# Patient Record
Sex: Male | Born: 1945
Health system: Southern US, Community
[De-identification: ages and names within clinical notes are randomized; demographics above are authoritative.]

## PROBLEM LIST (undated history)

## (undated) DIAGNOSIS — I4891 Unspecified atrial fibrillation: Secondary | ICD-10-CM

## (undated) DIAGNOSIS — M1990 Unspecified osteoarthritis, unspecified site: Secondary | ICD-10-CM

## (undated) DIAGNOSIS — E785 Hyperlipidemia, unspecified: Secondary | ICD-10-CM

## (undated) DIAGNOSIS — H8109 Meniere's disease, unspecified ear: Secondary | ICD-10-CM

## (undated) DIAGNOSIS — I639 Cerebral infarction, unspecified: Secondary | ICD-10-CM

## (undated) DIAGNOSIS — H269 Unspecified cataract: Secondary | ICD-10-CM

## (undated) DIAGNOSIS — R002 Palpitations: Secondary | ICD-10-CM

## (undated) DIAGNOSIS — E669 Obesity, unspecified: Secondary | ICD-10-CM

## (undated) DIAGNOSIS — I499 Cardiac arrhythmia, unspecified: Secondary | ICD-10-CM

## (undated) DIAGNOSIS — G4730 Sleep apnea, unspecified: Secondary | ICD-10-CM

## (undated) DIAGNOSIS — I10 Essential (primary) hypertension: Secondary | ICD-10-CM

## (undated) DIAGNOSIS — Z9989 Dependence on other enabling machines and devices: Secondary | ICD-10-CM

## (undated) DIAGNOSIS — C4442 Squamous cell carcinoma of skin of scalp and neck: Secondary | ICD-10-CM

## (undated) HISTORY — DX: Palpitations: R00.2

## (undated) HISTORY — PX: KNEE ARTHROSCOPY: SHX127

## (undated) HISTORY — DX: Gilbert syndrome: E80.4

## (undated) HISTORY — DX: Unspecified atrial fibrillation: I48.91

## (undated) HISTORY — DX: Dependence on other enabling machines and devices: Z99.89

## (undated) HISTORY — DX: Unspecified cataract: H26.9

## (undated) HISTORY — DX: Squamous cell carcinoma of skin of scalp and neck: C44.42

## (undated) HISTORY — DX: Hyperlipidemia, unspecified: E78.5

## (undated) HISTORY — DX: Cerebral infarction, unspecified: I63.9

## (undated) HISTORY — DX: Obesity, unspecified: E66.9

---

## 2000-08-17 ENCOUNTER — Ambulatory Visit (HOSPITAL_BASED_OUTPATIENT_CLINIC_OR_DEPARTMENT_OTHER): Admission: RE | Admit: 2000-08-17 | Discharge: 2000-08-17 | Payer: Self-pay | Admitting: Orthopedic Surgery

## 2004-09-14 ENCOUNTER — Encounter: Payer: Self-pay | Admitting: Cardiovascular Disease

## 2004-09-14 DIAGNOSIS — G4730 Sleep apnea, unspecified: Secondary | ICD-10-CM

## 2004-09-14 HISTORY — DX: Sleep apnea, unspecified: G47.30

## 2004-11-12 ENCOUNTER — Encounter: Payer: Self-pay | Admitting: Cardiovascular Disease

## 2005-11-01 ENCOUNTER — Encounter: Admission: RE | Admit: 2005-11-01 | Discharge: 2005-11-01 | Payer: Self-pay | Admitting: Cardiovascular Disease

## 2005-11-03 ENCOUNTER — Ambulatory Visit (HOSPITAL_COMMUNITY): Admission: RE | Admit: 2005-11-03 | Discharge: 2005-11-03 | Payer: Self-pay | Admitting: Cardiovascular Disease

## 2005-11-03 HISTORY — PX: CARDIAC CATHETERIZATION: SHX172

## 2010-05-29 DIAGNOSIS — I10 Essential (primary) hypertension: Secondary | ICD-10-CM

## 2010-05-29 HISTORY — DX: Essential (primary) hypertension: I10

## 2011-05-25 ENCOUNTER — Encounter (HOSPITAL_COMMUNITY): Payer: Self-pay | Admitting: Pharmacy Technician

## 2011-05-29 NOTE — H&P (Signed)
Clayton [name] is an 66 y.o. male.    Chief Complaint: left hip OA and pain   HPI: Pt is a 66 y.o. male complaining of left hip pain for 4+ years. Pain had continually increased since the beginning, especially for the last 1.5 years. X-rays in the clinic show end-stage arthritic changes of the left hip. Pt has tried various conservative treatments which have failed to alleviate their symptoms, including a steroid injection which helps for only a couple of months. Various options are discussed with the patient. Risks, benefits and expectations were discussed with the patient. Patient understand the risks, benefits and expectations and wishes to proceed with surgery.   PCP:  No primary provider on file.  D/C Plans:  Home with HHPT  Post-op Meds:  Rx given for ASA, Robaxin, Iron, Colace and MiraLax  Tranexamic Acid:   To be given  Decadron:   To be given  PMH: HTN Sleep Apnea Hyperlipidemia Impaired hearing Arthritis  PSH: Left knee arthroscopy  Social History: Denies the use of tobacco, Admits to the occasional use of alcohol  Allergies:  No Known Allergies  Medications: Current Outpatient Prescriptions  Medication Sig Dispense Refill  . amLODipine-benazepril (LOTREL) 10-40 MG per capsule Take 1 capsule by mouth daily.      Marland Kitchen aspirin 325 MG tablet Take 325 mg by mouth daily.      . Boswellia-Glucosamine-Vit D (GLUCOSAMINE COMPLEX PO) Take by mouth.      . hydrochlorothiazide (HYDRODIURIL) 25 MG tablet Take 25 mg by mouth daily.      . meloxicam (MOBIC) 15 MG tablet Take 15 mg by mouth daily.      . metoprolol succinate (TOPROL-XL) 100 MG 24 hr tablet Take 100 mg by mouth daily. Take with or immediately following a meal.      . Multiple Vitamin (MULITIVITAMIN WITH MINERALS) TABS Take 1 tablet by mouth daily.      Clayton Ards 3-6-9 Fatty Acids (OMEGA-3-6-9 PO) Take 1 capsule by mouth daily. Omega 3= 800mg ,omega 6=380mg ,omega 9 =300mg       . vitamin C (ASCORBIC ACID) 500 MG tablet  Take 500 mg by mouth daily.      Marland Kitchen zinc gluconate 50 MG tablet Take 50 mg by mouth daily.      Marland Kitchen Zn-Pyg Afri-Nettle-Saw Palmet (SAW PALMETTO COMPLEX PO) Take 1 capsule by mouth daily.        ROS: Review of Systems  Constitutional: Negative.   HENT: Negative.   Eyes: Negative.   Respiratory: Negative.   Cardiovascular: Negative.   Gastrointestinal: Negative.   Genitourinary: Negative.   Musculoskeletal: Positive for joint pain.  Skin: Negative.   Neurological: Negative.   Endo/Heme/Allergies: Negative.   Psychiatric/Behavioral: Negative.      Physical Exam: BP:  155/98 ;  HR:  100  ;    Resp: 20   ; Physical Exam  Constitutional: He is oriented to person, place, and time and well-developed, well-nourished, and in no distress.  HENT:  Head: Normocephalic and atraumatic.  Nose: Nose normal.  Mouth/Throat: Oropharynx is clear and moist.  Eyes: Pupils are equal, round, and reactive to light.  Neck: Neck supple. No JVD present. No tracheal deviation present. No thyromegaly present.  Cardiovascular: Normal rate, regular rhythm, normal heart sounds and intact distal pulses.   Pulmonary/Chest: Effort normal and breath sounds normal.  Abdominal: Soft. There is no tenderness. There is no guarding.  Musculoskeletal:       Left hip: He exhibits decreased range of  motion, decreased strength, tenderness and bony tenderness. He exhibits no swelling, no crepitus and no laceration.  Lymphadenopathy:    He has no cervical adenopathy.  Neurological: He is alert and oriented to person, place, and time.  Skin: Skin is warm and dry.  Psychiatric: Affect normal.     Assessment/Plan Assessment: left hip OA and pain   Plan: Patient will undergo a left total hip arthroplasty, anterior approach on 06/08/2011 per Dr. Charlann Boxer at [facility]. Risks benefits and expectation were discussed with the patient. Patient understand risks, benefits and expectation and wishes to proceed.   Clayton [name]  Clayton [name]   PAC  05/29/2011, 6:53 PM

## 2011-06-01 ENCOUNTER — Ambulatory Visit (HOSPITAL_COMMUNITY)
Admission: RE | Admit: 2011-06-01 | Discharge: 2011-06-01 | Disposition: A | Discharge status: Home / Self Care | Payer: BC Managed Care – PPO | Source: Ambulatory Visit | Attending: Orthopedic Surgery | Admitting: Orthopedic Surgery

## 2011-06-01 ENCOUNTER — Encounter (HOSPITAL_COMMUNITY)
Admission: RE | Admit: 2011-06-01 | Discharge: 2011-06-01 | Disposition: A | Discharge status: Home / Self Care | Payer: BC Managed Care – PPO | Source: Ambulatory Visit | Attending: Orthopedic Surgery | Admitting: Orthopedic Surgery

## 2011-06-01 ENCOUNTER — Encounter (HOSPITAL_COMMUNITY): Payer: Self-pay

## 2011-06-01 DIAGNOSIS — Z01818 Encounter for other preprocedural examination: Secondary | ICD-10-CM | POA: Insufficient documentation

## 2011-06-01 DIAGNOSIS — M1610 Unilateral primary osteoarthritis, unspecified hip: Secondary | ICD-10-CM | POA: Insufficient documentation

## 2011-06-01 DIAGNOSIS — M169 Osteoarthritis of hip, unspecified: Secondary | ICD-10-CM | POA: Insufficient documentation

## 2011-06-01 DIAGNOSIS — Z01812 Encounter for preprocedural laboratory examination: Secondary | ICD-10-CM | POA: Insufficient documentation

## 2011-06-01 HISTORY — DX: Unspecified osteoarthritis, unspecified site: M19.90

## 2011-06-01 HISTORY — DX: Sleep apnea, unspecified: G47.30

## 2011-06-01 HISTORY — DX: Essential (primary) hypertension: I10

## 2011-06-01 HISTORY — DX: Meniere's disease, unspecified ear: H81.09

## 2011-06-01 HISTORY — DX: Cardiac arrhythmia, unspecified: I49.9

## 2011-06-01 LAB — DIFFERENTIAL
Lymphocytes Relative: 21 % (ref 12–46)
Lymphs Abs: 1.2 10*3/uL (ref 0.7–4.0)
Monocytes Relative: 9 % (ref 3–12)
Neutrophils Relative %: 68 % (ref 43–77)

## 2011-06-01 LAB — URINALYSIS, ROUTINE W REFLEX MICROSCOPIC
Leukocytes, UA: NEGATIVE
Nitrite: NEGATIVE
Specific Gravity, Urine: 1.019 (ref 1.005–1.030)
Urobilinogen, UA: 0.2 mg/dL (ref 0.0–1.0)
pH: 7 (ref 5.0–8.0)

## 2011-06-01 LAB — BASIC METABOLIC PANEL
CO2: 27 mEq/L (ref 19–32)
Calcium: 9.5 mg/dL (ref 8.4–10.5)
Glucose, Bld: 117 mg/dL — ABNORMAL HIGH (ref 70–99)
Potassium: 3.4 mEq/L — ABNORMAL LOW (ref 3.5–5.1)
Sodium: 139 mEq/L (ref 135–145)

## 2011-06-01 LAB — CBC
Hemoglobin: 16 g/dL (ref 13.0–17.0)
MCV: 86.6 fL (ref 78.0–100.0)
Platelets: 211 10*3/uL (ref 150–400)
RBC: 5.16 MIL/uL (ref 4.22–5.81)
WBC: 5.6 10*3/uL (ref 4.0–10.5)

## 2011-06-01 LAB — PROTIME-INR
INR: 1.02 (ref 0.00–1.49)
Prothrombin Time: 13.6 seconds (ref 11.6–15.2)

## 2011-06-01 LAB — SURGICAL PCR SCREEN
MRSA, PCR: NEGATIVE
Staphylococcus aureus: NEGATIVE

## 2011-06-01 MED ORDER — CEFAZOLIN SODIUM 1-5 GM-% IV SOLN: 1.0000 g | INTRAVENOUS | Status: DC

## 2011-06-01 NOTE — Pre-Procedure Instructions (Signed)
Last office visit note with Dr Tresa Endo- cardiologist- 6/12 on chart  EKG 07/08/10 on chart

## 2011-06-01 NOTE — Patient Instructions (Signed)
20 Clayton [name]  06/01/2011   Your procedure is scheduled on:  06/08/11 1200noon-13opm  Report to [facility] at 0930 AM.  Call this number if you have problems the morning of surgery: [phone]   Remember:   Do not eat food:After Midnight.  May have clear liquids:until Midnight .   Take these medicines the morning of surgery with A SIP OF WATER:    Do not wear jewelry,   Do not wear lotions, powders, or perfumes.   Do not shave 48 hours prior to surgery. Men may shave face and neck.  Do not bring valuables to the hospital.  Contacts, dentures or bridgework may not be worn into surgery.  Leave suitcase in the car. After surgery it may be brought to your room.  For patients admitted to the hospital, checkout time is 11:00 AM the day of discharge.      Special Instructions: CHG Shower Use Special Wash: 1/2 bottle night before surgery and 1/2 bottle morning of surgery. shower chin to toes with CHG.  Wash face and private parts with regular soap.    Please read over the following fact sheets that you were given: MRSA Information, coughing and deep breathin exercises, leg exercises, Blood Transfusion Fact Sheet, Incentive Spirometry Fact Sheet

## 2011-06-01 NOTE — Pre-Procedure Instructions (Signed)
Sleep Study Report done 11/12/2004 on chart with settings. Placed under Cardiology Tab.  ECHO 05/29/10 on chart

## 2011-06-01 NOTE — Pre-Procedure Instructions (Signed)
06/01/11 Faxed and received confirmation to office for Dr Charlann Boxer to note abnormal BMET, U/A and micro results.

## 2011-06-08 ENCOUNTER — Ambulatory Visit (HOSPITAL_COMMUNITY): Payer: BC Managed Care – PPO

## 2011-06-08 ENCOUNTER — Encounter (HOSPITAL_COMMUNITY): Payer: Self-pay | Admitting: Certified Registered Nurse Anesthetist

## 2011-06-08 ENCOUNTER — Ambulatory Visit (HOSPITAL_COMMUNITY): Payer: BC Managed Care – PPO | Admitting: Certified Registered Nurse Anesthetist

## 2011-06-08 ENCOUNTER — Encounter (HOSPITAL_COMMUNITY): Payer: Self-pay | Admitting: *Deleted

## 2011-06-08 ENCOUNTER — Encounter (HOSPITAL_COMMUNITY)
Admission: RE | Disposition: A | Discharge status: Home Health | Payer: Self-pay | Source: Ambulatory Visit | Attending: Orthopedic Surgery

## 2011-06-08 ENCOUNTER — Inpatient Hospital Stay (HOSPITAL_COMMUNITY)
Admission: RE | Admit: 2011-06-08 | Discharge: 2011-06-10 | DRG: 818 | Disposition: A | Discharge status: Home Health | Payer: BC Managed Care – PPO | Source: Ambulatory Visit | Attending: Orthopedic Surgery | Admitting: Orthopedic Surgery

## 2011-06-08 DIAGNOSIS — E785 Hyperlipidemia, unspecified: Secondary | ICD-10-CM | POA: Diagnosis present

## 2011-06-08 DIAGNOSIS — M169 Osteoarthritis of hip, unspecified: Principal | ICD-10-CM | POA: Diagnosis present

## 2011-06-08 DIAGNOSIS — I10 Essential (primary) hypertension: Secondary | ICD-10-CM | POA: Diagnosis present

## 2011-06-08 DIAGNOSIS — H9190 Unspecified hearing loss, unspecified ear: Secondary | ICD-10-CM | POA: Diagnosis present

## 2011-06-08 DIAGNOSIS — M1610 Unilateral primary osteoarthritis, unspecified hip: Principal | ICD-10-CM | POA: Diagnosis present

## 2011-06-08 DIAGNOSIS — G4730 Sleep apnea, unspecified: Secondary | ICD-10-CM | POA: Diagnosis present

## 2011-06-08 DIAGNOSIS — Z96649 Presence of unspecified artificial hip joint: Secondary | ICD-10-CM

## 2011-06-08 HISTORY — PX: TOTAL HIP ARTHROPLASTY: SHX124

## 2011-06-08 LAB — URINALYSIS, ROUTINE W REFLEX MICROSCOPIC
Glucose, UA: NEGATIVE mg/dL
Hgb urine dipstick: NEGATIVE
Leukocytes, UA: NEGATIVE
Specific Gravity, Urine: 1.031 — ABNORMAL HIGH (ref 1.005–1.030)
pH: 6 (ref 5.0–8.0)

## 2011-06-08 LAB — ABO/RH: ABO/RH(D): O POS

## 2011-06-08 LAB — TYPE AND SCREEN: ABO/RH(D): O POS

## 2011-06-08 SURGERY — ARTHROPLASTY, HIP, TOTAL, ANTERIOR APPROACH
Anesthesia: General | Site: Hip | Laterality: Left | Wound class: Clean

## 2011-06-08 MED ORDER — ESMOLOL HCL 10 MG/ML IV SOLN
INTRAVENOUS | Status: DC | PRN
  Administered 2011-06-08: 10 mg via INTRAVENOUS

## 2011-06-08 MED ORDER — ONDANSETRON HCL 4 MG/2ML IJ SOLN
INTRAMUSCULAR | Status: DC | PRN
  Administered 2011-06-08: 4 mg via INTRAVENOUS

## 2011-06-08 MED ORDER — PROMETHAZINE HCL 25 MG/ML IJ SOLN: 6.2500 mg | INTRAMUSCULAR | Status: DC | PRN

## 2011-06-08 MED ORDER — LIDOCAINE HCL (CARDIAC) 20 MG/ML IV SOLN
INTRAVENOUS | Status: DC | PRN
  Administered 2011-06-08: 100 mg via INTRAVENOUS

## 2011-06-08 MED ORDER — METOCLOPRAMIDE HCL 5 MG/ML IJ SOLN: 5.0000 mg | Freq: Three times a day (TID) | INTRAMUSCULAR | Status: DC | PRN

## 2011-06-08 MED ORDER — ACETAMINOPHEN 10 MG/ML IV SOLN
INTRAVENOUS | Status: AC
  Filled 2011-06-08: qty 100

## 2011-06-08 MED ORDER — FLEET ENEMA 7-19 GM/118ML RE ENEM: 1.0000 | ENEMA | Freq: Once | RECTAL | Status: AC | PRN

## 2011-06-08 MED ORDER — HETASTARCH-ELECTROLYTES 6 % IV SOLN
INTRAVENOUS | Status: DC | PRN
  Administered 2011-06-08: 13:00:00 via INTRAVENOUS

## 2011-06-08 MED ORDER — ONDANSETRON HCL 4 MG PO TABS: 4.0000 mg | ORAL_TABLET | Freq: Four times a day (QID) | ORAL | Status: DC | PRN

## 2011-06-08 MED ORDER — DEXAMETHASONE SODIUM PHOSPHATE 10 MG/ML IJ SOLN
10.0000 mg | Freq: Once | INTRAMUSCULAR | Status: AC
  Administered 2011-06-09: 10 mg via INTRAVENOUS
  Filled 2011-06-08: qty 1

## 2011-06-08 MED ORDER — DIPHENHYDRAMINE HCL 25 MG PO CAPS: 25.0000 mg | ORAL_CAPSULE | Freq: Four times a day (QID) | ORAL | Status: DC | PRN

## 2011-06-08 MED ORDER — DEXAMETHASONE SODIUM PHOSPHATE 4 MG/ML IJ SOLN
INTRAMUSCULAR | Status: DC | PRN
  Administered 2011-06-08: 10 mg via INTRAVENOUS

## 2011-06-08 MED ORDER — POLYETHYLENE GLYCOL 3350 17 G PO PACK
17.0000 g | PACK | Freq: Two times a day (BID) | ORAL | Status: DC
  Administered 2011-06-08 – 2011-06-10 (×4): 17 g via ORAL

## 2011-06-08 MED ORDER — HYDROCODONE-ACETAMINOPHEN 7.5-325 MG PO TABS
1.0000 | ORAL_TABLET | ORAL | Status: DC
  Administered 2011-06-08: 1 via ORAL
  Administered 2011-06-08 – 2011-06-09 (×2): 2 via ORAL
  Filled 2011-06-08 (×2): qty 2
  Filled 2011-06-08: qty 1

## 2011-06-08 MED ORDER — ALUM & MAG HYDROXIDE-SIMETH 200-200-20 MG/5ML PO SUSP: 30.0000 mL | ORAL | Status: DC | PRN

## 2011-06-08 MED ORDER — ACETAMINOPHEN 10 MG/ML IV SOLN
INTRAVENOUS | Status: DC | PRN
  Administered 2011-06-08: 1000 mg via INTRAVENOUS

## 2011-06-08 MED ORDER — MIDAZOLAM HCL 5 MG/5ML IJ SOLN
INTRAMUSCULAR | Status: DC | PRN
  Administered 2011-06-08: 2 mg via INTRAVENOUS

## 2011-06-08 MED ORDER — KETAMINE HCL 10 MG/ML IJ SOLN
INTRAMUSCULAR | Status: DC | PRN
  Administered 2011-06-08: 20 mg via INTRAVENOUS
  Administered 2011-06-08: 30 mg via INTRAVENOUS

## 2011-06-08 MED ORDER — HYDROMORPHONE HCL PF 1 MG/ML IJ SOLN
INTRAMUSCULAR | Status: DC | PRN
  Administered 2011-06-08 (×4): 0.5 mg via INTRAVENOUS

## 2011-06-08 MED ORDER — METOPROLOL SUCCINATE ER 100 MG PO TB24
100.0000 mg | ORAL_TABLET | Freq: Every day | ORAL | Status: DC
  Administered 2011-06-08 – 2011-06-10 (×3): 100 mg via ORAL
  Filled 2011-06-08 (×3): qty 1

## 2011-06-08 MED ORDER — PROPOFOL 10 MG/ML IV EMUL
INTRAVENOUS | Status: DC | PRN
  Administered 2011-06-08: 200 mg via INTRAVENOUS

## 2011-06-08 MED ORDER — DOCUSATE SODIUM 100 MG PO CAPS
100.0000 mg | ORAL_CAPSULE | Freq: Two times a day (BID) | ORAL | Status: DC
  Administered 2011-06-08 – 2011-06-10 (×4): 100 mg via ORAL

## 2011-06-08 MED ORDER — METOCLOPRAMIDE HCL 10 MG PO TABS: 5.0000 mg | ORAL_TABLET | Freq: Three times a day (TID) | ORAL | Status: DC | PRN

## 2011-06-08 MED ORDER — LACTATED RINGERS IV SOLN
INTRAVENOUS | Status: DC | PRN
  Administered 2011-06-08 (×2): via INTRAVENOUS

## 2011-06-08 MED ORDER — NEOSTIGMINE METHYLSULFATE 1 MG/ML IJ SOLN
INTRAMUSCULAR | Status: DC | PRN
  Administered 2011-06-08: 2 mg via INTRAVENOUS
  Administered 2011-06-08: 3 mg via INTRAVENOUS

## 2011-06-08 MED ORDER — RIVAROXABAN 10 MG PO TABS
10.0000 mg | ORAL_TABLET | ORAL | Status: DC
  Administered 2011-06-09 – 2011-06-10 (×2): 10 mg via ORAL
  Filled 2011-06-08 (×3): qty 1

## 2011-06-08 MED ORDER — PHENYLEPHRINE HCL 10 MG/ML IJ SOLN
INTRAMUSCULAR | Status: DC | PRN
  Administered 2011-06-08 (×2): 120 ug via INTRAVENOUS
  Administered 2011-06-08 (×3): 80 ug via INTRAVENOUS
  Administered 2011-06-08 (×4): 40 ug via INTRAVENOUS

## 2011-06-08 MED ORDER — CEFAZOLIN SODIUM-DEXTROSE 2-3 GM-% IV SOLR
2.0000 g | Freq: Four times a day (QID) | INTRAVENOUS | Status: AC
  Administered 2011-06-08 – 2011-06-09 (×3): 2 g via INTRAVENOUS
  Filled 2011-06-08 (×3): qty 50

## 2011-06-08 MED ORDER — ZOLPIDEM TARTRATE 5 MG PO TABS: 5.0000 mg | ORAL_TABLET | Freq: Every evening | ORAL | Status: DC | PRN

## 2011-06-08 MED ORDER — PHENYLEPHRINE HCL 10 MG/ML IJ SOLN
10.0000 mg | INTRAVENOUS | Status: DC | PRN
  Administered 2011-06-08: 50 ug/min via INTRAVENOUS

## 2011-06-08 MED ORDER — PHENOL 1.4 % MT LIQD
1.0000 | OROMUCOSAL | Status: DC | PRN
  Filled 2011-06-08: qty 177

## 2011-06-08 MED ORDER — CEFAZOLIN SODIUM-DEXTROSE 2-3 GM-% IV SOLR
INTRAVENOUS | Status: AC
  Filled 2011-06-08: qty 50

## 2011-06-08 MED ORDER — CHLORHEXIDINE GLUCONATE 4 % EX LIQD
60.0000 mL | Freq: Once | CUTANEOUS | Status: DC
  Filled 2011-06-08: qty 60

## 2011-06-08 MED ORDER — FENTANYL CITRATE 0.05 MG/ML IJ SOLN
INTRAMUSCULAR | Status: DC | PRN
  Administered 2011-06-08: 25 ug via INTRAVENOUS
  Administered 2011-06-08: 50 ug via INTRAVENOUS
  Administered 2011-06-08: 25 ug via INTRAVENOUS
  Administered 2011-06-08 (×2): 50 ug via INTRAVENOUS
  Administered 2011-06-08 (×3): 25 ug via INTRAVENOUS
  Administered 2011-06-08: 50 ug via INTRAVENOUS
  Administered 2011-06-08: 25 ug via INTRAVENOUS

## 2011-06-08 MED ORDER — DEXTROSE 5 % IV SOLN
500.0000 mg | Freq: Four times a day (QID) | INTRAVENOUS | Status: DC | PRN
  Administered 2011-06-08: 500 mg via INTRAVENOUS
  Filled 2011-06-08: qty 5

## 2011-06-08 MED ORDER — SODIUM CHLORIDE 0.9 % IV SOLN
100.0000 mL/h | INTRAVENOUS | Status: AC
  Administered 2011-06-08 – 2011-06-09 (×2): 100 mL/h via INTRAVENOUS
  Filled 2011-06-08 (×9): qty 1000

## 2011-06-08 MED ORDER — TRANEXAMIC ACID 100 MG/ML IV SOLN
15.0000 mg/kg | Freq: Once | INTRAVENOUS | Status: AC
  Administered 2011-06-08: 1788 mg via INTRAVENOUS
  Filled 2011-06-08: qty 17.88

## 2011-06-08 MED ORDER — BISACODYL 5 MG PO TBEC: 5.0000 mg | DELAYED_RELEASE_TABLET | Freq: Every day | ORAL | Status: DC | PRN

## 2011-06-08 MED ORDER — HYDROMORPHONE HCL PF 1 MG/ML IJ SOLN: 0.5000 mg | INTRAMUSCULAR | Status: DC | PRN

## 2011-06-08 MED ORDER — HYDROMORPHONE HCL PF 1 MG/ML IJ SOLN
INTRAMUSCULAR | Status: AC
  Filled 2011-06-08: qty 1

## 2011-06-08 MED ORDER — ROCURONIUM BROMIDE 100 MG/10ML IV SOLN
INTRAVENOUS | Status: DC | PRN
  Administered 2011-06-08: 10 mg via INTRAVENOUS
  Administered 2011-06-08: 50 mg via INTRAVENOUS

## 2011-06-08 MED ORDER — HYDROMORPHONE HCL PF 1 MG/ML IJ SOLN
0.2500 mg | INTRAMUSCULAR | Status: DC | PRN
  Administered 2011-06-08 (×2): 0.5 mg via INTRAVENOUS

## 2011-06-08 MED ORDER — HYDROCHLOROTHIAZIDE 25 MG PO TABS
25.0000 mg | ORAL_TABLET | Freq: Every day | ORAL | Status: DC
  Administered 2011-06-08 – 2011-06-09 (×2): 25 mg via ORAL
  Filled 2011-06-08 (×3): qty 1

## 2011-06-08 MED ORDER — LACTATED RINGERS IV SOLN
INTRAVENOUS | Status: DC
  Administered 2011-06-08: 1000 mL via INTRAVENOUS

## 2011-06-08 MED ORDER — ONDANSETRON HCL 4 MG/2ML IJ SOLN: 4.0000 mg | Freq: Four times a day (QID) | INTRAMUSCULAR | Status: DC | PRN

## 2011-06-08 MED ORDER — CEFAZOLIN SODIUM-DEXTROSE 2-3 GM-% IV SOLR
2.0000 g | Freq: Once | INTRAVENOUS | Status: AC
  Administered 2011-06-08: 2 g via INTRAVENOUS

## 2011-06-08 MED ORDER — EPHEDRINE SULFATE 50 MG/ML IJ SOLN
INTRAMUSCULAR | Status: DC | PRN
  Administered 2011-06-08 (×3): 10 mg via INTRAVENOUS
  Administered 2011-06-08: 5 mg via INTRAVENOUS

## 2011-06-08 MED ORDER — GLYCOPYRROLATE 0.2 MG/ML IJ SOLN
INTRAMUSCULAR | Status: DC | PRN
  Administered 2011-06-08: 0.1 mg via INTRAVENOUS
  Administered 2011-06-08: 0.2 mg via INTRAVENOUS
  Administered 2011-06-08: 0.6 mg via INTRAVENOUS

## 2011-06-08 MED ORDER — FERROUS SULFATE 325 (65 FE) MG PO TABS
325.0000 mg | ORAL_TABLET | Freq: Three times a day (TID) | ORAL | Status: DC
  Administered 2011-06-08 – 2011-06-10 (×5): 325 mg via ORAL
  Filled 2011-06-08 (×8): qty 1

## 2011-06-08 MED ORDER — METHOCARBAMOL 500 MG PO TABS
500.0000 mg | ORAL_TABLET | Freq: Four times a day (QID) | ORAL | Status: DC | PRN
  Administered 2011-06-09 – 2011-06-10 (×2): 500 mg via ORAL
  Filled 2011-06-08 (×2): qty 1

## 2011-06-08 MED ORDER — MENTHOL 3 MG MT LOZG
1.0000 | LOZENGE | OROMUCOSAL | Status: DC | PRN
  Filled 2011-06-08: qty 9

## 2011-06-08 MED ORDER — SUCCINYLCHOLINE CHLORIDE 20 MG/ML IJ SOLN
INTRAMUSCULAR | Status: DC | PRN
  Administered 2011-06-08: 100 mg via INTRAVENOUS

## 2011-06-08 SURGICAL SUPPLY — 39 items
BAG ZIPLOCK 12X15 (MISCELLANEOUS) ×4 IMPLANT
BLADE SAW SGTL 18X1.27X75 (BLADE) ×2 IMPLANT
CELLS DAT CNTRL 66122 CELL SVR (MISCELLANEOUS) IMPLANT
CLOTH BEACON ORANGE TIMEOUT ST (SAFETY) ×2 IMPLANT
DERMABOND ADVANCED (GAUZE/BANDAGES/DRESSINGS) ×1
DERMABOND ADVANCED .7 DNX12 (GAUZE/BANDAGES/DRESSINGS) ×1 IMPLANT
DRAPE C-ARM 42X72 X-RAY (DRAPES) ×2 IMPLANT
DRAPE STERI IOBAN 125X83 (DRAPES) ×2 IMPLANT
DRAPE U-SHAPE 47X51 STRL (DRAPES) ×6 IMPLANT
DRSG AQUACEL AG ADV 3.5X10 (GAUZE/BANDAGES/DRESSINGS) ×2 IMPLANT
DRSG TEGADERM 4X4.75 (GAUZE/BANDAGES/DRESSINGS) ×4 IMPLANT
DURAPREP 26ML APPLICATOR (WOUND CARE) ×2 IMPLANT
ELECT BLADE TIP CTD 4 INCH (ELECTRODE) ×2 IMPLANT
ELECT REM PT RETURN 9FT ADLT (ELECTROSURGICAL) ×2
ELECTRODE REM PT RTRN 9FT ADLT (ELECTROSURGICAL) ×1 IMPLANT
EVACUATOR 1/8 PVC DRAIN (DRAIN) ×2 IMPLANT
FACESHIELD LNG OPTICON STERILE (SAFETY) ×8 IMPLANT
GAUZE SPONGE 2X2 8PLY STRL LF (GAUZE/BANDAGES/DRESSINGS) ×1 IMPLANT
GLOVE BIOGEL PI IND STRL 7.5 (GLOVE) ×1 IMPLANT
GLOVE BIOGEL PI IND STRL 8 (GLOVE) ×1 IMPLANT
GLOVE BIOGEL PI INDICATOR 7.5 (GLOVE) ×1
GLOVE BIOGEL PI INDICATOR 8 (GLOVE) ×1
GLOVE ECLIPSE 8.0 STRL XLNG CF (GLOVE) ×2 IMPLANT
GLOVE ORTHO TXT STRL SZ7.5 (GLOVE) ×4 IMPLANT
GOWN BRE IMP PREV XXLGXLNG (GOWN DISPOSABLE) IMPLANT
GOWN STRL NON-REIN LRG LVL3 (GOWN DISPOSABLE) IMPLANT
KIT BASIN OR (CUSTOM PROCEDURE TRAY) ×2 IMPLANT
PACK TOTAL JOINT (CUSTOM PROCEDURE TRAY) ×2 IMPLANT
PADDING CAST COTTON 6X4 STRL (CAST SUPPLIES) ×2 IMPLANT
RTRCTR WOUND ALEXIS 18CM MED (MISCELLANEOUS)
SPONGE GAUZE 2X2 STER 10/PKG (GAUZE/BANDAGES/DRESSINGS) ×1
SUCTION FRAZIER 12FR DISP (SUCTIONS) IMPLANT
SUT MNCRL AB 4-0 PS2 18 (SUTURE) ×2 IMPLANT
SUT VIC AB 1 CT1 36 (SUTURE) ×6 IMPLANT
SUT VIC AB 2-0 CT1 27 (SUTURE) ×2
SUT VIC AB 2-0 CT1 TAPERPNT 27 (SUTURE) ×2 IMPLANT
SUT VLOC 180 0 24IN GS25 (SUTURE) ×2 IMPLANT
TOWEL OR 17X26 10 PK STRL BLUE (TOWEL DISPOSABLE) ×4 IMPLANT
TRAY FOLEY CATH 14FRSI W/METER (CATHETERS) ×2 IMPLANT

## 2011-06-08 NOTE — Anesthesia Postprocedure Evaluation (Signed)
  Anesthesia Post-op Note  Patient: Clayton [name]  Procedure(s) Performed: Procedure(s) (LRB): TOTAL HIP ARTHROPLASTY ANTERIOR APPROACH (Left)  Patient Location: PACU  Anesthesia Type: General  Level of Consciousness: awake and alert   Airway and Oxygen Therapy: Patient Spontanous Breathing  Post-op Pain: mild  Post-op Assessment: Post-op Vital signs reviewed, Patient's Cardiovascular Status Stable, Respiratory Function Stable, Patent Airway and No signs of Nausea or vomiting  Post-op Vital Signs: stable  Complications: No apparent anesthesia complications

## 2011-06-08 NOTE — Progress Notes (Signed)
Set up patient on CPAP for tonight with patients home mask and tubing. Pt is on 8cmh2o per patient home settings. Tolerating well at this time. RT will monitor.

## 2011-06-08 NOTE — Preoperative (Signed)
Beta Blockers   Reason not to administer Beta Blockers:Not Applicable, took BB last PM 

## 2011-06-08 NOTE — Interval H&P Note (Signed)
History and Physical Interval Note:  06/08/2011 10:30 AM  Clayton [name]  has presented today for surgery, with the diagnosis of osteoarthritis left hip  The various methods of treatment have been discussed with the patient and family. After consideration of risks, benefits and other options for treatment, the patient has consented to  Procedure(s) (LRB):LEFT TOTAL HIP ARTHROPLASTY ANTERIOR APPROACH (Left) as a surgical intervention .  The patients' history has been reviewed, patient examined, no change in status, stable for surgery.  I have reviewed the patients' chart and labs.  Questions were answered to the patient's satisfaction.     Shelda Pal

## 2011-06-08 NOTE — Anesthesia Preprocedure Evaluation (Addendum)
Anesthesia Evaluation  Patient identified by MRN, date of birth, ID band Patient awake    Reviewed: Allergy & Precautions, H&P , NPO status , Patient's Chart, lab work & pertinent test results  Airway Mallampati: II TM Distance: >3 FB Neck ROM: Full    Dental No notable dental hx.    Pulmonary sleep apnea ,  CXR: Hyperaeration and borderline cardiomegaly. breath sounds clear to auscultation  Pulmonary exam normal       Cardiovascular hypertension, Pt. on medications and Pt. on home beta blockers + dysrhythmias Rhythm:Regular Rate:Normal     Neuro/Psych negative neurological ROS  negative psych ROS   GI/Hepatic negative GI ROS, Neg liver ROS,   Endo/Other  negative endocrine ROS  Renal/GU negative Renal ROS  negative genitourinary   Musculoskeletal negative musculoskeletal ROS (+)   Abdominal   Peds negative pediatric ROS (+)  Hematology negative hematology ROS (+)   Anesthesia Other Findings   Reproductive/Obstetrics negative OB ROS                           Anesthesia Physical Anesthesia Plan  ASA: II  Anesthesia Plan: General   Post-op Pain Management:    Induction: Intravenous  Airway Management Planned: Oral ETT  Additional Equipment:   Intra-op Plan:   Post-operative Plan: Extubation in OR  Informed Consent: I have reviewed the patients History and Physical, chart, labs and discussed the procedure including the risks, benefits and alternatives for the proposed anesthesia with the patient or authorized representative who has indicated his/her understanding and acceptance.   Dental advisory given  Plan Discussed with: CRNA  Anesthesia Plan Comments: (Discussed general versus spinal. Prefers general.)       Anesthesia Quick Evaluation

## 2011-06-08 NOTE — Op Note (Signed)
NAME:  Clayton [name]                ACCOUNT NO.: [id number]      MEDICAL RECORD NO.: [id number]      FACILITY:  [facility]      PHYSICIAN:  Durene Romans D  DATE OF BIRTH:  1945/02/10     DATE OF PROCEDURE:  06/08/2011                                 OPERATIVE REPORT         PREOPERATIVE DIAGNOSIS: Left  hip osteoarthritis.      POSTOPERATIVE DIAGNOSIS:  Left hip osteoarthritis.      PROCEDURE:  Left total hip replacement through an anterior approach   utilizing DePuy THR system, component size 54mm pinnacle cup, a size 36+4 neutral   Altrex liner, a size 7Hi Tri Lock stem with a 36+5 delta ceramic   ball.      SURGEON:  Madlyn Frankel. Charlann Boxer, M.D.      ASSISTANT:  Lanney Gins, PA      ANESTHESIA:  General.      SPECIMENS:  None.      COMPLICATIONS:  None.      BLOOD LOSS:  400 cc     DRAINS:  One Hemovac.      INDICATION OF THE PROCEDURE:  TETSUO COPPOLA is a 66 y.o. male who had   presented to office for evaluation of left hip pain.  Radiographs revealed   progressive degenerative changes with bone-on-bone   articulation to the  hip joint.  The patient had painful limited range of   motion significantly affecting their overall quality of life.  The patient was failing to    respond to conservative measures, and at this point was ready   to proceed with more definitive measures.  The patient has noted progressive   degenerative changes in his hip, progressive problems and dysfunction   with regarding the hip prior to surgery.  Consent was obtained for   benefit of pain relief.  Specific risk of infection, DVT, component   failure, dislocation, need for revision surgery, as well discussion of   the anterior versus posterior approach were reviewed.  Consent was   obtained for benefit of anterior pain relief through an anterior   approach.      PROCEDURE IN DETAIL:  The patient was brought to operative theater.   Once adequate anesthesia, preoperative antibiotics, 2gm Ancef  administered.   The patient was positioned supine on the OSI Hanna table.  Once adequate   padding of boney process was carried out, we had predraped out the hip, and  used fluoroscopy to confirm orientation of the pelvis and position.      The left hip was then prepped and draped from proximal iliac crest to   mid thigh with shower curtain technique.      Time-out was performed identifying the patient, planned procedure, and   extremity.     An incision was then made 2 cm distal and lateral to the   anterior superior iliac spine extending over the orientation of the   tensor fascia lata muscle and sharp dissection was carried down to the   fascia of the muscle and protractor placed in the soft tissues.      The fascia was then incised.  The muscle belly was identified and swept   laterally  and retractor placed along the superior neck.  Following   cauterization of the circumflex vessels and removing some pericapsular   fat, a second cobra retractor was placed on the inferior neck.  A third   retractor was placed on the anterior acetabulum after elevating the   anterior rectus.  A L-capsulotomy was along the line of the   superior neck to the trochanteric fossa, then extended proximally and   distally.  Tag sutures were placed and the retractors were then placed   intracapsular.  We then identified the trochanteric fossa and   orientation of my neck cut, confirmed this radiographically   and then made a neck osteotomy with the femur on traction.  The femoral   head was removed without difficulty or complication.  Traction was let   off and retractors were placed posterior and anterior around the   acetabulum.      The labrum and foveal tissue were debrided.  I began reaming with a 49mm   reamer and reamed up to 53mm reamer with good bony bed preparation and a 54   cup was chosen.  The final 54mm Pinnacle cup was then impacted under fluoroscopy  to confirm the depth of penetration and  orientation with respect to   abduction.  A screw was placed followed by the hole eliminator.  The final   36+4 Altrex liner was impacted with good visualized rim fit.  The cup was positioned anatomically within the acetabular portion of the pelvis.      At this point, the femur was rolled at 80 degrees.  Further capsule was   released off the inferior aspect of the femoral neck.  I then   released the superior capsule proximally.  The hook was placed laterally   along the femur and elevated manually and held in position with the bed   hook.  The leg was then extended and adducted with the leg rolled to 100   degrees of external rotation.  Once the proximal femur was fully   exposed, I used a box osteotome to set orientation.  I then began   broaching with the starting chili pepper broach and passed this by hand and then broached up to 7.  With the 7 broach in place I chose a high offset neck and did a trial reduction with a 36+5 to match offset.  The offset was appropriate, leg lengths   appeared to be equal, confirmed radiographically.   Given these findings, I went ahead and dislocated the hip, repositioned all   retractors and positioned the right hip in the extended and abducted position.  The final 7 high offset Tri Lock stem was   chosen and it was impacted down to the level of neck cut.  Based on this   and the trial reduction, a 36+5 delta ceramic ball was chosen and   impacted onto a clean and dry trunnion, and the hip was reduced.  The   hip had been irrigated throughout the case again at this point.  I did   reapproximate the superior capsular leaflet to the anterior leaflet   using #1 Vicryl, placed a medium Hemovac drain deep.  The fascia of the   tensor fascia lata muscle was then reapproximated using #1 Vicryl.  The   remaining wound was closed with 2-0 Vicryl and running 4-0 Monocryl.   The hip was cleaned, dried, and dressed sterilely using Dermabond and   Aquacel dressing.   Drain site  dressed separately.  She was then brought   to recovery room in stable condition tolerating the procedure well.    Lanney Gins, PA-C was present for the entirety of the case involved from   preoperative positioning, perioperative retractor management, general   facilitation of the case, as well as primary wound closure as assistant.            Madlyn Frankel Charlann Boxer, M.D.            MDO/MEDQ  D:  11/10/2010  T:  11/10/2010  Job:  [id number]      Electronically Signed by Durene Romans M.D. on 11/16/2010 09:15:38 AM

## 2011-06-08 NOTE — Transfer of Care (Signed)
Immediate Anesthesia Transfer of Care Note  Patient: Clayton [name]  Procedure(s) Performed: Procedure(s) (LRB): TOTAL HIP ARTHROPLASTY ANTERIOR APPROACH (Left)  Patient Location: PACU  Anesthesia Type: General  Level of Consciousness: awake, patient cooperative, confused and responds to stimulation  Airway & Oxygen Therapy: Patient Spontanous Breathing and Patient connected to face mask oxygen  Post-op Assessment: Report given to PACU RN, Post -op Vital signs reviewed and stable and Patient moving all extremities  Post vital signs: Reviewed and stable  Complications: No apparent anesthesia complications

## 2011-06-09 ENCOUNTER — Encounter (HOSPITAL_COMMUNITY): Payer: Self-pay | Admitting: Orthopedic Surgery

## 2011-06-09 LAB — URINE CULTURE
Colony Count: NO GROWTH
Culture  Setup Time: 201305212351
Culture: NO GROWTH

## 2011-06-09 LAB — BASIC METABOLIC PANEL
BUN: 18 mg/dL (ref 6–23)
Calcium: 8.5 mg/dL (ref 8.4–10.5)
Creatinine, Ser: 1.29 mg/dL (ref 0.50–1.35)
Potassium: 3.7 mEq/L (ref 3.5–5.1)

## 2011-06-09 LAB — CBC
HCT: 35.8 % — ABNORMAL LOW (ref 39.0–52.0)
MCHC: 35.2 g/dL (ref 30.0–36.0)
RDW: 12.5 % (ref 11.5–15.5)

## 2011-06-09 MED ORDER — HYDROCODONE-ACETAMINOPHEN 7.5-325 MG PO TABS
1.0000 | ORAL_TABLET | ORAL | Status: DC
  Administered 2011-06-09: 2 via ORAL
  Administered 2011-06-09 – 2011-06-10 (×7): 1 via ORAL
  Filled 2011-06-09 (×6): qty 1
  Filled 2011-06-09 (×2): qty 2

## 2011-06-09 NOTE — Progress Notes (Signed)
Physical Therapy Treatment Patient Details Name: Clayton [name] MRN: [id number] DOB: Jul 04, 1945 Today's Date: 06/09/2011 Time: 6578-4696 PT Time Calculation (min): 15 min  PT Assessment / Plan / Recommendation Comments on Treatment Session  pt continues to  improve in mobility. Pt plans DC tomorrow.    Follow Up Recommendations  Home health PT    Barriers to Discharge        Equipment Recommendations       Recommendations for Other Services OT consult  Frequency     Plan Discharge plan remains appropriate    Precautions / Restrictions     Pertinent Vitals/Pain No pain    Mobility  Bed Mobility Bed Mobility: Sit to Supine Supine to Sit: 4: Min guard;With rails  Details for Bed Mobility Assistance: vc on how to turn on bed more to facilitate getting to edge Transfers Transfers: Sit to Stand;Stand to Sit Sit to Stand: From bed;4: Min guard Stand to Sit: To chair/3-in-1;5: Supervision Details for Transfer Assistance: VC for safety, pt did not have RW close prior to sitting. Ambulation/Gait Ambulation/Gait Assistance: 4: Min guard Ambulation Distance (Feet): 200 Feet Assistive device: Rolling walker Ambulation/Gait Assistance Details: VC to attempt to ER L leg , L leg tends to be IR Gait Pattern: Step-through pattern    Exercises     PT Diagnosis:    PT Problem List:   PT Treatment Interventions:     PT Goals Acute Rehab PT Goals Pt will go Supine/Side to Sit: with modified independence PT Goal: Supine/Side to Sit - Progress: Progressing toward goal Pt will go Sit to Supine/Side: Independently PT Goal: Sit to Supine/Side - Progress: Progressing toward goal Pt will go Sit to Stand: with modified independence PT Goal: Sit to Stand - Progress: Progressing toward goal Pt will go Stand to Sit: with modified independence PT Goal: Stand to Sit - Progress: Progressing toward goal Pt will Ambulate: >150 feet;with supervision;with rolling walker PT Goal: Ambulate -  Progress: Progressing toward goal Pt will Perform Home Exercise Program: with supervision, verbal cues required/provided PT Goal: Perform Home Exercise Program - Progress: Progressing toward goal  Visit Information  Last PT Received On: 06/09/11 Assistance Needed: +1    Subjective Data  Subjective: it is amazing how my hip does not hurt.   Cognition       Balance     End of Session PT - End of Session Activity Tolerance: Patient tolerated treatment well Patient left: in chair;with call bell/phone within reach    Rada Hay 06/09/2011, 4:57 PM

## 2011-06-09 NOTE — Progress Notes (Signed)
Came to check on pt for use of nighttime CPAP. Pt has already self administered and tolerating well. RT will assist as needed.

## 2011-06-09 NOTE — Progress Notes (Signed)
Utilization review completed.  

## 2011-06-09 NOTE — Evaluation (Signed)
Physical Therapy Evaluation Patient Details Name: Clayton [name] MRN: [id number] DOB: 10-Mar-1945 Today's Date: 06/09/2011 Time: 4098-1191 PT Time Calculation (min): 33 min  PT Assessment / Plan / Recommendation Clinical Impression  pt tolerated very well for first ambulation after LTHA direct anterior. pt will benefit from PT to improve in functional mobility, ROM, strength to DC to home tomorrow.    PT Assessment  Patient needs continued PT services    Follow Up Recommendations  Home health PT;Supervision/Assistance - 24 hour    Barriers to Discharge        lEquipment Recommendations  Rolling walker with 5" wheels;3 in 1 bedside comode    Recommendations for Other Services OT consult   Frequency 7X/week    Precautions / Restrictions Precautions Precautions: None   Pertinent Vitals/Pain Sore l hip      Mobility  Bed Mobility Bed Mobility: Supine to Sit Supine to Sit: 4: Min guard Details for Bed Mobility Assistance: VC on technique to move to R edge, Transfers Transfers: Sit to Stand;Stand to Sit Sit to Stand: 4: Min assist;From elevated surface;From bed;With upper extremity assist Stand to Sit: 4: Min assist;With upper extremity assist;To chair/3-in-1 Details for Transfer Assistance: VC for Lleg placed prior to sitting down. Ambulation/Gait Ambulation/Gait Assistance: 4: Min assist Ambulation Distance (Feet): 150 Feet Assistive device: Rolling walker Ambulation/Gait Assistance Details: pt tolerated very well. no difficulty advancing LLE Gait Pattern: Step-through pattern    Exercises Total Joint Exercises Ankle Circles/Pumps: AROM;Both;20 reps;Supine Quad Sets: AROM;Both;10 reps;Supine Short Arc Quad: AROM;Left;10 reps;Supine Heel Slides: AROM;Left;10 reps;Supine Hip ABduction/ADduction: AROM;Left;10 reps;Supine   PT Diagnosis: Difficulty walking  PT Problem List: Decreased strength;Decreased range of motion;Decreased knowledge of use of DME PT Treatment  Interventions: DME instruction;Gait training;Stair training;Functional mobility training;Therapeutic exercise;Patient/family education   PT Goals Acute Rehab PT Goals PT Goal Formulation: With patient Time For Goal Achievement: 06/11/11 Potential to Achieve Goals: Good Pt will go Supine/Side to Sit: Independently PT Goal: Supine/Side to Sit - Progress: Goal set today Pt will go Sit to Supine/Side: Independently PT Goal: Sit to Supine/Side - Progress: Goal set today Pt will go Sit to Stand: with modified independence PT Goal: Sit to Stand - Progress: Goal set today Pt will go Stand to Sit: with modified independence PT Goal: Stand to Sit - Progress: Goal set today Pt will Ambulate: >150 feet;with supervision;with least restrictive assistive device PT Goal: Ambulate - Progress: Goal set today Pt will Go Up / Down Stairs: 3-5 stairs;with supervision;with rail(s) PT Goal: Up/Down Stairs - Progress: Goal set today Pt will Perform Home Exercise Program: Independently PT Goal: Perform Home Exercise Program - Progress: Goal set today  Visit Information  Last PT Received On: 06/09/11 Assistance Needed: +1    Subjective Data  Subjective: it feels great Patient Stated Goal:  to get up and walk   Prior Functioning  Home Living Lives With: Spouse Available Help at Discharge: Family Type of Home: House Home Access: Stairs to enter Secretary/administrator of Steps: 3 Entrance Stairs-Rails: Right Home Layout: One level Firefighter: Standard Home Adaptive Equipment: None Prior Function Level of Independence: Independent Able to Take Stairs?: Yes Driving: Yes Vocation: Full time employment Communication Communication: No difficulties    Cognition  Overall Cognitive Status: Appears within functional limits for tasks assessed/performed Arousal/Alertness: Awake/alert Orientation Level: Appears intact for tasks assessed Behavior During Session: [facility] for tasks performed      Extremity/Trunk Assessment Right Lower Extremity Assessment RLE ROM/Strength/Tone: Within functional levels Left Lower Extremity  Assessment LLE ROM/Strength/Tone: Deficits LLE ROM/Strength/Tone Deficits: able to flex hip  while suoine w/ no assist., advances LLE during swing.   Balance    End of Session PT - End of Session Activity Tolerance: Patient tolerated treatment well Patient left: in chair;with call bell/phone within reach Nurse Communication: Mobility status   Rada Hay 06/09/2011, 10:35 AM  [phone]

## 2011-06-09 NOTE — Progress Notes (Signed)
Subjective: 1 Day Post-Op Procedure(s) (LRB): TOTAL HIP ARTHROPLASTY ANTERIOR APPROACH (Left)   Patient reports pain as mild, pain well controlled with medications. No events throughout the night.  Objective:   VITALS:   Filed Vitals:   06/09/11 0526  BP: 129/76  Pulse: 72  Temp: 98.8 F (37.1 C)  Resp: 16    Neurovascular intact Dorsiflexion/Plantar flexion intact Incision: dressing C/D/I No cellulitis present Compartment soft  LABS  Basename 06/09/11 0502  HGB 12.6*  HCT 35.8*  WBC 9.4  PLT 179     Basename 06/09/11 0502  NA 134*  K 3.7  BUN 18  CREATININE 1.29  GLUCOSE 131*     Assessment/Plan: 1 Day Post-Op Procedure(s) (LRB): TOTAL HIP ARTHROPLASTY ANTERIOR APPROACH (Left)   HV drain d/c'ed Foley cath d/c'ed Advance diet Up with therapy D/C IV fluids Plan for discharge tomorrow to home if continues to do well   Anastasio Auerbach. Tison Leibold   PAC  06/09/2011, 9:02 AM

## 2011-06-09 NOTE — Progress Notes (Signed)
Physical Therapy Treatment Patient Details Name: Clayton [name] MRN: [id number] DOB: June 26, 1945 Today's Date: 06/09/2011 Time: 7829-5621 PT Time Calculation (min): 14 min  PT Assessment / Plan / Recommendation Comments on Treatment Session  Instructed pt to work on active ER while supine. pt is progressing very well./    Follow Up Recommendations       Barriers to Discharge        Equipment Recommendations       Recommendations for Other Services    Frequency     Plan Discharge plan remains appropriate    Precautions / Restrictions     Pertinent Vitals/Pain Sore hip only    Mobility  Bed Mobility Bed Mobility: Sit to Supine Sit to Supine: 4: Min assist Details for Bed Mobility Assistance: support LLE onto bed. Transfers Transfers: Sit to Stand;Stand to Sit Sit to Stand: From chair/3-in-1;4: Min guard Stand to Sit: To bed;To elevated surface;5: Supervision Ambulation/Gait Ambulation/Gait Assistance: 4: Min guard Ambulation Distance (Feet): 200 Feet Assistive device: Rolling walker Ambulation/Gait Assistance Details: VC to attempt to ER L leg , L leg tends to be IR this is not a precaution, just that L foot is close to r during swing Gait Pattern: Step-through pattern    Exercises  instructed in ER of L hip exercises.   PT Diagnosis:    PT Problem List:   PT Treatment Interventions:     PT Goals Acute Rehab PT Goals Pt will go Sit to Supine/Side: Independently PT Goal: Sit to Supine/Side - Progress: Progressing toward goal Pt will go Sit to Stand: with modified independence PT Goal: Sit to Stand - Progress: Progressing toward goal Pt will go Stand to Sit: with modified independence PT Goal: Stand to Sit - Progress: Progressing toward goal Pt will Ambulate: >150 feet;with supervision;with rolling walker PT Goal: Ambulate - Progress: Progressing toward goal Pt will Perform Home Exercise Program: with supervision, verbal cues required/provided PT Goal: Perform  Home Exercise Program - Progress: Progressing toward goal  Visit Information  Last PT Received On: 06/09/11 Assistance Needed: +1    Subjective Data  Subjective: my hip feels better.   Cognition       Balance     End of Session PT - End of Session Activity Tolerance: Patient tolerated treatment well Patient left: in bed;with call bell/phone within reach    Rada Hay 06/09/2011, 4:50 PM

## 2011-06-10 LAB — BASIC METABOLIC PANEL
BUN: 20 mg/dL (ref 6–23)
CO2: 27 mEq/L (ref 19–32)
Chloride: 99 mEq/L (ref 96–112)
Creatinine, Ser: 1.32 mg/dL (ref 0.50–1.35)
GFR calc Af Amer: 64 mL/min — ABNORMAL LOW (ref >90)
Glucose, Bld: 131 mg/dL — ABNORMAL HIGH (ref 70–99)
Potassium: 3.7 mEq/L (ref 3.5–5.1)

## 2011-06-10 LAB — CBC
HCT: 33.5 % — ABNORMAL LOW (ref 39.0–52.0)
Hemoglobin: 11.6 g/dL — ABNORMAL LOW (ref 13.0–17.0)
MCV: 87.2 fL (ref 78.0–100.0)
RBC: 3.84 MIL/uL — ABNORMAL LOW (ref 4.22–5.81)
RDW: 12.6 % (ref 11.5–15.5)
WBC: 8.1 10*3/uL (ref 4.0–10.5)

## 2011-06-10 MED ORDER — FERROUS SULFATE 325 (65 FE) MG PO TABS: 325.0000 mg | ORAL_TABLET | Freq: Three times a day (TID) | ORAL | Status: DC

## 2011-06-10 MED ORDER — ASPIRIN 325 MG PO TABS: 325.0000 mg | ORAL_TABLET | Freq: Two times a day (BID) | ORAL | Status: DC

## 2011-06-10 MED ORDER — METHOCARBAMOL 500 MG PO TABS: 500.0000 mg | ORAL_TABLET | Freq: Four times a day (QID) | ORAL | Status: AC | PRN

## 2011-06-10 MED ORDER — DIPHENHYDRAMINE HCL 25 MG PO CAPS: 25.0000 mg | ORAL_CAPSULE | Freq: Four times a day (QID) | ORAL | Status: DC | PRN

## 2011-06-10 MED ORDER — DSS 100 MG PO CAPS: 100.0000 mg | ORAL_CAPSULE | Freq: Two times a day (BID) | ORAL | Status: AC

## 2011-06-10 MED ORDER — HYDROCODONE-ACETAMINOPHEN 7.5-325 MG PO TABS: 1.0000 | ORAL_TABLET | ORAL | Status: AC | PRN

## 2011-06-10 MED ORDER — POLYETHYLENE GLYCOL 3350 17 G PO PACK: 17.0000 g | PACK | Freq: Two times a day (BID) | ORAL | Status: AC

## 2011-06-10 NOTE — Progress Notes (Signed)
Occupational Therapy Note Chart reviewed. Spoke with patient and he doesn't feel he needs OT. He has all DME delivered and has help PRN at discharge. Will sign off. Judithann Sauger OTR/L [phone] 06/10/2011

## 2011-06-10 NOTE — Discharge Instructions (Signed)
Total Hip Replacement Care After Please read the instructions outlined below. Refer to these instructions for the next few weeks. These discharge instructions provide you with general information on caring for yourself after surgery. Your caregiver may also give you more detailed instructions. If you have any problems or questions after discharge, please call your caregiver. HOME CARE INSTRUCTIONS   It will be normal to be sore for a couple weeks following surgery. See your caregiver if this seems to be getting worse rather than better.   Only take over-the-counter or prescription medicines for pain, discomfort, or fever as directed by your caregiver.   Use showers for bathing, until seen or as instructed.   Change dressings if necessary or as directed.   You may resume normal diet and activities as directed or allowed.   Avoid lifting until you are instructed otherwise.   Make an appointment to see your caregiver for stitch or staple removal when instructed.   You may use ice on your incision for 15 to 20 minutes 3 to 4 times per day for the first two days.   Use a raised toilet seat and avoid sitting in low chairs as instructed.   Use crutches or a walker as instructed.  SEEK MEDICAL CARE IF:  There is redness, swelling, or increasing pain or tenderness in the wound.   There is pus or unusual drainage coming from the wound.   There is a foul smell coming from the wound or dressing.   The wound breaks open (the edges do not stay together) after sutures or staples have been removed.   You have any other questions or concerns.  SEEK IMMEDIATE MEDICAL CARE IF:   You have a fever.   You develop swelling of your calf or leg or there is shortness of breath, difficulty breathing, or chest pain.   You develop a rash.   You develop any reaction or side effects to medications given.   You develop increasing pain in your hip.  MAKE SURE YOU:  Understand these instructions.    Will watch your condition.   Will get help right away if you are not doing well or get worse.  Document Released: 07/24/2004 Document Revised: 12/24/2010 Document Reviewed: 11/06/2008 [facility] Patient Information 2012 [place], [place].Hip Rehabilitation, Guidelines Following Surgery The results of a hip operation are greatly improved after range of motion and muscle strengthening exercises. Follow all safety measures which are given to protect your hip. If any of these exercises cause increased pain or swelling in your joint, decrease the amount until you are comfortable again. Then slowly increase the exercises. Call your caregiver if you have problems or questions. HOME CARE INSTRUCTIONS  Most of the following instructions are designed to prevent the dislocation of your new hip.  Do not put on socks or shoes without following the instructions of your caregivers.   Sit on high chairs so your hips are not bent more than 90 degrees.   Sit on chairs with arms. Use the chair arms to help push yourself up when arising.   Keep your leg on the side of the operation out in front of you when standing up.   Arrange for the use of a toilet seat elevator so you are not sitting low.   Do not do any exercises or get in any positions that cause your toes to point in (pigeon toed).   Always sleep with a pillow between your legs. Do not lie on your side in sleep  with both knees touching the bed.   You may resume a sexual relationship in one month or when given the OK by your caregiver.   Use crutches or walker as long as suggested by your caregivers.   Begin weight bearing with your caregiver's approval.   Avoid periods of inactivity such as sitting longer than an hour when not asleep. This helps prevent blood clots.   Return to work as instructed by your caregiver.   Do not drive a car for 6 weeks or as instructed.   Do not drive while taking narcotics.   Wear elastic stockings until  instructed not to.   Make sure you keep all of your appointments after your operation with all of your doctors and caregivers.  RANGE OF MOTION AND STRENGTHENING EXERCISES These exercises are designed to help you keep full movement of your hip joint. Follow your caregiver's or physical therapist's instructions. Perform all exercises about fifteen times, three times per day or as directed. Exercise both hips, even if you have had only one joint replacement. These exercises can be done on a training (exercise) mat, on the floor, on a table or on a bed. Use whatever works the best and is most comfortable for you. Use music or television while you are exercising so that the exercises are a pleasant break in your day. This will make your life better with the exercises acting as a break in routine you can look forward to.  Lying on your back, slowly slide your foot toward your buttocks, raising your knee up off the floor. Then slowly slide your foot back down until your leg is straight again.   Lying on your back spread your legs as far apart as you can without causing discomfort.   Lying on your side, raise your upper leg and foot straight up from the floor as far as is comfortable. Slowly lower the leg and repeat.   Lying on your back, tighten up the muscle in the front of your thigh (quadriceps muscles). You can do this by keeping your leg straight and trying to raise your heel off the floor. This helps strengthen the largest muscle supporting your knee.   Lying on your back, tighten up the muscles of your buttocks both with the legs straight and with the knee bent at a comfortable angle while keeping your heel on the floor.   Lying on your stomach, lift your toes off the floor towards your buttocks. Bend your knee as far as is comfortable. Tighten the muscles in the buttocks while doing this.  Document Released: 08/08/2003 Document Revised: 12/24/2010 Document Reviewed: 07/26/2007 [facility] Patient  Information 2012 [place], [place].Venous Thromboembolism, Prevention A venous thromboembolism is a blood clot that forms in a vein. A blood clot in a deep vein is called a deep venous thrombosis (DVT). A blood clot in the lungs is called a pulmonary embolism (PE). Blood clots are dangerous and can cause death. Blood clots can form in the:  Lungs.   Legs.   Arms.  CAUSES Blood clots can form in a vein from not moving (immobility) or poor blood circulation. When blood is not circulated well and blood "pools" in a vein, clotting factors in the blood can form a blood clot. Conditions that may increase your risk of a blood clot are:  Recent orthopedic or general surgery such as hip, knee or belly surgery.   Sit or lie still for over one hour during travel or are confined to a  bed or chair most of the time. (for example, during long distance travel, paralysis, or recovery from an illness or an operation).   Have had a stroke, heart attack, heart failure or are paralyzed.   Have a broken a bone such as a leg, hip or pelvis.   Have had cancer or are being treated for it.   Have a history of blood clots or have a family history of blood clots.   Take hormones, especially for birth control or hormone replacement therapy.   Are overweight (obese).   Are over 52 years of age or are male.   Smoke.   Have an implanted vascular access device.   Have blood circulation problems.  SYMPTOMS Symptoms of blood clot depend on where the clot is located:   Signs of a clot in a leg or arm vein may include:   Swelling of the leg or arm, especially on one side.   Warmth and redness of the leg or arm.   Pain in an arm or leg (worse when standing or walking).   Signs of a clot in the lungs can include:   Difficulty breathing or shortness of breath.   Chest pain. Pain is often worse with deep breaths.   Coughing.   Coughing up blood or blood tinged phlegm.   Rapid heartbeat.   Fainting.  A  blood clot in the lungs (PE) is a medical emergency. Get immediate help if you have problems breathing.  PREVENTION  Exercise regularly. Take a brisk 30 minute walk every day. Staying active and moving around can help prevent blood clots.   Avoid sitting or lying in bed for long periods of time. Change your position often, especially during a long trip.   Women, especially those over the age of 72, should consider the risks and benefits of taking estrogen medications. This includes birth control pills and hormone replacement therapy.   Do not smoke, especially if you take estrogen medications. If you smoke, talk to your caregiver on how to quit.   Eat plenty of fruits and vegetables. Ask your caregiver or dietician if there are foods you should avoid.   Maintain a weight as suggested by your caregiver.   Wear loose-fitting clothing. Avoid constrictive or tight clothing around your legs or waist.   Try not to bump or injure your legs. Avoid crossing your legs when you are sitting.   Do not use pillows under your knees unless told by your caregiver.   Take all medicines that your caregiver prescribes you.   Wear special stockings (compression stockings or TED hose) if your caregiver prescribes them.   Wearing compression stockings (support hose) can make the leg veins more narrow. This increases blood flow in the legs and can help prevent blood clots.   It is important to wear compression stockings correctly. Do not let them bunch up when you are wearing them.  TRAVEL Long distance travel can increase the risk of a blood clot. To prevent a blood clot when traveling:  You should exercise your legs by walking or by pumping your muscles every hour. To help prevent poor circulation on long trips, stand, stretch, and walk up and down the aisle of your airplane, train or bus as often as possible to get the blood moving.   Do squats if you are able. If you are unable to do squats, raise your  foot on the balls of your feet and tighten your lower leg muscles (particularly the calve  muscles) while seated. Pointing (flexing and extending) your toes while tightening your calves while seated are also good exercises to do every hour during long trips. They help increase blood flow and reduce risk of DVT.   Stay well hydrated. Drink water regularly when traveling, especially when you are sitting or immobile for long periods of time.   Use of drugs to prevent DVT during routine travel is not generally recommended. Before taking any drugs to reduce risk of DVT, consult your caregiver.  SURGERY AND HOSPITALIZATION  People who are at high risk for a blood clot may be given a blood thinning medication when they are hospitalized even if they are not going to have surgery.   A long trip prior to surgery can increase the risk of a clot for patients undergoing hip and knee replacements. Talk to your caregiver about travel plans before your surgery.   After hip or knee surgery, your caregiver may give you blood thinners to help prevent blood clots.   Blood thinning medications (anticoagulant's) may be given to people at high risk of developing thromboembolism, before, during or sometimes after surgery, including people with clotting disorders or with a history of past thromboembolism.  TRAVEL AFTER SURGERY  In orthopedic surgery, the cutting of bones prompts the body to increase clotting factors in the blood. Due to the size of the bones involved in hip and knee replacements, there is a higher risk of blood clotting than other orthopedic surgeries.   There is a risk of clotting for up to 4-6 weeks after surgery. Flying or traveling long distances can increase your risk of a clot. As a result, those who travel long distances may need additional preventive measures after their procedure.   Drink only non-alcoholic beverages during your flight, train or car travel. Alcohol can dehydrate you and increase  your risk of getting blood clots.  SEEK IMMEDIATE MEDICAL CARE IF:   You develop chest pain.   You develop severe shortness of breath.   You have breathing problems after traveling.   You develop swelling or pain in the leg.   You begin to cough up bloody mucus or phlegm (sputum).   You feel dizzy or faint.  Document Released: 12/23/2008 Document Revised: 12/24/2010 Document Reviewed: 12/23/2008 [facility] Patient Information 2012 [place], [place].

## 2011-06-10 NOTE — Progress Notes (Signed)
Physical Therapy Treatment Patient Details Name: Clayton [name] MRN: [id number] DOB: 09/03/1945 Today's Date: 06/10/2011 Time: 4098-1191 PT Time Calculation (min): 25 min  PT Assessment / Plan / Recommendation Comments on Treatment Session  pt has progressed to level to DC to home.    Follow Up Recommendations  Home health PT;Supervision/Assistance - 24 hour    Barriers to Discharge        Equipment Recommendations  Rolling walker with 5" wheels    Recommendations for Other Services    Frequency     Plan      Precautions / Restrictions     Pertinent Vitals/Pain No pain    Mobility  Bed Mobility Supine to Sit: 7: Independent Sit to Supine: 7: Independent Transfers Transfers: Sit to Stand;Stand to Sit Sit to Stand: From bed;5: Supervision Stand to Sit: To chair/3-in-1;5: Supervision;6: Modified independent (Device/Increase time) Details for Transfer Assistance: Pt is up and down ad lib Ambulation/Gait Ambulation/Gait Assistance: 5: Supervision Ambulation Distance (Feet): 300 Feet Assistive device: Rolling walker Ambulation/Gait Assistance Details: L leg is slightly IR during swing and stance, at times L foot catches floor. discussed w/ pt to be cautious of this when on unlevels and carpet. Gait Pattern: Step-through pattern;Decreased hip/knee flexion - left Stairs: Yes Stairs Assistance: 5: Supervision Stairs Assistance Details (indicate cue type and reason): vc  for safety Stair Management Technique: One rail Right;Forwards Number of Stairs: 2     Exercises     PT Diagnosis:    PT Problem List:   PT Treatment Interventions:     PT Goals Acute Rehab PT Goals Pt will go Supine/Side to Sit: with modified independence PT Goal: Supine/Side to Sit - Progress: Met Pt will go Sit to Supine/Side: with modified independence PT Goal: Sit to Supine/Side - Progress: Met Pt will go Sit to Stand: with supervision PT Goal: Sit to Stand - Progress: Progressing toward  goal Pt will go Stand to Sit: with supervision PT Goal: Stand to Sit - Progress: Progressing toward goal Pt will Ambulate: >150 feet;with supervision;with rolling walker PT Goal: Ambulate - Progress: Met Pt will Go Up / Down Stairs: 3-5 stairs (pt has only 2 steps) PT Goal: Up/Down Stairs - Progress: Met Pt will Perform Home Exercise Program: with supervision, verbal cues required/provided PT Goal: Perform Home Exercise Program - Progress: Met  Visit Information  Last PT Received On: 06/10/11 Assistance Needed: +1    Subjective Data  Subjective: i am ready to go home. i have been walking up steps like this for years   Cognition       Balance     End of Session PT - End of Session Activity Tolerance: Patient tolerated treatment well Patient left: in chair;with family/visitor present Nurse Communication:  (pt is ready to DC)    Rada Hay 06/10/2011, 1:23 PM

## 2011-06-10 NOTE — Progress Notes (Signed)
Subjective: 2 Days Post-Op Procedure(s) (LRB): TOTAL HIP ARTHROPLASTY ANTERIOR APPROACH (Left)   Patient reports pain as mild, most of the time there is no pain. Minimal pain is well controlled with pain medications. No events throughout the night. Ready to be discharged home.  Objective:   VITALS:   Filed Vitals:   06/10/11 0615  BP: 152/92  Pulse: 66  Temp: 99.1 F (37.3 C)  Resp: 16    Neurovascular intact Dorsiflexion/Plantar flexion intact Incision: dressing C/D/I No cellulitis present Compartment soft  LABS  Basename 06/10/11 0417 06/09/11 0502  HGB 11.6* 12.6*  HCT 33.5* 35.8*  WBC 8.1 9.4  PLT 160 179     Basename 06/10/11 0417 06/09/11 0502  NA 135 134*  K 3.7 3.7  BUN 20 18  CREATININE 1.32 1.29  GLUCOSE 131* 131*     Assessment/Plan: 2 Days Post-Op Procedure(s) (LRB): TOTAL HIP ARTHROPLASTY ANTERIOR APPROACH (Left)   Up with therapy Discharge home with home health Follow up in 2 weeks at [facility].  Follow-up Information    Follow up with OLIN,Dayn Barich D in 2 weeks.   Contact information:   [facility] [address] [place] [address] [phone]          Anastasio Auerbach. Courtland Reas   PAC  06/10/2011, 7:08 AM

## 2011-06-10 NOTE — Progress Notes (Signed)
  CARE MANAGEMENT NOTE 06/10/2011  Patient:  Clayton [name], Clayton [name]   Account Number:  [id number]  Date Initiated:  06/10/2011  Documentation initiated by:  Colleen Can  Subjective/Objective Assessment:   dx osteoarthritis hip-left; total hip replacemnt-anterior approach     Action/Plan:   home with hh services   Anticipated DC Date:  06/10/2011   Anticipated DC Plan:  HOME W HOME HEALTH SERVICES  In-house referral  NA      DC Planning Services  CM consult      [facility]  HOME [facility]      DME arranged  NA      DME agency  NA     HH arranged  HH-2 PT      HH agency  [facility]   Status of service:  Completed, signed off Medicare Important Message given?  NO (If response is "NO", the following Medicare IM given date fields will be blank)  Discharge Disposition:  HOME W HOME HEALTH SERVICES

## 2011-06-10 NOTE — Progress Notes (Signed)
Discharge summary sent to payer through [facility]  

## 2011-06-10 NOTE — Progress Notes (Signed)
Patient OOB in reclining chair, wife at bedside. Discharge instructions and teaching given with teach back. He is to discharge home with home health, no acute distress noted.

## 2011-06-11 NOTE — Discharge Summary (Signed)
Physician Discharge Summary  Patient ID: Clayton [name] MRN: [id number] DOB/AGE: 09/17/1945 66 y.o.  Admit date: 06/08/2011 Discharge date: 06/10/2011  Procedures:  Procedure(s) (LRB): TOTAL HIP ARTHROPLASTY ANTERIOR APPROACH (Left)  Attending Physician:  Dr. Durene Romans   Admission Diagnoses: Left hip OA and pain   Discharge Diagnoses:  Principal Problem:  *S/P left THA, AA HTN  Sleep Apnea  Hyperlipidemia  Impaired hearing  Arthritis  HPI: Pt is a 66 y.o. male complaining of left hip pain for 4+ years. Pain had continually increased since the beginning, especially for the last 1.5 years. X-rays in the clinic show end-stage arthritic changes of the left hip. Pt has tried various conservative treatments which have failed to alleviate their symptoms, including a steroid injection which helps for only a couple of months. Various options are discussed with the patient. Risks, benefits and expectations were discussed with the patient. Patient understand the risks, benefits and expectations and wishes to proceed with surgery.   PCP: No primary provider on file.   Discharged Condition: good  Hospital Course:  Patient underwent the above stated procedure on 06/08/2011. Patient tolerated the procedure well and brought to the recovery room in good condition and subsequently to the floor.  POD #1 BP: 129/76 ; Pulse: 72 ; Temp: 98.8 F (37.1 C) ; Resp: 16  Pt's foley was removed, as well as the hemovac drain removed. IV was changed to a saline lock. Patient reports pain as mild, pain well controlled with medications. No events throughout the night. Neurovascular intact, dorsiflexion/plantar flexion intact, incision: dressing C/[name]/I, no cellulitis present and compartment soft.   LABS  Basename  06/09/11 0502   HGB  12.6  HCT  35.8   POD #2  BP: 152/92 ; Pulse: 66 ; Temp: 99.1 F (37.3 C) ; Resp: 16  Patient reports pain as mild, most of the time there is no pain. Minimal pain is  well controlled with pain medications. No events throughout the night. Ready to be discharged home. Neurovascular intact, dorsiflexion/plantar flexion intact, incision: dressing C/[name]/I, no cellulitis present and compartment soft.   LABS  Basename  06/10/11 0417   HGB  11.6  HCT  33.5    Discharge Exam: General appearance: alert, cooperative and no distress Extremities: Homans sign is negative, no sign of DVT, no edema, redness or tenderness in the calves or thighs and no ulcers, gangrene or trophic changes  Disposition: Home-Health Care  with follow up in 2 weeks  Follow-up Information    Follow up with OLIN,Clayton [name] in 2 weeks.   Contact information:   [facility] [address] [place] [address] [phone]          Discharge Orders    Future Orders Please Complete By Expires   Diet - low sodium heart healthy      Call MD / Call 911      Comments:   If you experience chest pain or shortness of breath, CALL 911 and be transported to the hospital emergency room.  If you develope a fever above 101 F, pus (white drainage) or increased drainage or redness at the wound, or calf pain, call your surgeon's office.   Discharge instructions      Comments:   Maintain surgical dressing for 8 days, then replace with gauze and tape. Keep the area dry and clean until follow up. Follow up in 2 weeks at [facility]. Call with any questions or concerns.  Constipation Prevention      Comments:   Drink plenty of fluids.  Prune juice may be helpful.  You may use a stool softener, such as Colace (over the counter) 100 mg twice a day.  Use MiraLax (over the counter) for constipation as needed.   Increase activity slowly as tolerated      Driving restrictions      Comments:   No driving for 4 weeks   Change dressing      Comments:   Maintain surgical dressing for 8 days, then replace with 4x4 guaze and tape. Keep the area dry and  clean.   TED hose      Comments:   Use stockings (TED hose) for 2 weeks on both leg(s).  You may remove them at night for sleeping.      Discharge Medication List as of 06/10/2011 10:28 AM    START taking these medications   Details  diphenhydrAMINE (BENADRYL) 25 mg capsule Take 1 capsule (25 mg total) by mouth every 6 (six) hours as needed for itching, allergies or sleep., Starting 06/10/2011, Until Sun 06/20/11, No Print    docusate sodium 100 MG CAPS Take 100 mg by mouth 2 (two) times daily., Starting 06/10/2011, Until Sun 06/20/11, No Print    ferrous sulfate 325 (65 FE) MG tablet Take 1 tablet (325 mg total) by mouth 3 (three) times daily after meals., Starting 06/10/2011, Until Fri 06/09/12, No Print    HYDROcodone-acetaminophen (NORCO) 7.5-325 MG per tablet Take 1-2 tablets by mouth every 4 (four) hours as needed for pain., Starting 06/10/2011, Until Sun 06/20/11, Print    methocarbamol (ROBAXIN) 500 MG tablet Take 1 tablet (500 mg total) by mouth every 6 (six) hours as needed (muscle spasms)., Starting 06/10/2011, Until Sun 06/20/11, No Print    polyethylene glycol (MIRALAX / GLYCOLAX) packet Take 17 g by mouth 2 (two) times daily., Starting 06/10/2011, Until Sun 06/13/11, No Print      CONTINUE these medications which have CHANGED   Details  aspirin 325 MG tablet Take 1 tablet (325 mg total) by mouth 2 (two) times daily., Starting 06/10/2011, Until Discontinued, No Print      CONTINUE these medications which have NOT CHANGED   Details  amLODipine-benazepril (LOTREL) 10-40 MG per capsule Take 1 capsule by mouth daily., Until Discontinued, Historical Med    Boswellia-Glucosamine-Vit [name] (GLUCOSAMINE COMPLEX PO) Take 1,500 tablets by mouth daily. , Until Discontinued, Historical Med    hydrochlorothiazide (HYDRODIURIL) 25 MG tablet Take 25 mg by mouth daily., Until Discontinued, Historical Med    metoprolol succinate (TOPROL-XL) 100 MG 24 hr tablet Take 100 mg by mouth daily. Take with or  immediately following a meal., Until Discontinued, Historical Med    Multiple Vitamin (MULITIVITAMIN WITH MINERALS) TABS Take 1 tablet by mouth daily., Until Discontinued, Historical Med    Omega 3-6-9 Fatty Acids (OMEGA-3-6-9 PO) Take 1 capsule by mouth daily. Omega 3= 800mg ,omega 6=380mg ,omega 9 =300mg , Until Discontinued, Historical Med    vitamin C (ASCORBIC ACID) 500 MG tablet Take 500 mg by mouth daily., Until Discontinued, Historical Med    zinc gluconate 50 MG tablet Take 50 mg by mouth daily., Until Discontinued, Historical Med    Zn-Pyg Afri-Nettle-Saw Palmet (SAW PALMETTO COMPLEX PO) Take 1 capsule by mouth daily., Until Discontinued, Historical Med      STOP taking these medications     meloxicam (MOBIC) 15 MG tablet Comments:  Reason for Stopping:  Signed: Anastasio Auerbach. Alessia Gonsalez   PAC  06/11/2011, 8:34 AM

## 2012-01-19 HISTORY — PX: CATARACT EXTRACTION: SUR2

## 2012-06-29 ENCOUNTER — Telehealth: Payer: Self-pay | Admitting: Cardiovascular Disease

## 2012-06-29 NOTE — Telephone Encounter (Signed)
Been waiting for his medicine since Monday-Need his HCTZ and his Beta Blocker-Would you please call it in today to [facility] [address] in [place]!

## 2012-06-30 ENCOUNTER — Other Ambulatory Visit: Payer: Self-pay | Admitting: *Deleted

## 2012-06-30 MED ORDER — HYDROCHLOROTHIAZIDE 25 MG PO TABS: 25.0000 mg | ORAL_TABLET | Freq: Every day | ORAL | Status: DC

## 2012-06-30 MED ORDER — METOPROLOL SUCCINATE ER 100 MG PO TB24: 100.0000 mg | ORAL_TABLET | Freq: Every day | ORAL | Status: DC

## 2012-07-04 ENCOUNTER — Other Ambulatory Visit: Payer: Self-pay | Admitting: *Deleted

## 2012-08-17 ENCOUNTER — Ambulatory Visit (INDEPENDENT_AMBULATORY_CARE_PROVIDER_SITE_OTHER): Payer: Medicare Other | Admitting: Cardiovascular Disease

## 2012-08-17 VITALS — BP 140/98 | HR 76 | Ht 75.0 in | Wt 252.7 lb

## 2012-08-17 DIAGNOSIS — E785 Hyperlipidemia, unspecified: Secondary | ICD-10-CM

## 2012-08-17 DIAGNOSIS — Z9989 Dependence on other enabling machines and devices: Secondary | ICD-10-CM

## 2012-08-17 DIAGNOSIS — I10 Essential (primary) hypertension: Secondary | ICD-10-CM

## 2012-08-17 DIAGNOSIS — E669 Obesity, unspecified: Secondary | ICD-10-CM

## 2012-08-17 DIAGNOSIS — G4733 Obstructive sleep apnea (adult) (pediatric): Secondary | ICD-10-CM

## 2012-08-17 DIAGNOSIS — E66811 Obesity, class 1: Secondary | ICD-10-CM

## 2012-08-17 MED ORDER — PITAVASTATIN CALCIUM 2 MG PO TABS: 2.0000 mg | ORAL_TABLET | Freq: Every day | ORAL | Status: DC

## 2012-08-17 MED ORDER — PITAVASTATIN CALCIUM 4 MG PO TABS: ORAL_TABLET | ORAL | Status: DC

## 2012-08-17 MED ORDER — AMLODIPINE BESY-BENAZEPRIL HCL 10-40 MG PO CAPS: 1.0000 | ORAL_CAPSULE | Freq: Every day | ORAL | Status: DC

## 2012-08-17 NOTE — Patient Instructions (Signed)
Your physician has recommended you make the following change in your medication:Restart lotrel 10/40 The prescription has already been sent to your pharmacy.  Start samples given for Livalo 4 mg. Take 1/2 tablet daily.  Your physician recommends that you schedule a follow-up appointment in: 3 MONTHS.

## 2012-08-20 ENCOUNTER — Encounter: Payer: Self-pay | Admitting: Cardiovascular Disease

## 2012-08-20 DIAGNOSIS — Z9989 Dependence on other enabling machines and devices: Secondary | ICD-10-CM | POA: Insufficient documentation

## 2012-08-20 DIAGNOSIS — G4733 Obstructive sleep apnea (adult) (pediatric): Secondary | ICD-10-CM | POA: Insufficient documentation

## 2012-08-20 DIAGNOSIS — I10 Essential (primary) hypertension: Secondary | ICD-10-CM | POA: Insufficient documentation

## 2012-08-20 DIAGNOSIS — E785 Hyperlipidemia, unspecified: Secondary | ICD-10-CM | POA: Insufficient documentation

## 2012-08-20 HISTORY — DX: Obstructive sleep apnea (adult) (pediatric): G47.33

## 2012-08-20 NOTE — Progress Notes (Addendum)
Patient ID: Clayton [name], male   DOB: [birth year], 67 y.o.   MRN: [id number]     HPI: Clayton [name], is a 67 y.o. male is 68 presents for cardiology followup evaluation. Clayton [name] has a history of hypertension, obstructive sleep apnea for which he uses CPAP 100% of the time, hypertension, paroxysmal atrial fibrillation, and hyperlipidemia. He has had weight fluctuations over the years. He did undergo left hip replacement surgery by Clayton [name] and now has been able to continue to be active and exercises routinely. Over the past 5 months, he has purposely lost 25 pounds. Remotely, he had been on Lotrel 10/40 recently had been taking amlodipine 5 mg with that with benazapril 40 mg daily. He states recently he was bitten by very aggressive bees and since that time his blood pressure has again become elevated.  He is unaware of any rhythm disturbance; he denies chest pain.  He presents for evaluation.     Past Medical History  Diagnosis Date  . Hypertension   . Sleep apnea     CPAP settings ?   Marland Kitchen Arthritis     hips   . Meniere disease   . Dysrhythmia     PAF- hx of 7 years ago     Past Surgical History  Procedure Laterality Date  . Knee arthroscopy      left knee- 2002   . Total hip arthroplasty  06/08/2011    Procedure: TOTAL HIP ARTHROPLASTY ANTERIOR APPROACH;  Surgeon: Clayton Pal, MD;  Location: [facility];  Service: Orthopedics;  Laterality: Left;    No Known Allergies  Current Outpatient Prescriptions  Medication Sig Dispense Refill  . amLODipine (NORVASC) 5 MG tablet Take 1 tablet by mouth daily.      Marland Kitchen aspirin 325 MG tablet Take 1 tablet (325 mg total) by mouth 2 (two) times daily.      . benazepril (LOTENSIN) 40 MG tablet Take 1 tablet by mouth daily.      . Boswellia-Glucosamine-Vit D (GLUCOSAMINE COMPLEX PO) Take 1,500 tablets by mouth daily.       . hydrochlorothiazide (HYDRODIURIL) 25 MG tablet Take 1 tablet (25 mg total) by mouth daily.  30 tablet  6  . metoprolol  succinate (TOPROL-XL) 100 MG 24 hr tablet Take 1 tablet (100 mg total) by mouth daily. Take with or immediately following a meal.  30 tablet  6  . Multiple Vitamin (MULITIVITAMIN WITH MINERALS) TABS Take 1 tablet by mouth daily.      [name] 3-6-9 Fatty Acids (OMEGA-3-6-9 PO) Take 1 capsule by mouth daily. Omega 3= 800mg ,omega 6=380mg ,omega 9 =300mg       . vitamin C (ASCORBIC ACID) 500 MG tablet Take 500 mg by mouth daily.      Marland Kitchen zinc gluconate 50 MG tablet Take 50 mg by mouth daily.      Marland Kitchen Zn-Pyg Afri-Nettle-Saw Palmet (SAW PALMETTO COMPLEX PO) Take 1 capsule by mouth daily.      Marland Kitchen amLODipine-benazepril (LOTREL) 10-40 MG per capsule Take 1 capsule by mouth daily.  30 capsule  6  . Pitavastatin Calcium (LIVALO) 2 MG TABS Take 1 tablet (2 mg total) by mouth daily.  30 tablet  6  . Pitavastatin Calcium (LIVALO) 4 MG TABS Take 1/2 tablet daily.  28 tablet  0   No current facility-administered medications for this visit.    Socially he is remarried for 7 years per his first wife passed away. 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 there is no tobacco history.  ROS is negative for fevers, chills or night sweats.  He denies recurrent palpitations. He denies chest pain. There is no shortness of breath. He denies residual daytime sleepiness. He uses CPAP 100% of the time. He does take a nap in the afternoon he takes a nap using a CPAP device. He denies restless legs. He denies significant edema. He denies bleeding. Other system review is negative.  PE BP 140/98  Pulse 76  Ht 6\' 3"  (1.905 m)  Wt 252 lb 11.2 oz (114.624 kg)  BMI 31.59 kg/m2  Repeat blood pressure by me was 145/95 General: Alert, oriented, no distress.  Skin: normal turgor, no rashes HEENT: Normocephalic, atraumatic. Pupils round and reactive; sclera anicteric;no lid lag.  Nose without nasal septal hypertrophy Mouth/Parynx benign; Mallinpatti scale 3 Neck: No JVD, no carotid  briuts Lungs: clear to ausculatation and percussion; no wheezing or rales Heart: RRR, s1 s2 normal over 6 systolic murmur Abdomen: Mild central adiposity;soft, nontender; no hepatosplenomehaly, BS+; abdominal aorta nontender and not dilated by palpation. Pulses 2+ Extremities: no clubbing cyanosis or edema, Homan's sign negative  Neurologic: grossly nonfocal  ECG: Normal sinus rhythm at 76 beats per minute. LVH with QRS widening.  LABS:  BMET    Component Value Date/Time   NA 135 06/10/2011 0417   K 3.7 06/10/2011 0417   CL 99 06/10/2011 0417   CO2 27 06/10/2011 0417   GLUCOSE 131* 06/10/2011 0417   BUN 20 06/10/2011 0417   CREATININE 1.32 06/10/2011 0417   CALCIUM 8.6 06/10/2011 0417   GFRNONAA 55* 06/10/2011 0417   GFRAA 64* 06/10/2011 0417     Hepatic Function Panel  No results found for this basename: prot, albumin, ast, alt, alkphos, bilitot, bilidir, ibili     CBC    Component Value Date/Time   WBC 8.1 06/10/2011 0417   RBC 3.84* 06/10/2011 0417   HGB 11.6* 06/10/2011 0417   HCT 33.5* 06/10/2011 0417   PLT 160 06/10/2011 0417   MCV 87.2 06/10/2011 0417   MCH 30.2 06/10/2011 0417   MCHC 34.6 06/10/2011 0417   RDW 12.6 06/10/2011 0417   LYMPHSABS 1.2 06/01/2011 0855   MONOABS 0.5 06/01/2011 0855   EOSABS 0.1 06/01/2011 0855   BASOSABS 0.0 06/01/2011 0855     BNP No results found for this basename: probnp    Lipid Panel  No results found for this basename: chol, trig, hdl, cholhdl, vldl, ldlcalc     RADIOLOGY: No results found.    ASSESSMENT AND PLAN: Mr [name]'s blood pressure today is elevated. He has been on a reduced dose of amlodipine. I am recommending further titration back to 10 mg of amlodipine. He continues to have issues with hyperlipidemia. An  NMR lipoprofile did show significant increased LDL particle #1877 with small LDL particles and 912. Insulin resistance score 47. In the past, he believes several statins may have contributed to his Mnire's  symptoms, he also was intolerant to that he appear giving him samples of little low at 2 mg as a trial to see if he can tolerate this. Laboratory obtained in 3 months. I will see him in followup for further evaluation. We did also discuss continued weight loss and exercise.     Lennette Bihari, MD, [facility]  08/20/2012 8:44 AM   Who is reason

## 2012-09-11 ENCOUNTER — Encounter: Payer: Self-pay | Admitting: *Deleted

## 2012-09-15 ENCOUNTER — Ambulatory Visit: Payer: Medicare Other | Admitting: Cardiovascular Disease

## 2012-12-26 ENCOUNTER — Encounter (HOSPITAL_COMMUNITY): Payer: Self-pay | Admitting: Emergency Medicine

## 2012-12-26 ENCOUNTER — Emergency Department (HOSPITAL_COMMUNITY)
Admission: EM | Admit: 2012-12-26 | Discharge: 2012-12-27 | Disposition: A | Discharge status: Home / Self Care | Payer: Medicare Other | Attending: Emergency Medicine | Admitting: Emergency Medicine

## 2012-12-26 DIAGNOSIS — I499 Cardiac arrhythmia, unspecified: Secondary | ICD-10-CM | POA: Insufficient documentation

## 2012-12-26 DIAGNOSIS — Z79899 Other long term (current) drug therapy: Secondary | ICD-10-CM | POA: Insufficient documentation

## 2012-12-26 DIAGNOSIS — Z95818 Presence of other cardiac implants and grafts: Secondary | ICD-10-CM | POA: Insufficient documentation

## 2012-12-26 DIAGNOSIS — Z7982 Long term (current) use of aspirin: Secondary | ICD-10-CM | POA: Insufficient documentation

## 2012-12-26 DIAGNOSIS — M130 Polyarthritis, unspecified: Secondary | ICD-10-CM | POA: Insufficient documentation

## 2012-12-26 DIAGNOSIS — R350 Frequency of micturition: Secondary | ICD-10-CM | POA: Insufficient documentation

## 2012-12-26 DIAGNOSIS — Z8669 Personal history of other diseases of the nervous system and sense organs: Secondary | ICD-10-CM | POA: Insufficient documentation

## 2012-12-26 DIAGNOSIS — R300 Dysuria: Secondary | ICD-10-CM | POA: Insufficient documentation

## 2012-12-26 DIAGNOSIS — R110 Nausea: Secondary | ICD-10-CM | POA: Insufficient documentation

## 2012-12-26 DIAGNOSIS — M545 Low back pain, unspecified: Secondary | ICD-10-CM | POA: Insufficient documentation

## 2012-12-26 DIAGNOSIS — I10 Essential (primary) hypertension: Secondary | ICD-10-CM | POA: Insufficient documentation

## 2012-12-26 DIAGNOSIS — R109 Unspecified abdominal pain: Secondary | ICD-10-CM | POA: Insufficient documentation

## 2012-12-26 DIAGNOSIS — Z791 Long term (current) use of non-steroidal anti-inflammatories (NSAID): Secondary | ICD-10-CM | POA: Insufficient documentation

## 2012-12-26 DIAGNOSIS — Z96649 Presence of unspecified artificial hip joint: Secondary | ICD-10-CM | POA: Insufficient documentation

## 2012-12-26 LAB — URINALYSIS, ROUTINE W REFLEX MICROSCOPIC
Bilirubin Urine: NEGATIVE
Ketones, ur: 15 mg/dL — AB
Leukocytes, UA: NEGATIVE
Nitrite: NEGATIVE
Specific Gravity, Urine: 1.015 (ref 1.005–1.030)
Urobilinogen, UA: 0.2 mg/dL (ref 0.0–1.0)
pH: 7 (ref 5.0–8.0)

## 2012-12-26 MED ORDER — KETOROLAC TROMETHAMINE 15 MG/ML IJ SOLN
30.0000 mg | Freq: Once | INTRAMUSCULAR | Status: AC
  Administered 2012-12-26: 30 mg via INTRAVENOUS
  Filled 2012-12-26: qty 2

## 2012-12-26 MED ORDER — ONDANSETRON HCL 4 MG/2ML IJ SOLN
4.0000 mg | Freq: Once | INTRAMUSCULAR | Status: AC
  Administered 2012-12-26: 4 mg via INTRAVENOUS
  Filled 2012-12-26: qty 2

## 2012-12-26 MED ORDER — SODIUM CHLORIDE 0.9 % IV BOLUS (SEPSIS)
500.0000 mL | Freq: Once | INTRAVENOUS | Status: AC
  Administered 2012-12-26: 500 mL via INTRAVENOUS

## 2012-12-26 NOTE — ED Provider Notes (Signed)
67-year-old male who presents with new onset left lower back pain which he describes as sharp and stabbing, was acute in onset multiple hours ago today, associated with nausea, occasionally diaphoretic and has noted increased urinary frequency over the last couple of weeks. He denies any other pathologic red flags for back pain, has no abdominal pain and has no difficulty ambulating. Nothing seems to make this better or worse, it is relatively unrelieved throughout the day.  On my exam he has a normal neurologic exam to his lower extremities with strength, sensation and coordination. He has no reproducible tenderness to palpation with rotation, flexion or CVA tenderness. His abdomen is completely benign, soft and nontender with no palpable pulsating masses. A bedside ultrasound reveals no abdominal aortic aneurysm. At this point I would continue to keep kidney stone in the differential diagnosis, it is not appear to be musculoskeletal or neurologic and again he is no pathologic red light. Pursue CT scan noncontrast, urinalysis and renal function. Patient agreeable to plan  EMERGENCY DEPARTMENT ULTRASOUND  Study: Limited Retroperitoneal Ultrasound of the Abdominal Aorta.  INDICATIONS:Back pain  Multiple views of the abdominal aorta were obtained in real-time from the diaphragmatic hiatus to the aortic bifurcation in transverse planes with a multi-frequency probe.  PERFORMED BY: Myself  IMAGES ARCHIVED?: Yes  FINDINGS: Maximum aortic dimensions are 3.0  LIMITATIONS: Body habitus and Bowel gas  INTERPRETATION: No abdominal aortic aneurysm and Abdominal free fluid absent  Medical screening examination/treatment/procedure(s) were conducted as a shared visit with non-physician practitioner(s) and myself. I personally evaluated the patient during the encounter.   Braelen Sproule D Efton Thomley, MD 12/27/12 [id number] 

## 2012-12-26 NOTE — ED Notes (Signed)
Pt c/o of sharp pain that started abruptly in his Left lower back, He states that it moves slightly to the left side (flank) pain, Pt states that nothing helps to relieve the pain

## 2012-12-26 NOTE — ED Provider Notes (Signed)
CSN: [id number]     Arrival date & time 12/26/12  2024 History   First MD Initiated Contact with Patient 12/26/12 2256     Chief Complaint  Patient presents with  . Back Pain   (Consider location/radiation/quality/duration/timing/severity/associated sxs/prior Treatment) Patient is a 67 y.o. male presenting with back pain.  Back Pain Associated symptoms: no abdominal pain, no chest pain, no dysuria, no fever and no headaches    67 yo male with 8 hr hx of sudden onset Left lower back pain.  Patient states pain started around 3 pm today while he was sitting at his desk chair. Pain described as sharp and burning. Denies any exacerbating movements. Pain does not radiate and is rated at 8/10. Patent denies incontinence, weakness, or saddle paresthesia. Admits to family hx of kidney stones. PMH significant for cardiac Cath in 2007, HTN/Hyperlipidemia, and Left Total hip replacement in 2013.  Past Medical History  Diagnosis Date  . Sleep apnea 09/14/04    [facility]- Dr. Welton Flakes; CPAP titration 11/12/04- Dr. Welton Flakes  . Arthritis     hips   . Meniere disease   . Dysrhythmia     PAF- hx of 7 years ago   . Hypertension 05/29/10    ECHO-EF 67% wnl   Past Surgical History  Procedure Laterality Date  . Knee arthroscopy      left knee- 2002   . Total hip arthroplasty  06/08/2011    Procedure: TOTAL HIP ARTHROPLASTY ANTERIOR APPROACH;  Surgeon: Shelda Pal, MD;  Location: [facility];  Service: Orthopedics;  Laterality: Left;  . Cardiac catheterization  11/03/05   Family History  Problem Relation Age of Onset  . Hypertension Mother   . Stroke Mother   . Diabetes Sister   . Stroke Maternal Grandmother   . Heart disease Maternal Grandfather   . Hypertension Maternal Grandfather   . Diabetes Maternal Grandfather   . Heart disease Paternal Grandfather    History  Substance Use Topics  . Smoking status: Never Smoker   . Smokeless tobacco: Never Used  . Alcohol Use: No    Review  of Systems  Constitutional: Negative for fever and chills.  Eyes: Negative for visual disturbance.  Respiratory: Negative for shortness of breath.   Cardiovascular: Negative for chest pain.  Gastrointestinal: Positive for nausea. Negative for vomiting and abdominal pain.  Genitourinary: Positive for frequency and difficulty urinating. Negative for dysuria, hematuria and testicular pain.  Musculoskeletal: Positive for back pain. Negative for neck pain and neck stiffness.  Skin: Negative for rash.  Allergic/Immunologic: Negative for immunocompromised state.  Neurological: Negative for dizziness and headaches.    Allergies  Statins  Home Medications   Current Outpatient Rx  Name  Route  Sig  Dispense  Refill  . amLODipine-benazepril (LOTREL) 10-40 MG per capsule   Oral   Take 1 capsule by mouth every evening.          Marland Kitchen aspirin 325 MG tablet   Oral   Take 1 tablet (325 mg total) by mouth 2 (two) times daily.           Take for 4 weeks   . [place]-Glucosamine-Vit D (GLUCOSAMINE COMPLEX PO)   Oral   Take 1,500 tablets by mouth every evening.          . hydrochlorothiazide (HYDRODIURIL) 25 MG tablet   Oral   Take 25 mg by mouth every evening.         . metoprolol succinate (TOPROL-XL) 100  MG 24 hr tablet   Oral   Take 100 mg by mouth every evening. Take with or immediately following a meal.         . Multiple Vitamin (MULITIVITAMIN WITH MINERALS) TABS   Oral   Take 1 tablet by mouth every evening.          [name] 3-6-9 Fatty Acids (OMEGA-3-6-9 PO)   Oral   Take 1 capsule by mouth every evening. Omega 3= 800mg ,omega 6=380mg ,omega 9 =300mg          . vitamin C (ASCORBIC ACID) 500 MG tablet   Oral   Take 500 mg by mouth every evening.          . zinc gluconate 50 MG tablet   Oral   Take 50 mg by mouth every evening.          Marland Kitchen Zn-Pyg Afri-Nettle-Saw Palmet (SAW PALMETTO COMPLEX PO)   Oral   Take 1 capsule by mouth every evening.          .  naproxen (NAPROSYN) 500 MG tablet   Oral   Take 1 tablet (500 mg total) by mouth 2 (two) times daily with a meal.   30 tablet   0   . ondansetron (ZOFRAN ODT) 4 MG disintegrating tablet   Oral   Take 1 tablet (4 mg total) by mouth every 8 (eight) hours as needed for nausea.   10 tablet   0   . oxyCODONE-acetaminophen (PERCOCET) 5-325 MG per tablet   Oral   Take 1 tablet by mouth every 4 (four) hours as needed.   20 tablet   0   . tamsulosin (FLOMAX) 0.4 MG CAPS capsule   Oral   Take 1 capsule (0.4 mg total) by mouth 2 (two) times daily.   10 capsule   0    BP 154/85  Pulse 95  Temp(Src) 98.2 F (36.8 C) (Oral)  Resp 16  SpO2 93% Physical Exam  Nursing note and vitals reviewed. Constitutional: He is oriented to person, place, and time. He appears well-developed and well-nourished. No distress.  HENT:  Head: Normocephalic and atraumatic.  Eyes: Conjunctivae and EOM are normal.  Neck: Normal range of motion. Neck supple.  Cardiovascular: Normal rate and regular rhythm.  Exam reveals no gallop and no friction rub.   No murmur heard. Pulmonary/Chest: Effort normal and breath sounds normal. No respiratory distress. He has no wheezes. He has no rales.  Abdominal: Soft. Bowel sounds are normal. He exhibits no distension, no abdominal bruit, no pulsatile midline mass and no mass. There is no tenderness. There is no CVA tenderness. No hernia.  Musculoskeletal: Normal range of motion. He exhibits no edema.  Negative SLR bilaterally. No pain with movement.   Neurological: He is alert and oriented to person, place, and time. He has normal strength. No cranial nerve deficit or sensory deficit.  Reflex Scores:      Bicep reflexes are 2+ on the right side and 2+ on the left side.      Patellar reflexes are 2+ on the right side and 2+ on the left side. Skin: Skin is warm and dry. No rash noted. He is not diaphoretic.  Psychiatric: He has a normal mood and affect. His behavior is  normal.    ED Course  Procedures (including critical care time) Labs Review Labs Reviewed  URINALYSIS, ROUTINE W REFLEX MICROSCOPIC - Abnormal; Notable for the following:    Hgb urine dipstick TRACE (*)  Ketones, ur 15 (*)    Protein, ur 30 (*)    All other components within normal limits  POCT I-STAT, CHEM 8 - Abnormal; Notable for the following:    Glucose, Bld 156 (*)    Calcium, Ion 1.11 (*)    All other components within normal limits  URINE MICROSCOPIC-ADD ON   Imaging Review Ct Abdomen Pelvis Wo Contrast  12/27/2012   CLINICAL DATA:  Persistent lower back pain; increased urinary frequency.  EXAM: CT ABDOMEN AND PELVIS WITHOUT CONTRAST  TECHNIQUE: Multidetector CT imaging of the abdomen and pelvis was performed following the standard protocol without intravenous contrast.  COMPARISON:  None.  FINDINGS: The visualized lung bases are clear.  A 1.5 cm hypodensity is noted at the medial right hepatic lobe; this is nonspecific but could reflect a small cyst. The liver is otherwise unremarkable. The spleen is within normal limits. The gallbladder is within normal limits. The pancreas and adrenal glands are unremarkable.  Multiple hypodensities are seen at the left kidney, measuring up to 3.6 cm in size. These are thought most likely to reflect cysts. Lymphoma is considered less likely. An apparent tiny 1 mm calcification is noted at the interpole region of the left kidney. There is partial right renal atrophy and scarring. Nonspecific perinephric stranding is noted bilaterally. No obstructing ureteral stones are seen; there is no evidence of hydronephrosis.  No free fluid is identified. The small bowel is unremarkable in appearance. The stomach is within normal limits. No acute vascular abnormalities are seen. Minimal calcification is noted along the abdominal aorta.  The appendix is normal in caliber, without evidence for appendicitis. The colon is unremarkable in appearance.  The bladder is  mildly distended and grossly unremarkable; a small urachal remnant is incidentally seen. The prostate remains normal in size. No inguinal lymphadenopathy is seen.  No acute osseous abnormalities are identified. The patient's left hip prosthesis is incompletely imaged but appears grossly unremarkable.  IMPRESSION: 1. Multiple hypodensities at the left kidney, measuring up to 3.6 cm in size. These are thought most likely to reflect cysts. Lymphoma is considered much less likely, though there are no prior studies available for comparison, and evaluation is mildly suboptimal without contrast. If the patient's symptoms persist, further evaluation could be considered. 2. Apparent tiny 1 mm calcification at the interpole region of the left kidney. No obstructing ureteral stone seen; no evidence of hydronephrosis. 3. Partial right renal atrophy and scarring. 4. Nonspecific 1.5 cm hypodensity at the medial right hepatic lobe; this could reflect a small cyst, though correlation with LFTs could be considered.   Electronically Signed   By: Roanna Raider M.D.   On: 12/27/2012 01:19    EKG Interpretation   None       MDM   1. LBP (low back pain)    -Pain does not appear muscular in nature. No signs of radiculopathy. The patient has no upper back or neck pain, no fever, no hx of cancer, IVDU, recent spinal procedures, weakness or numbness of the lower extremities and no urinary retention or incontinence.  -Transabdominal ultrasound shows no signs of abdominal aortic aneurysm.  -UA shows trace hgb, ketones, and protein. No signs of UTI. All other labs normal.  -CT shows  Multiple hypodensities at the left kidney, measuring up to 3.6 cm in size. Small 1 mm calcification at the interpole region of left kidney. No obstructing ureteral stone seen; no evidence of hydronephrosis. Partial right renal atrophy and scarring.   Nonspecific  1.5 cm hypodensity at the medial right hepatic lobe.   Patient educated about  findings. Discussed possibility of Shingles due to appropriate age/hx, though no evidence of rash on exam. Discussed followup with PCP within 2 days. Patient given pain. Discussed use of medications. Patient agrees with plan. Pain controlled in ED. Patient discharged in good condition. Recommend return to ED if patient sxs should worsen. Patient discussed with Dr. Eber Hong.    Meds given in ED:  Medications  ondansetron (ZOFRAN) injection 4 mg (4 mg Intravenous Given 12/26/12 2353)  ketorolac (TORADOL) 15 MG/ML injection 30 mg (30 mg Intravenous Given 12/26/12 2353)  sodium chloride 0.9 % bolus 500 mL (0 mLs Intravenous Stopped 12/27/12 0040)  morphine 2 MG/ML injection 2 mg (2 mg Intravenous Given 12/27/12 0130)  oxyCODONE-acetaminophen (PERCOCET/ROXICET) 5-325 MG per tablet 2 tablet (2 tablets Oral Given 12/27/12 0232)    Discharge Medication List as of 12/27/2012  2:03 AM    START taking these medications   Details  naproxen (NAPROSYN) 500 MG tablet Take 1 tablet (500 mg total) by mouth 2 (two) times daily with a meal., Starting 12/27/2012, Until Discontinued, Print    ondansetron (ZOFRAN ODT) 4 MG disintegrating tablet Take 1 tablet (4 mg total) by mouth every 8 (eight) hours as needed for nausea., Starting 12/27/2012, Until Discontinued, Print    oxyCODONE-acetaminophen (PERCOCET) 5-325 MG per tablet Take 1 tablet by mouth every 4 (four) hours as needed., Starting 12/27/2012, Until Discontinued, Print    tamsulosin (FLOMAX) 0.4 MG CAPS capsule Take 1 capsule (0.4 mg total) by mouth 2 (two) times daily., Starting 12/27/2012, Until Discontinued, Print           Allen Norris [place], [place] 12/27/12 [phone]

## 2012-12-26 NOTE — ED Notes (Signed)
Pt reports this afternoon was sitting and turned in chair, and now having L lower back pain; reports it is constant, nothing makes it worse or better; took asa 3 hours ago for pain with no relief; reports over past 4-5 weeks having increased urinary frequency but denies color change or odor, denies radiation to L leg

## 2012-12-27 ENCOUNTER — Emergency Department (HOSPITAL_COMMUNITY): Payer: Medicare Other

## 2012-12-27 ENCOUNTER — Encounter (HOSPITAL_COMMUNITY): Payer: Self-pay | Admitting: Radiology

## 2012-12-27 LAB — POCT I-STAT, CHEM 8
BUN: 22 mg/dL (ref 6–23)
Calcium, Ion: 1.11 mmol/L — ABNORMAL LOW (ref 1.13–1.30)
Chloride: 103 mEq/L (ref 96–112)
Glucose, Bld: 156 mg/dL — ABNORMAL HIGH (ref 70–99)
TCO2: 24 mmol/L (ref 0–100)

## 2012-12-27 MED ORDER — OXYCODONE-ACETAMINOPHEN 5-325 MG PO TABS: 1.0000 | ORAL_TABLET | ORAL | Status: DC | PRN

## 2012-12-27 MED ORDER — OXYCODONE-ACETAMINOPHEN 5-325 MG PO TABS
2.0000 | ORAL_TABLET | Freq: Once | ORAL | Status: AC
  Administered 2012-12-27: 2 via ORAL
  Filled 2012-12-27: qty 2

## 2012-12-27 MED ORDER — TAMSULOSIN HCL 0.4 MG PO CAPS: 0.4000 mg | ORAL_CAPSULE | Freq: Two times a day (BID) | ORAL | Status: DC

## 2012-12-27 MED ORDER — NAPROXEN 500 MG PO TABS: 500.0000 mg | ORAL_TABLET | Freq: Two times a day (BID) | ORAL | Status: DC

## 2012-12-27 MED ORDER — ONDANSETRON 4 MG PO TBDP: 4.0000 mg | ORAL_TABLET | Freq: Three times a day (TID) | ORAL | Status: DC | PRN

## 2012-12-27 MED ORDER — MORPHINE SULFATE 2 MG/ML IJ SOLN
2.0000 mg | Freq: Once | INTRAMUSCULAR | Status: AC
  Administered 2012-12-27: 2 mg via INTRAVENOUS
  Filled 2012-12-27: qty 1

## 2012-12-27 NOTE — ED Provider Notes (Signed)
67 year old male who presents with new onset left lower back pain which he describes as sharp and stabbing, was acute in onset multiple hours ago today, associated with nausea, occasionally diaphoretic and has noted increased urinary frequency over the last couple of weeks. He denies any other pathologic red flags for back pain, has no abdominal pain and has no difficulty ambulating. Nothing seems to make this better or worse, it is relatively unrelieved throughout the day.  On my exam he has a normal neurologic exam to his lower extremities with strength, sensation and coordination. He has no reproducible tenderness to palpation with rotation, flexion or CVA tenderness. His abdomen is completely benign, soft and nontender with no palpable pulsating masses. A bedside ultrasound reveals no abdominal aortic aneurysm. At this point I would continue to keep kidney stone in the differential diagnosis, it is not appear to be musculoskeletal or neurologic and again he is no pathologic red light. Pursue CT scan noncontrast, urinalysis and renal function. Patient agreeable to plan  EMERGENCY DEPARTMENT ULTRASOUND  Study: Limited Retroperitoneal Ultrasound of the Abdominal Aorta.  INDICATIONS:Back pain  Multiple views of the abdominal aorta were obtained in real-time from the diaphragmatic hiatus to the aortic bifurcation in transverse planes with a multi-frequency probe.  PERFORMED BY: Myself  IMAGES ARCHIVED?: Yes  FINDINGS: Maximum aortic dimensions are 3.0  LIMITATIONS: Body habitus and Bowel gas  INTERPRETATION: No abdominal aortic aneurysm and Abdominal free fluid absent  Medical screening examination/treatment/procedure(s) were conducted as a shared visit with non-physician practitioner(s) and myself. I personally evaluated the patient during the encounter.   Vida Roller, MD 12/27/12 [phone]

## 2013-02-05 ENCOUNTER — Other Ambulatory Visit: Payer: Self-pay | Admitting: *Deleted

## 2013-02-05 MED ORDER — METOPROLOL SUCCINATE ER 100 MG PO TB24: 100.0000 mg | ORAL_TABLET | Freq: Every evening | ORAL | Status: DC

## 2013-02-05 MED ORDER — HYDROCHLOROTHIAZIDE 25 MG PO TABS: 25.0000 mg | ORAL_TABLET | Freq: Every evening | ORAL | Status: DC

## 2013-03-19 ENCOUNTER — Other Ambulatory Visit: Payer: Self-pay | Admitting: Cardiovascular Disease

## 2013-03-19 NOTE — Telephone Encounter (Signed)
E sentrx 

## 2013-09-04 ENCOUNTER — Other Ambulatory Visit: Payer: Self-pay

## 2013-09-04 MED ORDER — HYDROCHLOROTHIAZIDE 25 MG PO TABS: 25.0000 mg | ORAL_TABLET | Freq: Every evening | ORAL | Status: DC

## 2013-09-04 MED ORDER — METOPROLOL SUCCINATE ER 100 MG PO TB24
100.0000 mg | ORAL_TABLET | Freq: Every evening | ORAL | Status: DC
Start: 2013-09-04 — End: 2013-12-19

## 2013-09-04 NOTE — Telephone Encounter (Signed)
meds sent for refill

## 2013-10-09 ENCOUNTER — Telehealth: Payer: Self-pay | Admitting: *Deleted

## 2013-10-09 NOTE — Telephone Encounter (Signed)
Faxed CPAP supply order for nasal cannula and tubing to [facility].

## 2013-10-22 ENCOUNTER — Other Ambulatory Visit: Payer: Self-pay | Admitting: *Deleted

## 2013-10-22 MED ORDER — AMLODIPINE BESY-BENAZEPRIL HCL 10-40 MG PO CAPS: ORAL_CAPSULE | ORAL | Status: DC

## 2013-10-22 NOTE — Telephone Encounter (Signed)
Refilled electronically to the pharmacy

## 2013-10-24 ENCOUNTER — Ambulatory Visit: Payer: Medicare Other | Admitting: Cardiovascular Disease

## 2013-11-27 ENCOUNTER — Ambulatory Visit: Payer: Medicare Other | Admitting: Cardiovascular Disease

## 2013-12-17 ENCOUNTER — Other Ambulatory Visit: Payer: Self-pay | Admitting: *Deleted

## 2013-12-17 ENCOUNTER — Telehealth: Payer: Self-pay | Admitting: Cardiovascular Disease

## 2013-12-17 DIAGNOSIS — R5383 Other fatigue: Secondary | ICD-10-CM

## 2013-12-17 DIAGNOSIS — E785 Hyperlipidemia, unspecified: Secondary | ICD-10-CM

## 2013-12-17 DIAGNOSIS — Z79899 Other long term (current) drug therapy: Secondary | ICD-10-CM

## 2013-12-17 NOTE — Telephone Encounter (Signed)
Labs sent

## 2013-12-17 NOTE — Telephone Encounter (Signed)
Pt called in stating that he has an appt with Dr. Claiborne Billings on 12/2 and would like to come and pick up an order for his labs today since he has already fasted this morning. Please call asap  Thanks

## 2013-12-18 LAB — COMPLETE METABOLIC PANEL WITH GFR
ALBUMIN: 4.4 g/dL (ref 3.5–5.2)
ALT: 31 U/L (ref 0–53)
AST: 27 U/L (ref 0–37)
Alkaline Phosphatase: 53 U/L (ref 39–117)
BUN: 22 mg/dL (ref 6–23)
CALCIUM: 9.4 mg/dL (ref 8.4–10.5)
CHLORIDE: 104 meq/L (ref 96–112)
CO2: 27 mEq/L (ref 19–32)
CREATININE: 1.22 mg/dL (ref 0.50–1.35)
GFR, EST AFRICAN AMERICAN: 70 mL/min
GFR, EST NON AFRICAN AMERICAN: 61 mL/min
GLUCOSE: 97 mg/dL (ref 70–99)
POTASSIUM: 4.2 meq/L (ref 3.5–5.3)
Sodium: 140 mEq/L (ref 135–145)
Total Bilirubin: 1.2 mg/dL (ref 0.2–1.2)
Total Protein: 6.9 g/dL (ref 6.0–8.3)

## 2013-12-18 LAB — LIPID PANEL
CHOLESTEROL: 225 mg/dL — AB (ref 0–200)
HDL: 45 mg/dL (ref >39)
LDL Cholesterol: 156 mg/dL — ABNORMAL HIGH (ref 0–99)
TRIGLYCERIDES: 121 mg/dL (ref <150)
Total CHOL/HDL Ratio: 5 Ratio
VLDL: 24 mg/dL (ref 0–40)

## 2013-12-18 LAB — CBC WITH DIFFERENTIAL/PLATELET
Basophils Absolute: 0 10*3/uL (ref 0.0–0.1)
Basophils Relative: 0 % (ref 0–1)
EOS PCT: 2 % (ref 0–5)
Eosinophils Absolute: 0.1 10*3/uL (ref 0.0–0.7)
HCT: 45.3 % (ref 39.0–52.0)
Hemoglobin: 15.7 g/dL (ref 13.0–17.0)
LYMPHS ABS: 1.5 10*3/uL (ref 0.7–4.0)
LYMPHS PCT: 29 % (ref 12–46)
MCH: 30.4 pg (ref 26.0–34.0)
MCHC: 34.7 g/dL (ref 30.0–36.0)
MCV: 87.8 fL (ref 78.0–100.0)
MPV: 9.7 fL (ref 9.4–12.4)
Monocytes Absolute: 0.5 10*3/uL (ref 0.1–1.0)
Monocytes Relative: 9 % (ref 3–12)
Neutro Abs: 3.1 10*3/uL (ref 1.7–7.7)
Neutrophils Relative %: 60 % (ref 43–77)
PLATELETS: 209 10*3/uL (ref 150–400)
RBC: 5.16 MIL/uL (ref 4.22–5.81)
RDW: 13.8 % (ref 11.5–15.5)
WBC: 5.1 10*3/uL (ref 4.0–10.5)

## 2013-12-19 ENCOUNTER — Ambulatory Visit (INDEPENDENT_AMBULATORY_CARE_PROVIDER_SITE_OTHER): Payer: Medicare Other | Admitting: Cardiovascular Disease

## 2013-12-19 ENCOUNTER — Encounter: Payer: Self-pay | Admitting: Cardiovascular Disease

## 2013-12-19 VITALS — BP 150/100 | HR 87 | Ht 75.0 in | Wt 266.0 lb

## 2013-12-19 DIAGNOSIS — G4733 Obstructive sleep apnea (adult) (pediatric): Secondary | ICD-10-CM

## 2013-12-19 DIAGNOSIS — Z9989 Dependence on other enabling machines and devices: Secondary | ICD-10-CM

## 2013-12-19 DIAGNOSIS — I10 Essential (primary) hypertension: Secondary | ICD-10-CM

## 2013-12-19 DIAGNOSIS — E785 Hyperlipidemia, unspecified: Secondary | ICD-10-CM

## 2013-12-19 DIAGNOSIS — E782 Mixed hyperlipidemia: Secondary | ICD-10-CM

## 2013-12-19 DIAGNOSIS — E669 Obesity, unspecified: Secondary | ICD-10-CM | POA: Insufficient documentation

## 2013-12-19 MED ORDER — AMLODIPINE BESY-BENAZEPRIL HCL 10-40 MG PO CAPS: ORAL_CAPSULE | ORAL | Status: DC

## 2013-12-19 MED ORDER — METOPROLOL SUCCINATE ER 100 MG PO TB24: 100.0000 mg | ORAL_TABLET | Freq: Every evening | ORAL | Status: DC

## 2013-12-19 MED ORDER — HYDROCHLOROTHIAZIDE 25 MG PO TABS: 25.0000 mg | ORAL_TABLET | Freq: Every morning | ORAL | Status: DC

## 2013-12-19 MED ORDER — ROSUVASTATIN CALCIUM 10 MG PO TABS: ORAL_TABLET | ORAL | Status: DC

## 2013-12-19 MED ORDER — HYDROCHLOROTHIAZIDE 25 MG PO TABS: 25.0000 mg | ORAL_TABLET | Freq: Every evening | ORAL | Status: DC

## 2013-12-19 NOTE — Patient Instructions (Signed)
Your physician has recommended you make the following change in your medication: restart crestor as instructed on the bottle. A prescription has been sent to your pharmacy. Change the fluid pill to in the morning.   Your physician recommends that you return for lab work in: 3 months.  Your physician recommends that you schedule a follow-up appointment in: 4 months.

## 2013-12-19 NOTE — Progress Notes (Addendum)
Patient ID: Clayton [name], male   DOB: [birth year], 68 y.o.   MRN: [id number]     HPI: Clayton [name] is a 68 y.o. male who presents for an 38 month follow-up cardiology followup evaluation.  Clayton [name] has a history of hypertension, a remote history of PAF, status post cardioversion in 2007, obstructive sleep apnea for which he uses CPAP 100% of the time, hypertension, and hyperlipidemia. He has had weight fluctuations over the years.  In the past, he also had a history of Mnire's disease.  There was some concern by [name] that perhaps this may have been related to statin use, which led to its discontinuance.  He is not had Mnire's disease in some time.  He underwent left hip replacement surgery by Dr. [name] and now has been able to continue to be active and exercises routinely.   Over the past 18 months, he has been on a blood pressure regimen consisting of amlodipine/benazepril 10/40 daily, hydrochlorothiazide has been taking 25 Mill grams in the evening and Toprol-XL 100 mg daily.  I last saw him in July 2014.  We gave him a trial of little low but apparently had developed some diarrhea secondary to this.  Remotely, he also had been onset.  He appeared apparently over the past year.  He is not on any lipid-lowering medication.  He continues to be active.  He walks 3/2 miles daily.  He also is been golfing at least 2 times per week and does do occasional workouts at the [facility].  He has not had much success with weight loss over the past 18 months.  He states his weight has fluctuated.  When I had last seen him, he weighed 252 pounds.  He states his weight had dropped to 45 but then due to significant travel and other reasons.  His weight increased to 78.  He subsequently lost and is now at 266.  He continues to use his CPAP therapy with 100% compliance.  He will not even take a nap without his CPAP therapy and he takes his CPAP unit on alll his travels.   Since initiating CPAP therapy, he  is unaware of any palpitations.  He denies any recurrent atrial fibrillation.  He had laboratory performed 2 days ago.  Glucose was 97.  Renal function and liver function studies were normal.  His lipids were abnormal with a total cholesterol of 225, LDL cholesterol of 156.  HDL was 45 and triglycerides 121.  He presents for evaluation.   Past Medical History  Diagnosis Date  . Sleep apnea 09/14/04    [facility]- Dr. Humphrey Rolls; CPAP titration 11/12/04- Dr. Humphrey Rolls  . Arthritis     hips   . Meniere disease   . Dysrhythmia     PAF- hx of 7 years ago   . Hypertension 05/29/10    ECHO-EF 67% wnl    Past Surgical History  Procedure Laterality Date  . Knee arthroscopy      left knee- 2002   . Total hip arthroplasty  06/08/2011    Procedure: TOTAL HIP ARTHROPLASTY ANTERIOR APPROACH;  Surgeon: [name], MD;  Location: [facility];  Service: Orthopedics;  Laterality: Left;  . Cardiac catheterization  11/03/05    Allergies  Allergen Reactions  . Statins     Current Outpatient Prescriptions  Medication Sig Dispense Refill  . amLODipine-benazepril (LOTREL) 10-40 MG per capsule TAKE 1 CAPSULE BY MOUTH DAILY. Must keep appointment for future refills. [place]  capsule 1  . aspirin 325 MG tablet Take 1 tablet (325 mg total) by mouth 2 (two) times daily.    . Boswellia-Glucosamine-Vit D (GLUCOSAMINE COMPLEX PO) Take 1,500 tablets by mouth every evening.     . hydrochlorothiazide (HYDRODIURIL) 25 MG tablet Take 1 tablet (25 mg total) by mouth every evening. 30 tablet 3  . metoprolol succinate (TOPROL-XL) 100 MG 24 hr tablet Take 1 tablet (100 mg total) by mouth every evening. Take with or immediately following a meal. 30 tablet 3  . Multiple Vitamin (MULITIVITAMIN WITH MINERALS) TABS Take 1 tablet by mouth every evening.     Clayton [name] 3-6-9 Fatty Acids (OMEGA-3-6-9 PO) Take 1 capsule by mouth every evening. Omega 3= 800mg ,omega 6=380mg ,omega 9 =300mg     . vitamin C (ASCORBIC ACID) 500 MG  tablet Take 500 mg by mouth every evening.     . zinc gluconate 50 MG tablet Take 50 mg by mouth every evening.     Marland Kitchen Zn-Pyg Afri-Nettle-Saw Palmet (SAW PALMETTO COMPLEX PO) Take 1 capsule by mouth every evening.      No current facility-administered medications for this visit.    Socially he is remarried for 8 years per his first wife passed away. 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. there is no tobacco history.  He exercises daily and walks 3 miles per day.  ROS General: Negative; No fevers, chills, or night sweats;  HEENT: Negative; No changes in vision or hearing, sinus congestion, difficulty swallowing Pulmonary: Negative; No cough, wheezing, shortness of breath, hemoptysis Cardiovascular: Negative; No chest pain, presyncope, syncope, palpitations GI: Negative; No nausea, vomiting, diarrhea, or abdominal pain GU: Negative; No dysuria, hematuria, or difficulty voiding Musculoskeletal: Negative; no myalgias, joint pain, or weakness Hematologic/Oncology: Negative; no easy bruising, bleeding Endocrine: Negative; no heat/cold intolerance; no diabetes Neuro: No vertigo or imbalance issues Skin: Negative; No rashes or skin lesions Psychiatric: Negative; No behavioral problems, depression Sleep: Positive for obstructive sleep apnea on CPAP therapy with 100% compliance No snoring, daytime sleepiness, hypersomnolence, bruxism, restless legs, hypnogognic hallucinations, no cataplexy Other comprehensive 14 point system review is negative.  PE BP 150/100 mmHg  Pulse 87  Ht 6\' 3"  (1.905 m)  Wt 266 lb (120.657 kg)  BMI 33.25 kg/m2 Since I last saw him in July 2014 there is a 14 pound weight gain Repeat blood pressure by me was 145/95 General: Alert, oriented, no distress.  Skin: normal turgor, no rashes HEENT: Normocephalic, atraumatic. Pupils round and reactive; sclera anicteric;no lid lag.  Nose without nasal septal  hypertrophy Mouth/Parynx benign; Mallinpatti scale 3 Neck: Thick neck; No JVD, no carotid bruits with normal carotid upstroke Lungs: clear to ausculatation and percussion; no wheezing or rales Chest wall: Nontender to palpation  Heart: RRR, s1 s2 normal; 1/95 systolic murmur, no diastolic murmur.  No rubs thrills or heaves.  No S3 or S4 gallop Abdomen: Moderate central adiposity;soft, nontender; no hepatosplenomehaly, BS+; abdominal aorta nontender and not dilated by palpation. Back: No CVA tenderness Pulses 2+ Extremities: no clubbing cyanosis or edema, Homan's sign negative  Neurologic: grossly nonfocal  ECG (independently read by me): Normal sinus rhythm at 87 bpm.  LVH by voltage criteria with T-wave changes in leads 1 and aVL.  July 2014 ECG: Normal sinus rhythm at 76 beats per minute. LVH with QRS widening.  LABS:  BMET    Component Value Date/Time   NA 140 12/17/2013 1033   K 4.2 12/17/2013 1033  CL 104 12/17/2013 1033   CO2 27 12/17/2013 1033   GLUCOSE 97 12/17/2013 1033   BUN 22 12/17/2013 1033   CREATININE 1.22 12/17/2013 1033   CREATININE 1.30 12/27/2012 0002   CALCIUM 9.4 12/17/2013 1033   GFRNONAA 61 12/17/2013 1033   GFRNONAA 55* 06/10/2011 0417   GFRAA 70 12/17/2013 1033   GFRAA 64* 06/10/2011 0417     Hepatic Function Panel     Component Value Date/Time   PROT 6.9 12/17/2013 1033     CBC    Component Value Date/Time   WBC 5.1 12/17/2013 1033   RBC 5.16 12/17/2013 1033   HGB 15.7 12/17/2013 1033   HCT 45.3 12/17/2013 1033   PLT 209 12/17/2013 1033   MCV 87.8 12/17/2013 1033   MCH 30.4 12/17/2013 1033   MCHC 34.7 12/17/2013 1033   RDW 13.8 12/17/2013 1033   LYMPHSABS 1.5 12/17/2013 1033   MONOABS 0.5 12/17/2013 1033   EOSABS 0.1 12/17/2013 1033   BASOSABS 0.0 12/17/2013 1033     BNP No results found for: PROBNP   Lipid Panel     Component Value Date/Time   CHOL 225* 12/17/2013 1039   TRIG 121 12/17/2013 1039   HDL 45 12/17/2013  1039   CHOLHDL 5.0 12/17/2013 1039   VLDL 24 12/17/2013 1039   LDLCALC 156* 12/17/2013 1039     RADIOLOGY: No results found.    ASSESSMENT AND PLAN: Clayton [name] is now 68 years old and has a long-standing history of hypertension.  His blood pressure today was improved at 130/88.  We'll recheck by me on his current regimen consisting of amlodipine/benazepril 10/40, HCTZ 25 mg and Toprol-XL 50 mg.  He apparently has been taking the HCTZ at nighttime and consequently wakes up at least 2 times per night for urination.  I recommended he change this to take this in the morning.  His weight today is increased from 252 pounds at his last office visit.  Remotely, he had gone his weight down to 225 pounds in 2006.  We again discussed the importance of weight loss.  His fasting blood sugar is normal.  I have reviewed his recent laboratory.  He now has significant lipid elevation.  In the past.  He had tolerated Crestor, but there was concerned perhaps that may have been possibly related to his Mnire's but this was uncertain.  He did not tolerate Livalo due to GI issues.  Remotely he had tolerated study.  A.  Presently, I am recommending a rechallenge of low dose statin therapy and will start him on Crestor 10 mg which she will take every third day for the first 1-2 weeks.  He will then increase this to every other day if tolerated.  After approximately 1 month of therapy we will change this to 10 mg daily.  In 3 months a repeat chemistry profile and lipid studies will be obtained..  We discussed weight loss.  He is using his CPAP with 100% compliance and feels that CPAP therapy has been a major benefit to him.  He's not having any further atrial fibrillation or arrhythmia.  I will see him in 4 months for reevaluation and further recommendations will be made at that time.  Time spent: 30 minutes   [name], MD, [facility]  12/19/2013 2:29 PM

## 2014-01-05 ENCOUNTER — Other Ambulatory Visit: Payer: Self-pay | Admitting: Cardiovascular Disease

## 2014-01-07 NOTE — Telephone Encounter (Signed)
Rx was sent to pharmacy electronically. 

## 2014-01-22 ENCOUNTER — Telehealth: Payer: Self-pay | Admitting: *Deleted

## 2014-01-22 NOTE — Telephone Encounter (Signed)
Returned CPAP supply order to [facility]. 

## 2014-08-22 ENCOUNTER — Telehealth: Payer: Self-pay | Admitting: Cardiovascular Disease

## 2014-08-22 NOTE — Telephone Encounter (Signed)
Patient states he is having some issues with palpations.

## 2014-08-22 NOTE — Telephone Encounter (Signed)
Spoke with pt, he reports a history of palpitations usually once a year. For the last week he has had an occ light palpitation, chattering of his teeth and light vertigo. This will occur at least once a day and usually noticed when he is at rest. Denies cp or sob, he is more tired usual. Will discuss with dr Claiborne Billings

## 2014-08-22 NOTE — Telephone Encounter (Signed)
Discussed with dr Claiborne Billings, no change at this time. Follow up scheduled

## 2014-08-27 ENCOUNTER — Telehealth: Payer: Self-pay | Admitting: Cardiovascular Disease

## 2014-08-27 DIAGNOSIS — E785 Hyperlipidemia, unspecified: Secondary | ICD-10-CM

## 2014-08-27 LAB — COMPREHENSIVE METABOLIC PANEL
ALK PHOS: 60 U/L (ref 40–115)
ALT: 22 U/L (ref 9–46)
AST: 23 U/L (ref 10–35)
Albumin: 4.3 g/dL (ref 3.6–5.1)
BUN: 23 mg/dL (ref 7–25)
CO2: 27 mmol/L (ref 20–31)
CREATININE: 1.22 mg/dL (ref 0.70–1.25)
Calcium: 9.1 mg/dL (ref 8.6–10.3)
Chloride: 103 mmol/L (ref 98–110)
Glucose, Bld: 104 mg/dL — ABNORMAL HIGH (ref 65–99)
Potassium: 3.9 mmol/L (ref 3.5–5.3)
Sodium: 142 mmol/L (ref 135–146)
TOTAL PROTEIN: 6.6 g/dL (ref 6.1–8.1)
Total Bilirubin: 0.9 mg/dL (ref 0.2–1.2)

## 2014-08-27 LAB — CBC WITH DIFFERENTIAL/PLATELET
BASOS ABS: 0 10*3/uL (ref 0.0–0.1)
Basophils Relative: 0 % (ref 0–1)
Eosinophils Absolute: 0.1 10*3/uL (ref 0.0–0.7)
Eosinophils Relative: 1 % (ref 0–5)
HEMATOCRIT: 44.1 % (ref 39.0–52.0)
Hemoglobin: 15.3 g/dL (ref 13.0–17.0)
Lymphocytes Relative: 29 % (ref 12–46)
Lymphs Abs: 1.5 10*3/uL (ref 0.7–4.0)
MCH: 30.7 pg (ref 26.0–34.0)
MCHC: 34.7 g/dL (ref 30.0–36.0)
MCV: 88.4 fL (ref 78.0–100.0)
MPV: 10.3 fL (ref 8.6–12.4)
Monocytes Absolute: 0.5 10*3/uL (ref 0.1–1.0)
Monocytes Relative: 9 % (ref 3–12)
Neutro Abs: 3.1 10*3/uL (ref 1.7–7.7)
Neutrophils Relative %: 61 % (ref 43–77)
PLATELETS: 199 10*3/uL (ref 150–400)
RBC: 4.99 MIL/uL (ref 4.22–5.81)
RDW: 13.6 % (ref 11.5–15.5)
WBC: 5 10*3/uL (ref 4.0–10.5)

## 2014-08-27 LAB — LIPID PANEL
Cholesterol: 197 mg/dL (ref 125–200)
HDL: 44 mg/dL (ref >=40)
LDL CALC: 135 mg/dL — AB (ref <130)
Total CHOL/HDL Ratio: 4.5 Ratio (ref <=5.0)
Triglycerides: 89 mg/dL (ref <150)
VLDL: 18 mg/dL (ref <30)

## 2014-08-27 NOTE — Telephone Encounter (Signed)
Patient is to have appointment with Dr. Claiborne Billings on 8/15.  Is able to have lab work drawn today before appointment.  Lipid panel, CBC, C Met ordered.  Patient will be in this morning to lab to complete

## 2014-08-27 NOTE — Telephone Encounter (Signed)
Pt seeing Dr Claiborne Billings on [address] he need lab work before appt? If so,would you please send lab order downstairs today.

## 2014-09-02 ENCOUNTER — Ambulatory Visit (INDEPENDENT_AMBULATORY_CARE_PROVIDER_SITE_OTHER): Payer: Medicare PPO | Admitting: Cardiovascular Disease

## 2014-09-02 VITALS — BP 138/94 | HR 77 | Ht 75.0 in | Wt 255.6 lb

## 2014-09-02 DIAGNOSIS — I10 Essential (primary) hypertension: Secondary | ICD-10-CM

## 2014-09-02 DIAGNOSIS — E785 Hyperlipidemia, unspecified: Secondary | ICD-10-CM

## 2014-09-02 DIAGNOSIS — G4733 Obstructive sleep apnea (adult) (pediatric): Secondary | ICD-10-CM | POA: Diagnosis not present

## 2014-09-02 DIAGNOSIS — Z79899 Other long term (current) drug therapy: Secondary | ICD-10-CM | POA: Diagnosis not present

## 2014-09-02 DIAGNOSIS — E669 Obesity, unspecified: Secondary | ICD-10-CM

## 2014-09-02 DIAGNOSIS — Z9989 Dependence on other enabling machines and devices: Secondary | ICD-10-CM

## 2014-09-02 MED ORDER — HYDROCHLOROTHIAZIDE 25 MG PO TABS: 25.0000 mg | ORAL_TABLET | Freq: Every morning | ORAL | Status: DC

## 2014-09-02 MED ORDER — ROSUVASTATIN CALCIUM 10 MG PO TABS: ORAL_TABLET | ORAL | Status: DC

## 2014-09-02 MED ORDER — AMLODIPINE BESY-BENAZEPRIL HCL 10-40 MG PO CAPS: ORAL_CAPSULE | ORAL | Status: DC

## 2014-09-02 MED ORDER — METOPROLOL SUCCINATE ER 100 MG PO TB24: 100.0000 mg | ORAL_TABLET | Freq: Every morning | ORAL | Status: DC

## 2014-09-02 NOTE — Patient Instructions (Signed)
Your physician recommends that you return for lab work in: 3 MONTHS.  Your physician wants you to follow-up in: 6 MONTHS You will receive a reminder letter in the mail two months in advance. If you don't receive a letter, please call our office to schedule the follow-up appointment.  Your physician has recommended you make the following change in your medication: start new prescriptions for CoQ10 and generic crestor 10 mg as directed.

## 2014-09-04 ENCOUNTER — Encounter: Payer: Self-pay | Admitting: Cardiovascular Disease

## 2014-09-04 NOTE — Progress Notes (Signed)
Patient ID: Clayton [name], male   DOB: 1945/02/23, 69 y.o.   MRN: [id number]     HPI: [name] is a 69 y.o. male who presents for a 9 month follow-up cardiology followup evaluation.  Mr. Goytia has a history of hypertension, a remote history of PAF, status post cardioversion in 2007, obstructive sleep apnea for which he uses CPAP 100% of the time, hypertension, and hyperlipidemia. He has had weight fluctuations over the years.  In the past, he also had a history of Mnire's disease.  There was some concern by [name] that perhaps this may have been related to statin use, which led to its discontinuance.  He is not had Mnire's disease in some time.  He underwent left hip replacement surgery by Dr. [name] and now has been able to continue to be active and exercises routinely.   His  blood pressure regimen has been amlodipine/benazepril 10/40 daily, hydrochlorothiazide has been taking 25 Mill grams in the evening and Toprol-XL 100 mg daily. He had atrial of livalo  For hyperlipidemia but developed some diarrhea secondary to this.    He continues to be active.  He walks 3/2 miles daily.  He also is been golfing at least 2 times per week and does do occasional workouts at the [facility].  He has not had much success with weight loss over the past 18 months.  He states his weight has fluctuated.  When I had last seen him, he weighed 252 pounds.  He states his weight had dropped to 45 but then due to significant travel and other reasons.  His weight increased to 78.  He subsequently lost and is now at 266.  He continues to use his CPAP therapy with 100% compliance.  He will not even take a nap without his CPAP therapy and he takes his CPAP unit on alll his travels.   Since initiating CPAP therapy, he is unaware of any palpitations.  He denies any recurrent atrial fibrillation.  He had lost approximate 25 pounds.  However, he had tweaked his right knee which resulted in significantly reduced  activity for 6 weeks.  He is gained approximately 10 pounds back.  He does play occasional golf.  He denies chest pain, PND orthopnea.  He is unaware of any palpitations.  Last week.  Repeat lipid panel revealed a total cholesterol 197, triglycerides 89, HDL 44, LDL 135.  This was improved from 8 months previously, but still elevated.  He's not been taking lipid lowering therapy.    Past Medical History  Diagnosis Date  . Sleep apnea 09/14/04    [facility]- Dr. Humphrey Rolls; CPAP titration 11/12/04- Dr. Humphrey Rolls  . Arthritis     hips   . Meniere disease   . Dysrhythmia     PAF- hx of 7 years ago   . Hypertension 05/29/10    ECHO-EF 67% wnl    Past Surgical History  Procedure Laterality Date  . Knee arthroscopy      left knee- 2002   . Total hip arthroplasty  06/08/2011    Procedure: TOTAL HIP ARTHROPLASTY ANTERIOR APPROACH;  Surgeon: [name], MD;  Location: [facility];  Service: Orthopedics;  Laterality: Left;  . Cardiac catheterization  11/03/05    Allergies  Allergen Reactions  . Statins     Current Outpatient Prescriptions  Medication Sig Dispense Refill  . amLODipine-benazepril (LOTREL) 10-40 MG per capsule TAKE 1 CAPSULE BY MOUTH EVERY MORNING 90 capsule 3  .  aspirin 325 MG tablet Take 1 tablet (325 mg total) by mouth 2 (two) times daily.    . Boswellia-Glucosamine-Vit D (GLUCOSAMINE COMPLEX PO) Take 1,500 tablets by mouth every evening.     . hydrochlorothiazide (HYDRODIURIL) 25 MG tablet Take 1 tablet (25 mg total) by mouth every morning. 90 tablet 3  . metoprolol succinate (TOPROL-XL) 100 MG 24 hr tablet Take 1 tablet (100 mg total) by mouth every morning. Take with or immediately following a meal. 90 tablet 3  . Multiple Vitamin (MULITIVITAMIN WITH MINERALS) TABS Take 1 tablet by mouth every evening.     [name] 3-6-9 Fatty Acids (OMEGA-3-6-9 PO) Take 1 capsule by mouth every evening. Omega 3= 875m,omega 6=3861momega 9 =30053m  . vitamin C (ASCORBIC ACID)  500 MG tablet Take 500 mg by mouth every evening.     . zinc gluconate 50 MG tablet Take 50 mg by mouth every evening.     . ZMarland Kitchen-Pyg Afri-Nettle-Saw Palmet (SAW PALMETTO COMPLEX PO) Take 1 capsule by mouth every evening.     . rosuvastatin (CRESTOR) 10 MG tablet Take 1 tablet 3 times per week 36 tablet 3   No current facility-administered medications for this visit.    Socially he is remarried for 8 years per his first wife passed away. 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  He exercises daily and walks 3 miles per day.  ROS General: Negative; No fevers, chills, or night sweats;  HEENT: Negative; No changes in vision or hearing, sinus congestion, difficulty swallowing Pulmonary: Negative; No cough, wheezing, shortness of breath, hemoptysis Cardiovascular: Negative; No chest pain, presyncope, syncope, palpitations GI: Negative; No nausea, vomiting, diarrhea, or abdominal pain GU: Negative; No dysuria, hematuria, or difficulty voiding Musculoskeletal: Negative; no myalgias, joint pain, or weakness Hematologic/Oncology: Negative; no easy bruising, bleeding Endocrine: Negative; no heat/cold intolerance; no diabetes Neuro: No vertigo or imbalance issues Skin: Negative; No rashes or skin lesions Psychiatric: Negative; No behavioral problems, depression Sleep: Positive for obstructive sleep apnea on CPAP therapy with 100% compliance No snoring, daytime sleepiness, hypersomnolence, bruxism, restless legs, hypnogognic hallucinations, no cataplexy Other comprehensive 14 point system review is negative.  PE BP 138/94 mmHg  Pulse 77  Ht 6' 3"  (1.905 m)  Wt 255 lb 9.6 oz (115.939 kg)  BMI 31.95 kg/m2 Repeat blood pressure by me was 148/86  Wt Readings from Last 3 Encounters:  09/02/14 255 lb 9.6 oz (115.939 kg)  12/19/13 266 lb (120.657 kg)  08/17/12 252 lb 11.2 oz (114.624 kg)   General: Alert, oriented, no  distress.  Skin: normal turgor, no rashes HEENT: Normocephalic, atraumatic. Pupils round and reactive; sclera anicteric;no lid lag.  Nose without nasal septal hypertrophy Mouth/Parynx benign; Mallinpatti scale 3 Neck: Thick neck; No JVD, no carotid bruits with normal carotid upstroke Lungs: clear to ausculatation and percussion; no wheezing or rales Chest wall: Nontender to palpation  Heart: RRR, s1 s2 normal; 1/6 systolic murmur, no diastolic murmur.  No rubs thrills or heaves.  No S3 or S4 gallop Abdomen: Moderate central adiposity;soft, nontender; no hepatosplenomehaly, BS+; abdominal aorta nontender and not dilated by palpation. Back: No CVA tenderness Pulses 2+ Extremities: no clubbing cyanosis or edema, Homan's sign negative  Neurologic: grossly nonfocal Psychological: Normal affect and mood  ECG (independently read by me): Normal sinus rhythm at 77 bpm.  LVH with repolarization changes.  Left axis deviation.  December 2015 ECG (independently read  by me): Normal sinus rhythm at 87 bpm.  LVH by voltage criteria with T-wave changes in leads 1 and aVL.  July 2014 ECG: Normal sinus rhythm at 76 beats per minute. LVH with QRS widening.  LABS:  BMP Latest Ref Rng 08/27/2014 12/17/2013 12/27/2012  Glucose 65 - 99 mg/dL 104(H) 97 156(H)  BUN 7 - 25 mg/dL 23 22 22   Creatinine 0.70 - 1.25 mg/dL 1.22 1.22 1.30  Sodium 135 - 146 mmol/L 142 140 137  Potassium 3.5 - 5.3 mmol/L 3.9 4.2 3.5  Chloride 98 - 110 mmol/L 103 104 103  CO2 20 - 31 mmol/L 27 27 -  Calcium 8.6 - 10.3 mg/dL 9.1 9.4 -   Hepatic Function Latest Ref Rng 08/27/2014 12/17/2013  Total Protein 6.1 - 8.1 g/dL 6.6 6.9  Albumin 3.6 - 5.1 g/dL 4.3 4.4  AST 10 - 35 U/L 23 27  ALT 9 - 46 U/L 22 31  Alk Phosphatase 40 - 115 U/L 60 53  Total Bilirubin 0.2 - 1.2 mg/dL 0.9 1.2   CBC Latest Ref Rng 08/27/2014 12/17/2013 12/27/2012  WBC 4.0 - 10.5 K/uL 5.0 5.1 -  Hemoglobin 13.0 - 17.0 g/dL 15.3 15.7 16.3  Hematocrit 39.0 - 52.0 %  44.1 45.3 48.0  Platelets 150 - 400 K/uL 199 209 -   Lab Results  Component Value Date   MCV 88.4 08/27/2014   MCV 87.8 12/17/2013   MCV 87.2 06/10/2011   No results found for: TSH No results found for: HGBA1C  Lipid Panel     Component Value Date/Time   CHOL 197 08/27/2014 1030   TRIG 89 08/27/2014 1030   HDL 44 08/27/2014 1030   CHOLHDL 4.5 08/27/2014 1030   VLDL 18 08/27/2014 1030   LDLCALC 135* 08/27/2014 1030      RADIOLOGY: No results found.    ASSESSMENT AND PLAN: Mr Eichelberger is a 69 year old WM who has a long-standing history of hypertension.  His blood pressure today is upper normal and has been controlled on amlodipine/benazepril 10/40, HCTZ 25 mg and Toprol-XL 100 mg. he is now been taking his hydrochlorothiazide in the morning, which has improved his prior nocturia.  He also has been taking the amlodipine at night and I suggested that he take this in the morning as well, but he would continue to take his Toprol-XL at bedtime.  He has lost at least 11 pounds since his last office visit, but admits to previously losing 25 pounds prior to tweaking his right knee which resulted in reduced activity.  He is now able to walk well and has been increasing his exercise capacity.  I reviewed his recent laboratory.  He does have hyperlipidemia.  I am not certain if the Crestor caused his Mnire's.  I have suggested a rechallenge of Crestor to take 10 mg 3 times per week with the addition of coenzyme Q10 200-300 mg.  We discussed further weight loss.  He has  not had any recurrent atrial fibrillation.  He continues to use CPAP 100% of the time including any nap.  He takes and will not travel without it.  As long as he continues to do well.  I have recommended follow-up laboratory in 3 months and I will see him in 6 months for cardiology reevaluation.    Time spent: 25 minutes   [name], MD, [facility]  09/04/2014 2:05 PM

## 2014-09-25 ENCOUNTER — Encounter: Payer: Self-pay | Admitting: Cardiovascular Disease

## 2015-06-09 ENCOUNTER — Telehealth: Payer: Self-pay | Admitting: Family Medicine

## 2015-06-09 ENCOUNTER — Encounter: Payer: Self-pay | Admitting: Family Medicine

## 2015-06-09 ENCOUNTER — Ambulatory Visit (INDEPENDENT_AMBULATORY_CARE_PROVIDER_SITE_OTHER): Payer: Medicare Other | Admitting: Family Medicine

## 2015-06-09 VITALS — BP 147/80 | HR 75 | Temp 98.1°F | Resp 20 | Ht 75.0 in | Wt 264.8 lb

## 2015-06-09 DIAGNOSIS — R7303 Prediabetes: Secondary | ICD-10-CM

## 2015-06-09 DIAGNOSIS — E785 Hyperlipidemia, unspecified: Secondary | ICD-10-CM

## 2015-06-09 DIAGNOSIS — R7309 Other abnormal glucose: Secondary | ICD-10-CM

## 2015-06-09 DIAGNOSIS — I10 Essential (primary) hypertension: Secondary | ICD-10-CM

## 2015-06-09 DIAGNOSIS — Z6833 Body mass index (BMI) 33.0-33.9, adult: Secondary | ICD-10-CM

## 2015-06-09 DIAGNOSIS — H8102 Meniere's disease, left ear: Secondary | ICD-10-CM

## 2015-06-09 DIAGNOSIS — Z7189 Other specified counseling: Secondary | ICD-10-CM

## 2015-06-09 DIAGNOSIS — H8109 Meniere's disease, unspecified ear: Secondary | ICD-10-CM | POA: Insufficient documentation

## 2015-06-09 DIAGNOSIS — Z6832 Body mass index (BMI) 32.0-32.9, adult: Secondary | ICD-10-CM | POA: Insufficient documentation

## 2015-06-09 DIAGNOSIS — Z7689 Persons encountering health services in other specified circumstances: Secondary | ICD-10-CM

## 2015-06-09 HISTORY — DX: Meniere's disease, left ear: H81.02

## 2015-06-09 LAB — TSH: TSH: 0.6 u[IU]/mL (ref 0.35–4.50)

## 2015-06-09 LAB — HEMOGLOBIN A1C: HEMOGLOBIN A1C: 6 % (ref 4.6–6.5)

## 2015-06-09 NOTE — Progress Notes (Addendum)
Patient ID: Clayton [name], male   DOB: 1945-02-16, 70 y.o.   MRN: [id number]      Patient ID: Clayton [name], male  DOB: 1945/07/03, 70 y.o.   MRN: [id number]  Subjective:  Clayton [name] is a 70 y.o. male present for establish care.  All past medical history, surgical history, allergies, family history, immunizations, medications and social history were obtained and entered in the electronic medical record today. All recent labs, ED visits and hospitalizations within the last year were reviewed.  Patient Care Team    Relationship Specialty Notifications Start End  Clayton Hillock, DO PCP - General Family Medicine  06/09/15   [name], MD Consulting Physician Cardiology  06/09/15   Rana Snare, MD Consulting Physician Urology  06/09/15   Warden Fillers, MD Consulting Physician Ophthalmology  06/09/15   Sharyne Peach, MD Consulting Physician Ophthalmology  06/09/15   Sydnee Levans, MD Consulting Physician Dermatology  06/09/15   Vicie Mutters, MD Consulting Physician Otolaryngology  06/09/15   Paralee Cancel, MD Consulting Physician Orthopedic Surgery  06/09/15     Patient here to obtain a PCP. He is mostly concerned about his weight gain and inability to lose weight, as well as the repercussions it can have on his general health. He has a family history of heart disease, stroke and diabetes which also has him concerned. He desires to lose weight safely and become more healthy. He has knee pain if he over does it. He currently is not following a diet, but is interested in exercise and nutrition counseling. He has had mildly elevated fasting glucose on prior labs. He has not had a thyroid study. His BMI >33. He is followed by cardiology for hyperlipidemia, BP, OSA (on CPAP).  Health maintenance:  Colonoscopy:[facility]- normal, unknown date. Requesting records.  Immunizations: unknown Infectious disease screening: Unknown PSA: followed yearly by urology- Dr. Risa Grill.   Past  Medical History  Diagnosis Date  . Sleep apnea 09/14/04    [facility]- Dr. Humphrey Rolls; CPAP titration 11/12/04- Dr. Humphrey Rolls  . Arthritis     hips   . Meniere disease   . Dysrhythmia     PAF- hx of 7 years ago   . Hypertension 05/29/10    ECHO-EF 67% wnl  . Cataract   . Atrial fibrillation (HCC)    Allergies  Allergen Reactions  . Statins    Past Surgical History  Procedure Laterality Date  . Knee arthroscopy      left knee- 2002   . Total hip arthroplasty  06/08/2011    Procedure: TOTAL HIP ARTHROPLASTY ANTERIOR APPROACH;  Surgeon: [name], MD;  Location: [facility];  Service: Orthopedics;  Laterality: Left;  . Cardiac catheterization  11/03/05  . Cataract extraction  2014   Family History  Problem Relation Age of Onset  . Hypertension Mother   . Stroke Mother   . Diabetes Mother   . Diabetes Sister   . Stroke Maternal Grandmother     64 died   . Heart disease Maternal Grandfather   . Hypertension Maternal Grandfather   . Diabetes Maternal Grandfather   . Heart disease Paternal Grandfather   . Arthritis Father    Social History   Social History  . Marital Status: Married    Spouse Name: N/A  . Number of Children: N/A  . Years of Education: N/A   Occupational History  . Not on file.   Social History Main Topics  . Smoking  status: Never Smoker   . Smokeless tobacco: Never Used  . Alcohol Use: No  . Drug Use: No  . Sexual Activity: Yes    Birth Control/ Protection: None   Other Topics Concern  . Not on file   Social History Narrative   Married. [name].Takes care of his mother-in-law as well.    Retired, Conservator, museum/gallery.    Drinks caffeine beverages. Takes a daily vitamin.   Exercises routinely.    Smoke detector in the home.    Ambulates independently.         Medication List       This list is accurate as of: 06/09/15  1:00 PM.  Always use your most recent med list.               amLODipine-benazepril 10-40 MG capsule    Commonly known as:  LOTREL  TAKE 1 CAPSULE BY MOUTH EVERY MORNING     aspirin 325 MG tablet  Take 1 tablet (325 mg total) by mouth 2 (two) times daily.     co-enzyme Q-10 30 MG capsule  Take 30 mg by mouth 3 (three) times daily.     GLUCOSAMINE COMPLEX PO  Take 1,500 tablets by mouth every evening.     hydrochlorothiazide 25 MG tablet  Commonly known as:  HYDRODIURIL  Take 1 tablet (25 mg total) by mouth every morning.     metoprolol succinate 100 MG 24 hr tablet  Commonly known as:  TOPROL-XL  Take 1 tablet (100 mg total) by mouth every morning. Take with or immediately following a meal.     multivitamin with minerals Tabs tablet  Take 1 tablet by mouth every evening.     OMEGA-3-6-9 PO  Take 1 capsule by mouth every evening. Omega 3= 800mg ,omega 6=380mg ,omega 9 =300mg      rosuvastatin 10 MG tablet  Commonly known as:  CRESTOR  Take 1 tablet 3 times per week     SAW PALMETTO COMPLEX PO  Take 1 capsule by mouth every evening.     vitamin C 500 MG tablet  Commonly known as:  ASCORBIC ACID  Take 500 mg by mouth every evening.     zinc gluconate 50 MG tablet  Take 50 mg by mouth every evening.        ROS: Negative, with the exception of above mentioned in HPI  Objective: BP 147/80 mmHg  Pulse 75  Temp(Src) 98.1 F (36.7 C) (Oral)  Resp 20  Ht 6\' 3"  (1.905 m)  Wt 264 lb 12 oz (120.09 kg)  BMI 33.09 kg/m2  SpO2 96% Gen: Afebrile. No acute distress. Nontoxic in appearance, well-developed, well-nourished, male, talkative HENT: AT. [place]. Bilateral TM visualized and normal in appearance, normal external auditory canal. MMM, no oral lesions. Eyes:Pupils Equal Round Reactive to light, Extraocular movements intact,  Conjunctiva without redness, discharge or icterus. Neck/lymp/endocrine: Supple, no lymphadenopathy, No  thyromegaly CV: RRR, trace edema Chest: CTAB, no wheeze, rhonchi or crackles.  Abd: Soft. NTND. BS present Skin:  Warm and well-perfused. Skin  intact. Neuro/Msk: Normal gait. PERLA. EOMi. Alert. Oriented x3.   Psych: Normal affect, dress and demeanor. Normal speech. Normal thought content and judgment.  Assessmnt/plan: Clayton [name] is a 70 y.o. male present for establishment of care. Meniere disease, left - continue to follow with ENT  Elevated glucose - FH present, obese, elevated fasting glucose in the past.  - HgB A1c  BMI 33.0-33.9,adult - Nutritional referral, Referral to PREP program once a1c  returned.  - exercise discussed, > 150 minutes a week - A1c - TSH  Hyperlipidemia - followed by cardiology, intolerance to statins  Essential hypertension - mildly elevated today - continue with cardiology   Health maintenance:  Colonoscopy: completed by [facility] physician- normal, unknown date. Requesting records.  Immunizations: unknown Infectious disease screening: Unknown PSA: followed yearly by urology- Dr. Risa Grill.  - tdap script for patient to take to pharmacy. Discussed likely fee.  - pt will need PNA series started at CPE/medicare visit - pt will need Hep C screen offered.  return for MW/CPE within 3 months.   Greater than 45 minutes was spent with patient, greater than 50% of that time was spent face-to-face with patient counseling and coordinating care.  Electronically signed by: Howard Pouch, DO [place]

## 2015-06-09 NOTE — Patient Instructions (Signed)
We will test your blood today for thyroid and a1c (diabetes).  I will print your tdap (tetanus immunizations) . Shop around for this, co-pay can be very different at different pharmacies. Try [facility].   Calorie Counting for Weight Loss Calories are energy you get from the things you eat and drink. Your body uses this energy to keep you going throughout the day. The number of calories you eat affects your weight. When you eat more calories than your body needs, your body stores the extra calories as fat. When you eat fewer calories than your body needs, your body burns fat to get the energy it needs. Calorie counting means keeping track of how many calories you eat and drink each day. If you make sure to eat fewer calories than your body needs, you should lose weight. In order for calorie counting to work, you will need to eat the number of calories that are right for you in a day to lose a healthy amount of weight per week. A healthy amount of weight to lose per week is usually 1-2 lb (0.5-0.9 kg). A dietitian can determine how many calories you need in a day and give you suggestions on how to reach your calorie goal.   WHAT DO I NEED TO KNOW ABOUT CALORIE COUNTING? In order to meet your daily calorie goal, you will need to:  Find out how many calories are in each food you would like to eat. Try to do this before you eat.  Decide how much of the food you can eat.  Write down what you ate and how many calories it had. Doing this is called keeping a food log. WHERE DO I FIND CALORIE INFORMATION? The number of calories in a food can be found on a Nutrition Facts label. Note that all the information on a label is based on a specific serving of the food. If a food does not have a Nutrition Facts label, try to look up the calories online or ask your dietitian for help. HOW DO I DECIDE HOW MUCH TO EAT? To decide how much of the food you can eat, you will need to consider both the number of calories in one  serving and the size of one serving. This information can be found on the Nutrition Facts label. If a food does not have a Nutrition Facts label, look up the information online or ask your dietitian for help. Remember that calories are listed per serving. If you choose to have more than one serving of a food, you will have to multiply the calories per serving by the amount of servings you plan to eat. For example, the label on a package of bread might say that a serving size is 1 slice and that there are 90 calories in a serving. If you eat 1 slice, you will have eaten 90 calories. If you eat 2 slices, you will have eaten 180 calories. HOW DO I KEEP A FOOD LOG? After each meal, record the following information in your food log:  What you ate.  How much of it you ate.  How many calories it had.  Then, add up your calories. Keep your food log near you, such as in a small notebook in your pocket. Another option is to use a mobile app or website. Some programs will calculate calories for you and show you how many calories you have left each time you add an item to the log. WHAT ARE SOME CALORIE COUNTING TIPS?  Use your calories on foods and drinks that will fill you up and not leave you hungry. Some examples of this include foods like nuts and nut butters, vegetables, lean proteins, and high-fiber foods (more than 5 g fiber per serving).  Eat nutritious foods and avoid empty calories. Empty calories are calories you get from foods or beverages that do not have many nutrients, such as candy and soda. It is better to have a nutritious high-calorie food (such as an avocado) than a food with few nutrients (such as a bag of chips).  Know how many calories are in the foods you eat most often. This way, you do not have to look up how many calories they have each time you eat them.  Look out for foods that may seem like low-calorie foods but are really high-calorie foods, such as baked goods, soda, and  fat-free candy.  Pay attention to calories in drinks. Drinks such as sodas, specialty coffee drinks, alcohol, and juices have a lot of calories yet do not fill you up. Choose low-calorie drinks like water and diet drinks.  Focus your calorie counting efforts on higher calorie items. Logging the calories in a garden salad that contains only vegetables is less important than calculating the calories in a milk shake.  Find a way of tracking calories that works for you. Get creative. Most people who are successful find ways to keep track of how much they eat in a day, even if they do not count every calorie. WHAT ARE SOME PORTION CONTROL TIPS?  Know how many calories are in a serving. This will help you know how many servings of a certain food you can have.  Use a measuring cup to measure serving sizes. This is helpful when you start out. With time, you will be able to estimate serving sizes for some foods.  Take some time to put servings of different foods on your favorite plates, bowls, and cups so you know what a serving looks like.  Try not to eat straight from a bag or box. Doing this can lead to overeating. Put the amount you would like to eat in a cup or on a plate to make sure you are eating the right portion.  Use smaller plates, glasses, and bowls to prevent overeating. This is a quick and easy way to practice portion control. If your plate is smaller, less food can fit on it.  Try not to multitask while eating, such as watching TV or using your computer. If it is time to eat, sit down at a table and enjoy your food. Doing this will help you to start recognizing when you are full. It will also make you more aware of what and how much you are eating. HOW CAN I CALORIE COUNT WHEN EATING OUT?  Ask for smaller portion sizes or child-sized portions.  Consider sharing an entree and sides instead of getting your own entree.  If you get your own entree, eat only half. Ask for a box at the  beginning of your meal and put the rest of your entree in it so you are not tempted to eat it.  Look for the calories on the menu. If calories are listed, choose the lower calorie options.  Choose dishes that include vegetables, fruits, whole grains, low-fat dairy products, and lean protein. Focusing on smart food choices from each of the 5 food groups can help you stay on track at restaurants.  Choose items that are boiled, broiled,  grilled, or steamed.  Choose water, milk, unsweetened iced tea, or other drinks without added sugars. If you want an alcoholic beverage, choose a lower calorie option. For example, a regular margarita can have up to 700 calories and a glass of wine has around 150.  Stay away from items that are buttered, battered, fried, or served with cream sauce. Items labeled "crispy" are usually fried, unless stated otherwise.  Ask for dressings, sauces, and syrups on the side. These are usually very high in calories, so do not eat much of them.  Watch out for salads. Many people think salads are a healthy option, but this is often not the case. Many salads come with bacon, fried chicken, lots of cheese, fried chips, and dressing. All of these items have a lot of calories. If you want a salad, choose a garden salad and ask for grilled meats or steak. Ask for the dressing on the side, or ask for olive oil and vinegar or lemon to use as dressing.  Estimate how many servings of a food you are given. For example, a serving of cooked rice is  cup or about the size of half a tennis ball or one cupcake wrapper. Knowing serving sizes will help you be aware of how much food you are eating at restaurants. The list below tells you how big or small some common portion sizes are based on everyday objects.  1 oz--4 stacked dice.  3 oz--1 deck of cards.  1 tsp--1 dice.  1 Tbsp-- a Ping-Pong ball.  2 Tbsp--1 Ping-Pong ball.   cup--1 tennis ball or 1 cupcake wrapper.  1 cup--1  baseball.   This information is not intended to replace advice given to you by your health care provider. Make sure you discuss any questions you have with your health care provider.   Document Released: 01/04/2005 Document Revised: 01/25/2014 Document Reviewed: 11/09/2012 Elsevier Interactive Patient Education [employer].

## 2015-06-09 NOTE — Telephone Encounter (Signed)
Please call pt: - his thyroid is functioning normal.  - His a1c (diabetes screen) is elevated to 6.0 (<5.5 normal, >6.5 diabetes). This places him in a "prediabetes" range and needs to be monitored every 6 months. I  Have placed the referral to nutrition we discussed today. - I would encourage to increase the exercise and consider the PREP program (information given today).  - He can decrease sugar and carbohydrate consumption in the meantime, this will be explained more detail with nutrition referral.   - pt should be encouraged to schedule CPE, so that we can get him updated on all screenings and immunizations.

## 2015-06-10 NOTE — Telephone Encounter (Signed)
Left message for patient to return call.

## 2015-06-10 NOTE — Telephone Encounter (Signed)
Spoke  with patient reviewed results and instructions with patient. Patient verbalized understanding. Patient will call back to schedule CPE .

## 2015-07-16 ENCOUNTER — Telehealth: Payer: Self-pay | Admitting: Cardiovascular Disease

## 2015-07-16 NOTE — Telephone Encounter (Signed)
Returned call. Pt aware Dr. Claiborne Billings has not set up for October schedule, OK for return yearly visit. Added to waitlist, patient aware.  Regarding chest tightness, pt voices no concern - has a history of costochondritis/muscle pulls, he remains physically active. Was wanting a chest X ray and to see Dr. Claiborne Billings for routine yearly visit as a "two for one". Chest discomfort is mild, intermittent, occurrent for about 2 months, sternal, relieved by rub/massage, and pt voices no concern for SOB, radiating pain, or other symptoms. He is aware to go to ER in event of worse/changing symptoms and call if new concerns or questions. His plan is to have CXR/eval w PCP.  No further concerns. Pt voiced thanks for call and assistance.

## 2015-07-16 NOTE — Telephone Encounter (Signed)
ok 

## 2015-07-16 NOTE — Telephone Encounter (Signed)
Pt calling re some chest tightness-denies any other symptoms-asking what to do-Dr.Kelly's schedule full-pt needs fu due in August -pls advise

## 2015-07-16 NOTE — Telephone Encounter (Signed)
fyi

## 2015-08-27 ENCOUNTER — Ambulatory Visit: Payer: Self-pay | Admitting: Family Medicine

## 2015-09-09 ENCOUNTER — Ambulatory Visit: Payer: Medicare Other | Admitting: Family Medicine

## 2015-09-19 ENCOUNTER — Ambulatory Visit: Payer: Medicare Other | Admitting: Cardiovascular Disease

## 2015-09-29 ENCOUNTER — Other Ambulatory Visit: Payer: Self-pay | Admitting: Cardiovascular Disease

## 2015-09-29 NOTE — Telephone Encounter (Signed)
REFILL 

## 2015-10-29 ENCOUNTER — Ambulatory Visit: Payer: Medicare Other | Admitting: Cardiovascular Disease

## 2015-12-08 ENCOUNTER — Other Ambulatory Visit: Payer: Self-pay | Admitting: Cardiovascular Disease

## 2015-12-08 LAB — COMPREHENSIVE METABOLIC PANEL
ALBUMIN: 4.3 g/dL (ref 3.6–5.1)
ALT: 25 U/L (ref 9–46)
AST: 22 U/L (ref 10–35)
Alkaline Phosphatase: 51 U/L (ref 40–115)
BUN: 21 mg/dL (ref 7–25)
CHLORIDE: 103 mmol/L (ref 98–110)
CO2: 30 mmol/L (ref 20–31)
Calcium: 9.4 mg/dL (ref 8.6–10.3)
Creat: 1.06 mg/dL (ref 0.70–1.25)
Glucose, Bld: 112 mg/dL — ABNORMAL HIGH (ref 65–99)
POTASSIUM: 3.8 mmol/L (ref 3.5–5.3)
Sodium: 139 mmol/L (ref 135–146)
TOTAL PROTEIN: 6.8 g/dL (ref 6.1–8.1)
Total Bilirubin: 1 mg/dL (ref 0.2–1.2)

## 2015-12-08 LAB — CBC
HEMATOCRIT: 44.1 % (ref 38.5–50.0)
Hemoglobin: 15.2 g/dL (ref 13.2–17.1)
MCH: 31.2 pg (ref 27.0–33.0)
MCHC: 34.5 g/dL (ref 32.0–36.0)
MCV: 90.6 fL (ref 80.0–100.0)
MPV: 9.6 fL (ref 7.5–12.5)
Platelets: 194 10*3/uL (ref 140–400)
RBC: 4.87 MIL/uL (ref 4.20–5.80)
RDW: 13.6 % (ref 11.0–15.0)
WBC: 5.4 10*3/uL (ref 3.8–10.8)

## 2015-12-08 LAB — LIPID PANEL
CHOL/HDL RATIO: 4.9 ratio (ref <5.0)
CHOLESTEROL: 211 mg/dL — AB (ref <200)
HDL: 43 mg/dL (ref >40)
LDL Cholesterol: 143 mg/dL — ABNORMAL HIGH (ref <100)
TRIGLYCERIDES: 123 mg/dL (ref <150)
VLDL: 25 mg/dL (ref <30)

## 2015-12-08 LAB — TSH: TSH: 1.28 mIU/L (ref 0.40–4.50)

## 2015-12-09 ENCOUNTER — Encounter: Payer: Self-pay | Admitting: Cardiovascular Disease

## 2015-12-09 ENCOUNTER — Ambulatory Visit (INDEPENDENT_AMBULATORY_CARE_PROVIDER_SITE_OTHER): Payer: Medicare Other | Admitting: Cardiovascular Disease

## 2015-12-09 VITALS — BP 156/98 | HR 78 | Ht 75.0 in | Wt 268.0 lb

## 2015-12-09 DIAGNOSIS — R7303 Prediabetes: Secondary | ICD-10-CM

## 2015-12-09 DIAGNOSIS — R7309 Other abnormal glucose: Secondary | ICD-10-CM | POA: Diagnosis not present

## 2015-12-09 DIAGNOSIS — I10 Essential (primary) hypertension: Secondary | ICD-10-CM | POA: Diagnosis not present

## 2015-12-09 DIAGNOSIS — E782 Mixed hyperlipidemia: Secondary | ICD-10-CM

## 2015-12-09 DIAGNOSIS — E669 Obesity, unspecified: Secondary | ICD-10-CM

## 2015-12-09 DIAGNOSIS — G4733 Obstructive sleep apnea (adult) (pediatric): Secondary | ICD-10-CM

## 2015-12-09 DIAGNOSIS — Z9989 Dependence on other enabling machines and devices: Secondary | ICD-10-CM

## 2015-12-09 MED ORDER — METOPROLOL SUCCINATE ER 100 MG PO TB24: 100.0000 mg | ORAL_TABLET | Freq: Every evening | ORAL | 3 refills | Status: DC

## 2015-12-09 MED ORDER — ASPIRIN EC 81 MG PO TBEC: 81.0000 mg | DELAYED_RELEASE_TABLET | Freq: Every day | ORAL | 3 refills | Status: DC

## 2015-12-09 MED ORDER — HYDROCHLOROTHIAZIDE 25 MG PO TABS: 25.0000 mg | ORAL_TABLET | Freq: Every morning | ORAL | 3 refills | Status: DC

## 2015-12-09 MED ORDER — SPIRONOLACTONE 25 MG PO TABS: 12.5000 mg | ORAL_TABLET | Freq: Every day | ORAL | 3 refills | Status: DC

## 2015-12-09 MED ORDER — EZETIMIBE 10 MG PO TABS: 10.0000 mg | ORAL_TABLET | Freq: Every day | ORAL | 3 refills | Status: DC

## 2015-12-09 MED ORDER — AMLODIPINE BESY-BENAZEPRIL HCL 10-40 MG PO CAPS: 1.0000 | ORAL_CAPSULE | Freq: Every evening | ORAL | 3 refills | Status: DC

## 2015-12-09 NOTE — Patient Instructions (Signed)
Your physician has recommended you make the following change in your medication:   1.) the aspirin has been decreased to 81 mg daily.  2.) start new prescription for spironolactone.  Your physician recommends that you return for lab work in: 3 months.  Your physician recommends that you schedule a follow-up appointment in: 3 months.  Your physician encouraged you to lose weight for better health.

## 2015-12-14 NOTE — Progress Notes (Signed)
Patient ID: Clayton [name], male   DOB: 02/16/1945, 70 y.o.   MRN: [id number]     HPI: Clayton [name] is a 70 y.o. male who presents for a 15 month follow-up cardiology followup evaluation.  Clayton [name] has a history of hypertension, a remote history of PAF, status post cardioversion in 2007, obstructive sleep apnea for which he uses CPAP 100% of the time, hypertension, and hyperlipidemia. He has had weight fluctuations over the years.  In the past, he also had a history of Mnire's disease.  There was some concern by [name] that perhaps this may have been related to statin use, which led to its discontinuance.  He is not had Mnire's disease in some time.  He underwent left hip replacement surgery by Dr. [name] and now has been able to continue to be active and exercises routinely.   His  blood pressure regimen has been amlodipine/benazepril 10/40 daily, hydrochlorothiazide has been taking 25 Mill grams in the evening and Toprol-XL 100 mg daily. He had atrial of livalo  For hyperlipidemia but developed some diarrhea secondary to this.    He has not had much success with weight loss over the past several years.    He continues to use his CPAP therapy with 100% compliance.  He will not even take a nap without his CPAP therapy and he takes his CPAP unit on alll his travels.   Since initiating CPAP therapy, he is unaware of any palpitations.  He denies any recurrent atrial fibrillation.  Since I last saw him, he has had difficulty with family stress and his father had passed away, and his wife's mother recently died after having developed lung CA and had significant PVD.  He was not exercising as much as he had in the past.  He has not been successful in weight loss.  He continues to be active and still walks 3-4 miles per day.  Oftentimes in the morning when he awakens his blood pressure is elevated at 150/90 but after exercise it drops to 128/80.  He is unaware of any recurrent atrial  fibrillation.  He continues to use CPAP with 100% compliance.  He presents for evaluation.  Past Medical History:  Diagnosis Date  . Arthritis    hips   . Atrial fibrillation ([place])   . Cataract   . Dysrhythmia    PAF- hx of 7 years ago   . Hypertension 05/29/10   ECHO-EF 67% wnl  . Meniere disease   . Sleep apnea 09/14/04    [facility]- Dr. Humphrey Rolls; CPAP titration 11/12/04- Dr. Humphrey Rolls    Past Surgical History:  Procedure Laterality Date  . CARDIAC CATHETERIZATION  11/03/05  . CATARACT EXTRACTION  2014  . KNEE ARTHROSCOPY     left knee- 2002   . TOTAL HIP ARTHROPLASTY  06/08/2011   Procedure: TOTAL HIP ARTHROPLASTY ANTERIOR APPROACH;  Surgeon: [name], MD;  Location: [facility];  Service: Orthopedics;  Laterality: Left;    Allergies  Allergen Reactions  . Statins     Current Outpatient Prescriptions  Medication Sig Dispense Refill  . amLODipine-benazepril (LOTREL) 10-40 MG capsule Take 1 capsule by mouth every evening. 90 capsule 3  . [place]-Glucosamine-Vit D (GLUCOSAMINE COMPLEX PO) Take 1,500 mg by mouth every morning.     Marland Kitchen co-enzyme Q-10 30 MG capsule Take 30 mg by mouth 3 (three) times daily.    . hydrochlorothiazide (HYDRODIURIL) 25 MG tablet Take 1 tablet (25 mg total) by mouth  every morning. 90 tablet 3  . metoprolol succinate (TOPROL-XL) 100 MG 24 hr tablet Take 1 tablet (100 mg total) by mouth every evening. Take with or immediately following a meal. 90 tablet 3  . Multiple Vitamin (MULITIVITAMIN WITH MINERALS) TABS Take 1 tablet by mouth every evening.     Clayton [name] 3-6-9 Fatty Acids (OMEGA-3-6-9 PO) Take 1 capsule by mouth every morning. Omega 3= 828m,omega 6=3844momega 9 =30034m  . vitamin C (ASCORBIC ACID) 500 MG tablet Take 500 mg by mouth every morning.     . zinc gluconate 50 MG tablet Take 50 mg by mouth every evening.     . ZMarland Kitchen-Pyg Afri-Nettle-Saw Palmet (SAW PALMETTO COMPLEX PO) Take 1 capsule by mouth every morning.     . aMarland Kitchenpirin EC 81 MG  tablet Take 1 tablet (81 mg total) by mouth daily. 90 tablet 3  . ezetimibe (ZETIA) 10 MG tablet Take 1 tablet (10 mg total) by mouth daily. 90 tablet 3  . spironolactone (ALDACTONE) 25 MG tablet Take 0.5 tablets (12.5 mg total) by mouth daily. 45 tablet 3   No current facility-administered medications for this visit.     Socially he is remarried for 8 years per his first wife passed away. 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  He exercises daily and walks 3 miles per day.  ROS General: Negative; No fevers, chills, or night sweats;  HEENT: Negative; No changes in vision or hearing, sinus congestion, difficulty swallowing Pulmonary: Negative; No cough, wheezing, shortness of breath, hemoptysis Cardiovascular: Negative; No chest pain, presyncope, syncope, palpitations GI: Negative; No nausea, vomiting, diarrhea, or abdominal pain GU: Negative; No dysuria, hematuria, or difficulty voiding Musculoskeletal: Negative; no myalgias, joint pain, or weakness Hematologic/Oncology: Negative; no easy bruising, bleeding Endocrine: Negative; no heat/cold intolerance; no diabetes Neuro: No vertigo or imbalance issues Skin: Negative; No rashes or skin lesions Psychiatric: Negative; No behavioral problems, depression Sleep: Positive for obstructive sleep apnea on CPAP therapy with 100% compliance No snoring, daytime sleepiness, hypersomnolence, bruxism, restless legs, hypnogognic hallucinations, no cataplexy Other comprehensive 14 point system review is negative.  PE BP (!) 156/98   Pulse 78   Ht 6' 3"  (1.905 m)   Wt 268 lb (121.6 kg)   BMI 33.50 kg/m    Repeat blood pressure by me was 158/100.  Wt Readings from Last 3 Encounters:  12/09/15 268 lb (121.6 kg)  06/09/15 264 lb 12 oz (120.1 kg)  09/02/14 255 lb 9.6 oz (115.9 kg)   General: Alert, oriented, no distress.  Skin: normal turgor, no rashes HEENT:  Normocephalic, atraumatic. Pupils round and reactive; sclera anicteric;no lid lag.  Nose without nasal septal hypertrophy Mouth/Parynx benign; Mallinpatti scale 3 Neck: Thick neck; No JVD, no carotid bruits with normal carotid upstroke Lungs: clear to ausculatation and percussion; no wheezing or rales Chest wall: Nontender to palpation  Heart: RRR, s1 s2 normal; 1/6 systolic murmur, no diastolic murmur.  No rubs thrills or heaves.  No S3 or S4 gallop Abdomen: Moderate central adiposity;soft, nontender; no hepatosplenomehaly, BS+; abdominal aorta nontender and not dilated by palpation. Back: No CVA tenderness Pulses 2+ Extremities: no clubbing cyanosis or edema, Homan's sign negative  Neurologic: grossly nonfocal Psychological: Normal affect and mood  ECG (independently read by me): Normal sinus rhythm at 78 bpm.  Nonspecific interventricular delay.  QRS complex V1 V2.  August 2016 ECG (independently read by me): Normal sinus  rhythm at 77 bpm.  LVH with repolarization changes.  Left axis deviation.  December 2015 ECG (independently read by me): Normal sinus rhythm at 87 bpm.  LVH by voltage criteria with T-wave changes in leads 1 and aVL.  July 2014 ECG: Normal sinus rhythm at 76 beats per minute. LVH with QRS widening.  LABS:  BMP Latest Ref Rng & Units 12/08/2015 08/27/2014 12/17/2013  Glucose 65 - 99 mg/dL 112(H) 104(H) 97  BUN 7 - 25 mg/dL 21 23 22   Creatinine 0.70 - 1.25 mg/dL 1.06 1.22 1.22  Sodium 135 - 146 mmol/L 139 142 140  Potassium 3.5 - 5.3 mmol/L 3.8 3.9 4.2  Chloride 98 - 110 mmol/L 103 103 104  CO2 20 - 31 mmol/L 30 27 27   Calcium 8.6 - 10.3 mg/dL 9.4 9.1 9.4   Hepatic Function Latest Ref Rng & Units 12/08/2015 08/27/2014 12/17/2013  Total Protein 6.1 - 8.1 g/dL 6.8 6.6 6.9  Albumin 3.6 - 5.1 g/dL 4.3 4.3 4.4  AST 10 - 35 U/L 22 23 27   ALT 9 - 46 U/L 25 22 31   Alk Phosphatase 40 - 115 U/L 51 60 53  Total Bilirubin 0.2 - 1.2 mg/dL 1.0 0.9 1.2   CBC Latest Ref Rng &  Units 12/08/2015 08/27/2014 12/17/2013  WBC 3.8 - 10.8 K/uL 5.4 5.0 5.1  Hemoglobin 13.2 - 17.1 g/dL 15.2 15.3 15.7  Hematocrit 38.5 - 50.0 % 44.1 44.1 45.3  Platelets 140 - 400 K/uL 194 199 209   Lab Results  Component Value Date   MCV 90.6 12/08/2015   MCV 88.4 08/27/2014   MCV 87.8 12/17/2013   Lab Results  Component Value Date   TSH 1.28 12/08/2015    Lab Results  Component Value Date   HGBA1C 6.0 06/09/2015    Lipid Panel     Component Value Date/Time   CHOL 211 (H) 12/08/2015 0945   TRIG 123 12/08/2015 0945   HDL 43 12/08/2015 0945   CHOLHDL 4.9 12/08/2015 0945   VLDL 25 12/08/2015 0945   LDLCALC 143 (H) 12/08/2015 0945      RADIOLOGY: No results found.    ASSESSMENT AND PLAN: Mr Frady is a 70 year old WM who has a long-standing history of hypertension and has been on amlodipine/benazepril 10/40, HCTZ 25 mg and Toprol-XL 100 mg. he is now been taking his hydrochlorothiazide in the morning, which has improved his prior nocturia.  His blood pressure today is elevated and I have suggested the addition of spironolactone 12.5 mg daily to his medical regimen for aldosterone blockade.  He has not been successful with weight loss and his weight today is 268 pounds, which places him at a BMI of 33.5, consistent with obesity.  We discussed the importance of weight loss and I last saw him, we attempted to rechallenge him with Crestor, but he was unable to tolerate this secondary to myalgias.  I have recommended the addition of Zetia 10 mg to his medical regimen.  He may ultimately be a candidate for PCSK9 inhibition due to his statin intoleranc if his lipid studies revealed an elevated on Zetia.  He has  not had any recurrent atrial fibrillation.  He continues to use CPAP 100% of the time including any nap.  He takes CPAP unit where he travels and will not travel without it.  A complete set of laboratory will be obtained.  I have suggested he decrease his aspirin from 3-25 mg 81 mg.   I discussed the  weight loss of 2 pounds per week and plan to see him in 3 months for follow up evaluation with anticipated approximately 25 pound weight loss.   Time spent: 25 minutes  [name], MD, [facility]  12/14/2015 8:46 PM

## 2016-02-12 ENCOUNTER — Encounter: Payer: Self-pay | Admitting: Family Medicine

## 2016-02-12 ENCOUNTER — Ambulatory Visit (INDEPENDENT_AMBULATORY_CARE_PROVIDER_SITE_OTHER): Payer: Medicare Other | Admitting: Family Medicine

## 2016-02-12 VITALS — BP 147/87 | HR 89 | Temp 98.7°F | Resp 20 | Ht 75.0 in | Wt 267.2 lb

## 2016-02-12 DIAGNOSIS — M9908 Segmental and somatic dysfunction of rib cage: Secondary | ICD-10-CM

## 2016-02-12 NOTE — Progress Notes (Signed)
Clayton [name] , [birth year], 71 y.o., male MRN: [id number] Patient Care Team    Relationship Specialty Notifications Start End  Ma Hillock, DO PCP - General Family Medicine  06/09/15   [name], MD Consulting Physician Cardiology  06/09/15   Rana Snare, MD Consulting Physician Urology  06/09/15   Warden Fillers, MD Consulting Physician Ophthalmology  06/09/15   Sharyne Peach, MD Consulting Physician Ophthalmology  06/09/15   Sydnee Levans, MD Consulting Physician Dermatology  06/09/15   Vicie Mutters, MD Consulting Physician Otolaryngology  06/09/15   Paralee Cancel, MD Consulting Physician Orthopedic Surgery  06/09/15     CC: chest wall pain Subjective:  She presents to acute office visit today for chest wall pain that has been present for about 9 months. He reports he had been going to a chiropractor Cecilie Lowers routinely to help with his back discomfort, and after having what sounds like an HVLA procedure, he had heard a "pop "around the place of tenderness today. He states the discomfort is intermittent, and is worse when laying on his back or placing pressure over the area. He points to the left side of his sternum as location. He has tried nothing to improve his symptoms. He states now it is getting in his head because he is concerned something wrong with him. He had mentioned it to his cardiologist 2 months ago he states he did not feel it is a cardiac issue.  Allergies  Allergen Reactions  . Statins    Social History  Substance Use Topics  . Smoking status: Never Smoker  . Smokeless tobacco: Never Used  . Alcohol use No   Past Medical History:  Diagnosis Date  . Arthritis    hips   . Atrial fibrillation ([place])   . Cataract   . Dysrhythmia    PAF- hx of 7 years ago   . Hypertension 05/29/10   ECHO-EF 67% wnl  . Meniere disease   . Sleep apnea 09/14/04   [facility]- Dr. Humphrey Rolls; CPAP titration 11/12/04- Dr. Humphrey Rolls   Past Surgical History:  Procedure  Laterality Date  . CARDIAC CATHETERIZATION  11/03/05  . CATARACT EXTRACTION  2014  . KNEE ARTHROSCOPY     left knee- 2002   . TOTAL HIP ARTHROPLASTY  06/08/2011   Procedure: TOTAL HIP ARTHROPLASTY ANTERIOR APPROACH;  Surgeon: [name], MD;  Location: [facility];  Service: Orthopedics;  Laterality: Left;   Family History  Problem Relation Age of Onset  . Hypertension Mother   . Stroke Mother   . Diabetes Mother   . Diabetes Sister   . Stroke Maternal Grandmother     35 died   . Heart disease Maternal Grandfather   . Hypertension Maternal Grandfather   . Diabetes Maternal Grandfather   . Arthritis Father   . Heart disease Paternal Grandfather    Allergies as of 02/12/2016      Reactions   Statins       Medication List       Accurate as of 02/12/16 10:06 AM. Always use your most recent med list.          amLODipine-benazepril 10-40 MG capsule Commonly known as:  LOTREL Take 1 capsule by mouth every evening.   aspirin EC 81 MG tablet Take 1 tablet (81 mg total) by mouth daily.   co-enzyme Q-10 30 MG capsule Take 30 mg by mouth 3 (three) times daily.   ezetimibe 10 MG tablet Commonly known  as:  ZETIA Take 1 tablet (10 mg total) by mouth daily.   GLUCOSAMINE COMPLEX PO Take 1,500 mg by mouth every morning.   hydrochlorothiazide 25 MG tablet Commonly known as:  HYDRODIURIL Take 1 tablet (25 mg total) by mouth every morning.   metoprolol succinate 100 MG 24 hr tablet Commonly known as:  TOPROL-XL Take 1 tablet (100 mg total) by mouth every evening. Take with or immediately following a meal.   multivitamin with minerals Tabs tablet Take 1 tablet by mouth every evening.   OMEGA-3-6-9 PO Take 1 capsule by mouth every morning. Omega 3= 800mg ,omega 6=380mg ,omega 9 =300mg    SAW PALMETTO COMPLEX PO Take 1 capsule by mouth every morning.   spironolactone 25 MG tablet Commonly known as:  ALDACTONE Take 0.5 tablets (12.5 mg total) by mouth daily.   vitamin C 500  MG tablet Commonly known as:  ASCORBIC ACID Take 500 mg by mouth every morning.   zinc gluconate 50 MG tablet Take 50 mg by mouth every evening.       No results found for this or any previous visit (from the past 24 hour(s)). No results found.   ROS: Negative, with the exception of above mentioned in HPI   Objective:  BP (!) 147/87 (BP Location: Right Arm, Patient Position: Sitting, Cuff Size: Large)   Pulse 89   Temp 98.7 F (37.1 C)   Resp 20   Ht 6\' 3"  (1.905 m)   Wt 267 lb 4 oz (121.2 kg)   SpO2 98%   BMI 33.40 kg/m  Body mass index is 33.4 kg/m. Gen: Afebrile. No acute distress. Nontoxic in appearance, well developed, well nourished. Very pleasant, talkative Caucasian male. Obese. HENT: AT. [place]. MMM Eyes:Pupils Equal Round Reactive to light, Extraocular movements intact,  Conjunctiva without redness, discharge or icterus. CV: RRR, no edema Chest: CTAB, no wheeze or crackles. Good air movement, normal resp effort.  Abd: Soft. NTND. BS present.  MSK/chest: No erythema, no bruising, no soft tissue swelling. Tenderness to palpation over fifth/sixth rib head left sternal area. Full range of motion. Skin: No rashes, purpura or petechiae.  \ Assessment/Plan: Clayton [name] is a 71 y.o. male present for acute OV for  Somatic dysfunction of rib - Discussed with patient the longevity of his symptoms would imply more than just costochondritis. However he is able to tolerate NSAIDs I would suggest he start taking scheduled NSAIDs at least for the next 5 days. The history and physical today seem consistent with somatic function of the ribs, with the possible rib head subluxation. He would benefit from osteopathic manipulation treatment, and he is agreeable for referral today. - Ambulatory referral to Sports Medicine/OMT - Follow-up when necessary   electronically signed by:  Howard Pouch, DO  [place]

## 2016-02-12 NOTE — Patient Instructions (Addendum)
Chest Wall Pain Introduction Chest wall pain is pain in or around the bones and muscles of your chest. Sometimes, an injury causes this pain. Sometimes, the cause may not be known. This pain may take several weeks or longer to get better. Follow these instructions at home: Pay attention to any changes in your symptoms. Take these actions to help with your pain:  Rest as told by your doctor.  Avoid activities that cause pain. Try not to use your chest, belly (abdominal), or side muscles to lift heavy things.  If directed, apply ice to the painful area:  Put ice in a plastic bag.  Place a towel between your skin and the bag.  Leave the ice on for 20 minutes, 2-3 times per day.  Take over-the-counter and prescription medicines only as told by your doctor.  Do not use tobacco products, including cigarettes, chewing tobacco, and e-cigarettes. If you need help quitting, ask your doctor.  Keep all follow-up visits as told by your doctor. This is important. Contact a doctor if:  You have a fever.  Your chest pain gets worse.  You have new symptoms. Get help right away if:  You feel sick to your stomach (nauseous) or you throw up (vomit).  You feel sweaty or light-headed.  You have a cough with phlegm (sputum) or you cough up blood.  You are short of breath. This information is not intended to replace advice given to you by your health care provider. Make sure you discuss any questions you have with your health care provider. Document Released: 06/23/2007 Document Revised: 06/12/2015 Document Reviewed: 04/01/2014  2017 Elsevier   I have referred you to Dr. Charlann Boxer, DO for rib dysfunction.   Try taking advil/naproxen scheduled (depending on med for dose) for an anti-inflammatory.

## 2016-02-24 NOTE — Progress Notes (Signed)
Patient ID: Clayton [name]                 DOB: [birth year]                    MRN: [id number]     HPI:  Clayton [name] is a 71 y.o. male patient referred to lipid clinic by Dr Claiborne Billings. PMH includes hypertension, AFib, hyperlipidemia and prediabetes.  Noted documented intolerance to statins includes pitavastatin and rosuvastatin. Patient presents to clinic today for evaluation of therapy options including PCSK9 inhibitors.   Current ezetimibe (zetia) therapy was initiated on 11/2015.  Patient also applied significant lifestyle changes after the holidays and denies having problem with Zetia.  Current Medications: ezetimibe 10mg  daily   Intolerances: pitavastatin 2mg  , rosuvastatin 10mg  daily and rosuvastatin 10mg  3x/week - middle ear problems (worsening of Mnire's disease) and lower intestines problems (crampping , diarrhea, etc).  Risk Factors: HTN, hyperlipidemia, pre-diabetes  LDL goal: <100  Diet: [place] with weight loss ~17 lbs so far  Exercise: walks 3.5 miles per day and stationary bike 30 minutes 3x/week  Family History: daibetes, "hardeding of arteries", mother died at age 37yo of cardiac problems  Social History: denies tobacco use  Labs:  CHO 211; TG 123; HDL 43; LDL 143 (Nov/20/2017)  Past Medical History:  Diagnosis Date  . Arthritis    hips   . Atrial fibrillation ([place])   . Cataract   . Dysrhythmia    PAF- hx of 7 years ago   . Hypertension 05/29/10   ECHO-EF 67% wnl  . Meniere disease   . Sleep apnea 09/14/04   [facility]- Dr. Humphrey Rolls; CPAP titration 11/12/04- Dr. Humphrey Rolls    Current Outpatient Prescriptions on File Prior to Visit  Medication Sig Dispense Refill  . amLODipine-benazepril (LOTREL) 10-40 MG capsule Take 1 capsule by mouth every evening. 90 capsule 3  . aspirin EC 81 MG tablet Take 1 tablet (81 mg total) by mouth daily. 90 tablet 3  . Boswellia-Glucosamine-Vit D (GLUCOSAMINE COMPLEX PO) Take 1,500 mg by mouth every  morning.     Marland Kitchen co-enzyme Q-10 30 MG capsule Take 30 mg by mouth 3 (three) times daily.    Marland Kitchen ezetimibe (ZETIA) 10 MG tablet Take 1 tablet (10 mg total) by mouth daily. 90 tablet 3  . hydrochlorothiazide (HYDRODIURIL) 25 MG tablet Take 1 tablet (25 mg total) by mouth every morning. 90 tablet 3  . metoprolol succinate (TOPROL-XL) 100 MG 24 hr tablet Take 1 tablet (100 mg total) by mouth every evening. Take with or immediately following a meal. 90 tablet 3  . Multiple Vitamin (MULITIVITAMIN WITH MINERALS) TABS Take 1 tablet by mouth every evening.     Clayton [name] 3-6-9 Fatty Acids (OMEGA-3-6-9 PO) Take 1 capsule by mouth every morning. Omega 3= 800mg ,omega 6=380mg ,omega 9 =300mg     . spironolactone (ALDACTONE) 25 MG tablet Take 0.5 tablets (12.5 mg total) by mouth daily. 45 tablet 3  . vitamin C (ASCORBIC ACID) 500 MG tablet Take 500 mg by mouth every morning.     . zinc gluconate 50 MG tablet Take 50 mg by mouth every evening.     Marland Kitchen Zn-Pyg Afri-Nettle-Saw Palmet (SAW PALMETTO COMPLEX PO) Take 1 capsule by mouth every morning.      No current facility-administered medications on file prior to visit.     Allergies  Allergen Reactions  . Statins     Hyperlipidemia:  Patient tolerating Zetia therapy  and doing well with lifestyle modifications with 17 lbs weight loss so far. Patient exercise an average of 1 hour every day as well.  Currently not a candidate for PCSK9 inhibitors but may be a good candidate for Clinical Trials.  Patient preference if to wait until f/u Lipid panel completed to assess impact of current therapy.  Information about PCSK9 and general information about available trails provided to him during this visit.  He will be interested on the trials if LDL remains above goal during F/U visit with Dr Claiborne Billings on March/08/2016. Will follow with pharmacist clinic as needed.   Omega Slager Rodriguez-Guzman PharmD, [place]  [id number] 02/26/2016 9:54 AM

## 2016-02-25 ENCOUNTER — Ambulatory Visit (INDEPENDENT_AMBULATORY_CARE_PROVIDER_SITE_OTHER): Payer: Medicare Other | Admitting: Pharmacist

## 2016-02-25 DIAGNOSIS — E782 Mixed hyperlipidemia: Secondary | ICD-10-CM

## 2016-02-25 NOTE — Patient Instructions (Addendum)
1. Continue Zetia 10 mg  2. Continue lifestyle modification    Cholesterol Cholesterol is a fat. Your body needs a small amount of cholesterol. Cholesterol (plaque) may build up in your blood vessels (arteries). That makes you more likely to have a heart attack or stroke. You cannot feel your cholesterol level. Having a blood test is the only way to find out if your level is high. Keep your test results. Work with your doctor to keep your cholesterol at a good level. What do the results mean?  Total cholesterol is how much cholesterol is in your blood.  LDL is bad cholesterol. This is the type that can build up. Try to have low LDL.  HDL is good cholesterol. It cleans your blood vessels and carries LDL away. Try to have high HDL.  Triglycerides are fat that the body can store or burn for energy. What are good levels of cholesterol?  Total cholesterol below 200.  LDL below 100 is good for people who have health risks. LDL below 70 is good for people who have very high risks.  HDL above 40 is good. It is best to have HDL of 60 or higher.  Triglycerides below 150. How can I lower my cholesterol? Diet  Follow your diet program as told by your doctor.  Choose fish, white meat chicken, or [place] that is roasted or baked. Try not to eat red meat, fried foods, sausage, or lunch meats.  Eat lots of fresh fruits and vegetables.  Choose whole grains, beans, pasta, potatoes, and cereals.  Choose olive oil, corn oil, or canola oil. Only use small amounts.  Try not to eat butter, mayonnaise, shortening, or palm kernel oils.  Try not to eat foods with trans fats.  Choose low-fat or nonfat dairy foods.  Drink skim or nonfat milk.  Eat low-fat or nonfat yogurt and cheeses.  Try not to drink whole milk or cream.  Try not to eat ice cream, egg yolks, or full-fat cheeses.  Healthy desserts include angel food cake, ginger snaps, animal crackers, hard candy, popsicles, and low-fat or  nonfat frozen yogurt. Try not to eat pastries, cakes, pies, and cookies. Exercise  Follow your exercise program as told by your doctor.  Be more active. Try gardening, walking, and taking the stairs.  Ask your doctor about ways that you can be more active. Medicine  Take over-the-counter and prescription medicines only as told by your doctor. This information is not intended to replace advice given to you by your health care provider. Make sure you discuss any questions you have with your health care provider. Document Released: 04/02/2008 Document Revised: 08/06/2015 Document Reviewed: 07/17/2015 Elsevier Interactive Patient Education  2017 [employer].

## 2016-02-25 NOTE — Progress Notes (Signed)
[name] [name], [place] [address] Phone: [phone] Subjective:    I'm seeing this patient by the request  of:  Howard Pouch, DO    CC: Rib and back pain  [id number]  Clayton [name] is a 71 y.o. male coming in with complaint of rib and back pain. Patient states that this is been going on for approximately 9 months. Patient states it seemed to get acutely worse after seen a chiropractor. States that the pain now seems to be more of an intermittent worse with laying on his back or placing pressure over the area. States that it seems to be on the left side of his sternum anterior than posterior. Hasn't tried anything and was hoping it would get better with time. No radiation to the arm, no numbness or tingling. Patient did see a cardiologist. Workup was unremarkable. Patient's on primary care provider and was encouraged to take anti-inflammatories. Patient states now it seems to be more of a discomfort than a true pain. Does seem to be localized. Worse when he tries to extend his head back or his chest.     Past Medical History:  Diagnosis Date  . Arthritis    hips   . Atrial fibrillation ([place])   . Cataract   . Dysrhythmia    PAF- hx of 7 years ago   . Hypertension 05/29/10   ECHO-EF 67% wnl  . Meniere disease   . Sleep apnea 09/14/04   [facility]- Dr. Humphrey Rolls; CPAP titration 11/12/04- Dr. Humphrey Rolls   Past Surgical History:  Procedure Laterality Date  . CARDIAC CATHETERIZATION  11/03/05  . CATARACT EXTRACTION  2014  . KNEE ARTHROSCOPY     left knee- 2002   . TOTAL HIP ARTHROPLASTY  06/08/2011   Procedure: TOTAL HIP ARTHROPLASTY ANTERIOR APPROACH;  Surgeon: [name], MD;  Location: [facility];  Service: Orthopedics;  Laterality: Left;   Social History   Social History  . Marital status: Married    Spouse name: N/A  . Number of children: N/A  . Years of education: N/A   Social History Main Topics  . Smoking  status: Never Smoker  . Smokeless tobacco: Never Used  . Alcohol use No  . Drug use: No  . Sexual activity: Yes    Birth control/ protection: None   Other Topics Concern  . None   Social History Narrative   Married. [name].Takes care of his mother-in-law as well.    Retired, Conservator, museum/gallery.    Drinks caffeine beverages. Takes a daily vitamin.   Exercises routinely.    Smoke detector in the home.    Ambulates independently.       Allergies  Allergen Reactions  . Statins    Family History  Problem Relation Age of Onset  . Hypertension Mother   . Stroke Mother   . Diabetes Mother   . Diabetes Sister   . Stroke Maternal Grandmother     48 died   . Heart disease Maternal Grandfather   . Hypertension Maternal Grandfather   . Diabetes Maternal Grandfather   . Arthritis Father   . Heart disease Paternal Grandfather     Past medical history, social, surgical and family history all reviewed in electronic medical record.  No pertanent information unless stated regarding to the chief complaint.   Review of Systems:Review of systems updated and as accurate as of 02/26/16  No headache, visual changes, nausea, vomiting, diarrhea,  constipation, dizziness, abdominal pain, skin rash, fevers, chills, night sweats, weight loss, swollen lymph nodes, body aches, joint swelling, shortness of breath, mood changes.   Objective  Blood pressure 140/84, pulse (!) 108, height 6\' 3"  (1.905 m), weight 270 lb (122.5 kg), SpO2 100 %. Systems examined below as of 02/26/16   General: No apparent distress alert and oriented x3 mood and affect normal, dressed appropriately.  HEENT: Pupils equal, extraocular movements intact  Respiratory: Patient's speak in full sentences and does not appear short of breath  Cardiovascular: No lower extremity edema, non tender, no erythema  Skin: Warm dry intact with no signs of infection or rash on extremities or on axial skeleton.  Abdomen: Soft nontender    Neuro: Cranial nerves II through XII are intact, neurovascularly intact in all extremities with 2+ DTRs and 2+ pulses.  Lymph: No lymphadenopathy of posterior or anterior cervical chain or axillae bilaterally.  Gait normal with good balance and coordination.  MSK:  Non tender with full range of motion and good stability and symmetric strength and tone of shoulders, elbows, wrist, hip, knee and ankles bilaterally.  Chest exam shows TTP localized over the costochondral juncture over the seventh rib. Back Exam:  Inspection: Unremarkable  Motion: Flexion 45 deg, Extension 5 deg, Side Bending to 35 deg bilaterally,  Rotation to 35 deg bilaterally  SLR laying: Negative  XSLR laying: Negative  Palpable tenderness: Tender to palpation. Spinal musculature. Mostly in the lumbar region. FABER: Tightness bilaterally. Sensory change: Gross sensation intact to all lumbar and sacral dermatomes.  Reflexes: 2+ at both patellar tendons, 2+ at achilles tendons, Babinski's downgoing.  Strength at foot  Plantar-flexion: 5/5 Dorsi-flexion: 5/5 Eversion: 5/5 Inversion: 5/5  Leg strength  Quad: 5/5 Hamstring: 5/5 Hip flexor: 5/5 Hip abductors: 4/5 but symmetric Gait unremarkable.  Limited musculoskeletal ultrasound was performed and interpreted by [name]  Limited ultrasound shows the patient does have what appears to be a cortical defect over the seventh rib right near the costochondral juncture. Callus formation noted but seems to be soft callus. No true bony formation noted otherwise. Impression: Nonhealing anterior rib fracture.    Impression and Recommendations:     This case required medical decision making of moderate complexity.      Note: This dictation was prepared with Dragon dictation along with smaller phrase technology. Any transcriptional errors that result from this process are unintentional.

## 2016-02-26 ENCOUNTER — Encounter: Payer: Self-pay | Admitting: Pharmacist

## 2016-02-26 ENCOUNTER — Ambulatory Visit (INDEPENDENT_AMBULATORY_CARE_PROVIDER_SITE_OTHER): Payer: Medicare Other | Admitting: Family Medicine

## 2016-02-26 ENCOUNTER — Encounter: Payer: Self-pay | Admitting: Family Medicine

## 2016-02-26 ENCOUNTER — Ambulatory Visit: Payer: Self-pay

## 2016-02-26 VITALS — BP 140/84 | HR 108 | Ht 75.0 in | Wt 270.0 lb

## 2016-02-26 DIAGNOSIS — R0789 Other chest pain: Secondary | ICD-10-CM | POA: Diagnosis not present

## 2016-02-26 DIAGNOSIS — S2232XG Fracture of one rib, left side, subsequent encounter for fracture with delayed healing: Secondary | ICD-10-CM | POA: Diagnosis not present

## 2016-02-26 MED ORDER — VITAMIN D (ERGOCALCIFEROL) 1.25 MG (50000 UNIT) PO CAPS: 50000.0000 [IU] | ORAL_CAPSULE | ORAL | 0 refills | Status: DC

## 2016-02-26 NOTE — Patient Instructions (Addendum)
Good to see you.  Ice 20 minutes 2 times daily. Usually after activity and before bed. pennsaid pinkie amount topically 2 times daily as needed.  Once weekly vitamin D for 12 weeks.  Once the callus is full we can start manipulation  See em again in 4 weeks.  Keep working on the weight you are doing great!!!!

## 2016-02-26 NOTE — Assessment & Plan Note (Signed)
Patient does have what appears to be more of a delayed rib fracture. Started on vitamin D. Discussed which activities to avoid. Topical anti-inflammatories given. We discussed icing regimen. Patient will come back and see me again in 4 weeks. At that time we will discuss trying manipulation for his other chronic aches and pains.

## 2016-03-04 ENCOUNTER — Ambulatory Visit: Payer: Medicare Other | Admitting: Cardiovascular Disease

## 2016-03-25 ENCOUNTER — Encounter: Payer: Self-pay | Admitting: *Deleted

## 2016-03-25 ENCOUNTER — Ambulatory Visit: Payer: Medicare Other | Admitting: Cardiovascular Disease

## 2016-03-25 NOTE — Progress Notes (Signed)
[name] [place],  [address] Phone: [phone] Subjective:    I'm seeing this patient by the request  of:  Howard Pouch, DO    CC: Rib and back pain f/u   [id number]  Clayton [name] is a 71 y.o. male coming in with complaint of rib and back pain. Patient was found to have a nonhealing rib fracture. Seem to have delayed healing. Started on once weekly vitamin D, icing regimen and home exercises. Patient states Doing significantly better. States that the pain is 70% better. Not having the severe pain at all. Still some discomfort but no pain really. Denies any swelling or any new symptoms.     Past Medical History:  Diagnosis Date  . Arthritis    hips   . Atrial fibrillation ([place])   . Cataract   . Dysrhythmia    PAF- hx of 7 years ago   . Hypertension 05/29/10   ECHO-EF 67% wnl  . Meniere disease   . Sleep apnea 09/14/04   [facility]- Dr. Humphrey Rolls; CPAP titration 11/12/04- Dr. Humphrey Rolls   Past Surgical History:  Procedure Laterality Date  . CARDIAC CATHETERIZATION  11/03/05  . CATARACT EXTRACTION  2014  . KNEE ARTHROSCOPY     left knee- 2002   . TOTAL HIP ARTHROPLASTY  06/08/2011   Procedure: TOTAL HIP ARTHROPLASTY ANTERIOR APPROACH;  Surgeon: [name], MD;  Location: [facility];  Service: Orthopedics;  Laterality: Left;   Social History   Social History  . Marital status: Married    Spouse name: N/A  . Number of children: N/A  . Years of education: N/A   Social History Main Topics  . Smoking status: Never Smoker  . Smokeless tobacco: Never Used  . Alcohol use No  . Drug use: No  . Sexual activity: Yes    Birth control/ protection: None   Other Topics Concern  . None   Social History Narrative   Married. [name].Takes care of his mother-in-law as well.    Retired, Conservator, museum/gallery.    Drinks caffeine beverages. Takes a daily vitamin.   Exercises routinely.    Smoke detector in the  home.    Ambulates independently.       Allergies  Allergen Reactions  . Statins    Family History  Problem Relation Age of Onset  . Hypertension Mother   . Stroke Mother   . Diabetes Mother   . Diabetes Sister   . Stroke Maternal Grandmother     5 died   . Heart disease Maternal Grandfather   . Hypertension Maternal Grandfather   . Diabetes Maternal Grandfather   . Arthritis Father   . Heart disease Paternal Grandfather     Past medical history, social, surgical and family history all reviewed in electronic medical record.  No pertanent information unless stated regarding to the chief complaint.   Review of Systems: No headache, visual changes, nausea, vomiting, diarrhea, constipation, dizziness, abdominal pain, skin rash, fevers, chills, night sweats, weight loss, swollen lymph nodes, body aches, joint swelling, muscle aches, chest pain, shortness of breath, mood changes.    Objective  Blood pressure 138/84, pulse 70, height 6\' 3"  (1.905 m), weight 264 lb (119.7 kg), SpO2 96 %. Systems examined below as of 03/26/16   Systems examined below as of 03/26/16 General: NAD A&O x3 mood, affect normal  HEENT: Pupils equal, extraocular movements intact no nystagmus Respiratory: not short of breath  at rest or with speaking Cardiovascular: No lower extremity edema, non tender Skin: Warm dry intact with no signs of infection or rash on extremities or on axial skeleton. Abdomen: Soft nontender, no masses Neuro: Cranial nerves  intact, neurovascularly intact in all extremities with 2+ DTRs and 2+ pulses. Lymph: No lymphadenopathy appreciated today  Gait normal with good balance and coordination.  MSK: Non tender with full range of motion and good stability and symmetric strength and tone of shoulders, elbows, wrist,  knee hips and ankles bilaterally.   Chest exam shows nontender on exam today. Patient states that it seems to be comfortable in the area of the seventh rib. The sternal  costal juncture.. Back Exam:  Inspection: Loss of lordosis noted Motion: Flexion 45 deg, Extension 5 deg, Side Bending to 35 deg bilaterally,  Rotation to 35 deg bilaterally  SLR laying: Negative  XSLR laying: Negative  Palpable tenderness: Tender to palpation. Spinal musculature. Mostly in the lumbar region. FABER: Tightness bilaterally. Unable to do Sensory change: Gross sensation intact to all lumbar and sacral dermatomes.  Reflexes: 2+ at both patellar tendons, 2+ at achilles tendons, Babinski's downgoing.  Strength at foot  Plantar-flexion: 5/5 Dorsi-flexion: 5/5 Eversion: 5/5 Inversion: 5/5  Leg strength  Quad: 5/5 Hamstring: 5/5 Hip flexor: 5/5 Hip abductors: 4/5 but symmetric Gait unremarkable.     Impression and Recommendations:     This case required medical decision making of moderate complexity.      Note: This dictation was prepared with Dragon dictation along with smaller phrase technology. Any transcriptional errors that result from this process are unintentional.

## 2016-03-26 ENCOUNTER — Encounter: Payer: Self-pay | Admitting: Family Medicine

## 2016-03-26 ENCOUNTER — Ambulatory Visit (INDEPENDENT_AMBULATORY_CARE_PROVIDER_SITE_OTHER): Payer: Medicare Other | Admitting: Family Medicine

## 2016-03-26 DIAGNOSIS — M545 Low back pain, unspecified: Secondary | ICD-10-CM | POA: Insufficient documentation

## 2016-03-26 DIAGNOSIS — S2232XG Fracture of one rib, left side, subsequent encounter for fracture with delayed healing: Secondary | ICD-10-CM | POA: Diagnosis not present

## 2016-03-26 DIAGNOSIS — G8929 Other chronic pain: Secondary | ICD-10-CM

## 2016-03-26 NOTE — Assessment & Plan Note (Signed)
We will at this time. Encourage patient to continue same treatment. Follow-up in 4 weeks to make sure completely resolved

## 2016-03-26 NOTE — Assessment & Plan Note (Signed)
Patient likely has some degenerative disc disease. Discussed with patient at great length. I do not want to start manipular relation until patient starts feeling completely unremarkable and the chest area. Patient will follow-up again in 3-4 weeks for further evaluation.

## 2016-03-26 NOTE — Patient Instructions (Addendum)
Good to see you  Ice when you need it Continue the vitamin D  I will see you again in 4 weeks and we will work on your back at that time. I promise.

## 2016-04-14 LAB — COMPREHENSIVE METABOLIC PANEL
ALK PHOS: 63 U/L (ref 40–115)
ALT: 26 U/L (ref 9–46)
AST: 24 U/L (ref 10–35)
Albumin: 4.3 g/dL (ref 3.6–5.1)
BUN: 29 mg/dL — AB (ref 7–25)
CO2: 24 mmol/L (ref 20–31)
Calcium: 9.5 mg/dL (ref 8.6–10.3)
Chloride: 103 mmol/L (ref 98–110)
Creat: 1.44 mg/dL — ABNORMAL HIGH (ref 0.70–1.25)
GLUCOSE: 104 mg/dL — AB (ref 65–99)
POTASSIUM: 4.5 mmol/L (ref 3.5–5.3)
Sodium: 138 mmol/L (ref 135–146)
Total Bilirubin: 0.9 mg/dL (ref 0.2–1.2)
Total Protein: 6.9 g/dL (ref 6.1–8.1)

## 2016-04-14 LAB — LIPID PANEL
Cholesterol: 172 mg/dL (ref <200)
HDL: 47 mg/dL (ref >40)
LDL CALC: 108 mg/dL — AB (ref <100)
Total CHOL/HDL Ratio: 3.7 Ratio (ref <5.0)
Triglycerides: 85 mg/dL (ref <150)
VLDL: 17 mg/dL (ref <30)

## 2016-04-15 LAB — HEMOGLOBIN A1C
HEMOGLOBIN A1C: 5.4 % (ref <5.7)
MEAN PLASMA GLUCOSE: 108 mg/dL

## 2016-04-19 ENCOUNTER — Ambulatory Visit (INDEPENDENT_AMBULATORY_CARE_PROVIDER_SITE_OTHER): Payer: Medicare Other | Admitting: Cardiovascular Disease

## 2016-04-19 ENCOUNTER — Encounter: Payer: Self-pay | Admitting: Cardiovascular Disease

## 2016-04-19 VITALS — BP 154/82 | HR 68 | Ht 75.0 in | Wt 257.8 lb

## 2016-04-19 DIAGNOSIS — G4733 Obstructive sleep apnea (adult) (pediatric): Secondary | ICD-10-CM | POA: Diagnosis not present

## 2016-04-19 DIAGNOSIS — E669 Obesity, unspecified: Secondary | ICD-10-CM

## 2016-04-19 DIAGNOSIS — Z79899 Other long term (current) drug therapy: Secondary | ICD-10-CM

## 2016-04-19 DIAGNOSIS — E784 Other hyperlipidemia: Secondary | ICD-10-CM | POA: Diagnosis not present

## 2016-04-19 DIAGNOSIS — I10 Essential (primary) hypertension: Secondary | ICD-10-CM | POA: Diagnosis not present

## 2016-04-19 DIAGNOSIS — Z9989 Dependence on other enabling machines and devices: Secondary | ICD-10-CM | POA: Diagnosis not present

## 2016-04-19 DIAGNOSIS — E7849 Other hyperlipidemia: Secondary | ICD-10-CM

## 2016-04-19 DIAGNOSIS — I480 Paroxysmal atrial fibrillation: Secondary | ICD-10-CM | POA: Diagnosis not present

## 2016-04-19 MED ORDER — PITAVASTATIN CALCIUM 1 MG PO TABS: 1.0000 mg | ORAL_TABLET | Freq: Every day | ORAL | 5 refills | Status: DC

## 2016-04-19 NOTE — Progress Notes (Signed)
Patient ID: Clayton [name], male   DOB: Nov 11, 1945, 71 y.o.   MRN: [id number]     HPI: Clayton [name] is a 71 y.o. male who presents for a 15 month follow-up cardiology followup evaluation.  Clayton [name] has a history of hypertension, a remote history of PAF, status post cardioversion in 2007, obstructive sleep apnea for which he uses CPAP 100% of the time, hypertension, and hyperlipidemia. He has had weight fluctuations over the years.  In the past, he also had a history of Mnire's disease.  There was some concern by [name] that perhaps this may have been related to statin use, which led to its discontinuance.  He is not had Mnire's disease in some time.  He underwent left hip replacement surgery by Dr. [name] and now has been able to continue to be active and exercises routinely.   His  blood pressure regimen has been amlodipine/benazepril 10/40 daily, hydrochlorothiazide has been taking 25 Mill grams in the evening and Toprol-XL 100 mg daily. He had atrial of livalo  For hyperlipidemia but developed some diarrhea secondary to this.    He has not had much success with weight loss over the past several years.    He continues to use his CPAP therapy with 100% compliance.  He will not even take a nap without his CPAP therapy and he takes his CPAP unit on alll his travels.   Since initiating CPAP therapy, he is unaware of any palpitations.  He denies any recurrent atrial fibrillation.  In the past he has had difficulty with family stress and his father had passed away, and his wife's mother recently died after having developed lung CA and had significant PVD.  He was not exercising as much as he had in the past.  He has not been successful in weight loss.  He continues to be active and still walks 3-4 miles per day.  Oftentimes in the morning when he awakens his blood pressure is elevated at 150/90 but after exercise it drops to 128/80.  He is unaware of any recurrent atrial fibrillation.  He  continues to use CPAP with 100% compliance.   Since I last saw him, he completed the two-year grieving process.  Since 01/19/2016.  He is committed to weight loss.  On January 1, he weighed 278 pounds and as of this morning at home.  He weight 256 pounds.  He states his blood pressure has been much better and at home typically is in the 1:30 to 134 range.  Yesterday after walking 2-1/2 miles.  His blood pressure was 114/73.  He continues to use CPAP with 100% compliance and is a huge supportive therapy.  Since he's been on CPAP, he has not had any recurrent atrial fibrillation and his blood pressure has been less labile.  He has had failures to Crestor and Lipitor in the past due to significant myalgias, development, even with weekly dosing.  When I last saw him, I initiated Zetia 10 mg since his LDL had risen to 143.  He has tolerated this.  Arch 28 2018.  Repeat lipid studies showed improvement in his total cholesterol from 211 down to 172 and his LDL from 143, down to 108.  Triglycerides have improved from 123, down to 85.  He feels well.  He is committed to lose another 20 pounds over the next 3 months.  He presents for evaluation.  He presents for evaluation.  Past Medical History:  Diagnosis Date  . Arthritis  hips   . Atrial fibrillation ([place])   . Cataract   . Dysrhythmia    PAF- hx of 7 years ago   . Hypertension 05/29/10   ECHO-EF 67% wnl  . Meniere disease   . Sleep apnea 09/14/04   [facility]- Dr. Humphrey Rolls; CPAP titration 11/12/04- Dr. Humphrey Rolls    Past Surgical History:  Procedure Laterality Date  . CARDIAC CATHETERIZATION  11/03/05  . CATARACT EXTRACTION  2014  . KNEE ARTHROSCOPY     left knee- 2002   . TOTAL HIP ARTHROPLASTY  06/08/2011   Procedure: TOTAL HIP ARTHROPLASTY ANTERIOR APPROACH;  Surgeon: [name], MD;  Location: [facility];  Service: Orthopedics;  Laterality: Left;    Allergies  Allergen Reactions  . Statins     Current Outpatient Prescriptions    Medication Sig Dispense Refill  . amLODipine-benazepril (LOTREL) 10-40 MG capsule Take 1 capsule by mouth every evening. 90 capsule 3  . aspirin EC 81 MG tablet Take 1 tablet (81 mg total) by mouth daily. 90 tablet 3  . [place]-Glucosamine-Vit D (GLUCOSAMINE COMPLEX PO) Take 1,500 mg by mouth every morning.     Marland Kitchen co-enzyme Q-10 30 MG capsule Take 200 mg by mouth daily.     . hydrochlorothiazide (HYDRODIURIL) 25 MG tablet Take 1 tablet (25 mg total) by mouth every morning. 90 tablet 3  . metoprolol succinate (TOPROL-XL) 100 MG 24 hr tablet Take 1 tablet (100 mg total) by mouth every evening. Take with or immediately following a meal. 90 tablet 3  . Multiple Vitamin (MULITIVITAMIN WITH MINERALS) TABS Take 1 tablet by mouth every evening.     [name] 3-6-9 Fatty Acids (OMEGA-3-6-9 PO) Take 1 capsule by mouth every morning. Omega 3= 847m,omega 6=3888momega 9 =30048m  . spironolactone (ALDACTONE) 25 MG tablet Take 0.5 tablets (12.5 mg total) by mouth daily. 45 tablet 3  . vitamin C (ASCORBIC ACID) 500 MG tablet Take 500 mg by mouth every morning.     . zinc gluconate 50 MG tablet Take 50 mg by mouth every evening.     . ZMarland Kitchen-Pyg Afri-Nettle-Saw Palmet (SAW PALMETTO COMPLEX PO) Take 1 capsule by mouth every morning.     . ezetimibe (ZETIA) 10 MG tablet Take 1 tablet (10 mg total) by mouth daily. 90 tablet 3  . Pitavastatin Calcium 1 MG TABS Take 1 tablet (1 mg total) by mouth daily. 30 tablet 5   No current facility-administered medications for this visit.     Socially he is remarried for 8 years per his first wife passed away. 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  He exercises daily and walks 3 miles per day.  ROS General: Negative; No fevers, chills, or night sweats;  HEENT: Negative; No changes in vision or hearing, sinus congestion, difficulty swallowing Pulmonary: Negative; No cough, wheezing, shortness  of breath, hemoptysis Cardiovascular: Negative; No chest pain, presyncope, syncope, palpitations GI: Negative; No nausea, vomiting, diarrhea, or abdominal pain GU: Negative; No dysuria, hematuria, or difficulty voiding Musculoskeletal: Negative; no myalgias, joint pain, or weakness Hematologic/Oncology: Negative; no easy bruising, bleeding Endocrine: Negative; no heat/cold intolerance; no diabetes Neuro: No vertigo or imbalance issues Skin: Negative; No rashes or skin lesions Psychiatric: Negative; No behavioral problems, depression Sleep: Positive for obstructive sleep apnea on CPAP therapy with 100% compliance No snoring, daytime sleepiness, hypersomnolence, bruxism, restless legs, hypnogognic hallucinations, no cataplexy Other comprehensive 14  point system review is negative.  PE BP (!) 154/82   Pulse 68   Ht _0  (1.905 m)   Wt 257 lb 12.8 oz (116.9 kg)   BMI 32.22 kg/m    Repeat blood pressure by me was 134/82  Wt Readings from Last 3 Encounters:  04/19/16 257 lb 12.8 oz (116.9 kg)  03/26/16 264 lb (119.7 kg)  02/26/16 270 lb (122.5 kg)   General: Alert, oriented, no distress.  Skin: normal turgor, no rashes HEENT: Normocephalic, atraumatic. Pupils round and reactive; sclera anicteric;no lid lag.  Nose without nasal septal hypertrophy Mouth/Parynx benign; Mallinpatti scale 3 Neck: Thick neck; No JVD, no carotid bruits with normal carotid upstroke Lungs: clear to ausculatation and percussion; no wheezing or rales Chest wall: Nontender to palpation  Heart: RRR, s1 s2 normal; 1/6 systolic murmur, no diastolic murmur.  No rubs thrills or heaves.  No S3 or S4 gallop Abdomen: Moderate central adiposity;soft, nontender; no hepatosplenomehaly, BS+; abdominal aorta nontender and not dilated by palpation. Back: No CVA tenderness Pulses 2+ Extremities: no clubbing cyanosis or edema, Homan's sign negative  Neurologic: grossly nonfocal Psychological: Normal affect and mood  ECG  (independently read by me): Normal sinus rhythm at 68 bpm.  LVH with repolarization.  Normal intervals.  November 2017 ECG (independently read by me): Normal sinus rhythm at 78 bpm.  Nonspecific interventricular delay.  QRS complex V1 V2.  August 2016 ECG (independently read by me): Normal sinus rhythm at 77 bpm.  LVH with repolarization changes.  Left axis deviation.  December 2015 ECG (independently read by me): Normal sinus rhythm at 87 bpm.  LVH by voltage criteria with T-wave changes in leads 1 and aVL.  July 2014 ECG: Normal sinus rhythm at 76 beats per minute. LVH with QRS widening.  LABS:  BMP Latest Ref Rng & Units 04/14/2016 12/08/2015 08/27/2014  Glucose 65 - 99 mg/dL 104(H) 112(H) 104(H)  BUN 7 - 25 mg/dL 29(H) 21 23  Creatinine 0.70 - 1.25 mg/dL 1.44(H) 1.06 1.22  Sodium 135 - 146 mmol/L 138 139 142  Potassium 3.5 - 5.3 mmol/L 4.5 3.8 3.9  Chloride 98 - 110 mmol/L 103 103 103  CO2 20 - 31 mmol/L _1 Calcium 8.6 - 10.3 mg/dL 9.5 9.4 9.1   Hepatic Function Latest Ref Rng & Units 04/14/2016 12/08/2015 08/27/2014  Total Protein 6.1 - 8.1 g/dL 6.9 6.8 6.6  Albumin 3.6 - 5.1 g/dL 4.3 4.3 4.3  AST 10 - 35 U/L _2 ALT 9 - 46 U/L _3 Alk Phosphatase 40 - 115 U/L 63 51 60  Total Bilirubin 0.2 - 1.2 mg/dL 0.9 1.0 0.9   CBC Latest Ref Rng & Units 12/08/2015 08/27/2014 12/17/2013  WBC 3.8 - 10.8 K/uL 5.4 5.0 5.1  Hemoglobin 13.2 - 17.1 g/dL 15.2 15.3 15.7  Hematocrit 38.5 - 50.0 % 44.1 44.1 45.3  Platelets 140 - 400 K/uL 194 199 209   Lab Results  Component Value Date   MCV 90.6 12/08/2015   MCV 88.4 08/27/2014   MCV 87.8 12/17/2013   Lab Results  Component Value Date   TSH 1.28 12/08/2015    Lab Results  Component Value Date   HGBA1C 5.4 04/14/2016    Lipid Panel     Component Value Date/Time   CHOL 172 04/14/2016 0937   TRIG 85 04/14/2016 0937   HDL 47 04/14/2016 0937   CHOLHDL 3.7 04/14/2016 0937   VLDL 17 04/14/2016 0932  LDLCALC 108 (H)  04/14/2016 3374      RADIOLOGY: No results found.  IMPRESSION:  1. Essential hypertension   2. Other hyperlipidemia   3. Encounter for long-term (current) use of medications   4. OSA on CPAP   5. Mild obesity   6. PAF (paroxysmal atrial fibrillation) ([place])     ASSESSMENT AND PLAN: Clayton [name] is a 71 year old WM who has a long-standing history of hypertension and has been on amlodipine/benazepril 10/40, HCTZ 25 mg, Toprol-XL 100 mg and is now taking hydrochlorothiazide in the morning, which has improved his prior nocturia.  When I last saw him, his blood pressure was increased and I initiated spironolactone 12.5 mg.  Over the past several months, he feels his blood pressure is markedly improved.  His  Weight had peaked at  278 pounds and since January 1 he has lost approximately 22 pounds.  He is exercising daily.  He has reduced sweets and carbohydrates.  His lipid studies are improved but still elevated.  I reviewed with him recent data regarding very low LDL levels in inducing plaque regression.  Clinical trials have consistently shown plaque progression and his current LDL level, even though this is improved with his Zetia.  He has failed Crestor and Lipitor in the past.  I will provide him with samples of livalo, to see if he is able to tolerate will initiate  at 1 mg every other day and after 1-2 weeks if he feels well 1 mg daily.  He will continue Zetia 10 mg.  I suggested coenzyme Q10 300 mg daily. If he is unable to tolerate this statin, I had a lengthy discussion with him regarding PCSK 9 inhibition , both with reference to plaque regression as well as MI and stroke risk reduction when compared with patients with established CAD and mean LDL levels of 92.  In 3 months repeat laboratory will be obtained and I'll see him in the office for follow-up evaluation.  His palpitations are very minimal if any.  He has not had any recurrent atrial fibrillation.  He continues to use CPAP with 100%  compliance.  Time spent: 25 minutes  [name], MD, [facility]  04/19/2016 2:23 PM

## 2016-04-19 NOTE — Patient Instructions (Signed)
Medication Instructions: start livalo 1mg  every other day for 1-2 weeks. If tolerated take daily.   Labwork: CMET, lipid (fasting) before next office visit.   Testing/Procedures:   Follow-Up: with Dr. Claiborne Billings in 3 months   If you need a refill on your cardiac medications before your next appointment, please call your pharmacy.

## 2016-04-23 ENCOUNTER — Encounter: Payer: Self-pay | Admitting: Family Medicine

## 2016-04-23 ENCOUNTER — Ambulatory Visit (INDEPENDENT_AMBULATORY_CARE_PROVIDER_SITE_OTHER): Payer: Medicare Other | Admitting: Family Medicine

## 2016-04-23 DIAGNOSIS — M545 Low back pain: Secondary | ICD-10-CM | POA: Diagnosis not present

## 2016-04-23 DIAGNOSIS — G8929 Other chronic pain: Secondary | ICD-10-CM

## 2016-04-23 DIAGNOSIS — M9984 Other biomechanical lesions of sacral region: Secondary | ICD-10-CM

## 2016-04-23 DIAGNOSIS — M9983 Other biomechanical lesions of lumbar region: Secondary | ICD-10-CM

## 2016-04-23 DIAGNOSIS — M999 Biomechanical lesion, unspecified: Secondary | ICD-10-CM | POA: Insufficient documentation

## 2016-04-23 NOTE — Assessment & Plan Note (Signed)
Decision today to treat with OMT was based on Physical Exam  After verbal consent patient was treated with HVLA, ME, FPR techniques in cervical, thoracic, lumbar and sacral areas  Patient tolerated the procedure well with improvement in symptoms  Patient given exercises, stretches and lifestyle modifications  See medications in patient instructions if given  Patient will follow up in 4-6 weeks 

## 2016-04-23 NOTE — Patient Instructions (Signed)
Great to see you  Ice when you need it Clayton [name] the once weekly vitamin D then go to 2000 IU daily  Exercises on wall.  Heel and butt touching.  Raise leg 6 inches and hold 2 seconds.  Down slow for count of 4 seconds.  1 set of 30 reps daily on both sides.  See me again in 4-6 weeks.

## 2016-04-23 NOTE — Progress Notes (Signed)
Pre visit review using our clinic review tool, if applicable. No additional management support is needed unless otherwise documented below in the visit note. 

## 2016-04-23 NOTE — Assessment & Plan Note (Signed)
Patient does have history of degenerative disc disease. Patient though does very well. Encourage him to continue with core strengthening given hip abductor strengthening. Attempted osteopathic manipulation with good results. Patient continues with the vitamin D supplementation. Patient will come back and see me again in 4-6 weeks

## 2016-04-23 NOTE — Progress Notes (Signed)
[name] [address] [place], [place] [address] Phone: [phone] Subjective:    I'm seeing this patient by the request  of:  Howard Pouch, DO    CC: Rib and back pain f/u   [id number]  Clayton [name] is a 71 y.o. male coming in with complaint of rib and back pain. No pain at all. Patient is having some mild back pain. Has been playing golf a lot more frequently. Doing the exercises fairly regularly. Patient states that some mild discomfort in the back. No radiation down the legs. Try to stay active.     Past Medical History:  Diagnosis Date  . Arthritis    hips   . Atrial fibrillation ([place])   . Cataract   . Dysrhythmia    PAF- hx of 7 years ago   . Hypertension 05/29/10   ECHO-EF 67% wnl  . Meniere disease   . Sleep apnea 09/14/04   [facility]- Dr. Humphrey Rolls; CPAP titration 11/12/04- Dr. Humphrey Rolls   Past Surgical History:  Procedure Laterality Date  . CARDIAC CATHETERIZATION  11/03/05  . CATARACT EXTRACTION  2014  . KNEE ARTHROSCOPY     left knee- 2002   . TOTAL HIP ARTHROPLASTY  06/08/2011   Procedure: TOTAL HIP ARTHROPLASTY ANTERIOR APPROACH;  Surgeon: [name], MD;  Location: [facility];  Service: Orthopedics;  Laterality: Left;   Social History   Social History  . Marital status: Married    Spouse name: N/A  . Number of children: N/A  . Years of education: N/A   Social History Main Topics  . Smoking status: Never Smoker  . Smokeless tobacco: Never Used  . Alcohol use No  . Drug use: No  . Sexual activity: Yes    Birth control/ protection: None   Other Topics Concern  . None   Social History Narrative   Married. [name].Takes care of his mother-in-law as well.    Retired, Conservator, museum/gallery.    Drinks caffeine beverages. Takes a daily vitamin.   Exercises routinely.    Smoke detector in the home.    Ambulates independently.       Allergies  Allergen Reactions  . Statins    Family History    Problem Relation Age of Onset  . Hypertension Mother   . Stroke Mother   . Diabetes Mother   . Diabetes Sister   . Stroke Maternal Grandmother     21 died   . Heart disease Maternal Grandfather   . Hypertension Maternal Grandfather   . Diabetes Maternal Grandfather   . Arthritis Father   . Heart disease Paternal Grandfather     Past medical history, social, surgical and family history all reviewed in electronic medical record.  No pertanent information unless stated regarding to the chief complaint.   Review of Systems: No headache, visual changes, nausea, vomiting, diarrhea, constipation, dizziness, abdominal pain, skin rash, fevers, chills, night sweats, weight loss, swollen lymph nodes, body aches, joint swelling, muscle aches, chest pain, shortness of breath, mood changes.    Objective  Blood pressure 140/72, pulse (!) 59, height 6\' 3"  (1.905 m), weight 260 lb (117.9 kg), SpO2 97 %.   Systems examined below as of 04/23/16 General: NAD A&O x3 mood, affect normal  HEENT: Pupils equal, extraocular movements intact no nystagmus Respiratory: not short of breath at rest or with speaking Cardiovascular: No lower extremity edema, non tender Skin: Warm dry intact with no signs of  infection or rash on extremities or on axial skeleton. Abdomen: Soft nontender, no masses Neuro: Cranial nerves  intact, neurovascularly intact in all extremities with 2+ DTRs and 2+ pulses. Lymph: No lymphadenopathy appreciated today  Gait normal with good balance and coordination.  MSK: Non tender with full range of motion and good stability and symmetric strength and tone of shoulders, elbows, wrist,  knee hips and ankles bilaterally.  Mild arthritic changes of multiple joints joint replacement of the knees and hips noted Back Exam:  Inspection: Continued loss of lordosis Motion: Flexion 45 deg, Extension 15 deg, Side Bending to 35 deg bilaterally,  Rotation to 35 deg bilaterally  SLR laying: Negative   XSLR laying: Negative  Palpable tenderness: Mild discomfort in the paraspinal musculature of the lumbar spine FABER: Tightness bilaterally still noted Sensory change: Gross sensation intact to all lumbar and sacral dermatomes.  Reflexes: 2+ at both patellar tendons, 2+ at achilles tendons, Babinski's downgoing.  Strength at foot  Plantar-flexion: 5/5 Dorsi-flexion: 5/5 Eversion: 5/5 Inversion: 5/5  Leg strength  Quad: 5/5 Hamstring: 5/5 Hip flexor: 5/5 Hip abductors: 4/5 but symmetric Gait unremarkable.  Osteopathic findings Cervical C2 flexed rotated and side bent right C4 flexed rotated and side bent left T3 extended rotated and side bent right inhaled third rib T8 extended rotated and side bent left L3 flexed rotated and side bent right Sacrum right on right     Impression and Recommendations:     This case required medical decision making of moderate complexity.      Note: This dictation was prepared with Dragon dictation along with smaller phrase technology. Any transcriptional errors that result from this process are unintentional.

## 2016-05-20 NOTE — Progress Notes (Signed)
[name] [address] [place], [place] [address] Phone: [phone] Subjective:    I'm seeing this patient by the request  of:  Howard Pouch, DO    CC: Rib and back pain f/u   [id number]  Clayton [name] is a 71 y.o. male coming in with complaint of rib and back pain. No pain at all. Patient is having some mild back pain. Has been playing golf a lot more frequently. Doing the exercises fairly regularly. Patient states that some mild discomfort in the back. No radiation down the legs. Patient did respond fairly well to manipulation. Patient is starting to increase his activity. Felt that for one week did not have any sequelae pain whatsoever. Patient has been some more active doing things outside as well as playing more golf. Continues to lose weight. Having some mild cramping at night but otherwise doing fairly well.     Past Medical History:  Diagnosis Date  . Arthritis    hips   . Atrial fibrillation ([place])   . Cataract   . Dysrhythmia    PAF- hx of 7 years ago   . Hypertension 05/29/10   ECHO-EF 67% wnl  . Meniere disease   . Sleep apnea 09/14/04   [facility]- Dr. Humphrey Rolls; CPAP titration 11/12/04- Dr. Humphrey Rolls   Past Surgical History:  Procedure Laterality Date  . CARDIAC CATHETERIZATION  11/03/05  . CATARACT EXTRACTION  2014  . KNEE ARTHROSCOPY     left knee- 2002   . TOTAL HIP ARTHROPLASTY  06/08/2011   Procedure: TOTAL HIP ARTHROPLASTY ANTERIOR APPROACH;  Surgeon: [name], MD;  Location: [facility];  Service: Orthopedics;  Laterality: Left;   Social History   Social History  . Marital status: Married    Spouse name: N/A  . Number of children: N/A  . Years of education: N/A   Social History Main Topics  . Smoking status: Never Smoker  . Smokeless tobacco: Never Used  . Alcohol use No  . Drug use: No  . Sexual activity: Yes    Birth control/ protection: None   Other Topics Concern  . None   Social History  Narrative   Married. [name].Takes care of his mother-in-law as well.    Retired, Conservator, museum/gallery.    Drinks caffeine beverages. Takes a daily vitamin.   Exercises routinely.    Smoke detector in the home.    Ambulates independently.       Allergies  Allergen Reactions  . Statins    Family History  Problem Relation Age of Onset  . Hypertension Mother   . Stroke Mother   . Diabetes Mother   . Diabetes Sister   . Stroke Maternal Grandmother     56 died   . Heart disease Maternal Grandfather   . Hypertension Maternal Grandfather   . Diabetes Maternal Grandfather   . Arthritis Father   . Heart disease Paternal Grandfather     Past medical history, social, surgical and family history all reviewed in electronic medical record.  No pertanent information unless stated regarding to the chief complaint.   Review of Systems: No headache, visual changes, nausea, vomiting, diarrhea, constipation, dizziness, abdominal pain, skin rash, fevers, chills, night sweats, weight loss, swollen lymph nodes, body aches, joint swelling, muscle aches, chest pain, shortness of breath, mood changes.    Objective  Blood pressure 130/80, pulse 66, resp. rate 16, weight 255 lb (115.7 kg), SpO2 98 %.  Systems examined below as of 05/21/16 General: NAD A&O x3 mood, affect normal  HEENT: Pupils equal, extraocular movements intact no nystagmus Respiratory: not short of breath at rest or with speaking Cardiovascular: No lower extremity edema, non tender Skin: Warm dry intact with no signs of infection or rash on extremities or on axial skeleton. Abdomen: Soft nontender, no masses Neuro: Cranial nerves  intact, neurovascularly intact in all extremities with 2+ DTRs and 2+ pulses. Lymph: No lymphadenopathy appreciated today  Gait normal with good balance and coordination.  MSK: Non tender with full range of motion and good stability and symmetric strength and tone of shoulders, elbows, wrist,  knee hips  and ankles bilaterally.  Mild arthritic changes of multiple joints joint replacement of the knees and hips noted Back Exam:  Inspection: Continued loss of lordosis Motion: Flexion 40 deg, Extension 15 deg, Side Bending to 35 deg bilaterally,  Rotation to 30 deg bilaterally  SLR laying: Negative  XSLR laying: Negative  Palpable tenderness: Continued mild tightness and appears palmar musculature lumbar spine as well as in the thoracal lumbar junction. FABER: Tightness bilaterally still noted no significant change Sensory change: Gross sensation intact to all lumbar and sacral dermatomes.  Reflexes: 2+ at both patellar tendons, 2+ at achilles tendons, Babinski's downgoing.  Strength at foot  Plantar-flexion: 5/5 Dorsi-flexion: 5/5 Eversion: 5/5 Inversion: 5/5  Leg strength  Quad: 5/5 Hamstring: 5/5 Hip flexor: 5/5 Hip abductors: 4/5 but symmetric Gait unremarkable.  Osteopathic findings Cervical C3 flexed rotated and side bent right C7 flexed rotated and side bent left T5 extended rotated and side bent left inhaled rib T9 extended rotated and side bent left L3 flexed rotated and side bent right Sacrum right on right     Impression and Recommendations:     This case required medical decision making of moderate complexity.      Note: This dictation was prepared with Dragon dictation along with smaller phrase technology. Any transcriptional errors that result from this process are unintentional.

## 2016-05-21 ENCOUNTER — Encounter: Payer: Self-pay | Admitting: Family Medicine

## 2016-05-21 ENCOUNTER — Ambulatory Visit (INDEPENDENT_AMBULATORY_CARE_PROVIDER_SITE_OTHER): Payer: Medicare Other | Admitting: Family Medicine

## 2016-05-21 VITALS — BP 130/80 | HR 66 | Resp 16 | Wt 255.0 lb

## 2016-05-21 DIAGNOSIS — M999 Biomechanical lesion, unspecified: Secondary | ICD-10-CM

## 2016-05-21 DIAGNOSIS — M9902 Segmental and somatic dysfunction of thoracic region: Secondary | ICD-10-CM

## 2016-05-21 DIAGNOSIS — M545 Low back pain: Secondary | ICD-10-CM

## 2016-05-21 DIAGNOSIS — G8929 Other chronic pain: Secondary | ICD-10-CM

## 2016-05-21 NOTE — Assessment & Plan Note (Signed)
Patient is doing very well with conservative therapy. We discussed icing regimen and home exercises. We discussed which activities to do which ones to avoid. We discussed icing regimen and home exercises, which activities to avoid. Return again in 4 weeks

## 2016-05-21 NOTE — Assessment & Plan Note (Signed)
Decision today to treat with OMT was based on Physical Exam  After verbal consent patient was treated with HVLA, ME, FPR techniques in  thoracic, lumbar and sacral areas  Patient tolerated the procedure well with improvement in symptoms  Patient given exercises, stretches and lifestyle modifications  See medications in patient instructions if given  Patient will follow up in 4-6 weeks 

## 2016-05-21 NOTE — Patient Instructions (Signed)
Great to see you  Overall your are doing great! Shoot a 69 Iron 65mg  daily with 500mg  of vitamin C Ice when you need it.  Try planks and other isometrics fr core strength  Hold a plank for 30 seconds 2-3 times a day and try to increase up to 1 minute in the long run.  See me before your trip! (3-5 weeks)

## 2016-06-13 NOTE — Progress Notes (Signed)
[name] [place],  [address] Phone: [phone] Subjective:      CC: Back pain follow up   [id number]  Clayton [name] is a 71 y.o. male coming in with complaint of back and side pain. Patient was initially seen by me for having a rib fracture. Seem to be well-healed and started on osteopathic manipulation treatments. Patient was to start increasing home exercises including hip abductor strengthening. Patient states doing great, lots of weight loss.  Staying active, doing exercises regularly.      Past Medical History:  Diagnosis Date  . Arthritis    hips   . Atrial fibrillation ([place])   . Cataract   . Dysrhythmia    PAF- hx of 7 years ago   . Hypertension 05/29/10   ECHO-EF 67% wnl  . Meniere disease   . Sleep apnea 09/14/04   [facility]- Dr. Humphrey Rolls; CPAP titration 11/12/04- Dr. Humphrey Rolls   Past Surgical History:  Procedure Laterality Date  . CARDIAC CATHETERIZATION  11/03/05  . CATARACT EXTRACTION  2014  . KNEE ARTHROSCOPY     left knee- 2002   . TOTAL HIP ARTHROPLASTY  06/08/2011   Procedure: TOTAL HIP ARTHROPLASTY ANTERIOR APPROACH;  Surgeon: [name], MD;  Location: [facility];  Service: Orthopedics;  Laterality: Left;   Social History   Social History  . Marital status: Married    Spouse name: N/A  . Number of children: N/A  . Years of education: N/A   Social History Main Topics  . Smoking status: Never Smoker  . Smokeless tobacco: Never Used  . Alcohol use No  . Drug use: No  . Sexual activity: Yes    Birth control/ protection: None   Other Topics Concern  . None   Social History Narrative   Married. [name].Takes care of his mother-in-law as well.    Retired, Conservator, museum/gallery.    Drinks caffeine beverages. Takes a daily vitamin.   Exercises routinely.    Smoke detector in the home.    Ambulates independently.       Allergies  Allergen Reactions  . Statins    Family  History  Problem Relation Age of Onset  . Hypertension Mother   . Stroke Mother   . Diabetes Mother   . Diabetes Sister   . Stroke Maternal Grandmother        11 died   . Heart disease Maternal Grandfather   . Hypertension Maternal Grandfather   . Diabetes Maternal Grandfather   . Arthritis Father   . Heart disease Paternal Grandfather     Past medical history, social, surgical and family history all reviewed in electronic medical record.  No pertanent information unless stated regarding to the chief complaint.   Review of Systems: No headache, visual changes, nausea, vomiting, diarrhea, constipation, dizziness, abdominal pain, skin rash, fevers, chills, night sweats, weight loss, swollen lymph nodes, body aches, joint swelling, muscle aches, chest pain, shortness of breath, mood changes.    Objective  Blood pressure 138/82, pulse 67, height 6\' 3"  (1.905 m), weight 254 lb (115.2 kg), SpO2 97 %. Systems examined below as of 06/15/16   General: No apparent distress alert and oriented x3 mood and affect normal, dressed appropriately.  HEENT: Pupils equal, extraocular movements intact  Respiratory: Patient's speak in full sentences and does not appear short of breath  Cardiovascular: No lower extremity edema, non tender, no erythema  Skin: Warm dry intact  with no signs of infection or rash on extremities or on axial skeleton.  Abdomen: Soft nontender  Neuro: Cranial nerves II through XII are intact, neurovascularly intact in all extremities with 2+ DTRs and 2+ pulses.  Lymph: No lymphadenopathy of posterior or anterior cervical chain or axillae bilaterally.  Gait normal with good balance and coordination.  MSK:  Non tender with full range of motion and good stability and symmetric strength and tone of shoulders, elbows, wrist, hip, knee and ankles bilaterally.  Back Exam:  Inspection: Unremarkable  Motion: Flexion 40 deg, Extension 25 deg, Side Bending to 45 deg bilaterally,    Rotation to 45 deg bilaterally  SLR laying: Negative   XSLR laying: Negative  Palpable tenderness: Mild discomfort . FABER: negative. Sensory change: Gross sensation intact to all lumbar and sacral dermatomes.  Reflexes: 2+ at both patellar tendons, 2+ at achilles tendons, Babinski's downgoing.  Strength at foot  Plantar-flexion: 5/5 Dorsi-flexion: 5/5 Eversion: 5/5 Inversion: 5/5  Leg strength  Quad: 5/5 Hamstring: 5/5 Hip flexor: 5/5 Hip abductors: 5/5  Gait unremarkable.  Osteopathic findings C2 flexed rotated and side bent right C4 flexed rotated and side bent left C5 flexed rotated and side bent left T3 extended rotated and side bent right inhaled third rib T9 extended rotated and side bent left L2 flexed rotated and side bent right Sacrum right on right    Impression and Recommendations:     This case required medical decision making of moderate complexity.      Note: This dictation was prepared with Dragon dictation along with smaller phrase technology. Any transcriptional errors that result from this process are unintentional.

## 2016-06-15 ENCOUNTER — Encounter: Payer: Self-pay | Admitting: Family Medicine

## 2016-06-15 ENCOUNTER — Ambulatory Visit (INDEPENDENT_AMBULATORY_CARE_PROVIDER_SITE_OTHER): Payer: Medicare Other | Admitting: Family Medicine

## 2016-06-15 VITALS — BP 138/82 | HR 67 | Ht 75.0 in | Wt 254.0 lb

## 2016-06-15 DIAGNOSIS — M9902 Segmental and somatic dysfunction of thoracic region: Secondary | ICD-10-CM | POA: Diagnosis not present

## 2016-06-15 DIAGNOSIS — M545 Low back pain: Secondary | ICD-10-CM | POA: Diagnosis not present

## 2016-06-15 DIAGNOSIS — G8929 Other chronic pain: Secondary | ICD-10-CM | POA: Diagnosis not present

## 2016-06-15 DIAGNOSIS — M999 Biomechanical lesion, unspecified: Secondary | ICD-10-CM

## 2016-06-15 NOTE — Assessment & Plan Note (Signed)
Decision today to treat with OMT was based on Physical Exam  After verbal consent patient was treated with HVLA, ME, FPR techniques in cervical, thoracic, lumbar and sacral areas  Patient tolerated the procedure well with improvement in symptoms  Patient given exercises, stretches and lifestyle modifications  See medications in patient instructions if given  Patient will follow up in 4 weeks 

## 2016-06-15 NOTE — Patient Instructions (Signed)
I am so impressed  Keep it up  Have a great time with the grand kids Try to still stretch a bit See me again in 4 weeks!

## 2016-06-15 NOTE — Assessment & Plan Note (Signed)
Doing better overall. Responds well to OMT.  No changes will be traveling, given HEP to do while traveling  RTC in 4 weeks.

## 2016-07-19 ENCOUNTER — Ambulatory Visit: Payer: Medicare Other | Admitting: Family Medicine

## 2016-07-26 ENCOUNTER — Encounter: Payer: Self-pay | Admitting: Family Medicine

## 2016-07-26 ENCOUNTER — Ambulatory Visit (INDEPENDENT_AMBULATORY_CARE_PROVIDER_SITE_OTHER): Payer: Medicare Other | Admitting: Family Medicine

## 2016-07-26 ENCOUNTER — Ambulatory Visit: Payer: Medicare Other | Admitting: Cardiovascular Disease

## 2016-07-26 VITALS — BP 132/80 | HR 64 | Ht 75.0 in | Wt 257.0 lb

## 2016-07-26 DIAGNOSIS — G8929 Other chronic pain: Secondary | ICD-10-CM | POA: Diagnosis not present

## 2016-07-26 DIAGNOSIS — M545 Low back pain: Secondary | ICD-10-CM | POA: Diagnosis not present

## 2016-07-26 DIAGNOSIS — M999 Biomechanical lesion, unspecified: Secondary | ICD-10-CM

## 2016-07-26 NOTE — Progress Notes (Signed)
[name] [address] [place], [place] [address] Phone: [phone] Subjective:      CC: Back pain follow up   [id number]  Clayton [name] is a 71 y.o. male coming in with complaint of back and side pain. Patient was initially seen by me for having a rib fracture.Patient healed from this. Patient has been seen for osteopathic manipulation help with the chronic back pain. Patient for the last 8 weeks has been traveling. States that overall seems to be doing relatively well but has not been doing exercises as regularly. Patient denies any new symptoms is worsening of previous symptoms. Mild increase in stiffness.     Past Medical History:  Diagnosis Date  . Arthritis    hips   . Atrial fibrillation ([place])   . Cataract   . Dysrhythmia    PAF- hx of 7 years ago   . Hypertension 05/29/10   ECHO-EF 67% wnl  . Meniere disease   . Sleep apnea 09/14/04   [facility]- Dr. Humphrey Rolls; CPAP titration 11/12/04- Dr. Humphrey Rolls   Past Surgical History:  Procedure Laterality Date  . CARDIAC CATHETERIZATION  11/03/05  . CATARACT EXTRACTION  2014  . KNEE ARTHROSCOPY     left knee- 2002   . TOTAL HIP ARTHROPLASTY  06/08/2011   Procedure: TOTAL HIP ARTHROPLASTY ANTERIOR APPROACH;  Surgeon: [name], MD;  Location: [facility];  Service: Orthopedics;  Laterality: Left;   Social History   Social History  . Marital status: Married    Spouse name: N/A  . Number of children: N/A  . Years of education: N/A   Social History Main Topics  . Smoking status: Never Smoker  . Smokeless tobacco: Never Used  . Alcohol use No  . Drug use: No  . Sexual activity: Yes    Birth control/ protection: None   Other Topics Concern  . None   Social History Narrative   Married. [name].Takes care of his mother-in-law as well.    Retired, Conservator, museum/gallery.    Drinks caffeine beverages. Takes a daily vitamin.   Exercises routinely.    Smoke detector in the  home.    Ambulates independently.       Allergies  Allergen Reactions  . Statins    Family History  Problem Relation Age of Onset  . Hypertension Mother   . Stroke Mother   . Diabetes Mother   . Diabetes Sister   . Stroke Maternal Grandmother        68 died   . Heart disease Maternal Grandfather   . Hypertension Maternal Grandfather   . Diabetes Maternal Grandfather   . Arthritis Father   . Heart disease Paternal Grandfather     Past medical history, social, surgical and family history all reviewed in electronic medical record.  No pertanent information unless stated regarding to the chief complaint.   Review of Systems: No headache, visual changes, nausea, vomiting, diarrhea, constipation, dizziness, abdominal pain, skin rash, fevers, chills, night sweats, weight loss, swollen lymph nodes, body aches, joint swelling, muscle aches, chest pain, shortness of breath, mood changes.    Objective  Blood pressure 132/80, pulse 64, height 6\' 3"  (1.905 m), weight 257 lb (116.6 kg), SpO2 97 %. Systems examined below as of 07/26/16   Systems examined below as of 07/26/16 General: NAD A&O x3 mood, affect normal  HEENT: Pupils equal, extraocular movements intact no nystagmus Respiratory: not short of breath at rest or  with speaking Cardiovascular: No lower extremity edema, non tender Skin: Warm dry intact with no signs of infection or rash on extremities or on axial skeleton. Abdomen: Soft nontender, no masses Neuro: Cranial nerves  intact, neurovascularly intact in all extremities with 2+ DTRs and 2+ pulses. Lymph: No lymphadenopathy appreciated today  Gait normal with good balance and coordination.  MSK: Non tender with full range of motion and good stability and symmetric strength and tone of shoulders, elbows, wrist,  knee hips and ankles bilaterally.  Mild arthritic changes  Back Exam:  Inspection: Mild loss of lordosis Motion: Flexion 45 deg, Extension 25 deg, Side Bending to 45  deg bilaterally,  Rotation to 45 deg bilaterally  SLR laying: Negative  XSLR laying: Negative  Palpable tenderness: Still mild tenderness to palpation mostly over the lumbosacral area and somewhat over the thoracic lumbar area. Seems to be symmetric and bilateral.. FABER: negative. Sensory change: Gross sensation intact to all lumbar and sacral dermatomes.  Reflexes: 2+ at both patellar tendons, 2+ at achilles tendons, Babinski's downgoing.  Strength at foot  Plantar-flexion: 5/5 Dorsi-flexion: 5/5 Eversion: 5/5 Inversion: 5/5  Leg strength  Quad: 5/5 Hamstring: 5/5 Hip flexor: 5/5 Hip abductors: 5/5  Gait unremarkable.  Osteopathic findings C2 flexed rotated and side bent right T3 extended rotated and side bent right inhaled third rib T9 extended rotated and side bent left L2 flexed rotated and side bent right Sacrum right on right     Impression and Recommendations:     This case required medical decision making of moderate complexity.      Note: This dictation was prepared with Dragon dictation along with smaller phrase technology. Any transcriptional errors that result from this process are unintentional.

## 2016-07-26 NOTE — Assessment & Plan Note (Signed)
Stable at the moment. We discussed icing regimen and home exercises. We discussed core stability. We discussed objective is a doing which ones to avoid. Patient has done well enough that we will space out to 6-8 week intervals.

## 2016-07-26 NOTE — Assessment & Plan Note (Signed)
Decision today to treat with OMT was based on Physical Exam  After verbal consent patient was treated with HVLA, ME, FPR techniques in  thoracic, lumbar and sacral areas  Patient tolerated the procedure well with improvement in symptoms  Patient given exercises, stretches and lifestyle modifications  See medications in patient instructions if given  Patient will follow up in  6-8 weeks 

## 2016-07-26 NOTE — Patient Instructions (Signed)
Good to see you  You are doing great  Get back on that routine.  Ice when you need it See me again in 6-8 weeks

## 2016-08-04 ENCOUNTER — Encounter: Payer: Self-pay | Admitting: *Deleted

## 2016-08-04 ENCOUNTER — Telehealth: Payer: Self-pay | Admitting: *Deleted

## 2016-08-04 NOTE — Telephone Encounter (Signed)
Sent message to patient in MY Chart to call and schedule CPE and Medicare Wellness.

## 2016-08-25 ENCOUNTER — Ambulatory Visit (INDEPENDENT_AMBULATORY_CARE_PROVIDER_SITE_OTHER): Payer: Medicare Other | Admitting: Physician Assistant

## 2016-08-25 ENCOUNTER — Telehealth: Payer: Self-pay | Admitting: Cardiovascular Disease

## 2016-08-25 ENCOUNTER — Encounter: Payer: Self-pay | Admitting: Physician Assistant

## 2016-08-25 VITALS — BP 140/88 | HR 82 | Ht 75.0 in | Wt 249.0 lb

## 2016-08-25 DIAGNOSIS — I480 Paroxysmal atrial fibrillation: Secondary | ICD-10-CM | POA: Diagnosis not present

## 2016-08-25 DIAGNOSIS — I10 Essential (primary) hypertension: Secondary | ICD-10-CM

## 2016-08-25 NOTE — Progress Notes (Signed)
Cardiology Office Note   Date:  08/27/2016   ID:  Clayton [name], DOB 1945-11-19, MRN [id number]  PCP:  Ma Hillock, DO  Cardiologist:  Dr. Claiborne Billings, 04/19/2016  Clayton Ferries, PA-C   Chief Complaint  Patient presents with  . Atrial Fibrillation    "Quivering" feeling    History of Present Illness: Clayton [name] is a 71 y.o. male with a history of PAF & DCCV 2007, OSA on CPAP, OA, HTN, HLD, hx Meniere's dz>>d/c statin  Clayton [name] presents for cardiology follow up  He has been with Dr Claiborne Billings for 12 years. He had DCCV years ago, but is no longer on a blood thinner.   He is happy with his general health, has lost 33 lbs in the last 8 months. He is finding it easier to increase his activity.  He has no CP w/ exertion. He denies DOE, exercises 5 days a week.   Normally, he will get palpitations w/ heart skipping briefly about every 2 months. They do not cause any additional symptoms.  Yesterday, he had an episode of palpitations that lasted longer than usual (but still <10 sec). He had no CP/SOB/presyncope with it. I concerned him because it lasted longer. This am, his BP was up, unusual for him. He is concerned about this. Currently asymptomatic.   Past Medical History:  Diagnosis Date  . Arthritis    hips   . Atrial fibrillation ([place])   . Cataract   . Dysrhythmia    PAF- hx of 7 years ago   . Hypertension 05/29/10   ECHO-EF 67% wnl  . Meniere disease   . [name] apnea 09/14/04   [facility]ayton [name]- Dr. Humphrey Rolls; CPAP titration 11/12/04- Dr. Humphrey Rolls    Past Surgical History:  Procedure Laterality Date  . CARDIAC CATHETERIZATION  11/03/05  . CATARACT EXTRACTION  2014  . KNEE ARTHROSCOPY     left knee- 2002   . TOTAL HIP ARTHROPLASTY  06/08/2011   Procedure: TOTAL HIP ARTHROPLASTY ANTERIOR APPROACH;  Surgeon: Clayton Pole, MD;  Location: [facility];  Service: Orthopedics;  Laterality: Left;    Current Outpatient Prescriptions  Medication Sig Dispense  Refill  . amLODipine-benazepril (LOTREL) 10-40 MG capsule Take 1 capsule by mouth every evening. 90 capsule 3  . aspirin EC 81 MG tablet Take 1 tablet (81 mg total) by mouth daily. 90 tablet 3  . [place]-Glucosamine-Vit D (GLUCOSAMINE COMPLEX PO) Take 1,500 mg by mouth every morning.     Marland Kitchen co-enzyme Q-10 30 MG capsule Take 200 mg by mouth daily.     Marland Kitchen ezetimibe (ZETIA) 10 MG tablet Take 1 tablet (10 mg total) by mouth daily. 90 tablet 3  . hydrochlorothiazide (HYDRODIURIL) 25 MG tablet Take 1 tablet (25 mg total) by mouth every morning. 90 tablet 3  . metoprolol succinate (TOPROL-XL) 100 MG 24 hr tablet Take 1 tablet (100 mg total) by mouth every evening. Take with or immediately following a meal. 90 tablet 3  . Multiple Vitamin (MULITIVITAMIN WITH MINERALS) TABS Take 1 tablet by mouth every evening.     [name] 3-6-9 Fatty Acids (OMEGA-3-6-9 PO) Take 1 capsule by mouth every morning. Omega 3= 800mg ,omega 6=380mg ,omega 9 =300mg     . Pitavastatin Calcium 1 MG TABS Take 1 tablet (1 mg total) by mouth daily. 30 tablet 5  . spironolactone (ALDACTONE) 25 MG tablet Take 0.5 tablets (12.5 mg total) by mouth daily. 45 tablet 3  . vitamin C (ASCORBIC  ACID) 500 MG tablet Take 500 mg by mouth every morning.     . zinc gluconate 50 MG tablet Take 50 mg by mouth every evening.     Marland Kitchen Zn-Pyg Afri-Nettle-Saw Palmet (SAW PALMETTO COMPLEX PO) Take 1 capsule by mouth every morning.      No current facility-administered medications for this visit.     Allergies:   Statins    Social History:  The patient  reports that he has never smoked. He has never used smokeless tobacco. He reports that he does not drink alcohol or use drugs.   Family History:  The patient's family history includes Arthritis in his father; Diabetes in his maternal grandfather, mother, and sister; Heart disease in his maternal grandfather and paternal grandfather; Hypertension in his maternal grandfather and mother; Stroke in his maternal  grandmother and mother.    ROS:  Please see the history of present illness. All other systems are reviewed and negative.    PHYSICAL EXAM: VS:  BP 140/88   Pulse 82   Ht 6\' 3"  (1.905 m)   Wt 249 lb (112.9 kg)   BMI 31.12 kg/m  , BMI Body mass index is 31.12 kg/m. GEN: Well nourished, well developed, male in no acute distress  HEENT: normal for age  Neck: no JVD, no carotid bruit, no masses Cardiac: RRR; soft murmur, no rubs, or gallops Respiratory:  clear to auscultation bilaterally, normal work of breathing GI: soft, nontender, nondistended, + BS MS: no deformity or atrophy; no edema; distal pulses are 2+ in all 4 extremities   Skin: warm and dry, no rash Neuro:  Strength and sensation are intact Psych: euthymic mood, full affect   EKG:  EKG is ordered today. The ekg ordered today demonstrates SR, LVH w/ QRS duration 128 ms, meets criteria for LBBB. No sig change   Recent Labs: 12/08/2015: Hemoglobin 15.2; Platelets 194; TSH 1.28 04/14/2016: ALT 26; BUN 29; Creat 1.44; Potassium 4.5; Sodium 138    Lipid Panel    Component Value Date/Time   CHOL 172 04/14/2016 0937   TRIG 85 04/14/2016 0937   HDL 47 04/14/2016 0937   CHOLHDL 3.7 04/14/2016 0937   VLDL 17 04/14/2016 0937   LDLCALC 108 (H) 04/14/2016 0937     Wt Readings from Last 3 Encounters:  08/25/16 249 lb (112.9 kg)  07/26/16 257 lb (116.6 kg)  06/15/16 254 lb (115.2 kg)     Other studies Reviewed: Additional studies/ records that were reviewed today include: office notes and testing.  ASSESSMENT AND PLAN:  1.  PAF hx: unclear what the palpitations are. If he is having afib, needs anticoagulation. CHADS2VASC=2 (age x 1, HTN). Offered him an event monitor but he is reluctant. Will add BB.  Since sx may not happen that often, think ILR is best option. Discuss with Dr Claiborne Billings   2. HTN: BP is up today, pt states has white-coat syndrome, but BP has also been up at other times. Add BB.   Current medicines  are reviewed at length with the patient today.  The patient does not have concerns regarding medicines.  The following changes have been made:  Add BB  Labs/ tests ordered today include:   Orders Placed This Encounter  Procedures  . EKG 12-Lead     Disposition:   FU with Dr. Claiborne Billings  Signed, Clayton Ferries, PA-C  08/27/2016 11:21 AM    [place] Phone: [phone]; Fax: [phone]  This note was written  with the assistance of speech recognition software. Please excuse any transcriptional errors.

## 2016-08-25 NOTE — Telephone Encounter (Signed)
Spoke with patient regarding heart rate. Patient stated he had history of Afib over 10 years ago. Yesterday he had an episode of where he felt irregular beat. Patient does have a slight feeling of "quivering" in chest area. His blood pressure monitoring has been reading irregular beat today and concerned. Patient scheduled to see Donnie Aho PA today at 2:30

## 2016-08-25 NOTE — Telephone Encounter (Signed)
New Message  Pt call requesting to speak with RN about having an irregular heart beat. Pt states his bp is fine but he would like to speak with RN. Please call back to discuss

## 2016-08-26 MED ORDER — METOPROLOL SUCCINATE ER 50 MG PO TB24: 50.0000 mg | ORAL_TABLET | ORAL | 5 refills | Status: DC

## 2016-08-26 NOTE — Patient Instructions (Signed)
Medication Instructions:  METOPROLOL XL 50MG  DAILY-MAY START AT 1/2 TAB IF NEEDED  If you need a refill on your cardiac medications before your next appointment, please call your pharmacy.  Follow-Up: Your physician wants you to follow-up: September 27TH WITH DR KELLY AS SCHEDULED, PER TELEPHONE CONVERSATION.   Special Instructions: WILL CALL ABOUT LOOP RECORDER  Thank you for choosing [facility] at [facility]!!

## 2016-09-06 ENCOUNTER — Ambulatory Visit (INDEPENDENT_AMBULATORY_CARE_PROVIDER_SITE_OTHER): Payer: Medicare Other | Admitting: Family Medicine

## 2016-09-06 ENCOUNTER — Encounter: Payer: Self-pay | Admitting: Family Medicine

## 2016-09-06 VITALS — BP 118/80 | HR 72 | Wt 243.0 lb

## 2016-09-06 DIAGNOSIS — M9902 Segmental and somatic dysfunction of thoracic region: Secondary | ICD-10-CM | POA: Diagnosis not present

## 2016-09-06 DIAGNOSIS — M545 Low back pain: Secondary | ICD-10-CM | POA: Diagnosis not present

## 2016-09-06 DIAGNOSIS — M999 Biomechanical lesion, unspecified: Secondary | ICD-10-CM | POA: Diagnosis not present

## 2016-09-06 DIAGNOSIS — G8929 Other chronic pain: Secondary | ICD-10-CM | POA: Diagnosis not present

## 2016-09-06 NOTE — Assessment & Plan Note (Signed)
Patient did have low back pain. We discussed with patient at great length. Patient is doing significantly better overall. Responding very well to a supine manipulation. No significant changes in management. Follow-up again in 6 weeks

## 2016-09-06 NOTE — Patient Instructions (Signed)
Good to see you  You are amazing in many ways. Probably all due to your better half.  See me again in 6 weeks.

## 2016-09-06 NOTE — Assessment & Plan Note (Signed)
Decision today to treat with OMT was based on Physical Exam  After verbal consent patient was treated with HVLA, ME, FPR techniques in cervical, thoracic, lumbar and sacral areas  Patient tolerated the procedure well with improvement in symptoms  Patient given exercises, stretches and lifestyle modifications  See medications in patient instructions if given  Patient will follow up in 6 weeks 

## 2016-09-06 NOTE — Progress Notes (Signed)
[name] [address] [place], [place] [address] Phone: [phone] Subjective:      CC: Back pain follow up   [id number]  Clayton [name] is a 71 y.o. male coming in with complaint of back and side pain. Patient was initially seen by me for having a rib fracture. Was healed. Has been responding fairly well to osteopathic manipulation. In doing home exercises and continuing to try to lose weight. Patient states has been doing well Patient denies having worsening low back pain at this moment. Seems to be very intermittent. Unable to continue to work out and is continuing to lose weight.    Past Medical History:  Diagnosis Date  . Arthritis    hips   . Atrial fibrillation ([place])   . Cataract   . Dysrhythmia    PAF- hx of 7 years ago   . Hypertension 05/29/10   ECHO-EF 67% wnl  . Meniere disease   . Sleep apnea 09/14/04   [facility]- Dr. Humphrey Rolls; CPAP titration 11/12/04- Dr. Humphrey Rolls   Past Surgical History:  Procedure Laterality Date  . CARDIAC CATHETERIZATION  11/03/05  . CATARACT EXTRACTION  2014  . KNEE ARTHROSCOPY     left knee- 2002   . TOTAL HIP ARTHROPLASTY  06/08/2011   Procedure: TOTAL HIP ARTHROPLASTY ANTERIOR APPROACH;  Surgeon: [name], MD;  Location: [facility];  Service: Orthopedics;  Laterality: Left;   Social History   Social History  . Marital status: Married    Spouse name: N/A  . Number of children: N/A  . Years of education: N/A   Social History Main Topics  . Smoking status: Never Smoker  . Smokeless tobacco: Never Used  . Alcohol use No  . Drug use: No  . Sexual activity: Yes    Birth control/ protection: None   Other Topics Concern  . None   Social History Narrative   Married. [name].Takes care of his mother-in-law as well.    Retired, Conservator, museum/gallery.    Drinks caffeine beverages. Takes a daily vitamin.   Exercises routinely.    Smoke detector in the home.    Ambulates  independently.       Allergies  Allergen Reactions  . Statins    Family History  Problem Relation Age of Onset  . Hypertension Mother   . Stroke Mother   . Diabetes Mother   . Diabetes Sister   . Stroke Maternal Grandmother        39 died   . Heart disease Maternal Grandfather   . Hypertension Maternal Grandfather   . Diabetes Maternal Grandfather   . Arthritis Father   . Heart disease Paternal Grandfather     Past medical history, social, surgical and family history all reviewed in electronic medical record.  No pertanent information unless stated regarding to the chief complaint.   Review of Systems: No headache, visual changes, nausea, vomiting, diarrhea, constipation, dizziness, abdominal pain, skin rash, fevers, chills, night sweats, weight loss, swollen lymph nodes, body aches, joint swelling, chest pain, shortness of breath, mood changes. Mild positive muscle aches   Objective  Blood pressure 118/80, pulse 72, weight 243 lb (110.2 kg).   Systems examined below as of 09/06/16 General: NAD A&O x3 mood, affect normal  HEENT: Pupils equal, extraocular movements intact no nystagmus Respiratory: not short of breath at rest or with speaking Cardiovascular: No lower extremity edema, non tender Skin: Warm dry intact with no signs  of infection or rash on extremities or on axial skeleton. Abdomen: Soft nontender, no masses Neuro: Cranial nerves  intact, neurovascularly intact in all extremities with 2+ DTRs and 2+ pulses. Lymph: No lymphadenopathy appreciated today  Gait normal with good balance and coordination.  MSK: Non tender with full range of motion and good stability and symmetric strength and tone of shoulders, elbows, wrist,  knee hips and ankles bilaterally.  Mild arthritic changes of multiple joints Back Exam:  Inspection: Mild loss of lordosis Motion: Flexion 45 deg, Extension 25 deg, Side Bending to 45 deg bilaterally,  Rotation to 45 deg bilaterally  SLR laying:  Negative  XSLR laying: Negative  Palpable tenderness: Tender to palpation in the paraspinal musculature.Marland Kitchen FABER: negative. Sensory change: Gross sensation intact to all lumbar and sacral dermatomes.  Reflexes: 2+ at both patellar tendons, 2+ at achilles tendons, Babinski's downgoing.  Strength at foot  Plantar-flexion: 5/5 Dorsi-flexion: 5/5 Eversion: 5/5 Inversion: 5/5  Leg strength  Quad: 5/5 Hamstring: 5/5 Hip flexor: 5/5 Hip abductors: 5/5  Gait unremarkable.   Osteopathic findings C2 flexed rotated and side bent right C4 flexed rotated and side bent left C7 flexed rotated and side bent left T3 extended rotated and side bent right inhaled third rib T9 extended rotated and side bent left L2 flexed rotated and side bent right Sacrum right on right      Impression and Recommendations:     This case required medical decision making of moderate complexity.      Note: This dictation was prepared with Dragon dictation along with smaller phrase technology. Any transcriptional errors that result from this process are unintentional.

## 2016-10-12 LAB — COMPLETE METABOLIC PANEL WITH GFR
AG Ratio: 1.9 (calc) (ref 1.0–2.5)
ALBUMIN MSPROF: 4.4 g/dL (ref 3.6–5.1)
ALT: 23 U/L (ref 9–46)
AST: 21 U/L (ref 10–35)
Alkaline phosphatase (APISO): 52 U/L (ref 40–115)
BUN / CREAT RATIO: 18 (calc) (ref 6–22)
BUN: 24 mg/dL (ref 7–25)
CO2: 28 mmol/L (ref 20–32)
CREATININE: 1.32 mg/dL — AB (ref 0.70–1.18)
Calcium: 9.4 mg/dL (ref 8.6–10.3)
Chloride: 104 mmol/L (ref 98–110)
GFR, EST AFRICAN AMERICAN: 63 mL/min/{1.73_m2} (ref >=60)
GFR, Est Non African American: 54 mL/min/{1.73_m2} — ABNORMAL LOW (ref >=60)
GLUCOSE: 111 mg/dL — AB (ref 65–99)
Globulin: 2.3 g/dL (calc) (ref 1.9–3.7)
Potassium: 4.5 mmol/L (ref 3.5–5.3)
Sodium: 138 mmol/L (ref 135–146)
TOTAL PROTEIN: 6.7 g/dL (ref 6.1–8.1)
Total Bilirubin: 1 mg/dL (ref 0.2–1.2)

## 2016-10-12 LAB — LIPID PANEL
Cholesterol: 187 mg/dL (ref <200)
HDL: 49 mg/dL (ref >40)
LDL Cholesterol (Calc): 119 mg/dL (calc) — ABNORMAL HIGH
Non-HDL Cholesterol (Calc): 138 mg/dL (calc) — ABNORMAL HIGH (ref <130)
TRIGLYCERIDES: 93 mg/dL (ref <150)
Total CHOL/HDL Ratio: 3.8 (calc) (ref <5.0)

## 2016-10-14 ENCOUNTER — Ambulatory Visit (INDEPENDENT_AMBULATORY_CARE_PROVIDER_SITE_OTHER): Payer: Medicare Other | Admitting: Cardiovascular Disease

## 2016-10-14 ENCOUNTER — Encounter: Payer: Self-pay | Admitting: Cardiovascular Disease

## 2016-10-14 VITALS — BP 143/90 | HR 63 | Ht 75.0 in | Wt 248.8 lb

## 2016-10-14 DIAGNOSIS — Z9989 Dependence on other enabling machines and devices: Secondary | ICD-10-CM

## 2016-10-14 DIAGNOSIS — I10 Essential (primary) hypertension: Secondary | ICD-10-CM

## 2016-10-14 DIAGNOSIS — G4733 Obstructive sleep apnea (adult) (pediatric): Secondary | ICD-10-CM

## 2016-10-14 DIAGNOSIS — E669 Obesity, unspecified: Secondary | ICD-10-CM

## 2016-10-14 DIAGNOSIS — I480 Paroxysmal atrial fibrillation: Secondary | ICD-10-CM

## 2016-10-14 NOTE — Patient Instructions (Signed)

## 2016-10-14 NOTE — Progress Notes (Signed)
Patient ID: PRIEST LOCKRIDGE, male   DOB: 1945-09-17, 71 y.o.   MRN: [id number]     HPI: FENIX RORKE is a 71 y.o. male who presents for a 6 month follow-up cardiology followup evaluation.  Mr. Christen has a history of hypertension, a remote history of PAF, status post cardioversion in 2007, obstructive sleep apnea for which he uses CPAP 100% of the time, hypertension, and hyperlipidemia. He has had weight fluctuations over the years.  In the past, he also had a history of Mnire's disease.  There was some concern by [name] that perhaps this may have been related to statin use, which led to its discontinuance.  He is not had Mnire's disease in some time.  He underwent left hip replacement surgery by Dr. [name] and now has been able to continue to be active and exercises routinely.   His  blood pressure regimen has been amlodipine/benazepril 10/40 daily, hydrochlorothiazide has been taking 25 Mill grams in the evening and Toprol-XL 100 mg daily. He had atrial of livalo  For hyperlipidemia but developed some diarrhea secondary to this.    He has not had much success with weight loss over the past several years.    He continues to use his CPAP therapy with 100% compliance.  He will not even take a nap without his CPAP therapy and he takes his CPAP unit on alll his travels.   Since initiating CPAP therapy, he is unaware of any palpitations.  He denies any recurrent atrial fibrillation.  In the past he has had difficulty with family stress and his father had passed away, and his wife's mother recently died after having developed lung CA and had significant PVD.  He was not exercising as much as he had in the past.  He has not been successful in weight loss.  He continues to be active and still walks 3-4 miles per day.  Oftentimes in the morning when he awakens his blood pressure is elevated at 150/90 but after exercise it drops to 128/80.  He is unaware of any recurrent atrial fibrillation.  He  continues to use CPAP with 100% compliance.   He completed a two-year grieving process.  Since 01/19/2016 he committed to weight loss.  On January 1, he weighed 278 pounds .  He has been walking 4 miles 5 days per week as well as some intermittent stationary bike.  He has lost a total of 32 pounds since January.  He continues to use CPAP with 100% compliance and since he has been on CPAP, he has not had any recurrent atrial fibrillation and his blood pressure has been less labile.  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  He has had failures to Crestor and Lipitor in the past due to significant myalgias, development, even with weekly dosing.  I had resume Zetia, which she has been tolerating.  In August, he noticed his heart rate increasing and he presented for evaluation and was seen by [name], PAC.  He admitted to a rare palpitation.  He denied any chest pain with exertion or dyspnea on exertion.  He has suffered a broken rib.  This year.  He has a weight goal of 220 pounds.  He has significant leak changed his diet and is no longer drinking lemonade or sweets.  He presents for reevaluation.  Past Medical History:  Diagnosis Date  . Arthritis    hips   . Atrial fibrillation ([place])   .  Cataract   . Dysrhythmia    PAF- hx of 7 years ago   . Hypertension 05/29/10   ECHO-EF 67% wnl  . Meniere disease   . Sleep apnea 09/14/04   [facility]- Dr. Humphrey Rolls; CPAP titration 11/12/04- Dr. Humphrey Rolls    Past Surgical History:  Procedure Laterality Date  . CARDIAC CATHETERIZATION  11/03/05  . CATARACT EXTRACTION  2014  . KNEE ARTHROSCOPY     left knee- 2002   . TOTAL HIP ARTHROPLASTY  06/08/2011   Procedure: TOTAL HIP ARTHROPLASTY ANTERIOR APPROACH;  Surgeon: [name], MD;  Location: [facility];  Service: Orthopedics;  Laterality: Left;    Allergies  Allergen Reactions  . Statins     Current Outpatient Prescriptions  Medication Sig Dispense Refill  .  amLODipine-benazepril (LOTREL) 10-40 MG capsule Take 1 capsule by mouth every evening. 90 capsule 3  . aspirin EC 81 MG tablet Take 1 tablet (81 mg total) by mouth daily. 90 tablet 3  . [place]well[place]-Glucosamine-Vit D (GLUCOSAMINE COMPLEX PO) Take 1,500 mg by mouth every morning.     Marland Kitchen co-enzyme Q-10 30 MG capsule Take 200 mg by mouth daily.     . hydrochlorothiazide (HYDRODIURIL) 25 MG tablet Take 1 tablet (25 mg total) by mouth every morning. 90 tablet 3  . metoprolol succinate (TOPROL-XL) 50 MG 24 hr tablet Take 1 tablet (50 mg total) by mouth every morning. 30 tablet 5  . Multiple Vitamin (MULITIVITAMIN WITH MINERALS) TABS Take 1 tablet by mouth every evening.     [name] 3-6-9 Fatty Acids (OMEGA-3-6-9 PO) Take 1 capsule by mouth every morning. Omega 3= 880m,omega 6=3843momega 9 =30058m  . Pitavastatin Calcium 1 MG TABS Take 1 tablet (1 mg total) by mouth daily. 30 tablet 5  . spironolactone (ALDACTONE) 25 MG tablet Take 0.5 tablets (12.5 mg total) by mouth daily. 45 tablet 3  . vitamin C (ASCORBIC ACID) 500 MG tablet Take 500 mg by mouth every morning.     . zinc gluconate 50 MG tablet Take 50 mg by mouth every evening.     . ZMarland Kitchen-Pyg Afri-Nettle-Saw Palmet (SAW PALMETTO COMPLEX PO) Take 1 capsule by mouth every morning.     . ezetimibe (ZETIA) 10 MG tablet Take 1 tablet (10 mg total) by mouth daily. 90 tablet 3   No current facility-administered medications for this visit.     Socially he is remarried for 8 years per his first wife passed away. 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  He exercises daily and walks 3 miles per day.  ROS General: Negative; No fevers, chills, or night sweats;  HEENT: Negative; No changes in vision or hearing, sinus congestion, difficulty swallowing Pulmonary: Negative; No cough, wheezing, shortness of breath, hemoptysis Cardiovascular: Negative; No chest pain, presyncope,  syncope, palpitations GI: Negative; No nausea, vomiting, diarrhea, or abdominal pain GU: Negative; No dysuria, hematuria, or difficulty voiding Musculoskeletal: Negative; no myalgias, joint pain, or weakness Hematologic/Oncology: Negative; no easy bruising, bleeding Endocrine: Negative; no heat/cold intolerance; no diabetes Neuro: No vertigo or imbalance issues Skin: Negative; No rashes or skin lesions Psychiatric: Negative; No behavioral problems, depression Sleep: Positive for obstructive sleep apnea on CPAP therapy with 100% compliance No snoring, daytime sleepiness, hypersomnolence, bruxism, restless legs, hypnogognic hallucinations, no cataplexy Other comprehensive 14 point system review is negative.  PE BP (!) 143/90   Pulse 63   Ht 6'  3" (1.905 m)   Wt 248 lb 12.8 oz (112.9 kg)   BMI 31.10 kg/m    Repeat blood pressure by me was 148/88  Wt Readings from Last 3 Encounters:  10/14/16 248 lb 12.8 oz (112.9 kg)  09/06/16 243 lb (110.2 kg)  08/25/16 249 lb (112.9 kg)   General: Alert, oriented, no distress.  Skin: normal turgor, no rashes, warm and dry HEENT: Normocephalic, atraumatic. Pupils equal round and reactive to light; sclera anicteric; extraocular muscles intact;  Nose without nasal septal hypertrophy Mouth/Parynx benign; Mallinpatti scale 3 Neck: No JVD, no carotid bruits; normal carotid upstroke Lungs: clear to ausculatation and percussion; no wheezing or rales Chest wall: without tenderness to palpitation Heart: PMI not displaced, RRR, s1 s2 normal, 1/6 systolic murmur, no diastolic murmur, no rubs, gallops, thrills, or heaves Abdomen: soft, nontender; no hepatosplenomehaly, BS+; abdominal aorta nontender and not dilated by palpation. Back: no CVA tenderness Pulses 2+ Musculoskeletal: full range of motion, normal strength, no joint deformities Extremities: no clubbing cyanosis or edema, Homan's sign negative  Neurologic: grossly nonfocal; Cranial nerves grossly  wnl Psychologic: Normal mood and affect   ECG (independently read by me): Normal sinus rhythm at 63 bpm.  Left bundle branch block with repolarization changes.  Normal intervals.  No ectopy.  April 2018 ECG (independently read by me): Normal sinus rhythm at 68 bpm.  LVH with repolarization.  Normal intervals.  November 2017 ECG (independently read by me): Normal sinus rhythm at 78 bpm.  Nonspecific interventricular delay.  QRS complex V1 V2.  August 2016 ECG (independently read by me): Normal sinus rhythm at 77 bpm.  LVH with repolarization changes.  Left axis deviation.  December 2015 ECG (independently read by me): Normal sinus rhythm at 87 bpm.  LVH by voltage criteria with T-wave changes in leads 1 and aVL.  July 2014 ECG: Normal sinus rhythm at 76 beats per minute. LVH with QRS widening.  LABS:  BMP Latest Ref Rng & Units 10/12/2016 04/14/2016 12/08/2015  Glucose 65 - 99 mg/dL 111(H) 104(H) 112(H)  BUN 7 - 25 mg/dL 24 29(H) 21  Creatinine 0.70 - 1.18 mg/dL 1.32(H) 1.44(H) 1.06  BUN/Creat Ratio 6 - 22 (calc) 18 - -  Sodium 135 - 146 mmol/L 138 138 139  Potassium 3.5 - 5.3 mmol/L 4.5 4.5 3.8  Chloride 98 - 110 mmol/L 104 103 103  CO2 20 - 32 mmol/L _0 Calcium 8.6 - 10.3 mg/dL 9.4 9.5 9.4   Hepatic Function Latest Ref Rng & Units 10/12/2016 04/14/2016 12/08/2015  Total Protein 6.1 - 8.1 g/dL 6.7 6.9 6.8  Albumin 3.6 - 5.1 g/dL - 4.3 4.3  AST 10 - 35 U/L _1 ALT 9 - 46 U/L _2 Alk Phosphatase 40 - 115 U/L - 63 51  Total Bilirubin 0.2 - 1.2 mg/dL 1.0 0.9 1.0   CBC Latest Ref Rng & Units 12/08/2015 08/27/2014 12/17/2013  WBC 3.8 - 10.8 K/uL 5.4 5.0 5.1  Hemoglobin 13.2 - 17.1 g/dL 15.2 15.3 15.7  Hematocrit 38.5 - 50.0 % 44.1 44.1 45.3  Platelets 140 - 400 K/uL 194 199 209   Lab Results  Component Value Date   MCV 90.6 12/08/2015   MCV 88.4 08/27/2014   MCV 87.8 12/17/2013   Lab Results  Component Value Date   TSH 1.28 12/08/2015    Lab Results    Component Value Date   HGBA1C 5.4 04/14/2016    Lipid Panel  Component Value Date/Time   CHOL 187 10/12/2016 0902   TRIG 93 10/12/2016 0902   HDL 49 10/12/2016 0902   CHOLHDL 3.8 10/12/2016 0902   VLDL 17 04/14/2016 0937   LDLCALC 108 (H) 04/14/2016 4010     RADIOLOGY: No results found.  IMPRESSION:  No diagnosis found.  ASSESSMENT AND PLAN: Mr Epp is a 71 year old WM who has a long-standing history of hypertension and has been on amlodipine/benazepril 10/40, HCTZ 25 mg, Toprol-XL 100 mg and is now taking hydrochlorothiazide in the morning, which has improved his prior nocturia.  His blood pressure significantly improved with the addition of spironolactone 12.5 mg to his medical regimen.   His  Weight had peaked at  278 pounds and since January 1 he has lost approximately 32 pounds.  He is now walking 4 miles a least 5 days per week and on some days.  Also uses a stationary bike for more intense sweating.  He is awake: 220.  He also has had significant change in his diet.  He is now on combination therapy with Zetia 10 mg and was also given a prescription for Livalo 1 mg.  I discussed with him aggressive treatment, which should reduce the likelihood for subclinical atherosclerosis.  He continues to use CPAP with 100% compliance and is 100% committed to therapy and even takes naps with his CPAP.  At times he notes a rare palpitation which seem to be stress mediated.  He had been seen in the office last month with this complaint.  He is on Toprol-XL 50 mg.  I have suggested that he can take an extra 25 mg on an as-needed basis if there are days where experiences more palpitations.  I will see him in 6 months for reevaluation.  Time spent: 25 minutes [name], MD, [facility]  10/14/2016 4:10 PM

## 2016-10-18 ENCOUNTER — Ambulatory Visit (INDEPENDENT_AMBULATORY_CARE_PROVIDER_SITE_OTHER): Payer: Medicare Other | Admitting: Family Medicine

## 2016-10-18 ENCOUNTER — Encounter: Payer: Self-pay | Admitting: Family Medicine

## 2016-10-18 VITALS — BP 122/82 | HR 73 | Wt 251.0 lb

## 2016-10-18 DIAGNOSIS — G8929 Other chronic pain: Secondary | ICD-10-CM

## 2016-10-18 DIAGNOSIS — M9903 Segmental and somatic dysfunction of lumbar region: Secondary | ICD-10-CM | POA: Diagnosis not present

## 2016-10-18 DIAGNOSIS — M545 Low back pain: Secondary | ICD-10-CM

## 2016-10-18 DIAGNOSIS — M999 Biomechanical lesion, unspecified: Secondary | ICD-10-CM

## 2016-10-18 NOTE — Assessment & Plan Note (Signed)
Seems to be stable. No narrative degenerative disc disease. No longer having any difficulty with the rib fractures that he is had previously. Responding very well to conservative therapy and encourage him to continue to lose weight. Patient will follow-up with me again in 4-8 weeks

## 2016-10-18 NOTE — Progress Notes (Signed)
[name] [address] [place], [place] [address] Phone: [phone] Subjective:    CC: right back pain   [id number]  Clayton [name] is a 71 y.o. male coming in for follow up for back pain. He has been doing well, no complaints of pain. Still mild back pain No radiation of the pain      Past Medical History:  Diagnosis Date  . Arthritis    hips   . Atrial fibrillation ([place])   . Cataract   . Dysrhythmia    PAF- hx of 7 years ago   . Hypertension 05/29/10   ECHO-EF 67% wnl  . Meniere disease   . Sleep apnea 09/14/04   [facility]- Dr. Humphrey Rolls; CPAP titration 11/12/04- Dr. Humphrey Rolls   Past Surgical History:  Procedure Laterality Date  . CARDIAC CATHETERIZATION  11/03/05  . CATARACT EXTRACTION  2014  . KNEE ARTHROSCOPY     left knee- 2002   . TOTAL HIP ARTHROPLASTY  06/08/2011   Procedure: TOTAL HIP ARTHROPLASTY ANTERIOR APPROACH;  Surgeon: [name], MD;  Location: [facility];  Service: Orthopedics;  Laterality: Left;   Social History   Social History  . Marital status: Married    Spouse name: N/A  . Number of children: N/A  . Years of education: N/A   Social History Main Topics  . Smoking status: Never Smoker  . Smokeless tobacco: Never Used  . Alcohol use No  . Drug use: No  . Sexual activity: Yes    Birth control/ protection: None   Other Topics Concern  . None   Social History Narrative   Married. [name].Takes care of his mother-in-law as well.    Retired, Conservator, museum/gallery.    Drinks caffeine beverages. Takes a daily vitamin.   Exercises routinely.    Smoke detector in the home.    Ambulates independently.       Allergies  Allergen Reactions  . Statins    Family History  Problem Relation Age of Onset  . Hypertension Mother   . Stroke Mother   . Diabetes Mother   . Diabetes Sister   . Stroke Maternal Grandmother        47 died   . Heart disease Maternal Grandfather   . Hypertension Maternal  Grandfather   . Diabetes Maternal Grandfather   . Arthritis Father   . Heart disease Paternal Grandfather      Past medical history, social, surgical and family history all reviewed in electronic medical record.  No pertanent information unless stated regarding to the chief complaint.   Review of Systems:Review of systems updated and as accurate as of 10/18/16  No headache, visual changes, nausea, vomiting, diarrhea, constipation, dizziness, abdominal pain, skin rash, fevers, chills, night sweats, weight loss, swollen lymph nodes, body aches, joint swelling,  chest pain, shortness of breath, mood changes. + muscle aches and pains.   Objective  Blood pressure 122/82, pulse 73, weight 251 lb (113.9 kg), SpO2 95 %. Systems examined below as of 10/18/16   General: No apparent distress alert and oriented x3 mood and affect normal, dressed appropriately.  HEENT: Pupils equal, extraocular movements intact  Respiratory: Patient's speak in full sentences and does not appear short of breath  Cardiovascular: No lower extremity edema, non tender, no erythema  Skin: Warm dry intact with no signs of infection or rash on extremities or on axial skeleton.  Abdomen: Soft nontender  Neuro: Cranial nerves II through  XII are intact, neurovascularly intact in all extremities with 2+ DTRs and 2+ pulses.  Lymph: No lymphadenopathy of posterior or anterior cervical chain or axillae bilaterally.  Gait normal with good balance and coordination.  MSK:  Non tender with full range of motion and good stability and symmetric strength and tone of shoulders, elbows, wrist, hip, knee and ankles bilaterally.  Back Exam:  Inspection: Loss of lordosis Motion: Flexion 45 deg, Extension 25 deg, Side Bending to 35 deg bilaterally,  Rotation to 30 deg bilaterally  SLR laying: Negative  XSLR laying: Negative  Palpable tenderness: Tender to palpation in the paraspinal stretcher. FABER: negative. Sensory change: Gross  sensation intact to all lumbar and sacral dermatomes.  Reflexes: 2+ at both patellar tendons, 2+ at achilles tendons, Babinski's downgoing.  Strength at foot  Plantar-flexion: 5/5 Dorsi-flexion: 5/5 Eversion: 5/5 Inversion: 5/5  Leg strength  Quad: 5/5 Hamstring: 5/5 Hip flexor: 5/5 Hip abductors: 4/5 but symmetric Gait unremarkable.  Osteopathic findings C2 flexed rotated and side bent right C4 flexed rotated and side bent left C7 flexed rotated and side bent left T3 extended rotated and side bent right inhaled third rib T11 extended rotated and side bent left L2 flexed rotated and side bent right Sacrum right on right     Impression and Recommendations:     This case required medical decision making of moderate complexity.      Note: This dictation was prepared with Dragon dictation along with smaller phrase technology. Any transcriptional errors that result from this process are unintentional.

## 2016-10-18 NOTE — Patient Instructions (Signed)
Great to see you  Clayton [name] is your friend I am impressed Good luck with the ponies See me again in 7-8 weeks

## 2016-11-10 ENCOUNTER — Telehealth: Payer: Self-pay | Admitting: Cardiovascular Disease

## 2016-11-10 MED ORDER — PITAVASTATIN CALCIUM 2 MG PO TABS: 2.0000 mg | ORAL_TABLET | Freq: Every day | ORAL | 1 refills | Status: DC

## 2016-11-10 NOTE — Telephone Encounter (Signed)
Patient aware of results. He is tolerating livalo 1mg  and is aware to titrate up to 2mg  (rx sent to pharmacy). He will call if he has any issues tolerating.

## 2016-11-10 NOTE — Telephone Encounter (Signed)
Notes recorded by [name], MD on 11/02/2016 at 9:21 AM EDT If patient is on Zetia 10 mg and livalo 1 mg would titrate to 2 mg

## 2016-11-10 NOTE — Telephone Encounter (Signed)
NEw Message  Pt call requesting to speak with RN. Pt states he is returning RN call from last week. Please call back to discuss

## 2016-11-24 ENCOUNTER — Other Ambulatory Visit: Payer: Self-pay | Admitting: Cardiovascular Disease

## 2016-12-20 ENCOUNTER — Ambulatory Visit: Payer: Medicare Other | Admitting: Family Medicine

## 2017-01-19 NOTE — Progress Notes (Signed)
[name] [address] [place], [place] [address] Phone: [phone] Subjective:      CC: Back pain follow-up  [id number]  Clayton [name] is a 72 y.o. male coming in for follow up for back pain.  No changes since last visit. He states that he has been doing well. He has been working on core exercises which have helped his back pain. He is here for OMT today.  Patient has been doing remarkably well.  Been playing golf multiple times and continues to work out almost daily.  Has plateaued on his weight loss but feels it is secondary to more of the holidays.    Past Medical History:  Diagnosis Date  . Arthritis    hips   . Atrial fibrillation ([place])   . Cataract   . Dysrhythmia    PAF- hx of 7 years ago   . Hypertension 05/29/10   ECHO-EF 67% wnl  . Meniere disease   . Sleep apnea 09/14/04   [facility]- Dr. Humphrey Rolls; CPAP titration 11/12/04- Dr. Humphrey Rolls   Past Surgical History:  Procedure Laterality Date  . CARDIAC CATHETERIZATION  11/03/05  . CATARACT EXTRACTION  2014  . KNEE ARTHROSCOPY     left knee- 2002   . TOTAL HIP ARTHROPLASTY  06/08/2011   Procedure: TOTAL HIP ARTHROPLASTY ANTERIOR APPROACH;  Surgeon: [name], MD;  Location: [facility];  Service: Orthopedics;  Laterality: Left;   Social History   Socioeconomic History  . Marital status: Married    Spouse name: Not on file  . Number of children: Not on file  . Years of education: Not on file  . Highest education level: Not on file  Social Needs  . Financial resource strain: Not on file  . Food insecurity - worry: Not on file  . Food insecurity - inability: Not on file  . Transportation needs - medical: Not on file  . Transportation needs - non-medical: Not on file  Occupational History  . Not on file  Tobacco Use  . Smoking status: Never Smoker  . Smokeless tobacco: Never Used  Substance and Sexual Activity  . Alcohol use: No  . Drug use: No  . Sexual  activity: Yes    Birth control/protection: None  Other Topics Concern  . Not on file  Social History Narrative   Married. [name].Takes care of his mother-in-law as well.    Retired, Conservator, museum/gallery.    Drinks caffeine beverages. Takes a daily vitamin.   Exercises routinely.    Smoke detector in the home.    Ambulates independently.    Allergies  Allergen Reactions  . Statins    Family History  Problem Relation Age of Onset  . Hypertension Mother   . Stroke Mother   . Diabetes Mother   . Diabetes Sister   . Stroke Maternal Grandmother        70 died   . Heart disease Maternal Grandfather   . Hypertension Maternal Grandfather   . Diabetes Maternal Grandfather   . Arthritis Father   . Heart disease Paternal Grandfather      Past medical history, social, surgical and family history all reviewed in electronic medical record.  No pertanent information unless stated regarding to the chief complaint.   Review of Systems:Review of systems updated and as accurate as of 01/20/17  No headache, visual changes, nausea, vomiting, diarrhea, constipation, dizziness, abdominal pain, skin rash, fevers, chills, night sweats, weight loss,  swollen lymph nodes, body aches, joint swelling, chest pain, shortness of breath, mood changes.  Positive muscle aches  Objective  Blood pressure 118/74, pulse 80, weight 252 lb (114.3 kg), SpO2 98 %. Systems examined below as of 01/20/17   General: No apparent distress alert and oriented x3 mood and affect normal, dressed appropriately.  HEENT: Pupils equal, extraocular movements intact  Respiratory: Patient's speak in full sentences and does not appear short of breath  Cardiovascular: No lower extremity edema, non tender, no erythema  Skin: Warm dry intact with no signs of infection or rash on extremities or on axial skeleton.  Abdomen: Soft nontender  Neuro: Cranial nerves II through XII are intact, neurovascularly intact in all extremities with  2+ DTRs and 2+ pulses.  Lymph: No lymphadenopathy of posterior or anterior cervical chain or axillae bilaterally.  Gait normal with good balance and coordination.  MSK:  Non tender with full range of motion and good stability and symmetric strength and tone of shoulders, elbows, wrist, hip, knee and ankles bilaterally.  Mild arthritic changes of multiple joints Back Exam:  Inspection: Degenerative scoliosis with loss of lordosis noted Motion: Flexion 25 deg, Extension 25 deg, Side Bending to 35 deg bilaterally,  Rotation to 35 deg bilaterally  SLR laying: Negative  XSLR laying: Negative  Palpable tenderness: Tender to palpation over the right sacroiliac joint. FABER: Positive right. Sensory change: Gross sensation intact to all lumbar and sacral dermatomes.  Reflexes: 2+ at both patellar tendons, 2+ at achilles tendons, Babinski's downgoing.  Strength at foot  Plantar-flexion: 5/5 Dorsi-flexion: 5/5 Eversion: 5/5 Inversion: 5/5  Leg strength  Quad: 5/5 Hamstring: 5/5 Hip flexor: 5/5 Hip abductors: 4/5 but symmetric  Osteopathic findings C2 flexed rotated and side bent right C4 flexed rotated and side bent left T3 extended rotated and side bent right inhaled third rib T6 extended rotated and side bent left L3 flexed rotated and side bent right Sacrum right on right       Impression and Recommendations:     This case required medical decision making of moderate complexity.      Note: This dictation was prepared with Dragon dictation along with smaller phrase technology. Any transcriptional errors that result from this process are unintentional.

## 2017-01-20 ENCOUNTER — Ambulatory Visit: Payer: Medicare Other | Admitting: Family Medicine

## 2017-01-20 ENCOUNTER — Encounter: Payer: Self-pay | Admitting: Family Medicine

## 2017-01-20 VITALS — BP 118/74 | HR 80 | Wt 252.0 lb

## 2017-01-20 DIAGNOSIS — M9903 Segmental and somatic dysfunction of lumbar region: Secondary | ICD-10-CM

## 2017-01-20 DIAGNOSIS — G8929 Other chronic pain: Secondary | ICD-10-CM | POA: Diagnosis not present

## 2017-01-20 DIAGNOSIS — M545 Low back pain: Secondary | ICD-10-CM | POA: Diagnosis not present

## 2017-01-20 DIAGNOSIS — M999 Biomechanical lesion, unspecified: Secondary | ICD-10-CM

## 2017-01-20 NOTE — Assessment & Plan Note (Signed)
Decision today to treat with OMT was based on Physical Exam  After verbal consent patient was treated with HVLA, ME, FPR techniques in cervical, thoracic, lumbar and sacral areas  Patient tolerated the procedure well with improvement in symptoms  Patient given exercises, stretches and lifestyle modifications  See medications in patient instructions if given  Patient will follow up in 1-2 weeks 

## 2017-01-20 NOTE — Assessment & Plan Note (Signed)
Stable.  Patient does have degenerative disc disease in the.  Responded well to osteopathic manipulation.  Does not want to take any medications regularly and we have discussed gabapentin in the past.  We discussed icing regimen.  Follow-up with me again in 3 months

## 2017-01-20 NOTE — Patient Instructions (Signed)
See me again in 3 months  Enjoy the sun

## 2017-03-15 ENCOUNTER — Telehealth: Payer: Self-pay | Admitting: *Deleted

## 2017-03-15 NOTE — Telephone Encounter (Signed)
Faxed CPAP supply order to [facility]. 

## 2017-03-21 ENCOUNTER — Telehealth: Payer: Self-pay | Admitting: *Deleted

## 2017-03-21 NOTE — Telephone Encounter (Signed)
Faxed September 2018 office note to [facility] for requested face to face.

## 2017-04-20 ENCOUNTER — Ambulatory Visit: Payer: Medicare Other | Admitting: Family Medicine

## 2017-04-20 ENCOUNTER — Encounter: Payer: Self-pay | Admitting: Family Medicine

## 2017-04-20 VITALS — BP 142/80 | HR 102 | Ht 75.0 in | Wt 267.0 lb

## 2017-04-20 DIAGNOSIS — M545 Low back pain: Secondary | ICD-10-CM | POA: Diagnosis not present

## 2017-04-20 DIAGNOSIS — M999 Biomechanical lesion, unspecified: Secondary | ICD-10-CM

## 2017-04-20 DIAGNOSIS — G8929 Other chronic pain: Secondary | ICD-10-CM

## 2017-04-20 DIAGNOSIS — M9903 Segmental and somatic dysfunction of lumbar region: Secondary | ICD-10-CM | POA: Diagnosis not present

## 2017-04-20 NOTE — Progress Notes (Signed)
[name] [address], [place] [address] Phone: [phone] Subjective:    I'm seeing this patient by the request  of:    CC: Low back pain  [id number]  Clayton [name] is a 72 y.o. male coming in with complaint of low back pain.  Patient has been seen previously and does have some arthritic changes.  Has been responding well to osteopathic manipulation and home exercises.  Has been doing them very regularly.  Has been seen every 3 months.  No new pain just more tightness recently.  Continues to work on weight loss.     Past Medical History:  Diagnosis Date  . Arthritis    hips   . Atrial fibrillation ([place])   . Cataract   . Dysrhythmia    PAF- hx of 7 years ago   . Hypertension 05/29/10   ECHO-EF 67% wnl  . Meniere disease   . Sleep apnea 09/14/04   [facility]- Dr. Humphrey Rolls; CPAP titration 11/12/04- Dr. Humphrey Rolls   Past Surgical History:  Procedure Laterality Date  . CARDIAC CATHETERIZATION  11/03/05  . CATARACT EXTRACTION  2014  . KNEE ARTHROSCOPY     left knee- 2002   . TOTAL HIP ARTHROPLASTY  06/08/2011   Procedure: TOTAL HIP ARTHROPLASTY ANTERIOR APPROACH;  Surgeon: [name], MD;  Location: [facility];  Service: Orthopedics;  Laterality: Left;   Social History   Socioeconomic History  . Marital status: Married    Spouse name: Not on file  . Number of children: Not on file  . Years of education: Not on file  . Highest education level: Not on file  Occupational History  . Not on file  Social Needs  . Financial resource strain: Not on file  . Food insecurity:    Worry: Not on file    Inability: Not on file  . Transportation needs:    Medical: Not on file    Non-medical: Not on file  Tobacco Use  . Smoking status: Never Smoker  . Smokeless tobacco: Never Used  Substance and Sexual Activity  . Alcohol use: No  . Drug use: No  . Sexual activity: Yes    Birth control/protection: None  Lifestyle  .  Physical activity:    Days per week: Not on file    Minutes per session: Not on file  . Stress: Not on file  Relationships  . Social connections:    Talks on phone: Not on file    Gets together: Not on file    Attends religious service: Not on file    Active member of club or organization: Not on file    Attends meetings of clubs or organizations: Not on file    Relationship status: Not on file  Other Topics Concern  . Not on file  Social History Narrative   Married. [name].Takes care of his mother-in-law as well.    Retired, Conservator, museum/gallery.    Drinks caffeine beverages. Takes a daily vitamin.   Exercises routinely.    Smoke detector in the home.    Ambulates independently.    Allergies  Allergen Reactions  . Statins    Family History  Problem Relation Age of Onset  . Hypertension Mother   . Stroke Mother   . Diabetes Mother   . Diabetes Sister   . Stroke Maternal Grandmother        24 died   . Heart disease Maternal Grandfather   .  Hypertension Maternal Grandfather   . Diabetes Maternal Grandfather   . Arthritis Father   . Heart disease Paternal Grandfather      Past medical history, social, surgical and family history all reviewed in electronic medical record.  No pertanent information unless stated regarding to the chief complaint.   Review of Systems:Review of systems updated and as accurate as of 04/20/17  No headache, visual changes, nausea, vomiting, diarrhea, constipation, dizziness, abdominal pain, skin rash, fevers, chills, night sweats, weight loss, swollen lymph nodes, body aches, joint swelling, chest pain, shortness of breath, mood changes.  Positive muscle aches  Objective  Blood pressure (!) 142/80, pulse (!) 102, height 6\' 3"  (1.905 m), weight 267 lb (121.1 kg), SpO2 97 %. Systems examined below as of 04/20/17   General: No apparent distress alert and oriented x3 mood and affect normal, dressed appropriately.  HEENT: Pupils equal,  extraocular movements intact  Respiratory: Patient's speak in full sentences and does not appear short of breath  Cardiovascular: No lower extremity edema, non tender, no erythema  Skin: Warm dry intact with no signs of infection or rash on extremities or on axial skeleton.  Abdomen: Soft nontender  Neuro: Cranial nerves II through XII are intact, neurovascularly intact in all extremities with 2+ DTRs and 2+ pulses.  Lymph: No lymphadenopathy of posterior or anterior cervical chain or axillae bilaterally.  Gait normal with good balance and coordination.  MSK:  Non tender with full range of motion and good stability and symmetric strength and tone of shoulders, elbows, wrist, hip, knee and ankles bilaterally.  Back exam shows loss of lordosis.  Still has some room for improvement of core strength.  Mild limitation with side bending and rotation bilaterally by at least 5 degrees.  Positive Faber on the right side.  Mild tenderness to palpation over the sacroiliac joint.  Strength is full and symmetric in all extremities.  Osteopathic findings  C2 flexed rotated and side bent right T3 extended rotated and side bent right inhaled third rib L2 flexed rotated and side bent right Sacrum left on left     Impression and Recommendations:     This case required medical decision making of moderate complexity.      Note: This dictation was prepared with Dragon dictation along with smaller phrase technology. Any transcriptional errors that result from this process are unintentional.

## 2017-04-20 NOTE — Assessment & Plan Note (Signed)
Stable overall.  Patient has been doing remarkably well.  No significant changes in medications, vitamins, home exercises.  Continue to have patient be active.  Encouraged him to continue to work on weight loss.  Follow-up again in 4 months

## 2017-04-20 NOTE — Patient Instructions (Signed)
Good to see you  You are doing great overall  Can't wait to take golf lessons for you  Goal weight 222 And now new low 68 See me again in 3 months

## 2017-04-20 NOTE — Assessment & Plan Note (Signed)
Decision today to treat with OMT was based on Physical Exam  After verbal consent patient was treated with HVLA, ME, FPR techniques in cervical, thoracic, lumbar and sacral areas  Patient tolerated the procedure well with improvement in symptoms  Patient given exercises, stretches and lifestyle modifications  See medications in patient instructions if given  Patient will follow up in 1-2 weeks 

## 2017-05-20 ENCOUNTER — Telehealth: Payer: Self-pay | Admitting: Cardiovascular Disease

## 2017-05-20 NOTE — Telephone Encounter (Signed)
New Message:      Pt calls to make appt for next available dr. And pt states he needs sooner appt unless dr is okay with him being seen 10/11/17. Pls advise pt on when he should come.

## 2017-05-20 NOTE — Telephone Encounter (Signed)
LMTCB

## 2017-05-20 NOTE — Telephone Encounter (Signed)
Patient returned call. He wanted to make sure Dr. Claiborne Billings was OK with him coming for his appt in Sept instead of 6 months after his last visit, as MD has no sooner appts. He has no acute concerns. He was also worried that his medications would not be refilled when requested by the pharmacy. Explained that since he has been 09/2016, there should be no issues filling his meds. Offered him a sooner appt on 5/13 per [facility] RN but he declined d/t traveling. No additional assistance needed at this time.

## 2017-05-26 ENCOUNTER — Other Ambulatory Visit: Payer: Self-pay | Admitting: Cardiovascular Disease

## 2017-05-26 NOTE — Telephone Encounter (Signed)
Rx has been sent to the pharmacy electronically. ° °

## 2017-07-20 ENCOUNTER — Ambulatory Visit: Payer: Medicare Other | Admitting: Family Medicine

## 2017-07-26 NOTE — Progress Notes (Signed)
[name] [address], [place] [address] Phone: [phone] Subjective:     CC: Back pain follow-up  [id number]  Clayton [name] is a 72 y.o. male coming in with complaint of back pain.  Patient has had some moderate arthritic changes.  Has responded well to manipulation.  No new changes.  Only tightness.  Nothing is stopping him from activities     Past Medical History:  Diagnosis Date  . Arthritis    hips   . Atrial fibrillation ([place])   . Cataract   . Dysrhythmia    PAF- hx of 7 years ago   . Hypertension 05/29/10   ECHO-EF 67% wnl  . Meniere disease   . Sleep apnea 09/14/04   [facility]- Dr. Humphrey Rolls; CPAP titration 11/12/04- Dr. Humphrey Rolls   Past Surgical History:  Procedure Laterality Date  . CARDIAC CATHETERIZATION  11/03/05  . CATARACT EXTRACTION  2014  . KNEE ARTHROSCOPY     left knee- 2002   . TOTAL HIP ARTHROPLASTY  06/08/2011   Procedure: TOTAL HIP ARTHROPLASTY ANTERIOR APPROACH;  Surgeon: [name], MD;  Location: [facility];  Service: Orthopedics;  Laterality: Left;   Social History   Socioeconomic History  . Marital status: Married    Spouse name: Not on file  . Number of children: Not on file  . Years of education: Not on file  . Highest education level: Not on file  Occupational History  . Not on file  Social Needs  . Financial resource strain: Not on file  . Food insecurity:    Worry: Not on file    Inability: Not on file  . Transportation needs:    Medical: Not on file    Non-medical: Not on file  Tobacco Use  . Smoking status: Never Smoker  . Smokeless tobacco: Never Used  Substance and Sexual Activity  . Alcohol use: No  . Drug use: No  . Sexual activity: Yes    Birth control/protection: None  Lifestyle  . Physical activity:    Days per week: Not on file    Minutes per session: Not on file  . Stress: Not on file  Relationships  . Social connections:    Talks on phone: Not on  file    Gets together: Not on file    Attends religious service: Not on file    Active member of club or organization: Not on file    Attends meetings of clubs or organizations: Not on file    Relationship status: Not on file  Other Topics Concern  . Not on file  Social History Narrative   Married. [name].Takes care of his mother-in-law as well.    Retired, Conservator, museum/gallery.    Drinks caffeine beverages. Takes a daily vitamin.   Exercises routinely.    Smoke detector in the home.    Ambulates independently.    Allergies  Allergen Reactions  . Statins    Family History  Problem Relation Age of Onset  . Hypertension Mother   . Stroke Mother   . Diabetes Mother   . Diabetes Sister   . Stroke Maternal Grandmother        27 died   . Heart disease Maternal Grandfather   . Hypertension Maternal Grandfather   . Diabetes Maternal Grandfather   . Arthritis Father   . Heart disease Paternal Grandfather      Past medical history, social, surgical and family history  all reviewed in electronic medical record.  No pertanent information unless stated regarding to the chief complaint.   Review of Systems:Review of systems updated and as accurate as of 07/28/17  No headache, visual changes, nausea, vomiting, diarrhea, constipation, dizziness, abdominal pain, skin rash, fevers, chills, night sweats, weight loss, swollen lymph nodes, body aches, joint swelling, muscle aches, chest pain, shortness of breath, mood changes.   Objective  Blood pressure 128/76, pulse 93, height 6\' 3"  (1.905 m), weight 270 lb (122.5 kg), SpO2 97 %. Systems examined below as of 07/28/17   General: No apparent distress alert and oriented x3 mood and affect normal, dressed appropriately.  HEENT: Pupils equal, extraocular movements intact  Respiratory: Patient's speak in full sentences and does not appear short of breath  Cardiovascular: No lower extremity edema, non tender, no erythema  Skin: Warm dry intact  with no signs of infection or rash on extremities or on axial skeleton.  Abdomen: Soft nontender  Neuro: Cranial nerves II through XII are intact, neurovascularly intact in all extremities with 2+ DTRs and 2+ pulses.  Lymph: No lymphadenopathy of posterior or anterior cervical chain or axillae bilaterally.  Gait normal with good balance and coordination.  MSK:  Non tender with full range of motion and good stability and symmetric strength and tone of shoulders, elbows, wrist, hip, knee and ankles bilaterally.   Back Exam:  Inspection: Mild loss of lordosis. Motion: Flexion 45 deg, Extension 25 deg, Side Bending to 45 deg bilaterally,  Rotation to 45 deg bilaterally  SLR laying: Negative  XSLR laying: Negative  Palpable tenderness: Tightness noted in the paraspinal musculature lumbar spine on the right side. FABER: Tightness on the right. Sensory change: Gross sensation intact to all lumbar and sacral dermatomes.  Reflexes: 2+ at both patellar tendons, 2+ at achilles tendons, Babinski's downgoing.  Strength at foot  Plantar-flexion: 5/5 Dorsi-flexion: 5/5 Eversion: 5/5 Inversion: 5/5  Leg strength  Quad: 5/5 Hamstring: 5/5 Hip flexor: 5/5 Hip abductors: 5/5  Gait unremarkable.  Osteopathic findingst T5 extended rotated and side bent right i L3 flexed rotated and side bent right Sacrum right on right     Impression and Recommendations:     This case required medical decision making of moderate complexity.      Note: This dictation was prepared with Dragon dictation along with smaller phrase technology. Any transcriptional errors that result from this process are unintentional.

## 2017-07-28 ENCOUNTER — Encounter: Payer: Self-pay | Admitting: Family Medicine

## 2017-07-28 ENCOUNTER — Other Ambulatory Visit: Payer: Self-pay

## 2017-07-28 ENCOUNTER — Ambulatory Visit: Payer: Medicare Other | Admitting: Family Medicine

## 2017-07-28 VITALS — BP 128/76 | HR 93 | Ht 75.0 in | Wt 270.0 lb

## 2017-07-28 DIAGNOSIS — M545 Low back pain: Secondary | ICD-10-CM | POA: Diagnosis not present

## 2017-07-28 DIAGNOSIS — Z7689 Persons encountering health services in other specified circumstances: Secondary | ICD-10-CM

## 2017-07-28 DIAGNOSIS — Z6833 Body mass index (BMI) 33.0-33.9, adult: Secondary | ICD-10-CM

## 2017-07-28 DIAGNOSIS — M9903 Segmental and somatic dysfunction of lumbar region: Secondary | ICD-10-CM

## 2017-07-28 DIAGNOSIS — M999 Biomechanical lesion, unspecified: Secondary | ICD-10-CM | POA: Diagnosis not present

## 2017-07-28 DIAGNOSIS — M9902 Segmental and somatic dysfunction of thoracic region: Secondary | ICD-10-CM

## 2017-07-28 DIAGNOSIS — E669 Obesity, unspecified: Secondary | ICD-10-CM | POA: Diagnosis not present

## 2017-07-28 DIAGNOSIS — G8929 Other chronic pain: Secondary | ICD-10-CM

## 2017-07-28 DIAGNOSIS — M9904 Segmental and somatic dysfunction of sacral region: Secondary | ICD-10-CM | POA: Diagnosis not present

## 2017-07-28 MED ORDER — TRIAMCINOLONE ACETONIDE 0.5 % EX CREA: 1.0000 "application " | TOPICAL_CREAM | Freq: Two times a day (BID) | CUTANEOUS | 3 refills | Status: DC

## 2017-07-28 NOTE — Assessment & Plan Note (Signed)
Decision today to treat with OMT was based on Physical Exam  After verbal consent patient was treated with HVLA, ME, FPR techniques in  thoracic, lumbar and sacral areas  Patient tolerated the procedure well with improvement in symptoms  Patient given exercises, stretches and lifestyle modifications  See medications in patient instructions if given  Patient will follow up in 12 weeks 

## 2017-07-28 NOTE — Patient Instructions (Addendum)
Good to see you  Weight management will clal you  Cream I sent in for the next breakout.  See me again in 3 months

## 2017-07-28 NOTE — Assessment & Plan Note (Addendum)
Patient has been doing remarkably well.  Discussed with patient again at great length about home exercises, core strength.  Patient has had difficulty with weight and will be referred to the weight management [facility] wellness center.  We discussed icing regimen.  Patient will continue the over-the-counter medications.  Follow-up again in 12 weeks

## 2017-07-28 NOTE — Assessment & Plan Note (Signed)
  Patient last BMI is 33.8.  Continues to have difficulty with losing weight.  Patient is highly motivated.  Sent to weight management and wellness.

## 2017-08-23 ENCOUNTER — Ambulatory Visit: Payer: Medicare Other | Admitting: Family Medicine

## 2017-08-23 ENCOUNTER — Encounter: Payer: Self-pay | Admitting: Family Medicine

## 2017-08-23 VITALS — BP 134/89 | HR 78 | Temp 98.2°F | Resp 20 | Ht 75.0 in | Wt 265.0 lb

## 2017-08-23 DIAGNOSIS — S20219A Contusion of unspecified front wall of thorax, initial encounter: Secondary | ICD-10-CM

## 2017-08-23 NOTE — Progress Notes (Signed)
Clayton [name] , 1945/09/26, 72 y.o., male MRN: [id number] Patient Care Team    Relationship Specialty Notifications Start End  Ma Hillock, DO PCP - General Family Medicine  06/09/15   [name], MD Consulting Physician Cardiology  06/09/15   Rana Snare, MD Consulting Physician Urology  06/09/15   Warden Fillers, MD Consulting Physician Ophthalmology  06/09/15   Sharyne Peach, MD Consulting Physician Ophthalmology  06/09/15   Sydnee Levans, MD Consulting Physician Dermatology  06/09/15   Vicie Mutters, MD Consulting Physician Otolaryngology  06/09/15   Paralee Cancel, MD Consulting Physician Orthopedic Surgery  06/09/15     Chief Complaint  Patient presents with  . bruise    to chest area     Subjective: Pt presents for an OV with complaints of chest bruise of 1 week duration.  Associated symptoms include nothing. He reports he had a brief URI sx a few days prior that resolved. He was golfing at the same time also, did not think he was injured. He also had to move a large tree branch from his neighbors driveway, and again does not remember hitting the area, but also reports he was rushing to get the tree chopped and removed. He is on a daily baby ASA.  Pt has tried nothing to ease their symptoms.   Depression screen [facility] 2/9 06/09/2015  Decreased Interest 0  Down, Depressed, Hopeless 0  PHQ - 2 Score 0    Allergies  Allergen Reactions  . Statins    Social History   Tobacco Use  . Smoking status: Never Smoker  . Smokeless tobacco: Never Used  Substance Use Topics  . Alcohol use: No   Past Medical History:  Diagnosis Date  . Arthritis    hips   . Atrial fibrillation ([place])   . Cataract   . Dysrhythmia    PAF- hx of 7 years ago   . Hypertension 05/29/10   ECHO-EF 67% wnl  . Meniere disease   . Sleep apnea 09/14/04   [facility]- Dr. Humphrey Rolls; CPAP titration 11/12/04- Dr. Humphrey Rolls   Past Surgical History:  Procedure Laterality Date  .  CARDIAC CATHETERIZATION  11/03/05  . CATARACT EXTRACTION  2014  . KNEE ARTHROSCOPY     left knee- 2002   . TOTAL HIP ARTHROPLASTY  06/08/2011   Procedure: TOTAL HIP ARTHROPLASTY ANTERIOR APPROACH;  Surgeon: [name], MD;  Location: [facility];  Service: Orthopedics;  Laterality: Left;   Family History  Problem Relation Age of Onset  . Hypertension Mother   . Stroke Mother   . Diabetes Mother   . Diabetes Sister   . Stroke Maternal Grandmother        61 died   . Heart disease Maternal Grandfather   . Hypertension Maternal Grandfather   . Diabetes Maternal Grandfather   . Arthritis Father   . Heart disease Paternal Grandfather    Allergies as of 08/23/2017      Reactions   Statins       Medication List        Accurate as of 08/23/17 11:03 AM. Always use your most recent med list.          amLODipine-benazepril 10-40 MG capsule Commonly known as:  LOTREL TAKE 1 CAPSULE BY MOUTH EVERY EVENING.   aspirin EC 81 MG tablet Take 1 tablet (81 mg total) by mouth daily.   co-enzyme Q-10 30 MG capsule Take 200 mg by mouth daily.  ezetimibe 10 MG tablet Commonly known as:  ZETIA TAKE 1 TABLET (10 MG TOTAL) BY MOUTH DAILY.   GLUCOSAMINE COMPLEX PO Take 1,500 mg by mouth every morning.   hydrochlorothiazide 25 MG tablet Commonly known as:  HYDRODIURIL TAKE 1 TABLET (25 MG TOTAL) BY MOUTH EVERY MORNING.   metoprolol succinate 100 MG 24 hr tablet Commonly known as:  TOPROL-XL TAKE 1 TABLET (100 MG TOTAL) BY MOUTH EVERY EVENING. TAKE WITH OR IMMEDIATELY FOLLOWING A MEAL.   multivitamin with minerals Tabs tablet Take 1 tablet by mouth every evening.   OMEGA-3-6-9 PO Take 1 capsule by mouth every morning. Omega 3= 800mg ,omega 6=380mg ,omega 9 =300mg    Pitavastatin Calcium 1 MG Tabs Take 1 tablet (1 mg total) by mouth daily.   LIVALO 2 MG Tabs Generic drug:  Pitavastatin Calcium TAKE 1 TABLET BY MOUTH EVERY DAY   SAW PALMETTO COMPLEX PO Take 1 capsule by mouth every  morning.   spironolactone 25 MG tablet Commonly known as:  ALDACTONE TAKE 0.5 TABLETS (12.5 MG TOTAL) BY MOUTH DAILY.   triamcinolone cream 0.5 % Commonly known as:  KENALOG Apply 1 application topically 2 (two) times daily. To affected areas.   vitamin C 500 MG tablet Commonly known as:  ASCORBIC ACID Take 500 mg by mouth every morning.   zinc gluconate 50 MG tablet Take 50 mg by mouth every evening.       All past medical history, surgical history, allergies, family history, immunizations andmedications were updated in the EMR today and reviewed under the history and medication portions of their EMR.     ROS: Negative, with the exception of above mentioned in HPI   Objective:  BP 134/89 (BP Location: Left Arm, Patient Position: Sitting, Cuff Size: Large)   Pulse 78   Temp 98.2 F (36.8 C)   Resp 20   Ht 6\' 3"  (1.905 m)   Wt 265 lb (120.2 kg)   SpO2 98%   BMI 33.12 kg/m  Body mass index is 33.12 kg/m. Gen: Afebrile. No acute distress. Nontoxic in appearance, well developed, well nourished.  HENT: AT. [place].  MMM Neck/lymp/endocrine: Supple,no lymphadenopathy CV: RRR no murmur, chest wall non-tender to palpation. Yellow/green round healing bruise (size of orange) midline below sternal notch. No hematoma or mass palpated.  Chest: CTAB, no wheeze or crackles. Good air movement, normal resp effort.   No exam data present No results found. No results found for this or any previous visit (from the past 24 hour(s)).  Assessment/Plan: TAB RYLEE is a 72 y.o. male present for OV for  1. Contusion of chest wall, unspecified laterality, initial encounter - appears healing well. Possibly from injury and ASA use. Non tender. No hematoma or mass.  - AVS on contusion treatment. Monitor for changes (redness, swelling, pain).  - f/u PRN   Reviewed expectations re: course of current medical issues.  Discussed self-management of symptoms.  Outlined signs and symptoms  indicating need for more acute intervention.  Patient verbalized understanding and all questions were answered.  Patient received an After-Visit Summary.    No orders of the defined types were placed in this encounter.  Note is dictated utilizing voice recognition software. Although note has been proof read prior to signing, occasional typographical errors still can be missed. If any questions arise, please do not hesitate to call for verification.   electronically signed by:  Howard Pouch, DO  [place]

## 2017-08-23 NOTE — Patient Instructions (Signed)
Your bruise looks like it is healing. It was probably from cutting the branch, and you just did not realize it.  Monitor it for enlargement, but it looks ok.     Contusion A contusion is a deep bruise. Contusions happen when an injury causes bleeding under the skin. Symptoms of bruising include pain, swelling, and discolored skin. The skin may turn blue, purple, or yellow. Follow these instructions at home:  Rest the injured area.  If told, put ice on the injured area. ? Put ice in a plastic bag. ? Place a towel between your skin and the bag. ? Leave the ice on for 20 minutes, 2-3 times per day.  If told, put light pressure (compression) on the injured area using an elastic bandage. Make sure the bandage is not too tight. Remove it and put it back on as told by your doctor.  If possible, raise (elevate) the injured area above the level of your heart while you are sitting or lying down.  Take over-the-counter and prescription medicines only as told by your doctor. Contact a doctor if:  Your symptoms do not get better after several days of treatment.  Your symptoms get worse.  You have trouble moving the injured area. Get help right away if:  You have very bad pain.  You have a loss of feeling (numbness) in a hand or foot.  Your hand or foot turns pale or cold. This information is not intended to replace advice given to you by your health care provider. Make sure you discuss any questions you have with your health care provider. Document Released: 06/23/2007 Document Revised: 06/12/2015 Document Reviewed: 05/22/2014 Elsevier Interactive Patient Education  2018 [employer].

## 2017-09-26 ENCOUNTER — Encounter (INDEPENDENT_AMBULATORY_CARE_PROVIDER_SITE_OTHER): Payer: Self-pay

## 2017-10-11 ENCOUNTER — Ambulatory Visit: Payer: Medicare Other | Admitting: Cardiovascular Disease

## 2017-10-14 ENCOUNTER — Other Ambulatory Visit: Payer: Self-pay

## 2017-10-14 ENCOUNTER — Telehealth: Payer: Self-pay | Admitting: Cardiovascular Disease

## 2017-10-14 DIAGNOSIS — I10 Essential (primary) hypertension: Secondary | ICD-10-CM

## 2017-10-14 DIAGNOSIS — E7849 Other hyperlipidemia: Secondary | ICD-10-CM

## 2017-10-14 NOTE — Telephone Encounter (Signed)
Labs ordered.

## 2017-10-14 NOTE — Telephone Encounter (Signed)
Called, LVM advising patient that we could reorder the previous labs from before. Left call back number.

## 2017-10-14 NOTE — Telephone Encounter (Signed)
New Message:      Patient is questioning if labs are being ordered before his appt 10/18/17

## 2017-10-17 ENCOUNTER — Other Ambulatory Visit: Payer: Self-pay

## 2017-10-17 DIAGNOSIS — I10 Essential (primary) hypertension: Secondary | ICD-10-CM

## 2017-10-17 DIAGNOSIS — E782 Mixed hyperlipidemia: Secondary | ICD-10-CM

## 2017-10-17 LAB — LIPID PANEL W/O CHOL/HDL RATIO
Cholesterol, Total: 113 mg/dL (ref 100–199)
HDL: 45 mg/dL (ref >39)
LDL CALC: 50 mg/dL (ref 0–99)
TRIGLYCERIDES: 90 mg/dL (ref 0–149)
VLDL Cholesterol Cal: 18 mg/dL (ref 5–40)

## 2017-10-17 LAB — COMPREHENSIVE METABOLIC PANEL
ALBUMIN: 4.6 g/dL (ref 3.5–4.8)
ALT: 45 IU/L — ABNORMAL HIGH (ref 0–44)
AST: 36 IU/L (ref 0–40)
Albumin/Globulin Ratio: 2.1 (ref 1.2–2.2)
Alkaline Phosphatase: 59 IU/L (ref 39–117)
BUN / CREAT RATIO: 18 (ref 10–24)
BUN: 25 mg/dL (ref 8–27)
Bilirubin Total: 1.1 mg/dL (ref 0.0–1.2)
CALCIUM: 9.5 mg/dL (ref 8.6–10.2)
CO2: 20 mmol/L (ref 20–29)
CREATININE: 1.4 mg/dL — AB (ref 0.76–1.27)
Chloride: 104 mmol/L (ref 96–106)
GFR, EST AFRICAN AMERICAN: 58 mL/min/{1.73_m2} — AB (ref >59)
GFR, EST NON AFRICAN AMERICAN: 50 mL/min/{1.73_m2} — AB (ref >59)
GLOBULIN, TOTAL: 2.2 g/dL (ref 1.5–4.5)
Glucose: 108 mg/dL — ABNORMAL HIGH (ref 65–99)
Potassium: 4.5 mmol/L (ref 3.5–5.2)
SODIUM: 140 mmol/L (ref 134–144)
TOTAL PROTEIN: 6.8 g/dL (ref 6.0–8.5)

## 2017-10-18 ENCOUNTER — Ambulatory Visit: Payer: Medicare Other | Admitting: Cardiovascular Disease

## 2017-10-18 ENCOUNTER — Encounter: Payer: Self-pay | Admitting: Cardiovascular Disease

## 2017-10-18 VITALS — BP 144/84 | HR 81 | Ht 75.0 in | Wt 270.0 lb

## 2017-10-18 DIAGNOSIS — E782 Mixed hyperlipidemia: Secondary | ICD-10-CM | POA: Diagnosis not present

## 2017-10-18 DIAGNOSIS — I10 Essential (primary) hypertension: Secondary | ICD-10-CM | POA: Diagnosis not present

## 2017-10-18 DIAGNOSIS — I480 Paroxysmal atrial fibrillation: Secondary | ICD-10-CM | POA: Diagnosis not present

## 2017-10-18 DIAGNOSIS — G4733 Obstructive sleep apnea (adult) (pediatric): Secondary | ICD-10-CM

## 2017-10-18 DIAGNOSIS — Z9989 Dependence on other enabling machines and devices: Secondary | ICD-10-CM

## 2017-10-18 NOTE — Patient Instructions (Addendum)
Medication Instructions:  Your physician recommends that you continue on your current medications as directed. Please refer to the Current Medication list given to you today.  Labwork: Please return for FASTING labs in 1 year prior to appt (CMET, CBC, Lipid, TSH)-lab orders will be mailed to you closer to time.   Our in office lab hours are Monday-Friday 8:00-4:00, closed for lunch 12:45-1:45 pm.  No appointment needed.  Follow-Up: Your physician wants you to follow-up in: 1 year with Dr. Claiborne Billings.  You will receive a reminder letter in the mail two months in advance. If you don't receive a letter, please call our office to schedule the follow-up appointment.   Any Other Special Instructions Will Be Listed Below (If Applicable).     If you need a refill on your cardiac medications before your next appointment, please call your pharmacy.

## 2017-10-18 NOTE — Progress Notes (Signed)
Patient ID: Clayton [name], male   DOB: [birth year], 72 y.o.   MRN: [id number]     HPI: Clayton [name] is a 72 y.o. male who presents for a 12 month follow-up cardiology followup evaluation.  Clayton [name] has a history of hypertension, a remote history of PAF, status post cardioversion in 2007, obstructive sleep apnea for which he uses CPAP 100% of the time, hypertension, and hyperlipidemia. He has had weight fluctuations over the years.  In the past, he also had a history of Mnire's disease.  There was some concern by Dr. Ronette [name] that perhaps this may have been related to statin use, which led to its discontinuance.  He is not had Mnire's disease in some time.  He underwent left hip replacement surgery by Dr. Alvan [name] and now has been able to continue to be active and exercises routinely.   His  blood pressure regimen has been amlodipine/benazepril 10/40 daily, hydrochlorothiazide has been taking 25 Mill grams in the evening and Toprol-XL 100 mg daily. He had atrial of livalo  For hyperlipidemia but developed some diarrhea secondary to this.    He has not had much success with weight loss over the past several years.    He continues to use his CPAP therapy with 100% compliance.  He will not even take a nap without his CPAP therapy and he takes his CPAP unit on alll his travels.   Since initiating CPAP therapy, he is unaware of any palpitations.  He denies any recurrent atrial fibrillation.  In the past he has had difficulty with family stress and his father had passed away, and his wife's mother recently died after having developed lung CA and had significant PVD.  He was not exercising as much as he had in the past.  He has not been successful in weight loss.  He continues to be active and still walks 3-4 miles per day.  Oftentimes in the morning when he awakens his blood pressure is elevated at 150/90 but after exercise it drops to 128/80.  He is unaware of any recurrent atrial fibrillation.  He  continues to use CPAP with 100% compliance.   He completed a two-year grieving process.  On 01/19/2016 he committed to weight loss.  On January 1, he weighed 278 pounds .  He has been walking 4 miles 5 days per week as well as some intermittent stationary bike.  He has lost a total of 32 pounds since January.  He continues to use CPAP with 100% compliance and since he has been on CPAP, he has not had any recurrent atrial fibrillation and his blood pressure has been less labile.  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  He has had failures to Crestor and Lipitor in the past due to significant myalgias, development, even with weekly dosing.  I resumed zetia   In August 2018 he noticed his heart rate increasing and he presented for evaluation and was seen by Clayton [name], PAC.  He admitted to a rare palpitation.  He denied any chest pain with exertion or dyspnea on exertion.  He has suffered a broken rib.  This year.  He has a weight goal of 220 pounds.    Since I last saw him in September 2018, he has done well from a heart standpoint.  However, his father was ill and ultimately passed away in [place].  As result he was going back and forth to the [facility] area.  This resulted in  a change in his diet and ability to exercise.  As result his weight has gained from 240 back up to 270 pounds.  He is unaware of any episodes of atrial fibrillation.  He sleeps with CPAP with 100% compliance along with his naps.  He is committed to begin weight loss again.  He presents for evaluation.  Past Medical History:  Diagnosis Date  . Arthritis    hips   . Atrial fibrillation (Clayton [name])   . Cataract   . Dysrhythmia    PAF- hx of 7 years ago   . Hypertension 05/29/10   ECHO-EF 67% wnl  . Meniere disease   . Sleep apnea 09/14/04   [facility]- Clayton [name]; CPAP titration 11/12/04- Clayton [name]    Past Surgical History:  Procedure Laterality Date  . CARDIAC CATHETERIZATION  11/03/05  .  CATARACT EXTRACTION  2014  . KNEE ARTHROSCOPY     left knee- 2002   . TOTAL HIP ARTHROPLASTY  06/08/2011   Procedure: TOTAL HIP ARTHROPLASTY ANTERIOR APPROACH;  Surgeon: [name], MD;  Location: [facility];  Service: Orthopedics;  Laterality: Left;    Allergies  Allergen Reactions  . Statins     Current Outpatient Medications  Medication Sig Dispense Refill  . amLODipine-benazepril (LOTREL) 10-40 MG capsule TAKE 1 CAPSULE BY MOUTH EVERY EVENING. 90 capsule 3  . aspirin EC 81 MG tablet Take 1 tablet (81 mg total) by mouth daily. 90 tablet 3  . Boswellia-Glucosamine-Vit D (GLUCOSAMINE COMPLEX PO) Take 1,500 mg by mouth every morning.     Marland Kitchen co-enzyme Q-10 30 MG capsule Take 200 mg by mouth daily.     . hydrochlorothiazide (HYDRODIURIL) 25 MG tablet TAKE 1 TABLET (25 MG TOTAL) BY MOUTH EVERY MORNING. 90 tablet 3  . LIVALO 2 MG TABS TAKE 1 TABLET BY MOUTH EVERY DAY 90 tablet 1  . metoprolol succinate (TOPROL-XL) 100 MG 24 hr tablet TAKE 1 TABLET (100 MG TOTAL) BY MOUTH EVERY EVENING. TAKE WITH OR IMMEDIATELY FOLLOWING A MEAL. 90 tablet 3  . Multiple Vitamin (MULITIVITAMIN WITH MINERALS) TABS Take 1 tablet by mouth every evening.     [name] 3-6-9 Fatty Acids (OMEGA-3-6-9 PO) Take 1 capsule by mouth every morning. Omega 3= 848m,omega 6=3878momega 9 =30040m  . spironolactone (ALDACTONE) 25 MG tablet TAKE 0.5 TABLETS (12.5 MG TOTAL) BY MOUTH DAILY. 45 tablet 3  . triamcinolone cream (KENALOG) 0.5 % Apply 1 application topically 2 (two) times daily. To affected areas. 30 g 3  . vitamin C (ASCORBIC ACID) 500 MG tablet Take 500 mg by mouth every morning.     . zinc gluconate 50 MG tablet Take 50 mg by mouth every evening.     . ZMarland Kitchen-Pyg Afri-Nettle-Saw Palmet (SAW PALMETTO COMPLEX PO) Take 1 capsule by mouth every morning.     . ezetimibe (ZETIA) 10 MG tablet TAKE 1 TABLET (10 MG TOTAL) BY MOUTH DAILY. 90 tablet 3   No current facility-administered medications for this visit.     Socially he  is remarried for 8 years per his first wife passed away. 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  He exercises daily and walks 3 miles per day.  ROS General: Negative; No fevers, chills, or night sweats;  HEENT: Negative; No changes in vision or hearing, sinus congestion, difficulty swallowing Pulmonary: Negative; No cough, wheezing, shortness of breath, hemoptysis Cardiovascular: Negative; No  chest pain, presyncope, syncope, palpitations GI: Negative; No nausea, vomiting, diarrhea, or abdominal pain GU: Negative; No dysuria, hematuria, or difficulty voiding Musculoskeletal: He is status post rib fracture.  He is status post hip replacement. Hematologic/Oncology: Negative; no easy bruising, bleeding Endocrine: Negative; no heat/cold intolerance; no diabetes Neuro: No vertigo or imbalance issues Skin: Negative; No rashes or skin lesions Psychiatric: Negative; No behavioral problems, depression Sleep: Positive for obstructive sleep apnea on CPAP therapy with 100% compliance No snoring, daytime sleepiness, hypersomnolence, bruxism, restless legs, hypnogognic hallucinations, no cataplexy Other comprehensive 14 point system review is negative.  PE BP (!) 144/84   Pulse 81   Ht _0  (1.905 m)   Wt 270 lb (122.5 kg)   SpO2 95%   BMI 33.75 kg/m    Repeat blood pressure by me was 124/82  Wt Readings from Last 3 Encounters:  10/18/17 270 lb (122.5 kg)  08/23/17 265 lb (120.2 kg)  07/28/17 270 lb (122.5 kg)    General: Alert, oriented, no distress.  Skin: normal turgor, no rashes, warm and dry HEENT: Normocephalic, atraumatic. Pupils equal round and reactive to light; sclera anicteric; extraocular muscles intact;  Nose without nasal septal hypertrophy Mouth/Parynx benign; Mallinpatti scale 3 Neck: No JVD, no carotid bruits; normal carotid upstroke Lungs: clear to ausculatation and percussion; no wheezing  or rales Chest wall: without tenderness to palpitation Heart: PMI not displaced, RRR, s1 s2 normal, 1/6 systolic murmur, no diastolic murmur, no rubs, gallops, thrills, or heaves Abdomen: soft, nontender; no hepatosplenomehaly, BS+; abdominal aorta nontender and not dilated by palpation. Back: no CVA tenderness Pulses 2+ Musculoskeletal: full range of motion, normal strength, no joint deformities Extremities: no clubbing cyanosis or edema, Homan's sign negative  Neurologic: grossly nonfocal; Cranial nerves grossly wnl Psychologic: Normal mood and affect   ECG (independently read by me): Normal sinus rhythm at 81 bpm.  Nonspecific intraventricular conduction delay.  Left axis deviation  September 2018 ECG (independently read by me): Normal sinus rhythm at 63 bpm.  Left bundle branch block with repolarization changes.  Normal intervals.  No ectopy.  April 2018 ECG (independently read by me): Normal sinus rhythm at 68 bpm.  LVH with repolarization.  Normal intervals.  November 2017 ECG (independently read by me): Normal sinus rhythm at 78 bpm.  Nonspecific interventricular delay.  QRS complex V1 V2.  August 2016 ECG (independently read by me): Normal sinus rhythm at 77 bpm.  LVH with repolarization changes.  Left axis deviation.  December 2015 ECG (independently read by me): Normal sinus rhythm at 87 bpm.  LVH by voltage criteria with T-wave changes in leads 1 and aVL.  July 2014 ECG: Normal sinus rhythm at 76 beats per minute. LVH with QRS widening.  LABS:  BMP Latest Ref Rng & Units 10/17/2017 10/12/2016 04/14/2016  Glucose 65 - 99 mg/dL 108(H) 111(H) 104(H)  BUN 8 - 27 mg/dL 25 24 29(H)  Creatinine 0.76 - 1.27 mg/dL 1.40(H) 1.32(H) 1.44(H)  BUN/Creat Ratio 10 - _1 -  Sodium 134 - 144 mmol/L 140 138 138  Potassium 3.5 - 5.2 mmol/L 4.5 4.5 4.5  Chloride 96 - 106 mmol/L 104 104 103  CO2 20 - 29 mmol/L _2 Calcium 8.6 - 10.2 mg/dL 9.5 9.4 9.5   Hepatic Function Latest Ref  Rng & Units 10/17/2017 10/12/2016 04/14/2016  Total Protein 6.0 - 8.5 g/dL 6.8 6.7 6.9  Albumin 3.5 - 4.8 g/dL 4.6 - 4.3  AST 0 - 40 IU/L 36  21 24  ALT 0 - 44 IU/L 45(H) 23 26  Alk Phosphatase 39 - 117 IU/L 59 - 63  Total Bilirubin 0.0 - 1.2 mg/dL 1.1 1.0 0.9   CBC Latest Ref Rng & Units 12/08/2015 08/27/2014 12/17/2013  WBC 3.8 - 10.8 K/uL 5.4 5.0 5.1  Hemoglobin 13.2 - 17.1 g/dL 15.2 15.3 15.7  Hematocrit 38.5 - 50.0 % 44.1 44.1 45.3  Platelets 140 - 400 K/uL 194 199 209   Lab Results  Component Value Date   MCV 90.6 12/08/2015   MCV 88.4 08/27/2014   MCV 87.8 12/17/2013   Lab Results  Component Value Date   TSH 1.28 12/08/2015    Lab Results  Component Value Date   HGBA1C 5.4 04/14/2016    Lipid Panel     Component Value Date/Time   CHOL 113 10/17/2017 0858   TRIG 90 10/17/2017 0858   HDL 45 10/17/2017 0858   CHOLHDL 3.8 10/12/2016 0902   VLDL 17 04/14/2016 0937   LDLCALC 50 10/17/2017 0858   LDLCALC 119 (H) 10/12/2016 0902     RADIOLOGY: No results found.  IMPRESSION:  1. Essential hypertension   2. Mixed hyperlipidemia   3. PAF (paroxysmal atrial fibrillation) ([place])   4. OSA on CPAP     ASSESSMENT AND PLAN: Mr Jorge is a 72 year old WM who has a long-standing history of hypertension and has been on amlodipine/benazepril 10/40, HCTZ 25 mg, Toprol-XL 100 mg in addition to spironolactone 12.5 mg daily.  This regimen, his blood pressure has stabilized.  His blood pressure at home on average is 128/70.  Blood pressure today was well controlled and repeat by me 124/82.  He continues to be active.  He enjoys playing golf but has had some issues from an orthopedic standpoint.  This past year, he shot his age and also had a cold a hole in 1.  Unfortunately, he has regained 30 pounds that he had previously lost.  He is now seeing Hulan Saas.  He denies any anginal symptoms.  He had experienced some lower sternal anterior chest pain last year and was found to have had a  broken rib which may have occurred as result of chiropractic manipulation.  He continues to take Zetia 10 mg and has tolerated Livalo 2 mg daily with reference to his lipids.  Lab work done yesterday was excellent with a total cholesterol 113, triglycerides 90, HDL 45, and LDL 50.  He has very mild renal insufficiency with a creatinine of 1.4 which correlates to stage III kidney disease.  He continues to use CPAP with 100% compliance.  He was notified by Anderson Malta at [facility] that he qualifies for a new machine.  We will schedule him for a ResMed air since 10 auto CPAP unit.  We will obtain a download after his new machine to verify optimal pressure.  He has not had any recurrent atrial fibrillation since initiating CPAP therapy.  As long as he remains stable, I will see him in 1 year for reevaluation and prior to that office visit repeat laboratory will be obtained.  Time spent: 25 minutes [name], MD, [facility]  10/18/2017 3:10 PM

## 2017-10-20 ENCOUNTER — Telehealth: Payer: Self-pay | Admitting: *Deleted

## 2017-10-20 NOTE — Telephone Encounter (Signed)
-----   Message from Silverio Lay, RN sent at 10/18/2017  3:15 PM EDT ----- Regarding: new machine [facility]  Needs order for new CPAP machine-OV signed Resmed Airsense 10 Auto (at current settings)

## 2017-10-20 NOTE — Telephone Encounter (Signed)
Order for AirSense 10 auto CPAP machine with supplies faxed to [facility].

## 2017-10-27 ENCOUNTER — Encounter (INDEPENDENT_AMBULATORY_CARE_PROVIDER_SITE_OTHER): Payer: Self-pay

## 2017-11-01 ENCOUNTER — Encounter (INDEPENDENT_AMBULATORY_CARE_PROVIDER_SITE_OTHER): Payer: Self-pay | Admitting: Family Medicine

## 2017-11-01 ENCOUNTER — Ambulatory Visit (INDEPENDENT_AMBULATORY_CARE_PROVIDER_SITE_OTHER): Payer: Medicare Other | Admitting: Family Medicine

## 2017-11-01 VITALS — BP 151/80 | HR 64 | Temp 98.0°F | Ht 73.0 in | Wt 266.0 lb

## 2017-11-01 DIAGNOSIS — R739 Hyperglycemia, unspecified: Secondary | ICD-10-CM

## 2017-11-01 DIAGNOSIS — R5383 Other fatigue: Secondary | ICD-10-CM

## 2017-11-01 DIAGNOSIS — G4733 Obstructive sleep apnea (adult) (pediatric): Secondary | ICD-10-CM | POA: Diagnosis not present

## 2017-11-01 DIAGNOSIS — Z1331 Encounter for screening for depression: Secondary | ICD-10-CM

## 2017-11-01 DIAGNOSIS — Z6835 Body mass index (BMI) 35.0-35.9, adult: Secondary | ICD-10-CM | POA: Diagnosis not present

## 2017-11-01 DIAGNOSIS — I10 Essential (primary) hypertension: Secondary | ICD-10-CM | POA: Diagnosis not present

## 2017-11-01 DIAGNOSIS — Z0289 Encounter for other administrative examinations: Secondary | ICD-10-CM

## 2017-11-02 LAB — VITAMIN D 25 HYDROXY (VIT D DEFICIENCY, FRACTURES): Vit D, 25-Hydroxy: 45.2 ng/mL (ref 30.0–100.0)

## 2017-11-02 LAB — INSULIN, RANDOM: INSULIN: 22.4 u[IU]/mL (ref 2.6–24.9)

## 2017-11-02 LAB — HEMOGLOBIN A1C
Est. average glucose Bld gHb Est-mCnc: 126 mg/dL
HEMOGLOBIN A1C: 6 % — AB (ref 4.8–5.6)

## 2017-11-02 NOTE — Progress Notes (Signed)
.  Office: [phone]  /  Fax: [phone]   HPI:   Chief Complaint: OBESITY  Clayton [name] (MR# [id number]) is a 72 y.o. male who presents on 11/02/2017 for obesity evaluation and treatment. Current BMI is Body mass index is 35.09 kg/m.Marland Kitchen Clayton [name] has struggled with obesity for years and has been unsuccessful in either losing weight or maintaining long term weight loss. Clayton [name] attended our information session and states he is currently in the action stage of change and ready to dedicate time achieving and maintaining a healthier weight.  Clayton [name] states his family eats meals together he thinks his family will eat healthier with  him his desired weight loss is 63 lbs he started gaining weight at age 31 his heaviest weight ever was 280 lbs. he has significant food cravings issues  he snacks frequently in the evenings he skips meals frequently he is frequently drinking liquids with calories he frequently eats larger portions than normal    Fatigue Clayton [name] feels his energy is lower than it should be. This has worsened with weight gain and has not worsened recently. Clayton [name] denies daytime somnolence and denies waking up still tired. Patient has a history of obstructive sleep apnea. Patent has a history of symptoms of hypertension. Patient generally gets 7 hours of sleep per night, and states they generally have  restful sleep. Snoring is not present when using CPAP. Apneic episodes are not present when using CPAP. Epworth Sleepiness Score is 0  Hypertension Clayton [name] is a 72 y.o. male with hypertension and he is on multiple medications. His blood pressure is elevated today at 151/80  Clayton [name] denies chest pain. He is attempting to work on weight loss to help control his blood pressure with the goal of decreasing his risk of heart attack and stroke. Clayton [name] blood pressure is not currently controlled.  Hyperglycemia Clayton [name] has a history of some elevated blood glucose readings  without a diagnosis of diabetes. He is not on metformin and his last fasting blood sugar was at 108. There is no recent Hgb A1c lab result. Clayton [name] admits to polyphagia.  Sleep Apnea Clayton [name] has a history of sleep apnea and he is on CPAP regularly. Clayton [name] still notes fatigue, but he sleeps seven hours per night and wakes refreshed. He has an Epworth score of 0!  Depression Screen Clayton [name] Food and Mood (modified PHQ-9) score was  Depression screen PHQ 2/9 11/01/2017  Decreased Interest 1  Down, Depressed, Hopeless 0  PHQ - 2 Score 1  Altered sleeping 3  Tired, decreased energy 1  Change in appetite 0  Feeling bad or failure about yourself  0  Trouble concentrating 0  Moving slowly or fidgety/restless 0  Suicidal thoughts 0  PHQ-9 Score 5  Difficult doing work/chores Not difficult at all    ALLERGIES: Allergies  Allergen Reactions  . Statins     MEDICATIONS: Current Outpatient Medications on File Prior to Visit  Medication Sig Dispense Refill  . amLODipine-benazepril (LOTREL) 10-40 MG capsule TAKE 1 CAPSULE BY MOUTH EVERY EVENING. 90 capsule 3  . aspirin EC 81 MG tablet Take 1 tablet (81 mg total) by mouth daily. 90 tablet 3  . [place]well[place]-Glucosamine-Vit D (GLUCOSAMINE COMPLEX PO) Take 1,500 mg by mouth every morning.     Marland Kitchen co-enzyme Q-10 30 MG capsule Take 200 mg by mouth daily.     . hydrochlorothiazide (HYDRODIURIL) 25 MG tablet TAKE 1 TABLET (25 MG TOTAL) BY MOUTH EVERY MORNING. 90 tablet 3  .  LIVALO 2 MG TABS TAKE 1 TABLET BY MOUTH EVERY DAY 90 tablet 1  . metoprolol succinate (TOPROL-XL) 100 MG 24 hr tablet TAKE 1 TABLET (100 MG TOTAL) BY MOUTH EVERY EVENING. TAKE WITH OR IMMEDIATELY FOLLOWING A MEAL. 90 tablet 3  . Multiple Vitamin (MULITIVITAMIN WITH MINERALS) TABS Take 1 tablet by mouth every evening.     [name] 3-6-9 Fatty Acids (OMEGA-3-6-9 PO) Take 1 capsule by mouth every morning. Omega 3= 800mg ,omega 6=380mg ,omega 9 =300mg     . spironolactone (ALDACTONE) 25 MG  tablet TAKE 0.5 TABLETS (12.5 MG TOTAL) BY MOUTH DAILY. 45 tablet 3  . triamcinolone cream (KENALOG) 0.5 % Apply 1 application topically 2 (two) times daily. To affected areas. 30 g 3  . vitamin C (ASCORBIC ACID) 500 MG tablet Take 500 mg by mouth every morning.     . zinc gluconate 50 MG tablet Take 50 mg by mouth every evening.     Marland Kitchen Zn-Pyg Afri-Nettle-Saw Palmet (SAW PALMETTO COMPLEX PO) Take 1 capsule by mouth every morning.      No current facility-administered medications on file prior to visit.     PAST MEDICAL HISTORY: Past Medical History:  Diagnosis Date  . Arthritis    hips   . Atrial fibrillation ([place])   . Cataract   . Dysrhythmia    PAF- hx of 7 years ago   . HLD (hyperlipidemia)   . Hypertension 05/29/10   ECHO-EF 67% wnl  . Meniere disease   . Obesity   . Palpitations   . Sleep apnea 09/14/04   [facility]- Dr. Humphrey Rolls; CPAP titration 11/12/04- Dr. Humphrey Rolls    PAST SURGICAL HISTORY: Past Surgical History:  Procedure Laterality Date  . CARDIAC CATHETERIZATION  11/03/05  . CATARACT EXTRACTION  2014  . KNEE ARTHROSCOPY     left knee- 2002   . TOTAL HIP ARTHROPLASTY  06/08/2011   Procedure: TOTAL HIP ARTHROPLASTY ANTERIOR APPROACH;  Surgeon: [name], MD;  Location: [facility];  Service: Orthopedics;  Laterality: Left;    SOCIAL HISTORY: Social History   Tobacco Use  . Smoking status: Never Smoker  . Smokeless tobacco: Never Used  Substance Use Topics  . Alcohol use: No  . Drug use: No    FAMILY HISTORY: Family History  Problem Relation Age of Onset  . Hypertension Mother   . Stroke Mother   . Diabetes Mother   . Hyperlipidemia Mother   . Obesity Mother   . Diabetes Sister   . Stroke Maternal Grandmother        43 died   . Heart disease Maternal Grandfather   . Hypertension Maternal Grandfather   . Diabetes Maternal Grandfather   . Arthritis Father   . Heart disease Father   . Heart disease Paternal Grandfather     ROS: Review  of Systems  Constitutional: Positive for malaise/fatigue.  Eyes:       + Floaters  Endo/Heme/Allergies:       Positive for polyphagia    PHYSICAL EXAM: Blood pressure (!) 151/80, pulse 64, temperature 98 F (36.7 C), temperature source Oral, height 6\' 1"  (1.854 m), weight 266 lb (120.7 kg), SpO2 97 %. Body mass index is 35.09 kg/m. Physical Exam  Constitutional: He is oriented to person, place, and time. He appears well-developed and well-nourished.  HENT:  Head: Normocephalic and atraumatic.  Nose: Nose normal.  Eyes: EOM are normal.  Neck: Normal range of motion. Neck supple. No thyromegaly present.  Cardiovascular: Normal  rate and regular rhythm.  Pulmonary/Chest: Effort normal. No respiratory distress.  Abdominal: Soft. There is no tenderness.  Musculoskeletal: Normal range of motion.  Neurological: He is alert and oriented to person, place, and time.  Skin: Skin is warm and dry.  Psychiatric: He has a normal mood and affect. His behavior is normal.  Vitals reviewed.   RECENT LABS AND TESTS: BMET    Component Value Date/Time   NA 140 10/17/2017 0858   K 4.5 10/17/2017 0858   CL 104 10/17/2017 0858   CO2 20 10/17/2017 0858   GLUCOSE 108 (H) 10/17/2017 0858   GLUCOSE 111 (H) 10/12/2016 0902   BUN 25 10/17/2017 0858   CREATININE 1.40 (H) 10/17/2017 0858   CREATININE 1.32 (H) 10/12/2016 0902   CALCIUM 9.5 10/17/2017 0858   GFRNONAA 50 (L) 10/17/2017 0858   GFRNONAA 54 (L) 10/12/2016 0902   GFRAA 58 (L) 10/17/2017 0858   GFRAA 63 10/12/2016 0902   Lab Results  Component Value Date   HGBA1C 6.0 (H) 11/01/2017   Lab Results  Component Value Date   INSULIN 22.4 11/01/2017   CBC    Component Value Date/Time   WBC 5.4 12/08/2015 0945   RBC 4.87 12/08/2015 0945   HGB 15.2 12/08/2015 0945   HCT 44.1 12/08/2015 0945   PLT 194 12/08/2015 0945   MCV 90.6 12/08/2015 0945   MCH 31.2 12/08/2015 0945   MCHC 34.5 12/08/2015 0945   RDW 13.6 12/08/2015 0945    LYMPHSABS 1.5 08/27/2014 1030   MONOABS 0.5 08/27/2014 1030   EOSABS 0.1 08/27/2014 1030   BASOSABS 0.0 08/27/2014 1030   Iron/TIBC/Ferritin/ %Sat No results found for: IRON, TIBC, FERRITIN, IRONPCTSAT Lipid Panel     Component Value Date/Time   CHOL 113 10/17/2017 0858   TRIG 90 10/17/2017 0858   HDL 45 10/17/2017 0858   CHOLHDL 3.8 10/12/2016 0902   VLDL 17 04/14/2016 0937   LDLCALC 50 10/17/2017 0858   LDLCALC 119 (H) 10/12/2016 0902   Hepatic Function Panel     Component Value Date/Time   PROT 6.8 10/17/2017 0858   ALBUMIN 4.6 10/17/2017 0858   AST 36 10/17/2017 0858   ALT 45 (H) 10/17/2017 0858   ALKPHOS 59 10/17/2017 0858   BILITOT 1.1 10/17/2017 0858      Component Value Date/Time   TSH 1.28 12/08/2015 0945   Vitamin D There are no recent lab results  INDIRECT CALORIMETER done today shows a VO2 of 294 and a REE of 2049. His calculated basal metabolic rate is 2951 thus his basal metabolic rate is worse than expected.    ASSESSMENT AND PLAN: Other fatigue - Plan: VITAMIN D 25 Hydroxy (Vit-D Deficiency, Fractures)  Essential hypertension  Hyperglycemia - Plan: Hemoglobin A1c, Insulin, random  Obstructive sleep apnea syndrome  Depression screening  Class 2 severe obesity with serious comorbidity and body mass index (BMI) of 35.0 to 35.9 in adult, unspecified obesity type ([place])  PLAN:  Fatigue Clayton [name] was informed that his fatigue may be related to obesity, depression or many other causes. Labs will be ordered, and in the meanwhile Clayton [name] has agreed to work on diet, exercise and weight loss to help with fatigue. Proper sleep hygiene was discussed including the need for 7-8 hours of quality sleep each night. A sleep study was not ordered based on symptoms and Epworth score.  Hypertension We discussed sodium restriction, working on healthy weight loss, and a regular exercise program as the means to achieve improved blood pressure  control. Danyel agreed with  this plan and agreed to follow up as directed. We will recheck blood pressure in 2 weeks and will continue to monitor his blood pressure as well as his progress with the above lifestyle modifications. He will continue his medications as prescribed and will watch for signs of hypotension as he continues his lifestyle modifications.  Hyperglycemia Fasting labs will be obtained and results with be discussed with Clayton [name] in 2 weeks at his follow up visit. In the meanwhile Clayton [name] was started on a lower simple carbohydrate diet and will work on weight loss efforts.  Sleep Apnea We will order indirect calorimetry today and Clayton [name] agrees to follow up with our clinic in 2 weeks.  Depression Screen Clayton [name] had a mildly positive depression screening. Depression is commonly associated with obesity and often results in emotional eating behaviors. We will monitor this closely and work on CBT to help improve the non-hunger eating patterns. Referral to Psychology may be required if no improvement is seen as he continues in our clinic.  Obesity Clayton [name] is currently in the action stage of change and his goal is to continue with weight loss efforts He has agreed to follow the Category 4 plan Clayton [name] has been instructed to work up to a goal of 150 minutes of combined cardio and strengthening exercise per week for weight loss and overall health benefits. We discussed the following Behavioral Modification Strategies today: increasing lean protein intake, decreasing simple carbohydrates  and work on meal planning and easy cooking plans  Clayton [name] has agreed to follow up with our clinic in 2 weeks. He was informed of the importance of frequent follow up visits to maximize his success with intensive lifestyle modifications for his multiple health conditions. He was informed we would discuss his lab results at his next visit unless there is a critical issue that needs to be addressed sooner. Clayton [name] agreed to keep his next visit at  the agreed upon time to discuss these results.    OBESITY BEHAVIORAL INTERVENTION VISIT  Today's visit was # 1   Starting weight: 266 lbs Starting date: 11/01/17 Today's weight : 266 lbs  Today's date: 11/01/2017 Total lbs lost to date: 0 At least 15 minutes were spent on discussing the following behavioral intervention visit.   ASK: We discussed the diagnosis of obesity with Clayton [name] today and Landyn agreed to give [name] permission to discuss obesity behavioral modification therapy today.  ASSESS: Ananias has the diagnosis of obesity and his BMI today is 35.1 Soua is in the action stage of change   ADVISE: Criss was educated on the multiple health risks of obesity as well as the benefit of weight loss to improve his health. He was advised of the need for long term treatment and the importance of lifestyle modifications to improve his current health and to decrease his risk of future health problems.  AGREE: Multiple dietary modification options and treatment options were discussed and  Olvin agreed to follow the recommendations documented in the above note.  ARRANGE: Matisse was educated on the importance of frequent visits to treat obesity as outlined per CMS and USPSTF guidelines and agreed to schedule his next follow up appointment today.   I, Doreene Nest, am acting as transcriptionist for Dennard Nip, MD  I have reviewed the above documentation for accuracy and completeness, and I agree with the above. -Dennard Nip, MD

## 2017-11-15 ENCOUNTER — Ambulatory Visit (INDEPENDENT_AMBULATORY_CARE_PROVIDER_SITE_OTHER): Payer: Medicare Other | Admitting: Family Medicine

## 2017-11-15 VITALS — BP 128/69 | HR 61 | Temp 98.2°F | Ht 73.0 in | Wt 263.0 lb

## 2017-11-15 DIAGNOSIS — R7303 Prediabetes: Secondary | ICD-10-CM

## 2017-11-15 DIAGNOSIS — E669 Obesity, unspecified: Secondary | ICD-10-CM | POA: Diagnosis not present

## 2017-11-15 DIAGNOSIS — Z6834 Body mass index (BMI) 34.0-34.9, adult: Secondary | ICD-10-CM

## 2017-11-15 DIAGNOSIS — E559 Vitamin D deficiency, unspecified: Secondary | ICD-10-CM | POA: Diagnosis not present

## 2017-11-16 ENCOUNTER — Other Ambulatory Visit: Payer: Self-pay | Admitting: Cardiovascular Disease

## 2017-11-16 MED ORDER — METFORMIN HCL 500 MG PO TABS: 500.0000 mg | ORAL_TABLET | Freq: Every day | ORAL | 0 refills | Status: DC

## 2017-11-16 NOTE — Progress Notes (Signed)
Office: [phone]  /  Fax: [phone]   HPI:   Chief Complaint: OBESITY Clayton [name] is here to discuss his progress with his obesity treatment plan. He is on the Category 4 plan and is following his eating plan approximately 90 to 95 % of the time. He states he is using exercise bike and weights 35 minutes 4 times per week. Clayton [name] did well with weight loss on his Category 4 plan. He journaled all of his food and estimated the calories. He missed using olive oil while cooking.  His weight is 263 lb (119.3 kg) today and has had a weight loss of 3 pounds over a period of 2 weeks since his last visit. He has lost 3 lbs since starting treatment with [name].  Vitamin D deficiency Clayton [name] has a diagnosis of vitamin D deficiency. He is currently taking OTC vit D and is almost at goal. He denies nausea, vomiting or muscle weakness.  Pre-Diabetes Clayton [name] has a diagnosis of pre-diabetes based on his elevated Hgb A1c and was informed this puts him at greater risk of developing diabetes. He is not taking metformin currently and continues to work on diet and exercise to decrease risk of diabetes. He admits that polyphagia is worse in the evening. His last A1c was elevated to 6.0 and fasting Insulin was elevated to 22.4 on 11/01/17.  ALLERGIES: Allergies  Allergen Reactions  . Statins     MEDICATIONS: Current Outpatient Medications on File Prior to Visit  Medication Sig Dispense Refill  . amLODipine-benazepril (LOTREL) 10-40 MG capsule TAKE 1 CAPSULE BY MOUTH EVERY EVENING. 90 capsule 3  . aspirin EC 81 MG tablet Take 1 tablet (81 mg total) by mouth daily. 90 tablet 3  . Boswellia-Glucosamine-Vit D (GLUCOSAMINE COMPLEX PO) Take 1,500 mg by mouth every morning.     Marland Kitchen co-enzyme Q-10 30 MG capsule Take 200 mg by mouth daily.     . hydrochlorothiazide (HYDRODIURIL) 25 MG tablet TAKE 1 TABLET (25 MG TOTAL) BY MOUTH EVERY MORNING. 90 tablet 3  . LIVALO 2 MG TABS TAKE 1 TABLET BY MOUTH EVERY DAY 90 tablet 1    . metoprolol succinate (TOPROL-XL) 100 MG 24 hr tablet TAKE 1 TABLET (100 MG TOTAL) BY MOUTH EVERY EVENING. TAKE WITH OR IMMEDIATELY FOLLOWING A MEAL. 90 tablet 3  . Multiple Vitamin (MULITIVITAMIN WITH MINERALS) TABS Take 1 tablet by mouth every evening.     Clayton [name] 3-6-9 Fatty Acids (OMEGA-3-6-9 PO) Take 1 capsule by mouth every morning. Omega 3= 800mg ,omega 6=380mg ,omega 9 =300mg     . spironolactone (ALDACTONE) 25 MG tablet TAKE 0.5 TABLETS (12.5 MG TOTAL) BY MOUTH DAILY. 45 tablet 3  . triamcinolone cream (KENALOG) 0.5 % Apply 1 application topically 2 (two) times daily. To affected areas. 30 g 3  . vitamin C (ASCORBIC ACID) 500 MG tablet Take 500 mg by mouth every morning.     . zinc gluconate 50 MG tablet Take 50 mg by mouth every evening.     Marland Kitchen Zn-Pyg Afri-Nettle-Saw Palmet (SAW PALMETTO COMPLEX PO) Take 1 capsule by mouth every morning.      No current facility-administered medications on file prior to visit.     PAST MEDICAL HISTORY: Past Medical History:  Diagnosis Date  . Arthritis    hips   . Atrial fibrillation ([place])   . Cataract   . Dysrhythmia    PAF- hx of 7 years ago   . HLD (hyperlipidemia)   . Hypertension 05/29/10   ECHO-EF 67% wnl  .  Meniere disease   . Obesity   . Palpitations   . Sleep apnea 09/14/04   [facility]- Dr. Humphrey Rolls; CPAP titration 11/12/04- Dr. Humphrey Rolls    PAST SURGICAL HISTORY: Past Surgical History:  Procedure Laterality Date  . CARDIAC CATHETERIZATION  11/03/05  . CATARACT EXTRACTION  2014  . KNEE ARTHROSCOPY     left knee- 2002   . TOTAL HIP ARTHROPLASTY  06/08/2011   Procedure: TOTAL HIP ARTHROPLASTY ANTERIOR APPROACH;  Surgeon: [name], MD;  Location: [facility];  Service: Orthopedics;  Laterality: Left;    SOCIAL HISTORY: Social History   Tobacco Use  . Smoking status: Never Smoker  . Smokeless tobacco: Never Used  Substance Use Topics  . Alcohol use: No  . Drug use: No    FAMILY HISTORY: Family History   Problem Relation Age of Onset  . Hypertension Mother   . Stroke Mother   . Diabetes Mother   . Hyperlipidemia Mother   . Obesity Mother   . Diabetes Sister   . Stroke Maternal Grandmother        49 died   . Heart disease Maternal Grandfather   . Hypertension Maternal Grandfather   . Diabetes Maternal Grandfather   . Arthritis Father   . Heart disease Father   . Heart disease Paternal Grandfather     ROS: Review of Systems  Constitutional: Positive for weight loss.  Gastrointestinal: Negative for nausea and vomiting.  Musculoskeletal:       Negative for muscle weakness.  Endo/Heme/Allergies:       Positive for polyphagia.    PHYSICAL EXAM: Blood pressure 128/69, pulse 61, temperature 98.2 F (36.8 C), temperature source Oral, height 6\' 1"  (1.854 m), weight 263 lb (119.3 kg), SpO2 98 %. Body mass index is 34.7 kg/m. Physical Exam  Constitutional: He is oriented to person, place, and time. He appears well-developed and well-nourished.  Cardiovascular: Normal rate.  Pulmonary/Chest: Effort normal.  Musculoskeletal: Normal range of motion.  Neurological: He is oriented to person, place, and time.  Skin: Skin is warm and dry.  Psychiatric: He has a normal mood and affect. His behavior is normal.  Vitals reviewed.   RECENT LABS AND TESTS: BMET    Component Value Date/Time   NA 140 10/17/2017 0858   K 4.5 10/17/2017 0858   CL 104 10/17/2017 0858   CO2 20 10/17/2017 0858   GLUCOSE 108 (H) 10/17/2017 0858   GLUCOSE 111 (H) 10/12/2016 0902   BUN 25 10/17/2017 0858   CREATININE 1.40 (H) 10/17/2017 0858   CREATININE 1.32 (H) 10/12/2016 0902   CALCIUM 9.5 10/17/2017 0858   GFRNONAA 50 (L) 10/17/2017 0858   GFRNONAA 54 (L) 10/12/2016 0902   GFRAA 58 (L) 10/17/2017 0858   GFRAA 63 10/12/2016 0902   Lab Results  Component Value Date   HGBA1C 6.0 (H) 11/01/2017   HGBA1C 5.4 04/14/2016   HGBA1C 6.0 06/09/2015   Lab Results  Component Value Date   INSULIN 22.4  11/01/2017   CBC    Component Value Date/Time   WBC 5.4 12/08/2015 0945   RBC 4.87 12/08/2015 0945   HGB 15.2 12/08/2015 0945   HCT 44.1 12/08/2015 0945   PLT 194 12/08/2015 0945   MCV 90.6 12/08/2015 0945   MCH 31.2 12/08/2015 0945   MCHC 34.5 12/08/2015 0945   RDW 13.6 12/08/2015 0945   LYMPHSABS 1.5 08/27/2014 1030   MONOABS 0.5 08/27/2014 1030   EOSABS 0.1 08/27/2014 1030   BASOSABS  0.0 08/27/2014 1030   Iron/TIBC/Ferritin/ %Sat No results found for: IRON, TIBC, FERRITIN, IRONPCTSAT Lipid Panel     Component Value Date/Time   CHOL 113 10/17/2017 0858   TRIG 90 10/17/2017 0858   HDL 45 10/17/2017 0858   CHOLHDL 3.8 10/12/2016 0902   VLDL 17 04/14/2016 0937   LDLCALC 50 10/17/2017 0858   LDLCALC 119 (H) 10/12/2016 0902   Hepatic Function Panel     Component Value Date/Time   PROT 6.8 10/17/2017 0858   ALBUMIN 4.6 10/17/2017 0858   AST 36 10/17/2017 0858   ALT 45 (H) 10/17/2017 0858   ALKPHOS 59 10/17/2017 0858   BILITOT 1.1 10/17/2017 0858      Component Value Date/Time   TSH 1.28 12/08/2015 0945   TSH 0.60 06/09/2015 1056   Results for Tucholski, Nthony L "JERRY" (MRN [id number]) as of 11/16/2017 10:29  Ref. Range 11/01/2017 10:52  Vitamin D, 25-Hydroxy Latest Ref Range: 30.0 - 100.0 ng/mL 45.2   ASSESSMENT AND PLAN: Vitamin D deficiency  Prediabetes - Plan: metFORMIN (GLUCOPHAGE) 500 MG tablet  Class 1 obesity with serious comorbidity and body mass index (BMI) of 34.0 to 34.9 in adult, unspecified obesity type  PLAN:  Vitamin D Deficiency Clayton [name] was informed that low vitamin D levels contributes to fatigue and are associated with obesity, breast, and colon cancer. He agrees to continue to take OTC Vit D daily and will follow up for routine testing of vitamin D, at least 2-3 times per year. He was informed of the risk of over-replacement of vitamin D and agrees to not increase his dose unless he discusses this with [name] first. We will recheck labs in 3  months and he will follow up in 2 weeks.  Pre-Diabetes Clayton [name] will continue to work on weight loss, exercise, and decreasing simple carbohydrates in his diet to help decrease the risk of diabetes. We discussed metformin including benefits and risks. He was informed that eating too many simple carbohydrates or too many calories at one sitting increases the likelihood of GI side effects. Clayton [name] agrees to start metformin 500mg  qAM #30 with no refills and a prescription was written today. Clayton [name] agreed to follow up with [name] as directed to monitor his progress.  Obesity Clayton [name] is currently in the action stage of change. As such, his goal is to continue with weight loss efforts. He has agreed to keep a food journal of 1800 calories and 120 grams of protein daily Clayton [name] has been instructed to work up to a goal of 150 minutes of combined cardio and strengthening exercise per week for weight loss and overall health benefits. We discussed the following Behavioral Modification Strategies today: increasing lean protein intake, decreasing simple carbohydrates, increasing vegetables, increase H2O and work on meal planning and easy cooking plans.  Clayton [name] has agreed to follow up with our clinic in 2 weeks. He was informed of the importance of frequent follow up visits to maximize his success with intensive lifestyle modifications for his multiple health conditions.   OBESITY BEHAVIORAL INTERVENTION VISIT  Today's visit was # 2   Starting weight: 266 lbs Starting date: 11/01/17 Today's weight : Weight: 263 lb (119.3 kg)  Today's date: 11/15/2017 Total lbs lost to date: 3 At least 15 minutes were spent on discussing the following behavioral intervention visit.  ASK: We discussed the diagnosis of obesity with Clayton [name] today and Clayton [name] agreed to give [name] permission to discuss obesity behavioral modification therapy today.  ASSESS: Clayton [name] has the diagnosis  of obesity and his BMI today is  34.71. Clayton [name] is in the action stage of change.  ADVISE: Clayton [name] was educated on the multiple health risks of obesity as well as the benefit of weight loss to improve his health. He was advised of the need for long term treatment and the importance of lifestyle modifications to improve his current health and to decrease his risk of future health problems.  AGREE: Multiple dietary modification options and treatment options were discussed and Clayton [name] agreed to follow the recommendations documented in the above note.  ARRANGE: Clayton [name] was educated on the importance of frequent visits to treat obesity as outlined per CMS and USPSTF guidelines and agreed to schedule his next follow up appointment today.  I, Marcille Blanco, am acting as transcriptionist for Starlyn Skeans, MD  I have reviewed the above documentation for accuracy and completeness, and I agree with the above. -Dennard Nip, MD

## 2017-11-18 NOTE — Progress Notes (Signed)
[name] [address], [place] [address] Phone: [phone] Subjective:    I Clayton [name] am serving as a Education administrator for Dr. Hulan Saas.    CC: Hip pain follow-up  SEG:[id number]  Clayton [name] is a 72 y.o. male coming in with complaint of hip pain. States that he is feeling good today.  Patient does have known arthritic changes in the back.  Continues to try to lose weight.  [facility].  Making changes daily activities.  Goal is to get another 40 pounds    Past Medical History:  Diagnosis Date  . Arthritis    hips   . Atrial fibrillation ([place])   . Cataract   . Dysrhythmia    PAF- hx of 7 years ago   . HLD (hyperlipidemia)   . Hypertension 05/29/10   ECHO-EF 67% wnl  . Meniere disease   . Obesity   . Palpitations   . Sleep apnea 09/14/04   [facility]- Dr. Humphrey Rolls; CPAP titration 11/12/04- Dr. Humphrey Rolls   Past Surgical History:  Procedure Laterality Date  . CARDIAC CATHETERIZATION  11/03/05  . CATARACT EXTRACTION  2014  . KNEE ARTHROSCOPY     left knee- 2002   . TOTAL HIP ARTHROPLASTY  06/08/2011   Procedure: TOTAL HIP ARTHROPLASTY ANTERIOR APPROACH;  Surgeon: [name], MD;  Location: [facility];  Service: Orthopedics;  Laterality: Left;   Social History   Socioeconomic History  . Marital status: Married    Spouse name: Clayton [name]  . Number of children: Not on file  . Years of education: Not on file  . Highest education level: Not on file  Occupational History  . Occupation: Retired  Scientific laboratory technician  . Financial resource strain: Not on file  . Food insecurity:    Worry: Not on file    Inability: Not on file  . Transportation needs:    Medical: Not on file    Non-medical: Not on file  Tobacco Use  . Smoking status: Never Smoker  . Smokeless tobacco: Never Used  Substance and Sexual Activity  . Alcohol use: No  . Drug use: No  . Sexual activity: Yes    Birth  control/protection: None  Lifestyle  . Physical activity:    Days per week: Not on file    Minutes per session: Not on file  . Stress: Not on file  Relationships  . Social connections:    Talks on phone: Not on file    Gets together: Not on file    Attends religious service: Not on file    Active member of club or organization: Not on file    Attends meetings of clubs or organizations: Not on file    Relationship status: Not on file  Other Topics Concern  . Not on file  Social History Narrative   Married. Clayton [name].Takes care of his mother-in-law as well.    Retired, Conservator, museum/gallery.    Drinks caffeine beverages. Takes a daily vitamin.   Exercises routinely.    Smoke detector in the home.    Ambulates independently.    Allergies  Allergen Reactions  . Statins    Family History  Problem Relation Age of Onset  . Hypertension Mother   . Stroke Mother   . Diabetes Mother   . Hyperlipidemia Mother   . Obesity Mother   . Diabetes Sister   . Stroke Maternal Grandmother  26 died   . Heart disease Maternal Grandfather   . Hypertension Maternal Grandfather   . Diabetes Maternal Grandfather   . Arthritis Father   . Heart disease Father   . Heart disease Paternal Grandfather     Current Outpatient Medications (Endocrine & Metabolic):  .  metFORMIN (GLUCOPHAGE) 500 MG tablet, Take 1 tablet (500 mg total) by mouth daily with breakfast.  Current Outpatient Medications (Cardiovascular):  .  amLODipine-benazepril (LOTREL) 10-40 MG capsule, TAKE 1 CAPSULE BY MOUTH EVERY DAY IN THE EVENING .  ezetimibe (ZETIA) 10 MG tablet, TAKE 1 TABLET BY MOUTH EVERY DAY .  hydrochlorothiazide (HYDRODIURIL) 25 MG tablet, TAKE 1 TABLET BY MOUTH EVERY DAY IN THE MORNING .  LIVALO 2 MG TABS, TAKE 1 TABLET BY MOUTH EVERY DAY .  metoprolol succinate (TOPROL-XL) 100 MG 24 hr tablet, TAKE 1 TABLET (100 MG TOTAL) BY MOUTH EVERY EVENING. TAKE WITH OR IMMEDIATELY FOLLOWING A MEAL. Marland Kitchen   spironolactone (ALDACTONE) 25 MG tablet, TAKE 1/2 TABLET DAILY   Current Outpatient Medications (Analgesics):  .  aspirin EC 81 MG tablet, Take 1 tablet (81 mg total) by mouth daily.   Current Outpatient Medications (Other):  Marland Kitchen  Boswellia-Glucosamine-Vit D (GLUCOSAMINE COMPLEX PO), Take 1,500 mg by mouth every morning.  Marland Kitchen  co-enzyme Q-10 30 MG capsule, Take 200 mg by mouth daily.  .  Multiple Vitamin (MULITIVITAMIN WITH MINERALS) TABS, Take 1 tablet by mouth every evening.  [name] 3-6-9 Fatty Acids (OMEGA-3-6-9 PO), Take 1 capsule by mouth every morning. Omega 3= 800mg ,omega 6=380mg ,omega 9 =300mg  .  triamcinolone cream (KENALOG) 0.5 %, Apply 1 application topically 2 (two) times daily. To affected areas. .  vitamin C (ASCORBIC ACID) 500 MG tablet, Take 500 mg by mouth every morning.  .  zinc gluconate 50 MG tablet, Take 50 mg by mouth every evening.  Marland Kitchen  Zn-Pyg Afri-Nettle-Saw Palmet (SAW PALMETTO COMPLEX PO), Take 1 capsule by mouth every morning.     Past medical history, social, surgical and family history all reviewed in electronic medical record.  No pertanent information unless stated regarding to the chief complaint.   Review of Systems:  No headache, visual changes, nausea, vomiting, diarrhea, constipation, dizziness, abdominal pain, skin rash, fevers, chills, night sweats, weight loss, swollen lymph nodes, body aches, joint swelling, chest pain, shortness of breath, mood changes.  Positive muscle aches  Objective  Blood pressure 134/80, pulse 63, height 6\' 1"  (1.854 m), weight 265 lb (120.2 kg), SpO2 96 %.    General: No apparent distress alert and oriented x3 mood and affect normal, dressed appropriately.  HEENT: Pupils equal, extraocular movements intact  Respiratory: Patient's speak in full sentences and does not appear short of breath  Cardiovascular: No lower extremity edema, non tender, no erythema  Skin: Warm dry intact with no signs of infection or rash on  extremities or on axial skeleton.  Abdomen: Soft nontender  Neuro: Cranial nerves II through XII are intact, neurovascularly intact in all extremities with 2+ DTRs and 2+ pulses.  Lymph: No lymphadenopathy of posterior or anterior cervical chain or axillae bilaterally.  Gait normal with good balance and coordination.  MSK:  Non tender with full range of motion and good stability and symmetric strength and tone of shoulders, elbows, wrist, hip, knee and ankles bilaterally.  Mild arthritic changes Back Exam:  Inspection: Loss of lordosis Motion: Flexion 35 deg, Extension 25 deg, Side Bending to 35 deg bilaterally, Rotation to 35 deg bilaterally  SLR laying:  Negative  XSLR laying: Negative  Palpable tenderness: Tender to palpation the paraspinal musculature bilaterally in the galea and the sacroiliac. FABER: negative. Sensory change: Gross sensation intact to all lumbar and sacral dermatomes.  Reflexes: 2+ at both patellar tendons, 2+ at achilles tendons, Babinski's downgoing.  Strength at foot  Plantar-flexion: 5/5 Dorsi-flexion: 5/5 Eversion: 5/5 Inversion: 5/5  Leg strength  Quad: 5/5 Hamstring: 5/5 Hip flexor: 5/5 Hip abductors: 5/5  Gait unremarkable.  Osteopathic findings  T9 extended rotated and side bent left L2 flexed rotated and side bent right L4 flexed rotated side bent right Sacrum right on right     Impression and Recommendations:     This case required medical decision making of moderate complexity. The above documentation has been reviewed and is accurate and complete [name], DO       Note: This dictation was prepared with Dragon dictation along with smaller phrase technology. Any transcriptional errors that result from this process are unintentional.

## 2017-11-21 ENCOUNTER — Encounter: Payer: Self-pay | Admitting: Family Medicine

## 2017-11-21 ENCOUNTER — Ambulatory Visit: Payer: Medicare Other | Admitting: Family Medicine

## 2017-11-21 VITALS — BP 134/80 | HR 63 | Ht 73.0 in | Wt 265.0 lb

## 2017-11-21 DIAGNOSIS — M9902 Segmental and somatic dysfunction of thoracic region: Secondary | ICD-10-CM | POA: Diagnosis not present

## 2017-11-21 DIAGNOSIS — M9903 Segmental and somatic dysfunction of lumbar region: Secondary | ICD-10-CM

## 2017-11-21 DIAGNOSIS — M545 Low back pain, unspecified: Secondary | ICD-10-CM

## 2017-11-21 DIAGNOSIS — G8929 Other chronic pain: Secondary | ICD-10-CM

## 2017-11-21 DIAGNOSIS — M999 Biomechanical lesion, unspecified: Secondary | ICD-10-CM

## 2017-11-21 DIAGNOSIS — M9904 Segmental and somatic dysfunction of sacral region: Secondary | ICD-10-CM

## 2017-11-21 NOTE — Assessment & Plan Note (Signed)
Low back pain.  Degenerative disc disease.  Has been doing very well.  Discussed icing regimen and home exercises.  Discussed and encourage patient to continue with the weight loss.  Follow-up again in 3 months

## 2017-11-21 NOTE — Assessment & Plan Note (Signed)
Decision today to treat with OMT was based on Physical Exam  After verbal consent patient was treated with HVLA, ME, FPR techniques in  thoracic, lumbar and sacral areas  Patient tolerated the procedure well with improvement in symptoms  Patient given exercises, stretches and lifestyle modifications  See medications in patient instructions if given  Patient will follow up in 12 weeks 

## 2017-11-21 NOTE — Patient Instructions (Signed)
Good to see you  Ice is your friend  Stay active One knee down one knee up tilt pelvis forward.  Hands overhead then rotate to upper leg.  Hold 10 seconds, relax, repeat and do other side as well.  Very important when after being in a flexed position for a long amount of time.  See me again in 3 months

## 2017-12-01 ENCOUNTER — Ambulatory Visit (INDEPENDENT_AMBULATORY_CARE_PROVIDER_SITE_OTHER): Payer: Medicare Other | Admitting: Family Medicine

## 2017-12-01 VITALS — BP 116/69 | HR 64 | Temp 98.0°F | Ht 73.0 in | Wt 259.0 lb

## 2017-12-01 DIAGNOSIS — R7303 Prediabetes: Secondary | ICD-10-CM

## 2017-12-01 DIAGNOSIS — E669 Obesity, unspecified: Secondary | ICD-10-CM | POA: Diagnosis not present

## 2017-12-01 DIAGNOSIS — Z6834 Body mass index (BMI) 34.0-34.9, adult: Secondary | ICD-10-CM | POA: Diagnosis not present

## 2017-12-01 MED ORDER — METFORMIN HCL 500 MG PO TABS: 500.0000 mg | ORAL_TABLET | Freq: Every day | ORAL | 0 refills | Status: DC

## 2017-12-05 NOTE — Progress Notes (Signed)
Office: [phone]  /  Fax: [phone]   HPI:   Chief Complaint: OBESITY Clayton [name] is here to discuss his progress with his obesity treatment plan. He is on the keep a food journal with 1800 calories and 120 grams of protein daily and is following his eating plan approximately 100 % of the time. He states he is riding stationary bike, lifting weights, and playing golf for 30-35 minutes 3-5 times per week. Clayton [name] continues to do well with weight loss. He is journaling his food and meeting his goals regularly.  His weight is 259 lb (117.5 kg) today and has had a weight loss of 4 pounds over a period of 2 to 3 weeks since his last visit. He has lost 7 lbs since starting treatment with [name].  Pre-Diabetes Clayton [name] has a diagnosis of pre-diabetes based on his elevated Hgb A1c and was informed this puts him at greater risk of developing diabetes. He is stable on metformin and he is doing well with diet. He continues to work on diet and exercise to decrease risk of diabetes. He denies nausea, vomiting, or hypoglycemia.  ALLERGIES: Allergies  Allergen Reactions  . Statins     MEDICATIONS: Current Outpatient Medications on File Prior to Visit  Medication Sig Dispense Refill  . amLODipine-benazepril (LOTREL) 10-40 MG capsule TAKE 1 CAPSULE BY MOUTH EVERY DAY IN THE EVENING 90 capsule 3  . aspirin EC 81 MG tablet Take 1 tablet (81 mg total) by mouth daily. 90 tablet 3  . Boswellia-Glucosamine-Vit D (GLUCOSAMINE COMPLEX PO) Take 1,500 mg by mouth every morning.     Marland Kitchen co-enzyme Q-10 30 MG capsule Take 200 mg by mouth daily.     Marland Kitchen ezetimibe (ZETIA) 10 MG tablet TAKE 1 TABLET BY MOUTH EVERY DAY 90 tablet 3  . hydrochlorothiazide (HYDRODIURIL) 25 MG tablet TAKE 1 TABLET BY MOUTH EVERY DAY IN THE MORNING 90 tablet 3  . LIVALO 2 MG TABS TAKE 1 TABLET BY MOUTH EVERY DAY 90 tablet 3  . metoprolol succinate (TOPROL-XL) 100 MG 24 hr tablet TAKE 1 TABLET (100 MG TOTAL) BY MOUTH EVERY EVENING. TAKE WITH OR  IMMEDIATELY FOLLOWING A MEAL. 90 tablet 3  . Multiple Vitamin (MULITIVITAMIN WITH MINERALS) TABS Take 1 tablet by mouth every evening.     [name] 3-6-9 Fatty Acids (OMEGA-3-6-9 PO) Take 1 capsule by mouth every morning. Omega 3= 800mg ,omega 6=380mg ,omega 9 =300mg     . spironolactone (ALDACTONE) 25 MG tablet TAKE 1/2 TABLET DAILY 45 tablet 3  . triamcinolone cream (KENALOG) 0.5 % Apply 1 application topically 2 (two) times daily. To affected areas. 30 g 3  . vitamin C (ASCORBIC ACID) 500 MG tablet Take 500 mg by mouth every morning.     . zinc gluconate 50 MG tablet Take 50 mg by mouth every evening.     Marland Kitchen Zn-Pyg Afri-Nettle-Saw Palmet (SAW PALMETTO COMPLEX PO) Take 1 capsule by mouth every morning.      No current facility-administered medications on file prior to visit.     PAST MEDICAL HISTORY: Past Medical History:  Diagnosis Date  . Arthritis    hips   . Atrial fibrillation ([place])   . Cataract   . Dysrhythmia    PAF- hx of 7 years ago   . HLD (hyperlipidemia)   . Hypertension 05/29/10   ECHO-EF 67% wnl  . Meniere disease   . Obesity   . Palpitations   . Sleep apnea 09/14/04   [facility]- Dr. Humphrey Rolls;  CPAP titration 11/12/04- Dr. Humphrey Rolls    PAST SURGICAL HISTORY: Past Surgical History:  Procedure Laterality Date  . CARDIAC CATHETERIZATION  11/03/05  . CATARACT EXTRACTION  2014  . KNEE ARTHROSCOPY     left knee- 2002   . TOTAL HIP ARTHROPLASTY  06/08/2011   Procedure: TOTAL HIP ARTHROPLASTY ANTERIOR APPROACH;  Surgeon: [name], MD;  Location: [facility];  Service: Orthopedics;  Laterality: Left;    SOCIAL HISTORY: Social History   Tobacco Use  . Smoking status: Never Smoker  . Smokeless tobacco: Never Used  Substance Use Topics  . Alcohol use: No  . Drug use: No    FAMILY HISTORY: Family History  Problem Relation Age of Onset  . Hypertension Mother   . Stroke Mother   . Diabetes Mother   . Hyperlipidemia Mother   . Obesity Mother   . Diabetes  Sister   . Stroke Maternal Grandmother        11 died   . Heart disease Maternal Grandfather   . Hypertension Maternal Grandfather   . Diabetes Maternal Grandfather   . Arthritis Father   . Heart disease Father   . Heart disease Paternal Grandfather     ROS: Review of Systems  Constitutional: Positive for weight loss.  Gastrointestinal: Negative for nausea and vomiting.  Endo/Heme/Allergies:       Negative hypoglycemia    PHYSICAL EXAM: Blood pressure 116/69, pulse 64, temperature 98 F (36.7 C), temperature source Oral, height 6\' 1"  (1.854 m), weight 259 lb (117.5 kg), SpO2 95 %. Body mass index is 34.17 kg/m. Physical Exam  Constitutional: He is oriented to person, place, and time. He appears well-developed and well-nourished.  Cardiovascular: Normal rate.  Pulmonary/Chest: Effort normal.  Musculoskeletal: Normal range of motion.  Neurological: He is oriented to person, place, and time.  Skin: Skin is warm and dry.  Psychiatric: He has a normal mood and affect. His behavior is normal.  Vitals reviewed.   RECENT LABS AND TESTS: BMET    Component Value Date/Time   NA 140 10/17/2017 0858   K 4.5 10/17/2017 0858   CL 104 10/17/2017 0858   CO2 20 10/17/2017 0858   GLUCOSE 108 (H) 10/17/2017 0858   GLUCOSE 111 (H) 10/12/2016 0902   BUN 25 10/17/2017 0858   CREATININE 1.40 (H) 10/17/2017 0858   CREATININE 1.32 (H) 10/12/2016 0902   CALCIUM 9.5 10/17/2017 0858   GFRNONAA 50 (L) 10/17/2017 0858   GFRNONAA 54 (L) 10/12/2016 0902   GFRAA 58 (L) 10/17/2017 0858   GFRAA 63 10/12/2016 0902   Lab Results  Component Value Date   HGBA1C 6.0 (H) 11/01/2017   HGBA1C 5.4 04/14/2016   HGBA1C 6.0 06/09/2015   Lab Results  Component Value Date   INSULIN 22.4 11/01/2017   CBC    Component Value Date/Time   WBC 5.4 12/08/2015 0945   RBC 4.87 12/08/2015 0945   HGB 15.2 12/08/2015 0945   HCT 44.1 12/08/2015 0945   PLT 194 12/08/2015 0945   MCV 90.6 12/08/2015 0945    MCH 31.2 12/08/2015 0945   MCHC 34.5 12/08/2015 0945   RDW 13.6 12/08/2015 0945   LYMPHSABS 1.5 08/27/2014 1030   MONOABS 0.5 08/27/2014 1030   EOSABS 0.1 08/27/2014 1030   BASOSABS 0.0 08/27/2014 1030   Iron/TIBC/Ferritin/ %Sat No results found for: IRON, TIBC, FERRITIN, IRONPCTSAT Lipid Panel     Component Value Date/Time   CHOL 113 10/17/2017 0858   TRIG 90 10/17/2017 0858  HDL 45 10/17/2017 0858   CHOLHDL 3.8 10/12/2016 0902   VLDL 17 04/14/2016 0937   LDLCALC 50 10/17/2017 0858   LDLCALC 119 (H) 10/12/2016 0902   Hepatic Function Panel     Component Value Date/Time   PROT 6.8 10/17/2017 0858   ALBUMIN 4.6 10/17/2017 0858   AST 36 10/17/2017 0858   ALT 45 (H) 10/17/2017 0858   ALKPHOS 59 10/17/2017 0858   BILITOT 1.1 10/17/2017 0858      Component Value Date/Time   TSH 1.28 12/08/2015 0945   TSH 0.60 06/09/2015 1056    ASSESSMENT AND PLAN: Prediabetes - Plan: metFORMIN (GLUCOPHAGE) 500 MG tablet  Class 1 obesity with serious comorbidity and body mass index (BMI) of 34.0 to 34.9 in adult, unspecified obesity type  PLAN:  Pre-Diabetes Clayton [name] will continue to work on weight loss, exercise, and decreasing simple carbohydrates in his diet to help decrease the risk of diabetes. We dicussed metformin including benefits and risks. He was informed that eating too many simple carbohydrates or too many calories at one sitting increases the likelihood of GI side effects. Clayton [name] agrees to continue taking metformin 500 mg q AM #30 and we will refill for 1 month. Clayton [name] agrees to follow up with our clinic in 3 weeks as directed to monitor his progress.  Obesity Clayton [name] is currently in the action stage of change. As such, his goal is to continue with weight loss efforts He has agreed to keep a food journal with 1800 calories and 120 grams of protein daily Clayton [name] has been instructed to work up to a goal of 150 minutes of combined cardio and strengthening exercise per week for  weight loss and overall health benefits. We discussed the following Behavioral Modification Strategies today: increasing lean protein intake, decreasing simple carbohydrates  and work on meal planning and easy cooking plans   Clayton [name] has agreed to follow up with our clinic in 3 weeks. He was informed of the importance of frequent follow up visits to maximize his success with intensive lifestyle modifications for his multiple health conditions.   OBESITY BEHAVIORAL INTERVENTION VISIT  Today's visit was # 3   Starting weight: 266 lbs Starting date: 11/01/17 Today's weight : 259 lbs Today's date: 12/01/2017 Total lbs lost to date: 7 At least 15 minutes were spent on discussing the following behavioral intervention visit.   ASK: We discussed the diagnosis of obesity with Clayton [name] today and Clayton [name] agreed to give [name] permission to discuss obesity behavioral modification therapy today.  ASSESS: Clayton [name] has the diagnosis of obesity and his BMI today is 34.18 Clayton [name] is in the action stage of change   ADVISE: Clayton [name] was educated on the multiple health risks of obesity as well as the benefit of weight loss to improve his health. He was advised of the need for long term treatment and the importance of lifestyle modifications to improve his current health and to decrease his risk of future health problems.  AGREE: Multiple dietary modification options and treatment options were discussed and  Clayton [name] agreed to follow the recommendations documented in the above note.  ARRANGE: Clayton [name] was educated on the importance of frequent visits to treat obesity as outlined per CMS and USPSTF guidelines and agreed to schedule his next follow up appointment today.  I, Trixie Dredge, am acting as transcriptionist for Dennard Nip, MD  I have reviewed the above documentation for accuracy and completeness, and I agree with the above. -Dennard Nip, MD

## 2017-12-14 ENCOUNTER — Other Ambulatory Visit (INDEPENDENT_AMBULATORY_CARE_PROVIDER_SITE_OTHER): Payer: Self-pay | Admitting: Family Medicine

## 2017-12-14 DIAGNOSIS — R7303 Prediabetes: Secondary | ICD-10-CM

## 2017-12-21 ENCOUNTER — Ambulatory Visit (INDEPENDENT_AMBULATORY_CARE_PROVIDER_SITE_OTHER): Payer: Medicare Other | Admitting: Family Medicine

## 2017-12-21 VITALS — BP 112/63 | HR 55 | Temp 97.4°F | Ht 73.0 in | Wt 255.0 lb

## 2017-12-21 DIAGNOSIS — Z6833 Body mass index (BMI) 33.0-33.9, adult: Secondary | ICD-10-CM | POA: Diagnosis not present

## 2017-12-21 DIAGNOSIS — E669 Obesity, unspecified: Secondary | ICD-10-CM

## 2017-12-21 DIAGNOSIS — R7303 Prediabetes: Secondary | ICD-10-CM | POA: Diagnosis not present

## 2017-12-23 NOTE — Progress Notes (Signed)
Office: [phone]  /  Fax: [phone]   HPI:   Chief Complaint: OBESITY Clayton [name] is here to discuss his progress with his obesity treatment plan. He is on the  keep a food journal with 1800 calories and 120 protein  and is following his eating plan approximately 100 % of the time. He states he is exercising 35 minutes 4 times per week. Clayton [name] continues to do well with weight loss efforts, does like journaling and is doing well with his calories /protein goals. His hunger is controlled.  His weight is 255 lb (115.7 kg) today and has had a weight loss of 4 pounds over a period of 2 weeks since his last visit. He has lost 10 lbs since starting treatment with [name].  Pre-Diabetes Clayton [name] has a diagnosis of prediabetes based on his elevated HgA1c and was informed this puts him at greater risk of developing diabetes. He is taking metformin currently and continues to work on diet and exercise to decrease risk of diabetes. He denies nausea or hypoglycemia or GI upset and is doing well with weight loss.     ALLERGIES: Allergies  Allergen Reactions  . Statins     MEDICATIONS: Current Outpatient Medications on File Prior to Visit  Medication Sig Dispense Refill  . amLODipine-benazepril (LOTREL) 10-40 MG capsule TAKE 1 CAPSULE BY MOUTH EVERY DAY IN THE EVENING 90 capsule 3  . aspirin EC 81 MG tablet Take 1 tablet (81 mg total) by mouth daily. 90 tablet 3  . Boswellia-Glucosamine-Vit D (GLUCOSAMINE COMPLEX PO) Take 1,500 mg by mouth every morning.     Marland Kitchen co-enzyme Q-10 30 MG capsule Take 200 mg by mouth daily.     Marland Kitchen ezetimibe (ZETIA) 10 MG tablet TAKE 1 TABLET BY MOUTH EVERY DAY 90 tablet 3  . hydrochlorothiazide (HYDRODIURIL) 25 MG tablet TAKE 1 TABLET BY MOUTH EVERY DAY IN THE MORNING 90 tablet 3  . LIVALO 2 MG TABS TAKE 1 TABLET BY MOUTH EVERY DAY 90 tablet 3  . metFORMIN (GLUCOPHAGE) 500 MG tablet Take 1 tablet (500 mg total) by mouth daily with breakfast. 30 tablet 0  . metoprolol succinate  (TOPROL-XL) 100 MG 24 hr tablet TAKE 1 TABLET (100 MG TOTAL) BY MOUTH EVERY EVENING. TAKE WITH OR IMMEDIATELY FOLLOWING A MEAL. 90 tablet 3  . Multiple Vitamin (MULITIVITAMIN WITH MINERALS) TABS Take 1 tablet by mouth every evening.     [name] 3-6-9 Fatty Acids (OMEGA-3-6-9 PO) Take 1 capsule by mouth every morning. Omega 3= 800mg ,omega 6=380mg ,omega 9 =300mg     . spironolactone (ALDACTONE) 25 MG tablet TAKE 1/2 TABLET DAILY 45 tablet 3  . triamcinolone cream (KENALOG) 0.5 % Apply 1 application topically 2 (two) times daily. To affected areas. 30 g 3  . vitamin C (ASCORBIC ACID) 500 MG tablet Take 500 mg by mouth every morning.     . zinc gluconate 50 MG tablet Take 50 mg by mouth every evening.     Marland Kitchen Zn-Pyg Afri-Nettle-Saw Palmet (SAW PALMETTO COMPLEX PO) Take 1 capsule by mouth every morning.      No current facility-administered medications on file prior to visit.     PAST MEDICAL HISTORY: Past Medical History:  Diagnosis Date  . Arthritis    hips   . Atrial fibrillation ([place])   . Cataract   . Dysrhythmia    PAF- hx of 7 years ago   . HLD (hyperlipidemia)   . Hypertension 05/29/10   ECHO-EF 67% wnl  . Meniere disease   .  Obesity   . Palpitations   . Sleep apnea 09/14/04   [facility]- Dr. Humphrey Rolls; CPAP titration 11/12/04- Dr. Humphrey Rolls    PAST SURGICAL HISTORY: Past Surgical History:  Procedure Laterality Date  . CARDIAC CATHETERIZATION  11/03/05  . CATARACT EXTRACTION  2014  . KNEE ARTHROSCOPY     left knee- 2002   . TOTAL HIP ARTHROPLASTY  06/08/2011   Procedure: TOTAL HIP ARTHROPLASTY ANTERIOR APPROACH;  Surgeon: [name], MD;  Location: [facility];  Service: Orthopedics;  Laterality: Left;    SOCIAL HISTORY: Social History   Tobacco Use  . Smoking status: Never Smoker  . Smokeless tobacco: Never Used  Substance Use Topics  . Alcohol use: No  . Drug use: No    FAMILY HISTORY: Family History  Problem Relation Age of Onset  . Hypertension Mother     . Stroke Mother   . Diabetes Mother   . Hyperlipidemia Mother   . Obesity Mother   . Diabetes Sister   . Stroke Maternal Grandmother        23 died   . Heart disease Maternal Grandfather   . Hypertension Maternal Grandfather   . Diabetes Maternal Grandfather   . Arthritis Father   . Heart disease Father   . Heart disease Paternal Grandfather     ROS: Review of Systems  Constitutional: Positive for weight loss.    PHYSICAL EXAM: Blood pressure 112/63, pulse (!) 55, temperature (!) 97.4 F (36.3 C), temperature source Oral, height 6\' 1"  (1.854 m), weight 255 lb (115.7 kg), SpO2 96 %. Body mass index is 33.64 kg/m. Physical Exam  Constitutional: He is oriented to person, place, and time. He appears well-developed and well-nourished.  HENT:  Head: Normocephalic.  Eyes: EOM are normal.  Neck: Normal range of motion.  Cardiovascular: Normal rate.  Pulmonary/Chest: Effort normal.  Musculoskeletal: Normal range of motion.  Neurological: He is alert and oriented to person, place, and time.  Skin: Skin is warm.  Psychiatric: He has a normal mood and affect.  Vitals reviewed.   RECENT LABS AND TESTS: BMET    Component Value Date/Time   NA 140 10/17/2017 0858   K 4.5 10/17/2017 0858   CL 104 10/17/2017 0858   CO2 20 10/17/2017 0858   GLUCOSE 108 (H) 10/17/2017 0858   GLUCOSE 111 (H) 10/12/2016 0902   BUN 25 10/17/2017 0858   CREATININE 1.40 (H) 10/17/2017 0858   CREATININE 1.32 (H) 10/12/2016 0902   CALCIUM 9.5 10/17/2017 0858   GFRNONAA 50 (L) 10/17/2017 0858   GFRNONAA 54 (L) 10/12/2016 0902   GFRAA 58 (L) 10/17/2017 0858   GFRAA 63 10/12/2016 0902   Lab Results  Component Value Date   HGBA1C 6.0 (H) 11/01/2017   HGBA1C 5.4 04/14/2016   HGBA1C 6.0 06/09/2015   Lab Results  Component Value Date   INSULIN 22.4 11/01/2017   CBC    Component Value Date/Time   WBC 5.4 12/08/2015 0945   RBC 4.87 12/08/2015 0945   HGB 15.2 12/08/2015 0945   HCT 44.1  12/08/2015 0945   PLT 194 12/08/2015 0945   MCV 90.6 12/08/2015 0945   MCH 31.2 12/08/2015 0945   MCHC 34.5 12/08/2015 0945   RDW 13.6 12/08/2015 0945   LYMPHSABS 1.5 08/27/2014 1030   MONOABS 0.5 08/27/2014 1030   EOSABS 0.1 08/27/2014 1030   BASOSABS 0.0 08/27/2014 1030   Iron/TIBC/Ferritin/ %Sat No results found for: IRON, TIBC, FERRITIN, IRONPCTSAT Lipid Panel  Component Value Date/Time   CHOL 113 10/17/2017 0858   TRIG 90 10/17/2017 0858   HDL 45 10/17/2017 0858   CHOLHDL 3.8 10/12/2016 0902   VLDL 17 04/14/2016 0937   LDLCALC 50 10/17/2017 0858   LDLCALC 119 (H) 10/12/2016 0902   Hepatic Function Panel     Component Value Date/Time   PROT 6.8 10/17/2017 0858   ALBUMIN 4.6 10/17/2017 0858   AST 36 10/17/2017 0858   ALT 45 (H) 10/17/2017 0858   ALKPHOS 59 10/17/2017 0858   BILITOT 1.1 10/17/2017 0858      Component Value Date/Time   TSH 1.28 12/08/2015 0945   TSH 0.60 06/09/2015 1056    ASSESSMENT AND PLAN: Prediabetes  Class 1 obesity with serious comorbidity and body mass index (BMI) of 33.0 to 33.9 in adult, unspecified obesity type  PLAN: Pre-Diabetes Mason will continue to work on weight loss, exercise, and decreasing simple carbohydrates in his diet to help decrease the risk of diabetes. We dicussed metformin including benefits and risks. He was informed that eating too many simple carbohydrates or too many calories at one sitting increases the likelihood of GI side effects. Randon did not need a prescription today for metformin for now and a prescription was not written today. Dyan agreed to follow up with [name] as directed to monitor his progress.  Obesity Bode is currently in the action stage of change. As such, his goal is to continue with weight loss efforts He has agreed to keep a food journal with 1800 calories and 120 protein  Amelio has been instructed to work up to a goal of 150 minutes of combined cardio and strengthening exercise per  week for weight loss and overall health benefits. We discussed the following Behavioral Modification Stratagies today: increasing lean protein intake, decreasing simple carbohydrates , work on meal planning and easy cooking plans and holiday eating strategies   I spent > than 50% of the 15 minute visit on counseling as documented in the note.  Ember has agreed to follow up with our clinic in 2 weeks. He was informed of the importance of frequent follow up visits to maximize his success with intensive lifestyle modifications for his multiple health conditions.   OBESITY BEHAVIORAL INTERVENTION VISIT  Today's visit was # 4   Starting weight: 266 lbs Starting date: 11/01/17 Today's weight : Weight: 255 lb (115.7 kg)  Today's date: 12/22/17 Total lbs lost to date: 10    ASK: We discussed the diagnosis of obesity with [name] today and Awanda Mink agreed to give [name] permission to discuss obesity behavioral modification therapy today.  ASSESS: Lakin has the diagnosis of obesity and his BMI today is 33.7 Jovi is in the action stage of change   ADVISE: Khalon was educated on the multiple health risks of obesity as well as the benefit of weight loss to improve his health. He was advised of the need for long term treatment and the importance of lifestyle modifications to improve his current health and to decrease his risk of future health problems.  AGREE: Multiple dietary modification options and treatment options were discussed and  Orvis agreed to follow the recommendations documented in the above note.  ARRANGE: Merlin was educated on the importance of frequent visits to treat obesity as outlined per CMS and USPSTF guidelines and agreed to schedule his next follow up appointment today.  I, April Moore, am acting as Location manager for Dennard Nip, MD.   I have reviewed the above documentation  for accuracy and completeness, and I agree with the above. -Dennard Nip, MD

## 2017-12-26 ENCOUNTER — Ambulatory Visit (INDEPENDENT_AMBULATORY_CARE_PROVIDER_SITE_OTHER): Payer: Medicare Other | Admitting: Family Medicine

## 2017-12-26 ENCOUNTER — Encounter (INDEPENDENT_AMBULATORY_CARE_PROVIDER_SITE_OTHER): Payer: Self-pay | Admitting: Family Medicine

## 2018-01-04 ENCOUNTER — Ambulatory Visit (INDEPENDENT_AMBULATORY_CARE_PROVIDER_SITE_OTHER): Payer: Medicare Other | Admitting: Family Medicine

## 2018-01-04 ENCOUNTER — Encounter (INDEPENDENT_AMBULATORY_CARE_PROVIDER_SITE_OTHER): Payer: Self-pay | Admitting: Family Medicine

## 2018-01-04 VITALS — BP 124/78 | HR 58 | Temp 97.9°F | Ht 73.0 in | Wt 250.0 lb

## 2018-01-04 DIAGNOSIS — R7303 Prediabetes: Secondary | ICD-10-CM | POA: Diagnosis not present

## 2018-01-04 DIAGNOSIS — E669 Obesity, unspecified: Secondary | ICD-10-CM | POA: Diagnosis not present

## 2018-01-04 DIAGNOSIS — Z6833 Body mass index (BMI) 33.0-33.9, adult: Secondary | ICD-10-CM | POA: Diagnosis not present

## 2018-01-04 MED ORDER — METFORMIN HCL 500 MG PO TABS: 500.0000 mg | ORAL_TABLET | Freq: Every day | ORAL | 0 refills | Status: DC

## 2018-01-05 NOTE — Progress Notes (Signed)
Office: [phone]  /  Fax: [phone]   HPI:   Chief Complaint: OBESITY Clayton [name] is here to discuss his progress with his obesity treatment plan. He is keeping a food journal with 1800 calories and 120 grams of protein and is following his eating plan approximately 100 % of the time. He states he is walking, biking, and weights for  45 minutes 4 to 5 times per week. Clayton [name] continues to do very well with weight loss and is journaling regularly. He is meeting his calorie and macronutrient goals. His hunger is controlled and he feels satisfied.  His weight is 250 lb (113.4 kg) today and has had a weight loss of 5 pounds over a period of 2 weeks since his last visit. He has lost 16 lbs since starting treatment with [name].  Pre-Diabetes Clayton [name] has a diagnosis of pre-diabetes based on his elevated Hgb A1c and was informed this puts him at greater risk of developing diabetes. He is stable on his diet and metformin currently and continues to work on diet and exercise to decrease risk of diabetes. He denies nausea, vomiting, or hypoglycemia.  ASSESSMENT AND PLAN:  Prediabetes - Plan: metFORMIN (GLUCOPHAGE) 500 MG tablet  Class 1 obesity with serious comorbidity and body mass index (BMI) of 33.0 to 33.9 in adult, unspecified obesity type  PLAN:  Pre-Diabetes Clayton [name] will continue to work on weight loss, exercise, and decreasing simple carbohydrates in his diet to help decrease the risk of diabetes. He was informed that eating too many simple carbohydrates or too many calories at one sitting increases the likelihood of GI side effects. Clayton [name] agreed to continue his diet, exercise, and to take metformin 500mg  with breakfast #30 with no refills and a prescription was written today. Clayton [name] agreed to follow up with [name] as directed to monitor his progress in 4 weeks.  Obesity Clayton [name] is currently in the action stage of change. As such, his goal is to continue with weight loss efforts. He has agreed to  keep a food journal with 1800 calories and 120 grams of protein.  Acheron has been instructed to work up to a goal of 150 minutes of combined cardio and strengthening exercise per week for weight loss and overall health benefits. We discussed the following Behavioral Modification Strategies today: increasing lean protein intake, decreasing simple carbohydrates, and work on meal planning and easy cooking plans.  Barnie has agreed to follow up with our clinic in 4 weeks. He was informed of the importance of frequent follow up visits to maximize his success with intensive lifestyle modifications for his multiple health conditions.  ALLERGIES: Allergies  Allergen Reactions  . Statins     MEDICATIONS: Current Outpatient Medications on File Prior to Visit  Medication Sig Dispense Refill  . amLODipine-benazepril (LOTREL) 10-40 MG capsule TAKE 1 CAPSULE BY MOUTH EVERY DAY IN THE EVENING 90 capsule 3  . aspirin EC 81 MG tablet Take 1 tablet (81 mg total) by mouth daily. 90 tablet 3  . Boswellia-Glucosamine-Vit D (GLUCOSAMINE COMPLEX PO) Take 1,500 mg by mouth every morning.     Marland Kitchen co-enzyme Q-10 30 MG capsule Take 200 mg by mouth daily.     Marland Kitchen ezetimibe (ZETIA) 10 MG tablet TAKE 1 TABLET BY MOUTH EVERY DAY 90 tablet 3  . hydrochlorothiazide (HYDRODIURIL) 25 MG tablet TAKE 1 TABLET BY MOUTH EVERY DAY IN THE MORNING 90 tablet 3  . LIVALO 2 MG TABS TAKE 1 TABLET BY MOUTH EVERY DAY 90 tablet 3  .  metoprolol succinate (TOPROL-XL) 100 MG 24 hr tablet TAKE 1 TABLET (100 MG TOTAL) BY MOUTH EVERY EVENING. TAKE WITH OR IMMEDIATELY FOLLOWING A MEAL. 90 tablet 3  . Multiple Vitamin (MULITIVITAMIN WITH MINERALS) TABS Take 1 tablet by mouth every evening.     [name] 3-6-9 Fatty Acids (OMEGA-3-6-9 PO) Take 1 capsule by mouth every morning. Omega 3= 800mg ,omega 6=380mg ,omega 9 =300mg     . spironolactone (ALDACTONE) 25 MG tablet TAKE 1/2 TABLET DAILY 45 tablet 3  . triamcinolone cream (KENALOG) 0.5 % Apply 1  application topically 2 (two) times daily. To affected areas. 30 g 3  . vitamin C (ASCORBIC ACID) 500 MG tablet Take 500 mg by mouth every morning.     . zinc gluconate 50 MG tablet Take 50 mg by mouth every evening.     Marland Kitchen Zn-Pyg Afri-Nettle-Saw Palmet (SAW PALMETTO COMPLEX PO) Take 1 capsule by mouth every morning.      No current facility-administered medications on file prior to visit.     PAST MEDICAL HISTORY: Past Medical History:  Diagnosis Date  . Arthritis    hips   . Atrial fibrillation ([place])   . Cataract   . Dysrhythmia    PAF- hx of 7 years ago   . HLD (hyperlipidemia)   . Hypertension 05/29/10   ECHO-EF 67% wnl  . Meniere disease   . Obesity   . Palpitations   . Sleep apnea 09/14/04   [facility]- Dr. Humphrey Rolls; CPAP titration 11/12/04- Dr. Humphrey Rolls    PAST SURGICAL HISTORY: Past Surgical History:  Procedure Laterality Date  . CARDIAC CATHETERIZATION  11/03/05  . CATARACT EXTRACTION  2014  . KNEE ARTHROSCOPY     left knee- 2002   . TOTAL HIP ARTHROPLASTY  06/08/2011   Procedure: TOTAL HIP ARTHROPLASTY ANTERIOR APPROACH;  Surgeon: [name], MD;  Location: [facility];  Service: Orthopedics;  Laterality: Left;    SOCIAL HISTORY: Social History   Tobacco Use  . Smoking status: Never Smoker  . Smokeless tobacco: Never Used  Substance Use Topics  . Alcohol use: No  . Drug use: No    FAMILY HISTORY: Family History  Problem Relation Age of Onset  . Hypertension Mother   . Stroke Mother   . Diabetes Mother   . Hyperlipidemia Mother   . Obesity Mother   . Diabetes Sister   . Stroke Maternal Grandmother        81 died   . Heart disease Maternal Grandfather   . Hypertension Maternal Grandfather   . Diabetes Maternal Grandfather   . Arthritis Father   . Heart disease Father   . Heart disease Paternal Grandfather    ROS: Review of Systems  Constitutional: Positive for weight loss.  Gastrointestinal: Negative for nausea and vomiting.    Endo/Heme/Allergies:       Negative for hypoglycemia.   PHYSICAL EXAM: Blood pressure 124/78, pulse (!) 58, temperature 97.9 F (36.6 C), temperature source Oral, height 6\' 1"  (1.854 m), weight 250 lb (113.4 kg), SpO2 98 %. Body mass index is 32.98 kg/m. Physical Exam Vitals signs reviewed.  Constitutional:      Appearance: Normal appearance. He is obese.  Cardiovascular:     Rate and Rhythm: Normal rate.  Pulmonary:     Effort: Pulmonary effort is normal.  Musculoskeletal: Normal range of motion.  Skin:    General: Skin is warm and dry.  Neurological:     Mental Status: He is alert and oriented to  person, place, and time.  Psychiatric:        Mood and Affect: Mood normal.        Behavior: Behavior normal.    RECENT LABS AND TESTS: BMET    Component Value Date/Time   NA 140 10/17/2017 0858   K 4.5 10/17/2017 0858   CL 104 10/17/2017 0858   CO2 20 10/17/2017 0858   GLUCOSE 108 (H) 10/17/2017 0858   GLUCOSE 111 (H) 10/12/2016 0902   BUN 25 10/17/2017 0858   CREATININE 1.40 (H) 10/17/2017 0858   CREATININE 1.32 (H) 10/12/2016 0902   CALCIUM 9.5 10/17/2017 0858   GFRNONAA 50 (L) 10/17/2017 0858   GFRNONAA 54 (L) 10/12/2016 0902   GFRAA 58 (L) 10/17/2017 0858   GFRAA 63 10/12/2016 0902   Lab Results  Component Value Date   HGBA1C 6.0 (H) 11/01/2017   HGBA1C 5.4 04/14/2016   HGBA1C 6.0 06/09/2015   Lab Results  Component Value Date   INSULIN 22.4 11/01/2017   CBC    Component Value Date/Time   WBC 5.4 12/08/2015 0945   RBC 4.87 12/08/2015 0945   HGB 15.2 12/08/2015 0945   HCT 44.1 12/08/2015 0945   PLT 194 12/08/2015 0945   MCV 90.6 12/08/2015 0945   MCH 31.2 12/08/2015 0945   MCHC 34.5 12/08/2015 0945   RDW 13.6 12/08/2015 0945   LYMPHSABS 1.5 08/27/2014 1030   MONOABS 0.5 08/27/2014 1030   EOSABS 0.1 08/27/2014 1030   BASOSABS 0.0 08/27/2014 1030   Iron/TIBC/Ferritin/ %Sat No results found for: IRON, TIBC, FERRITIN, IRONPCTSAT Lipid Panel      Component Value Date/Time   CHOL 113 10/17/2017 0858   TRIG 90 10/17/2017 0858   HDL 45 10/17/2017 0858   CHOLHDL 3.8 10/12/2016 0902   VLDL 17 04/14/2016 0937   LDLCALC 50 10/17/2017 0858   LDLCALC 119 (H) 10/12/2016 0902   Hepatic Function Panel     Component Value Date/Time   PROT 6.8 10/17/2017 0858   ALBUMIN 4.6 10/17/2017 0858   AST 36 10/17/2017 0858   ALT 45 (H) 10/17/2017 0858   ALKPHOS 59 10/17/2017 0858   BILITOT 1.1 10/17/2017 0858      Component Value Date/Time   TSH 1.28 12/08/2015 0945   TSH 0.60 06/09/2015 1056   Results for Fabrizio, Duglas L "JERRY" (MRN [id number]) as of 01/05/2018 07:55  Ref. Range 11/01/2017 10:52  Vitamin D, 25-Hydroxy Latest Ref Range: 30.0 - 100.0 ng/mL 45.2    OBESITY BEHAVIORAL INTERVENTION VISIT  Today's visit was #5   Starting weight: 266 lbs Starting date: 11/01/17 Today's weight : Weight: 250 lb (113.4 kg)  Today's date: 01/04/2018 Total lbs lost to date: 16 At least 15 minutes were spent on discussing the following behavioral intervention visit.  ASK: We discussed the diagnosis of obesity with [name] today and Marton agreed to give [name] permission to discuss obesity behavioral modification therapy today.  ASSESS: Yaviel has the diagnosis of obesity and his BMI today is 32.9. Akhil is in the action stage of change.   ADVISE: Micaiah was educated on the multiple health risks of obesity as well as the benefit of weight loss to improve his health. He was advised of the need for long term treatment and the importance of lifestyle modifications to improve his current health and to decrease his risk of future health problems.  AGREE: Multiple dietary modification options and treatment options were discussed and Lucio agreed to follow the recommendations documented in the above  note.  ARRANGE: Yuepheng was educated on the importance of frequent visits to treat obesity as outlined per CMS and USPSTF guidelines and  agreed to schedule his next follow up appointment today.  I, Clayton [name], am acting as transcriptionist for Starlyn Skeans, MD I have reviewed the above documentation for accuracy and completeness, and I agree with the above. -Dennard Nip, MD

## 2018-01-31 ENCOUNTER — Telehealth: Payer: Self-pay | Admitting: Cardiovascular Disease

## 2018-01-31 NOTE — Telephone Encounter (Signed)
Pt c/o BP issue: STAT if pt c/o blurred vision, one-sided weakness or slurred speech  1. What are your last 5 BP readings? 115/91, 115/86,100/76, 99/74-   2. Are you having any other symptoms (ex. Dizziness, headache, blurred vision, passed out)? Dizziness  3. What is your BP issue? Blood Pressure is running low for him- He thinks his medicine needs to be changed

## 2018-01-31 NOTE — Telephone Encounter (Signed)
Spoke with pt. Pt sts that over the last 5 months he has had a wight loss of 30lbs.  As he has continued to lose weight he has noticed that his BP readings have began to run low. He normal BP is around 135/80.  Lately his BP has continued to run low for him, with the lowest pressure 99/70, 100/77 Pt sts that with his BP running that low he is lightheaded and dizzy. He has held his Lotrel for the last 3 days and reports the following readings.  Yesterday 115/86 Today 115/91 Pt sts that he plans on continuing to lose weight and he is about 30lbs away from his goal. He feels poorly when his BP is that low, he wonders if Dr.Kelly may need to make some adjustments in his medication.  Adv pt that Dr.Kelly is working in [employer] today. I will fwd him the update and we will call back with his response. Adv pt to stay hydrated and to contact the office if his systolic drops below 90. Pt agreeable with plan.

## 2018-02-01 ENCOUNTER — Ambulatory Visit (INDEPENDENT_AMBULATORY_CARE_PROVIDER_SITE_OTHER): Payer: Medicare Other | Admitting: Family Medicine

## 2018-02-01 ENCOUNTER — Encounter (INDEPENDENT_AMBULATORY_CARE_PROVIDER_SITE_OTHER): Payer: Self-pay | Admitting: Family Medicine

## 2018-02-01 VITALS — BP 103/76 | HR 52 | Temp 97.7°F | Ht 73.0 in | Wt 246.0 lb

## 2018-02-01 DIAGNOSIS — R7303 Prediabetes: Secondary | ICD-10-CM

## 2018-02-01 DIAGNOSIS — E669 Obesity, unspecified: Secondary | ICD-10-CM

## 2018-02-01 DIAGNOSIS — Z6832 Body mass index (BMI) 32.0-32.9, adult: Secondary | ICD-10-CM

## 2018-02-01 MED ORDER — METFORMIN HCL 500 MG PO TABS: 500.0000 mg | ORAL_TABLET | Freq: Every day | ORAL | 0 refills | Status: DC

## 2018-02-02 NOTE — Telephone Encounter (Signed)
Recommend reducing HCTZ from 25 mg down to 12.5 mg.  Monitor blood pressure and potential leg swelling.  If no edema and blood pressure remains low then discontinue HCTZ altogether.

## 2018-02-03 NOTE — Telephone Encounter (Signed)
Called patient, LVM advising of the changes per Dr.Kelly. Left call back number if questions.

## 2018-02-03 NOTE — Telephone Encounter (Signed)
Called patient, gave medication recommendations. Patient verbalized understanding will call back if any other changes.

## 2018-02-03 NOTE — Telephone Encounter (Signed)
Follow up   Patient is returning call in reference to medication

## 2018-02-06 NOTE — Progress Notes (Signed)
Office: [phone]  /  Fax: [phone]   HPI:   Chief Complaint: OBESITY Clayton [name] is here to discuss his progress with his obesity treatment plan. He is on the keep a food journal with 1800 calories and 120 grams of protein daily and is following his eating plan approximately 100 % of the time. He states he is walking and bike riding for 20-45 minutes 7 times per week. Clayton [name] has done very well with weight loss even over the holidays. He is doing well with the journal and like the freedom this gives him. His hunger is controlled.  His weight is 246 lb (111.6 kg) today and has had a weight loss of 4 pounds over a period of 4 weeks since his last visit. He has lost 20 lbs since starting treatment with [name].  Pre-Diabetes Clayton [name] has a diagnosis of pre-diabetes based on his elevated Hgb A1c and was informed this puts him at greater risk of developing diabetes. He is stable on metformin and denies nausea, vomiting, or hypoglycemia. He is doing very well with diet and exercise to decrease risk of diabetes.   ASSESSMENT AND PLAN:  Prediabetes - Plan: metFORMIN (GLUCOPHAGE) 500 MG tablet  Class 1 obesity with serious comorbidity and body mass index (BMI) of 32.0 to 32.9 in adult, unspecified obesity type  PLAN:  Pre-Diabetes Clayton [name] will continue to work on weight loss, exercise, and decreasing simple carbohydrates in his diet to help decrease the risk of diabetes. We dicussed metformin including benefits and risks. He was informed that eating too many simple carbohydrates or too many calories at one sitting increases the likelihood of GI side effects. Clayton [name] agrees to continue taking metformin 500 mg q AM #30 and we will refill for 1 month. Clayton [name] agrees to follow up with our clinic in 3 to 4 weeks as directed to monitor his progress.  I spent > than 50% of the 25 minute visit on counseling as documented in the note.  Obesity Clayton [name] is currently in the action stage of change. As such, his  goal is to continue with weight loss efforts He has agreed to keep a food journal with 1600-1800 calories and 120+ grams of protein daily Clayton [name] has been instructed to work up to a goal of 150 minutes of combined cardio and strengthening exercise per week for weight loss and overall health benefits. We discussed the following Behavioral Modification Strategies today: increasing lean protein intake, decreasing simple carbohydrates  and work on meal planning and easy cooking plans   Clayton [name] has agreed to follow up with our clinic in 3 to 4 weeks. He was informed of the importance of frequent follow up visits to maximize his success with intensive lifestyle modifications for his multiple health conditions.  ALLERGIES: Allergies  Allergen Reactions  . Statins     MEDICATIONS: Current Outpatient Medications on File Prior to Visit  Medication Sig Dispense Refill  . amLODipine-benazepril (LOTREL) 10-40 MG capsule TAKE 1 CAPSULE BY MOUTH EVERY DAY IN THE EVENING 90 capsule 3  . aspirin EC 81 MG tablet Take 1 tablet (81 mg total) by mouth daily. 90 tablet 3  . Boswellia-Glucosamine-Vit D (GLUCOSAMINE COMPLEX PO) Take 1,500 mg by mouth every morning.     Marland Kitchen co-enzyme Q-10 30 MG capsule Take 200 mg by mouth daily.     Marland Kitchen ezetimibe (ZETIA) 10 MG tablet TAKE 1 TABLET BY MOUTH EVERY DAY 90 tablet 3  . hydrochlorothiazide (HYDRODIURIL) 25 MG tablet TAKE 1 TABLET BY MOUTH EVERY  DAY IN THE MORNING 90 tablet 3  . LIVALO 2 MG TABS TAKE 1 TABLET BY MOUTH EVERY DAY 90 tablet 3  . metoprolol succinate (TOPROL-XL) 100 MG 24 hr tablet TAKE 1 TABLET (100 MG TOTAL) BY MOUTH EVERY EVENING. TAKE WITH OR IMMEDIATELY FOLLOWING A MEAL. 90 tablet 3  . Multiple Vitamin (MULITIVITAMIN WITH MINERALS) TABS Take 1 tablet by mouth every evening.     [name] 3-6-9 Fatty Acids (OMEGA-3-6-9 PO) Take 1 capsule by mouth every morning. Omega 3= 800mg ,omega 6=380mg ,omega 9 =300mg     . spironolactone (ALDACTONE) 25 MG tablet TAKE 1/2  TABLET DAILY 45 tablet 3  . triamcinolone cream (KENALOG) 0.5 % Apply 1 application topically 2 (two) times daily. To affected areas. 30 g 3  . vitamin C (ASCORBIC ACID) 500 MG tablet Take 500 mg by mouth every morning.     . zinc gluconate 50 MG tablet Take 50 mg by mouth every evening.     Marland Kitchen Zn-Pyg Afri-Nettle-Saw Palmet (SAW PALMETTO COMPLEX PO) Take 1 capsule by mouth every morning.      No current facility-administered medications on file prior to visit.     PAST MEDICAL HISTORY: Past Medical History:  Diagnosis Date  . Arthritis    hips   . Atrial fibrillation (Clayton [name])   . Cataract   . Dysrhythmia    PAF- hx of 7 years ago   . HLD (hyperlipidemia)   . Hypertension 05/29/10   ECHO-EF 67% wnl  . Meniere disease   . Obesity   . Palpitations   . Sleep apnea 09/14/04   [facility]- Dr. Humphrey Rolls; CPAP titration 11/12/04- Dr. Humphrey Rolls    PAST SURGICAL HISTORY: Past Surgical History:  Procedure Laterality Date  . CARDIAC CATHETERIZATION  11/03/05  . CATARACT EXTRACTION  2014  . KNEE ARTHROSCOPY     left knee- 2002   . TOTAL HIP ARTHROPLASTY  06/08/2011   Procedure: TOTAL HIP ARTHROPLASTY ANTERIOR APPROACH;  Surgeon: [name], MD;  Location: [facility];  Service: Orthopedics;  Laterality: Left;    SOCIAL HISTORY: Social History   Tobacco Use  . Smoking status: Never Smoker  . Smokeless tobacco: Never Used  Substance Use Topics  . Alcohol use: No  . Drug use: No    FAMILY HISTORY: Family History  Problem Relation Age of Onset  . Hypertension Mother   . Stroke Mother   . Diabetes Mother   . Hyperlipidemia Mother   . Obesity Mother   . Diabetes Sister   . Stroke Maternal Grandmother        28 died   . Heart disease Maternal Grandfather   . Hypertension Maternal Grandfather   . Diabetes Maternal Grandfather   . Arthritis Father   . Heart disease Father   . Heart disease Paternal Grandfather     ROS: Review of Systems  Constitutional: Positive for  weight loss.  Gastrointestinal: Negative for nausea and vomiting.  Endo/Heme/Allergies:       Negative hypoglycemia    PHYSICAL EXAM: Blood pressure 103/76, pulse (!) 52, temperature 97.7 F (36.5 C), temperature source Oral, height 6\' 1"  (1.854 m), weight 246 lb (111.6 kg), SpO2 98 %. Body mass index is 32.46 kg/m. Physical Exam Vitals signs reviewed.  Constitutional:      Appearance: Normal appearance. He is obese.  Cardiovascular:     Rate and Rhythm: Normal rate.     Pulses: Normal pulses.  Pulmonary:     Effort: Pulmonary effort is  normal.     Breath sounds: Normal breath sounds.  Musculoskeletal: Normal range of motion.  Skin:    General: Skin is warm and dry.  Neurological:     Mental Status: He is alert and oriented to person, place, and time.  Psychiatric:        Mood and Affect: Mood normal.        Behavior: Behavior normal.     RECENT LABS AND TESTS: BMET    Component Value Date/Time   NA 140 10/17/2017 0858   K 4.5 10/17/2017 0858   CL 104 10/17/2017 0858   CO2 20 10/17/2017 0858   GLUCOSE 108 (H) 10/17/2017 0858   GLUCOSE 111 (H) 10/12/2016 0902   BUN 25 10/17/2017 0858   CREATININE 1.40 (H) 10/17/2017 0858   CREATININE 1.32 (H) 10/12/2016 0902   CALCIUM 9.5 10/17/2017 0858   GFRNONAA 50 (L) 10/17/2017 0858   GFRNONAA 54 (L) 10/12/2016 0902   GFRAA 58 (L) 10/17/2017 0858   GFRAA 63 10/12/2016 0902   Lab Results  Component Value Date   HGBA1C 6.0 (H) 11/01/2017   HGBA1C 5.4 04/14/2016   HGBA1C 6.0 06/09/2015   Lab Results  Component Value Date   INSULIN 22.4 11/01/2017   CBC    Component Value Date/Time   WBC 5.4 12/08/2015 0945   RBC 4.87 12/08/2015 0945   HGB 15.2 12/08/2015 0945   HCT 44.1 12/08/2015 0945   PLT 194 12/08/2015 0945   MCV 90.6 12/08/2015 0945   MCH 31.2 12/08/2015 0945   MCHC 34.5 12/08/2015 0945   RDW 13.6 12/08/2015 0945   LYMPHSABS 1.5 08/27/2014 1030   MONOABS 0.5 08/27/2014 1030   EOSABS 0.1 08/27/2014 1030    BASOSABS 0.0 08/27/2014 1030   Iron/TIBC/Ferritin/ %Sat No results found for: IRON, TIBC, FERRITIN, IRONPCTSAT Lipid Panel     Component Value Date/Time   CHOL 113 10/17/2017 0858   TRIG 90 10/17/2017 0858   HDL 45 10/17/2017 0858   CHOLHDL 3.8 10/12/2016 0902   VLDL 17 04/14/2016 0937   LDLCALC 50 10/17/2017 0858   LDLCALC 119 (H) 10/12/2016 0902   Hepatic Function Panel     Component Value Date/Time   PROT 6.8 10/17/2017 0858   ALBUMIN 4.6 10/17/2017 0858   AST 36 10/17/2017 0858   ALT 45 (H) 10/17/2017 0858   ALKPHOS 59 10/17/2017 0858   BILITOT 1.1 10/17/2017 0858      Component Value Date/Time   TSH 1.28 12/08/2015 0945   TSH 0.60 06/09/2015 1056      OBESITY BEHAVIORAL INTERVENTION VISIT  Today's visit was # 6   Starting weight: 266 lbs Starting date: 11/01/17 Today's weight : 246 lbs Today's date: 02/01/2018 Total lbs lost to date: 20    ASK: We discussed the diagnosis of obesity with Clayton [name] today and Clayton [name] agreed to give [name] permission to discuss obesity behavioral modification therapy today.  ASSESS: Karion has the diagnosis of obesity and his BMI today is 32.46 Clayton [name] is in the action stage of change   ADVISE: Clayton [name] was educated on the multiple health risks of obesity as well as the benefit of weight loss to improve his health. He was advised of the need for long term treatment and the importance of lifestyle modifications to improve his current health and to decrease his risk of future health problems.  AGREE: Multiple dietary modification options and treatment options were discussed and  Clayton [name] agreed to follow the recommendations documented in the above  note.  ARRANGE: Clayton [name] was educated on the importance of frequent visits to treat obesity as outlined per CMS and USPSTF guidelines and agreed to schedule his next follow up appointment today.  I, Trixie Dredge, am acting as transcriptionist for Dennard Nip, MD  I have reviewed  the above documentation for accuracy and completeness, and I agree with the above. -Dennard Nip, MD

## 2018-02-14 ENCOUNTER — Other Ambulatory Visit: Payer: Self-pay | Admitting: Cardiovascular Disease

## 2018-02-18 ENCOUNTER — Other Ambulatory Visit: Payer: Self-pay | Admitting: Family Medicine

## 2018-02-20 NOTE — Progress Notes (Signed)
[name] [address] [place], [place] [address] Phone: [phone] Subjective:   Clayton [name], am serving as a scribe for Dr. Hulan Saas.   CC: Back pain  BHA:[id number]  Clayton [name] is a 73 y.o. male coming in with complaint of back pain. [name] change in his pain since last visit. Patient states that he has been doing well. Is here for OMT. Patient has had some mild back pain from time to time but nothing severe.  Not stopping her from activities.  Discussed which activities recently which was avoided and has been doing that fairly regularly.  Continues to lose weight and is working with another physician with this.  Happy with the result so far.       Past Medical History:  Diagnosis Date  . Arthritis    hips   . Atrial fibrillation ([place])   . Cataract   . Dysrhythmia    PAF- hx of 7 years ago   . HLD (hyperlipidemia)   . Hypertension 05/29/10   ECHO-EF 67% wnl  . Meniere disease   . Obesity   . Palpitations   . Sleep apnea 09/14/04   [facility]- Dr. Humphrey Rolls; CPAP titration 11/12/04- Dr. Humphrey Rolls   Past Surgical History:  Procedure Laterality Date  . CARDIAC CATHETERIZATION  11/03/05  . CATARACT EXTRACTION  2014  . KNEE ARTHROSCOPY     left knee- 2002   . TOTAL HIP ARTHROPLASTY  06/08/2011   Procedure: TOTAL HIP ARTHROPLASTY ANTERIOR APPROACH;  Surgeon: [name], MD;  Location: [facility];  Service: Orthopedics;  Laterality: Left;   Social History   Socioeconomic History  . Marital status: Married    Spouse name: Rudi Bunyard  . Number of children: Not on file  . Years of education: Not on file  . Highest education level: Not on file  Occupational History  . Occupation: Retired  Scientific laboratory technician  . Financial resource strain: Not on file  . Food insecurity:    Worry: Not on file    Inability: Not on file  . Transportation needs:    Medical: Not on file    Non-medical: Not on file  Tobacco Use  . Smoking  status: Never Smoker  . Smokeless tobacco: Never Used  Substance and Sexual Activity  . Alcohol use: [name]  . Drug use: [name]  . Sexual activity: Yes    Birth control/protection: None  Lifestyle  . Physical activity:    Days per week: Not on file    Minutes per session: Not on file  . Stress: Not on file  Relationships  . Social connections:    Talks on phone: Not on file    Gets together: Not on file    Attends religious service: Not on file    Active member of club or organization: Not on file    Attends meetings of clubs or organizations: Not on file    Relationship status: Not on file  Other Topics Concern  . Not on file  Social History Narrative   Married. [name].Takes care of his mother-in-law as well.    Retired, Conservator, museum/gallery.    Drinks caffeine beverages. Takes a daily vitamin.   Exercises routinely.    Smoke detector in the home.    Ambulates independently.    Allergies  Allergen Reactions  . Statins    Family History  Problem Relation Age of Onset  . Hypertension Mother   .  Stroke Mother   . Diabetes Mother   . Hyperlipidemia Mother   . Obesity Mother   . Diabetes Sister   . Stroke Maternal Grandmother        2 died   . Heart disease Maternal Grandfather   . Hypertension Maternal Grandfather   . Diabetes Maternal Grandfather   . Arthritis Father   . Heart disease Father   . Heart disease Paternal Grandfather     Current Outpatient Medications (Endocrine & Metabolic):  .  metFORMIN (GLUCOPHAGE) 500 MG tablet, Take 1 tablet (500 mg total) by mouth daily with breakfast.  Current Outpatient Medications (Cardiovascular):  .  amLODipine-benazepril (LOTREL) 10-40 MG capsule, TAKE 1 CAPSULE BY MOUTH EVERY DAY IN THE EVENING .  ezetimibe (ZETIA) 10 MG tablet, TAKE 1 TABLET BY MOUTH EVERY DAY .  hydrochlorothiazide (HYDRODIURIL) 25 MG tablet, TAKE 1 TABLET BY MOUTH EVERY DAY IN THE MORNING .  LIVALO 2 MG TABS, TAKE 1 TABLET BY MOUTH EVERY DAY .   metoprolol succinate (TOPROL-XL) 100 MG 24 hr tablet, TAKE 1 TABLET (100 MG TOTAL) BY MOUTH EVERY EVENING. TAKE WITH OR IMMEDIATELY FOLLOWING A MEAL. Marland Kitchen  spironolactone (ALDACTONE) 25 MG tablet, TAKE 1/2 TABLET DAILY   Current Outpatient Medications (Analgesics):  .  aspirin EC 81 MG tablet, Take 1 tablet (81 mg total) by mouth daily.   Current Outpatient Medications (Other):  Marland Kitchen  Boswellia-Glucosamine-Vit D (GLUCOSAMINE COMPLEX PO), Take 1,500 mg by mouth every morning.  Marland Kitchen  co-enzyme Q-10 30 MG capsule, Take 200 mg by mouth daily.  .  Multiple Vitamin (MULITIVITAMIN WITH MINERALS) TABS, Take 1 tablet by mouth every evening.  [name] 3-6-9 Fatty Acids (OMEGA-3-6-9 PO), Take 1 capsule by mouth every morning. Omega 3= 800mg ,omega 6=380mg ,omega 9 =300mg  .  triamcinolone cream (KENALOG) 0.5 %, APPLY 1 APPLICATION TOPICALLY 2 (TWO) TIMES DAILY. TO AFFECTED AREAS. .  vitamin C (ASCORBIC ACID) 500 MG tablet, Take 500 mg by mouth every morning.  .  zinc gluconate 50 MG tablet, Take 50 mg by mouth every evening.  Marland Kitchen  Zn-Pyg Afri-Nettle-Saw Palmet (SAW PALMETTO COMPLEX PO), Take 1 capsule by mouth every morning.     Past medical history, social, surgical and family history all reviewed in electronic medical record.  [name] pertanent information unless stated regarding to the chief complaint.   Review of Systems:  [name] headache, visual changes, nausea, vomiting, diarrhea, constipation, dizziness, abdominal pain, skin rash, fevers, chills, night sweats, weight loss, swollen lymph nodes, body aches, joint swelling, , chest pain, shortness of breath, mood changes.  Positive muscle aches  Objective  Blood pressure 128/80, pulse 74, height 6\' 1"  (1.854 m), weight 250 lb (113.4 kg), SpO2 98 %.     General: [name] apparent distress alert and oriented x3 mood and affect normal, dressed appropriately.  HEENT: Pupils equal, extraocular movements intact  Respiratory: Patient's speak in full sentences and does not  appear short of breath  Cardiovascular: [name] lower extremity edema, non tender, [name] erythema  Skin: Warm dry intact with [name] signs of infection or rash on extremities or on axial skeleton.  Abdomen: Soft nontender  Neuro: Cranial nerves II through XII are intact, neurovascularly intact in all extremities with 2+ DTRs and 2+ pulses.  Lymph: [name] lymphadenopathy of posterior or anterior cervical chain or axillae bilaterally.  Gait normal with good balance and coordination.  MSK:  Non tender with full range of motion and good stability and symmetric strength and tone of shoulders, elbows, wrist,  hip, knee and ankles bilaterally.  Back Exam:  Inspection: Unremarkable  Motion: Flexion 40 deg, Extension 25 deg, Side Bending to 35 deg bilaterally, Rotation to 35 deg bilaterally  SLR laying: Negative  XSLR laying: Negative  Palpable tenderness: Tender to palpation of the paraspinal musculature thoracolumbar and sacral iliac. FABER: And is bilaterally. Sensory change: Gross sensation intact to all lumbar and sacral dermatomes.  Reflexes: 2+ at both patellar tendons, 2+ at achilles tendons, Babinski's downgoing.  Strength at foot  Plantar-flexion: 5/5 Dorsi-flexion: 5/5 Eversion: 5/5 Inversion: 5/5  Leg strength  Quad: 5/5 Hamstring: 5/5 Hip flexor: 5/5 Hip abductors: 5/5    Osteopathic findings  T9 extended rotated and side bent left L2 flexed rotated and side bent right Sacrum right on right    Impression and Recommendations:     This case required medical decision making of moderate complexity. The above documentation has been reviewed and is accurate and complete [name], DO       Note: This dictation was prepared with Dragon dictation along with smaller phrase technology. Any transcriptional errors that result from this process are unintentional.

## 2018-02-21 ENCOUNTER — Ambulatory Visit: Payer: Medicare Other | Admitting: Family Medicine

## 2018-02-21 VITALS — BP 128/80 | HR 74 | Ht 73.0 in | Wt 250.0 lb

## 2018-02-21 DIAGNOSIS — M9903 Segmental and somatic dysfunction of lumbar region: Secondary | ICD-10-CM | POA: Diagnosis not present

## 2018-02-21 DIAGNOSIS — M9902 Segmental and somatic dysfunction of thoracic region: Secondary | ICD-10-CM

## 2018-02-21 DIAGNOSIS — M545 Low back pain: Secondary | ICD-10-CM

## 2018-02-21 DIAGNOSIS — M999 Biomechanical lesion, unspecified: Secondary | ICD-10-CM

## 2018-02-21 DIAGNOSIS — M9904 Segmental and somatic dysfunction of sacral region: Secondary | ICD-10-CM

## 2018-02-21 DIAGNOSIS — G8929 Other chronic pain: Secondary | ICD-10-CM | POA: Diagnosis not present

## 2018-02-21 NOTE — Assessment & Plan Note (Signed)
Decision today to treat with OMT was based on Physical Exam  After verbal consent patient was treated with HVLA, ME, FPR techniques in  thoracic, lumbar and sacral areas  Patient tolerated the procedure well with improvement in symptoms  Patient given exercises, stretches and lifestyle modifications  See medications in patient instructions if given  Patient will follow up in 4 weeks 

## 2018-02-21 NOTE — Assessment & Plan Note (Signed)
Low back pain.  Degenerative disc disease.  Discussed icing regimen and home exercises, discussed posture and ergonomics.  If symptoms patient does well follow-up in 3 months

## 2018-02-21 NOTE — Patient Instructions (Signed)
1-2 weeks.

## 2018-02-28 ENCOUNTER — Encounter: Payer: Self-pay | Admitting: Family Medicine

## 2018-02-28 ENCOUNTER — Ambulatory Visit: Payer: Medicare Other | Admitting: Family Medicine

## 2018-02-28 VITALS — BP 147/65 | HR 81 | Temp 98.1°F | Resp 16 | Ht 73.0 in | Wt 248.5 lb

## 2018-02-28 DIAGNOSIS — M79644 Pain in right finger(s): Secondary | ICD-10-CM

## 2018-02-28 DIAGNOSIS — L03011 Cellulitis of right finger: Secondary | ICD-10-CM | POA: Diagnosis not present

## 2018-02-28 MED ORDER — MUPIROCIN 2 % EX OINT: 1.0000 "application " | TOPICAL_OINTMENT | Freq: Two times a day (BID) | CUTANEOUS | 0 refills | Status: DC

## 2018-02-28 MED ORDER — DOXYCYCLINE HYCLATE 100 MG PO TABS: 100.0000 mg | ORAL_TABLET | Freq: Two times a day (BID) | ORAL | 0 refills | Status: DC

## 2018-02-28 NOTE — Progress Notes (Signed)
Clayton [name] , 01-18-1946, 73 y.o., male MRN: [id number] Patient Care Team    Relationship Specialty Notifications Start End  Ma Hillock, DO PCP - General Family Medicine  06/09/15   [name], MD Consulting Physician Cardiology  06/09/15   Rana Snare, MD Consulting Physician Urology  06/09/15   Warden Fillers, MD Consulting Physician Ophthalmology  06/09/15   Sharyne Peach, MD Consulting Physician Ophthalmology  06/09/15   Sydnee Levans, MD Consulting Physician Dermatology  06/09/15   Vicie Mutters, MD Consulting Physician Otolaryngology  06/09/15   Paralee Cancel, MD Consulting Physician Orthopedic Surgery  06/09/15     Chief Complaint  Patient presents with  . Hand Pain    R pinky finger swelling and redness, x10 days      Subjective: Pt presents for an OV with complaints of right 5th finger redness and swelling of 10 days duration.  Associated symptoms include mild pain, that is improving after he has been soaking in warm water. He denies fever, chills, or drainage from finger. He denies injury.   Depression screen [facility] 2/9 11/01/2017 06/09/2015  Decreased Interest 1 0  Down, Depressed, Hopeless 0 0  PHQ - 2 Score 1 0  Altered sleeping 3 -  Tired, decreased energy 1 -  Change in appetite 0 -  Feeling bad or failure about yourself  0 -  Trouble concentrating 0 -  Moving slowly or fidgety/restless 0 -  Suicidal thoughts 0 -  PHQ-9 Score 5 -  Difficult doing work/chores Not difficult at all -    Allergies  Allergen Reactions  . Statins    Social History   Social History Narrative   Married. [name].Takes care of his mother-in-law as well.    Retired, Conservator, museum/gallery.    Drinks caffeine beverages. Takes a daily vitamin.   Exercises routinely.    Smoke detector in the home.    Ambulates independently.    Past Medical History:  Diagnosis Date  . Arthritis    hips   . Atrial fibrillation ([place])   . Cataract   . Dysrhythmia    PAF- hx of 7  years ago   . HLD (hyperlipidemia)   . Hypertension 05/29/10   ECHO-EF 67% wnl  . Meniere disease   . Obesity   . Palpitations   . Sleep apnea 09/14/04   [facility]- Dr. Humphrey Rolls; CPAP titration 11/12/04- Dr. Humphrey Rolls   Past Surgical History:  Procedure Laterality Date  . CARDIAC CATHETERIZATION  11/03/05  . CATARACT EXTRACTION  2014  . KNEE ARTHROSCOPY     left knee- 2002   . TOTAL HIP ARTHROPLASTY  06/08/2011   Procedure: TOTAL HIP ARTHROPLASTY ANTERIOR APPROACH;  Surgeon: [name], MD;  Location: [facility];  Service: Orthopedics;  Laterality: Left;   Family History  Problem Relation Age of Onset  . Hypertension Mother   . Stroke Mother   . Diabetes Mother   . Hyperlipidemia Mother   . Obesity Mother   . Diabetes Sister   . Stroke Maternal Grandmother        30 died   . Heart disease Maternal Grandfather   . Hypertension Maternal Grandfather   . Diabetes Maternal Grandfather   . Arthritis Father   . Heart disease Father   . Heart disease Paternal Grandfather    Allergies as of 02/28/2018      Reactions   Statins       Medication List  Accurate as of February 28, 2018  2:42 PM. Always use your most recent med list.        amLODipine-benazepril 10-40 MG capsule Commonly known as:  LOTREL TAKE 1 CAPSULE BY MOUTH EVERY DAY IN THE EVENING   aspirin EC 81 MG tablet Take 1 tablet (81 mg total) by mouth daily.   co-enzyme Q-10 30 MG capsule Take 200 mg by mouth daily.   ezetimibe 10 MG tablet Commonly known as:  ZETIA TAKE 1 TABLET BY MOUTH EVERY DAY   GLUCOSAMINE COMPLEX PO Take 1,500 mg by mouth every morning.   hydrochlorothiazide 25 MG tablet Commonly known as:  HYDRODIURIL TAKE 1 TABLET BY MOUTH EVERY DAY IN THE MORNING   LIVALO 2 MG Tabs Generic drug:  Pitavastatin Calcium TAKE 1 TABLET BY MOUTH EVERY DAY   metFORMIN 500 MG tablet Commonly known as:  GLUCOPHAGE Take 1 tablet (500 mg total) by mouth daily with breakfast.    metoprolol succinate 100 MG 24 hr tablet Commonly known as:  TOPROL-XL TAKE 1 TABLET (100 MG TOTAL) BY MOUTH EVERY EVENING. TAKE WITH OR IMMEDIATELY FOLLOWING A MEAL.   multivitamin with minerals Tabs tablet Take 1 tablet by mouth every evening.   OMEGA-3-6-9 PO Take 1 capsule by mouth every morning. Omega 3= 800mg ,omega 6=380mg ,omega 9 =300mg    SAW PALMETTO COMPLEX PO Take 1 capsule by mouth every morning.   spironolactone 25 MG tablet Commonly known as:  ALDACTONE TAKE 1/2 TABLET DAILY   triamcinolone cream 0.5 % Commonly known as:  KENALOG APPLY 1 APPLICATION TOPICALLY 2 (TWO) TIMES DAILY. TO AFFECTED AREAS.   vitamin C 500 MG tablet Commonly known as:  ASCORBIC ACID Take 500 mg by mouth every morning.   zinc gluconate 50 MG tablet Take 50 mg by mouth every evening.       All past medical history, surgical history, allergies, family history, immunizations andmedications were updated in the EMR today and reviewed under the history and medication portions of their EMR.     ROS: Negative, with the exception of above mentioned in HPI   Objective:  BP (!) 147/65 (BP Location: Right Arm, Patient Position: Sitting, Cuff Size: Normal)   Pulse 81   Temp 98.1 F (36.7 C) (Oral)   Resp 16   Ht 6\' 1"  (1.854 m)   Wt 248 lb 8 oz (112.7 kg)   SpO2 96%   BMI 32.79 kg/m  Body mass index is 32.79 kg/m. Gen: Afebrile. No acute distress. Nontoxic in appearance, well developed, well nourished.  HENT: AT. [place].  MMM MSK: right 5th finger with erythema, swelling posterior nail bed.No abscess formation. Full range of motion. NV intact distally.   No exam data present No results found. No results found for this or any previous visit (from the past 24 hour(s)).  Assessment/Plan: Clayton [name] is a 73 y.o. male present for OV for  Finger pain, right/Paronychia of finger, right - epson salt and warm water soaks 2-3x a day.  - Bactroban cream ointment 2x a day after soaks.  -  Doxycyline every 12 hours for 7 days.  - f/u 2 weeks if not improving.    Reviewed expectations re: course of current medical issues.  Discussed self-management of symptoms.  Outlined signs and symptoms indicating need for more acute intervention.  Patient verbalized understanding and all questions were answered.  Patient received an After-Visit Summary.    No orders of the defined types were placed in this encounter.  > 25  minutes spent with patient, >50% of time spent face to face     Note is dictated utilizing voice recognition software. Although note has been proof read prior to signing, occasional typographical errors still can be missed. If any questions arise, please do not hesitate to call for verification.   electronically signed by:  Howard Pouch, DO  [place]

## 2018-02-28 NOTE — Patient Instructions (Addendum)
-   epson salt and warm water soaks 2-3x a day.  - Bactroban cream ointment 2x a day after soaks.  - Doxycyline every 12 hours for 7 days.    Paronychia Paronychia is an infection of the skin. It happens near a fingernail or toenail. It may cause pain and swelling around the nail. In some cases, a fluid-filled bump (abscess) can form near or under the nail. Usually, this condition is not serious, and it clears up with treatment. Follow these instructions at home: Wound care  Keep the affected area clean.  Soak the fingers or toes in warm water as told by your doctor. You may be told to do this for 20 minutes, 2-3 times a day.  Keep the area dry when you are not soaking it.  Do not try to drain a fluid-filled bump on your own.  Follow instructions from your doctor about how to take care of the affected area. Make sure you: ? Wash your hands with soap and water before you change your bandage (dressing). If you cannot use soap and water, use hand sanitizer. ? Change your bandage as told by your doctor.  If you had a fluid-filled bump and your doctor drained it, check the area every day for signs of infection. Check for: ? Redness, swelling, or pain. ? Fluid or blood. ? Warmth. ? Pus or a bad smell. Medicines   Take over-the-counter and prescription medicines only as told by your doctor.  If you were prescribed an antibiotic medicine, take it as told by your doctor. Do not stop taking it even if you start to feel better. General instructions  Avoid touching any chemicals.  Do not pick at the affected area. Prevention  To prevent this condition from happening again: ? Wear rubber gloves when putting your hands in water for washing dishes or other tasks. ? Wear gloves if your hands might touch cleaners or chemicals. ? Avoid injuring your nails or fingertips. ? Do not bite your nails or tear hangnails. ? Do not cut your nails very short. ? Do not cut the skin at the base and  sides of the nail (cuticles). ? Use clean nail clippers or scissors when trimming nails. Contact a doctor if:  You feel worse.  You do not get better.  You have more fluid, blood, or pus coming from the affected area.  Your finger or knuckle is swollen or is hard to move. Get help right away if you have:  A fever or chills.  Redness spreading from the affected area.  Pain in a joint or muscle. Summary  Paronychia is an infection of the skin. It happens near a fingernail or toenail.  This condition may cause pain and swelling around the nail.  Soak the fingers or toes in warm water as told by your doctor.  Usually, this condition is not serious, and it clears up with treatment. This information is not intended to replace advice given to you by your health care provider. Make sure you discuss any questions you have with your health care provider. Document Released: 12/23/2008 Document Revised: 01/17/2017 Document Reviewed: 01/17/2017 Elsevier Interactive Patient Education  2019 [employer].

## 2018-03-01 ENCOUNTER — Encounter: Payer: Self-pay | Admitting: Family Medicine

## 2018-03-01 ENCOUNTER — Encounter (INDEPENDENT_AMBULATORY_CARE_PROVIDER_SITE_OTHER): Payer: Self-pay

## 2018-03-02 ENCOUNTER — Ambulatory Visit (INDEPENDENT_AMBULATORY_CARE_PROVIDER_SITE_OTHER): Payer: Medicare Other | Admitting: Family Medicine

## 2018-03-02 ENCOUNTER — Encounter (INDEPENDENT_AMBULATORY_CARE_PROVIDER_SITE_OTHER): Payer: Self-pay

## 2018-03-20 ENCOUNTER — Ambulatory Visit: Payer: Medicare Other | Admitting: Family Medicine

## 2018-03-22 ENCOUNTER — Ambulatory Visit (INDEPENDENT_AMBULATORY_CARE_PROVIDER_SITE_OTHER): Payer: Medicare Other | Admitting: Family Medicine

## 2018-03-27 ENCOUNTER — Telehealth (INDEPENDENT_AMBULATORY_CARE_PROVIDER_SITE_OTHER): Payer: Self-pay | Admitting: Family Medicine

## 2018-03-27 NOTE — Telephone Encounter (Signed)
Patient states [facility], [place] [address] has contacted him stating they have tried to reach [name] 3x's regarding his refill of Metformin.  He states he will be in Thursday for a f/u visit but has been out of Metformin for a week now.  Please advise. Thank you

## 2018-03-29 ENCOUNTER — Other Ambulatory Visit: Payer: Self-pay | Admitting: Family Medicine

## 2018-03-29 NOTE — Telephone Encounter (Signed)
Called pt and LM for him to return in regards to RX refill

## 2018-03-30 ENCOUNTER — Ambulatory Visit (INDEPENDENT_AMBULATORY_CARE_PROVIDER_SITE_OTHER): Payer: Medicare Other | Admitting: Family Medicine

## 2018-03-30 ENCOUNTER — Encounter (INDEPENDENT_AMBULATORY_CARE_PROVIDER_SITE_OTHER): Payer: Self-pay | Admitting: Family Medicine

## 2018-03-30 ENCOUNTER — Other Ambulatory Visit: Payer: Self-pay

## 2018-03-30 VITALS — BP 121/69 | HR 71 | Ht 73.0 in | Wt 243.0 lb

## 2018-03-30 DIAGNOSIS — R7303 Prediabetes: Secondary | ICD-10-CM

## 2018-03-30 DIAGNOSIS — I10 Essential (primary) hypertension: Secondary | ICD-10-CM

## 2018-03-30 DIAGNOSIS — Z6832 Body mass index (BMI) 32.0-32.9, adult: Secondary | ICD-10-CM | POA: Diagnosis not present

## 2018-03-30 DIAGNOSIS — E669 Obesity, unspecified: Secondary | ICD-10-CM | POA: Diagnosis not present

## 2018-03-30 MED ORDER — METFORMIN HCL 500 MG PO TABS: 500.0000 mg | ORAL_TABLET | Freq: Every day | ORAL | 0 refills | Status: DC

## 2018-03-30 NOTE — Progress Notes (Signed)
Office: [phone]  /  Fax: [phone]   HPI:   Chief Complaint: OBESITY Clayton [name] is here to discuss his progress with his obesity treatment plan. He is on the keep a food journal with 1600-1800 calories and 120+ grams of protein daily and is following his eating plan approximately 100 % of the time. He states he is walks 3 miles and bike rides for 60-90 minutes 6 times per week. Clayton [name] continue to do well even with going on vacation. His hunger is controlled and he does well with increasing vegetables and increasing lean protein.  His weight is 243 lb (110.2 kg) today and has had a weight loss of 3 pounds over a period of 8 weeks since his last visit. He has lost 23 lbs since starting treatment with [name].  Pre-Diabetes Clayton [name] has a diagnosis of pre-diabetes based on his elevated Hgb A1c and was informed this puts him at greater risk of developing diabetes. He is stable on metformin and denies nausea, vomiting, or hypoglycemia. He continues to work on diet and exercise to decrease risk of diabetes.   Hypertension Clayton [name] is a 73 y.o. male with hypertension. Clayton [name]'s blood pressure was getting low and he discontinued hydrochlorothiazide, and his blood pressure is more at goal. He denies chest pain or dizziness. He is working on weight loss to help control his blood pressure with the goal of decreasing his risk of heart attack and stroke. Clayton [name]'s blood pressure is currently controlled.  ASSESSMENT AND PLAN:  Prediabetes - Plan: metFORMIN (GLUCOPHAGE) 500 MG tablet  Essential hypertension  Class 1 obesity with serious comorbidity and body mass index (BMI) of 32.0 to 32.9 in adult, unspecified obesity type  PLAN:  Pre-Diabetes Clayton [name] will continue to work on weight loss, exercise, and decreasing simple carbohydrates in his diet to help decrease the risk of diabetes. We dicussed metformin including benefits and risks. He was informed that eating too many simple carbohydrates or  too many calories at one sitting increases the likelihood of GI side effects. Clayton [name] agrees to continue taking metformin 500 mg q AM #90 with no refills. Clayton [name] agrees to follow up with our clinic in 4 weeks as directed to monitor his progress.  Hypertension We discussed sodium restriction, working on healthy weight loss, and a regular exercise program as the means to achieve improved blood pressure control. Clayton [name] agreed with this plan and agreed to follow up as directed. We will continue to monitor his blood pressure as well as his progress with the above lifestyle modifications. Clayton [name] agrees to continue his current medications as is and will watch for signs of hypotension as he continues his lifestyle modifications. Clayton [name] agrees to follow up with our clinic in 4 weeks.  Obesity Clayton [name] is currently in the action stage of change. As such, his goal is to continue with weight loss efforts He has agreed to keep a food journal with 1600-1800 calories and 120+ grams of protein daily Clayton [name] has been instructed to work up to a goal of 150 minutes of combined cardio and strengthening exercise per week for weight loss and overall health benefits. We discussed the following Behavioral Modification Strategies today: increasing lean protein intake, increase H20 intake, decrease eating out, better snacking choices, and travel eating strategies    Clayton [name] has agreed to follow up with our clinic in 4 weeks. He was informed of the importance of frequent follow up visits to maximize his success with intensive lifestyle modifications for his multiple health conditions.  ALLERGIES: Allergies  Allergen Reactions  . Statins     MEDICATIONS: Current Outpatient Medications on File Prior to Visit  Medication Sig Dispense Refill  . amLODipine-benazepril (LOTREL) 10-40 MG capsule TAKE 1 CAPSULE BY MOUTH EVERY DAY IN THE EVENING 90 capsule 3  . aspirin EC 81 MG tablet Take 1 tablet (81 mg total) by mouth daily. 90  tablet 3  . [place]-Glucosamine-Vit D (GLUCOSAMINE COMPLEX PO) Take 1,500 mg by mouth every morning.     Marland Kitchen co-enzyme Q-10 30 MG capsule Take 200 mg by mouth daily.     Marland Kitchen ezetimibe (ZETIA) 10 MG tablet TAKE 1 TABLET BY MOUTH EVERY DAY 90 tablet 3  . hydrochlorothiazide (HYDRODIURIL) 25 MG tablet TAKE 1 TABLET BY MOUTH EVERY DAY IN THE MORNING 90 tablet 3  . LIVALO 2 MG TABS TAKE 1 TABLET BY MOUTH EVERY DAY 90 tablet 3  . metoprolol succinate (TOPROL-XL) 100 MG 24 hr tablet TAKE 1 TABLET (100 MG TOTAL) BY MOUTH EVERY EVENING. TAKE WITH OR IMMEDIATELY FOLLOWING A MEAL. 90 tablet 3  . Multiple Vitamin (MULITIVITAMIN WITH MINERALS) TABS Take 1 tablet by mouth every evening.     . mupirocin ointment (BACTROBAN) 2 % Place 1 application into the nose 2 (two) times daily. 22 g 0  . Omega 3-6-9 Fatty Acids (OMEGA-3-6-9 PO) Take 1 capsule by mouth every morning. Omega 3= 800mg ,omega 6=380mg ,omega 9 =300mg     . spironolactone (ALDACTONE) 25 MG tablet TAKE 1/2 TABLET DAILY 45 tablet 3  . triamcinolone cream (KENALOG) 0.5 % APPLY 1 APPLICATION TOPICALLY 2 (TWO) TIMES DAILY. TO AFFECTED AREAS. 30 g 3  . vitamin C (ASCORBIC ACID) 500 MG tablet Take 500 mg by mouth every morning.     . zinc gluconate 50 MG tablet Take 50 mg by mouth every evening.     Marland Kitchen Zn-Pyg Afri-Nettle-Saw Palmet (SAW PALMETTO COMPLEX PO) Take 1 capsule by mouth every morning.      No current facility-administered medications on file prior to visit.     PAST MEDICAL HISTORY: Past Medical History:  Diagnosis Date  . Arthritis    hips   . Atrial fibrillation ([place])   . Cataract   . Dysrhythmia    PAF- hx of 7 years ago   . HLD (hyperlipidemia)   . Hypertension 05/29/10   ECHO-EF 67% wnl  . Meniere disease   . Obesity   . Palpitations   . Sleep apnea 09/14/04   [name]- Dr. Humphrey Rolls; CPAP titration 11/12/04- Dr. Humphrey Rolls    PAST SURGICAL HISTORY: Past Surgical History:  Procedure Laterality Date  . CARDIAC  CATHETERIZATION  11/03/05  . CATARACT EXTRACTION  2014  . KNEE ARTHROSCOPY     left knee- 2002   . TOTAL HIP ARTHROPLASTY  06/08/2011   Procedure: TOTAL HIP ARTHROPLASTY ANTERIOR APPROACH;  Surgeon: [name], MD;  Location: [facility];  Service: Orthopedics;  Laterality: Left;    SOCIAL HISTORY: Social History   Tobacco Use  . Smoking status: Never Smoker  . Smokeless tobacco: Never Used  Substance Use Topics  . Alcohol use: No  . Drug use: No    FAMILY HISTORY: Family History  Problem Relation Age of Onset  . Hypertension Mother   . Stroke Mother   . Diabetes Mother   . Hyperlipidemia Mother   . Obesity Mother   . Diabetes Sister   . Stroke Maternal Grandmother        80 died   . Heart  disease Maternal Grandfather   . Hypertension Maternal Grandfather   . Diabetes Maternal Grandfather   . Arthritis Father   . Heart disease Father   . Heart disease Paternal Grandfather     ROS: Review of Systems  Constitutional: Positive for weight loss.  Cardiovascular: Negative for chest pain.  Gastrointestinal: Negative for nausea and vomiting.  Neurological: Negative for dizziness.  Endo/Heme/Allergies:       Negative hypoglycemia    PHYSICAL EXAM: Blood pressure 121/69, pulse 71, height 6\' 1"  (1.854 m), weight 243 lb (110.2 kg), SpO2 97 %. Body mass index is 32.06 kg/m. Physical Exam Vitals signs reviewed.  Constitutional:      Appearance: Normal appearance. He is obese.  Cardiovascular:     Rate and Rhythm: Normal rate.     Pulses: Normal pulses.  Pulmonary:     Effort: Pulmonary effort is normal.     Breath sounds: Normal breath sounds.  Musculoskeletal: Normal range of motion.  Skin:    General: Skin is warm and dry.  Neurological:     Mental Status: He is alert and oriented to person, place, and time.  Psychiatric:        Mood and Affect: Mood normal.        Behavior: Behavior normal.     RECENT LABS AND TESTS: BMET    Component Value Date/Time    NA 140 10/17/2017 0858   K 4.5 10/17/2017 0858   CL 104 10/17/2017 0858   CO2 20 10/17/2017 0858   GLUCOSE 108 (H) 10/17/2017 0858   GLUCOSE 111 (H) 10/12/2016 0902   BUN 25 10/17/2017 0858   CREATININE 1.40 (H) 10/17/2017 0858   CREATININE 1.32 (H) 10/12/2016 0902   CALCIUM 9.5 10/17/2017 0858   GFRNONAA 50 (L) 10/17/2017 0858   GFRNONAA 54 (L) 10/12/2016 0902   GFRAA 58 (L) 10/17/2017 0858   GFRAA 63 10/12/2016 0902   Lab Results  Component Value Date   HGBA1C 6.0 (H) 11/01/2017   HGBA1C 5.4 04/14/2016   HGBA1C 6.0 06/09/2015   Lab Results  Component Value Date   INSULIN 22.4 11/01/2017   CBC    Component Value Date/Time   WBC 5.4 12/08/2015 0945   RBC 4.87 12/08/2015 0945   HGB 15.2 12/08/2015 0945   HCT 44.1 12/08/2015 0945   PLT 194 12/08/2015 0945   MCV 90.6 12/08/2015 0945   MCH 31.2 12/08/2015 0945   MCHC 34.5 12/08/2015 0945   RDW 13.6 12/08/2015 0945   LYMPHSABS 1.5 08/27/2014 1030   MONOABS 0.5 08/27/2014 1030   EOSABS 0.1 08/27/2014 1030   BASOSABS 0.0 08/27/2014 1030   Iron/TIBC/Ferritin/ %Sat No results found for: IRON, TIBC, FERRITIN, IRONPCTSAT Lipid Panel     Component Value Date/Time   CHOL 113 10/17/2017 0858   TRIG 90 10/17/2017 0858   HDL 45 10/17/2017 0858   CHOLHDL 3.8 10/12/2016 0902   VLDL 17 04/14/2016 0937   LDLCALC 50 10/17/2017 0858   LDLCALC 119 (H) 10/12/2016 0902   Hepatic Function Panel     Component Value Date/Time   PROT 6.8 10/17/2017 0858   ALBUMIN 4.6 10/17/2017 0858   AST 36 10/17/2017 0858   ALT 45 (H) 10/17/2017 0858   ALKPHOS 59 10/17/2017 0858   BILITOT 1.1 10/17/2017 0858      Component Value Date/Time   TSH 1.28 12/08/2015 0945   TSH 0.60 06/09/2015 1056      OBESITY BEHAVIORAL INTERVENTION VISIT  Today's visit was # 7  Starting weight: 266 lbs Starting date: 11/01/17 Today's weight : 243 lbs Today's date: 03/30/2018 Total lbs lost to date: 23 At least 15 minutes were spent on discussing the  following behavioral intervention visit.    03/30/2018  Height 6\' 1"  (1.854 m)  Weight 243 lb (110.2 kg)  BMI (Calculated) 32.07  BLOOD PRESSURE - SYSTOLIC 939  BLOOD PRESSURE - DIASTOLIC 69   Body Fat % 03.0 %  Total Body Water (lbs) 117.4 lbs    ASK: We discussed the diagnosis of obesity with Clayton [name] today and Clayton [name] agreed to give [name] permission to discuss obesity behavioral modification therapy today.  ASSESS: Tarique has the diagnosis of obesity and his BMI today is 32.07 Garth is in the action stage of change   ADVISE: Clayton [name] was educated on the multiple health risks of obesity as well as the benefit of weight loss to improve his health. He was advised of the need for long term treatment and the importance of lifestyle modifications to improve his current health and to decrease his risk of future health problems.  AGREE: Multiple dietary modification options and treatment options were discussed and  Lennard agreed to follow the recommendations documented in the above note.  ARRANGE: Keian was educated on the importance of frequent visits to treat obesity as outlined per CMS and USPSTF guidelines and agreed to schedule his next follow up appointment today.  I, Trixie Dredge, am acting as transcriptionist for Dennard Nip, MD  I have reviewed the above documentation for accuracy and completeness, and I agree with the above. -Dennard Nip, MD

## 2018-03-31 NOTE — Telephone Encounter (Signed)
SW pt, he stated that the his finger healed up but he recently injured it while cutting a potato and was using the ointment since he had it on hand. He is now out and was just checking to see if he could get a refill. He stated that he has been using neosporin in the meantime. He stated that his finger is doing well but figured the would ask about a refill. I advised pt to continue using the neosporin and keep the area clean. Pt voiced understanding and was very pleased that we called to check on him.

## 2018-04-12 ENCOUNTER — Encounter (INDEPENDENT_AMBULATORY_CARE_PROVIDER_SITE_OTHER): Payer: Self-pay

## 2018-04-27 ENCOUNTER — Ambulatory Visit (INDEPENDENT_AMBULATORY_CARE_PROVIDER_SITE_OTHER): Payer: Medicare Other | Admitting: Family Medicine

## 2018-05-31 ENCOUNTER — Encounter: Payer: Self-pay | Admitting: Family Medicine

## 2018-05-31 ENCOUNTER — Other Ambulatory Visit: Payer: Self-pay

## 2018-05-31 ENCOUNTER — Ambulatory Visit: Payer: Medicare Other | Admitting: Family Medicine

## 2018-05-31 VITALS — BP 120/84 | HR 61 | Ht 73.0 in | Wt 247.0 lb

## 2018-05-31 DIAGNOSIS — M545 Low back pain, unspecified: Secondary | ICD-10-CM

## 2018-05-31 DIAGNOSIS — M999 Biomechanical lesion, unspecified: Secondary | ICD-10-CM | POA: Diagnosis not present

## 2018-05-31 DIAGNOSIS — M9904 Segmental and somatic dysfunction of sacral region: Secondary | ICD-10-CM

## 2018-05-31 DIAGNOSIS — M9903 Segmental and somatic dysfunction of lumbar region: Secondary | ICD-10-CM

## 2018-05-31 DIAGNOSIS — G8929 Other chronic pain: Secondary | ICD-10-CM

## 2018-05-31 DIAGNOSIS — M9902 Segmental and somatic dysfunction of thoracic region: Secondary | ICD-10-CM | POA: Diagnosis not present

## 2018-05-31 NOTE — Progress Notes (Signed)
[name] [place], [place] [address] Phone: [phone] Subjective:   I Clayton [name] am serving as a Education administrator for Dr. Hulan Saas.   CC: back pain follow-up  [id number]  DELVECCHIO [name] is a 73 y.o. male coming in with complaint of back pain. Back is doing well.  There has degenerative disc disease.  Responding well to manipulation.  Lasting greater than 12 weeks ago.  Has been playing golf on a regular basis but noticed some mild increase in discomfort in the lower back recently.  No radiation down the legs.  Seems to stay localized.  Continues to work on [employer]    Past Medical History:  Diagnosis Date  . Arthritis    hips   . Atrial fibrillation ([place])   . Cataract   . Dysrhythmia    PAF- hx of 7 years ago   . HLD (hyperlipidemia)   . Hypertension 05/29/10   ECHO-EF 67% wnl  . Meniere disease   . Obesity   . Palpitations   . Sleep apnea 09/14/04   [facility]- Dr. Humphrey Rolls; CPAP titration 11/12/04- Dr. Humphrey Rolls   Past Surgical History:  Procedure Laterality Date  . CARDIAC CATHETERIZATION  11/03/05  . CATARACT EXTRACTION  2014  . KNEE ARTHROSCOPY     left knee- 2002   . TOTAL HIP ARTHROPLASTY  06/08/2011   Procedure: TOTAL HIP ARTHROPLASTY ANTERIOR APPROACH;  Surgeon: [name], MD;  Location: [facility];  Service: Orthopedics;  Laterality: Left;   Social History   Socioeconomic History  . Marital status: Married    Spouse name: Clayton [name]  . Number of children: Not on file  . Years of education: Not on file  . Highest education level: Not on file  Occupational History  . Occupation: Retired  Scientific laboratory technician  . Financial resource strain: Not on file  . Food insecurity:    Worry: Not on file    Inability: Not on file  . Transportation needs:    Medical: Not on file    Non-medical: Not on file  Tobacco Use  . Smoking status: Never Smoker  . Smokeless tobacco: Never Used  Substance and Sexual Activity   . Alcohol use: No  . Drug use: No  . Sexual activity: Yes    Birth control/protection: None  Lifestyle  . Physical activity:    Days per week: Not on file    Minutes per session: Not on file  . Stress: Not on file  Relationships  . Social connections:    Talks on phone: Not on file    Gets together: Not on file    Attends religious service: Not on file    Active member of club or organization: Not on file    Attends meetings of clubs or organizations: Not on file    Relationship status: Not on file  Other Topics Concern  . Not on file  Social History Narrative   Married. Clayton [name].Takes care of his mother-in-law as well.    Retired, Conservator, museum/gallery.    Drinks caffeine beverages. Takes a daily vitamin.   Exercises routinely.    Smoke detector in the home.    Ambulates independently.    Allergies  Allergen Reactions  . Statins    Family History  Problem Relation Age of Onset  . Hypertension Mother   . Stroke Mother   . Diabetes Mother   . Hyperlipidemia Mother   . Obesity Mother   .  Diabetes Sister   . Stroke Maternal Grandmother        24 died   . Heart disease Maternal Grandfather   . Hypertension Maternal Grandfather   . Diabetes Maternal Grandfather   . Arthritis Father   . Heart disease Father   . Heart disease Paternal Grandfather     Current Outpatient Medications (Endocrine & Metabolic):  .  metFORMIN (GLUCOPHAGE) 500 MG tablet, Take 1 tablet (500 mg total) by mouth daily with breakfast.  Current Outpatient Medications (Cardiovascular):  .  amLODipine-benazepril (LOTREL) 10-40 MG capsule, TAKE 1 CAPSULE BY MOUTH EVERY DAY IN THE EVENING .  ezetimibe (ZETIA) 10 MG tablet, TAKE 1 TABLET BY MOUTH EVERY DAY .  hydrochlorothiazide (HYDRODIURIL) 25 MG tablet, TAKE 1 TABLET BY MOUTH EVERY DAY IN THE MORNING .  LIVALO 2 MG TABS, TAKE 1 TABLET BY MOUTH EVERY DAY .  metoprolol succinate (TOPROL-XL) 100 MG 24 hr tablet, TAKE 1 TABLET (100 MG TOTAL) BY MOUTH  EVERY EVENING. TAKE WITH OR IMMEDIATELY FOLLOWING A MEAL. Marland Kitchen  spironolactone (ALDACTONE) 25 MG tablet, TAKE 1/2 TABLET DAILY   Current Outpatient Medications (Analgesics):  .  aspirin EC 81 MG tablet, Take 1 tablet (81 mg total) by mouth daily.   Current Outpatient Medications (Other):  Marland Kitchen  Boswellia-Glucosamine-Vit D (GLUCOSAMINE COMPLEX PO), Take 1,500 mg by mouth every morning.  Marland Kitchen  co-enzyme Q-10 30 MG capsule, Take 200 mg by mouth daily.  .  Multiple Vitamin (MULITIVITAMIN WITH MINERALS) TABS, Take 1 tablet by mouth every evening.  .  mupirocin ointment (BACTROBAN) 2 %, Place 1 application into the nose 2 (two) times daily. [facility] [name] 3-6-9 Fatty Acids (OMEGA-3-6-9 PO), Take 1 capsule by mouth every morning. Omega 3= 800mg ,omega 6=380mg ,omega 9 =300mg  .  triamcinolone cream (KENALOG) 0.5 %, APPLY 1 APPLICATION TOPICALLY 2 (TWO) TIMES DAILY. TO AFFECTED AREAS. .  vitamin C (ASCORBIC ACID) 500 MG tablet, Take 500 mg by mouth every morning.  .  zinc gluconate 50 MG tablet, Take 50 mg by mouth every evening.  Marland Kitchen  Zn-Pyg Afri-Nettle-Saw Palmet (SAW PALMETTO COMPLEX PO), Take 1 capsule by mouth every morning.     Past medical history, social, surgical and family history all reviewed in electronic medical record.  No pertanent information unless stated regarding to the chief complaint.   Review of Systems:  No headache, visual changes, nausea, vomiting, diarrhea, constipation, dizziness, abdominal pain, skin rash, fevers, chills, night sweats, weight loss, swollen lymph nodes, body aches, joint swelling, muscle aches, chest pain, shortness of breath, mood changes.   Objective  Blood pressure 120/84, pulse 61, height 6\' 1"  (1.854 m), weight 247 lb (112 kg), SpO2 97 %.   General: No apparent distress alert and oriented x3 mood and affect normal, dressed appropriately.  HEENT: Pupils equal, extraocular movements intact  Respiratory: Patient's speak in full sentences and does not appear short of  breath  Cardiovascular: No lower extremity edema, non tender, no erythema  Skin: Warm dry intact with no signs of infection or rash on extremities or on axial skeleton.  Abdomen: Soft nontender  Neuro: Cranial nerves II through XII are intact, neurovascularly intact in all extremities with 2+ DTRs and 2+ pulses.  Lymph: No lymphadenopathy of posterior or anterior cervical chain or axillae bilaterally.  Gait normal with good balance and coordination.  MSK:  Non tender with full range of motion and good stability and symmetric strength and tone of shoulders, elbows, wrist, hip, knee and ankles bilaterally.  Back  Exam:  Inspection: Loss of lordosis Motion: Flexion 25 deg, Extension 35 deg, Side Bending to 35 deg bilaterally,  Rotation to 35 deg bilaterally  SLR laying: Negative  XSLR laying: Negative  Palpable tenderness: Tender to palpation of paraspinal musculature. FABER: Positive on the right. Sensory change: Gross sensation intact to all lumbar and sacral dermatomes.  Reflexes: 2+ at both patellar tendons, 2+ at achilles tendons, Babinski's downgoing.  Strength at foot  Plantar-flexion: 5/5 Dorsi-flexion: 5/5 Eversion: 5/5 Inversion: 5/5  Leg strength  Quad: 5/5 Hamstring: 5/5 Hip flexor: 5/5 Hip abductors: 5/5    Osteopathic findings  T9 extended rotated and side bent right L3 flexed rotated and side bent left  Sacrum right on right    Impression and Recommendations:     This case required medical decision making of moderate complexity. The above documentation has been reviewed and is accurate and complete [name], DO       Note: This dictation was prepared with Dragon dictation along with smaller phrase technology. Any transcriptional errors that result from this process are unintentional.

## 2018-05-31 NOTE — Assessment & Plan Note (Signed)
Decision today to treat with OMT was based on Physical Exam  After verbal consent patient was treated with HVLA, ME, FPR techniques in  thoracic, lumbar and sacral areas  Patient tolerated the procedure well with improvement in symptoms  Patient given exercises, stretches and lifestyle modifications  See medications in patient instructions if given  Patient will follow up in 12 weeks

## 2018-05-31 NOTE — Assessment & Plan Note (Signed)
Patient is doing remarkably well overall.  We discussed icing regimen and home exercise.  Discussed which activities of doing which wants to avoid.

## 2018-06-16 ENCOUNTER — Other Ambulatory Visit: Payer: Self-pay | Admitting: Family Medicine

## 2018-06-16 ENCOUNTER — Other Ambulatory Visit (INDEPENDENT_AMBULATORY_CARE_PROVIDER_SITE_OTHER): Payer: Self-pay | Admitting: Family Medicine

## 2018-06-16 DIAGNOSIS — R7303 Prediabetes: Secondary | ICD-10-CM

## 2018-06-26 ENCOUNTER — Ambulatory Visit (INDEPENDENT_AMBULATORY_CARE_PROVIDER_SITE_OTHER): Payer: Medicare Other | Admitting: Family Medicine

## 2018-06-26 ENCOUNTER — Other Ambulatory Visit: Payer: Self-pay

## 2018-06-26 ENCOUNTER — Encounter (INDEPENDENT_AMBULATORY_CARE_PROVIDER_SITE_OTHER): Payer: Self-pay | Admitting: Family Medicine

## 2018-06-26 DIAGNOSIS — E669 Obesity, unspecified: Secondary | ICD-10-CM | POA: Diagnosis not present

## 2018-06-26 DIAGNOSIS — Z6832 Body mass index (BMI) 32.0-32.9, adult: Secondary | ICD-10-CM

## 2018-06-26 DIAGNOSIS — R7303 Prediabetes: Secondary | ICD-10-CM | POA: Diagnosis not present

## 2018-06-26 DIAGNOSIS — I10 Essential (primary) hypertension: Secondary | ICD-10-CM

## 2018-06-27 NOTE — Progress Notes (Signed)
Office: [phone]  /  Fax: [phone] TeleHealth Visit:  Clayton [name] has verbally consented to this TeleHealth visit today. The patient is located at home, the provider is located at the [facility] office. The participants in this visit include the listed provider and patient and any and all parties involved. The visit was conducted today via telephone. Clayton [name] was unable to use realtime audiovisual technology today (FaceTime failed) and the telehealth visit was conducted via telephone.  HPI:   Chief Complaint: OBESITY Clayton [name] is here to discuss his progress with his obesity treatment plan. He is on the keep a food journal with 1600 to 1800 calories and 120+ grams of protein daily plan and is following his eating plan approximately 100 % of the time. He states he is walking 2 to 4 miles daily. Clayton [name]'s last visit was three months ago and he states he is doing well with maintaining his weight. He is still journaling daily and he is concerned that he hasn't lost further. He is exercising We were unable to weigh the patient today for this TeleHealth visit. He feels as if he has maintained weight since his last visit.   Pre-Diabetes Clayton [name] has a diagnosis of prediabetes based on his elevated Hgb A1c and was informed this puts him at greater risk of developing diabetes. His last A1c was done over six months ago and was at 6.0 He is taking metformin currently and continues to work on diet and exercise to decrease risk of diabetes. He denies nausea or hypoglycemia.  Hypertension Clayton [name] is a 73 y.o. male with hypertension. He states his blood pressure is averaging 105/70 on medications and he has started feeling a bit lightheaded. He is working weight loss to help control his blood pressure with the goal of decreasing his risk of heart attack and stroke. Clayton [name] blood pressure is currently controlled.  ASSESSMENT AND PLAN:  Prediabetes - Plan: metFORMIN (GLUCOPHAGE)  500 MG tablet  Essential hypertension  Class 1 obesity with serious comorbidity and body mass index (BMI) of 32.0 to 32.9 in adult, unspecified obesity type  PLAN:  Pre-Diabetes Clayton [name] will continue to work on weight loss, exercise, and decreasing simple carbohydrates in his diet to help decrease the risk of diabetes. We dicussed metformin including benefits and risks. He was informed that eating too many simple carbohydrates or too many calories at one sitting increases the likelihood of GI side effects. We will recheck labs when patient is back in the office. Chance agrees to continue metformin 500 mg with breakfast daily #30 with no refills and follow up with [name] as directed to monitor his progress.  Hypertension We discussed sodium restriction, working on healthy weight loss, and a regular exercise program as the means to achieve improved blood pressure control. Clayton [name] agreed with this plan and agreed to follow up as directed. We will continue to monitor his blood pressure as well as his progress with the above lifestyle modifications. He will discontinue HCTZ. He will watch for signs of hypotension as he continues his lifestyle modifications.  I spent > than 50% of the 25 minute visit on counseling as documented in the note.  Obesity Clayton [name] is currently in the action stage of change. As such, his goal is to continue with weight loss efforts He has agreed to keep a food journal with 1600 to 1800 calories and 120 grams of protein daily Clayton [name] has been instructed to work up to a goal of 150 minutes of  combined cardio and strengthening exercise per week for weight loss and overall health benefits. We discussed the following Behavioral Modification Strategies today: increase H2O intake, increasing lean protein intake, increasing vegetables and work on meal planning and easy cooking plans  Clayton [name] has agreed to follow up with our clinic in 2 weeks. He was informed of the importance of frequent  follow up visits to maximize his success with intensive lifestyle modifications for his multiple health conditions.  ALLERGIES: Allergies  Allergen Reactions  . Statins     MEDICATIONS: Current Outpatient Medications on File Prior to Visit  Medication Sig Dispense Refill  . amLODipine-benazepril (LOTREL) 10-40 MG capsule TAKE 1 CAPSULE BY MOUTH EVERY DAY IN THE EVENING 90 capsule 3  . aspirin EC 81 MG tablet Take 1 tablet (81 mg total) by mouth daily. 90 tablet 3  . Boswellia-Glucosamine-Vit D (GLUCOSAMINE COMPLEX PO) Take 1,500 mg by mouth every morning.     Marland Kitchen co-enzyme Q-10 30 MG capsule Take 200 mg by mouth daily.     Marland Kitchen ezetimibe (ZETIA) 10 MG tablet TAKE 1 TABLET BY MOUTH EVERY DAY 90 tablet 3  . LIVALO 2 MG TABS TAKE 1 TABLET BY MOUTH EVERY DAY 90 tablet 3  . metFORMIN (GLUCOPHAGE) 500 MG tablet Take 1 tablet (500 mg total) by mouth daily with breakfast. 90 tablet 0  . metoprolol succinate (TOPROL-XL) 100 MG 24 hr tablet TAKE 1 TABLET (100 MG TOTAL) BY MOUTH EVERY EVENING. TAKE WITH OR IMMEDIATELY FOLLOWING A MEAL. 90 tablet 3  . Multiple Vitamin (MULITIVITAMIN WITH MINERALS) TABS Take 1 tablet by mouth every evening.     . mupirocin ointment (BACTROBAN) 2 % Place 1 application into the nose 2 (two) times daily. 22 g 0  . Omega 3-6-9 Fatty Acids (OMEGA-3-6-9 PO) Take 1 capsule by mouth every morning. Omega 3= 800mg ,omega 6=380mg ,omega 9 =300mg     . spironolactone (ALDACTONE) 25 MG tablet TAKE 1/2 TABLET DAILY 45 tablet 3  . triamcinolone cream (KENALOG) 0.5 % APPLY 1 APPLICATION TOPICALLY 2 (TWO) TIMES DAILY. TO AFFECTED AREAS. 30 g 3  . vitamin C (ASCORBIC ACID) 500 MG tablet Take 500 mg by mouth every morning.     . zinc gluconate 50 MG tablet Take 50 mg by mouth every evening.     Marland Kitchen Zn-Pyg Afri-Nettle-Saw Palmet (SAW PALMETTO COMPLEX PO) Take 1 capsule by mouth every morning.      No current facility-administered medications on file prior to visit.     PAST MEDICAL  HISTORY: Past Medical History:  Diagnosis Date  . Arthritis    hips   . Atrial fibrillation ([place])   . Cataract   . Dysrhythmia    PAF- hx of 7 years ago   . HLD (hyperlipidemia)   . Hypertension 05/29/10   ECHO-EF 67% wnl  . Meniere disease   . Obesity   . Palpitations   . Sleep apnea 09/14/04   [facility]- Dr. Humphrey Rolls; CPAP titration 11/12/04- Dr. Humphrey Rolls    PAST SURGICAL HISTORY: Past Surgical History:  Procedure Laterality Date  . CARDIAC CATHETERIZATION  11/03/05  . CATARACT EXTRACTION  2014  . KNEE ARTHROSCOPY     left knee- 2002   . TOTAL HIP ARTHROPLASTY  06/08/2011   Procedure: TOTAL HIP ARTHROPLASTY ANTERIOR APPROACH;  Surgeon: [name], MD;  Location: [facility];  Service: Orthopedics;  Laterality: Left;    SOCIAL HISTORY: Social History   Tobacco Use  . Smoking status: Never Smoker  .  Smokeless tobacco: Never Used  Substance Use Topics  . Alcohol use: No  . Drug use: No    FAMILY HISTORY: Family History  Problem Relation Age of Onset  . Hypertension Mother   . Stroke Mother   . Diabetes Mother   . Hyperlipidemia Mother   . Obesity Mother   . Diabetes Sister   . Stroke Maternal Grandmother        64 died   . Heart disease Maternal Grandfather   . Hypertension Maternal Grandfather   . Diabetes Maternal Grandfather   . Arthritis Father   . Heart disease Father   . Heart disease Paternal Grandfather     ROS: Review of Systems  Constitutional: Negative for weight loss.  Gastrointestinal: Negative for nausea.  Neurological:       Positive for lightheadedness  Endo/Heme/Allergies:       Negative for hypoglycemia    PHYSICAL EXAM: Pt in no acute distress  RECENT LABS AND TESTS: BMET    Component Value Date/Time   NA 140 10/17/2017 0858   K 4.5 10/17/2017 0858   CL 104 10/17/2017 0858   CO2 20 10/17/2017 0858   GLUCOSE 108 (H) 10/17/2017 0858   GLUCOSE 111 (H) 10/12/2016 0902   BUN 25 10/17/2017 0858   CREATININE 1.40  (H) 10/17/2017 0858   CREATININE 1.32 (H) 10/12/2016 0902   CALCIUM 9.5 10/17/2017 0858   GFRNONAA 50 (L) 10/17/2017 0858   GFRNONAA 54 (L) 10/12/2016 0902   GFRAA 58 (L) 10/17/2017 0858   GFRAA 63 10/12/2016 0902   Lab Results  Component Value Date   HGBA1C 6.0 (H) 11/01/2017   HGBA1C 5.4 04/14/2016   HGBA1C 6.0 06/09/2015   Lab Results  Component Value Date   INSULIN 22.4 11/01/2017   CBC    Component Value Date/Time   WBC 5.4 12/08/2015 0945   RBC 4.87 12/08/2015 0945   HGB 15.2 12/08/2015 0945   HCT 44.1 12/08/2015 0945   PLT 194 12/08/2015 0945   MCV 90.6 12/08/2015 0945   MCH 31.2 12/08/2015 0945   MCHC 34.5 12/08/2015 0945   RDW 13.6 12/08/2015 0945   LYMPHSABS 1.5 08/27/2014 1030   MONOABS 0.5 08/27/2014 1030   EOSABS 0.1 08/27/2014 1030   BASOSABS 0.0 08/27/2014 1030   Iron/TIBC/Ferritin/ %Sat No results found for: IRON, TIBC, FERRITIN, IRONPCTSAT Lipid Panel     Component Value Date/Time   CHOL 113 10/17/2017 0858   TRIG 90 10/17/2017 0858   HDL 45 10/17/2017 0858   CHOLHDL 3.8 10/12/2016 0902   VLDL 17 04/14/2016 0937   LDLCALC 50 10/17/2017 0858   LDLCALC 119 (H) 10/12/2016 0902   Hepatic Function Panel     Component Value Date/Time   PROT 6.8 10/17/2017 0858   ALBUMIN 4.6 10/17/2017 0858   AST 36 10/17/2017 0858   ALT 45 (H) 10/17/2017 0858   ALKPHOS 59 10/17/2017 0858   BILITOT 1.1 10/17/2017 0858      Component Value Date/Time   TSH 1.28 12/08/2015 0945   TSH 0.60 06/09/2015 1056     Ref. Range 11/01/2017 10:52  Vitamin D, 25-Hydroxy Latest Ref Range: 30.0 - 100.0 ng/mL 45.2    I, Doreene Nest, am acting as Location manager for Dennard Nip, MD I have reviewed the above documentation for accuracy and completeness, and I agree with the above. -Dennard Nip, MD

## 2018-06-29 MED ORDER — METFORMIN HCL 500 MG PO TABS: 500.0000 mg | ORAL_TABLET | Freq: Every day | ORAL | 0 refills | Status: DC

## 2018-07-10 ENCOUNTER — Ambulatory Visit (INDEPENDENT_AMBULATORY_CARE_PROVIDER_SITE_OTHER): Payer: Medicare Other | Admitting: Family Medicine

## 2018-07-10 ENCOUNTER — Other Ambulatory Visit: Payer: Self-pay

## 2018-07-10 ENCOUNTER — Encounter (INDEPENDENT_AMBULATORY_CARE_PROVIDER_SITE_OTHER): Payer: Self-pay | Admitting: Family Medicine

## 2018-07-10 DIAGNOSIS — I10 Essential (primary) hypertension: Secondary | ICD-10-CM

## 2018-07-10 DIAGNOSIS — R7303 Prediabetes: Secondary | ICD-10-CM | POA: Diagnosis not present

## 2018-07-10 DIAGNOSIS — E669 Obesity, unspecified: Secondary | ICD-10-CM | POA: Diagnosis not present

## 2018-07-10 DIAGNOSIS — Z6832 Body mass index (BMI) 32.0-32.9, adult: Secondary | ICD-10-CM | POA: Diagnosis not present

## 2018-07-10 MED ORDER — METFORMIN HCL 500 MG PO TABS: 500.0000 mg | ORAL_TABLET | Freq: Every day | ORAL | 0 refills | Status: DC

## 2018-07-10 NOTE — Progress Notes (Signed)
Office: [phone]  /  Fax: [phone] TeleHealth Visit:  Clayton [name] has verbally consented to this TeleHealth visit today. The patient is located at home, the provider is located at the [employer] [facility] office. The participants in this visit include the listed provider and patient. Clayton [name] was unable to use realtime audiovisual technology today and the telehealth visit was conducted via telephone.   HPI:   Chief Complaint: OBESITY Clayton [name] is here to discuss his progress with his obesity treatment plan. He is on the  keep a food journal with 1800 calories and 120g of protein  and is following his eating plan approximately 90 % of the time. He states he is exercising by walking 3.5 miles 2-3 times per week. Clayton [name] weighed at 242 lbs today and has done well maintaining his weight. He has increased his exercise to include strength and cardio. He feels well overall but wonders why e is no longer losing weight.  We were unable to weigh the patient today for this TeleHealth visit. He feels as if he has maintained weight since his last visit. He has lost 23 lbs since starting treatment with [name].  Pre-Diabetes Clayton [name] has a diagnosis of prediabetes based on his elevated HgA1c and was informed this puts him at greater risk of developing diabetes. He is taking metformin currently and continues to work on diet and exercise to decrease risk of diabetes. He denies nausea or hypoglycemia. He notes decreased polyphagia on Metformin.   Hypertension Clayton [name] is a 73 y.o. male with hypertension.  Clayton [name] denies chest pain or shortness of breath on exertion. He is working weight loss to help control his blood pressure with the goal of decreasing his risk of heart attack and stroke. Clayton [name] blood pressure is stable. He discontinued HCTZ last visit but blood pressure did not increase. He states he is running 101-109/68-82 this week and still feels slightly lightheaded at times.     ASSESSMENT AND PLAN:  Prediabetes - Plan: metFORMIN (GLUCOPHAGE) 500 MG tablet  Essential hypertension  Class 1 obesity with serious comorbidity and body mass index (BMI) of 32.0 to 32.9 in adult, unspecified obesity type  PLAN: Pre-Diabetes Clayton [name] will continue to work on weight loss, exercise, and decreasing simple carbohydrates in his diet to help decrease the risk of diabetes. We dicussed metformin including benefits and risks. He was informed that eating too many simple carbohydrates or too many calories at one sitting increases the likelihood of GI side effects. Clayton [name] agreed to continue Metformin 500 mg daily #30 with no refills.  Clayton [name] agreed to follow up with [name] as directed to monitor his progress.  Hypertension We discussed sodium restriction, working on healthy weight loss, and a regular exercise program as the means to achieve improved blood pressure control. Clayton [name] agreed with this plan and agreed to follow up as directed. We will continue to monitor his blood pressure as well as his progress with the above lifestyle modifications. He will continue his medications as prescribed and will watch for signs of hypotension as he continues his lifestyle modifications. He agreed to contact his cardiologist to see if he wants to change medications.   Obesity Clayton [name] is currently in the action stage of change. As such, his goal is to continue with weight loss efforts He has agreed to keep a food journal with 1800 calories and 120g of protein daily.  Encouraged to continue journaling strictly and exercise. If no improvement we will retest his RMR  as soon as it is safe to have an in-office visit.  Clayton [name] has been instructed to work up to a goal of 150 minutes of combined cardio and strengthening exercise per week for weight loss and overall health benefits. We discussed the following Behavioral Modification Stratagies today: increasing lean protein intake, decreasing simple carbohydrates   and work on meal planning and easy cooking plans   Clayton [name] has agreed to follow up with our clinic in 3 weeks. He was informed of the importance of frequent follow up visits to maximize his success with intensive lifestyle modifications for his multiple health conditions.  ALLERGIES: Allergies  Allergen Reactions  . Statins     MEDICATIONS: Current Outpatient Medications on File Prior to Visit  Medication Sig Dispense Refill  . amLODipine-benazepril (LOTREL) 10-40 MG capsule TAKE 1 CAPSULE BY MOUTH EVERY DAY IN THE EVENING 90 capsule 3  . aspirin EC 81 MG tablet Take 1 tablet (81 mg total) by mouth daily. 90 tablet 3  . Boswellia-Glucosamine-Vit D (GLUCOSAMINE COMPLEX PO) Take 1,500 mg by mouth every morning.     Marland Kitchen co-enzyme Q-10 30 MG capsule Take 200 mg by mouth daily.     Marland Kitchen ezetimibe (ZETIA) 10 MG tablet TAKE 1 TABLET BY MOUTH EVERY DAY 90 tablet 3  . LIVALO 2 MG TABS TAKE 1 TABLET BY MOUTH EVERY DAY 90 tablet 3  . metoprolol succinate (TOPROL-XL) 100 MG 24 hr tablet TAKE 1 TABLET (100 MG TOTAL) BY MOUTH EVERY EVENING. TAKE WITH OR IMMEDIATELY FOLLOWING A MEAL. 90 tablet 3  . Multiple Vitamin (MULITIVITAMIN WITH MINERALS) TABS Take 1 tablet by mouth every evening.     . mupirocin ointment (BACTROBAN) 2 % Place 1 application into the nose 2 (two) times daily. 22 g 0  . Omega 3-6-9 Fatty Acids (OMEGA-3-6-9 PO) Take 1 capsule by mouth every morning. Omega 3= 800mg ,omega 6=380mg ,omega 9 =300mg     . spironolactone (ALDACTONE) 25 MG tablet TAKE 1/2 TABLET DAILY 45 tablet 3  . triamcinolone cream (KENALOG) 0.5 % APPLY 1 APPLICATION TOPICALLY 2 (TWO) TIMES DAILY. TO AFFECTED AREAS. 30 g 3  . vitamin C (ASCORBIC ACID) 500 MG tablet Take 500 mg by mouth every morning.     . zinc gluconate 50 MG tablet Take 50 mg by mouth every evening.     Marland Kitchen Zn-Pyg Afri-Nettle-Saw Palmet (SAW PALMETTO COMPLEX PO) Take 1 capsule by mouth every morning.      No current facility-administered medications on file  prior to visit.     PAST MEDICAL HISTORY: Past Medical History:  Diagnosis Date  . Arthritis    hips   . Atrial fibrillation ([place])   . Cataract   . Dysrhythmia    PAF- hx of 7 years ago   . HLD (hyperlipidemia)   . Hypertension 05/29/10   ECHO-EF 67% wnl  . Meniere disease   . Obesity   . Palpitations   . Sleep apnea 09/14/04   [facility]- Dr. Humphrey Rolls; CPAP titration 11/12/04- Dr. Humphrey Rolls    PAST SURGICAL HISTORY: Past Surgical History:  Procedure Laterality Date  . CARDIAC CATHETERIZATION  11/03/05  . CATARACT EXTRACTION  2014  . KNEE ARTHROSCOPY     left knee- 2002   . TOTAL HIP ARTHROPLASTY  06/08/2011   Procedure: TOTAL HIP ARTHROPLASTY ANTERIOR APPROACH;  Surgeon: [name], MD;  Location: [facility];  Service: Orthopedics;  Laterality: Left;    SOCIAL HISTORY: Social History   Tobacco Use  . Smoking  status: Never Smoker  . Smokeless tobacco: Never Used  Substance Use Topics  . Alcohol use: No  . Drug use: No    FAMILY HISTORY: Family History  Problem Relation Age of Onset  . Hypertension Mother   . Stroke Mother   . Diabetes Mother   . Hyperlipidemia Mother   . Obesity Mother   . Diabetes Sister   . Stroke Maternal Grandmother        36 died   . Heart disease Maternal Grandfather   . Hypertension Maternal Grandfather   . Diabetes Maternal Grandfather   . Arthritis Father   . Heart disease Father   . Heart disease Paternal Grandfather     ROS: Review of Systems  Respiratory: Negative for shortness of breath.   Cardiovascular: Negative for chest pain.  Endo/Heme/Allergies:       Negative for hypoglycemia     PHYSICAL EXAM: Pt in no acute distress  RECENT LABS AND TESTS: BMET    Component Value Date/Time   NA 140 10/17/2017 0858   K 4.5 10/17/2017 0858   CL 104 10/17/2017 0858   CO2 20 10/17/2017 0858   GLUCOSE 108 (H) 10/17/2017 0858   GLUCOSE 111 (H) 10/12/2016 0902   BUN 25 10/17/2017 0858   CREATININE 1.40 (H)  10/17/2017 0858   CREATININE 1.32 (H) 10/12/2016 0902   CALCIUM 9.5 10/17/2017 0858   GFRNONAA 50 (L) 10/17/2017 0858   GFRNONAA 54 (L) 10/12/2016 0902   GFRAA 58 (L) 10/17/2017 0858   GFRAA 63 10/12/2016 0902   Lab Results  Component Value Date   HGBA1C 6.0 (H) 11/01/2017   HGBA1C 5.4 04/14/2016   HGBA1C 6.0 06/09/2015   Lab Results  Component Value Date   INSULIN 22.4 11/01/2017   CBC    Component Value Date/Time   WBC 5.4 12/08/2015 0945   RBC 4.87 12/08/2015 0945   HGB 15.2 12/08/2015 0945   HCT 44.1 12/08/2015 0945   PLT 194 12/08/2015 0945   MCV 90.6 12/08/2015 0945   MCH 31.2 12/08/2015 0945   MCHC 34.5 12/08/2015 0945   RDW 13.6 12/08/2015 0945   LYMPHSABS 1.5 08/27/2014 1030   MONOABS 0.5 08/27/2014 1030   EOSABS 0.1 08/27/2014 1030   BASOSABS 0.0 08/27/2014 1030   Iron/TIBC/Ferritin/ %Sat No results found for: IRON, TIBC, FERRITIN, IRONPCTSAT Lipid Panel     Component Value Date/Time   CHOL 113 10/17/2017 0858   TRIG 90 10/17/2017 0858   HDL 45 10/17/2017 0858   CHOLHDL 3.8 10/12/2016 0902   VLDL 17 04/14/2016 0937   LDLCALC 50 10/17/2017 0858   LDLCALC 119 (H) 10/12/2016 0902   Hepatic Function Panel     Component Value Date/Time   PROT 6.8 10/17/2017 0858   ALBUMIN 4.6 10/17/2017 0858   AST 36 10/17/2017 0858   ALT 45 (H) 10/17/2017 0858   ALKPHOS 59 10/17/2017 0858   BILITOT 1.1 10/17/2017 0858      Component Value Date/Time   TSH 1.28 12/08/2015 0945   TSH 0.60 06/09/2015 1056      I, Renee Ramus, am acting as Location manager for Dennard Nip, MD  I have reviewed the above documentation for accuracy and completeness, and I agree with the above. -Dennard Nip, MD

## 2018-07-19 ENCOUNTER — Telehealth: Payer: Self-pay | Admitting: Cardiovascular Disease

## 2018-07-19 NOTE — Telephone Encounter (Signed)
LVM for patient to let him know the appointment was scheduled.

## 2018-07-19 NOTE — Telephone Encounter (Signed)
New message   Pt c/o BP issue: STAT if pt c/o blurred vision, one-sided weakness or slurred speech  1. What are your last 5 BP readings? 112/83  107/75   105/72   101/74  109/82  2. Are you having any other symptoms (ex. Dizziness, headache, blurred vision, passed out)? Dizziness  3. What is your BP issue? Blood pressure is too low

## 2018-07-19 NOTE — Telephone Encounter (Signed)
Please schedule for 07/22- at 1:40- please notify patient of appointment. Thanks!

## 2018-07-19 NOTE — Telephone Encounter (Signed)
Returned call to patient he stated he wants to see Dr.Kelly before Oct.Stated since his last appointment he has lost 40 lbs.Stated his B/P has been low.Readings listed below.Stated when he stands up he gets dizzy.Stated he only wants to see Dr.Kelly.Advised I will send message to Dr.Kelly.

## 2018-07-25 ENCOUNTER — Other Ambulatory Visit (INDEPENDENT_AMBULATORY_CARE_PROVIDER_SITE_OTHER): Payer: Self-pay | Admitting: Family Medicine

## 2018-07-25 DIAGNOSIS — R7303 Prediabetes: Secondary | ICD-10-CM

## 2018-07-25 NOTE — Telephone Encounter (Signed)
Has patient been notified?  Just wanted to make sure they are aware. Thanks!

## 2018-07-27 ENCOUNTER — Telehealth (INDEPENDENT_AMBULATORY_CARE_PROVIDER_SITE_OTHER): Payer: Medicare Other | Admitting: Family Medicine

## 2018-07-27 ENCOUNTER — Other Ambulatory Visit: Payer: Self-pay

## 2018-07-27 ENCOUNTER — Encounter (INDEPENDENT_AMBULATORY_CARE_PROVIDER_SITE_OTHER): Payer: Self-pay | Admitting: Family Medicine

## 2018-07-27 DIAGNOSIS — R7303 Prediabetes: Secondary | ICD-10-CM

## 2018-07-27 DIAGNOSIS — E669 Obesity, unspecified: Secondary | ICD-10-CM

## 2018-07-27 DIAGNOSIS — Z6832 Body mass index (BMI) 32.0-32.9, adult: Secondary | ICD-10-CM | POA: Diagnosis not present

## 2018-07-27 MED ORDER — METFORMIN HCL 500 MG PO TABS: 500.0000 mg | ORAL_TABLET | Freq: Every day | ORAL | 0 refills | Status: DC

## 2018-07-27 NOTE — Telephone Encounter (Signed)
Called patient, he is aware of his appointment.  Denied any symptoms of covid at this time, aware to wear mask and come alone.

## 2018-08-01 NOTE — Progress Notes (Signed)
Office: [phone]  /  Fax: [phone] TeleHealth Visit:  Clayton [name] has verbally consented to this TeleHealth visit today. The patient is located at home, the provider is located at the [employer] [facility] office. The participants in this visit include the listed provider and patient. The visit was conducted today via webex.  HPI:   Chief Complaint: OBESITY Clayton [name] is here to discuss his progress with his obesity treatment plan. He is on the keep a food journal with 1800 calories and 120 grams of protein daily and is following his eating plan approximately 100 % of the time. He states he is walking, biking, and golfing for 30-60 minutes 7 times per week. Clayton [name] states his weight is at 242 lbs today, and he has maintained his weight since his last visit. He is exercising daily and states he is journaling everything he eats, but he admits he is using more oil while cooking and is not journaling this.  We were unable to weigh the patient today for this TeleHealth visit. He feels as if he has maintained his weight since his last visit. He has lost 23 lbs since starting treatment with [name].  Pre-Diabetes Clayton [name] has a diagnosis of pre-diabetes based on his elevated Hgb A1c and was informed this puts him at greater risk of developing diabetes. Last A1c was 6.0. He is stable on metformin and denies nausea, vomiting, or hypoglycemia. He is working on diet and exercise to decrease risk of diabetes.   ASSESSMENT AND PLAN:  Class 1 obesity with serious comorbidity and body mass index (BMI) of 32.0 to 32.9 in adult, unspecified obesity type  Prediabetes - Plan: metFORMIN (GLUCOPHAGE) 500 MG tablet  PLAN:  Pre-Diabetes Clayton [name] will continue to work on weight loss, diet, exercise, and decreasing simple carbohydrates in his diet to help decrease the risk of diabetes. We dicussed metformin including benefits and risks. He was informed that eating too many simple carbohydrates or too many  calories at one sitting increases the likelihood of GI side effects. Clayton [name] agrees to continue taking metformin 500 mg PO q AM #30 and we will refill for 1 month. Clayton [name] agrees to follow up with our clinic in 2 weeks as directed to monitor his progress.  I spent > than 50% of the 25 minute visit on counseling as documented in the note.  Obesity Clayton [name] is currently in the action stage of change. As such, his goal is to continue with weight loss efforts He has agreed to change to follow a lower carbohydrate, vegetable and lean protein rich diet plan Clayton [name] has been instructed to work up to a goal of 150 minutes of combined cardio and strengthening exercise per week for weight loss and overall health benefits. We discussed the following Behavioral Modification Strategies today: increasing lean protein intake, decreasing simple carbohydrates , work on meal planning and easy cooking plans and emotional eating strategies   Clayton [name] has agreed to follow up with our clinic in 2 weeks. He was informed of the importance of frequent follow up visits to maximize his success with intensive lifestyle modifications for his multiple health conditions.  ALLERGIES: Allergies  Allergen Reactions  . Statins     MEDICATIONS: Current Outpatient Medications on File Prior to Visit  Medication Sig Dispense Refill  . amLODipine-benazepril (LOTREL) 10-40 MG capsule TAKE 1 CAPSULE BY MOUTH EVERY DAY IN THE EVENING 90 capsule 3  . aspirin EC 81 MG tablet Take 1 tablet (81 mg total) by mouth daily. [place]  tablet 3  . Boswellia-Glucosamine-Vit D (GLUCOSAMINE COMPLEX PO) Take 1,500 mg by mouth every morning.     Marland Kitchen co-enzyme Q-10 30 MG capsule Take 200 mg by mouth daily.     Marland Kitchen ezetimibe (ZETIA) 10 MG tablet TAKE 1 TABLET BY MOUTH EVERY DAY 90 tablet 3  . LIVALO 2 MG TABS TAKE 1 TABLET BY MOUTH EVERY DAY 90 tablet 3  . metoprolol succinate (TOPROL-XL) 100 MG 24 hr tablet TAKE 1 TABLET (100 MG TOTAL) BY MOUTH EVERY EVENING.  TAKE WITH OR IMMEDIATELY FOLLOWING A MEAL. 90 tablet 3  . Multiple Vitamin (MULITIVITAMIN WITH MINERALS) TABS Take 1 tablet by mouth every evening.     . mupirocin ointment (BACTROBAN) 2 % Place 1 application into the nose 2 (two) times daily. 22 g 0  . Omega 3-6-9 Fatty Acids (OMEGA-3-6-9 PO) Take 1 capsule by mouth every morning. Omega 3= 800mg ,omega 6=380mg ,omega 9 =300mg     . spironolactone (ALDACTONE) 25 MG tablet TAKE 1/2 TABLET DAILY 45 tablet 3  . triamcinolone cream (KENALOG) 0.5 % APPLY 1 APPLICATION TOPICALLY 2 (TWO) TIMES DAILY. TO AFFECTED AREAS. 30 g 3  . vitamin C (ASCORBIC ACID) 500 MG tablet Take 500 mg by mouth every morning.     . zinc gluconate 50 MG tablet Take 50 mg by mouth every evening.     Marland Kitchen Zn-Pyg Afri-Nettle-Saw Palmet (SAW PALMETTO COMPLEX PO) Take 1 capsule by mouth every morning.      No current facility-administered medications on file prior to visit.     PAST MEDICAL HISTORY: Past Medical History:  Diagnosis Date  . Arthritis    hips   . Atrial fibrillation ([place])   . Cataract   . Dysrhythmia    PAF- hx of 7 years ago   . HLD (hyperlipidemia)   . Hypertension 05/29/10   ECHO-EF 67% wnl  . Meniere disease   . Obesity   . Palpitations   . Sleep apnea 09/14/04   [facility]- Dr. Humphrey Rolls; CPAP titration 11/12/04- Dr. Humphrey Rolls    PAST SURGICAL HISTORY: Past Surgical History:  Procedure Laterality Date  . CARDIAC CATHETERIZATION  11/03/05  . CATARACT EXTRACTION  2014  . KNEE ARTHROSCOPY     left knee- 2002   . TOTAL HIP ARTHROPLASTY  06/08/2011   Procedure: TOTAL HIP ARTHROPLASTY ANTERIOR APPROACH;  Surgeon: [name], MD;  Location: [facility];  Service: Orthopedics;  Laterality: Left;    SOCIAL HISTORY: Social History   Tobacco Use  . Smoking status: Never Smoker  . Smokeless tobacco: Never Used  Substance Use Topics  . Alcohol use: No  . Drug use: No    FAMILY HISTORY: Family History  Problem Relation Age of Onset  .  Hypertension Mother   . Stroke Mother   . Diabetes Mother   . Hyperlipidemia Mother   . Obesity Mother   . Diabetes Sister   . Stroke Maternal Grandmother        56 died   . Heart disease Maternal Grandfather   . Hypertension Maternal Grandfather   . Diabetes Maternal Grandfather   . Arthritis Father   . Heart disease Father   . Heart disease Paternal Grandfather     ROS: Review of Systems  Constitutional: Negative for weight loss.  Gastrointestinal: Negative for nausea and vomiting.  Endo/Heme/Allergies:       Negative hypoglycemia    PHYSICAL EXAM: Pt in no acute distress  RECENT LABS AND TESTS: BMET  Component Value Date/Time   NA 140 10/17/2017 0858   K 4.5 10/17/2017 0858   CL 104 10/17/2017 0858   CO2 20 10/17/2017 0858   GLUCOSE 108 (H) 10/17/2017 0858   GLUCOSE 111 (H) 10/12/2016 0902   BUN 25 10/17/2017 0858   CREATININE 1.40 (H) 10/17/2017 0858   CREATININE 1.32 (H) 10/12/2016 0902   CALCIUM 9.5 10/17/2017 0858   GFRNONAA 50 (L) 10/17/2017 0858   GFRNONAA 54 (L) 10/12/2016 0902   GFRAA 58 (L) 10/17/2017 0858   GFRAA 63 10/12/2016 0902   Lab Results  Component Value Date   HGBA1C 6.0 (H) 11/01/2017   HGBA1C 5.4 04/14/2016   HGBA1C 6.0 06/09/2015   Lab Results  Component Value Date   INSULIN 22.4 11/01/2017   CBC    Component Value Date/Time   WBC 5.4 12/08/2015 0945   RBC 4.87 12/08/2015 0945   HGB 15.2 12/08/2015 0945   HCT 44.1 12/08/2015 0945   PLT 194 12/08/2015 0945   MCV 90.6 12/08/2015 0945   MCH 31.2 12/08/2015 0945   MCHC 34.5 12/08/2015 0945   RDW 13.6 12/08/2015 0945   LYMPHSABS 1.5 08/27/2014 1030   MONOABS 0.5 08/27/2014 1030   EOSABS 0.1 08/27/2014 1030   BASOSABS 0.0 08/27/2014 1030   Iron/TIBC/Ferritin/ %Sat No results found for: IRON, TIBC, FERRITIN, IRONPCTSAT Lipid Panel     Component Value Date/Time   CHOL 113 10/17/2017 0858   TRIG 90 10/17/2017 0858   HDL 45 10/17/2017 0858   CHOLHDL 3.8 10/12/2016 0902    VLDL 17 04/14/2016 0937   LDLCALC 50 10/17/2017 0858   LDLCALC 119 (H) 10/12/2016 0902   Hepatic Function Panel     Component Value Date/Time   PROT 6.8 10/17/2017 0858   ALBUMIN 4.6 10/17/2017 0858   AST 36 10/17/2017 0858   ALT 45 (H) 10/17/2017 0858   ALKPHOS 59 10/17/2017 0858   BILITOT 1.1 10/17/2017 0858      Component Value Date/Time   TSH 1.28 12/08/2015 0945   TSH 0.60 06/09/2015 1056      I, Trixie Dredge, am acting as Location manager for Dennard Nip, MD I have reviewed the above documentation for accuracy and completeness, and I agree with the above. -Dennard Nip, MD

## 2018-08-04 ENCOUNTER — Telehealth: Payer: Self-pay | Admitting: Cardiovascular Disease

## 2018-08-04 DIAGNOSIS — E782 Mixed hyperlipidemia: Secondary | ICD-10-CM

## 2018-08-04 NOTE — Telephone Encounter (Signed)
Left message for patient, order for lipid and cmet placed. Patient instructed to wear a mask and come alone for lab draw.

## 2018-08-04 NOTE — Telephone Encounter (Signed)
° °  Patient wanted to know if he had to have blood work done before his appointment next week. He can come in Monday morning for labs, but just wants to know what will make his upcoming appointment go smoothly

## 2018-08-07 LAB — LIPID PANEL
Chol/HDL Ratio: 2.3 ratio (ref 0.0–5.0)
Cholesterol, Total: 117 mg/dL (ref 100–199)
HDL: 50 mg/dL (ref >39)
LDL Calculated: 54 mg/dL (ref 0–99)
Triglycerides: 67 mg/dL (ref 0–149)
VLDL Cholesterol Cal: 13 mg/dL (ref 5–40)

## 2018-08-07 LAB — COMPREHENSIVE METABOLIC PANEL
ALT: 25 IU/L (ref 0–44)
AST: 26 IU/L (ref 0–40)
Albumin/Globulin Ratio: 2.1 (ref 1.2–2.2)
Albumin: 4.4 g/dL (ref 3.7–4.7)
Alkaline Phosphatase: 49 IU/L (ref 39–117)
BUN/Creatinine Ratio: 18 (ref 10–24)
BUN: 26 mg/dL (ref 8–27)
Bilirubin Total: 1.3 mg/dL — ABNORMAL HIGH (ref 0.0–1.2)
CO2: 21 mmol/L (ref 20–29)
Calcium: 9.3 mg/dL (ref 8.6–10.2)
Chloride: 104 mmol/L (ref 96–106)
Creatinine, Ser: 1.44 mg/dL — ABNORMAL HIGH (ref 0.76–1.27)
GFR calc Af Amer: 56 mL/min/{1.73_m2} — ABNORMAL LOW (ref >59)
GFR calc non Af Amer: 49 mL/min/{1.73_m2} — ABNORMAL LOW (ref >59)
Globulin, Total: 2.1 g/dL (ref 1.5–4.5)
Glucose: 110 mg/dL — ABNORMAL HIGH (ref 65–99)
Potassium: 4.8 mmol/L (ref 3.5–5.2)
Sodium: 141 mmol/L (ref 134–144)
Total Protein: 6.5 g/dL (ref 6.0–8.5)

## 2018-08-09 ENCOUNTER — Telehealth: Payer: Self-pay | Admitting: Cardiovascular Disease

## 2018-08-09 ENCOUNTER — Encounter: Payer: Self-pay | Admitting: Cardiovascular Disease

## 2018-08-09 ENCOUNTER — Other Ambulatory Visit: Payer: Self-pay

## 2018-08-09 ENCOUNTER — Ambulatory Visit (INDEPENDENT_AMBULATORY_CARE_PROVIDER_SITE_OTHER): Payer: Medicare Other | Admitting: Cardiovascular Disease

## 2018-08-09 VITALS — BP 131/83 | HR 89 | Ht 75.0 in | Wt 244.6 lb

## 2018-08-09 DIAGNOSIS — I4819 Other persistent atrial fibrillation: Secondary | ICD-10-CM

## 2018-08-09 DIAGNOSIS — E669 Obesity, unspecified: Secondary | ICD-10-CM

## 2018-08-09 DIAGNOSIS — G4733 Obstructive sleep apnea (adult) (pediatric): Secondary | ICD-10-CM | POA: Diagnosis not present

## 2018-08-09 DIAGNOSIS — I10 Essential (primary) hypertension: Secondary | ICD-10-CM

## 2018-08-09 DIAGNOSIS — E782 Mixed hyperlipidemia: Secondary | ICD-10-CM

## 2018-08-09 DIAGNOSIS — Z5181 Encounter for therapeutic drug level monitoring: Secondary | ICD-10-CM | POA: Diagnosis not present

## 2018-08-09 DIAGNOSIS — Z7901 Long term (current) use of anticoagulants: Secondary | ICD-10-CM

## 2018-08-09 DIAGNOSIS — Z9989 Dependence on other enabling machines and devices: Secondary | ICD-10-CM

## 2018-08-09 MED ORDER — DILTIAZEM HCL ER COATED BEADS 180 MG PO CP24: 180.0000 mg | ORAL_CAPSULE | Freq: Every day | ORAL | 3 refills | Status: DC

## 2018-08-09 MED ORDER — APIXABAN 5 MG PO TABS: 5.0000 mg | ORAL_TABLET | Freq: Two times a day (BID) | ORAL | 1 refills | Status: DC

## 2018-08-09 MED ORDER — OLMESARTAN MEDOXOMIL 20 MG PO TABS: 20.0000 mg | ORAL_TABLET | Freq: Every day | ORAL | 1 refills | Status: DC

## 2018-08-09 NOTE — Patient Instructions (Signed)
Medication Instructions:  Stop Lotrel  Start Olmesartan 20 mg daily. Start Diltiazem CD 180 mg daily. Start Eliquis 5 mg twice daily.  If you need a refill on your cardiac medications before your next appointment, please call your pharmacy.   Lab work: CBC, TSH, MAG  If you have labs (blood work) drawn today and your tests are completely normal, you will receive your results only by: Marland Kitchen MyChart Message (if you have MyChart) OR . A paper copy in the mail If you have any lab test that is abnormal or we need to change your treatment, we will call you to review the results.  Testing/Procedures: Echocardiogram - Your physician has requested that you have an echocardiogram. Echocardiography is a painless test that uses sound waves to create images of your heart. It provides your doctor with information about the size and shape of your heart and how well your heart's chambers and valves are working. This procedure takes approximately one hour. There are no restrictions for this procedure. This will be performed at our [facility] location - [address], [address].   Follow-Up: At [facility], you and your health needs are our priority.  As part of our continuing mission to provide you with exceptional heart care, we have created designated Provider Care Teams.  These Care Teams include your primary Cardiologist (physician) and Advanced Practice Providers (APPs -  Physician Assistants and Nurse Practitioners) who all work together to provide you with the care you need, when you need it. You will need a follow up appointment in 4 weeks.  Please call our office 2 months in advance to schedule this appointment.  You may see Dr.Kelly or one of the following Advanced Practice Providers on your designated Care Team: Almyra Deforest, [place] . Fabian Sharp, PA-C

## 2018-08-09 NOTE — Telephone Encounter (Signed)

## 2018-08-09 NOTE — Progress Notes (Signed)
Patient ID: Clayton [name], male   DOB: May 27, 1945, 73 y.o.   MRN: [id number]     HPI: Clayton [name] is a 73 y.o. male who presents for a 10 month follow-up cardiology followup evaluation.  [name] has a history of hypertension, a remote history of PAF, status post cardioversion in 2007, obstructive sleep apnea for which he uses CPAP 100% of the time, hypertension, and hyperlipidemia. He has had weight fluctuations over the years.  In the past, he also had a history of Mnire's disease.  There was some concern by [name] that perhaps this may have been related to statin use, which led to its discontinuance.  He is not had Mnire's disease in some time.  He underwent left hip replacement surgery by Dr. [name] and now has been able to continue to be active and exercises routinely.   His  blood pressure regimen has been amlodipine/benazepril 10/40 daily, hydrochlorothiazide has been taking 25 Mill grams in the evening and Toprol-XL 100 mg daily. He had atrial of livalo  For hyperlipidemia but developed some diarrhea secondary to this.    He has not had much success with weight loss over the past several years.    He continues to use his CPAP therapy with 100% compliance.  He will not even take a nap without his CPAP therapy and he takes his CPAP unit on alll his travels.   Since initiating CPAP therapy, he is unaware of any palpitations.  He denies any recurrent atrial fibrillation.  In the past he has had difficulty with family stress and his father had passed away, and his wife's mother recently died after having developed lung CA and had significant PVD.  He was not exercising as much as he had in the past.  He has not been successful in weight loss.  He continues to be active and still walks 3-4 miles per day.  Oftentimes in the morning when he awakens his blood pressure is elevated at 150/90 but after exercise it drops to 128/80.  He is unaware of any recurrent atrial fibrillation.  He  continues to use CPAP with 100% compliance.   He completed a two-year grieving process.  On 01/19/2016 he committed to weight loss.  On January 1, he weighed 278 pounds .  He has been walking 4 miles 5 days per week as well as some intermittent stationary bike.  He has lost a total of 32 pounds since January.  He continues to use CPAP with 100% compliance and since he has been on CPAP, he has not had any recurrent atrial fibrillation and his blood pressure has been less labile.  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  He has had failures to Crestor and Lipitor in the past due to significant myalgias, development, even with weekly dosing.  I resumed zetia   In August 2018 he noticed his heart rate increasing and he presented for evaluation and was seen by [name], PAC.  He admitted to a rare palpitation.  He denied any chest pain with exertion or dyspnea on exertion.  He has suffered a broken rib.  This year.  He has a weight goal of 220 pounds.    I  saw him in September 2018, at which time he was doing well from a cardiovascular standpoint.   His father was ill and ultimately passed away in [place].  As result he was going back and forth to the [place] area.  This  resulted in a change in his diet and ability to exercise.  I last saw him in October 2019 and his weight had increased from 240 back up to 270 pounds.  He was unaware of any episodes of atrial fibrillation.  He was using CPAP with 100% compliance along with his naps.  He was committed to begin weight loss again.    Since I saw him on October 19, 2018 he has had major lifestyle change.  He has been working with Dr. Leafy Ro in the [facility].  Peak weight was 280 and most recent weight 244.  Over the past 4 months, he has been under increased stress as result of his wife's illness.  He has been checking his blood pressure regularly and this has been stable but on his blood pressure recording he has  noticed the message of heart rate irregularity.  He is unaware of A. fib but has noticed this message for the last 3 to 4 months.  He is exercising every day and typically walks 2 miles in the morning and in the afternoon or evening does 30 minutes of stationary bike at least 5 to 6 days/week.  He continues to use CPAP.  I obtained a new download in the office today from June 22 through August 08, 2018.  He is 100% compliant.  He has a ResMed air sense 10 auto unit with a pressure range of 8-20 with 95% pressure at 10.1 and maximum average and 11.5.  AHI is excellent at 1.2.  He presents for evaluation.   Past Medical History:  Diagnosis Date   Arthritis    hips    Atrial fibrillation ([place])    Cataract    Dysrhythmia    PAF- hx of 7 years ago    HLD (hyperlipidemia)    Hypertension 05/29/10   ECHO-EF 67% wnl   Meniere disease    Obesity    Palpitations    Sleep apnea 09/14/04   [facility]- Dr. Humphrey Rolls; CPAP titration 11/12/04- Dr. Humphrey Rolls    Past Surgical History:  Procedure Laterality Date   CARDIAC CATHETERIZATION  11/03/05   CATARACT EXTRACTION  2014   KNEE ARTHROSCOPY     left knee- 2002    TOTAL HIP ARTHROPLASTY  06/08/2011   Procedure: TOTAL HIP ARTHROPLASTY ANTERIOR APPROACH;  Surgeon: [name], MD;  Location: [facility];  Service: Orthopedics;  Laterality: Left;    Allergies  Allergen Reactions   Statins     Current Outpatient Medications  Medication Sig Dispense Refill   aspirin EC 81 MG tablet Take 1 tablet (81 mg total) by mouth daily. 90 tablet 3   Boswellia-Glucosamine-Vit D (GLUCOSAMINE COMPLEX PO) Take 1,500 mg by mouth every morning.      co-enzyme Q-10 30 MG capsule Take 200 mg by mouth daily.      ezetimibe (ZETIA) 10 MG tablet TAKE 1 TABLET BY MOUTH EVERY DAY 90 tablet 3   LIVALO 2 MG TABS TAKE 1 TABLET BY MOUTH EVERY DAY 90 tablet 3   metoprolol succinate (TOPROL-XL) 100 MG 24 hr tablet TAKE 1 TABLET (100 MG TOTAL) BY MOUTH  EVERY EVENING. TAKE WITH OR IMMEDIATELY FOLLOWING A MEAL. 90 tablet 3   Multiple Vitamin (MULITIVITAMIN WITH MINERALS) TABS Take 1 tablet by mouth every evening.      mupirocin ointment (BACTROBAN) 2 % Place 1 application into the nose 2 (two) times daily. 22 g 0   Omega 3-6-9 Fatty Acids (OMEGA-3-6-9  PO) Take 1 capsule by mouth every morning. Omega 3= 849m,omega 6=3877momega 9 =30046m   spironolactone (ALDACTONE) 25 MG tablet TAKE 1/2 TABLET DAILY 45 tablet 3   triamcinolone cream (KENALOG) 0.5 % APPLY 1 APPLICATION TOPICALLY 2 (TWO) TIMES DAILY. TO AFFECTED AREAS. 30 g 3   vitamin C (ASCORBIC ACID) 500 MG tablet Take 500 mg by mouth every morning.      zinc gluconate 50 MG tablet Take 50 mg by mouth every evening.      Zn-Pyg Afri-Nettle-Saw Palmet (SAW PALMETTO COMPLEX PO) Take 1 capsule by mouth every morning.      apixaban (ELIQUIS) 5 MG TABS tablet Take 1 tablet (5 mg total) by mouth 2 (two) times daily. 180 tablet 1   diltiazem (CARDIZEM CD) 180 MG 24 hr capsule Take 1 capsule (180 mg total) by mouth daily. 90 capsule 3   metFORMIN (GLUCOPHAGE) 500 MG tablet Take 1 tablet (500 mg total) by mouth daily with breakfast. 30 tablet 0   olmesartan (BENICAR) 20 MG tablet Take 1 tablet (20 mg total) by mouth daily. 90 tablet 1   No current facility-administered medications for this visit.     Socially he is remarried after his first wife passed away. He has 2 children. 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 is no tobacco history.  He plays golf at least 2 days/week and this past year not only did he have a hole in 1 but he has also shot his age.  ROS General: Negative; No fevers, chills, or night sweats;  HEENT: Negative; No changes in vision or hearing, sinus congestion, difficulty swallowing Pulmonary: Negative; No cough, wheezing, shortness of breath, hemoptysis Cardiovascular: See HPI GI: Negative; No nausea, vomiting, diarrhea, or abdominal  pain GU: Negative; No dysuria, hematuria, or difficulty voiding Musculoskeletal: He is status post rib fracture.  He is status post hip replacement. Hematologic/Oncology: Negative; no easy bruising, bleeding Endocrine: Negative; no heat/cold intolerance; no diabetes Neuro: No vertigo or imbalance issues Skin: Negative; No rashes or skin lesions Psychiatric: Negative; No behavioral problems, depression Sleep: Positive for obstructive sleep apnea on CPAP therapy with 100% compliance No snoring, daytime sleepiness, hypersomnolence, bruxism, restless legs, hypnogognic hallucinations, no cataplexy Other comprehensive 14 point system review is negative.  PE BP 131/83    Pulse 89    Ht 6' 3"  (1.905 m)    Wt 244 lb 9.6 oz (110.9 kg)    SpO2 98%    BMI 30.57 kg/m    Repeat blood pressure by me was 140/82; pulse was irregularly irregular consistent with atrial fibrillation with rate at 89  Wt Readings from Last 3 Encounters:  08/09/18 244 lb 9.6 oz (110.9 kg)  05/31/18 247 lb (112 kg)  03/30/18 243 lb (110.2 kg)    General: Alert, oriented, no distress.  Skin: normal turgor, no rashes, warm and dry HEENT: Normocephalic, atraumatic. Pupils equal round and reactive to light; sclera anicteric; extraocular muscles intact;  Nose without nasal septal hypertrophy Mouth/Parynx benign; Mallinpatti scale 3 Neck: No JVD, no carotid bruits; normal carotid upstroke Lungs: clear to ausculatation and percussion; no wheezing or rales Chest wall: without tenderness to palpitation Heart: PMI not displaced, irregularly regular rate around 90, s1 s2 normal, 1/6 systolic murmur, no diastolic murmur, no rubs, gallops, thrills, or heaves Abdomen: soft, nontender; no hepatosplenomehaly, BS+; abdominal aorta nontender and not dilated by palpation. Back: no CVA tenderness Pulses 2+ Musculoskeletal: full range of motion, normal strength, no joint  deformities Extremities: no clubbing cyanosis or edema, Homan's sign  negative  Neurologic: grossly nonfocal; Cranial nerves grossly wnl Psychologic: Normal mood and affect   ECG (independently read by me):Atrial fibrillatoion at 89; LVH with repolarization  October 219 ECG (independently read by me): Normal sinus rhythm at 81 bpm.  Nonspecific intraventricular conduction delay.  Left axis deviation  September 2018 ECG (independently read by me): Normal sinus rhythm at 63 bpm.  Left bundle branch block with repolarization changes.  Normal intervals.  No ectopy.  April 2018 ECG (independently read by me): Normal sinus rhythm at 68 bpm.  LVH with repolarization.  Normal intervals.  November 2017 ECG (independently read by me): Normal sinus rhythm at 78 bpm.  Nonspecific interventricular delay.  QRS complex V1 V2.  August 2016 ECG (independently read by me): Normal sinus rhythm at 77 bpm.  LVH with repolarization changes.  Left axis deviation.  December 2015 ECG (independently read by me): Normal sinus rhythm at 87 bpm.  LVH by voltage criteria with T-wave changes in leads 1 and aVL.  July 2014 ECG: Normal sinus rhythm at 76 beats per minute. LVH with QRS widening.  LABS:  BMP Latest Ref Rng & Units 08/07/2018 10/17/2017 10/12/2016  Glucose 65 - 99 mg/dL 110(H) 108(H) 111(H)  BUN 8 - 27 mg/dL 26 25 24   Creatinine 0.76 - 1.27 mg/dL 1.44(H) 1.40(H) 1.32(H)  BUN/Creat Ratio 10 - 24 18 18 18   Sodium 134 - 144 mmol/L 141 140 138  Potassium 3.5 - 5.2 mmol/L 4.8 4.5 4.5  Chloride 96 - 106 mmol/L 104 104 104  CO2 20 - 29 mmol/L 21 20 28   Calcium 8.6 - 10.2 mg/dL 9.3 9.5 9.4   Hepatic Function Latest Ref Rng & Units 08/07/2018 10/17/2017 10/12/2016  Total Protein 6.0 - 8.5 g/dL 6.5 6.8 6.7  Albumin 3.7 - 4.7 g/dL 4.4 4.6 -  AST 0 - 40 IU/L 26 36 21  ALT 0 - 44 IU/L 25 45(H) 23  Alk Phosphatase 39 - 117 IU/L 49 59 -  Total Bilirubin 0.0 - 1.2 mg/dL 1.3(H) 1.1 1.0   CBC Latest Ref Rng & Units 08/09/2018 12/08/2015 08/27/2014  WBC 3.4 - 10.8 x10E3/uL 6.6 5.4 5.0    Hemoglobin 13.0 - 17.7 g/dL 15.7 15.2 15.3  Hematocrit 37.5 - 51.0 % 47.5 44.1 44.1  Platelets 150 - 450 x10E3/uL 208 194 199   Lab Results  Component Value Date   MCV 92 08/09/2018   MCV 90.6 12/08/2015   MCV 88.4 08/27/2014   Lab Results  Component Value Date   TSH 1.290 08/09/2018    Lab Results  Component Value Date   HGBA1C 6.0 (H) 11/01/2017    Lipid Panel     Component Value Date/Time   CHOL 117 08/07/2018 0901   TRIG 67 08/07/2018 0901   HDL 50 08/07/2018 0901   CHOLHDL 2.3 08/07/2018 0901   CHOLHDL 3.8 10/12/2016 0902   VLDL 17 04/14/2016 0937   LDLCALC 54 08/07/2018 0901   LDLCALC 119 (H) 10/12/2016 0902     RADIOLOGY: No results found.  IMPRESSION:  1. Persistent atrial fibrillation   2. Essential hypertension   3. OSA on CPAP   4. Alteration in anticoagulation   5. Mixed hyperlipidemia   6. Mild obesity     ASSESSMENT AND PLAN: Mr Bradway is a 73 year old gentleman who has a long-standing history of hypertension and has been on amlodipine/benazepril 10/40, HCTZ 25 mg, Toprol-XL 100 mg in addition to  spironolactone 12.5 mg daily.  On this regimen his blood pressure had stabilized and consistently was well controlled.  When I last saw him, he had gained significant amount of weight since that time has had major lifestyle change and has been working closely with Dr. Dalbert Mayotte easily from [facility].  He has lost approximately 36 pounds since his last evaluation.  Over the past 3 to 4 months he admits to having some stress as result of his wife's illness.  He has been taking blood pressures which consistently have been stable but for several months he has been getting a message of heart rate irregularity.  His ECG today confirms that he is back in atrial fibrillation and I suspect this may be of several months duration.  He has a remote history of A. fib back in 2007 and had undergone cardioversion successfully and has been without recurrence  since.  He has been consistently using CPAP therapy and continues to be 100% compliant with his obstructive sleep apnea which undoubtedly is playing a role in his stabilization of his heart rhythm.  With his A. fib of several months of age, I have recommended adjustment of his medical regimen.  I will discontinue his amlodipine/benazepril combination and in its place I will start Cardizem CD 180 mg rather than amlodipine in addition but additional rate control and potential for hopeful reversion to sinus rhythm.  In place of benazepril I will substitute this with ARB therapy with olmesartan 20 mg daily.  I am starting him on Eliquis anticoagulation 5 mg twice a day.  I am scheduling him for an echo Doppler study.  I reviewed his laboratory from earlier this week which showed a creatinine of 1.44.  LFTs were essentially normal although bilirubin was 1.3.  He has continued to be on Livalo and Zetia after being intolerant to other statins.  Lipid studies are excellent with a total cholesterol 117, triglycerides 67, HDL 50, and LDL 54.  I am checking a CBC, TSH and magnesium level today.  I commended him on his weight loss.  I will see him back in the office in 3 to 4 weeks for reevaluation.  If he is still in atrial fibrillation and depending upon his echo he may need antiarrhythmic therapy with plans for repeat cardioversion.  Time spent: 30 minutes  [name], MD, [facility]  08/11/2018 2:39 PM

## 2018-08-10 ENCOUNTER — Other Ambulatory Visit: Payer: Self-pay

## 2018-08-10 ENCOUNTER — Telehealth (INDEPENDENT_AMBULATORY_CARE_PROVIDER_SITE_OTHER): Payer: Medicare Other | Admitting: Family Medicine

## 2018-08-10 ENCOUNTER — Encounter (INDEPENDENT_AMBULATORY_CARE_PROVIDER_SITE_OTHER): Payer: Self-pay | Admitting: Family Medicine

## 2018-08-10 DIAGNOSIS — E669 Obesity, unspecified: Secondary | ICD-10-CM

## 2018-08-10 DIAGNOSIS — R7303 Prediabetes: Secondary | ICD-10-CM | POA: Diagnosis not present

## 2018-08-10 DIAGNOSIS — I4891 Unspecified atrial fibrillation: Secondary | ICD-10-CM | POA: Diagnosis not present

## 2018-08-10 DIAGNOSIS — Z6832 Body mass index (BMI) 32.0-32.9, adult: Secondary | ICD-10-CM | POA: Diagnosis not present

## 2018-08-10 LAB — CBC
Hematocrit: 47.5 % (ref 37.5–51.0)
Hemoglobin: 15.7 g/dL (ref 13.0–17.7)
MCH: 30.5 pg (ref 26.6–33.0)
MCHC: 33.1 g/dL (ref 31.5–35.7)
MCV: 92 fL (ref 79–97)
Platelets: 208 10*3/uL (ref 150–450)
RBC: 5.15 x10E6/uL (ref 4.14–5.80)
RDW: 12.7 % (ref 11.6–15.4)
WBC: 6.6 10*3/uL (ref 3.4–10.8)

## 2018-08-10 LAB — MAGNESIUM: Magnesium: 2.4 mg/dL — ABNORMAL HIGH (ref 1.6–2.3)

## 2018-08-10 LAB — TSH: TSH: 1.29 u[IU]/mL (ref 0.450–4.500)

## 2018-08-10 MED ORDER — METFORMIN HCL 500 MG PO TABS: 500.0000 mg | ORAL_TABLET | Freq: Every day | ORAL | 0 refills | Status: DC

## 2018-08-11 ENCOUNTER — Encounter: Payer: Self-pay | Admitting: Cardiovascular Disease

## 2018-08-14 NOTE — Progress Notes (Signed)
Office: [phone]  /  Fax: [phone] TeleHealth Visit:  Clayton [name] has verbally consented to this TeleHealth visit today. The patient is located at home, the provider is located at the [facility] office. The participants in this visit include the listed provider and patient and any and all parties involved. The visit was conducted today via telephone. Clayton [name] was unable to use realtime audiovisual technology today (Doxy.me failed) and the telehealth visit was conducted via telephone.  HPI:   Chief Complaint: OBESITY Clayton [name] is here to discuss his progress with his obesity treatment plan. He is on the keep a food journal with 1800 calories and 120 grams of protein daily plan and is following his eating plan approximately 100 % of the time. He states he is walking, riding the stationary bike, and golfing 35 to 56 minutes 7 times per week. Clayton [name] continues to do well with weight loss. He has lost another 1 pound with journaling. He weighs 141 pounds today. He feels much better after losing almost 40 pounds. Clayton [name] feels he is stalling and he would like to change to the lower carb plan. We were unable to weigh the patient today for this TeleHealth visit. He feels as if he has lost weight since his last visit.   Pre-Diabetes Clayton [name] has a diagnosis of prediabetes based on his elevated Hgb A1c and was informed this puts him at greater risk of developing diabetes. He is stable on metformin and he is doing very well with diet. He continues to work on diet and exercise to decrease risk of diabetes. He denies nausea, vomiting or hypoglycemia.  Atrial Fibrillation Clayton [name] saw cardiology yesterday, and found he was back in Afib, but he is rate controlled. His medicines were adjusted and he was started back on Eliquis. Patient is pretty upset about this, but he knows he didn't do anything to bring this on.  ASSESSMENT AND PLAN:  Prediabetes - Plan: metFORMIN (GLUCOPHAGE) 500 MG  tablet  Atrial fibrillation, unspecified type ([place])  Class 1 obesity with serious comorbidity and body mass index (BMI) of 32.0 to 32.9 in adult, unspecified obesity type  PLAN:  Pre-Diabetes Clayton [name] will continue to work on weight loss, exercise, and decreasing simple carbohydrates in his diet to help decrease the risk of diabetes. We dicussed metformin including benefits and risks. He was informed that eating too many simple carbohydrates or too many calories at one sitting increases the likelihood of GI side effects. Clayton [name] agreed to continue metformin 500 mg daily with breakfast #30 with no refills and follow up with [name] as directed to monitor his progress. We will check labs at the next visit.  Atrial Fibrillation Clayton [name] was reassured that he has done very well with diet, exercise and weight loss, and that this will help him decrease the risk of exacerbations. We will continue to monitor and Clayton [name] will follow up with our clinic at the agreed upon time.  Obesity Clayton [name] is currently in the action stage of change. As such, his goal is to continue with weight loss efforts He has agreed to follow a lower carbohydrate, vegetable and lean protein rich diet plan Clayton [name] has been instructed to work up to a goal of 150 minutes of combined cardio and strengthening exercise per week for weight loss and overall health benefits. We discussed the following Behavioral Modification Strategies today: increase H2O intake, increasing vegetables and decreasing sodium intake  Clayton [name] has agreed to follow up with our clinic in 3 weeks. He  was informed of the importance of frequent follow up visits to maximize his success with intensive lifestyle modifications for his multiple health conditions.  ALLERGIES: Allergies  Allergen Reactions  . Statins     MEDICATIONS: Current Outpatient Medications on File Prior to Visit  Medication Sig Dispense Refill  . apixaban (ELIQUIS) 5 MG TABS tablet Take 1 tablet (5 mg  total) by mouth 2 (two) times daily. 180 tablet 1  . aspirin EC 81 MG tablet Take 1 tablet (81 mg total) by mouth daily. 90 tablet 3  . Boswellia-Glucosamine-Vit D (GLUCOSAMINE COMPLEX PO) Take 1,500 mg by mouth every morning.     Marland Kitchen co-enzyme Q-10 30 MG capsule Take 200 mg by mouth daily.     Marland Kitchen diltiazem (CARDIZEM CD) 180 MG 24 hr capsule Take 1 capsule (180 mg total) by mouth daily. 90 capsule 3  . ezetimibe (ZETIA) 10 MG tablet TAKE 1 TABLET BY MOUTH EVERY DAY 90 tablet 3  . LIVALO 2 MG TABS TAKE 1 TABLET BY MOUTH EVERY DAY 90 tablet 3  . metoprolol succinate (TOPROL-XL) 100 MG 24 hr tablet TAKE 1 TABLET (100 MG TOTAL) BY MOUTH EVERY EVENING. TAKE WITH OR IMMEDIATELY FOLLOWING A MEAL. 90 tablet 3  . Multiple Vitamin (MULITIVITAMIN WITH MINERALS) TABS Take 1 tablet by mouth every evening.     . mupirocin ointment (BACTROBAN) 2 % Place 1 application into the nose 2 (two) times daily. 22 g 0  . olmesartan (BENICAR) 20 MG tablet Take 1 tablet (20 mg total) by mouth daily. 90 tablet 1  . Omega 3-6-9 Fatty Acids (OMEGA-3-6-9 PO) Take 1 capsule by mouth every morning. Omega 3= 800mg ,omega 6=380mg ,omega 9 =300mg     . spironolactone (ALDACTONE) 25 MG tablet TAKE 1/2 TABLET DAILY 45 tablet 3  . triamcinolone cream (KENALOG) 0.5 % APPLY 1 APPLICATION TOPICALLY 2 (TWO) TIMES DAILY. TO AFFECTED AREAS. 30 g 3  . vitamin C (ASCORBIC ACID) 500 MG tablet Take 500 mg by mouth every morning.     . zinc gluconate 50 MG tablet Take 50 mg by mouth every evening.     Marland Kitchen Zn-Pyg Afri-Nettle-Saw Palmet (SAW PALMETTO COMPLEX PO) Take 1 capsule by mouth every morning.      No current facility-administered medications on file prior to visit.     PAST MEDICAL HISTORY: Past Medical History:  Diagnosis Date  . Arthritis    hips   . Atrial fibrillation ([place])   . Cataract   . Dysrhythmia    PAF- hx of 7 years ago   . HLD (hyperlipidemia)   . Hypertension 05/29/10   ECHO-EF 67% wnl  . Meniere disease   . Obesity    . Palpitations   . Sleep apnea 09/14/04   [facility]- Dr. Humphrey Rolls; CPAP titration 11/12/04- Dr. Humphrey Rolls    PAST SURGICAL HISTORY: Past Surgical History:  Procedure Laterality Date  . CARDIAC CATHETERIZATION  11/03/05  . CATARACT EXTRACTION  2014  . KNEE ARTHROSCOPY     left knee- 2002   . TOTAL HIP ARTHROPLASTY  06/08/2011   Procedure: TOTAL HIP ARTHROPLASTY ANTERIOR APPROACH;  Surgeon: [name], MD;  Location: [facility];  Service: Orthopedics;  Laterality: Left;    SOCIAL HISTORY: Social History   Tobacco Use  . Smoking status: Never Smoker  . Smokeless tobacco: Never Used  Substance Use Topics  . Alcohol use: No  . Drug use: No    FAMILY HISTORY: Family History  Problem Relation Age of Onset  .  Hypertension Mother   . Stroke Mother   . Diabetes Mother   . Hyperlipidemia Mother   . Obesity Mother   . Diabetes Sister   . Stroke Maternal Grandmother        2 died   . Heart disease Maternal Grandfather   . Hypertension Maternal Grandfather   . Diabetes Maternal Grandfather   . Arthritis Father   . Heart disease Father   . Heart disease Paternal Grandfather     ROS: Review of Systems  Constitutional: Positive for weight loss.  Gastrointestinal: Negative for nausea and vomiting.  Endo/Heme/Allergies:       Negative for hypoglycemia    PHYSICAL EXAM: Pt in no acute distress  RECENT LABS AND TESTS: BMET    Component Value Date/Time   NA 141 08/07/2018 0901   K 4.8 08/07/2018 0901   CL 104 08/07/2018 0901   CO2 21 08/07/2018 0901   GLUCOSE 110 (H) 08/07/2018 0901   GLUCOSE 111 (H) 10/12/2016 0902   BUN 26 08/07/2018 0901   CREATININE 1.44 (H) 08/07/2018 0901   CREATININE 1.32 (H) 10/12/2016 0902   CALCIUM 9.3 08/07/2018 0901   GFRNONAA 49 (L) 08/07/2018 0901   GFRNONAA 54 (L) 10/12/2016 0902   GFRAA 56 (L) 08/07/2018 0901   GFRAA 63 10/12/2016 0902   Lab Results  Component Value Date   HGBA1C 6.0 (H) 11/01/2017   HGBA1C 5.4  04/14/2016   HGBA1C 6.0 06/09/2015   Lab Results  Component Value Date   INSULIN 22.4 11/01/2017   CBC    Component Value Date/Time   WBC 6.6 08/09/2018 1448   WBC 5.4 12/08/2015 0945   RBC 5.15 08/09/2018 1448   RBC 4.87 12/08/2015 0945   HGB 15.7 08/09/2018 1448   HCT 47.5 08/09/2018 1448   PLT 208 08/09/2018 1448   MCV 92 08/09/2018 1448   MCH 30.5 08/09/2018 1448   MCH 31.2 12/08/2015 0945   MCHC 33.1 08/09/2018 1448   MCHC 34.5 12/08/2015 0945   RDW 12.7 08/09/2018 1448   LYMPHSABS 1.5 08/27/2014 1030   MONOABS 0.5 08/27/2014 1030   EOSABS 0.1 08/27/2014 1030   BASOSABS 0.0 08/27/2014 1030   Iron/TIBC/Ferritin/ %Sat No results found for: IRON, TIBC, FERRITIN, IRONPCTSAT Lipid Panel     Component Value Date/Time   CHOL 117 08/07/2018 0901   TRIG 67 08/07/2018 0901   HDL 50 08/07/2018 0901   CHOLHDL 2.3 08/07/2018 0901   CHOLHDL 3.8 10/12/2016 0902   VLDL 17 04/14/2016 0937   LDLCALC 54 08/07/2018 0901   LDLCALC 119 (H) 10/12/2016 0902   Hepatic Function Panel     Component Value Date/Time   PROT 6.5 08/07/2018 0901   ALBUMIN 4.4 08/07/2018 0901   AST 26 08/07/2018 0901   ALT 25 08/07/2018 0901   ALKPHOS 49 08/07/2018 0901   BILITOT 1.3 (H) 08/07/2018 0901      Component Value Date/Time   TSH 1.290 08/09/2018 1448   TSH 1.28 12/08/2015 0945   TSH 0.60 06/09/2015 1056      Ref. Range 11/01/2017 10:52  Vitamin D, 25-Hydroxy Latest Ref Range: 30.0 - 100.0 ng/mL 45.2   I, Doreene Nest, am acting as Location manager for Dennard Nip, MD I have reviewed the above documentation for accuracy and completeness, and I agree with the above. -Dennard Nip, MD

## 2018-08-16 ENCOUNTER — Other Ambulatory Visit: Payer: Self-pay

## 2018-08-16 ENCOUNTER — Ambulatory Visit (HOSPITAL_COMMUNITY): Payer: Medicare Other | Attending: Internal Medicine

## 2018-08-16 DIAGNOSIS — I4819 Other persistent atrial fibrillation: Secondary | ICD-10-CM | POA: Diagnosis present

## 2018-08-19 ENCOUNTER — Other Ambulatory Visit (INDEPENDENT_AMBULATORY_CARE_PROVIDER_SITE_OTHER): Payer: Self-pay | Admitting: Family Medicine

## 2018-08-19 DIAGNOSIS — R7303 Prediabetes: Secondary | ICD-10-CM

## 2018-08-19 NOTE — Assessment & Plan Note (Signed)
Decision today to treat with OMT was based on Physical Exam  After verbal consent patient was treated with HVLA, ME, FPR techniques in  thoracic, lumbar and sacral areas  Patient tolerated the procedure well with improvement in symptoms  Patient given exercises, stretches and lifestyle modifications  See medications in patient instructions if given  Patient will follow up in 4-8 weeks 

## 2018-08-19 NOTE — Progress Notes (Signed)
[name] [address] [place], [place] [address] Phone: [phone] Subjective:   [name], am serving as a scribe for Dr. Hulan Saas.  I'm seeing this patient by the request  of:    CC: Low back pain follow-up  QIO:[id number]  Clayton [name] is a 73 y.o. male coming in with complaint of low back pain.  Patient has had degenerative disc disease for quite some time.  And attempting to lose weight.  Making some progress.  Recently diagnosed with atrial fibrillation again which is caused some mild fatigue.  Patient is trying to get to under 209 pounds by the end of the year.  Percent of 235 pounds.    Past Medical History:  Diagnosis Date  . Arthritis    hips   . Atrial fibrillation ([place])   . Cataract   . Dysrhythmia    PAF- hx of 7 years ago   . HLD (hyperlipidemia)   . Hypertension 05/29/10   ECHO-EF 67% wnl  . Meniere disease   . Obesity   . Palpitations   . Sleep apnea 09/14/04   [facility]- Dr. Humphrey Rolls; CPAP titration 11/12/04- Dr. Humphrey Rolls   Past Surgical History:  Procedure Laterality Date  . CARDIAC CATHETERIZATION  11/03/05  . CATARACT EXTRACTION  2014  . KNEE ARTHROSCOPY     left knee- 2002   . TOTAL HIP ARTHROPLASTY  06/08/2011   Procedure: TOTAL HIP ARTHROPLASTY ANTERIOR APPROACH;  Surgeon: [name], MD;  Location: [facility];  Service: Orthopedics;  Laterality: Left;   Social History   Socioeconomic History  . Marital status: Married    Spouse name: Layne Dilauro  . Number of children: Not on file  . Years of education: Not on file  . Highest education level: Not on file  Occupational History  . Occupation: Retired  Scientific laboratory technician  . Financial resource strain: Not on file  . Food insecurity    Worry: Not on file    Inability: Not on file  . Transportation needs    Medical: Not on file    Non-medical: Not on file  Tobacco Use  . Smoking status: Never Smoker  . Smokeless tobacco: Never Used   Substance and Sexual Activity  . Alcohol use: No  . Drug use: No  . Sexual activity: Yes    Birth control/protection: None  Lifestyle  . Physical activity    Days per week: Not on file    Minutes per session: Not on file  . Stress: Not on file  Relationships  . Social Herbalist on phone: Not on file    Gets together: Not on file    Attends religious service: Not on file    Active member of club or organization: Not on file    Attends meetings of clubs or organizations: Not on file    Relationship status: Not on file  Other Topics Concern  . Not on file  Social History Narrative   Married. [name].Takes care of his mother-in-law as well.    Retired, Conservator, museum/gallery.    Drinks caffeine beverages. Takes a daily vitamin.   Exercises routinely.    Smoke detector in the home.    Ambulates independently.    Allergies  Allergen Reactions  . Statins    Family History  Problem Relation Age of Onset  . Hypertension Mother   . Stroke Mother   . Diabetes Mother   .  Hyperlipidemia Mother   . Obesity Mother   . Diabetes Sister   . Stroke Maternal Grandmother        33 died   . Heart disease Maternal Grandfather   . Hypertension Maternal Grandfather   . Diabetes Maternal Grandfather   . Arthritis Father   . Heart disease Father   . Heart disease Paternal Grandfather     Current Outpatient Medications (Endocrine & Metabolic):  .  metFORMIN (GLUCOPHAGE) 500 MG tablet, Take 1 tablet (500 mg total) by mouth daily with breakfast.  Current Outpatient Medications (Cardiovascular):  .  diltiazem (CARDIZEM CD) 180 MG 24 hr capsule, Take 1 capsule (180 mg total) by mouth daily. Marland Kitchen  ezetimibe (ZETIA) 10 MG tablet, TAKE 1 TABLET BY MOUTH EVERY DAY .  LIVALO 2 MG TABS, TAKE 1 TABLET BY MOUTH EVERY DAY .  metoprolol succinate (TOPROL-XL) 100 MG 24 hr tablet, TAKE 1 TABLET (100 MG TOTAL) BY MOUTH EVERY EVENING. TAKE WITH OR IMMEDIATELY FOLLOWING A MEAL. Marland Kitchen  olmesartan  (BENICAR) 20 MG tablet, Take 1 tablet (20 mg total) by mouth daily. Marland Kitchen  spironolactone (ALDACTONE) 25 MG tablet, TAKE 1/2 TABLET DAILY   Current Outpatient Medications (Analgesics):  .  aspirin EC 81 MG tablet, Take 1 tablet (81 mg total) by mouth daily.  Current Outpatient Medications (Hematological):  .  apixaban (ELIQUIS) 5 MG TABS tablet, Take 1 tablet (5 mg total) by mouth 2 (two) times daily.  Current Outpatient Medications (Other):  Marland Kitchen  Boswellia-Glucosamine-Vit D (GLUCOSAMINE COMPLEX PO), Take 1,500 mg by mouth every morning.  Marland Kitchen  co-enzyme Q-10 30 MG capsule, Take 200 mg by mouth daily.  .  Multiple Vitamin (MULITIVITAMIN WITH MINERALS) TABS, Take 1 tablet by mouth every evening.  .  mupirocin ointment (BACTROBAN) 2 %, Place 1 application into the nose 2 (two) times daily. [name] 3-6-9 Fatty Acids (OMEGA-3-6-9 PO), Take 1 capsule by mouth every morning. Omega 3= 800mg ,omega 6=380mg ,omega 9 =300mg  .  triamcinolone cream (KENALOG) 0.5 %, APPLY 1 APPLICATION TOPICALLY 2 (TWO) TIMES DAILY. TO AFFECTED AREAS. .  vitamin C (ASCORBIC ACID) 500 MG tablet, Take 500 mg by mouth every morning.  .  zinc gluconate 50 MG tablet, Take 50 mg by mouth every evening.  Marland Kitchen  Zn-Pyg Afri-Nettle-Saw Palmet (SAW PALMETTO COMPLEX PO), Take 1 capsule by mouth every morning.     Past medical history, social, surgical and family history all reviewed in electronic medical record.  No pertanent information unless stated regarding to the chief complaint.   Review of Systems:  No headache, visual changes, nausea, vomiting, diarrhea, constipation, dizziness, abdominal pain, skin rash, fevers, chills, night sweats, weight loss, swollen lymph nodes, body aches, joint swelling, chest pain, shortness of breath, mood changes.  Positive muscle aches  Objective  Blood pressure 122/70, pulse 64, height 6\' 3"  (1.905 m), weight 235 lb (106.6 kg), SpO2 98 %.   General: No apparent distress alert and oriented x3 mood and  affect normal, dressed appropriately.  HEENT: Pupils equal, extraocular movements intact  Respiratory: Patient's speak in full sentences and does not appear short of breath  Cardiovascular: No lower extremity edema, non tender, no erythema  Skin: Warm dry intact with no signs of infection or rash on extremities or on axial skeleton.  Abdomen: Soft nontender  Neuro: Cranial nerves II through XII are intact, neurovascularly intact in all extremities with 2+ DTRs and 2+ pulses.  Lymph: No lymphadenopathy of posterior or anterior cervical chain or axillae bilaterally.  Gait normal with good balance and coordination.  MSK:  Non tender with full range of motion and good stability and symmetric strength and tone of shoulders, elbows, wrist, hip, knee and ankles bilaterally.   Low back exam shows some mild degenerative disc disease changes with some loss of lordosis and mild scoliosis.  Patient does have some tightness with Corky Sox but improvement noted.  Negative straight leg test.  5 out of 5 strength of the lower extremities.   Osteopathic findings T6 extended rotated and side bent left L2 flexed rotated and side bent right Sacrum right on right    Impression and Recommendations:     This case required medical decision making of moderate complexity. The above documentation has been reviewed and is accurate and complete [name], DO       Note: This dictation was prepared with Dragon dictation along with smaller phrase technology. Any transcriptional errors that result from this process are unintentional.

## 2018-08-19 NOTE — Assessment & Plan Note (Signed)
Low back pain.  Patient will continue to work on core strengthening monitor weight.  Discussed posture and anomalies.  Follow-up again in 4 to 8 weeks

## 2018-08-21 ENCOUNTER — Ambulatory Visit (INDEPENDENT_AMBULATORY_CARE_PROVIDER_SITE_OTHER): Payer: Medicare Other | Admitting: Family Medicine

## 2018-08-21 ENCOUNTER — Other Ambulatory Visit: Payer: Self-pay

## 2018-08-21 ENCOUNTER — Encounter: Payer: Self-pay | Admitting: Family Medicine

## 2018-08-21 VITALS — BP 122/70 | HR 64 | Ht 75.0 in | Wt 235.0 lb

## 2018-08-21 DIAGNOSIS — M999 Biomechanical lesion, unspecified: Secondary | ICD-10-CM

## 2018-08-21 DIAGNOSIS — M9904 Segmental and somatic dysfunction of sacral region: Secondary | ICD-10-CM

## 2018-08-21 DIAGNOSIS — M545 Low back pain: Secondary | ICD-10-CM

## 2018-08-21 DIAGNOSIS — G8929 Other chronic pain: Secondary | ICD-10-CM

## 2018-08-21 DIAGNOSIS — M9902 Segmental and somatic dysfunction of thoracic region: Secondary | ICD-10-CM | POA: Diagnosis not present

## 2018-08-21 DIAGNOSIS — M9903 Segmental and somatic dysfunction of lumbar region: Secondary | ICD-10-CM

## 2018-08-23 ENCOUNTER — Ambulatory Visit (INDEPENDENT_AMBULATORY_CARE_PROVIDER_SITE_OTHER): Payer: Medicare Other | Admitting: Cardiovascular Disease

## 2018-08-23 ENCOUNTER — Other Ambulatory Visit: Payer: Self-pay

## 2018-08-23 VITALS — BP 140/89 | HR 92 | Temp 97.0°F | Ht 75.0 in | Wt 235.2 lb

## 2018-08-23 DIAGNOSIS — E782 Mixed hyperlipidemia: Secondary | ICD-10-CM

## 2018-08-23 DIAGNOSIS — I4819 Other persistent atrial fibrillation: Secondary | ICD-10-CM

## 2018-08-23 DIAGNOSIS — I10 Essential (primary) hypertension: Secondary | ICD-10-CM

## 2018-08-23 DIAGNOSIS — G4733 Obstructive sleep apnea (adult) (pediatric): Secondary | ICD-10-CM | POA: Diagnosis not present

## 2018-08-23 DIAGNOSIS — Z7901 Long term (current) use of anticoagulants: Secondary | ICD-10-CM

## 2018-08-23 MED ORDER — METOPROLOL SUCCINATE ER 25 MG PO TB24: ORAL_TABLET | ORAL | 1 refills | Status: DC

## 2018-08-23 MED ORDER — METOPROLOL SUCCINATE ER 100 MG PO TB24: ORAL_TABLET | ORAL | 3 refills | Status: DC

## 2018-08-23 NOTE — Progress Notes (Addendum)
Patient ID: Clayton [name], male   DOB: [birth year], 73 y.o.   MRN: [id number]     HPI: Clayton [name] is a 73 y.o. male who presents for a 2 week follow-up cardiology and sleep evaluation.  Clayton [name] has a history of hypertension, a remote history of PAF, status post cardioversion in 2007, obstructive sleep apnea for which he uses CPAP 100% of the time, hypertension, and hyperlipidemia. He has had weight fluctuations over the years.  In the past, he also had a history of Mnire's disease.  There was some concern by [name] that perhaps this may have been related to statin use, which led to its discontinuance.  He is not had Mnire's disease in some time.  He underwent left hip replacement surgery by Dr. [name] and now has been able to continue to be active and exercises routinely.   His  blood pressure regimen has been amlodipine/benazepril 10/40 daily, hydrochlorothiazide has been taking 25 Mill grams in the evening and Toprol-XL 100 mg daily. He had atrial of livalo  For hyperlipidemia but developed some diarrhea secondary to this.    He has not had much success with weight loss over the past several years.    He continues to use his CPAP therapy with 100% compliance.  He will not even take a nap without his CPAP therapy and he takes his CPAP unit on alll his travels.   Since initiating CPAP therapy, he is unaware of any palpitations.  He denies any recurrent atrial fibrillation.  In the past he has had difficulty with family stress and his father had passed away, and his wife's mother recently died after having developed lung CA and had significant PVD.  He was not exercising as much as he had in the past.  He has not been successful in weight loss.  He continues to be active and still walks 3-4 miles per day.  Oftentimes in the morning when he awakens his blood pressure is elevated at 150/90 but after exercise it drops to 128/80.  He is unaware of any recurrent atrial fibrillation.  He  continues to use CPAP with 100% compliance.   He completed a two-year grieving process.  On 01/19/2016 he committed to weight loss.  On January 1, he weighed 278 pounds .  He has been walking 4 miles 5 days per week as well as some intermittent stationary bike.  He has lost a total of 32 pounds since January.  He continues to use CPAP with 100% compliance and since he has been on CPAP, he has not had any recurrent atrial fibrillation and his blood pressure has been less labile.  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  He has had failures to Crestor and Lipitor in the past due to significant myalgias, development, even with weekly dosing.  I resumed zetia   In August 2018 he noticed his heart rate increasing and he presented for evaluation and was seen by [name], PAC.  He admitted to a rare palpitation.  He denied any chest pain with exertion or dyspnea on exertion.  He has suffered a broken rib.  This year.  He has a weight goal of 220 pounds.    I  saw him in September 2018, at which time he was doing well from a cardiovascular standpoint.   His father was ill and ultimately passed away in [place].  As result he was going back and forth to the [facility] area.  This resulted in a change in his diet and ability to exercise.  I last saw him in October 2019 and his weight had increased from 240 back up to 270 pounds.  He was unaware of any episodes of atrial fibrillation.  He was using CPAP with 100% compliance along with his naps.  He was committed to begin weight loss again.    Since I saw him on October 19, 2018 he has had major lifestyle change.  He has been working with Dr. Leafy Ro in the [facility].  Peak weight was 280 and most recent weight 244.  I saw him for a cardiology evaluation on August 09, 2018.  O ver the past 4 months, he had been under increased stress as result of his wife's illness.  He has been checking his blood pressure regularly and this has  been stable but on his blood pressure recording he has noticed the message of heart rate irregularity.  He is unaware of A. fib but has noticed this message for the last 3 to 4 months.  He is exercising every day and typically walks 2 miles in the morning and in the afternoon or evening does 30 minutes of stationary bike at least 5 to 6 days/week.  He continues to use CPAP.  I obtained a download in the office today from June 22 through August 08, 2018.  He is 100% compliant.  He has a ResMed air sense 10 auto unit with a pressure range of 8-20 with 95% pressure at 10.1 and maximum average and 11.5.  AHI is excellent at 1.2.     During his August 09, 2018 encounter his ECG demonstrated that he was back in atrial fibrillation and had a ventricular rate at 89 bpm.  There was LVH with repolarization changes.  At that time, I recommended discontinuance of amlodipine/benazepril combination and in its place started Cardizem CD 180 mg rather than amlodipine and olmesartan 20 mg rather than benazepril.  I initiated anticoagulation with Eliquis 5 mg twice a day.  He underwent a 2D echo Doppler study on August 16, 2018 which showed EF low normal at 50 to 55%.  There was evidence for significant LVH and moderate dilation of his left atrium.  He presents to the office today for reevaluation.  He now is cognizant of his heart rate irregularity.  He has continued to use CPAP with excellent compliance.  A new download was obtained from July 5 through August 21, 2018 which shows 100% compliance with average usage 7 hours and 28 minutes.  AHI is 1.6 and his 95th percentile auto pressure is 10.1 with a maximum average pressure of 11.4.  He states he has lost weight over the past several weeks purposefully.  He continues to exercise now on a stationary bike.  He denies any chest pain PND orthopnea.  He is tolerating Eliquis without bleeding.  He presents for reevaluation.   Past Medical History:  Diagnosis Date   Arthritis     hips    Atrial fibrillation (HCC)    Cataract    Dysrhythmia    PAF- hx of 7 years ago    HLD (hyperlipidemia)    Hypertension 05/29/10   ECHO-EF 67% wnl   Meniere disease    Obesity    Palpitations    Sleep apnea 09/14/04   [facility]- Dr. Humphrey Rolls; CPAP titration 11/12/04- Dr. Humphrey Rolls    Past Surgical History:  Procedure Laterality Date   CARDIAC  CATHETERIZATION  11/03/05   CATARACT EXTRACTION  2014   KNEE ARTHROSCOPY     left knee- 2002    TOTAL HIP ARTHROPLASTY  06/08/2011   Procedure: TOTAL HIP ARTHROPLASTY ANTERIOR APPROACH;  Surgeon: [name], MD;  Location: [facility];  Service: Orthopedics;  Laterality: Left;    Allergies  Allergen Reactions   Statins     Current Outpatient Medications  Medication Sig Dispense Refill   apixaban (ELIQUIS) 5 MG TABS tablet Take 1 tablet (5 mg total) by mouth 2 (two) times daily. 180 tablet 1   aspirin EC 81 MG tablet Take 1 tablet (81 mg total) by mouth daily. 90 tablet 3   Boswellia-Glucosamine-Vit D (GLUCOSAMINE COMPLEX PO) Take 1,500 mg by mouth every morning.      co-enzyme Q-10 30 MG capsule Take 200 mg by mouth daily.      diltiazem (CARDIZEM CD) 180 MG 24 hr capsule Take 1 capsule (180 mg total) by mouth daily. 90 capsule 3   ezetimibe (ZETIA) 10 MG tablet TAKE 1 TABLET BY MOUTH EVERY DAY 90 tablet 3   LIVALO 2 MG TABS TAKE 1 TABLET BY MOUTH EVERY DAY 90 tablet 3   metFORMIN (GLUCOPHAGE) 500 MG tablet Take 1 tablet (500 mg total) by mouth daily with breakfast. 30 tablet 0   metoprolol succinate (TOPROL-XL) 100 MG 24 hr tablet Take 100 mg in the evenings (along with 25 mg or 50 mg in the mornings) 90 tablet 3   Multiple Vitamin (MULITIVITAMIN WITH MINERALS) TABS Take 1 tablet by mouth every evening.      olmesartan (BENICAR) 20 MG tablet Take 1 tablet (20 mg total) by mouth daily. 90 tablet 1   Omega 3-6-9 Fatty Acids (OMEGA-3-6-9 PO) Take 1 capsule by mouth every morning. Omega 3=  842m,omega 6=3885momega 9 =30048m   spironolactone (ALDACTONE) 25 MG tablet TAKE 1/2 TABLET DAILY 45 tablet 3   triamcinolone cream (KENALOG) 0.5 % APPLY 1 APPLICATION TOPICALLY 2 (TWO) TIMES DAILY. TO AFFECTED AREAS. 30 g 3   vitamin C (ASCORBIC ACID) 500 MG tablet Take 500 mg by mouth every morning.      zinc gluconate 50 MG tablet Take 50 mg by mouth every evening.      Zn-Pyg Afri-Nettle-Saw Palmet (SAW PALMETTO COMPLEX PO) Take 1 capsule by mouth every morning.      metoprolol succinate (TOPROL XL) 25 MG 24 hr tablet Take 25 mg in the morning (along with 100 mg in the evening) for 5 days- then if pulse still in 70's increase to 50 mg in the morning (along with 100 mg in the evening) 90 tablet 1   No current facility-administered medications for this visit.     Socially he is remarried after his first wife passed away. He has 2 children. 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 is no tobacco history.  He plays golf at least 2 days/week and this past year not only did he have a hole in 1 but he has also shot his age.  ROS General: Negative; No fevers, chills, or night sweats;  HEENT: Negative; No changes in vision or hearing, sinus congestion, difficulty swallowing Pulmonary: Negative; No cough, wheezing, shortness of breath, hemoptysis Cardiovascular: See HPI GI: Negative; No nausea, vomiting, diarrhea, or abdominal pain GU: Negative; No dysuria, hematuria, or difficulty voiding Musculoskeletal: He is status post rib fracture.  He is status post hip replacement. Hematologic/Oncology: Negative; no easy bruising, bleeding Endocrine: Negative; no  heat/cold intolerance; no diabetes Neuro: No vertigo or imbalance issues Skin: Negative; No rashes or skin lesions Psychiatric: Negative; No behavioral problems, depression Sleep: Positive for obstructive sleep apnea on CPAP therapy with 100% compliance No snoring, daytime sleepiness, hypersomnolence, bruxism,  restless legs, hypnogognic hallucinations, no cataplexy Other comprehensive 14 point system review is negative.  PE BP 140/89    Pulse 92    Temp (!) 97 F (36.1 C)    Ht 6' 3"  (1.905 m)    Wt 235 lb 3.2 oz (106.7 kg)    SpO2 98%    BMI 29.40 kg/m    Repeat blood pressure by me was 138/88.  Wt Readings from Last 3 Encounters:  08/23/18 235 lb 3.2 oz (106.7 kg)  08/21/18 235 lb (106.6 kg)  08/09/18 244 lb 9.6 oz (110.9 kg)   General: Alert, oriented, no distress.  Skin: normal turgor, no rashes, warm and dry HEENT: Normocephalic, atraumatic. Pupils equal round and reactive to light; sclera anicteric; extraocular muscles intact;  Nose without nasal septal hypertrophy Mouth/Parynx benign; Mallinpatti scale 3  Neck: No JVD, no carotid bruits; normal carotid upstroke Lungs: clear to ausculatation and percussion; no wheezing or rales Chest wall: without tenderness to palpitation Heart: PMI not displaced, irregularly irregular with a ventricular rate around 90, s1 s2 normal, 1/6 systolic murmur, no diastolic murmur, no rubs, gallops, thrills, or heaves Abdomen: soft, nontender; no hepatosplenomehaly, BS+; abdominal aorta nontender and not dilated by palpation. Back: no CVA tenderness Pulses 2+ Musculoskeletal: full range of motion, normal strength, no joint deformities Extremities: no clubbing cyanosis or edema, Homan's sign negative  Neurologic: grossly nonfocal; Cranial nerves grossly wnl Psychologic: Normal mood and affect  ECG (independently read by me): Atrial fibrillation at 96 bpm.  LVH with repolarization changes.  Left axis deviation.  Left anterior hemiblock.  Inferior Q waves  August 09, 2018 ECG (independently read by me):Atrial fibrillatoion at 85; LVH with repolarization  October 219 ECG (independently read by me): Normal sinus rhythm at 81 bpm.  Nonspecific intraventricular conduction delay.  Left axis deviation  September 2018 ECG (independently read by me): Normal sinus  rhythm at 63 bpm.  Left bundle branch block with repolarization changes.  Normal intervals.  No ectopy.  April 2018 ECG (independently read by me): Normal sinus rhythm at 68 bpm.  LVH with repolarization.  Normal intervals.  November 2017 ECG (independently read by me): Normal sinus rhythm at 78 bpm.  Nonspecific interventricular delay.  QRS complex V1 V2.  August 2016 ECG (independently read by me): Normal sinus rhythm at 77 bpm.  LVH with repolarization changes.  Left axis deviation.  December 2015 ECG (independently read by me): Normal sinus rhythm at 87 bpm.  LVH by voltage criteria with T-wave changes in leads 1 and aVL.  July 2014 ECG: Normal sinus rhythm at 76 beats per minute. LVH with QRS widening.  LABS:  BMP Latest Ref Rng & Units 08/07/2018 10/17/2017 10/12/2016  Glucose 65 - 99 mg/dL 110(H) 108(H) 111(H)  BUN 8 - 27 mg/dL 26 25 24   Creatinine 0.76 - 1.27 mg/dL 1.44(H) 1.40(H) 1.32(H)  BUN/Creat Ratio 10 - 24 18 18 18   Sodium 134 - 144 mmol/L 141 140 138  Potassium 3.5 - 5.2 mmol/L 4.8 4.5 4.5  Chloride 96 - 106 mmol/L 104 104 104  CO2 20 - 29 mmol/L 21 20 28   Calcium 8.6 - 10.2 mg/dL 9.3 9.5 9.4   Hepatic Function Latest Ref Rng & Units 08/07/2018 10/17/2017 10/12/2016  Total Protein 6.0 - 8.5 g/dL 6.5 6.8 6.7  Albumin 3.7 - 4.7 g/dL 4.4 4.6 -  AST 0 - 40 IU/L 26 36 21  ALT 0 - 44 IU/L 25 45(H) 23  Alk Phosphatase 39 - 117 IU/L 49 59 -  Total Bilirubin 0.0 - 1.2 mg/dL 1.3(H) 1.1 1.0   CBC Latest Ref Rng & Units 08/09/2018 12/08/2015 08/27/2014  WBC 3.4 - 10.8 x10E3/uL 6.6 5.4 5.0  Hemoglobin 13.0 - 17.7 g/dL 15.7 15.2 15.3  Hematocrit 37.5 - 51.0 % 47.5 44.1 44.1  Platelets 150 - 450 x10E3/uL 208 194 199   Lab Results  Component Value Date   MCV 92 08/09/2018   MCV 90.6 12/08/2015   MCV 88.4 08/27/2014   Lab Results  Component Value Date   TSH 1.290 08/09/2018    Lab Results  Component Value Date   HGBA1C 6.0 (H) 11/01/2017    Lipid Panel     Component Value  Date/Time   CHOL 117 08/07/2018 0901   TRIG 67 08/07/2018 0901   HDL 50 08/07/2018 0901   CHOLHDL 2.3 08/07/2018 0901   CHOLHDL 3.8 10/12/2016 0902   VLDL 17 04/14/2016 0937   LDLCALC 54 08/07/2018 0901   LDLCALC 119 (H) 10/12/2016 0902     RADIOLOGY: No results found.  IMPRESSION:  1. Persistent atrial fibrillation   2. Essential hypertension   3. OSA (obstructive sleep apnea)   4. Mixed hyperlipidemia   5. Anticoagulated      ASSESSMENT AND PLAN: Mr [name] is a 73 year old gentleman who has a long-standing history of hypertension and had been on amlodipine/benazepril 10/40, HCTZ 25 mg, Toprol-XL 100 mg in addition to spironolactone 12.5 mg daily.  On this regimen his blood pressure had stabilized and consistently was well controlled.   He had gained significant amount of weight since August 2019 he initiated a major  lifestyle change and has been working closely with Dr. Leafy Ro from [facility].  He has lost approximately 36 pounds since his last evaluation.  For the previous 4 months, he was under significantly increased stress with the illness of his wife.  When I saw him 2 weeks ago ECG confirmed that he was back in atrial fibrillation.  He had been noticing that when he would have his blood pressure be checked with his monitor he would get a message that his heart rate was irregular and I suspect that that he was in atrial fibrillation for the past 3 to 4 months duration.  He had previously been in atrial fibrillation in 2007 and had 13 years without recurrence.  He has continued to use CPAP with excellent results and benefit and essentially cannot sleep without it.  His AHI has been excellent.  Over the past several weeks he has been on an adjusted medical regimen consisting of Cardizem CD 180 mg, olmesartan 20 mg, spironolactone 12.5 mg in addition to metoprolol succinate 100 mg daily.  ECG today again confirms he is still in atrial fibrillation.  Blood  pressure is mildly increased with stage I hypertension by most recent guidelines.  I am recommending additional titration of metoprolol succinate and he will continue to take 100 mg at night but in the morning for the next 4 to 5 days he will take 25 mg and if after 5 days he notes his pulse is still in the 80s or 90s he will further titrate and take 50 mg in the morning and 100 mg in  the evening.  I reviewed his echo Doppler study which shows low normal EF at 50 to 55%.  Left atrium was moderately dilated at 4.5 cm.  He is now on Eliquis and has been on therapy for 2 weeks.  I discussed with him that he would need to be fully anticoagulated for a month prior to any potential attempt at car cardioversion.  Since this is his only recurrence of A. fib over the past 13 years, I would not initiate antiarrhythmic therapy presently but will try increased rate control medications.  I will see him in follow-up in 3 to 4 weeks.  If he is still in A. fib at that time plans will be made for outpatient DC cardioversion.  I reviewed again his previous laboratory.  He continues to be on Livalo and Zetia for hyperlipidemia and has been intolerant to other statins.  Most recent LDL cholesterol was excellent at 54.  Recent magnesium, CBC and thyroid function studies were normal which were checked after his last office visit in light of his AF.  Time spent: 25 minutes [name], MD, [facility]  08/24/2018 4:44 PM

## 2018-08-23 NOTE — Patient Instructions (Signed)
Medication Instructions:  For 5 days increase Metoprolol to 25 mg in the AM, and 100 mg in the PM, after 5 days if pulse is still in the 70's- increase to 50 in the AM, and 100 mg in the PM.  If you need a refill on your cardiac medications before your next appointment, please call your pharmacy.   Follow-Up: At [facility], you and your health needs are our priority.  As part of our continuing mission to provide you with exceptional heart care, we have created designated Provider Care Teams.  These Care Teams include your primary Cardiologist (physician) and Advanced Practice Providers (APPs -  Physician Assistants and Nurse Practitioners) who all work together to provide you with the care you need, when you need it. . Follow up with Dr.Kelly on August 28th at 3:40.

## 2018-08-24 ENCOUNTER — Encounter: Payer: Self-pay | Admitting: Cardiovascular Disease

## 2018-08-24 NOTE — Addendum Note (Signed)
Addended by: Shelva Majestic A on: 08/24/2018 04:45 PM   Modules accepted: Level of Service

## 2018-08-28 ENCOUNTER — Encounter (INDEPENDENT_AMBULATORY_CARE_PROVIDER_SITE_OTHER): Payer: Self-pay | Admitting: Family Medicine

## 2018-08-28 NOTE — Telephone Encounter (Signed)
Please advise 

## 2018-09-04 ENCOUNTER — Ambulatory Visit (INDEPENDENT_AMBULATORY_CARE_PROVIDER_SITE_OTHER): Payer: Medicare Other | Admitting: Family Medicine

## 2018-09-04 ENCOUNTER — Encounter (INDEPENDENT_AMBULATORY_CARE_PROVIDER_SITE_OTHER): Payer: Self-pay | Admitting: Family Medicine

## 2018-09-04 ENCOUNTER — Other Ambulatory Visit: Payer: Self-pay

## 2018-09-04 VITALS — BP 132/86 | HR 52 | Temp 97.8°F | Ht 73.0 in | Wt 230.0 lb

## 2018-09-04 DIAGNOSIS — R7303 Prediabetes: Secondary | ICD-10-CM

## 2018-09-04 DIAGNOSIS — E669 Obesity, unspecified: Secondary | ICD-10-CM | POA: Diagnosis not present

## 2018-09-04 DIAGNOSIS — E559 Vitamin D deficiency, unspecified: Secondary | ICD-10-CM

## 2018-09-04 DIAGNOSIS — I4891 Unspecified atrial fibrillation: Secondary | ICD-10-CM | POA: Diagnosis not present

## 2018-09-04 DIAGNOSIS — Z6830 Body mass index (BMI) 30.0-30.9, adult: Secondary | ICD-10-CM

## 2018-09-04 MED ORDER — METFORMIN HCL 500 MG PO TABS: 500.0000 mg | ORAL_TABLET | Freq: Every day | ORAL | 0 refills | Status: DC

## 2018-09-05 LAB — COMPREHENSIVE METABOLIC PANEL
ALT: 26 IU/L (ref 0–44)
AST: 25 IU/L (ref 0–40)
Albumin/Globulin Ratio: 2 (ref 1.2–2.2)
Albumin: 4.5 g/dL (ref 3.7–4.7)
Alkaline Phosphatase: 50 IU/L (ref 39–117)
BUN/Creatinine Ratio: 20 (ref 10–24)
BUN: 28 mg/dL — ABNORMAL HIGH (ref 8–27)
Bilirubin Total: 1 mg/dL (ref 0.0–1.2)
CO2: 20 mmol/L (ref 20–29)
Calcium: 9.9 mg/dL (ref 8.6–10.2)
Chloride: 106 mmol/L (ref 96–106)
Creatinine, Ser: 1.43 mg/dL — ABNORMAL HIGH (ref 0.76–1.27)
GFR calc Af Amer: 56 mL/min/{1.73_m2} — ABNORMAL LOW (ref >59)
GFR calc non Af Amer: 49 mL/min/{1.73_m2} — ABNORMAL LOW (ref >59)
Globulin, Total: 2.2 g/dL (ref 1.5–4.5)
Glucose: 115 mg/dL — ABNORMAL HIGH (ref 65–99)
Potassium: 4.6 mmol/L (ref 3.5–5.2)
Sodium: 139 mmol/L (ref 134–144)
Total Protein: 6.7 g/dL (ref 6.0–8.5)

## 2018-09-05 LAB — INSULIN, RANDOM: INSULIN: 9 u[IU]/mL (ref 2.6–24.9)

## 2018-09-05 LAB — HEMOGLOBIN A1C
Est. average glucose Bld gHb Est-mCnc: 120 mg/dL
Hgb A1c MFr Bld: 5.8 % — ABNORMAL HIGH (ref 4.8–5.6)

## 2018-09-05 LAB — VITAMIN D 25 HYDROXY (VIT D DEFICIENCY, FRACTURES): Vit D, 25-Hydroxy: 43.2 ng/mL (ref 30.0–100.0)

## 2018-09-05 NOTE — Progress Notes (Signed)
Office: [phone]  /  Fax: [phone]   HPI:   Chief Complaint: OBESITY Clayton [name] is here to discuss his progress with his obesity treatment plan. He is on the  follow a lower carbohydrate, vegetable and lean protein rich diet plan and is following his eating plan approximately 90-95 % of the time. He states he is exercising by stationary bike, walking for 90 minutes 4-6 times per week. Clayton [name]'s last in office visit was about 5 months ago due to COVID-19 isolation he has done very well during this time and has lost another 13 lbs since then. He has done well with is low carb plan.  His weight is 230 lb (104.3 kg) today and has had a weight loss of 13 pounds over a period of 5 months since his last visit. He has lost 36 lbs since starting treatment with [name].  Pre-Diabetes Clayton [name] has a diagnosis of prediabetes based on his elevated HgA1c and was informed this puts him at greater risk of developing diabetes. He is taking metformin currently and continues to work on diet and exercise to decrease risk of diabetes. He denies nausea, vomiting, or hypoglycemia.  Atrial Fibrillation  Clayton [name] has a diagnosis of A-fib. He is followed by Cardiology and states it is resolved after recent cardioversion. He is stable on medications.   Vitamin D deficiency Clayton [name] has a diagnosis of vitamin D deficiency. He is currently taking vit D1500 U daily over-the-counter and denies nausea, vomiting or muscle weakness. He is due for labs.   ASSESSMENT AND PLAN:  Prediabetes - Plan: Comprehensive metabolic panel, Hemoglobin A1c, Insulin, random, metFORMIN (GLUCOPHAGE) 500 MG tablet  Atrial fibrillation, unspecified type ([place])  Vitamin D deficiency - Plan: VITAMIN D 25 Hydroxy (Vit-D Deficiency, Fractures)  Class 1 obesity with serious comorbidity and body mass index (BMI) of 30.0 to 30.9 in adult, unspecified obesity type  PLAN: Pre-Diabetes Clayton [name] will continue to work on weight loss, exercise, and decreasing  simple carbohydrates in his diet to help decrease the risk of diabetes. We dicussed metformin including benefits and risks. He was informed that eating too many simple carbohydrates or too many calories at one sitting increases the likelihood of GI side effects. Clayton [name] will continue metformin 500 mg daily #30 with no refills. We will repeat labs today.  Clayton [name] agreed to follow up with [name] as directed to monitor his progress.  Atrial Fibrillation Discussed A-fib with Clayton [name]. He agrees to continue with diet and exercise to help decrease chance of exacerbation. Agrees to follow up with our clinic as directed.   Vitamin D Deficiency Clayton [name] was informed that low vitamin D levels contributes to fatigue and are associated with obesity, breast, and colon cancer. He agrees to continue to take Vit D daily and will follow up for routine testing of vitamin D, at least 2-3 times per year. He was informed of the risk of over-replacement of vitamin D and agrees to not increase his dose unless he discusses this with [name] first. We will repeat labs today. Agrees to follow up with our clinic as directed.   Obesity Clayton [name] is currently in the action stage of change. As such, his goal is to continue with weight loss efforts He has agreed to follow a lower carbohydrate, vegetable and lean protein rich diet plan Clayton [name] has been instructed to work up to a goal of 150 minutes of combined cardio and strengthening exercise per week for weight loss and overall health benefits. We discussed the following Behavioral Modification Stratagies  today: increasing lean protein intake and decreasing simple carbohydrates   Clayton [name] has agreed to follow up with our clinic in 4 weeks. He was informed of the importance of frequent follow up visits to maximize his success with intensive lifestyle modifications for his multiple health conditions.  ALLERGIES: Allergies  Allergen Reactions  . Statins     MEDICATIONS: Current Outpatient  Medications on File Prior to Visit  Medication Sig Dispense Refill  . apixaban (ELIQUIS) 5 MG TABS tablet Take 1 tablet (5 mg total) by mouth 2 (two) times daily. 180 tablet 1  . aspirin EC 81 MG tablet Take 1 tablet (81 mg total) by mouth daily. 90 tablet 3  . [place]-Glucosamine-Vit D (GLUCOSAMINE COMPLEX PO) Take 1,500 mg by mouth every morning.     Marland Kitchen co-enzyme Q-10 30 MG capsule Take 200 mg by mouth daily.     Marland Kitchen diltiazem (CARDIZEM CD) 180 MG 24 hr capsule Take 1 capsule (180 mg total) by mouth daily. 90 capsule 3  . ezetimibe (ZETIA) 10 MG tablet TAKE 1 TABLET BY MOUTH EVERY DAY 90 tablet 3  . LIVALO 2 MG TABS TAKE 1 TABLET BY MOUTH EVERY DAY 90 tablet 3  . metoprolol succinate (TOPROL XL) 25 MG 24 hr tablet Take 25 mg in the morning (along with 100 mg in the evening) for 5 days- then if pulse still in 70's increase to 50 mg in the morning (along with 100 mg in the evening) 90 tablet 1  . metoprolol succinate (TOPROL-XL) 100 MG 24 hr tablet Take 100 mg in the evenings (along with 25 mg or 50 mg in the mornings) 90 tablet 3  . Multiple Vitamin (MULITIVITAMIN WITH MINERALS) TABS Take 1 tablet by mouth every evening.     . olmesartan (BENICAR) 20 MG tablet Take 1 tablet (20 mg total) by mouth daily. 90 tablet 1  . Omega 3-6-9 Fatty Acids (OMEGA-3-6-9 PO) Take 1 capsule by mouth every morning. Omega 3= 800mg ,omega 6=380mg ,omega 9 =300mg     . spironolactone (ALDACTONE) 25 MG tablet TAKE 1/2 TABLET DAILY 45 tablet 3  . triamcinolone cream (KENALOG) 0.5 % APPLY 1 APPLICATION TOPICALLY 2 (TWO) TIMES DAILY. TO AFFECTED AREAS. 30 g 3  . vitamin C (ASCORBIC ACID) 500 MG tablet Take 500 mg by mouth every morning.     . zinc gluconate 50 MG tablet Take 50 mg by mouth every evening.     Marland Kitchen Zn-Pyg Afri-Nettle-Saw Palmet (SAW PALMETTO COMPLEX PO) Take 1 capsule by mouth every morning.      No current facility-administered medications on file prior to visit.     PAST MEDICAL HISTORY: Past Medical  History:  Diagnosis Date  . Arthritis    hips   . Atrial fibrillation ([place])   . Cataract   . Dysrhythmia    PAF- hx of 7 years ago   . HLD (hyperlipidemia)   . Hypertension 05/29/10   ECHO-EF 67% wnl  . Meniere disease   . Obesity   . Palpitations   . Sleep apnea 09/14/04   [facility]- Dr. Humphrey Rolls; CPAP titration 11/12/04- Dr. Humphrey Rolls    PAST SURGICAL HISTORY: Past Surgical History:  Procedure Laterality Date  . CARDIAC CATHETERIZATION  11/03/05  . CATARACT EXTRACTION  2014  . KNEE ARTHROSCOPY     left knee- 2002   . TOTAL HIP ARTHROPLASTY  06/08/2011   Procedure: TOTAL HIP ARTHROPLASTY ANTERIOR APPROACH;  Surgeon: [name], MD;  Location: [facility];  Service: Orthopedics;  Laterality: Left;    SOCIAL HISTORY: Social History   Tobacco Use  . Smoking status: Never Smoker  . Smokeless tobacco: Never Used  Substance Use Topics  . Alcohol use: No  . Drug use: No    FAMILY HISTORY: Family History  Problem Relation Age of Onset  . Hypertension Mother   . Stroke Mother   . Diabetes Mother   . Hyperlipidemia Mother   . Obesity Mother   . Diabetes Sister   . Stroke Maternal Grandmother        18 died   . Heart disease Maternal Grandfather   . Hypertension Maternal Grandfather   . Diabetes Maternal Grandfather   . Arthritis Father   . Heart disease Father   . Heart disease Paternal Grandfather     ROS: Review of Systems  Constitutional: Positive for weight loss.  Gastrointestinal: Negative for nausea and vomiting.  Musculoskeletal:       Negative for muscle weakness  Endo/Heme/Allergies:       Negative for hypoglycemia    PHYSICAL EXAM: Blood pressure 132/86, pulse (!) 52, temperature 97.8 F (36.6 C), temperature source Oral, height 6\' 1"  (1.854 m), weight 230 lb (104.3 kg), SpO2 98 %. Body mass index is 30.34 kg/m. Physical Exam Vitals signs reviewed.  Constitutional:      Appearance: Normal appearance. He is obese.  HENT:     Head:  Normocephalic.     Nose: Nose normal.  Neck:     Musculoskeletal: Normal range of motion.  Cardiovascular:     Rate and Rhythm: Normal rate.  Pulmonary:     Effort: Pulmonary effort is normal.  Musculoskeletal: Normal range of motion.  Skin:    General: Skin is warm and dry.  Neurological:     Mental Status: He is alert and oriented to person, place, and time.  Psychiatric:        Mood and Affect: Mood normal.        Behavior: Behavior normal.     RECENT LABS AND TESTS: BMET    Component Value Date/Time   NA 139 09/04/2018 0902   K 4.6 09/04/2018 0902   CL 106 09/04/2018 0902   CO2 20 09/04/2018 0902   GLUCOSE 115 (H) 09/04/2018 0902   GLUCOSE 111 (H) 10/12/2016 0902   BUN 28 (H) 09/04/2018 0902   CREATININE 1.43 (H) 09/04/2018 0902   CREATININE 1.32 (H) 10/12/2016 0902   CALCIUM 9.9 09/04/2018 0902   GFRNONAA 49 (L) 09/04/2018 0902   GFRNONAA 54 (L) 10/12/2016 0902   GFRAA 56 (L) 09/04/2018 0902   GFRAA 63 10/12/2016 0902   Lab Results  Component Value Date   HGBA1C 5.8 (H) 09/04/2018   HGBA1C 6.0 (H) 11/01/2017   HGBA1C 5.4 04/14/2016   HGBA1C 6.0 06/09/2015   Lab Results  Component Value Date   INSULIN 9.0 09/04/2018   INSULIN 22.4 11/01/2017   CBC    Component Value Date/Time   WBC 6.6 08/09/2018 1448   WBC 5.4 12/08/2015 0945   RBC 5.15 08/09/2018 1448   RBC 4.87 12/08/2015 0945   HGB 15.7 08/09/2018 1448   HCT 47.5 08/09/2018 1448   PLT 208 08/09/2018 1448   MCV 92 08/09/2018 1448   MCH 30.5 08/09/2018 1448   MCH 31.2 12/08/2015 0945   MCHC 33.1 08/09/2018 1448   MCHC 34.5 12/08/2015 0945   RDW 12.7 08/09/2018 1448   LYMPHSABS 1.5 08/27/2014 1030   MONOABS 0.5 08/27/2014 1030   EOSABS 0.1 08/27/2014  1030   BASOSABS 0.0 08/27/2014 1030   Iron/TIBC/Ferritin/ %Sat No results found for: IRON, TIBC, FERRITIN, IRONPCTSAT Lipid Panel     Component Value Date/Time   CHOL 117 08/07/2018 0901   TRIG 67 08/07/2018 0901   HDL 50 08/07/2018 0901    CHOLHDL 2.3 08/07/2018 0901   CHOLHDL 3.8 10/12/2016 0902   VLDL 17 04/14/2016 0937   LDLCALC 54 08/07/2018 0901   LDLCALC 119 (H) 10/12/2016 0902   Hepatic Function Panel     Component Value Date/Time   PROT 6.7 09/04/2018 0902   ALBUMIN 4.5 09/04/2018 0902   AST 25 09/04/2018 0902   ALT 26 09/04/2018 0902   ALKPHOS 50 09/04/2018 0902   BILITOT 1.0 09/04/2018 0902      Component Value Date/Time   TSH 1.290 08/09/2018 1448   TSH 1.28 12/08/2015 0945   TSH 0.60 06/09/2015 1056     Ref. Range 09/04/2018 09:02  Vitamin D, 25-Hydroxy Latest Ref Range: 30.0 - 100.0 ng/mL 43.2     OBESITY BEHAVIORAL INTERVENTION VISIT  Today's visit was # 12   Starting weight: 266 lbs Starting date: 11/01/17 Today's weight : Weight: 230 lb (104.3 kg)  Today's date:09/04/18 Total lbs lost to date: 36 lbs At least 15 minutes were spent on discussing the following behavioral intervention visit.   ASK: We discussed the diagnosis of obesity with Clayton [name] today and Clayton [name] agreed to give [name] permission to discuss obesity behavioral modification therapy today.  ASSESS: [place] has the diagnosis of obesity and his BMI today is 30.35 Clayton [name] is in the action stage of change   ADVISE: Clayton [name] was educated on the multiple health risks of obesity as well as the benefit of weight loss to improve his health. He was advised of the need for long term treatment and the importance of lifestyle modifications to improve his current health and to decrease his risk of future health problems.  AGREE: Multiple dietary modification options and treatment options were discussed and  Clayton [name] agreed to follow the recommendations documented in the above note.  ARRANGE: Clayton [name] was educated on the importance of frequent visits to treat obesity as outlined per CMS and USPSTF guidelines and agreed to schedule his next follow up appointment today.  I, Renee Ramus, am acting as transcriptionist for Dennard Nip, MD   I have reviewed the above documentation for accuracy and completeness, and I agree with the above. -Dennard Nip, MD

## 2018-09-12 ENCOUNTER — Telehealth: Payer: Self-pay

## 2018-09-12 ENCOUNTER — Other Ambulatory Visit (INDEPENDENT_AMBULATORY_CARE_PROVIDER_SITE_OTHER): Payer: Self-pay | Admitting: Family Medicine

## 2018-09-12 ENCOUNTER — Encounter: Payer: Self-pay | Admitting: Family Medicine

## 2018-09-12 DIAGNOSIS — R7303 Prediabetes: Secondary | ICD-10-CM

## 2018-09-12 NOTE — Telephone Encounter (Signed)
Called patient and lvm to call back so we can schedule his AMW visit with our health coach.

## 2018-09-13 ENCOUNTER — Ambulatory Visit: Payer: Medicare Other | Admitting: Cardiovascular Disease

## 2018-09-15 ENCOUNTER — Ambulatory Visit (INDEPENDENT_AMBULATORY_CARE_PROVIDER_SITE_OTHER): Payer: Medicare Other | Admitting: Cardiovascular Disease

## 2018-09-15 ENCOUNTER — Encounter: Payer: Self-pay | Admitting: Cardiovascular Disease

## 2018-09-15 ENCOUNTER — Other Ambulatory Visit: Payer: Self-pay

## 2018-09-15 VITALS — BP 159/99 | HR 92 | Ht 73.0 in | Wt 231.0 lb

## 2018-09-15 DIAGNOSIS — Z7901 Long term (current) use of anticoagulants: Secondary | ICD-10-CM

## 2018-09-15 DIAGNOSIS — G4733 Obstructive sleep apnea (adult) (pediatric): Secondary | ICD-10-CM

## 2018-09-15 DIAGNOSIS — E669 Obesity, unspecified: Secondary | ICD-10-CM

## 2018-09-15 DIAGNOSIS — I10 Essential (primary) hypertension: Secondary | ICD-10-CM

## 2018-09-15 DIAGNOSIS — I4819 Other persistent atrial fibrillation: Secondary | ICD-10-CM | POA: Diagnosis not present

## 2018-09-15 DIAGNOSIS — E782 Mixed hyperlipidemia: Secondary | ICD-10-CM

## 2018-09-15 DIAGNOSIS — Z9989 Dependence on other enabling machines and devices: Secondary | ICD-10-CM

## 2018-09-15 NOTE — H&P (View-Only) (Signed)
Patient ID: Clayton [name], male   DOB: [birth year], 73 y.o.   MRN: [id number]     HPI: Clayton [name] is a 73 y.o. male who presents for a 4 week follow-up cardiology and sleep evaluation.  Clayton [name] has a history of hypertension, a remote history of PAF, status post cardioversion in 2007, obstructive sleep apnea for which he uses CPAP 100% of the time, hypertension, and hyperlipidemia. He has had weight fluctuations over the years.  In the past, he also had a history of Mnire's disease.  There was some concern by Clayton [name] that perhaps this may have been related to statin use, which led to its discontinuance.  He is not had Mnire's disease in some time.  He underwent left hip replacement surgery by Clayton [name] and now has been able to continue to be active and exercises routinely.   His  blood pressure regimen has been amlodipine/benazepril 10/40 daily, hydrochlorothiazide has been taking 25 Mill grams in the evening and Toprol-XL 100 mg daily. He had atrial of livalo  For hyperlipidemia but developed some diarrhea secondary to this.    He has not had much success with weight loss over the past several years.    He continues to use his CPAP therapy with 100% compliance.  He will not even take a nap without his CPAP therapy and he takes his CPAP unit on alll his travels.   Since initiating CPAP therapy, he is unaware of any palpitations.  He denies any recurrent atrial fibrillation.  In the past he has had difficulty with family stress and his father had passed away, and his wife's mother recently died after having developed lung CA and had significant PVD.  He was not exercising as much as he had in the past.  He has not been successful in weight loss.  He continues to be active and still walks 3-4 miles per day.  Oftentimes in the morning when he awakens his blood pressure is elevated at 150/90 but after exercise it drops to 128/80.  He is unaware of any recurrent atrial fibrillation.  He  continues to use CPAP with 100% compliance.   He completed a two-year grieving process.  On 01/19/2016 he committed to weight loss.  On January 1, he weighed 278 pounds .  He has been walking 4 miles 5 days per week as well as some intermittent stationary bike.  He has lost a total of 32 pounds since January.  He continues to use CPAP with 100% compliance and since he has been on CPAP, he has not had any recurrent atrial fibrillation and his blood pressure has been less labile.  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  He has had failures to Crestor and Lipitor in the past due to significant myalgias, development, even with weekly dosing.  I resumed zetia   In August 2018 he noticed his heart rate increasing and he presented for evaluation and was seen by Clayton [name], Clayton [name].  He admitted to a rare palpitation.  He denied any chest pain with exertion or dyspnea on exertion.  He has suffered a broken rib.  This year.  He has a weight goal of 220 pounds.    I  saw him in September 2018, at which time he was doing well from a cardiovascular standpoint.   His father was ill and ultimately passed away in [place].  As result he was going back and forth to the [facility] area.  This resulted in a change in his diet and ability to exercise.  I last saw him in October 2019 and his weight had increased from 240 back up to 270 pounds.  He was unaware of any episodes of atrial fibrillation.  He was using CPAP with 100% compliance along with his naps.  He was committed to begin weight loss again.    Since I saw him on October 19, 2018 he has had major lifestyle change.  He has been working with Clayton [name] in the healthy weight and wellness group.  Peak weight was 280 and most recent weight 244.  I saw him for a cardiology evaluation on August 09, 2018.  O ver the past 4 months, he had been under increased stress as result of his wife's illness.  He has been checking his blood pressure regularly and this has  been stable but on his blood pressure recording he has noticed the message of heart rate irregularity.  He is unaware of A. fib but has noticed this message for the last 3 to 4 months.  He is exercising every day and typically walks 2 miles in the morning and in the afternoon or evening does 30 minutes of stationary bike at least 5 to 6 days/week.  He continues to use CPAP.  I obtained a download in the office today from June 22 through August 08, 2018.  He is 100% compliant.  He has a ResMed air sense 10 auto unit with a pressure range of 8-20 with 95% pressure at 10.1 and maximum average and 11.5.  AHI is excellent at 1.2.     During his August 09, 2018 encounter his ECG demonstrated that he was back in atrial fibrillation and had a ventricular rate at 89 bpm.  There was LVH with repolarization changes.  At that time, I recommended discontinuance of amlodipine/benazepril combination and in its place started Cardizem CD 180 mg rather than amlodipine and olmesartan 20 mg rather than benazepril.  I initiated anticoagulation with Eliquis 5 mg twice a day.  He underwent a 2D echo Doppler study on August 16, 2018 which showed EF low normal at 50 to 55%.  There was evidence for significant LVH and moderate dilation of his left atrium.  I saw him for follow-up evaluation on August 23, 2018.  At that time he was now cognizant of his heart rate irregularity.  He  He has continued to use CPAP with excellent compliance.  A new download was obtained from July 5 through August 21, 2018 which shows 100% compliance with average usage 7 hours and 28 minutes.  AHI is 1.6 and his 95th percentile auto pressure is 10.1 with a maximum average pressure of 11.4.  He states he has lost weight over the past several weeks purposefully.  He continues to exercise now on a stationary bike.  He denies any chest pain PND orthopnea.  He was tolerating Eliquis without bleeding.  During that evaluation, we discussed different options including  several additional weeks of increased weight control versus initiating antiarrhythmic therapy.  Since his only other episode of atrial fibrillation previously occurred 13 years ago and he had not had any recurrence until this year he opted for an increased rate control trial.  As result metoprolol dose was further titrated.  Over the past several weeks he remains active and exercises routinely and denies any exertional dyspnea.  He is now on diltiazem 180 mg, and metoprolol succinate 150 mg daily.  He continues to  be on spironolactone 12.5 mg daily.  He is on Eliquis and denies bleeding.  His lipids are treated with Zetia and Livalo which is the only statin he can tolerate.  His wife underwent L5-S1 surgery by Dr. Vertell Limber last week.  He believes his stress level has improved as a result of her tolerating the surgery well.  He presents for reevaluation.   Past Medical History:  Diagnosis Date   Arthritis    hips    Atrial fibrillation ([place])    Cataract    Dysrhythmia    PAF- hx of 7 years ago    HLD (hyperlipidemia)    Hypertension 05/29/10   ECHO-EF 67% wnl   Meniere disease    Obesity    Palpitations    Sleep apnea 09/14/04   [facility]- Dr. Humphrey Rolls; CPAP titration 11/12/04- Dr. Humphrey Rolls    Past Surgical History:  Procedure Laterality Date   CARDIAC CATHETERIZATION  11/03/05   CATARACT EXTRACTION  2014   KNEE ARTHROSCOPY     left knee- 2002    TOTAL HIP ARTHROPLASTY  06/08/2011   Procedure: TOTAL HIP ARTHROPLASTY ANTERIOR APPROACH;  Surgeon: [name], MD;  Location: [facility];  Service: Orthopedics;  Laterality: Left;    Allergies  Allergen Reactions   Statins     Current Outpatient Medications  Medication Sig Dispense Refill   apixaban (ELIQUIS) 5 MG TABS tablet Take 1 tablet (5 mg total) by mouth 2 (two) times daily. 180 tablet 1   aspirin EC 81 MG tablet Take 1 tablet (81 mg total) by mouth daily. 90 tablet 3   Boswellia-Glucosamine-Vit D  (GLUCOSAMINE COMPLEX PO) Take 1,500 mg by mouth every morning.      co-enzyme Q-10 30 MG capsule Take 200 mg by mouth daily.      diltiazem (CARDIZEM CD) 180 MG 24 hr capsule Take 1 capsule (180 mg total) by mouth daily. 90 capsule 3   ezetimibe (ZETIA) 10 MG tablet TAKE 1 TABLET BY MOUTH EVERY DAY 90 tablet 3   LIVALO 2 MG TABS TAKE 1 TABLET BY MOUTH EVERY DAY 90 tablet 3   metFORMIN (GLUCOPHAGE) 500 MG tablet Take 1 tablet (500 mg total) by mouth daily with breakfast. 30 tablet 0   metoprolol succinate (TOPROL XL) 25 MG 24 hr tablet Take 25 mg in the morning (along with 100 mg in the evening) for 5 days- then if pulse still in 70's increase to 50 mg in the morning (along with 100 mg in the evening) 90 tablet 1   metoprolol succinate (TOPROL-XL) 100 MG 24 hr tablet Take 100 mg in the evenings (along with 25 mg or 50 mg in the mornings) 90 tablet 3   Multiple Vitamin (MULITIVITAMIN WITH MINERALS) TABS Take 1 tablet by mouth every evening.      olmesartan (BENICAR) 20 MG tablet Take 1 tablet (20 mg total) by mouth daily. 90 tablet 1   Omega 3-6-9 Fatty Acids (OMEGA-3-6-9 PO) Take 1 capsule by mouth every morning. Omega 3= 830m,omega 6=3838momega 9 =30043m   spironolactone (ALDACTONE) 25 MG tablet TAKE 1/2 TABLET DAILY 45 tablet 3   triamcinolone cream (KENALOG) 0.5 % APPLY 1 APPLICATION TOPICALLY 2 (TWO) TIMES DAILY. TO AFFECTED AREAS. 30 g 3   vitamin C (ASCORBIC ACID) 500 MG tablet Take 500 mg by mouth every morning.      zinc gluconate 50 MG tablet Take 50 mg by mouth every evening.      Zn-Pyg  Afri-Nettle-Saw Palmet (SAW PALMETTO COMPLEX PO) Take 1 capsule by mouth every morning.      No current facility-administered medications for this visit.     Socially he is remarried after his first wife passed away. He has 2 children. 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. there is no tobacco history.  He plays golf at least 2 days/week and this past year not  only did he have a hole in 1 but he has also shot his age.  ROS General: Negative; No fevers, chills, or night sweats;  HEENT: Negative; No changes in vision or hearing, sinus congestion, difficulty swallowing Pulmonary: Negative; No cough, wheezing, shortness of breath, hemoptysis Cardiovascular: See HPI GI: Negative; No nausea, vomiting, diarrhea, or abdominal pain GU: Negative; No dysuria, hematuria, or difficulty voiding Musculoskeletal: He is status post rib fracture.  He is status post hip replacement. Hematologic/Oncology: Negative; no easy bruising, bleeding Endocrine: Negative; no heat/cold intolerance; no diabetes Neuro: No vertigo or imbalance issues Skin: Negative; No rashes or skin lesions Psychiatric: Negative; No behavioral problems, depression Sleep: Positive for obstructive sleep apnea on CPAP therapy with 100% compliance No snoring, daytime sleepiness, hypersomnolence, bruxism, restless legs, hypnogognic hallucinations, no cataplexy Other comprehensive 14 point system review is negative.  PE BP (!) 159/99    Pulse 92    Ht 6' 1" (1.854 m)    Wt 231 lb (104.8 kg)    SpO2 98%    BMI 30.48 kg/m    Repeat blood pressure by me was 144/88  Wt Readings from Last 3 Encounters:  09/15/18 231 lb (104.8 kg)  09/04/18 230 lb (104.3 kg)  08/23/18 235 lb 3.2 oz (106.7 kg)   General: Alert, oriented, no distress.  Skin: normal turgor, no rashes, warm and dry HEENT: Normocephalic, atraumatic. Pupils equal round and reactive to light; sclera anicteric; extraocular muscles intact;  Nose without nasal septal hypertrophy Mouth/Parynx benign; Mallinpatti scale Neck: No JVD, no carotid bruits; normal carotid upstroke Lungs: clear to ausculatation and percussion; no wheezing or rales Chest wall: without tenderness to palpitation Heart: PMI not displaced, irregularly irregular with a ventricular rate in the 80s to 90, s1 s2 normal, 1/6 systolic murmur, no diastolic murmur, no rubs,  gallops, thrills, or heaves Abdomen: soft, nontender; no hepatosplenomehaly, BS+; abdominal aorta nontender and not dilated by palpation. Back: no CVA tenderness Pulses 2+ Musculoskeletal: full range of motion, normal strength, no joint deformities Extremities: no clubbing cyanosis or edema, Homan's sign negative  Neurologic: grossly nonfocal; Cranial nerves grossly wnl Psychologic: Normal mood and affect    ECG (independently read by me): Atrial fibrillation at 92 bpm.  Right bundle branch block with repolarization changes.  August 23, 2018 ECG (independently read by me): Atrial fibrillation at 96 bpm.  LVH with repolarization changes.  Left axis deviation.  Left anterior hemiblock.  Inferior Q waves  August 09, 2018 ECG (independently read by me):Atrial fibrillatoion at 64; LVH with repolarization  October 219 ECG (independently read by me): Normal sinus rhythm at 81 bpm.  Nonspecific intraventricular conduction delay.  Left axis deviation  September 2018 ECG (independently read by me): Normal sinus rhythm at 63 bpm.  Left bundle branch block with repolarization changes.  Normal intervals.  No ectopy.  April 2018 ECG (independently read by me): Normal sinus rhythm at 68 bpm.  LVH with repolarization.  Normal intervals.  November 2017 ECG (independently read by me): Normal sinus rhythm at 78 bpm.  Nonspecific interventricular delay.  QRS complex  V1 V2.  August 2016 ECG (independently read by me): Normal sinus rhythm at 77 bpm.  LVH with repolarization changes.  Left axis deviation.  December 2015 ECG (independently read by me): Normal sinus rhythm at 87 bpm.  LVH by voltage criteria with T-wave changes in leads 1 and aVL.  July 2014 ECG: Normal sinus rhythm at 76 beats per minute. LVH with QRS widening.  LABS:  BMP Latest Ref Rng & Units 09/04/2018 08/07/2018 10/17/2017  Glucose 65 - 99 mg/dL 115(H) 110(H) 108(H)  BUN 8 - 27 mg/dL 28(H) 26 25  Creatinine 0.76 - 1.27 mg/dL 1.43(H)  1.44(H) 1.40(H)  BUN/Creat Ratio 10 - _0 Sodium 134 - 144 mmol/L 139 141 140  Potassium 3.5 - 5.2 mmol/L 4.6 4.8 4.5  Chloride 96 - 106 mmol/L 106 104 104  CO2 20 - 29 mmol/L _1 Calcium 8.6 - 10.2 mg/dL 9.9 9.3 9.5   Hepatic Function Latest Ref Rng & Units 09/04/2018 08/07/2018 10/17/2017  Total Protein 6.0 - 8.5 g/dL 6.7 6.5 6.8  Albumin 3.7 - 4.7 g/dL 4.5 4.4 4.6  AST 0 - 40 IU/L 25 26 36  ALT 0 - 44 IU/L 26 25 45(H)  Alk Phosphatase 39 - 117 IU/L 50 49 59  Total Bilirubin 0.0 - 1.2 mg/dL 1.0 1.3(H) 1.1   CBC Latest Ref Rng & Units 08/09/2018 12/08/2015 08/27/2014  WBC 3.4 - 10.8 x10E3/uL 6.6 5.4 5.0  Hemoglobin 13.0 - 17.7 g/dL 15.7 15.2 15.3  Hematocrit 37.5 - 51.0 % 47.5 44.1 44.1  Platelets 150 - 450 x10E3/uL 208 194 199   Lab Results  Component Value Date   MCV 92 08/09/2018   MCV 90.6 12/08/2015   MCV 88.4 08/27/2014   Lab Results  Component Value Date   TSH 1.290 08/09/2018    Lab Results  Component Value Date   HGBA1C 5.8 (H) 09/04/2018    Lipid Panel     Component Value Date/Time   CHOL 117 08/07/2018 0901   TRIG 67 08/07/2018 0901   HDL 50 08/07/2018 0901   CHOLHDL 2.3 08/07/2018 0901   CHOLHDL 3.8 10/12/2016 0902   VLDL 17 04/14/2016 0937   LDLCALC 54 08/07/2018 0901   LDLCALC 119 (H) 10/12/2016 0902     RADIOLOGY: No results found.  IMPRESSION:  1. Persistent atrial fibrillation   2. Essential hypertension   3. OSA on CPAP   4. Anticoagulated   5. Mild obesity   6. Mixed hyperlipidemia      ASSESSMENT AND PLAN: Mr Fennell is a 73 year old gentleman who has a long-standing history of hypertension and had been on amlodipine/benazepril 10/40, HCTZ 25 mg, Toprol-XL 100 mg in addition to spironolactone 12.5 mg daily.  On this regimen his blood pressure had stabilized and consistently was well controlled.   He had gained significant amount of weight since August 2019 he initiated a major  lifestyle change and has been working closely  with Clayton [name] from healthy weight and wellness group.  He has lost approximately 36 pounds since his last evaluation.  For the previous 4 months, he was under significantly increased stress with the illness of his wife.  When I saw him in late July 2020 his ECG confirmed that he was in atrial fibrillation and I suspect that this may have been ongoing for at least 3 to 4 months prior to that evaluation without his awareness.    He had previously been in atrial fibrillation  in 2007 and had 13 years without recurrence.  He has continued to use CPAP with excellent results and benefit and essentially cannot sleep without it.  His AHI has been excellent.  Over the past several weeks he has been on an adjusted medical regimen consisting of Cardizem CD 180 mg, olmesartan 20 mg, spironolactone 12.5 mg in addition to metoprolol succinate 100 mg daily.  His echo Doppler study had shown normal ejection fraction at 55 to 60% and left atrial dimension was 4.5 cm.  Despite increased rate control medication, he continues to be in atrial fibrillation today.  We again discussed cardioversion which he prefers on rate control medicine at this point rather than initiating an attempt at antiarrhythmic therapy.  I suspect the duration of his atrial fibrillation is making it difficult for his restoration of sinus rhythm with just rate control medicines.  Next week, we will plan for him to have a COVID test and depending upon scheduling we will then schedule him to undergo outpatient DC cardioversion.  I will be on vacation during the timeframe with this can be done and will schedule this to be done with 1 of my partners who performed the procedure.  He is aware of the risk benefits of the procedure.   [name], MD, [facility]  09/17/2018 2:57 PM

## 2018-09-15 NOTE — Progress Notes (Signed)
Patient ID: Clayton [name], male   DOB: [birth year], 73 y.o.   MRN: [id number]     HPI: Clayton [name] is a 73 y.o. male who presents for a 4 week follow-up cardiology and sleep evaluation.  Clayton [name] has a history of hypertension, a remote history of PAF, status post cardioversion in 2007, obstructive sleep apnea for which he uses CPAP 100% of the time, hypertension, and hyperlipidemia. He has had weight fluctuations over the years.  In the past, he also had a history of Clayton [name].  There was some concern by Clayton [name] that perhaps this may have been related to statin use, which led to its discontinuance.  He is not had Clayton [name] in some time.  He underwent left hip replacement surgery by Clayton [name] and now has been able to continue to be active and exercises routinely.   His  blood pressure regimen has been amlodipine/benazepril 10/40 daily, hydrochlorothiazide has been taking 25 Mill grams in the evening and Toprol-XL 100 mg daily. He had atrial of livalo  For hyperlipidemia but developed some diarrhea secondary to this.    He has not had much success with weight loss over the past several years.    He continues to use his CPAP therapy with 100% compliance.  He will not even take a nap without his CPAP therapy and he takes his CPAP unit on alll his travels.   Since initiating CPAP therapy, he is unaware of any palpitations.  He denies any recurrent atrial fibrillation.  In the past he has had difficulty with family stress and his father had passed away, and his wife's mother recently died after having developed lung CA and had significant PVD.  He was not exercising as much as he had in the past.  He has not been successful in weight loss.  He continues to be active and still walks 3-4 miles per day.  Oftentimes in the morning when he awakens his blood pressure is elevated at 150/90 but after exercise it drops to 128/80.  He is unaware of any recurrent atrial fibrillation.  He  continues to use CPAP with 100% compliance.   He completed a two-year grieving process.  On 01/19/2016 he committed to weight loss.  On January 1, he weighed 278 pounds .  He has been walking 4 miles 5 days per week as well as some intermittent stationary bike.  He has lost a total of 32 pounds since January.  He continues to use CPAP with 100% compliance and since he has been on CPAP, he has not had any recurrent atrial fibrillation and his blood pressure has been less labile.  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  He has had failures to Crestor and Lipitor in the past due to significant myalgias, development, even with weekly dosing.  I resumed zetia   In August 2018 he noticed his heart rate increasing and he presented for evaluation and was seen by Clayton [name], PAC.  He admitted to a rare palpitation.  He denied any chest pain with exertion or dyspnea on exertion.  He has suffered a broken rib.  This year.  He has a weight goal of 220 pounds.    I  saw him in September 2018, at which time he was doing well from a cardiovascular standpoint.   His father was ill and ultimately passed away in [place].  As result he was going back and forth to the [facility] area.  This resulted in a change in his diet and ability to exercise.  I last saw him in October 2019 and his weight had increased from 240 back up to 270 pounds.  He was unaware of any episodes of atrial fibrillation.  He was using CPAP with 100% compliance along with his naps.  He was committed to begin weight loss again.   ° °Since I saw him on October 19, 2018 he has had major lifestyle change.  He has been working with Clayton [name] in the healthy weight and wellness group.  Peak weight was 280 and most recent weight 244.  I saw him for a cardiology evaluation on August 09, 2018.  O ver the past 4 months, he had been under increased stress as result of his wife's illness.  He has been checking his blood pressure regularly and this has  been stable but on his blood pressure recording he has noticed the message of heart rate irregularity.  He is unaware of A. fib but has noticed this message for the last 3 to 4 months.  He is exercising every day and typically walks 2 miles in the morning and in the afternoon or evening does 30 minutes of stationary bike at least 5 to 6 days/week.  He continues to use CPAP.  I obtained a download in the office today from June 22 through August 08, 2018.  He is 100% compliant.  He has a ResMed air sense 10 auto unit with a pressure range of 8-20 with 95% pressure at 10.1 and maximum average and 11.5.  AHI is excellent at 1.2.    ° °During his August 09, 2018 encounter his ECG demonstrated that he was back in atrial fibrillation and had a ventricular rate at 89 bpm.  There was LVH with repolarization changes.  At that time, I recommended discontinuance of amlodipine/benazepril combination and in its place started Cardizem CD 180 mg rather than amlodipine and olmesartan 20 mg rather than benazepril.  I initiated anticoagulation with Eliquis 5 mg twice a day. ° °He underwent a 2D echo Doppler study on August 16, 2018 which showed EF low normal at 50 to 55%.  There was evidence for significant LVH and moderate dilation of his left atrium. ° °I saw him for follow-up evaluation on August 23, 2018.  At that time he was now cognizant of his heart rate irregularity.  He  He has continued to use CPAP with excellent compliance.  A new download was obtained from July 5 through August 21, 2018 which shows 100% compliance with average usage 7 hours and 28 minutes.  AHI is 1.6 and his 95th percentile auto pressure is 10.1 with a maximum average pressure of 11.4.  He states he has lost weight over the past several weeks purposefully.  He continues to exercise now on a stationary bike.  He denies any chest pain PND orthopnea.  He was tolerating Eliquis without bleeding.  During that evaluation, we discussed different options including  several additional weeks of increased weight control versus initiating antiarrhythmic therapy.  Since his only other episode of atrial fibrillation previously occurred 13 years ago and he had not had any recurrence until this year he opted for an increased rate control trial.  As result metoprolol dose was further titrated.  Over the past several weeks he remains active and exercises routinely and denies any exertional dyspnea.  He is now on diltiazem 180 mg, and metoprolol succinate 150 mg daily.  He continues to   be on spironolactone 12.5 mg daily.  He is on Eliquis and denies bleeding.  His lipids are treated with Zetia and Livalo which is the only statin he can tolerate.  His wife underwent L5-S1 surgery by Dr. Stern last week.  He believes his stress level has improved as a result of her tolerating the surgery well.  He presents for reevaluation. ° ° °Past Medical History:  °Diagnosis Date  °• Arthritis   ° hips   °• Atrial fibrillation (HCC)   °• Cataract   °• Dysrhythmia   ° PAF- hx of 7 years ago   °• HLD (hyperlipidemia)   °• Hypertension 05/29/10  ° ECHO-EF 67% wnl  °• Meniere [name]   °• Obesity   °• Palpitations   °• Sleep apnea 09/14/04  ° [facility]- Dr. khan; CPAP titration 11/12/04- Dr. khan  ° ° °Past Surgical History:  °Procedure Laterality Date  °• CARDIAC CATHETERIZATION  11/03/05  °• CATARACT EXTRACTION  2014  °• KNEE ARTHROSCOPY    ° left knee- 2002   °• TOTAL HIP ARTHROPLASTY  06/08/2011  ° Procedure: TOTAL HIP ARTHROPLASTY ANTERIOR APPROACH;  Surgeon: Matthew D Olin, MD;  Location: [facility];  Service: Orthopedics;  Laterality: Left;  ° ° °Allergies  °Allergen Reactions  °• Statins   ° ° °Current Outpatient Medications  °Medication Sig Dispense Refill  °• apixaban (ELIQUIS) 5 MG TABS tablet Take 1 tablet (5 mg total) by mouth 2 (two) times daily. 180 tablet 1  °• aspirin EC 81 MG tablet Take 1 tablet (81 mg total) by mouth daily. 90 tablet 3  °• Boswellia-Glucosamine-Vit D  (GLUCOSAMINE COMPLEX PO) Take 1,500 mg by mouth every morning.     °• co-enzyme Q-10 30 MG capsule Take 200 mg by mouth daily.     °• diltiazem (CARDIZEM CD) 180 MG 24 hr capsule Take 1 capsule (180 mg total) by mouth daily. 90 capsule 3  °• ezetimibe (ZETIA) 10 MG tablet TAKE 1 TABLET BY MOUTH EVERY DAY 90 tablet 3  °• LIVALO 2 MG TABS TAKE 1 TABLET BY MOUTH EVERY DAY 90 tablet 3  °• metFORMIN (GLUCOPHAGE) 500 MG tablet Take 1 tablet (500 mg total) by mouth daily with breakfast. 30 tablet 0  °• metoprolol succinate (TOPROL XL) 25 MG 24 hr tablet Take 25 mg in the morning (along with 100 mg in the evening) for 5 days- then if pulse still in 70's increase to 50 mg in the morning (along with 100 mg in the evening) 90 tablet 1  °• metoprolol succinate (TOPROL-XL) 100 MG 24 hr tablet Take 100 mg in the evenings (along with 25 mg or 50 mg in the mornings) 90 tablet 3  °• Multiple Vitamin (MULITIVITAMIN WITH MINERALS) TABS Take 1 tablet by mouth every evening.     °• olmesartan (BENICAR) 20 MG tablet Take 1 tablet (20 mg total) by mouth daily. 90 tablet 1  °• Omega 3-6-9 Fatty Acids (OMEGA-3-6-9 PO) Take 1 capsule by mouth every morning. Omega 3= 800mg,omega 6=380mg,omega 9 =300mg    °• spironolactone (ALDACTONE) 25 MG tablet TAKE 1/2 TABLET DAILY 45 tablet 3  °• triamcinolone cream (KENALOG) 0.5 % APPLY 1 APPLICATION TOPICALLY 2 (TWO) TIMES DAILY. TO AFFECTED AREAS. 30 g 3  °• vitamin C (ASCORBIC ACID) 500 MG tablet Take 500 mg by mouth every morning.     °• zinc gluconate 50 MG tablet Take 50 mg by mouth every evening.     °• Zn-Pyg   Afri-Nettle-Saw Palmet (SAW PALMETTO COMPLEX PO) Take 1 capsule by mouth every morning.     ° °No current facility-administered medications for this visit.   ° ° °Socially he is remarried after his first wife passed away. He has 2 children. 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 there is no tobacco history.  He plays golf at least 2 days/week and this past year not  only did he have a hole in 1 but he has also shot his age. ° °ROS °General: Negative; No fevers, chills, or night sweats;  °HEENT: Negative; No changes in vision or hearing, sinus congestion, difficulty swallowing °Pulmonary: Negative; No cough, wheezing, shortness of breath, hemoptysis °Cardiovascular: See HPI °GI: Negative; No nausea, vomiting, diarrhea, or abdominal pain °GU: Negative; No dysuria, hematuria, or difficulty voiding °Musculoskeletal: He is status post rib fracture.  He is status post hip replacement. °Hematologic/Oncology: Negative; no easy bruising, bleeding °Endocrine: Negative; no heat/cold intolerance; no diabetes °Neuro: No vertigo or imbalance issues °Skin: Negative; No rashes or skin lesions °Psychiatric: Negative; No behavioral problems, depression °Sleep: Positive for obstructive sleep apnea on CPAP therapy with 100% compliance No snoring, daytime sleepiness, hypersomnolence, bruxism, restless legs, hypnogognic hallucinations, no cataplexy °Other comprehensive 14 point system review is negative. ° °PE °BP (!) 159/99    Pulse 92    Ht 6' 1" (1.854 m)    Wt 231 lb (104.8 kg)    SpO2 98%    BMI 30.48 kg/m²   ° °Repeat blood pressure by me was 144/88 ° °Wt Readings from Last 3 Encounters:  °09/15/18 231 lb (104.8 kg)  °09/04/18 230 lb (104.3 kg)  °08/23/18 235 lb 3.2 oz (106.7 kg)  ° °General: Alert, oriented, no distress.  °Skin: normal turgor, no rashes, warm and dry °HEENT: Normocephalic, atraumatic. Pupils equal round and reactive to light; sclera anicteric; extraocular muscles intact;  °Nose without nasal septal hypertrophy °Mouth/Parynx benign; Mallinpatti scale °Neck: No JVD, no carotid bruits; normal carotid upstroke °Lungs: clear to ausculatation and percussion; no wheezing or rales °Chest wall: without tenderness to palpitation °Heart: PMI not displaced, irregularly irregular with a ventricular rate in the 80s to 90, s1 s2 normal, 1/6 systolic murmur, no diastolic murmur, no rubs,  gallops, thrills, or heaves °Abdomen: soft, nontender; no hepatosplenomehaly, BS+; abdominal aorta nontender and not dilated by palpation. °Back: no CVA tenderness °Pulses 2+ °Musculoskeletal: full range of motion, normal strength, no joint deformities °Extremities: no clubbing cyanosis or edema, Homan's sign negative  °Neurologic: grossly nonfocal; Cranial nerves grossly wnl °Psychologic: Normal mood and affect ° ° ° °ECG (independently read by me): Atrial fibrillation at 92 bpm.  Right bundle branch block with repolarization changes. ° °August 23, 2018 ECG (independently read by me): Atrial fibrillation at 96 bpm.  LVH with repolarization changes.  Left axis deviation.  Left anterior hemiblock.  Inferior Q waves ° °August 09, 2018 ECG (independently read by me):Atrial fibrillatoion at 89; LVH with repolarization ° °October 219 ECG (independently read by me): Normal sinus rhythm at 81 bpm.  Nonspecific intraventricular conduction delay.  Left axis deviation ° °September 2018 ECG (independently read by me): Normal sinus rhythm at 63 bpm.  Left bundle branch block with repolarization changes.  Normal intervals.  No ectopy. ° °April 2018 ECG (independently read by me): Normal sinus rhythm at 68 bpm.  LVH with repolarization.  Normal intervals. ° °November 2017 ECG (independently read by me): Normal sinus rhythm at 78 bpm.  Nonspecific interventricular delay.  QRS complex   V1 V2. ° °August 2016 ECG (independently read by me): Normal sinus rhythm at 77 bpm.  LVH with repolarization changes.  Left axis deviation. ° °December 2015 ECG (independently read by me): Normal sinus rhythm at 87 bpm.  LVH by voltage criteria with T-wave changes in leads 1 and aVL. ° °July 2014 ECG: Normal sinus rhythm at 76 beats per minute. LVH with QRS widening. ° °LABS: ° °BMP Latest Ref Rng & Units 09/04/2018 08/07/2018 10/17/2017  °Glucose 65 - 99 mg/dL 115(H) 110(H) 108(H)  °BUN 8 - 27 mg/dL 28(H) 26 25  °Creatinine 0.76 - 1.27 mg/dL 1.43(H)  1.44(H) 1.40(H)  °BUN/Creat Ratio 10 - 24 20 18 18  °Sodium 134 - 144 mmol/L 139 141 140  °Potassium 3.5 - 5.2 mmol/L 4.6 4.8 4.5  °Chloride 96 - 106 mmol/L 106 104 104  °CO2 20 - 29 mmol/L 20 21 20  °Calcium 8.6 - 10.2 mg/dL 9.9 9.3 9.5  ° °Hepatic Function Latest Ref Rng & Units 09/04/2018 08/07/2018 10/17/2017  °Total Protein 6.0 - 8.5 g/dL 6.7 6.5 6.8  °Albumin 3.7 - 4.7 g/dL 4.5 4.4 4.6  °AST 0 - 40 IU/L 25 26 36  °ALT 0 - 44 IU/L 26 25 45(H)  °Alk Phosphatase 39 - 117 IU/L 50 49 59  °Total Bilirubin 0.0 - 1.2 mg/dL 1.0 1.3(H) 1.1  ° °CBC Latest Ref Rng & Units 08/09/2018 12/08/2015 08/27/2014  °WBC 3.4 - 10.8 x10E3/uL 6.6 5.4 5.0  °Hemoglobin 13.0 - 17.7 g/dL 15.7 15.2 15.3  °Hematocrit 37.5 - 51.0 % 47.5 44.1 44.1  °Platelets 150 - 450 x10E3/uL 208 194 199  ° °Lab Results  °Component Value Date  ° MCV 92 08/09/2018  ° MCV 90.6 12/08/2015  ° MCV 88.4 08/27/2014  ° °Lab Results  °Component Value Date  ° TSH 1.290 08/09/2018  °  °Lab Results  °Component Value Date  ° HGBA1C 5.8 (H) 09/04/2018  °  °Lipid Panel  °   °Component Value Date/Time  ° CHOL 117 08/07/2018 0901  ° TRIG 67 08/07/2018 0901  ° HDL 50 08/07/2018 0901  ° CHOLHDL 2.3 08/07/2018 0901  ° CHOLHDL 3.8 10/12/2016 0902  ° VLDL 17 04/14/2016 0937  ° LDLCALC 54 08/07/2018 0901  ° LDLCALC 119 (H) 10/12/2016 0902  ° ° ° °RADIOLOGY: °No results found. ° °IMPRESSION: ° °1. Persistent atrial fibrillation   °2. Essential hypertension   °3. OSA on CPAP   °4. Anticoagulated   °5. Mild obesity   °6. Mixed hyperlipidemia   °  ° °ASSESSMENT AND PLAN: °Mr Coco is a 73 year old gentleman who has a long-standing history of hypertension and had been on amlodipine/benazepril 10/40, HCTZ 25 mg, Toprol-XL 100 mg in addition to spironolactone 12.5 mg daily.  On this regimen his blood pressure had stabilized and consistently was well controlled.   He had gained significant amount of weight since August 2019 he initiated a major  lifestyle change and has been working closely  with Clayton [name] from healthy weight and wellness group.  He has lost approximately 36 pounds since his last evaluation.  For the previous 4 months, he was under significantly increased stress with the illness of his wife.  When I saw him in late July 2020 his ECG confirmed that he was in atrial fibrillation and I suspect that this may have been ongoing for at least 3 to 4 months prior to that evaluation without his awareness.    He had previously been in atrial fibrillation   in 2007 and had 13 years without recurrence.  He has continued to use CPAP with excellent results and benefit and essentially cannot sleep without it.  His AHI has been excellent.  Over the past several weeks he has been on an adjusted medical regimen consisting of Cardizem CD 180 mg, olmesartan 20 mg, spironolactone 12.5 mg in addition to metoprolol succinate 100 mg daily.  His echo Doppler study had shown normal ejection fraction at 55 to 60% and left atrial dimension was 4.5 cm.  Despite increased rate control medication, he continues to be in atrial fibrillation today.  We again discussed cardioversion which he prefers on rate control medicine at this point rather than initiating an attempt at antiarrhythmic therapy.  I suspect the duration of his atrial fibrillation is making it difficult for his restoration of sinus rhythm with just rate control medicines.  Next week, we will plan for him to have a COVID test and depending upon scheduling we will then schedule him to undergo outpatient DC cardioversion.  I will be on vacation during the timeframe with this can be done and will schedule this to be done with 1 of my partners who performed the procedure.  He is aware of the risk benefits of the procedure.   [name], MD, [facility]  09/17/2018 2:57 PM

## 2018-09-17 ENCOUNTER — Encounter: Payer: Self-pay | Admitting: Cardiovascular Disease

## 2018-09-18 NOTE — Addendum Note (Signed)
Addended by: Waylan Rocher on: 09/18/2018 08:56 AM   Modules accepted: Orders

## 2018-09-18 NOTE — Patient Instructions (Addendum)
Dear Clayton [name]  You are scheduled for a Cardioversion on Tuesday, 09-26-2018 with Dr. Audie Box .  Please arrive at the [facility] (Main Entrance A) at [facility]: [place], [place] [address] at 12 PM. (1 hour prior to procedure unless lab work is needed; if lab work is needed arrive 1.5 hours ahead)  DIET:  Nothing to eat or drink after midnight the night before your cardioversion, except a sips of water with medications (see medication instructions below)  Medication Instructions: Hold metformin and any other diabetic medications.  Continue your anticoagulant: Eliquis.   You will need to continue your anticoagulant after your procedure until you are told by your Provider that it is safe to stop  Labs: Come to: [address] between the hours of 8:00 am and 4:30 pm. You do not have to be fasting.   COVID testing: Friday 09-22-2018 after labs are drawn.  You must have a responsible person to drive you home after your procedure and stay in the waiting area during your procedure. Failure to do so could result in cancellation.  Bring your insurance cards.  *Special Note: Every effort is made to have your procedure done on time. Occasionally there are emergencies that occur at the hospital that may cause delays. Please be patient if a delay does occur.

## 2018-09-20 ENCOUNTER — Encounter (INDEPENDENT_AMBULATORY_CARE_PROVIDER_SITE_OTHER): Payer: Self-pay | Admitting: Family Medicine

## 2018-09-20 NOTE — Telephone Encounter (Signed)
Please advise 

## 2018-09-22 ENCOUNTER — Other Ambulatory Visit (HOSPITAL_COMMUNITY)
Admission: RE | Admit: 2018-09-22 | Discharge: 2018-09-22 | Disposition: A | Discharge status: Home / Self Care | Payer: Medicare Other | Source: Ambulatory Visit | Attending: Cardiovascular Disease | Admitting: Cardiovascular Disease

## 2018-09-22 ENCOUNTER — Other Ambulatory Visit: Payer: Self-pay

## 2018-09-22 DIAGNOSIS — Z20828 Contact with and (suspected) exposure to other viral communicable diseases: Secondary | ICD-10-CM | POA: Insufficient documentation

## 2018-09-22 DIAGNOSIS — Z01812 Encounter for preprocedural laboratory examination: Secondary | ICD-10-CM | POA: Diagnosis present

## 2018-09-22 LAB — COMPREHENSIVE METABOLIC PANEL
ALT: 27 IU/L (ref 0–44)
AST: 27 IU/L (ref 0–40)
Albumin/Globulin Ratio: 2.5 — ABNORMAL HIGH (ref 1.2–2.2)
Albumin: 4.8 g/dL — ABNORMAL HIGH (ref 3.7–4.7)
Alkaline Phosphatase: 52 IU/L (ref 39–117)
BUN/Creatinine Ratio: 18 (ref 10–24)
BUN: 30 mg/dL — ABNORMAL HIGH (ref 8–27)
Bilirubin Total: 1.4 mg/dL — ABNORMAL HIGH (ref 0.0–1.2)
CO2: 22 mmol/L (ref 20–29)
Calcium: 10.3 mg/dL — ABNORMAL HIGH (ref 8.6–10.2)
Chloride: 102 mmol/L (ref 96–106)
Creatinine, Ser: 1.69 mg/dL — ABNORMAL HIGH (ref 0.76–1.27)
GFR calc Af Amer: 46 mL/min/{1.73_m2} — ABNORMAL LOW (ref >59)
GFR calc non Af Amer: 40 mL/min/{1.73_m2} — ABNORMAL LOW (ref >59)
Globulin, Total: 1.9 g/dL (ref 1.5–4.5)
Glucose: 98 mg/dL (ref 65–99)
Potassium: 4.5 mmol/L (ref 3.5–5.2)
Sodium: 141 mmol/L (ref 134–144)
Total Protein: 6.7 g/dL (ref 6.0–8.5)

## 2018-09-22 LAB — CBC
Hematocrit: 46.2 % (ref 37.5–51.0)
Hemoglobin: 15.4 g/dL (ref 13.0–17.7)
MCH: 31.6 pg (ref 26.6–33.0)
MCHC: 33.3 g/dL (ref 31.5–35.7)
MCV: 95 fL (ref 79–97)
Platelets: 187 10*3/uL (ref 150–450)
RBC: 4.88 x10E6/uL (ref 4.14–5.80)
RDW: 13.2 % (ref 11.6–15.4)
WBC: 6.1 10*3/uL (ref 3.4–10.8)

## 2018-09-23 LAB — NOVEL CORONAVIRUS, NAA (HOSP ORDER, SEND-OUT TO REF LAB; TAT 18-24 HRS): SARS-CoV-2, NAA: NOT DETECTED

## 2018-09-26 ENCOUNTER — Encounter (HOSPITAL_COMMUNITY): Payer: Self-pay | Admitting: Emergency Medicine

## 2018-09-26 ENCOUNTER — Encounter (HOSPITAL_COMMUNITY)
Admission: RE | Disposition: A | Discharge status: Home / Self Care | Payer: Self-pay | Source: Home / Self Care | Attending: Cardiovascular Disease

## 2018-09-26 ENCOUNTER — Other Ambulatory Visit: Payer: Self-pay

## 2018-09-26 ENCOUNTER — Ambulatory Visit (HOSPITAL_COMMUNITY): Payer: Medicare Other | Admitting: Certified Registered Nurse Anesthetist

## 2018-09-26 ENCOUNTER — Ambulatory Visit (HOSPITAL_COMMUNITY)
Admission: RE | Admit: 2018-09-26 | Discharge: 2018-09-26 | Disposition: A | Discharge status: Home / Self Care | Payer: Medicare Other | Attending: Cardiovascular Disease | Admitting: Cardiovascular Disease

## 2018-09-26 DIAGNOSIS — Z79899 Other long term (current) drug therapy: Secondary | ICD-10-CM | POA: Diagnosis not present

## 2018-09-26 DIAGNOSIS — E782 Mixed hyperlipidemia: Secondary | ICD-10-CM | POA: Insufficient documentation

## 2018-09-26 DIAGNOSIS — Z7901 Long term (current) use of anticoagulants: Secondary | ICD-10-CM | POA: Diagnosis not present

## 2018-09-26 DIAGNOSIS — I4891 Unspecified atrial fibrillation: Secondary | ICD-10-CM

## 2018-09-26 DIAGNOSIS — Z7984 Long term (current) use of oral hypoglycemic drugs: Secondary | ICD-10-CM | POA: Diagnosis not present

## 2018-09-26 DIAGNOSIS — Z7982 Long term (current) use of aspirin: Secondary | ICD-10-CM | POA: Diagnosis not present

## 2018-09-26 DIAGNOSIS — Z888 Allergy status to other drugs, medicaments and biological substances status: Secondary | ICD-10-CM | POA: Diagnosis not present

## 2018-09-26 DIAGNOSIS — E669 Obesity, unspecified: Secondary | ICD-10-CM | POA: Insufficient documentation

## 2018-09-26 DIAGNOSIS — Z6830 Body mass index (BMI) 30.0-30.9, adult: Secondary | ICD-10-CM | POA: Insufficient documentation

## 2018-09-26 DIAGNOSIS — I10 Essential (primary) hypertension: Secondary | ICD-10-CM | POA: Insufficient documentation

## 2018-09-26 DIAGNOSIS — M1990 Unspecified osteoarthritis, unspecified site: Secondary | ICD-10-CM | POA: Insufficient documentation

## 2018-09-26 DIAGNOSIS — G4733 Obstructive sleep apnea (adult) (pediatric): Secondary | ICD-10-CM | POA: Diagnosis not present

## 2018-09-26 DIAGNOSIS — E785 Hyperlipidemia, unspecified: Secondary | ICD-10-CM | POA: Diagnosis not present

## 2018-09-26 DIAGNOSIS — I4819 Other persistent atrial fibrillation: Secondary | ICD-10-CM | POA: Diagnosis present

## 2018-09-26 HISTORY — PX: CARDIOVERSION: SHX1299

## 2018-09-26 SURGERY — Surgical Case
Anesthesia: *Unknown

## 2018-09-26 SURGERY — CARDIOVERSION
Anesthesia: General

## 2018-09-26 MED ORDER — SODIUM CHLORIDE 0.9 % IV SOLN
INTRAVENOUS | Status: AC | PRN
  Administered 2018-09-26: 500 mL via INTRAMUSCULAR

## 2018-09-26 MED ORDER — LIDOCAINE HCL (CARDIAC) PF 100 MG/5ML IV SOSY
PREFILLED_SYRINGE | INTRAVENOUS | Status: DC | PRN
  Administered 2018-09-26: 60 mg via INTRAVENOUS

## 2018-09-26 MED ORDER — PROPOFOL 10 MG/ML IV BOLUS
INTRAVENOUS | Status: DC | PRN
  Administered 2018-09-26: 80 mg via INTRAVENOUS

## 2018-09-26 MED ORDER — SODIUM CHLORIDE 0.9 % IV SOLN
INTRAVENOUS | Status: DC | PRN
  Administered 2018-09-26: 14:00:00 via INTRAVENOUS

## 2018-09-26 NOTE — Interval H&P Note (Signed)
History and Physical Interval Note:  09/26/2018 12:56 PM  Clayton [name]  has presented today for surgery, with the diagnosis of a-fib.  The various methods of treatment have been discussed with the patient and family. After consideration of risks, benefits and other options for treatment, the patient has consented to  Procedure(s): CARDIOVERSION (N/A) as a surgical intervention.  The patient's history has been reviewed, patient examined, no change in status, stable for surgery.  I have reviewed the patient's chart and labs.  Questions were answered to the patient's satisfaction.     Lake Bells T O'Neal

## 2018-09-26 NOTE — CV Procedure (Signed)
   DIRECT CURRENT CARDIOVERSION  NAME:  Clayton [name]    MRN: [id number] DOB:  31-Jul-1945    ADMIT DATE: 09/26/2018  Indication:  Symptomatic atrial fibrillation  Procedure Note:  The patient signed informed consent.  They have had had therapeutic anticoagulation with eliquis greater than 3 weeks.  Anesthesia was administered by Dr. Nyoka Cowden.  Adequate airway was maintained throughout and vital followed per protocol.  They were cardioverted x 1 with 200J of biphasic synchronized energy.  They converted to NSR.  There were no apparent complications.  The patient had normal neuro status and respiratory status post procedure with vitals stable as recorded elsewhere.    Follow up: They will continue on current medical therapy and follow up with cardiology as scheduled.  Lake Bells T. Audie Box, [place]  [address], [place],  [address] [phone]  1:43 PM

## 2018-09-26 NOTE — Anesthesia Postprocedure Evaluation (Signed)
Anesthesia Post Note  Patient: Clayton [name]  Procedure(s) Performed: CARDIOVERSION (N/A )     Patient location during evaluation: Endoscopy Anesthesia Type: General Level of consciousness: awake Pain management: pain level controlled Vital Signs Assessment: post-procedure vital signs reviewed and stable Respiratory status: spontaneous breathing Cardiovascular status: stable Postop Assessment: no apparent nausea or vomiting Anesthetic complications: no    Last Vitals:  Vitals:   09/26/18 1400 09/26/18 1410  BP: (!) 144/96 (!) 141/99  Pulse: (!) 58 67  Resp: (!) 22 16  Temp:    SpO2: 96% 98%    Last Pain:  Vitals:   09/26/18 1410  TempSrc:   PainSc: 0-No pain                 Makynlie Rossini

## 2018-09-26 NOTE — Transfer of Care (Signed)
Immediate Anesthesia Transfer of Care Note  Patient: Clayton [name]  Procedure(s) Performed: CARDIOVERSION (N/A )  Patient Location: Endoscopy Unit  Anesthesia Type:General  Level of Consciousness: awake, alert , oriented and patient cooperative  Airway & Oxygen Therapy: Patient Spontanous Breathing and Patient connected to nasal cannula oxygen  Post-op Assessment: Report given to RN and Post -op Vital signs reviewed and stable  Post vital signs: Reviewed and stable  Last Vitals:  Vitals Value Taken Time  BP    Temp    Pulse    Resp    SpO2      Last Pain:  Vitals:   09/26/18 1251  TempSrc: Temporal  PainSc: 0-No pain         Complications: No apparent anesthesia complications

## 2018-09-26 NOTE — Discharge Instructions (Signed)

## 2018-09-26 NOTE — Anesthesia Preprocedure Evaluation (Signed)
Anesthesia Evaluation  Patient identified by MRN, date of birth, ID band Patient awake    Reviewed: Allergy & Precautions, NPO status , Patient's Chart, lab work & pertinent test results  Airway Mallampati: II  TM Distance: >3 FB     Dental   Pulmonary sleep apnea ,    breath sounds clear to auscultation       Cardiovascular hypertension, + dysrhythmias Atrial Fibrillation  Rhythm:Regular Rate:Normal     Neuro/Psych    GI/Hepatic negative GI ROS, Neg liver ROS,   Endo/Other  negative endocrine ROS  Renal/GU negative Renal ROS     Musculoskeletal  (+) Arthritis ,   Abdominal   Peds  Hematology negative hematology ROS (+)   Anesthesia Other Findings   Reproductive/Obstetrics                             Anesthesia Physical Anesthesia Plan  ASA: III  Anesthesia Plan: General   Post-op Pain Management:    Induction: Intravenous  PONV Risk Score and Plan: 2  Airway Management Planned: Simple Face Mask and Nasal Cannula  Additional Equipment:   Intra-op Plan:   Post-operative Plan:   Informed Consent: I have reviewed the patients History and Physical, chart, labs and discussed the procedure including the risks, benefits and alternatives for the proposed anesthesia with the patient or authorized representative who has indicated his/her understanding and acceptance.     Dental advisory given  Plan Discussed with: CRNA, Anesthesiologist and Surgeon  Anesthesia Plan Comments:         Anesthesia Quick Evaluation

## 2018-09-27 ENCOUNTER — Encounter (HOSPITAL_COMMUNITY): Payer: Self-pay | Admitting: Cardiovascular Disease

## 2018-09-27 ENCOUNTER — Telehealth: Payer: Self-pay | Admitting: Cardiovascular Disease

## 2018-09-27 NOTE — Telephone Encounter (Signed)
Patient needs appt with [name] PA on 18th  Thanks!

## 2018-09-27 NOTE — Telephone Encounter (Signed)
New Message  Patient states that he is returning a call to Dr Evette Georges nurse. States that it was in response to a my chart message. Please give patient a call back.

## 2018-09-28 NOTE — Telephone Encounter (Signed)
Left message for pt to schedule a visit with the PA Cherlynn Polo on 09/18.

## 2018-10-02 ENCOUNTER — Other Ambulatory Visit: Payer: Self-pay

## 2018-10-02 ENCOUNTER — Encounter (INDEPENDENT_AMBULATORY_CARE_PROVIDER_SITE_OTHER): Payer: Self-pay | Admitting: Family Medicine

## 2018-10-02 ENCOUNTER — Ambulatory Visit (INDEPENDENT_AMBULATORY_CARE_PROVIDER_SITE_OTHER): Payer: Medicare Other | Admitting: Family Medicine

## 2018-10-02 VITALS — BP 132/80 | HR 61 | Temp 98.0°F | Ht 73.0 in | Wt 223.0 lb

## 2018-10-02 DIAGNOSIS — E669 Obesity, unspecified: Secondary | ICD-10-CM

## 2018-10-02 DIAGNOSIS — Z6830 Body mass index (BMI) 30.0-30.9, adult: Secondary | ICD-10-CM

## 2018-10-02 DIAGNOSIS — R7303 Prediabetes: Secondary | ICD-10-CM

## 2018-10-02 MED ORDER — METFORMIN HCL 500 MG PO TABS: 500.0000 mg | ORAL_TABLET | Freq: Every day | ORAL | 0 refills | Status: DC

## 2018-10-03 NOTE — Progress Notes (Signed)
Office: [phone]  /  Fax: [phone]   HPI:   Chief Complaint: OBESITY Clayton [name] is here to discuss his progress with his obesity treatment plan. He is on the lower carbohydrate, vegetable, and lean protein rich diet plan and is following his eating plan approximately 70 % of the time. He states he is walking or riding a bike 45 minutes 7 times per week. Clayton [name] has done well with weight loss. He has done better on the lower carb plan and the journaling plan. He notes some increased stress with his wife's recent back surgery.  His weight is 223 lb (101.2 kg) today and has had a weight loss of 7 pounds over a period of 5 weeks since his last visit. He has lost 43 lbs since starting treatment with [name].  Pre-Diabetes Clayton [name] has a diagnosis of pre-diabetes based on his elevated Hgb A1c of 5.8 on 09/04/18 and was informed this puts him at greater risk of developing diabetes. He is taking metformin currently and continues to work on diet and exercise to decrease risk of diabetes. He denies nausea, vomiting, or hypoglycemia.   ASSESSMENT AND PLAN:  Prediabetes - Plan: metFORMIN (GLUCOPHAGE) 500 MG tablet  Class 1 obesity with serious comorbidity and body mass index (BMI) of 30.0 to 30.9 in adult, unspecified obesity type - BMI greater than 30 at start of program   PLAN:  Pre-Diabetes Clayton [name] will continue to work on weight loss, exercise, and decreasing simple carbohydrates in his diet to help decrease the risk of diabetes. He was informed that eating too many simple carbohydrates or too many calories at one sitting increases the likelihood of GI side effects. Clayton [name] agreed to continue metformin 500 mg qAM #30 with no refills and a prescription was written today. Clayton [name] agreed to continue with diet and exercise and will follow up with [name] as directed to monitor his progress in 4 weeks.   Obesity Clayton [name] is currently in the action stage of change. As such, his goal is to continue with weight loss  efforts, He has agreed to follow a lower carbohydrate, vegetable, and lean protein rich diet plan or keeping a food journal of 1800 calories and 100+ grams of protein daily. Clayton [name] has been instructed to work up to a goal of 150 minutes of combined cardio and strengthening exercise per week for weight loss and overall health benefits. We discussed the following Behavioral Modification Strategies today: dealing with family or coworker sabotage.  Clayton [name] has agreed to follow up with our clinic in 4 weeks. He was informed of the importance of frequent follow up visits to maximize his success with intensive lifestyle modifications for his multiple health conditions.  ALLERGIES: Allergies  Allergen Reactions  . Statins Other (See Comments)    Crestor - Intestinal issues     MEDICATIONS: Current Outpatient Medications on File Prior to Visit  Medication Sig Dispense Refill  . apixaban (ELIQUIS) 5 MG TABS tablet Take 1 tablet (5 mg total) by mouth 2 (two) times daily. 180 tablet 1  . Ascorbic Acid (VITAMIN C) 1000 MG tablet Take 1,000 mg by mouth daily.     Marland Kitchen aspirin EC 81 MG tablet Take 1 tablet (81 mg total) by mouth daily. 90 tablet 3  . [place]well[place]-Glucosamine-Vit D (GLUCOSAMINE COMPLEX PO) Take 3 tablets by mouth every morning.     . Coenzyme Q10 200 MG capsule Take 200 mg by mouth daily.     Marland Kitchen diltiazem (CARDIZEM CD) 180 MG 24 hr capsule Take  1 capsule (180 mg total) by mouth daily. 90 capsule 3  . ezetimibe (ZETIA) 10 MG tablet TAKE 1 TABLET BY MOUTH EVERY DAY 90 tablet 3  . LIVALO 2 MG TABS TAKE 1 TABLET BY MOUTH EVERY DAY 90 tablet 3  . metoprolol succinate (TOPROL XL) 25 MG 24 hr tablet Take 25 mg in the morning (along with 100 mg in the evening) for 5 days- then if pulse still in 70's increase to 50 mg in the morning (along with 100 mg in the evening) (Patient taking differently: Take 50 mg by mouth every morning. Take 50 mg in the morning (along with 100 mg in the evening) for 5 days- then  if pulse still in 70's increase to 50 mg in the morning (along with 100 mg in the evening)) 90 tablet 1  . metoprolol succinate (TOPROL-XL) 100 MG 24 hr tablet Take 100 mg in the evenings (along with 25 mg or 50 mg in the mornings) (Patient taking differently: Take 100 mg by mouth every morning. Take 100 mg in the evenings (along with 50 mg in the mornings)) 90 tablet 3  . Multiple Vitamin (MULITIVITAMIN WITH MINERALS) TABS Take 1 tablet by mouth every evening.     . olmesartan (BENICAR) 20 MG tablet Take 1 tablet (20 mg total) by mouth daily. 90 tablet 1  . Omega 3-6-9 Fatty Acids (OMEGA-3-6-9 PO) Take 3 capsules by mouth every morning. Omega 3= 800mg ,omega 6=380mg ,omega 9 =300mg     . spironolactone (ALDACTONE) 25 MG tablet TAKE 1/2 TABLET DAILY (Patient taking differently: Take 12.5 mg by mouth daily. ) 45 tablet 3  . triamcinolone cream (KENALOG) 0.5 % APPLY 1 APPLICATION TOPICALLY 2 (TWO) TIMES DAILY. TO AFFECTED AREAS. (Patient taking differently: Apply 1 application topically 2 (two) times daily as needed (irritation). To affected areas.) 30 g 3  . zinc gluconate 50 MG tablet Take 50 mg by mouth every evening.     Marland Kitchen Zn-Pyg Afri-Nettle-Saw Palmet (SAW PALMETTO COMPLEX PO) Take 1 capsule by mouth every morning.      No current facility-administered medications on file prior to visit.     PAST MEDICAL HISTORY: Past Medical History:  Diagnosis Date  . Arthritis    hips   . Atrial fibrillation ([place])   . Cataract   . Dysrhythmia    PAF- hx of 7 years ago   . HLD (hyperlipidemia)   . Hypertension 05/29/10   ECHO-EF 67% wnl  . Meniere disease   . Obesity   . Palpitations   . Sleep apnea 09/14/04   [facility]- Dr. Humphrey Rolls; CPAP titration 11/12/04- Dr. Humphrey Rolls    PAST SURGICAL HISTORY: Past Surgical History:  Procedure Laterality Date  . CARDIAC CATHETERIZATION  11/03/05  . CARDIOVERSION N/A 09/26/2018   Procedure: CARDIOVERSION;  Surgeon: Geralynn Rile, MD;   Location: [facility];  Service: Endoscopy;  Laterality: N/A;  . CATARACT EXTRACTION  2014  . KNEE ARTHROSCOPY     left knee- 2002   . TOTAL HIP ARTHROPLASTY  06/08/2011   Procedure: TOTAL HIP ARTHROPLASTY ANTERIOR APPROACH;  Surgeon: [name], MD;  Location: [facility];  Service: Orthopedics;  Laterality: Left;    SOCIAL HISTORY: Social History   Tobacco Use  . Smoking status: Never Smoker  . Smokeless tobacco: Never Used  Substance Use Topics  . Alcohol use: No  . Drug use: No    FAMILY HISTORY: Family History  Problem Relation Age of Onset  . Hypertension Mother   .  Stroke Mother   . Diabetes Mother   . Hyperlipidemia Mother   . Obesity Mother   . Diabetes Sister   . Stroke Maternal Grandmother        34 died   . Heart disease Maternal Grandfather   . Hypertension Maternal Grandfather   . Diabetes Maternal Grandfather   . Arthritis Father   . Heart disease Father   . Heart disease Paternal Grandfather     ROS: Review of Systems  Constitutional: Positive for weight loss.  Gastrointestinal: Negative for nausea and vomiting.  Endo/Heme/Allergies:       Negative for hypoglycemia.    PHYSICAL EXAM: Blood pressure 132/80, pulse 61, temperature 98 F (36.7 C), temperature source Oral, height 6\' 1"  (1.854 m), weight 223 lb (101.2 kg), SpO2 97 %. Body mass index is 29.42 kg/m. Physical Exam Vitals signs reviewed.  Constitutional:      Appearance: Normal appearance. He is obese.  Cardiovascular:     Rate and Rhythm: Normal rate.  Pulmonary:     Effort: Pulmonary effort is normal.  Musculoskeletal: Normal range of motion.  Skin:    General: Skin is warm and dry.  Neurological:     Mental Status: He is alert and oriented to person, place, and time.  Psychiatric:        Mood and Affect: Mood normal.        Behavior: Behavior normal.     RECENT LABS AND TESTS: BMET    Component Value Date/Time   NA 141 09/22/2018 1016   K 4.5 09/22/2018 1016   CL 102  09/22/2018 1016   CO2 22 09/22/2018 1016   GLUCOSE 98 09/22/2018 1016   GLUCOSE 111 (H) 10/12/2016 0902   BUN 30 (H) 09/22/2018 1016   CREATININE 1.69 (H) 09/22/2018 1016   CREATININE 1.32 (H) 10/12/2016 0902   CALCIUM 10.3 (H) 09/22/2018 1016   GFRNONAA 40 (L) 09/22/2018 1016   GFRNONAA 54 (L) 10/12/2016 0902   GFRAA 46 (L) 09/22/2018 1016   GFRAA 63 10/12/2016 0902   Lab Results  Component Value Date   HGBA1C 5.8 (H) 09/04/2018   HGBA1C 6.0 (H) 11/01/2017   HGBA1C 5.4 04/14/2016   HGBA1C 6.0 06/09/2015   Lab Results  Component Value Date   INSULIN 9.0 09/04/2018   INSULIN 22.4 11/01/2017   CBC    Component Value Date/Time   WBC 6.1 09/22/2018 1016   WBC 5.4 12/08/2015 0945   RBC 4.88 09/22/2018 1016   RBC 4.87 12/08/2015 0945   HGB 15.4 09/22/2018 1016   HCT 46.2 09/22/2018 1016   PLT 187 09/22/2018 1016   MCV 95 09/22/2018 1016   MCH 31.6 09/22/2018 1016   MCH 31.2 12/08/2015 0945   MCHC 33.3 09/22/2018 1016   MCHC 34.5 12/08/2015 0945   RDW 13.2 09/22/2018 1016   LYMPHSABS 1.5 08/27/2014 1030   MONOABS 0.5 08/27/2014 1030   EOSABS 0.1 08/27/2014 1030   BASOSABS 0.0 08/27/2014 1030   Iron/TIBC/Ferritin/ %Sat No results found for: IRON, TIBC, FERRITIN, IRONPCTSAT Lipid Panel     Component Value Date/Time   CHOL 117 08/07/2018 0901   TRIG 67 08/07/2018 0901   HDL 50 08/07/2018 0901   CHOLHDL 2.3 08/07/2018 0901   CHOLHDL 3.8 10/12/2016 0902   VLDL 17 04/14/2016 0937   LDLCALC 54 08/07/2018 0901   LDLCALC 119 (H) 10/12/2016 0902   Hepatic Function Panel     Component Value Date/Time   PROT 6.7 09/22/2018 1016   ALBUMIN  4.8 (H) 09/22/2018 1016   AST 27 09/22/2018 1016   ALT 27 09/22/2018 1016   ALKPHOS 52 09/22/2018 1016   BILITOT 1.4 (H) 09/22/2018 1016      Component Value Date/Time   TSH 1.290 08/09/2018 1448   TSH 1.28 12/08/2015 0945   TSH 0.60 06/09/2015 1056   Results for Clayton [name], Clayton L "JERRY" (MRN [id number]) as of 10/03/2018  17:11  Ref. Range 09/04/2018 09:02  Vitamin D, 25-Hydroxy Latest Ref Range: 30.0 - 100.0 ng/mL 43.2    OBESITY BEHAVIORAL INTERVENTION VISIT  Today's visit was # 13  Starting weight: 266 lbs Starting date: 11/01/17 Today's weight : Weight: 223 lb (101.2 kg)  Today's date: 10/02/2018 Total lbs lost to date: 43 At least 15 minutes were spent on discussing the following behavioral intervention visit.   10/02/2018  Height 6\' 1"  (1.854 m)  Weight 223 lb (101.2 kg)  BMI (Calculated) 29.43  BLOOD PRESSURE - SYSTOLIC [id number]  BLOOD PRESSURE - DIASTOLIC 80   Body Fat % [id number] %  Total Body Water (lbs) 106.8 lbs   ASK: We discussed the diagnosis of obesity with Clayton [name] today and Clayton [name] agreed to give [name] permission to discuss obesity behavioral modification therapy today.  ASSESS: Clayton [name] has the diagnosis of obesity and his BMI today is 29.43. Clayton [name] is in the action stage of change.   ADVISE: Clayton [name] was educated on the multiple health risks of obesity as well as the benefit of weight loss to improve his health. He was advised of the need for long term treatment and the importance of lifestyle modifications to improve his current health and to decrease his risk of future health problems.  AGREE: Multiple dietary modification options and treatment options were discussed and Clayton [name] agreed to follow the recommendations documented in the above note.  ARRANGE: Clayton [name] was educated on the importance of frequent visits to treat obesity as outlined per CMS and USPSTF guidelines and agreed to schedule his next follow up appointment today.  IMarcille Blanco, CMA, am acting as transcriptionist for Starlyn Skeans, MD I have reviewed the above documentation for accuracy and completeness, and I agree with the above. -Dennard Nip, MD

## 2018-10-05 NOTE — Telephone Encounter (Signed)
Scheduled appointment for next week with Dr Claiborne Billings

## 2018-10-05 NOTE — Telephone Encounter (Signed)
Follow up    Patient is returning call. I did offer the PA patient declined and states that he has no interest in seeing a PA.

## 2018-10-05 NOTE — Telephone Encounter (Signed)
° °  Called patient, no answer, left vm to call back and schedule follow up visit.

## 2018-10-10 ENCOUNTER — Encounter: Payer: Self-pay | Admitting: Cardiovascular Disease

## 2018-10-10 ENCOUNTER — Other Ambulatory Visit: Payer: Self-pay

## 2018-10-10 ENCOUNTER — Ambulatory Visit (INDEPENDENT_AMBULATORY_CARE_PROVIDER_SITE_OTHER): Payer: Medicare Other | Admitting: Cardiovascular Disease

## 2018-10-10 VITALS — BP 142/90 | HR 69 | Temp 96.5°F | Ht 75.0 in | Wt 229.0 lb

## 2018-10-10 DIAGNOSIS — Z79899 Other long term (current) drug therapy: Secondary | ICD-10-CM

## 2018-10-10 DIAGNOSIS — I10 Essential (primary) hypertension: Secondary | ICD-10-CM | POA: Diagnosis not present

## 2018-10-10 DIAGNOSIS — Z7901 Long term (current) use of anticoagulants: Secondary | ICD-10-CM

## 2018-10-10 DIAGNOSIS — I4819 Other persistent atrial fibrillation: Secondary | ICD-10-CM

## 2018-10-10 DIAGNOSIS — Z9989 Dependence on other enabling machines and devices: Secondary | ICD-10-CM

## 2018-10-10 DIAGNOSIS — G4733 Obstructive sleep apnea (adult) (pediatric): Secondary | ICD-10-CM

## 2018-10-10 DIAGNOSIS — N183 Chronic kidney disease, stage 3 unspecified: Secondary | ICD-10-CM

## 2018-10-10 NOTE — Patient Instructions (Signed)
Medication Instructions:  The current medical regimen is effective;  continue present plan and medications as directed. Please refer to the Current Medication list given to you today. If you need a refill on your cardiac medications before your next appointment, please call your pharmacy.  Labwork: BMET TODAY HERE IN OUR OFFICE AT [facility]    Take the provided lab slips with you to the lab for your blood draw.   When you have your labs (blood work) drawn today and your tests are completely normal, you will receive your results only by MyChart Message (if you have MyChart) -OR-  A paper copy in the mail.  If you have any lab test that is abnormal or we need to change your treatment, we will call you to review these results.  Follow-Up: You will need a follow up appointment in 3 months.  You may see Shelva Majestic, MD or one of the following Advanced Practice Providers on your designated Care Team: Almyra Deforest, PA-C Fabian Sharp, PA-C     At [facility], you and your health needs are our priority.  As part of our continuing mission to provide you with exceptional heart care, we have created designated Provider Care Teams.  These Care Teams include your primary Cardiologist (physician) and Advanced Practice Providers (APPs -  Physician Assistants and Nurse Practitioners) who all work together to provide you with the care you need, when you need it.  Thank you for choosing [facility] at [facility]!!

## 2018-10-10 NOTE — Progress Notes (Signed)
Patient ID: Clayton [name], male   DOB: [birth year], 73 y.o.   MRN: [id number]     HPI: Clayton [name] is a 73 y.o. male who presents for a 4 week follow-up cardiology and sleep evaluation.  Clayton [name] has a history of hypertension, a remote history of PAF, status post cardioversion in 2007, obstructive sleep apnea for which he uses CPAP 100% of the time, hypertension, and hyperlipidemia. He has had weight fluctuations over the years.  In the past, he also had a history of Mnire's disease.  There was some concern by Clayton [name] that perhaps this may have been related to statin use, which led to its discontinuance.  He is not had Mnire's disease in some time.  He underwent left hip replacement surgery by Clayton [name] and now has been able to continue to be active and exercises routinely.   His  blood pressure regimen has been amlodipine/benazepril 10/40 daily, hydrochlorothiazide has been taking 25 Mill grams in the evening and Toprol-XL 100 mg daily. He had atrial of livalo  For hyperlipidemia but developed some diarrhea secondary to this.    He has not had much success with weight loss over the past several years.    He continues to use his CPAP therapy with 100% compliance.  He will not even take a nap without his CPAP therapy and he takes his CPAP unit on alll his travels.   Since initiating CPAP therapy, he is unaware of any palpitations.  He denies any recurrent atrial fibrillation.  In the past he has had difficulty with family stress and his father had passed away, and his wife's mother recently died after having developed lung CA and had significant PVD.  He was not exercising as much as he had in the past.  He has not been successful in weight loss.  He continues to be active and still walks 3-4 miles per day.  Oftentimes in the morning when he awakens his blood pressure is elevated at 150/90 but after exercise it drops to 128/80.  He is unaware of any recurrent atrial fibrillation.  He  continues to use CPAP with 100% compliance.   He completed a two-year grieving process.  On 01/19/2016 he committed to weight loss.  On January 1, he weighed 278 pounds .  He has been walking 4 miles 5 days per week as well as some intermittent stationary bike.  He has lost a total of 32 pounds since January.  He continues to use CPAP with 100% compliance and since he has been on CPAP, he has not had any recurrent atrial fibrillation and his blood pressure has been less labile.  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  He has had failures to Crestor and Lipitor in the past due to significant myalgias, development, even with weekly dosing.  I resumed zetia   In August 2018 he noticed his heart rate increasing and he presented for evaluation and was seen by Clayton [name], PAC.  He admitted to a rare palpitation.  He denied any chest pain with exertion or dyspnea on exertion.  He has suffered a broken rib.  This year.  He has a weight goal of 220 pounds.    I  saw him in September 2018, at which time he was doing well from a cardiovascular standpoint.   His father was ill and ultimately passed away in [place].  As result he was going back and forth to the [facility] area.  This resulted in a change in his diet and ability to exercise.  I last saw him in October 2019 and his weight had increased from 240 back up to 270 pounds.  He was unaware of any episodes of atrial fibrillation.  He was using CPAP with 100% compliance along with his naps.  He was committed to begin weight loss again.    Since I saw him on October 19, 2018 he has had major lifestyle change.  He has been working with Clayton [name] in the healthy weight and wellness group.  Peak weight was 280 and most recent weight 244.  I saw him for a cardiology evaluation on August 09, 2018.  O ver the past 4 months, he had been under increased stress as result of his wife's illness.  He has been checking his blood pressure regularly and this has  been stable but on his blood pressure recording he has noticed the message of heart rate irregularity.  He is unaware of A. fib but has noticed this message for the last 3 to 4 months.  He is exercising every day and typically walks 2 miles in the morning and in the afternoon or evening does 30 minutes of stationary bike at least 5 to 6 days/week.  He continues to use CPAP.  I obtained a download in the office today from June 22 through August 08, 2018.  He is 100% compliant.  He has a ResMed air sense 10 auto unit with a pressure range of 8-20 with 95% pressure at 10.1 and maximum average and 11.5.  AHI is excellent at 1.2.     During his August 09, 2018 encounter his ECG demonstrated that he was back in atrial fibrillation and had a ventricular rate at 89 bpm.  There was LVH with repolarization changes.  At that time, I recommended discontinuance of amlodipine/benazepril combination and in its place started Cardizem CD 180 mg rather than amlodipine and olmesartan 20 mg rather than benazepril.  I initiated anticoagulation with Eliquis 5 mg twice a day.  He underwent a 2D echo Doppler study on August 16, 2018 which showed EF low normal at 50 to 55%.  There was evidence for significant LVH and moderate dilation of his left atrium.  I saw him for follow-up evaluation on August 23, 2018.  At that time he was now cognizant of his heart rate irregularity.  He has continued to use CPAP with excellent compliance.  A new download was obtained from July 5 through August 21, 2018 which shows 100% compliance with average usage 7 hours and 28 minutes.  AHI is 1.6 and his 95th percentile auto pressure is 10.1 with a maximum average pressure of 11.4.  He states he has lost weight over the past several weeks purposefully.  He continues to exercise now on a stationary bike.  He denies any chest pain PND orthopnea.  He was tolerating Eliquis without bleeding.  During that evaluation, we discussed different options including several  additional weeks of increased weight control versus initiating antiarrhythmic therapy.  Since his only other episode of atrial fibrillation previously occurred 13 years ago and he had not had any recurrence until this year he opted for an increased rate control trial.  As result metoprolol dose was further titrated.  I saw on September 15, 2018 for follow-up evaluation.  At that time remained in atrial fibrillation despite his increase metoprolol succinate to 100 mg in the morning and 50 mg at night with continuation of  diltiazem 180 mg, spironolactone 12.5 mg daily in addition to his olmesartan 20 mg.  I scheduled him for DC cardioversion but because of the need to obtain a COVID test I was unable to schedule this to be done by me prior to going on vacation.  He ultimately underwent the successful cardioversion by Dr. Davina Poke successfully and received 1 shock of 200 J of biphasic synchronized rhythm with restoration of sinus rhythm.  Chemistry profile done prior to the cardioversion revealed his creatinine had increased to 1.69 and as result I recommended he reduce his olmesartan down to 10 mg from his dose of 20 mg.  Since undergoing the cardioversion he has felt well.  His stress level is significantly reduced since his wife had successful L5-S1 back surgery by Dr. Vertell Limber.  He denies any chest pain.  He continues to use CPAP.  He denies edema.  He presents for reevaluation.  Past Medical History:  Diagnosis Date   Arthritis    hips    Atrial fibrillation (HCC)    Cataract    Dysrhythmia    PAF- hx of 7 years ago    HLD (hyperlipidemia)    Hypertension 05/29/10   ECHO-EF 67% wnl   Meniere disease    Obesity    Palpitations    Sleep apnea 09/14/04   [facility]- Dr. Humphrey Rolls; CPAP titration 11/12/04- Dr. Humphrey Rolls    Past Surgical History:  Procedure Laterality Date   CARDIAC CATHETERIZATION  11/03/05   CARDIOVERSION N/A 09/26/2018   Procedure: CARDIOVERSION;  Surgeon:  Geralynn Rile, MD;  Location: [facility];  Service: Endoscopy;  Laterality: N/A;   CATARACT EXTRACTION  2014   KNEE ARTHROSCOPY     left knee- 2002    TOTAL HIP ARTHROPLASTY  06/08/2011   Procedure: TOTAL HIP ARTHROPLASTY ANTERIOR APPROACH;  Surgeon: [name], MD;  Location: [facility];  Service: Orthopedics;  Laterality: Left;    Allergies  Allergen Reactions   Statins Other (See Comments)    Crestor - Intestinal issues     Current Outpatient Medications  Medication Sig Dispense Refill   apixaban (ELIQUIS) 5 MG TABS tablet Take 1 tablet (5 mg total) by mouth 2 (two) times daily. 180 tablet 1   Ascorbic Acid (VITAMIN C) 1000 MG tablet Take 1,000 mg by mouth daily.      aspirin EC 81 MG tablet Take 1 tablet (81 mg total) by mouth daily. 90 tablet 3   Boswellia-Glucosamine-Vit D (GLUCOSAMINE COMPLEX PO) Take 3 tablets by mouth every morning.      Coenzyme Q10 200 MG capsule Take 200 mg by mouth daily.      diltiazem (CARDIZEM CD) 180 MG 24 hr capsule Take 1 capsule (180 mg total) by mouth daily. 90 capsule 3   ezetimibe (ZETIA) 10 MG tablet TAKE 1 TABLET BY MOUTH EVERY DAY 90 tablet 3   LIVALO 2 MG TABS TAKE 1 TABLET BY MOUTH EVERY DAY 90 tablet 3   metFORMIN (GLUCOPHAGE) 500 MG tablet Take 1 tablet (500 mg total) by mouth daily with breakfast. 30 tablet 0   metoprolol succinate (TOPROL XL) 25 MG 24 hr tablet Take 25 mg in the morning (along with 100 mg in the evening) for 5 days- then if pulse still in 70's increase to 50 mg in the morning (along with 100 mg in the evening) (Patient taking differently: Take 50 mg by mouth every morning. Take 50 mg in the morning (along with 100 mg in the  evening) for 5 days- then if pulse still in 70's increase to 50 mg in the morning (along with 100 mg in the evening)) 90 tablet 1   metoprolol succinate (TOPROL-XL) 100 MG 24 hr tablet Take 100 mg in the evenings (along with 25 mg or 50 mg in the mornings) (Patient taking  differently: Take 100 mg by mouth every morning. Take 100 mg in the evenings (along with 50 mg in the mornings)) 90 tablet 3   Multiple Vitamin (MULITIVITAMIN WITH MINERALS) TABS Take 1 tablet by mouth every evening.      olmesartan (BENICAR) 20 MG tablet Take 1 tablet (20 mg total) by mouth daily. 90 tablet 1   Omega 3-6-9 Fatty Acids (OMEGA-3-6-9 PO) Take 3 capsules by mouth every morning. Omega 3= 853m,omega 6=3813momega 9 =30061m   spironolactone (ALDACTONE) 25 MG tablet TAKE 1/2 TABLET DAILY (Patient taking differently: Take 12.5 mg by mouth daily. ) 45 tablet 3   triamcinolone cream (KENALOG) 0.5 % APPLY 1 APPLICATION TOPICALLY 2 (TWO) TIMES DAILY. TO AFFECTED AREAS. (Patient taking differently: Apply 1 application topically 2 (two) times daily as needed (irritation). To affected areas.) 30 g 3   zinc gluconate 50 MG tablet Take 50 mg by mouth every evening.      Zn-Pyg Afri-Nettle-Saw Palmet (SAW PALMETTO COMPLEX PO) Take 1 capsule by mouth every morning.      No current facility-administered medications for this visit.     Socially he is remarried after his first wife passed away. He has 2 children. 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 is no tobacco history.  He plays golf at least 2 days/week and this past year not only did he have a hole in 1 but he has also shot his age.  ROS General: Negative; No fevers, chills, or night sweats;  HEENT: Negative; No changes in vision or hearing, sinus congestion, difficulty swallowing Pulmonary: Negative; No cough, wheezing, shortness of breath, hemoptysis Cardiovascular: See HPI GI: Negative; No nausea, vomiting, diarrhea, or abdominal pain GU: Negative; No dysuria, hematuria, or difficulty voiding Musculoskeletal: He is status post rib fracture.  He is status post hip replacement. Hematologic/Oncology: Negative; no easy bruising, bleeding Endocrine: Negative; no heat/cold intolerance; no diabetes Neuro:  No vertigo or imbalance issues Skin: Negative; No rashes or skin lesions Psychiatric: Negative; No behavioral problems, depression Sleep: Positive for obstructive sleep apnea on CPAP therapy with 100% compliance No snoring, daytime sleepiness, hypersomnolence, bruxism, restless legs, hypnogognic hallucinations, no cataplexy Other comprehensive 14 point system review is negative.  PE BP (!) 142/90    Pulse 69    Temp (!) 96.5 F (35.8 C)    Ht 6' 3" (1.905 m)    Wt 229 lb (103.9 kg)    SpO2 97%    BMI 28.62 kg/m    Repeat blood pressure was 140/88  Wt Readings from Last 3 Encounters:  10/10/18 229 lb (103.9 kg)  10/02/18 223 lb (101.2 kg)  09/26/18 229 lb 4.5 oz (104 kg)   General: Alert, oriented, no distress.  Skin: normal turgor, no rashes, warm and dry HEENT: Normocephalic, atraumatic. Pupils equal round and reactive to light; sclera anicteric; extraocular muscles intact;  Nose without nasal septal hypertrophy Mouth/Parynx benign; Mallinpatti scale 3 Neck: No JVD, no carotid bruits; normal carotid upstroke Lungs: clear to ausculatation and percussion; no wheezing or rales Chest wall: without tenderness to palpitation Heart: PMI not displaced, RRR, heart rate in the 70s s1 s2 normal,  1/6 systolic murmur, no diastolic murmur, no rubs, gallops, thrills, or heaves Abdomen: soft, nontender; no hepatosplenomehaly, BS+; abdominal aorta nontender and not dilated by palpation. Back: no CVA tenderness Pulses 2+ Musculoskeletal: full range of motion, normal strength, no joint deformities Extremities: no clubbing cyanosis or edema, Homan's sign negative  Neurologic: grossly nonfocal; Cranial nerves grossly wnl Psychologic: Normal mood and affect  ECG (independently read by me): Normal sinus rhythm at 72 bpm.  Left Axis deviation.  QTc interval 429 ms.  September 15, 2018 ECG (independently read by me): Atrial fibrillation at 92 bpm.  Right bundle branch block with repolarization  changes.  August 23, 2018 ECG (independently read by me): Atrial fibrillation at 96 bpm.  LVH with repolarization changes.  Left axis deviation.  Left anterior hemiblock.  Inferior Q waves  August 09, 2018 ECG (independently read by me):Atrial fibrillatoion at 24; LVH with repolarization  October 219 ECG (independently read by me): Normal sinus rhythm at 81 bpm.  Nonspecific intraventricular conduction delay.  Left axis deviation  September 2018 ECG (independently read by me): Normal sinus rhythm at 63 bpm.  Left bundle branch block with repolarization changes.  Normal intervals.  No ectopy.  April 2018 ECG (independently read by me): Normal sinus rhythm at 68 bpm.  LVH with repolarization.  Normal intervals.  November 2017 ECG (independently read by me): Normal sinus rhythm at 78 bpm.  Nonspecific interventricular delay.  QRS complex V1 V2.  August 2016 ECG (independently read by me): Normal sinus rhythm at 77 bpm.  LVH with repolarization changes.  Left axis deviation.  December 2015 ECG (independently read by me): Normal sinus rhythm at 87 bpm.  LVH by voltage criteria with T-wave changes in leads 1 and aVL.  July 2014 ECG: Normal sinus rhythm at 76 beats per minute. LVH with QRS widening.  LABS:  BMP Latest Ref Rng & Units 09/22/2018 09/04/2018 08/07/2018  Glucose 65 - 99 mg/dL 98 115(H) 110(H)  BUN 8 - 27 mg/dL 30(H) 28(H) 26  Creatinine 0.76 - 1.27 mg/dL 1.69(H) 1.43(H) 1.44(H)  BUN/Creat Ratio 10 - _0 Sodium 134 - 144 mmol/L 141 139 141  Potassium 3.5 - 5.2 mmol/L 4.5 4.6 4.8  Chloride 96 - 106 mmol/L 102 106 104  CO2 20 - 29 mmol/L _1 Calcium 8.6 - 10.2 mg/dL 10.3(H) 9.9 9.3   Hepatic Function Latest Ref Rng & Units 09/22/2018 09/04/2018 08/07/2018  Total Protein 6.0 - 8.5 g/dL 6.7 6.7 6.5  Albumin 3.7 - 4.7 g/dL 4.8(H) 4.5 4.4  AST 0 - 40 IU/L _2 ALT 0 - 44 IU/L _3 Alk Phosphatase 39 - 117 IU/L 52 50 49  Total Bilirubin 0.0 - 1.2 mg/dL 1.4(H) 1.0  1.3(H)   CBC Latest Ref Rng & Units 09/22/2018 08/09/2018 12/08/2015  WBC 3.4 - 10.8 x10E3/uL 6.1 6.6 5.4  Hemoglobin 13.0 - 17.7 g/dL 15.4 15.7 15.2  Hematocrit 37.5 - 51.0 % 46.2 47.5 44.1  Platelets 150 - 450 x10E3/uL 187 208 194   Lab Results  Component Value Date   MCV 95 09/22/2018   MCV 92 08/09/2018   MCV 90.6 12/08/2015   Lab Results  Component Value Date   TSH 1.290 08/09/2018    Lab Results  Component Value Date   HGBA1C 5.8 (H) 09/04/2018    Lipid Panel     Component Value Date/Time   CHOL 117 08/07/2018 0901   TRIG 67 08/07/2018 0901  HDL 50 08/07/2018 0901   CHOLHDL 2.3 08/07/2018 0901   CHOLHDL 3.8 10/12/2016 0902   VLDL 17 04/14/2016 0937   LDLCALC 54 08/07/2018 0901   LDLCALC 119 (H) 10/12/2016 0902     RADIOLOGY: No results found.  IMPRESSION:  1. Medication management   2. Essential hypertension   3. Persistent atrial fibrillation: s/p DC cardioversion 09/26/2018   4. OSA on CPAP   5. CKD (chronic kidney disease), stage III ([place])   6. Anticoagulated      ASSESSMENT AND PLAN: Mr Hoopes is a 73 year old gentleman who has a long-standing history of hypertension and had been on amlodipine/benazepril 10/40, HCTZ 25 mg, Toprol-XL 100 mg in addition to spironolactone 12.5 mg daily.  On this regimen his blood pressure had stabilized and consistently was well controlled.   He had gained significant amount of weight since August 2019 he initiated a major  lifestyle change and has been working closely with Clayton [name] from healthy weight and wellness group now admits to close to 50 pound weight loss .  For the previous 5 months, he was under significantly increased stress with the illness of his wife.  When I saw him in late July 2020 his ECG confirmed that he was in atrial fibrillation and I suspect that this may have been ongoing for at least 3 to 4 months prior to that evaluation without his awareness.   I had gradually titrated his medical regimen due to  persistent atrial fibrillation he underwent cardioversion with successful restoration of sinus rhythm on September 26, 2018.  Of note, laboratory prior to that cardioversion had shown creatinine increasing to 1.69 and as result I had reduced olmesartan to 10 mg.  Over the past 2 weeks, he has felt well and has more energy back in sinus rhythm.  He has noticed that his blood pressure has increased slightly.  He denies any leg swelling.  He has continued to use CPAP with 1% compliance.  I will check a bmet since he has been on the reduced dose of olmesartan 10 mg.  He will monitor his blood pressure at home.  If his blood pressure continues to be increased he may require possible further titration of diltiazem to 240 mg daily.  He continues to be on Zetia and Livalo for his hyperlipidemia.  He is on metformin for diabetes mellitus.  He is not having any bleeding on Eliquis 5 mg twice a day.  If he were to develop recurrent atrial fibrillation, antiarrhythmic therapy may need to be instituted.  I will see him in 3 months for reevaluation.  Time spent: 25 minutes  [name], MD, [facility]  10/10/2018 12:36 PM

## 2018-10-11 ENCOUNTER — Other Ambulatory Visit (INDEPENDENT_AMBULATORY_CARE_PROVIDER_SITE_OTHER): Payer: Self-pay | Admitting: Family Medicine

## 2018-10-11 DIAGNOSIS — R7303 Prediabetes: Secondary | ICD-10-CM

## 2018-10-11 LAB — BASIC METABOLIC PANEL
BUN/Creatinine Ratio: 25 — ABNORMAL HIGH (ref 10–24)
BUN: 35 mg/dL — ABNORMAL HIGH (ref 8–27)
CO2: 22 mmol/L (ref 20–29)
Calcium: 10.3 mg/dL — ABNORMAL HIGH (ref 8.6–10.2)
Chloride: 103 mmol/L (ref 96–106)
Creatinine, Ser: 1.42 mg/dL — ABNORMAL HIGH (ref 0.76–1.27)
GFR calc Af Amer: 57 mL/min/{1.73_m2} — ABNORMAL LOW (ref >59)
GFR calc non Af Amer: 49 mL/min/{1.73_m2} — ABNORMAL LOW (ref >59)
Glucose: 95 mg/dL (ref 65–99)
Potassium: 4.8 mmol/L (ref 3.5–5.2)
Sodium: 140 mmol/L (ref 134–144)

## 2018-10-20 ENCOUNTER — Other Ambulatory Visit: Payer: Self-pay | Admitting: Cardiovascular Disease

## 2018-10-30 ENCOUNTER — Encounter (INDEPENDENT_AMBULATORY_CARE_PROVIDER_SITE_OTHER): Payer: Self-pay | Admitting: Family Medicine

## 2018-10-30 ENCOUNTER — Ambulatory Visit (INDEPENDENT_AMBULATORY_CARE_PROVIDER_SITE_OTHER): Payer: Medicare Other | Admitting: Family Medicine

## 2018-10-30 ENCOUNTER — Other Ambulatory Visit: Payer: Self-pay

## 2018-10-30 VITALS — BP 151/82 | HR 62 | Temp 97.8°F | Ht 75.0 in | Wt 226.0 lb

## 2018-10-30 DIAGNOSIS — R7303 Prediabetes: Secondary | ICD-10-CM | POA: Diagnosis not present

## 2018-10-30 DIAGNOSIS — N189 Chronic kidney disease, unspecified: Secondary | ICD-10-CM | POA: Diagnosis not present

## 2018-10-30 DIAGNOSIS — Z6830 Body mass index (BMI) 30.0-30.9, adult: Secondary | ICD-10-CM | POA: Diagnosis not present

## 2018-10-30 DIAGNOSIS — E669 Obesity, unspecified: Secondary | ICD-10-CM | POA: Diagnosis not present

## 2018-10-30 MED ORDER — METFORMIN HCL 500 MG PO TABS: 500.0000 mg | ORAL_TABLET | Freq: Every day | ORAL | 0 refills | Status: DC

## 2018-10-30 NOTE — Progress Notes (Signed)
Office: [phone]  /  Fax: [phone]   HPI:   Chief Complaint: OBESITY Clayton [name] is here to discuss his progress with his obesity treatment plan. He is keeping a food journal with 1800 calories and 100+ grams of protein  and is following his eating plan approximately 60 to 70 % of the time. He states he is biking and walking 45 to 90 minutes 5 to 7 times per week. Clayton [name] is retaining some fluid today. He stopped his low carb plan and changed to journaling. Frequently, there is an increase in water weight when that happens.  His weight is 226 lb (102.5 kg) today and has had a weight gain of 3 pounds over a period of 4 weeks since her last visit. He has lost 40 lbs since starting treatment with [name].  Pre-Diabetes Clayton [name] has a diagnosis of pre-diabetes based on his elevated Hgb A1c and was informed this puts him at greater risk of developing diabetes. He is doing well on his diet and metformin. His GFR is greater than 30. Clayton [name] continues to work on diet and exercise to decrease risk of diabetes. He denies nausea, vomiting, or hypoglycemia.   Chronic Renal Impairment Clayton [name] GFR has improved. He is working on blood pressure control and increasing water intake. Clayton [name] blood pressure is not at goal today.  ASSESSMENT AND PLAN:  Prediabetes - Plan: metFORMIN (GLUCOPHAGE) 500 MG tablet  Chronic renal impairment, unspecified CKD stage  Class 1 obesity with serious comorbidity and body mass index (BMI) of 30.0 to 30.9 in adult, unspecified obesity type - BMI greater than 30 at start of program   PLAN:  Pre-Diabetes Clayton [name] will continue to work on weight loss, exercise, and decreasing simple carbohydrates in his diet to help decrease the risk of diabetes. He was informed that eating too many simple carbohydrates or too many calories at one sitting increases the likelihood of GI side effects. Clayton [name] agreed to continue metformin 500 mg qAM #30 with no refills and a prescription was written  today. Clayton [name] agreed to continue his diet and exercise and agreed to follow up with [name] as directed to monitor his progress in 3 to 4 weeks.   Chronic Renal Impairment We will continue to monitor Clayton [name] closely. If his GFR decreases below 30, we will discontinue metformin. Clayton [name] agrees to follow up in 3 to 4 weeks as directed.  Obesity Clayton [name] is currently in the action stage of change. As such, his goal is to continue with weight loss efforts. He has agreed to keep a food journal with 1800 calories and 100 grams of protein or follow a lower carbohydrate, vegetable, and lean protein rich diet plan Clayton [name] has been instructed to work up to a goal of 150 minutes of combined cardio and strengthening exercise per week for weight loss and overall health benefits. We discussed the following Behavioral Modification Strategies today: increasing lean protein intake, no skipping meals, and decreasing simple carbohydrates,   Clayton [name] has agreed to follow up with our clinic in 3 to 4 weeks. He was informed of the importance of frequent follow up visits to maximize his success with intensive lifestyle modifications for his multiple health conditions.  ALLERGIES: Allergies  Allergen Reactions  . Statins Other (See Comments)    Crestor - Intestinal issues     MEDICATIONS: Current Outpatient Medications on File Prior to Visit  Medication Sig Dispense Refill  . apixaban (ELIQUIS) 5 MG TABS tablet Take 1 tablet (5 mg total) by mouth 2 (two) times  daily. 180 tablet 1  . Ascorbic Acid (VITAMIN C) 1000 MG tablet Take 1,000 mg by mouth daily.     Marland Kitchen aspirin EC 81 MG tablet Take 1 tablet (81 mg total) by mouth daily. 90 tablet 3  . Boswellia-Glucosamine-Vit D (GLUCOSAMINE COMPLEX PO) Take 3 tablets by mouth every morning.     . Coenzyme Q10 200 MG capsule Take 200 mg by mouth daily.     Marland Kitchen diltiazem (CARDIZEM CD) 180 MG 24 hr capsule Take 1 capsule (180 mg total) by mouth daily. 90 capsule 3  . ezetimibe (ZETIA) 10  MG tablet TAKE 1 TABLET BY MOUTH EVERY DAY 90 tablet 3  . LIVALO 2 MG TABS TAKE 1 TABLET BY MOUTH EVERY DAY 90 tablet 3  . metoprolol succinate (TOPROL XL) 25 MG 24 hr tablet Take 25 mg in the morning (along with 100 mg in the evening) for 5 days- then if pulse still in 70's increase to 50 mg in the morning (along with 100 mg in the evening) (Patient taking differently: Take 50 mg by mouth every morning. Take 50 mg in the morning (along with 100 mg in the evening) for 5 days- then if pulse still in 70's increase to 50 mg in the morning (along with 100 mg in the evening)) 90 tablet 1  . metoprolol succinate (TOPROL-XL) 100 MG 24 hr tablet Take 100 mg in the evenings (along with 25 mg or 50 mg in the mornings) (Patient taking differently: Take 100 mg by mouth every morning. Take 100 mg in the evenings (along with 50 mg in the mornings)) 90 tablet 3  . Multiple Vitamin (MULITIVITAMIN WITH MINERALS) TABS Take 1 tablet by mouth every evening.     . olmesartan (BENICAR) 20 MG tablet Take 1 tablet (20 mg total) by mouth daily. 90 tablet 1  . Omega 3-6-9 Fatty Acids (OMEGA-3-6-9 PO) Take 3 capsules by mouth every morning. Omega 3= 800mg ,omega 6=380mg ,omega 9 =300mg     . spironolactone (ALDACTONE) 25 MG tablet TAKE 1/2 TABLET BY MOUTH EVERY DAY 45 tablet 3  . triamcinolone cream (KENALOG) 0.5 % APPLY 1 APPLICATION TOPICALLY 2 (TWO) TIMES DAILY. TO AFFECTED AREAS. (Patient taking differently: Apply 1 application topically 2 (two) times daily as needed (irritation). To affected areas.) 30 g 3  . zinc gluconate 50 MG tablet Take 50 mg by mouth every evening.     Marland Kitchen Zn-Pyg Afri-Nettle-Saw Palmet (SAW PALMETTO COMPLEX PO) Take 1 capsule by mouth every morning.      No current facility-administered medications on file prior to visit.     PAST MEDICAL HISTORY: Past Medical History:  Diagnosis Date  . Arthritis    hips   . Atrial fibrillation ([place])   . Cataract   . Dysrhythmia    PAF- hx of 7 years ago   . HLD  (hyperlipidemia)   . Hypertension 05/29/10   ECHO-EF 67% wnl  . Meniere disease   . Obesity   . Palpitations   . Sleep apnea 09/14/04   [facility]- Dr. Humphrey Rolls; CPAP titration 11/12/04- Dr. Humphrey Rolls    PAST SURGICAL HISTORY: Past Surgical History:  Procedure Laterality Date  . CARDIAC CATHETERIZATION  11/03/05  . CARDIOVERSION N/A 09/26/2018   Procedure: CARDIOVERSION;  Surgeon: Geralynn Rile, MD;  Location: [place];  Service: Endoscopy;  Laterality: N/A;  . CATARACT EXTRACTION  2014  . KNEE ARTHROSCOPY     left knee- 2002   . TOTAL HIP ARTHROPLASTY  06/08/2011  Procedure: TOTAL HIP ARTHROPLASTY ANTERIOR APPROACH;  Surgeon: [name], MD;  Location: [facility];  Service: Orthopedics;  Laterality: Left;    SOCIAL HISTORY: Social History   Tobacco Use  . Smoking status: Never Smoker  . Smokeless tobacco: Never Used  Substance Use Topics  . Alcohol use: No  . Drug use: No    FAMILY HISTORY: Family History  Problem Relation Age of Onset  . Hypertension Mother   . Stroke Mother   . Diabetes Mother   . Hyperlipidemia Mother   . Obesity Mother   . Diabetes Sister   . Stroke Maternal Grandmother        70 died   . Heart disease Maternal Grandfather   . Hypertension Maternal Grandfather   . Diabetes Maternal Grandfather   . Arthritis Father   . Heart disease Father   . Heart disease Paternal Grandfather     ROS: Review of Systems  Constitutional: Negative for weight loss.  Gastrointestinal: Negative for nausea and vomiting.  Endo/Heme/Allergies:       Negative for hypoglycemia.    PHYSICAL EXAM: Blood pressure (!) 151/82, pulse 62, temperature 97.8 F (36.6 C), temperature source Oral, height 6\' 3"  (1.905 m), weight 226 lb (102.5 kg), SpO2 98 %. Body mass index is 28.25 kg/m. Physical Exam Vitals signs reviewed.  Constitutional:      Appearance: Normal appearance. He is obese.  Cardiovascular:     Rate and Rhythm: Normal rate.   Pulmonary:     Effort: Pulmonary effort is normal.  Musculoskeletal: Normal range of motion.  Skin:    General: Skin is warm and dry.  Neurological:     Mental Status: He is alert and oriented to person, place, and time.  Psychiatric:        Mood and Affect: Mood normal.        Behavior: Behavior normal.     RECENT LABS AND TESTS: BMET    Component Value Date/Time   NA 140 10/10/2018 1206   K 4.8 10/10/2018 1206   CL 103 10/10/2018 1206   CO2 22 10/10/2018 1206   GLUCOSE 95 10/10/2018 1206   GLUCOSE 111 (H) 10/12/2016 0902   BUN 35 (H) 10/10/2018 1206   CREATININE 1.42 (H) 10/10/2018 1206   CREATININE 1.32 (H) 10/12/2016 0902   CALCIUM 10.3 (H) 10/10/2018 1206   GFRNONAA 49 (L) 10/10/2018 1206   GFRNONAA 54 (L) 10/12/2016 0902   GFRAA 57 (L) 10/10/2018 1206   GFRAA 63 10/12/2016 0902   Lab Results  Component Value Date   HGBA1C 5.8 (H) 09/04/2018   HGBA1C 6.0 (H) 11/01/2017   HGBA1C 5.4 04/14/2016   HGBA1C 6.0 06/09/2015   Lab Results  Component Value Date   INSULIN 9.0 09/04/2018   INSULIN 22.4 11/01/2017   CBC    Component Value Date/Time   WBC 6.1 09/22/2018 1016   WBC 5.4 12/08/2015 0945   RBC 4.88 09/22/2018 1016   RBC 4.87 12/08/2015 0945   HGB 15.4 09/22/2018 1016   HCT 46.2 09/22/2018 1016   PLT 187 09/22/2018 1016   MCV 95 09/22/2018 1016   MCH 31.6 09/22/2018 1016   MCH 31.2 12/08/2015 0945   MCHC 33.3 09/22/2018 1016   MCHC 34.5 12/08/2015 0945   RDW 13.2 09/22/2018 1016   LYMPHSABS 1.5 08/27/2014 1030   MONOABS 0.5 08/27/2014 1030   EOSABS 0.1 08/27/2014 1030   BASOSABS 0.0 08/27/2014 1030   Iron/TIBC/Ferritin/ %Sat No results found for: IRON, TIBC, FERRITIN,  IRONPCTSAT Lipid Panel     Component Value Date/Time   CHOL 117 08/07/2018 0901   TRIG 67 08/07/2018 0901   HDL 50 08/07/2018 0901   CHOLHDL 2.3 08/07/2018 0901   CHOLHDL 3.8 10/12/2016 0902   VLDL 17 04/14/2016 0937   LDLCALC 54 08/07/2018 0901   LDLCALC 119 (H)  10/12/2016 0902   Hepatic Function Panel     Component Value Date/Time   PROT 6.7 09/22/2018 1016   ALBUMIN 4.8 (H) 09/22/2018 1016   AST 27 09/22/2018 1016   ALT 27 09/22/2018 1016   ALKPHOS 52 09/22/2018 1016   BILITOT 1.4 (H) 09/22/2018 1016      Component Value Date/Time   TSH 1.290 08/09/2018 1448   TSH 1.28 12/08/2015 0945   TSH 0.60 06/09/2015 1056   Results for Ugarte, Peregrine L "JERRY" (MRN [id number]) as of 10/30/2018 11:35  Ref. Range 09/04/2018 09:02  Vitamin D, 25-Hydroxy Latest Ref Range: 30.0 - 100.0 ng/mL 43.2    OBESITY BEHAVIORAL INTERVENTION VISIT  Today's visit was # 14   Starting weight: 266 lbs Starting date: 11/01/17 Today's weight : Weight: 226 lb (102.5 kg)  Today's date: 10/30/2018 Total lbs lost to date: 40 At least 15 minutes were spent on discussing the following behavioral intervention visit.    10/30/2018  Height 6\' 3"  (1.905 m)  Weight 226 lb (102.5 kg)  BMI (Calculated) 28.25  BLOOD PRESSURE - SYSTOLIC [id number]  BLOOD PRESSURE - DIASTOLIC 82   Body Fat % 30 %  Total Body Water (lbs) 108 lbs    ASK: We discussed the diagnosis of obesity with [name] today and Kente agreed to give [name] permission to discuss obesity behavioral modification therapy today.  ASSESS: Ladell has the diagnosis of obesity and his BMI today is 28.25. Rickye is in the action stage of change.   ADVISE: Matthewjames was educated on the multiple health risks of obesity as well as the benefit of weight loss to improve his health. He was advised of the need for long term treatment and the importance of lifestyle modifications to improve his current health and to decrease his risk of future health problems.  AGREE: Multiple dietary modification options and treatment options were discussed and Dontay agreed to follow the recommendations documented in the above note.  ARRANGE: Breshawn was educated on the importance of frequent visits to treat obesity as outlined per  CMS and USPSTF guidelines and agreed to schedule his next follow up appointment today.  IMarcille Blanco, CMA, am acting as transcriptionist for Starlyn Skeans, MD  I have reviewed the above documentation for accuracy and completeness, and I agree with the above. -Dennard Nip, MD

## 2018-11-16 ENCOUNTER — Other Ambulatory Visit (INDEPENDENT_AMBULATORY_CARE_PROVIDER_SITE_OTHER): Payer: Self-pay | Admitting: Family Medicine

## 2018-11-16 ENCOUNTER — Other Ambulatory Visit: Payer: Self-pay | Admitting: Cardiovascular Disease

## 2018-11-16 DIAGNOSIS — R7303 Prediabetes: Secondary | ICD-10-CM

## 2018-11-27 ENCOUNTER — Ambulatory Visit (INDEPENDENT_AMBULATORY_CARE_PROVIDER_SITE_OTHER): Payer: Medicare Other | Admitting: Family Medicine

## 2018-12-03 NOTE — Progress Notes (Signed)
[name] [place], [place] [address] Phone: [phone] Subjective:   Clayton [name], am serving as a scribe for Dr. Hulan Saas.   CC: Low back pain  [id number]  Clayton [name] is a 73 y.o. male coming in with complaint of back pain. Last seen on 08/21/2018 for OMT. Patient states that he is here for OMT in order to help decrease pain in his back.   Patient does have some tightness noted.  Discussed icing regimen and home exercise, discussed avoiding certain activities.  Patient activity as tolerated.    Past Medical History:  Diagnosis Date  . Arthritis    hips   . Atrial fibrillation ([place])   . Cataract   . Dysrhythmia    PAF- hx of 7 years ago   . HLD (hyperlipidemia)   . Hypertension 05/29/10   ECHO-EF 67% wnl  . Meniere disease   . Obesity   . Palpitations   . Sleep apnea 09/14/04   [facility]- Dr. Humphrey Rolls; CPAP titration 11/12/04- Dr. Humphrey Rolls   Past Surgical History:  Procedure Laterality Date  . CARDIAC CATHETERIZATION  11/03/05  . CARDIOVERSION N/A 09/26/2018   Procedure: CARDIOVERSION;  Surgeon: Geralynn Rile, MD;  Location: [facility];  Service: Endoscopy;  Laterality: N/A;  . CATARACT EXTRACTION  2014  . KNEE ARTHROSCOPY     left knee- 2002   . TOTAL HIP ARTHROPLASTY  06/08/2011   Procedure: TOTAL HIP ARTHROPLASTY ANTERIOR APPROACH;  Surgeon: [name], MD;  Location: [facility];  Service: Orthopedics;  Laterality: Left;   Social History   Socioeconomic History  . Marital status: Married    Spouse name: Clayton [name]  . Number of children: Not on file  . Years of education: Not on file  . Highest education level: Not on file  Occupational History  . Occupation: Retired  Scientific laboratory technician  . Financial resource strain: Not on file  . Food insecurity    Worry: Not on file    Inability: Not on file  . Transportation needs    Medical: Not on file    Non-medical: Not on file  Tobacco Use   . Smoking status: Never Smoker  . Smokeless tobacco: Never Used  Substance and Sexual Activity  . Alcohol use: [name]  . Drug use: [name]  . Sexual activity: Yes    Birth control/protection: None  Lifestyle  . Physical activity    Days per week: Not on file    Minutes per session: Not on file  . Stress: Not on file  Relationships  . Social Herbalist on phone: Not on file    Gets together: Not on file    Attends religious service: Not on file    Active member of club or organization: Not on file    Attends meetings of clubs or organizations: Not on file    Relationship status: Not on file  Other Topics Concern  . Not on file  Social History Narrative   Married. Clayton [name].Takes care of his mother-in-law as well.    Retired, Conservator, museum/gallery.    Drinks caffeine beverages. Takes a daily vitamin.   Exercises routinely.    Smoke detector in the home.    Ambulates independently.    Allergies  Allergen Reactions  . Statins Other (See Comments)    Crestor - Intestinal issues    Family History  Problem Relation Age of Onset  .  Hypertension Mother   . Stroke Mother   . Diabetes Mother   . Hyperlipidemia Mother   . Obesity Mother   . Diabetes Sister   . Stroke Maternal Grandmother        19 died   . Heart disease Maternal Grandfather   . Hypertension Maternal Grandfather   . Diabetes Maternal Grandfather   . Arthritis Father   . Heart disease Father   . Heart disease Paternal Grandfather     Current Outpatient Medications (Endocrine & Metabolic):  .  metFORMIN (GLUCOPHAGE) 500 MG tablet, Take 1 tablet (500 mg total) by mouth daily with breakfast.  Current Outpatient Medications (Cardiovascular):  .  diltiazem (CARDIZEM CD) 180 MG 24 hr capsule, Take 1 capsule (180 mg total) by mouth daily. Marland Kitchen  ezetimibe (ZETIA) 10 MG tablet, TAKE 1 TABLET BY MOUTH EVERY DAY .  LIVALO 2 MG TABS, TAKE 1 TABLET BY MOUTH EVERY DAY .  metoprolol succinate (TOPROL-XL) 100 MG 24 hr  tablet, Take 100 mg in the evenings (along with 25 mg or 50 mg in the mornings) (Patient taking differently: Take 100 mg by mouth every morning. Take 100 mg in the evenings (along with 50 mg in the mornings)) .  metoprolol succinate (TOPROL-XL) 25 MG 24 hr tablet, TAKE 25 MG IN THE MORNING (ALONG WITH 100 MG IN THE EVENING) FOR 5 DAYS- THEN IF PULSE STILL IN 70'S INCREASE TO 50 MG IN THE MORNING (ALONG WITH 100 MG IN THE EVENING) .  olmesartan (BENICAR) 20 MG tablet, Take 1 tablet (20 mg total) by mouth daily. Marland Kitchen  spironolactone (ALDACTONE) 25 MG tablet, TAKE 1/2 TABLET BY MOUTH EVERY DAY   Current Outpatient Medications (Analgesics):  .  aspirin EC 81 MG tablet, Take 1 tablet (81 mg total) by mouth daily.  Current Outpatient Medications (Hematological):  .  apixaban (ELIQUIS) 5 MG TABS tablet, Take 1 tablet (5 mg total) by mouth 2 (two) times daily.  Current Outpatient Medications (Other):  Marland Kitchen  Ascorbic Acid (VITAMIN C) 1000 MG tablet, Take 1,000 mg by mouth daily.  .  Boswellia-Glucosamine-Vit D (GLUCOSAMINE COMPLEX PO), Take 3 tablets by mouth every morning.  .  Coenzyme Q10 200 MG capsule, Take 200 mg by mouth daily.  .  Multiple Vitamin (MULITIVITAMIN WITH MINERALS) TABS, Take 1 tablet by mouth every evening.  Clayton [name] 3-6-9 Fatty Acids (OMEGA-3-6-9 PO), Take 3 capsules by mouth every morning. Omega 3= 800mg ,omega 6=380mg ,omega 9 =300mg  .  triamcinolone cream (KENALOG) 0.5 %, APPLY 1 APPLICATION TOPICALLY 2 (TWO) TIMES DAILY. TO AFFECTED AREAS. (Patient taking differently: Apply 1 application topically 2 (two) times daily as needed (irritation). To affected areas.) .  zinc gluconate 50 MG tablet, Take 50 mg by mouth every evening.  Marland Kitchen  Zn-Pyg Afri-Nettle-Saw Palmet (SAW PALMETTO COMPLEX PO), Take 1 capsule by mouth every morning.     Past medical history, social, surgical and family history all reviewed in electronic medical record.  [name] pertanent information unless stated regarding to the  chief complaint.   Review of Systems:  [name] headache, visual changes, nausea, vomiting, diarrhea, constipation, dizziness, abdominal pain, skin rash, fevers, chills, night sweats, weight loss, swollen lymph nodes, body aches, joint swelling,  chest pain, shortness of breath, mood changes.  Positive muscle aches  Objective  Blood pressure 120/90, height 6\' 3"  (1.905 m).    General: [name] apparent distress alert and oriented x3 mood and affect normal, dressed appropriately.  HEENT: Pupils equal, extraocular movements intact  Respiratory:  Patient's speak in full sentences and does not appear short of breath  Cardiovascular: [name] lower extremity edema, non tender, [name] erythema  Skin: Warm dry intact with [name] signs of infection or rash on extremities or on axial skeleton.  Abdomen: Soft nontender  Neuro: Cranial nerves II through XII are intact, neurovascularly intact in all extremities with 2+ DTRs and 2+ pulses.  Lymph: [name] lymphadenopathy of posterior or anterior cervical chain or axillae bilaterally.  Gait normal with good balance and coordination.  MSK:  Non tender with full range of motion and good stability and symmetric strength and tone of shoulders, elbows, wrist, hip, knee and ankles bilaterally.  Low back exam does have some mild loss of lordosis.  Still some tightness noted in the paraspinal musculature.  Mild tightness noted in all planes especially with flexion lacking the last 10 degrees.  FABER test [name] show tightness.  Tightness of the hamstrings bilaterally as well but [name] radicular symptoms 5 out of 5 strength of lower extremities  Osteopathic findings T7 extended rotated and side bent left L1 flexed rotated and side bent left Sacrum right on right Pelvic shear left     Impression and Recommendations:     This case required medical decision making of moderate complexity. The above documentation has been reviewed and is accurate and complete [name], DO       Note:  This dictation was prepared with Dragon dictation along with smaller phrase technology. Any transcriptional errors that result from this process are unintentional.

## 2018-12-04 ENCOUNTER — Encounter: Payer: Self-pay | Admitting: Family Medicine

## 2018-12-04 ENCOUNTER — Ambulatory Visit: Payer: Medicare Other | Admitting: Family Medicine

## 2018-12-04 ENCOUNTER — Other Ambulatory Visit: Payer: Self-pay

## 2018-12-04 VITALS — BP 120/90 | Ht 75.0 in

## 2018-12-04 DIAGNOSIS — G8929 Other chronic pain: Secondary | ICD-10-CM

## 2018-12-04 DIAGNOSIS — M999 Biomechanical lesion, unspecified: Secondary | ICD-10-CM

## 2018-12-04 DIAGNOSIS — M9983 Other biomechanical lesions of lumbar region: Secondary | ICD-10-CM

## 2018-12-04 DIAGNOSIS — M545 Low back pain: Secondary | ICD-10-CM | POA: Diagnosis not present

## 2018-12-04 NOTE — Assessment & Plan Note (Signed)
Stable overall.  Patient has done remarkably well with losing weight.  Patient is continuing to do this.  We discussed icing regimen and home exercise, follow-up with me again in 4 to 8 weeks

## 2018-12-04 NOTE — Assessment & Plan Note (Signed)
Decision today to treat with OMT was based on Physical Exam  After verbal consent patient was treated with HVLA, ME, FPR techniques in  thoracic, lumbar and sacral areas  Patient tolerated the procedure well with improvement in symptoms  Patient given exercises, stretches and lifestyle modifications  See medications in patient instructions if given  Patient will follow up in 4-8 weeks 

## 2018-12-12 ENCOUNTER — Other Ambulatory Visit (INDEPENDENT_AMBULATORY_CARE_PROVIDER_SITE_OTHER): Payer: Self-pay | Admitting: Family Medicine

## 2018-12-12 DIAGNOSIS — R7303 Prediabetes: Secondary | ICD-10-CM

## 2018-12-25 ENCOUNTER — Telehealth (INDEPENDENT_AMBULATORY_CARE_PROVIDER_SITE_OTHER): Payer: Self-pay | Admitting: Family Medicine

## 2018-12-25 NOTE — Telephone Encounter (Signed)
Patient called the office stating he is reluctant to come into the office since his family had a scare with Covid and it is increasing in our area.  He was advised we can do a virtual visit but declines at this time, states he is doing well with weight management.  He will call back in January to schedule a virtual visit.   He was also advised that office policy states he needs to have an appointment 6 months from time of his last to prevent starting the program over.  Pt gave verbal understanding of this.  States he is still actively losing weight and just wanted to let us know why he cancelled his last appointment.  Pt asked about his metformin prescription being refilled since he is out but again office policy is to have appointment scheduled and he did not want to do that at this time.

## 2019-01-02 ENCOUNTER — Other Ambulatory Visit: Payer: Self-pay | Admitting: *Deleted

## 2019-01-02 ENCOUNTER — Other Ambulatory Visit: Payer: Self-pay | Admitting: Cardiovascular Disease

## 2019-01-02 DIAGNOSIS — E782 Mixed hyperlipidemia: Secondary | ICD-10-CM

## 2019-01-02 NOTE — Telephone Encounter (Signed)
Rx has been sent to the pharmacy electronically. ° °

## 2019-01-05 ENCOUNTER — Other Ambulatory Visit: Payer: Self-pay

## 2019-01-05 DIAGNOSIS — E782 Mixed hyperlipidemia: Secondary | ICD-10-CM

## 2019-01-05 LAB — COMPREHENSIVE METABOLIC PANEL
ALT: 31 IU/L (ref 0–44)
AST: 26 IU/L (ref 0–40)
Albumin/Globulin Ratio: 2.2 (ref 1.2–2.2)
Albumin: 4.6 g/dL (ref 3.7–4.7)
Alkaline Phosphatase: 65 IU/L (ref 39–117)
BUN/Creatinine Ratio: 19 (ref 10–24)
BUN: 25 mg/dL (ref 8–27)
Bilirubin Total: 0.7 mg/dL (ref 0.0–1.2)
CO2: 22 mmol/L (ref 20–29)
Calcium: 9.7 mg/dL (ref 8.6–10.2)
Chloride: 103 mmol/L (ref 96–106)
Creatinine, Ser: 1.29 mg/dL — ABNORMAL HIGH (ref 0.76–1.27)
GFR calc Af Amer: 64 mL/min/{1.73_m2} (ref >59)
GFR calc non Af Amer: 55 mL/min/{1.73_m2} — ABNORMAL LOW (ref >59)
Globulin, Total: 2.1 g/dL (ref 1.5–4.5)
Glucose: 99 mg/dL (ref 65–99)
Potassium: 4.6 mmol/L (ref 3.5–5.2)
Sodium: 137 mmol/L (ref 134–144)
Total Protein: 6.7 g/dL (ref 6.0–8.5)

## 2019-01-05 LAB — LIPID PANEL
Chol/HDL Ratio: 2 ratio (ref 0.0–5.0)
Cholesterol, Total: 114 mg/dL (ref 100–199)
HDL: 57 mg/dL (ref >39)
LDL Chol Calc (NIH): 47 mg/dL (ref 0–99)
Triglycerides: 39 mg/dL (ref 0–149)
VLDL Cholesterol Cal: 10 mg/dL (ref 5–40)

## 2019-01-09 ENCOUNTER — Ambulatory Visit: Payer: Medicare Other | Admitting: Cardiovascular Disease

## 2019-01-09 ENCOUNTER — Other Ambulatory Visit: Payer: Self-pay

## 2019-01-09 ENCOUNTER — Encounter: Payer: Self-pay | Admitting: Cardiovascular Disease

## 2019-01-09 VITALS — BP 151/108 | HR 97 | Ht 75.0 in | Wt 236.2 lb

## 2019-01-09 DIAGNOSIS — I4819 Other persistent atrial fibrillation: Secondary | ICD-10-CM

## 2019-01-09 DIAGNOSIS — I10 Essential (primary) hypertension: Secondary | ICD-10-CM

## 2019-01-09 DIAGNOSIS — G4733 Obstructive sleep apnea (adult) (pediatric): Secondary | ICD-10-CM

## 2019-01-09 DIAGNOSIS — Z9989 Dependence on other enabling machines and devices: Secondary | ICD-10-CM

## 2019-01-09 DIAGNOSIS — Z7901 Long term (current) use of anticoagulants: Secondary | ICD-10-CM | POA: Diagnosis not present

## 2019-01-09 DIAGNOSIS — E782 Mixed hyperlipidemia: Secondary | ICD-10-CM

## 2019-01-09 MED ORDER — FLECAINIDE ACETATE 50 MG PO TABS: 50.0000 mg | ORAL_TABLET | Freq: Two times a day (BID) | ORAL | 3 refills | Status: DC

## 2019-01-09 NOTE — Patient Instructions (Signed)
Medication Instructions:  Your physician has recommended you make the following change in your medication:   START FLECAINIDE (TAMBOCOR) 50 MG. ONE TABLET BY MOUTH TWICE A DAY  *If you need a refill on your cardiac medications before your next appointment, please call your pharmacy*  Lab Work: Your physician recommends that you return for lab work today:   [place]  If you have labs (blood work) drawn today and your tests are completely normal, you will receive your results only by: Marland Kitchen MyChart Message (if you have MyChart) OR . A paper copy in the mail If you have any lab test that is abnormal or we need to change your treatment, we will call you to review the results.  Testing/Procedures: NONE  Follow-Up: At [facility], you and your health needs are our priority.  As part of our continuing mission to provide you with exceptional heart care, we have created designated Provider Care Teams.  These Care Teams include your primary Cardiologist (physician) and Advanced Practice Providers (APPs -  Physician Assistants and Nurse Practitioners) who all work together to provide you with the care you need, when you need it.  Your next appointment:   01/25/2019  The format for your next appointment:   In Person  Provider:   Shelva Majestic, MD

## 2019-01-09 NOTE — Progress Notes (Signed)
Patient ID: Clayton [name], male   DOB: March 22, 1945, 73 y.o.   MRN: [id number]     HPI: Clayton [name] is a 73 y.o. male who presents for a 3 month follow-up cardiology and sleep evaluation.  Clayton [name] has a history of hypertension, a remote history of PAF, status post cardioversion in 2007, obstructive sleep apnea for which he uses CPAP 100% of the time, hypertension, and hyperlipidemia. He has had weight fluctuations over the years.  In the past, he also had a history of Mnire's disease.  There was some concern by Dr. Ronette [name] that perhaps this may have been related to statin use, which led to its discontinuance.  He is not had Mnire's disease in some time.  He underwent left hip replacement surgery by Dr. Alvan [name] and now has been able to continue to be active and exercises routinely.   His  blood pressure regimen has been amlodipine/benazepril 10/40 daily, hydrochlorothiazide has been taking 25 Mill grams in the evening and Toprol-XL 100 mg daily. He had atrial of livalo  For hyperlipidemia but developed some diarrhea secondary to this.    He has not had much success with weight loss over the past several years.    He continues to use his CPAP therapy with 100% compliance.  He will not even take a nap without his CPAP therapy and he takes his CPAP unit on alll his travels.   Since initiating CPAP therapy, he is unaware of any palpitations.  He denies any recurrent atrial fibrillation.  In the past he has had difficulty with family stress and his father had passed away, and his wife's mother recently died after having developed lung CA and had significant PVD.  He was not exercising as much as he had in the past.  He has not been successful in weight loss.  He continues to be active and still walks 3-4 miles per day.  Oftentimes in the morning when he awakens his blood pressure is elevated at 150/90 but after exercise it drops to 128/80.  He is unaware of any recurrent atrial fibrillation.  He  continues to use CPAP with 100% compliance.   He completed a two-year grieving process.  On 01/19/2016 he committed to weight loss.  On January 1, he weighed 278 pounds .  He has been walking 4 miles 5 days per week as well as some intermittent stationary bike.  He has lost a total of 32 pounds since January.  He continues to use CPAP with 100% compliance and since he has been on CPAP, he has not had any recurrent atrial fibrillation and his blood pressure has been less labile.  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  He has had failures to Crestor and Lipitor in the past due to significant myalgias, development, even with weekly dosing.  I resumed zetia   In August 2018 he noticed his heart rate increasing and he presented for evaluation and was seen by Clayton [name], PAC.  He admitted to a rare palpitation.  He denied any chest pain with exertion or dyspnea on exertion.  He has suffered a broken rib.  This year.  He has a weight goal of 220 pounds.    I  saw him in September 2018, at which time he was doing well from a cardiovascular standpoint.   His father was ill and ultimately passed away in [place].  As result he was going back and forth to the [facility] area.  This resulted in a change in his diet and ability to exercise.  I last saw him in October 2019 and his weight had increased from 240 back up to 270 pounds.  He was unaware of any episodes of atrial fibrillation.  He was using CPAP with 100% compliance along with his naps.  He was committed to begin weight loss again.    Since I saw him on October 19, 2018 he has had major lifestyle change.  He has been working with Dr. Leafy [name] in the healthy weight and wellness group.  Peak weight was 280 and most recent weight 244.  I saw him for a cardiology evaluation on August 09, 2018.  O ver the past 4 months, he had been under increased stress as result of his wife's illness.  He has been checking his blood pressure regularly and this has  been stable but on his blood pressure recording he has noticed the message of heart rate irregularity.  He is unaware of A. fib but has noticed this message for the last 3 to 4 months.  He is exercising every day and typically walks 2 miles in the morning and in the afternoon or evening does 30 minutes of stationary bike at least 5 to 6 days/week.  He continues to use CPAP.  I obtained a download in the office today from June 22 through August 08, 2018.  He is 100% compliant.  He has a ResMed air sense 10 auto unit with a pressure range of 8-20 with 95% pressure at 10.1 and maximum average and 11.5.  AHI is excellent at 1.2.     During his August 09, 2018 encounter his ECG demonstrated that he was back in atrial fibrillation and had a ventricular rate at 89 bpm.  There was LVH with repolarization changes.  At that time, I recommended discontinuance of amlodipine/benazepril combination and in its place started Cardizem CD 180 mg rather than amlodipine and olmesartan 20 mg rather than benazepril.  I initiated anticoagulation with Eliquis 5 mg twice a day.  He underwent a 2D echo Doppler study on August 16, 2018 which showed EF low normal at 50 to 55%.  There was evidence for significant LVH and moderate dilation of his left atrium.  I saw him for follow-up evaluation on August 23, 2018.  At that time he was now cognizant of his heart rate irregularity.  He has continued to use CPAP with excellent compliance.  A new download was obtained from July 5 through August 21, 2018 which shows 100% compliance with average usage 7 hours and 28 minutes.  AHI is 1.6 and his 95th percentile auto pressure is 10.1 with a maximum average pressure of 11.4.  He states he has lost weight over the past several weeks purposefully.  He continues to exercise now on a stationary bike.  He denies any chest pain PND orthopnea.  He was tolerating Eliquis without bleeding.  During that evaluation, we discussed different options including several  additional weeks of increased weight control versus initiating antiarrhythmic therapy.  Since his only other episode of atrial fibrillation previously occurred 13 years ago and he had not had any recurrence until this year he opted for an increased rate control trial.  As result metoprolol dose was further titrated.  I saw on September 15, 2018 for follow-up evaluation.  At that time remained in atrial fibrillation despite his increase metoprolol succinate to 100 mg in the morning and 50 mg at night with continuation of  diltiazem 180 mg, spironolactone 12.5 mg daily in addition to his olmesartan 20 mg.  I scheduled him for DC cardioversion but because of the need to obtain a COVID test I was unable to schedule this to be done by me prior to going on vacation.  He ultimately underwent the successful cardioversion by Dr. Davina Poke successfully and received 1 shock of 200 J of biphasic synchronized rhythm with restoration of sinus rhythm.  Chemistry profile done prior to the cardioversion revealed his creatinine had increased to 1.69 and as result I recommended he reduce his olmesartan down to 10 mg from his dose of 20 mg.  I last saw him on October 10, 2018 in follow-up of his cardioversion.  At that time he was maintaining sinus rhythm and feeling well.  He had more energy.  His stress level had significantly reduced since his wife had successful L5-S1 back surgery by Dr. Vertell Limber.  He was continuing to use CPAP with 100% compliance.    Over the past 3 months, he has continued to feel well.  He remains asymptomatic.  He is unaware of any heart rate irregularity but has not been taking his blood pressure or using his Apple Watch.  At times there are still periods of increased stress.  He continues to use CPAP with 100% compliance.  A download was obtained from November 22 through January 08, 2019 which showed 100% compliance.  Average usage was 6 hours 47 minutes.  AHI is excellent at 1.4 with his AutoSet CPAP  minimum pressure set at 8 and maximum of 20, with 95th percentile pressure 10.2 with maximum average pressure 12.0.  He presents for evaluation.   Past Medical History:  Diagnosis Date   Arthritis    hips    Atrial fibrillation (HCC)    Cataract    Dysrhythmia    PAF- hx of 7 years ago    HLD (hyperlipidemia)    Hypertension 05/29/10   ECHO-EF 67% wnl   Meniere disease    Obesity    Palpitations    Sleep apnea 09/14/04   [facility]- Dr. Humphrey Rolls; CPAP titration 11/12/04- Dr. Humphrey Rolls    Past Surgical History:  Procedure Laterality Date   CARDIAC CATHETERIZATION  11/03/05   CARDIOVERSION N/A 09/26/2018   Procedure: CARDIOVERSION;  Surgeon: Geralynn Rile, MD;  Location: [facility];  Service: Endoscopy;  Laterality: N/A;   CATARACT EXTRACTION  2014   KNEE ARTHROSCOPY     left knee- 2002    TOTAL HIP ARTHROPLASTY  06/08/2011   Procedure: TOTAL HIP ARTHROPLASTY ANTERIOR APPROACH;  Surgeon: [name], MD;  Location: [facility];  Service: Orthopedics;  Laterality: Left;    Allergies  Allergen Reactions   Statins Other (See Comments)    Crestor - Intestinal issues     Current Outpatient Medications  Medication Sig Dispense Refill   Ascorbic Acid (VITAMIN C) 1000 MG tablet Take 1,000 mg by mouth daily.      aspirin EC 81 MG tablet Take 1 tablet (81 mg total) by mouth daily. 90 tablet 3   Boswellia-Glucosamine-Vit D (GLUCOSAMINE COMPLEX PO) Take 3 tablets by mouth every morning.      Coenzyme Q10 200 MG capsule Take 200 mg by mouth daily.      diltiazem (CARDIZEM CD) 180 MG 24 hr capsule Take 1 capsule (180 mg total) by mouth daily. 90 capsule 3   ELIQUIS 5 MG TABS tablet TAKE 1 TABLET BY MOUTH TWICE A DAY 180 tablet  2   ezetimibe (ZETIA) 10 MG tablet TAKE 1 TABLET BY MOUTH EVERY DAY 90 tablet 3   flecainide (TAMBOCOR) 50 MG tablet Take 1 tablet (50 mg total) by mouth 2 (two) times daily. 180 tablet 3   LIVALO 2 MG TABS TAKE 1 TABLET BY  MOUTH EVERY DAY 90 tablet 3   metFORMIN (GLUCOPHAGE) 500 MG tablet Take 1 tablet (500 mg total) by mouth daily with breakfast. 30 tablet 0   metoprolol succinate (TOPROL-XL) 100 MG 24 hr tablet Take 100 mg in the evenings (along with 25 mg or 50 mg in the mornings) (Patient taking differently: Take 100 mg by mouth every morning. Take 100 mg in the evenings (along with 50 mg in the mornings)) 90 tablet 3   metoprolol succinate (TOPROL-XL) 25 MG 24 hr tablet TAKE 25 MG IN THE MORNING (ALONG WITH 100 MG IN THE EVENING) FOR 5 DAYS- THEN IF PULSE STILL IN 70'S INCREASE TO 50 MG IN THE MORNING (ALONG WITH 100 MG IN THE EVENING) 180 tablet 1   Multiple Vitamin (MULITIVITAMIN WITH MINERALS) TABS Take 1 tablet by mouth every evening.      olmesartan (BENICAR) 20 MG tablet TAKE 1 TABLET BY MOUTH EVERY DAY 90 tablet 2   Omega 3-6-9 Fatty Acids (OMEGA-3-6-9 PO) Take 3 capsules by mouth every morning. Omega 3= 836m,omega 6=3860momega 9 =30061m   spironolactone (ALDACTONE) 25 MG tablet TAKE 1/2 TABLET BY MOUTH EVERY DAY 45 tablet 3   triamcinolone cream (KENALOG) 0.5 % APPLY 1 APPLICATION TOPICALLY 2 (TWO) TIMES DAILY. TO AFFECTED AREAS. (Patient taking differently: Apply 1 application topically 2 (two) times daily as needed (irritation). To affected areas.) 30 g 3   zinc gluconate 50 MG tablet Take 50 mg by mouth every evening.      Zn-Pyg Afri-Nettle-Saw Palmet (SAW PALMETTO COMPLEX PO) Take 1 capsule by mouth every morning.      No current facility-administered medications for this visit.    Socially he is remarried after his first wife passed away. He has 2 children. 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 is no tobacco history.  He plays golf at least 2 days/week and this past year not only did he have a hole in 1 but he has also shot his age.  ROS General: Negative; No fevers, chills, or night sweats;  HEENT: Negative; No changes in vision or hearing, sinus  congestion, difficulty swallowing Pulmonary: Negative; No cough, wheezing, shortness of breath, hemoptysis Cardiovascular: See HPI GI: Negative; No nausea, vomiting, diarrhea, or abdominal pain GU: Negative; No dysuria, hematuria, or difficulty voiding Musculoskeletal: He is status post rib fracture.  He is status post hip replacement. Hematologic/Oncology: Negative; no easy bruising, bleeding Endocrine: Negative; no heat/cold intolerance; no diabetes Neuro: No vertigo or imbalance issues Skin: Negative; No rashes or skin lesions Psychiatric: Negative; No behavioral problems, depression Sleep: Positive for obstructive sleep apnea on CPAP therapy with 100% compliance No snoring, daytime sleepiness, hypersomnolence, bruxism, restless legs, hypnogognic hallucinations, no cataplexy Other comprehensive 14 point system review is negative.  PE BP (!) 151/108    Pulse 97    Ht _0  (1.905 m)    Wt 236 lb 3.2 oz (107.1 kg)    SpO2 98%    BMI 29.52 kg/m    Repeat blood pressure by me was 142/90.  Wt Readings from Last 3 Encounters:  01/09/19 236 lb 3.2 oz (107.1 kg)  10/30/18 226 lb (102.5 kg)  10/10/18  229 lb (103.9 kg)  He states that he has lost 40 pounds over the past year.  General: Alert, oriented, no distress.  Skin: normal turgor, no rashes, warm and dry HEENT: Normocephalic, atraumatic. Pupils equal round and reactive to light; sclera anicteric; extraocular muscles intact; Nose without nasal septal hypertrophy Mouth/Parynx benign; Mallinpatti scale 3 Neck: No JVD, no carotid bruits; normal carotid upstroke Lungs: clear to ausculatation and percussion; no wheezing or rales Chest wall: without tenderness to palpitation Heart: PMI not displaced, irregularly irregular in the 90s, s1 s2 normal, 1/6 systolic murmur, no diastolic murmur, no rubs, gallops, thrills, or heaves Abdomen: soft, nontender; no hepatosplenomehaly, BS+; abdominal aorta nontender and not dilated by palpation. Back:  no CVA tenderness Pulses 2+ Musculoskeletal: full range of motion, normal strength, no joint deformities Extremities: no clubbing cyanosis or edema, Homan's sign negative  Neurologic: grossly nonfocal; Cranial nerves grossly wnl Psychologic: Normal mood and affect   ECG (independently read by me): Atrial fibrillation ar 97, RBBB; Left axis deviation; Inferior Q waves, unchanged. QTc 487    August 09, 2018 ECG (independently read by me): Normal sinus rhythm at 72 bpm.  Left Axis deviation.  QTc interval 429 ms.  September 15, 2018 ECG (independently read by me): Atrial fibrillation at 92 bpm.  Right bundle branch block with repolarization changes.  August 23, 2018 ECG (independently read by me): Atrial fibrillation at 96 bpm.  LVH with repolarization changes.  Left axis deviation.  Left anterior hemiblock.  Inferior Q waves  August 09, 2018 ECG (independently read by me):Atrial fibrillatoion at 79; LVH with repolarization  October 219 ECG (independently read by me): Normal sinus rhythm at 81 bpm.  Nonspecific intraventricular conduction delay.  Left axis deviation  September 2018 ECG (independently read by me): Normal sinus rhythm at 63 bpm.  Left bundle branch block with repolarization changes.  Normal intervals.  No ectopy.  April 2018 ECG (independently read by me): Normal sinus rhythm at 68 bpm.  LVH with repolarization.  Normal intervals.  November 2017 ECG (independently read by me): Normal sinus rhythm at 78 bpm.  Nonspecific interventricular delay.  QRS complex V1 V2.  August 2016 ECG (independently read by me): Normal sinus rhythm at 77 bpm.  LVH with repolarization changes.  Left axis deviation.  December 2015 ECG (independently read by me): Normal sinus rhythm at 87 bpm.  LVH by voltage criteria with T-wave changes in leads 1 and aVL.  July 2014 ECG: Normal sinus rhythm at 76 beats per minute. LVH with QRS widening.  LABS:  BMP Latest Ref Rng & Units 01/05/2019 10/10/2018 09/22/2018    Glucose 65 - 99 mg/dL 99 95 98  BUN 8 - 27 mg/dL 25 35(H) 30(H)  Creatinine 0.76 - 1.27 mg/dL 1.29(H) 1.42(H) 1.69(H)  BUN/Creat Ratio 10 - 24 19 25(H) 18  Sodium 134 - 144 mmol/L 137 140 141  Potassium 3.5 - 5.2 mmol/L 4.6 4.8 4.5  Chloride 96 - 106 mmol/L 103 103 102  CO2 20 - 29 mmol/L _0 Calcium 8.6 - 10.2 mg/dL 9.7 10.3(H) 10.3(H)   Hepatic Function Latest Ref Rng & Units 01/05/2019 09/22/2018 09/04/2018  Total Protein 6.0 - 8.5 g/dL 6.7 6.7 6.7  Albumin 3.7 - 4.7 g/dL 4.6 4.8(H) 4.5  AST 0 - 40 IU/L _1 ALT 0 - 44 IU/L _2 Alk Phosphatase 39 - 117 IU/L 65 52 50  Total Bilirubin 0.0 - 1.2 mg/dL 0.7 1.4(H) 1.0  CBC Latest Ref Rng & Units 09/22/2018 08/09/2018 12/08/2015  WBC 3.4 - 10.8 x10E3/uL 6.1 6.6 5.4  Hemoglobin 13.0 - 17.7 g/dL 15.4 15.7 15.2  Hematocrit 37.5 - 51.0 % 46.2 47.5 44.1  Platelets 150 - 450 x10E3/uL 187 208 194   Lab Results  Component Value Date   MCV 95 09/22/2018   MCV 92 08/09/2018   MCV 90.6 12/08/2015   Lab Results  Component Value Date   TSH 1.290 08/09/2018    Lab Results  Component Value Date   HGBA1C 5.8 (H) 09/04/2018    Lipid Panel     Component Value Date/Time   CHOL 114 01/05/2019 1003   TRIG 39 01/05/2019 1003   HDL 57 01/05/2019 1003   CHOLHDL 2.0 01/05/2019 1003   CHOLHDL 3.8 10/12/2016 0902   VLDL 17 04/14/2016 0937   LDLCALC 47 01/05/2019 1003   LDLCALC 119 (H) 10/12/2016 0902     RADIOLOGY: No results found.  IMPRESSION:  1. Persistent atrial fibrillation ([place])   2. Essential hypertension   3. OSA on CPAP   4. Anticoagulated   5. Mixed hyperlipidemia      ASSESSMENT AND PLAN: Mr Tubby is a 73 year old gentleman who has a long-standing history of hypertension and had been on amlodipine/benazepril 10/40, HCTZ 25 mg, Toprol-XL 100 mg in addition to spironolactone 12.5 mg daily.  On this regimen his blood pressure had stabilized and consistently was well controlled.   He had gained significant  amount of weight since August 2019.  He initiated a major lifestyle change and has been working closely with Dr. Leafy [name] from healthy weight and wellness group resulting in a greater than 40 pound weight loss over the past year.  When I saw him in late July 2020 he was in atrial fibrillation of questionable duration.  At the time retrospectively he believes he may have been in A. fib for 3 to 4 months duration.  I gradually titrated his medical regimen and changed his benazepril to diltiazem and olmesartan to take in addition to metoprolol succinate and his spironolactone 12.5 mg daily.  He had continue to use CPAP with 100% compliance.  His only previous episode of A. fib prior to this episode had occurred in 2007 and he had maintained sinus rhythm until this past year.  He underwent successful cardioversion on September 26, 2018 with restoration of sinus rhythm.  Presently, he remains asymptomatic.  However ECG today again confirms recurrent atrial fibrillation for which he was completely unaware.  Ventricular rate is in the 90s despite taking diltiazem 180 mg daily, metoprolol 25 mg in the morning and 100 mg in the evening, olmesartan 20 mg daily, and spironolactone 12.5 mg.  Blood pressure today was elevated.  He has previously been documented to have an EF of 50 to 55% without wall motion abnormalities on echocardiography.  He does not have any history of CAD.  He has continued to use CPAP with 100% compliance with current AHI 1.4/h with a 95 percentile pressure at 10.2 cm of water..  I discussed options with him including initiation of antiarrhythmic therapy and possible referral for EP evaluation for consideration of ablation.  Presently I will initiate flecainide at low-dose 50 mg twice a day.  I will see him in the office in 2 weeks which time a follow-up ECG will be obtained.  He continues to be on Eliquis for anticoagulation.  He continues to be on Zetia for hyperlipidemia.  Lipid studies in July 2020  showed total cholesterol 117, HDL 50, LDL 54, triglycerides 67.  I am checking a chemistry profile and magnesium level today.  TSH in July 2020 was normal at 1.29.  I will see him in 2 weeks for follow-up evaluation and further recommendations were made at that time.  Time spent: 25 minutes night R as well thanks again for your help [name], MD, [facility]  01/09/2019 6:40 PM

## 2019-01-10 LAB — COMPREHENSIVE METABOLIC PANEL
ALT: 31 IU/L (ref 0–44)
AST: 30 IU/L (ref 0–40)
Albumin/Globulin Ratio: 2.1 (ref 1.2–2.2)
Albumin: 4.5 g/dL (ref 3.7–4.7)
Alkaline Phosphatase: 64 IU/L (ref 39–117)
BUN/Creatinine Ratio: 23 (ref 10–24)
BUN: 30 mg/dL — ABNORMAL HIGH (ref 8–27)
Bilirubin Total: 0.8 mg/dL (ref 0.0–1.2)
CO2: 21 mmol/L (ref 20–29)
Calcium: 10 mg/dL (ref 8.6–10.2)
Chloride: 106 mmol/L (ref 96–106)
Creatinine, Ser: 1.31 mg/dL — ABNORMAL HIGH (ref 0.76–1.27)
GFR calc Af Amer: 62 mL/min/{1.73_m2} (ref >59)
GFR calc non Af Amer: 54 mL/min/{1.73_m2} — ABNORMAL LOW (ref >59)
Globulin, Total: 2.1 g/dL (ref 1.5–4.5)
Glucose: 96 mg/dL (ref 65–99)
Potassium: 4.8 mmol/L (ref 3.5–5.2)
Sodium: 142 mmol/L (ref 134–144)
Total Protein: 6.6 g/dL (ref 6.0–8.5)

## 2019-01-10 LAB — MAGNESIUM: Magnesium: 2.3 mg/dL (ref 1.6–2.3)

## 2019-01-14 NOTE — Assessment & Plan Note (Deleted)
Multifactorial, patient now has been losing weight and doing significantly better.  Believe the patient will do really well.  Discussed icing regimen, home exercise, which activities to avoid.  Follow-up again in 4 to 8 weeks.

## 2019-01-14 NOTE — Progress Notes (Deleted)
[name] [address], [place] [address] Phone: [phone] Subjective:     CC: Low back pain follow-up  [id number]  Clayton [name] is a 73 y.o. male coming in with complaint of ***  Onset-  Location Duration-  Character- Aggravating factors- Reliving factors-  Therapies tried-  Severity-     Past Medical History:  Diagnosis Date  . Arthritis    hips   . Atrial fibrillation ([place])   . Cataract   . Dysrhythmia    PAF- hx of 7 years ago   . HLD (hyperlipidemia)   . Hypertension 05/29/10   ECHO-EF 67% wnl  . Meniere disease   . Obesity   . Palpitations   . Sleep apnea 09/14/04   [facility]- Dr. Humphrey Rolls; CPAP titration 11/12/04- Dr. Humphrey Rolls   Past Surgical History:  Procedure Laterality Date  . CARDIAC CATHETERIZATION  11/03/05  . CARDIOVERSION N/A 09/26/2018   Procedure: CARDIOVERSION;  Surgeon: Geralynn Rile, MD;  Location: [facility];  Service: Endoscopy;  Laterality: N/A;  . CATARACT EXTRACTION  2014  . KNEE ARTHROSCOPY     left knee- 2002   . TOTAL HIP ARTHROPLASTY  06/08/2011   Procedure: TOTAL HIP ARTHROPLASTY ANTERIOR APPROACH;  Surgeon: [name], MD;  Location: [facility];  Service: Orthopedics;  Laterality: Left;   Social History   Socioeconomic History  . Marital status: Married    Spouse name: Clayton [name]  . Number of children: Not on file  . Years of education: Not on file  . Highest education level: Not on file  Occupational History  . Occupation: Retired  Tobacco Use  . Smoking status: Never Smoker  . Smokeless tobacco: Never Used  Substance and Sexual Activity  . Alcohol use: No  . Drug use: No  . Sexual activity: Yes    Birth control/protection: None  Other Topics Concern  . Not on file  Social History Narrative   Married. [name].Takes care of his mother-in-law as well.    Retired, Conservator, museum/gallery.    Drinks caffeine beverages. Takes a daily vitamin.   Exercises routinely.    Smoke detector in the home.    Ambulates independently.    Social Determinants of Health   Financial Resource Strain:   . Difficulty of Paying Living Expenses: Not on file  Food Insecurity:   . Worried About Charity fundraiser in the Last Year: Not on file  . Ran Out of Food in the Last Year: Not on file  Transportation Needs:   . Lack of Transportation (Medical): Not on file  . Lack of Transportation (Non-Medical): Not on file  Physical Activity:   . Days of Exercise per Week: Not on file  . Minutes of Exercise per Session: Not on file  Stress:   . Feeling of Stress : Not on file  Social Connections:   . Frequency of Communication with Friends and Family: Not on file  . Frequency of Social Gatherings with Friends and Family: Not on file  . Attends Religious Services: Not on file  . Active Member of Clubs or Organizations: Not on file  . Attends Archivist Meetings: Not on file  . Marital Status: Not on file   Allergies  Allergen Reactions  . Statins Other (See Comments)    Crestor - Intestinal issues    Family History  Problem Relation Age of Onset  . Hypertension Mother   . Stroke Mother   .  Diabetes Mother   . Hyperlipidemia Mother   . Obesity Mother   . Diabetes Sister   . Stroke Maternal Grandmother        30 died   . Heart disease Maternal Grandfather   . Hypertension Maternal Grandfather   . Diabetes Maternal Grandfather   . Arthritis Father   . Heart disease Father   . Heart disease Paternal Grandfather     Current Outpatient Medications (Endocrine & Metabolic):  .  metFORMIN (GLUCOPHAGE) 500 MG tablet, Take 1 tablet (500 mg total) by mouth daily with breakfast.  Current Outpatient Medications (Cardiovascular):  .  diltiazem (CARDIZEM CD) 180 MG 24 hr capsule, Take 1 capsule (180 mg total) by mouth daily. Marland Kitchen  ezetimibe (ZETIA) 10 MG tablet, TAKE 1 TABLET BY MOUTH EVERY DAY .  flecainide (TAMBOCOR) 50 MG tablet, Take 1  tablet (50 mg total) by mouth 2 (two) times daily. Marland Kitchen  LIVALO 2 MG TABS, TAKE 1 TABLET BY MOUTH EVERY DAY .  metoprolol succinate (TOPROL-XL) 100 MG 24 hr tablet, Take 100 mg in the evenings (along with 25 mg or 50 mg in the mornings) (Patient taking differently: Take 100 mg by mouth every morning. Take 100 mg in the evenings (along with 50 mg in the mornings)) .  metoprolol succinate (TOPROL-XL) 25 MG 24 hr tablet, TAKE 25 MG IN THE MORNING (ALONG WITH 100 MG IN THE EVENING) FOR 5 DAYS- THEN IF PULSE STILL IN 70'S INCREASE TO 50 MG IN THE MORNING (ALONG WITH 100 MG IN THE EVENING) .  olmesartan (BENICAR) 20 MG tablet, TAKE 1 TABLET BY MOUTH EVERY DAY .  spironolactone (ALDACTONE) 25 MG tablet, TAKE 1/2 TABLET BY MOUTH EVERY DAY   Current Outpatient Medications (Analgesics):  .  aspirin EC 81 MG tablet, Take 1 tablet (81 mg total) by mouth daily.  Current Outpatient Medications (Hematological):  Marland Kitchen  ELIQUIS 5 MG TABS tablet, TAKE 1 TABLET BY MOUTH TWICE A DAY  Current Outpatient Medications (Other):  Marland Kitchen  Ascorbic Acid (VITAMIN C) 1000 MG tablet, Take 1,000 mg by mouth daily.  .  Boswellia-Glucosamine-Vit D (GLUCOSAMINE COMPLEX PO), Take 3 tablets by mouth every morning.  .  Coenzyme Q10 200 MG capsule, Take 200 mg by mouth daily.  .  Multiple Vitamin (MULITIVITAMIN WITH MINERALS) TABS, Take 1 tablet by mouth every evening.  [name] 3-6-9 Fatty Acids (OMEGA-3-6-9 PO), Take 3 capsules by mouth every morning. Omega 3= 800mg ,omega 6=380mg ,omega 9 =300mg  .  triamcinolone cream (KENALOG) 0.5 %, APPLY 1 APPLICATION TOPICALLY 2 (TWO) TIMES DAILY. TO AFFECTED AREAS. (Patient taking differently: Apply 1 application topically 2 (two) times daily as needed (irritation). To affected areas.) .  zinc gluconate 50 MG tablet, Take 50 mg by mouth every evening.  Marland Kitchen  Zn-Pyg Afri-Nettle-Saw Palmet (SAW PALMETTO COMPLEX PO), Take 1 capsule by mouth every morning.     Past medical history, social, surgical and  family history all reviewed in electronic medical record.  No pertanent information unless stated regarding to the chief complaint.   Review of Systems:  No headache, visual changes, nausea, vomiting, diarrhea, constipation, dizziness, abdominal pain, skin rash, fevers, chills, night sweats, weight loss, swollen lymph nodes, body aches, joint swelling, muscle aches, chest pain, shortness of breath, mood changes.   Objective  There were no vitals taken for this visit. Systems examined below as of    General: No apparent distress alert and oriented x3 mood and affect normal, dressed appropriately.  HEENT: Pupils equal,  extraocular movements intact  Respiratory: Patient's speak in full sentences and does not appear short of breath  Cardiovascular: No lower extremity edema, non tender, no erythema  Skin: Warm dry intact with no signs of infection or rash on extremities or on axial skeleton.  Abdomen: Soft nontender  Neuro: Cranial nerves II through XII are intact, neurovascularly intact in all extremities with 2+ DTRs and 2+ pulses.  Lymph: No lymphadenopathy of posterior or anterior cervical chain or axillae bilaterally.  Gait normal with good balance and coordination.  MSK:  Non tender with full range of motion and good stability and symmetric strength and tone of shoulders, elbows, wrist, hip, knee and ankles bilaterally.  Back Exam:  Inspection: Unremarkable  Motion: Flexion 45 deg, Extension 45 deg, Side Bending to 45 deg bilaterally,  Rotation to 45 deg bilaterally  SLR laying: Negative  XSLR laying: Negative  Palpable tenderness: None. FABER: negative. Sensory change: Gross sensation intact to all lumbar and sacral dermatomes.  Reflexes: 2+ at both patellar tendons, 2+ at achilles tendons, Babinski's downgoing.  Strength at foot  Plantar-flexion: 5/5 Dorsi-flexion: 5/5 Eversion: 5/5 Inversion: 5/5  Leg strength  Quad: 5/5 Hamstring: 5/5 Hip flexor: 5/5 Hip abductors: 5/5  Gait  unremarkable.  Osteopathic findings  T5 extended rotated and side bent right T9 extended rotated and side bent left L2 flexed rotated and side bent right Sacrum right on right    Impression and Recommendations:     This case required medical decision making of moderate complexity. The above documentation has been reviewed and is accurate and complete [name], DO       Note: This dictation was prepared with Dragon dictation along with smaller phrase technology. Any transcriptional errors that result from this process are unintentional.

## 2019-01-14 NOTE — Assessment & Plan Note (Deleted)
Decision today to treat with OMT was based on Physical Exam  After verbal consent patient was treated with HVLA, ME, FPR techniques in  thoracic, lumbar and sacral areas  Patient tolerated the procedure well with improvement in symptoms  Patient given exercises, stretches and lifestyle modifications  See medications in patient instructions if given  Patient will follow up in 4-8 weeks 

## 2019-01-15 ENCOUNTER — Ambulatory Visit: Payer: Medicare Other | Admitting: Family Medicine

## 2019-01-22 ENCOUNTER — Telehealth: Payer: Self-pay | Admitting: Cardiovascular Disease

## 2019-01-22 NOTE — Telephone Encounter (Signed)
I spoke with Clayton [name].  He was recently started on flecainide. Has been having facial flushing for a week. He is aware he is in afib Yesterday evening around dinner he had episode of being disoriented.  No physical symptoms. No facial droop, numbness, slurring of words. No trouble speaking. Just was unable to focus. Lasted about 20-30 minutes. No further episodes.  He usually exercises daily but has held off on this today due to episode last night. Is taking Eliquis as prescribed. He is seeing Dr Claiborne Billings on 1/7.  He would like to know Dr Evette Georges thoughts on episode last night and if any changes need to be made prior to appointment on 1/7 or if sooner appointment is needed.

## 2019-01-22 NOTE — Telephone Encounter (Signed)
New Message:     Pt would like for you to call him. He said he had a weird episode last night, it was  Disoriented, dizzy a little and flushed. Pt is very concerned. He has an appt Thursday, but thinks he needs to be seen sooner.

## 2019-01-23 NOTE — Telephone Encounter (Signed)
Triage call reviewed with Dr. Claiborne Billings. Dr. Claiborne Billings would like Clayton [name] moved to Tuesday or Wednesday. New appointment made for Wednesday AM. Spoke with Clayton [name], Clayton [name] verbalized understanding of new appointment date and time. No changes to current regimen at this time.

## 2019-01-24 ENCOUNTER — Encounter: Payer: Self-pay | Admitting: Cardiovascular Disease

## 2019-01-24 ENCOUNTER — Other Ambulatory Visit: Payer: Self-pay

## 2019-01-24 ENCOUNTER — Ambulatory Visit: Payer: Medicare PPO | Admitting: Cardiovascular Disease

## 2019-01-24 VITALS — BP 160/72 | HR 83 | Ht 75.0 in | Wt 232.0 lb

## 2019-01-24 DIAGNOSIS — Z9989 Dependence on other enabling machines and devices: Secondary | ICD-10-CM

## 2019-01-24 DIAGNOSIS — Z7901 Long term (current) use of anticoagulants: Secondary | ICD-10-CM | POA: Diagnosis not present

## 2019-01-24 DIAGNOSIS — G459 Transient cerebral ischemic attack, unspecified: Secondary | ICD-10-CM

## 2019-01-24 DIAGNOSIS — Z0000 Encounter for general adult medical examination without abnormal findings: Secondary | ICD-10-CM

## 2019-01-24 DIAGNOSIS — I4819 Other persistent atrial fibrillation: Secondary | ICD-10-CM | POA: Diagnosis not present

## 2019-01-24 DIAGNOSIS — I10 Essential (primary) hypertension: Secondary | ICD-10-CM

## 2019-01-24 DIAGNOSIS — E669 Obesity, unspecified: Secondary | ICD-10-CM | POA: Diagnosis not present

## 2019-01-24 DIAGNOSIS — G4733 Obstructive sleep apnea (adult) (pediatric): Secondary | ICD-10-CM

## 2019-01-24 NOTE — Patient Instructions (Signed)
Medication Instructions:  No changes *If you need a refill on your cardiac medications before your next appointment, please call your pharmacy*  Lab Work: None Ordered  Testing/Procedures: Your physician has requested that you have an echocardiogram. Echocardiography is a painless test that uses sound waves to create images of your heart. It provides your doctor with information about the size and shape of your heart and how well your heart's chambers and valves are working. This procedure takes approximately one hour. There are no restrictions for this procedure. [facility] has requested that you have a carotid duplex. This test is an ultrasound of the carotid arteries in your neck. It looks at blood flow through these arteries that supply the brain with blood. Allow one hour for this exam. There are no restrictions or special instructions. [place] [address]   Follow-Up: At [facility], you and your health needs are our priority.  As part of our continuing mission to provide you with exceptional heart care, we have created designated Provider Care Teams.  These Care Teams include your primary Cardiologist (physician) and Advanced Practice Providers (APPs -  Physician Assistants and Nurse Practitioners) who all work together to provide you with the care you need, when you need it.  Your next appointment:   2 month(s)  The format for your next appointment:   In Person  Provider:   Shelva Majestic, MD  Other Instructions Referral made to Electrophysiology and Neurology

## 2019-01-24 NOTE — Progress Notes (Signed)
Patient ID: [name], male   DOB: May 02, 1945, 74 y.o.   MRN: [id number]     HPI: Clayton [name] is a 74 y.o. male who presents for a 2 week follow-up cardiology evaluation.  Clayton [name] has a history of hypertension, a remote history of PAF, status post cardioversion in 2007, obstructive sleep apnea for which he uses CPAP 100% of the time, hypertension, and hyperlipidemia. He has had weight fluctuations over the years.  In the past, he also had a history of Mnire's disease.  There was some concern by [name] that perhaps this may have been related to statin use, which led to its discontinuance.  He is not had Mnire's disease in some time.  He underwent left hip replacement surgery by Dr. [name] and now has been able to continue to be active and exercises routinely.   His  blood pressure regimen has been amlodipine/benazepril 10/40 daily, hydrochlorothiazide has been taking 25 Mill grams in the evening and Toprol-XL 100 mg daily. He had atrial of livalo  For hyperlipidemia but developed some diarrhea secondary to this.    He has not had much success with weight loss over the past several years.    He continues to use his CPAP therapy with 100% compliance.  He will not even take a nap without his CPAP therapy and he takes his CPAP unit on alll his travels.   Since initiating CPAP therapy, he is unaware of any palpitations.  He denies any recurrent atrial fibrillation.  In the past he has had difficulty with family stress and his father had passed away, and his wife's mother recently died after having developed lung CA and had significant PVD.  He was not exercising as much as he had in the past.  He has not been successful in weight loss.  He continues to be active and still walks 3-4 miles per day.  Oftentimes in the morning when he awakens his blood pressure is elevated at 150/90 but after exercise it drops to 128/80.  He is unaware of any recurrent atrial fibrillation.  He continues to  use CPAP with 100% compliance.   He completed a two-year grieving process.  On 01/19/2016 he committed to weight loss.  On January 1, he weighed 278 pounds .  He has been walking 4 miles 5 days per week as well as some intermittent stationary bike.  He has lost a total of 32 pounds since January.  He continues to use CPAP with 100% compliance and since he has been on CPAP, he has not had any recurrent atrial fibrillation and his blood pressure has been less labile.  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  He has had failures to Crestor and Lipitor in the past due to significant myalgias, development, even with weekly dosing.  I resumed zetia   In August 2018 he noticed his heart rate increasing and he presented for evaluation and was seen by [name], PAC.  He admitted to a rare palpitation.  He denied any chest pain with exertion or dyspnea on exertion.  He has suffered a broken rib.  This year.  He has a weight goal of 220 pounds.    I  saw him in September 2018, at which time he was doing well from a cardiovascular standpoint.   His father was ill and ultimately passed away in [place].  As result he was going back and forth to the [facility] area.  This resulted  in a change in his diet and ability to exercise.  I last saw him in October 2019 and his weight had increased from 240 back up to 270 pounds.  He was unaware of any episodes of atrial fibrillation.  He was using CPAP with 100% compliance along with his naps.  He was committed to begin weight loss again.    Since I saw him on October 19, 2018 he has had major lifestyle change.  He has been working with Dr. Leafy Ro in the healthy weight and wellness group.  Peak weight was 280 and most recent weight 244.  I saw him for a cardiology evaluation on August 09, 2018.  O ver the past 4 months, he had been under increased stress as result of his wife's illness.  He has been checking his blood pressure regularly and this has been stable  but on his blood pressure recording he has noticed the message of heart rate irregularity.  He is unaware of A. fib but has noticed this message for the last 3 to 4 months.  He is exercising every day and typically walks 2 miles in the morning and in the afternoon or evening does 30 minutes of stationary bike at least 5 to 6 days/week.  He continues to use CPAP.  I obtained a download in the office today from June 22 through August 08, 2018.  He is 100% compliant.  He has a ResMed air sense 10 auto unit with a pressure range of 8-20 with 95% pressure at 10.1 and maximum average and 11.5.  AHI is excellent at 1.2.     During his August 09, 2018 encounter his ECG demonstrated that he was back in atrial fibrillation and had a ventricular rate at 89 bpm.  There was LVH with repolarization changes.  At that time, I recommended discontinuance of amlodipine/benazepril combination and in its place started Cardizem CD 180 mg rather than amlodipine and olmesartan 20 mg rather than benazepril.  I initiated anticoagulation with Eliquis 5 mg twice a day.  He underwent a 2D echo Doppler study on August 16, 2018 which showed EF low normal at 50 to 55%.  There was evidence for significant LVH and moderate dilation of his left atrium.  I saw him for follow-up evaluation on August 23, 2018.  At that time he was now cognizant of his heart rate irregularity.  He has continued to use CPAP with excellent compliance.  A new download was obtained from July 5 through August 21, 2018 which shows 100% compliance with average usage 7 hours and 28 minutes.  AHI is 1.6 and his 95th percentile auto pressure is 10.1 with a maximum average pressure of 11.4.  He states he has lost weight over the past several weeks purposefully.  He continues to exercise now on a stationary bike.  He denies any chest pain PND orthopnea.  He was tolerating Eliquis without bleeding.  During that evaluation, we discussed different options including several additional  weeks of increased weight control versus initiating antiarrhythmic therapy.  Since his only other episode of atrial fibrillation previously occurred 13 years ago and he had not had any recurrence until this year he opted for an increased rate control trial.  As result metoprolol dose was further titrated.  I saw on September 15, 2018 for follow-up evaluation.  At that time remained in atrial fibrillation despite his increase metoprolol succinate to 100 mg in the morning and 50 mg at night with continuation of diltiazem 180  mg, spironolactone 12.5 mg daily in addition to his olmesartan 20 mg.  I scheduled him for DC cardioversion but because of the need to obtain a COVID test I was unable to schedule this to be done by me prior to going on vacation.  He ultimately underwent the successful cardioversion by Dr. Davina Poke successfully and received 1 shock of 200 J of biphasic synchronized rhythm with restoration of sinus rhythm.  Chemistry profile done prior to the cardioversion revealed his creatinine had increased to 1.69 and as result I recommended he reduce his olmesartan down to 10 mg from his dose of 20 mg.  I saw him on October 10, 2018 in follow-up of his cardioversion.  At that time he was maintaining sinus rhythm and feeling well.  He had more energy.  His stress level had significantly reduced since his wife had successful L5-S1 back surgery by Dr. Vertell Limber.  He was continuing to use CPAP with 100% compliance.    I last evaluated him on January 09, 2019.  At that time he stated that over the 3 months previous he had continued to feel well and remained asymptomatic.  At times there are still periods of increased stress.  He continued to use CPAP with 100% compliance.  A download was obtained from November 22 through January 08, 2019 which showed 100% compliance.  Average usage was 6 hours 47 minutes.  AHI is excellent at 1.4 with his AutoSet CPAP minimum pressure set at 8 and maximum of 20, with 95th  percentile pressure 10.2 with maximum average pressure 12.0.  During his evaluation, his ECG verify that he was back in atrial fibrillation.  At the time, he did not inform me that he had noted some irregularity but retrospectively this may have been going on for several weeks prior to that evaluation.  He was continuing to walk daily and exercising on his bike and was asymptomatic without shortness of breath.  During that evaluation I had a long discussion with him regarding possible EP evaluation for consideration of ablation.  After his significant discussion elected to initiate an attempt at antiarrhythmic therapy with low-dose flecainide initially at 50 mg twice a day with plans for office visit in 2 weeks.  He has continued to be on Eliquis for anticoagulation in addition to Zetia for his hyperlipidemia.  He states that he had felt some fatigue once flecainide was instituted.  He was feeling well but this past Sunday he had been working very hard going up and down numerous steps carrying boxes with heavy exertion making at least 40 trips carrying Christmas girls back upstairs.  He was tired.  Sunday evening while sitting down he became disoriented anxious and it appeared that there was possibly some transient stress of aphasia.  He denied any focal weakness.  His symptoms ultimately resolved after 30 minutes.  He had called the office during the workweek and was concerned about his symptoms possibly secondary to flecainide.  He had self reduced his flecainide dose to just once a day rather than twice a day and is worked into my schedule today for follow-up evaluation.  Presently, he has no residual issues since that event several days ago.  He presents for evaluation is in the office with his wife.  Past Medical History:  Diagnosis Date  . Arthritis    hips   . Atrial fibrillation ([place])   . Cataract   . Dysrhythmia    PAF- hx of 7 years ago   .  HLD (hyperlipidemia)   . Hypertension 05/29/10    ECHO-EF 67% wnl  . Meniere disease   . Obesity   . Palpitations   . Sleep apnea 09/14/04   [facility]- Dr. Humphrey Rolls; CPAP titration 11/12/04- Dr. Humphrey Rolls    Past Surgical History:  Procedure Laterality Date  . CARDIAC CATHETERIZATION  11/03/05  . CARDIOVERSION N/A 09/26/2018   Procedure: CARDIOVERSION;  Surgeon: Geralynn Rile, MD;  Location: [facility];  Service: Endoscopy;  Laterality: N/A;  . CATARACT EXTRACTION  2014  . KNEE ARTHROSCOPY     left knee- 2002   . TOTAL HIP ARTHROPLASTY  06/08/2011   Procedure: TOTAL HIP ARTHROPLASTY ANTERIOR APPROACH;  Surgeon: [name], MD;  Location: [facility];  Service: Orthopedics;  Laterality: Left;    Allergies  Allergen Reactions  . Statins Other (See Comments)    Crestor - Intestinal issues     Current Outpatient Medications  Medication Sig Dispense Refill  . Ascorbic Acid (VITAMIN C) 1000 MG tablet Take 1,000 mg by mouth daily.     Marland Kitchen aspirin EC 81 MG tablet Take 1 tablet (81 mg total) by mouth daily. 90 tablet 3  . [place]-Glucosamine-Vit D (GLUCOSAMINE COMPLEX PO) Take 3 tablets by mouth every morning.     . Coenzyme Q10 200 MG capsule Take 200 mg by mouth daily.     Marland Kitchen ELIQUIS 5 MG TABS tablet TAKE 1 TABLET BY MOUTH TWICE A DAY 180 tablet 2  . ezetimibe (ZETIA) 10 MG tablet TAKE 1 TABLET BY MOUTH EVERY DAY 90 tablet 3  . flecainide (TAMBOCOR) 50 MG tablet Take 1 tablet (50 mg total) by mouth 2 (two) times daily. 180 tablet 3  . LIVALO 2 MG TABS TAKE 1 TABLET BY MOUTH EVERY DAY 90 tablet 3  . metFORMIN (GLUCOPHAGE) 500 MG tablet Take 1 tablet (500 mg total) by mouth daily with breakfast. 30 tablet 0  . metoprolol succinate (TOPROL-XL) 100 MG 24 hr tablet Take 100 mg in the evenings (along with 25 mg or 50 mg in the mornings) (Patient taking differently: Take 100 mg by mouth every morning. Take 100 mg in the evenings (along with 50 mg in the mornings)) 90 tablet 3  . metoprolol succinate (TOPROL-XL) 25 MG 24 hr  tablet TAKE 25 MG IN THE MORNING (ALONG WITH 100 MG IN THE EVENING) FOR 5 DAYS- THEN IF PULSE STILL IN 70'S INCREASE TO 50 MG IN THE MORNING (ALONG WITH 100 MG IN THE EVENING) 180 tablet 1  . Multiple Vitamin (MULITIVITAMIN WITH MINERALS) TABS Take 1 tablet by mouth every evening.     . olmesartan (BENICAR) 20 MG tablet TAKE 1 TABLET BY MOUTH EVERY DAY 90 tablet 2  . Omega 3-6-9 Fatty Acids (OMEGA-3-6-9 PO) Take 3 capsules by mouth every morning. Omega 3= '800mg'$ ,omega 6='380mg'$ ,omega 9 ='300mg'$     . spironolactone (ALDACTONE) 25 MG tablet TAKE 1/2 TABLET BY MOUTH EVERY DAY 45 tablet 3  . triamcinolone cream (KENALOG) 0.5 % APPLY 1 APPLICATION TOPICALLY 2 (TWO) TIMES DAILY. TO AFFECTED AREAS. (Patient taking differently: Apply 1 application topically 2 (two) times daily as needed (irritation). To affected areas.) 30 g 3  . zinc gluconate 50 MG tablet Take 50 mg by mouth every evening.     Marland Kitchen Zn-Pyg Afri-Nettle-Saw Palmet (SAW PALMETTO COMPLEX PO) Take 1 capsule by mouth every morning.     . diltiazem (CARDIZEM CD) 180 MG 24 hr capsule Take 1 capsule (180 mg total) by mouth daily.  90 capsule 3   No current facility-administered medications for this visit.    Socially he is remarried after his first wife passed away. He has 2 children. 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. there is no tobacco history.  He plays golf at least 2 days/week and this past year not only did he have a hole in 1 but he has also shot his age.  ROS General: Negative; No fevers, chills, or night sweats;  HEENT: Negative; No changes in vision or hearing, sinus congestion, difficulty swallowing Pulmonary: Negative; No cough, wheezing, shortness of breath, hemoptysis Cardiovascular: See HPI GI: Negative; No nausea, vomiting, diarrhea, or abdominal pain GU: Negative; No dysuria, hematuria, or difficulty voiding Musculoskeletal: He is status post rib fracture.  He is status post hip  replacement. Hematologic/Oncology: Negative; no easy bruising, bleeding Endocrine: Negative; no heat/cold intolerance; no diabetes Neuro: No vertigo or imbalance issues Skin: Negative; No rashes or skin lesions Psychiatric: Negative; No behavioral problems, depression Sleep: Positive for obstructive sleep apnea on CPAP therapy with 100% compliance No snoring, daytime sleepiness, hypersomnolence, bruxism, restless legs, hypnogognic hallucinations, no cataplexy Other comprehensive 14 point system review is negative.  PE BP (!) 160/72   Pulse 83   Ht '6\' 3"'$  (1.905 m)   Wt 232 lb (105.2 kg)   BMI 29.00 kg/m    Repeat blood pressure by me was 160/76.  He was anxious.  Wt Readings from Last 3 Encounters:  01/24/19 232 lb (105.2 kg)  01/09/19 236 lb 3.2 oz (107.1 kg)  10/30/18 226 lb (102.5 kg)  He  has lost 40 pounds over the past year.  General: Alert, oriented, no distress.  Skin: normal turgor, no rashes, warm and dry HEENT: Normocephalic, atraumatic. Pupils equal round and reactive to light; sclera anicteric; extraocular muscles intact; Nose without nasal septal hypertrophy Mouth/Parynx benign; Mallinpatti scale 3 Neck: No JVD, no carotid bruits; normal carotid upstroke Lungs: clear to ausculatation and percussion; no wheezing or rales Chest wall: without tenderness to palpitation Heart: PMI not displaced, irregularly irregular in the 90s, s1 s2 normal, 1/6 systolic murmur, no diastolic murmur, no rubs, gallops, thrills, or heaves Abdomen: soft, nontender; no hepatosplenomehaly, BS+; abdominal aorta nontender and not dilated by palpation. Back: no CVA tenderness Pulses 2+ Musculoskeletal: full range of motion, normal strength, no joint deformities Extremities: no clubbing cyanosis or edema, Homan's sign negative  Neurologic: grossly nonfocal; Cranial nerves grossly wnl; normal strength. Psychologic: Normal mood and affect  ECG (independently read by me): Atrial fibrillation at  83 bpm.  Borderline LVH by voltage criteria in aVL; QTc interval 446 ms.  January 09, 2019 ECG (independently read by me): Atrial fibrillation ar 97, RBBB; Left axis deviation; Inferior Q waves, unchanged. QTc 487    August 09, 2018 ECG (independently read by me): Normal sinus rhythm at 72 bpm.  Left Axis deviation.  QTc interval 429 ms.  September 15, 2018 ECG (independently read by me): Atrial fibrillation at 92 bpm.  Right bundle branch block with repolarization changes.  August 23, 2018 ECG (independently read by me): Atrial fibrillation at 96 bpm.  LVH with repolarization changes.  Left axis deviation.  Left anterior hemiblock.  Inferior Q waves  August 09, 2018 ECG (independently read by me):Atrial fibrillatoion at 58; LVH with repolarization  October 219 ECG (independently read by me): Normal sinus rhythm at 81 bpm.  Nonspecific intraventricular conduction delay.  Left axis deviation  September 2018 ECG (independently read by me): Normal sinus  rhythm at 63 bpm.  Left bundle branch block with repolarization changes.  Normal intervals.  No ectopy.  April 2018 ECG (independently read by me): Normal sinus rhythm at 68 bpm.  LVH with repolarization.  Normal intervals.  November 2017 ECG (independently read by me): Normal sinus rhythm at 78 bpm.  Nonspecific interventricular delay.  QRS complex V1 V2.  August 2016 ECG (independently read by me): Normal sinus rhythm at 77 bpm.  LVH with repolarization changes.  Left axis deviation.  December 2015 ECG (independently read by me): Normal sinus rhythm at 87 bpm.  LVH by voltage criteria with T-wave changes in leads 1 and aVL.  July 2014 ECG: Normal sinus rhythm at 76 beats per minute. LVH with QRS widening.  LABS:  BMP Latest Ref Rng & Units 01/09/2019 01/05/2019 10/10/2018  Glucose 65 - 99 mg/dL 96 99 95  BUN 8 - 27 mg/dL 30(H) 25 35(H)  Creatinine 0.76 - 1.27 mg/dL 1.31(H) 1.29(H) 1.42(H)  BUN/Creat Ratio 10 - _0 25(H)  Sodium 134 - 144  mmol/L 142 137 140  Potassium 3.5 - 5.2 mmol/L 4.8 4.6 4.8  Chloride 96 - 106 mmol/L 106 103 103  CO2 20 - 29 mmol/L _1 Calcium 8.6 - 10.2 mg/dL 10.0 9.7 10.3(H)   Hepatic Function Latest Ref Rng & Units 01/09/2019 01/05/2019 09/22/2018  Total Protein 6.0 - 8.5 g/dL 6.6 6.7 6.7  Albumin 3.7 - 4.7 g/dL 4.5 4.6 4.8(H)  AST 0 - 40 IU/L _2 ALT 0 - 44 IU/L _3 Alk Phosphatase 39 - 117 IU/L 64 65 52  Total Bilirubin 0.0 - 1.2 mg/dL 0.8 0.7 1.4(H)   CBC Latest Ref Rng & Units 09/22/2018 08/09/2018 12/08/2015  WBC 3.4 - 10.8 x10E3/uL 6.1 6.6 5.4  Hemoglobin 13.0 - 17.7 g/dL 15.4 15.7 15.2  Hematocrit 37.5 - 51.0 % 46.2 47.5 44.1  Platelets 150 - 450 x10E3/uL 187 208 194   Lab Results  Component Value Date   MCV 95 09/22/2018   MCV 92 08/09/2018   MCV 90.6 12/08/2015   Lab Results  Component Value Date   TSH 1.290 08/09/2018    Lab Results  Component Value Date   HGBA1C 5.8 (H) 09/04/2018    Lipid Panel     Component Value Date/Time   CHOL 114 01/05/2019 1003   TRIG 39 01/05/2019 1003   HDL 57 01/05/2019 1003   CHOLHDL 2.0 01/05/2019 1003   CHOLHDL 3.8 10/12/2016 0902   VLDL 17 04/14/2016 0937   LDLCALC 47 01/05/2019 1003   LDLCALC 119 (H) 10/12/2016 0902     RADIOLOGY: No results found.  IMPRESSION:  1. Persistent atrial fibrillation ([place])   2. Essential hypertension   3. OSA on CPAP   4. Anticoagulated   5. Mild obesity   6. Question TIA (transient ischemic attack)   7. Evaluation by medical service required      ASSESSMENT AND PLAN: Mr Lorge is a 74 year old gentleman who has a long-standing history of hypertension and had been on amlodipine/benazepril 10/40, HCTZ 25 mg, Toprol-XL 100 mg in addition to spironolactone 12.5 mg daily.  He had had a prior isolated episode of atrial fibrillation in 2007 and had undergone cardioversion.  On this regimen his blood pressure had stabilized and consistently was well controlled and he had not had any  recurrent episodes of atrial fibrillation.   He had gained significant amount of weight since August 2019.  He initiated a major lifestyle change and has been working closely with Dr. Leafy Ro from healthy weight [facility] wellness group resulting in a greater than 40 pound weight loss over the past year.  When I saw him in late July 2020 he was in atrial fibrillation of questionable duration.  At the time retrospectively he believes he may have been in A. fib for 3 to 4 months duration.  I gradually titrated his medical regimen and changed his benazepril to diltiazem and olmesartan to take in addition to metoprolol succinate and his spironolactone 12.5 mg daily.  He had continued to use CPAP with 100% compliance.  His only previous episode of A. fib prior to this episode had occurred in 2007 and he had maintained sinus rhythm until this past year.  He underwent successful cardioversion on September 26, 2018 with restoration of sinus rhythm.  When I saw him on December 22 he was continuing to admit to having periods of increased stress.  At that time, his ECG showed that he was back in atrial fibrillation of questionable duration.  In retrospect, he believes that he may have been in atrial fibrillation for 3 to 4 weeks prior to that December evaluation and was hopeful that reducing his stress would rectify the problem.  He had continue to use CPAP with 100% compliance and was sleeping well.  Flecainide at 50 mg twice a day was started on January 09, 2019.  January 3, he admitted to pretty significant heavy exertion carrying objects up and down steps with at least 40 trips.  He then developed an episode of disorientation increased anxiety with possible transient expressive aphasia without focal hemiparesis.  His symptoms lasted approximately 30 minutes and self resolved.  His ECG today confirms that he is still in atrial fibrillation.  His QTC is normal and not prolonged.  He had been on flecainide 50 mg twice a day for 2  weeks but for the past 2 days had only taken 50 mg daily.  I am concerned that his symptoms on Sunday perhaps may have been related to a TIA due to potential small plaque embolization rather than a side effect of flecainide.  I am also recommending a 24-monthfollow-up echo Doppler evaluation.  I am recommending he undergo carotid duplex imaging to assess his carotid arteries.  I have suggested a neurologic evaluation with consideration of possible MRA or MRI imaging per neurology.  I also discussed an EP evaluation.  Presently I have recommended he continue flecainide 50 twice daily with his normal QTC and will not increase this further but recommend evaluation for consideration of possible atrial fibrillation ablation.  We will try to schedule him for EP evaluation the next several weeks and will leave the decision of antiarrhythmic therapy per their  recommendations.  Time spent: 30 minutes TTroy Sine MD, [facility] 01/26/2019 6:33 PM

## 2019-01-25 ENCOUNTER — Ambulatory Visit: Payer: Medicare Other | Admitting: Cardiovascular Disease

## 2019-01-26 ENCOUNTER — Encounter: Payer: Self-pay | Admitting: Cardiovascular Disease

## 2019-01-29 ENCOUNTER — Ambulatory Visit (HOSPITAL_COMMUNITY)
Admission: RE | Admit: 2019-01-29 | Discharge: 2019-01-29 | Disposition: A | Discharge status: Home / Self Care | Payer: Medicare PPO | Source: Ambulatory Visit | Attending: Cardiology | Admitting: Cardiology

## 2019-01-29 ENCOUNTER — Other Ambulatory Visit: Payer: Self-pay

## 2019-01-29 DIAGNOSIS — I4819 Other persistent atrial fibrillation: Secondary | ICD-10-CM

## 2019-01-31 ENCOUNTER — Other Ambulatory Visit: Payer: Self-pay

## 2019-01-31 ENCOUNTER — Encounter (INDEPENDENT_AMBULATORY_CARE_PROVIDER_SITE_OTHER): Payer: Self-pay | Admitting: Family Medicine

## 2019-01-31 ENCOUNTER — Telehealth (INDEPENDENT_AMBULATORY_CARE_PROVIDER_SITE_OTHER): Payer: Medicare PPO | Admitting: Family Medicine

## 2019-01-31 DIAGNOSIS — Z6830 Body mass index (BMI) 30.0-30.9, adult: Secondary | ICD-10-CM | POA: Diagnosis not present

## 2019-01-31 DIAGNOSIS — I4891 Unspecified atrial fibrillation: Secondary | ICD-10-CM | POA: Diagnosis not present

## 2019-01-31 DIAGNOSIS — E66811 Obesity, class 1: Secondary | ICD-10-CM

## 2019-01-31 DIAGNOSIS — E669 Obesity, unspecified: Secondary | ICD-10-CM

## 2019-01-31 DIAGNOSIS — R7303 Prediabetes: Secondary | ICD-10-CM

## 2019-01-31 MED ORDER — METFORMIN HCL 500 MG PO TABS: 500.0000 mg | ORAL_TABLET | Freq: Every day | ORAL | 0 refills | Status: DC

## 2019-02-01 ENCOUNTER — Other Ambulatory Visit: Payer: Self-pay

## 2019-02-01 ENCOUNTER — Ambulatory Visit (HOSPITAL_COMMUNITY): Payer: Medicare PPO | Attending: Cardiology

## 2019-02-01 DIAGNOSIS — I4819 Other persistent atrial fibrillation: Secondary | ICD-10-CM | POA: Insufficient documentation

## 2019-02-01 NOTE — Progress Notes (Signed)
TeleHealth Visit:  Due to the COVID-19 pandemic, this visit was completed with telemedicine (audio/video) technology to reduce patient and provider exposure as well as to preserve personal protective equipment.   Clayton [name] has verbally consented to this TeleHealth visit. The patient [name] located at home, the provider [name] located at the [facility] office. The participants in this visit include the listed provider and patient. Burlen was unable to use realtime audiovisual technology today and the telehealth visit was conducted via telephone.  Chief Complaint: OBESITY Clayton [name] [name] here to discuss his progress with his obesity treatment plan along with follow-up of his obesity related diagnoses. Clayton [name] [name] keeping a food journal and adhering to recommended goals of 1800 calories and 100 grams of protein daily or following a lower carbohydrate, vegetable and lean protein rich diet plan and states he [name] following his eating plan approximately 80-85% of the time. Clayton [name] states he [name] walking and biking for 30-80 minutes 7 times per week.  Today's visit was #: 15 Starting weight: 266 lbs Starting date: 11/01/17  Interim History: Clayton [name] feels he [name] doing well with his Low Carbohydrate plan with journaling. He [name] exercising regularly and has been feeling better except a recent flare up of aFib, which [name] being worked up. His weight at home was 228 lbs.  Subjective:   1. Atrial fibrillation, unspecified type (Clayton [name]) I reviewed Dr. Evette Georges recent note with potential plans for an ablation. He [name] feeling well, and carotid doppler was within normal limits. He has an upcoming echocardiogram scheduled.   2. Pre-diabetes Clayton [name]'s last A1c was 5.8 last year. He has been working on diet and weight loss, and he [name] tolerating metformin well.  Assessment/Plan:   1. Atrial fibrillation, unspecified type (Clayton [name]) Clayton [name] [name] to continue with diet and exercise, and we will continue to follow  his progress.  2. Pre-diabetes Clayton [name] will continue to work on weight loss, exercise, and decreasing simple carbohydrates to help decrease the risk of diabetes. We will refill metformin for 1 month, and will continue to monitor.  - metFORMIN (GLUCOPHAGE) 500 MG tablet; Take 1 tablet (500 mg total) by mouth daily with breakfast.  Dispense: 30 tablet; Refill: 0  3. Class 1 obesity with serious comorbidity and body mass index (BMI) of 30.0 to 30.9 in adult, unspecified obesity type Clayton [name] [name] currently in the action stage of change. As such, his goal [name] to continue with weight loss efforts. He has agreed to following a lower carbohydrate, vegetable and lean protein rich diet plan.   We discussed the following exercise goals today: Clayton [name].  We discussed the following behavioral modification strategies today: decreasing simple carbohydrates and avoiding temptations.  Clayton [name] has agreed to follow-up with our clinic in 3 weeks. He was informed of the importance of frequent follow-up visits to maximize his success with intensive lifestyle modifications for his multiple health conditions.  Objective:   VITALS: Per patient if applicable, see vitals. GENERAL: Alert and in no acute distress. CARDIOPULMONARY: No increased WOB. Speaking in clear sentences.  PSYCH: Pleasant and cooperative. Speech normal rate and rhythm. Affect [name] appropriate. Insight and judgement are appropriate. Attention [name] focused, linear, and appropriate.  NEURO: Oriented as arrived to appointment on time with no prompting.   Lab Results  Component Value Date   CREATININE 1.31 (H) 01/09/2019   BUN 30 (H) 01/09/2019   NA 142 01/09/2019   K  4.8 01/09/2019   CL 106 01/09/2019   CO2 21 01/09/2019   Lab Results  Component Value Date   ALT 31 01/09/2019   AST 30 01/09/2019   ALKPHOS 64 01/09/2019   BILITOT 0.8 01/09/2019   Lab Results  Component Value Date   HGBA1C  5.8 (H) 09/04/2018   HGBA1C 6.0 (H) 11/01/2017   HGBA1C 5.4 04/14/2016   HGBA1C 6.0 06/09/2015   Lab Results  Component Value Date   INSULIN 9.0 09/04/2018   INSULIN 22.4 11/01/2017   Lab Results  Component Value Date   TSH 1.290 08/09/2018   Lab Results  Component Value Date   CHOL 114 01/05/2019   HDL 57 01/05/2019   LDLCALC 47 01/05/2019   TRIG 39 01/05/2019   CHOLHDL 2.0 01/05/2019   Lab Results  Component Value Date   WBC 6.1 09/22/2018   HGB 15.4 09/22/2018   HCT 46.2 09/22/2018   MCV 95 09/22/2018   PLT 187 09/22/2018   No results found for: IRON, TIBC, FERRITIN  Attestation Statements:   Reviewed by clinician on day of visit: allergies, medications, problem list, medical history, surgical history, family history, social history, and previous encounter notes.  Time spent on visit including pre-visit chart review and post-visit care was 32 minutes.   I, Trixie Dredge, am acting as transcriptionist for Dennard Nip, MD.  I have reviewed the above documentation for accuracy and completeness, and I agree with the above. - Dennard Nip, MD

## 2019-02-06 ENCOUNTER — Telehealth: Payer: Self-pay | Admitting: Cardiology

## 2019-02-06 NOTE — Telephone Encounter (Signed)
New Message     Pt is calling and says he would like to have his wife assist him to his appt because it is a consultation and the information he wants shared with his wife    Please advise

## 2019-02-07 NOTE — Telephone Encounter (Signed)
Returned pt call. Informed of our Covid "policy". Made aware that he could call his wife and have her on facetime/speakerphone to be part of the visit, but not be physically present in the room. He was agreeable, stated they just want to make sure she can be part of the visit in some way. Patient verbalized understanding and agreeable to plan.

## 2019-02-08 ENCOUNTER — Encounter: Payer: Self-pay | Admitting: Cardiology

## 2019-02-08 ENCOUNTER — Other Ambulatory Visit: Payer: Self-pay

## 2019-02-08 ENCOUNTER — Ambulatory Visit (INDEPENDENT_AMBULATORY_CARE_PROVIDER_SITE_OTHER): Payer: Medicare PPO | Admitting: Cardiology

## 2019-02-08 VITALS — BP 160/98 | HR 86 | Ht 75.0 in | Wt 235.6 lb

## 2019-02-08 DIAGNOSIS — I4819 Other persistent atrial fibrillation: Secondary | ICD-10-CM | POA: Diagnosis not present

## 2019-02-08 NOTE — Patient Instructions (Signed)
Medication Instructions:  Your physician recommends that you continue on your current medications as directed. Please refer to the Current Medication list given to you today.  *If you need a refill on your cardiac medications before your next appointment, please call your pharmacy.  Labwork: None ordered If you have labs (blood work) drawn today and your tests are completely normal, you will receive your results only by:  [place] (if you have MyChart) OR  A paper copy in the mail If you have any lab test that is abnormal or we need to change your treatment, we will call you to review the results.  Testing/Procedures: None ordered  Follow-Up: To be determined after your neurology appointment next month.   Thank you for choosing [facility]!! Trinidad Curet, RN [phone]   Any Other Special Instructions Will Be Listed Below    Cardiac Ablation Cardiac ablation is a procedure to disable (ablate) a small amount of heart tissue in very specific places. The heart has many electrical connections. Sometimes these connections are abnormal and can cause the heart to beat very fast or irregularly. Ablating some of the problem areas can improve the heart rhythm or return it to normal. Ablation may be done for people who:  Have Wolff-Parkinson-White syndrome.  Have fast heart rhythms (tachycardia).  Have taken medicines for an abnormal heart rhythm (arrhythmia) that were not effective or caused side effects.  Have a high-risk heartbeat that may be life-threatening. During the procedure, a small incision is made in the neck or the groin, and a long, thin, flexible tube (catheter) is inserted into the incision and moved to the heart. Small devices (electrodes) on the tip of the catheter will send out electrical currents. A type of X-ray (fluoroscopy) will be used to help guide the catheter and to provide images of the heart. Tell a health care provider about:  Any allergies  you have.  All medicines you are taking, including vitamins, herbs, eye drops, creams, and over-the-counter medicines.  Any problems you or family members have had with anesthetic medicines.  Any blood disorders you have.  Any surgeries you have had.  Any medical conditions you have, such as kidney failure.  Whether you are pregnant or may be pregnant. What are the risks? Generally, this is a safe procedure. However, problems may occur, including:  Infection.  Bruising and bleeding at the catheter insertion site.  Bleeding into the chest, especially into the sac that surrounds the heart. This is a serious complication.  Stroke or blood clots.  Damage to other structures or organs.  Allergic reaction to medicines or dyes.  Need for a permanent pacemaker if the normal electrical system is damaged. A pacemaker is a small computer that sends electrical signals to the heart and helps your heart beat normally.  The procedure not being fully effective. This may not be recognized until months later. Repeat ablation procedures are sometimes required. What happens before the procedure?  Follow instructions from your health care provider about eating or drinking restrictions.  Ask your health care provider about: ? Changing or stopping your regular medicines. This is especially important if you are taking diabetes medicines or blood thinners. ? Taking medicines such as aspirin and ibuprofen. These medicines can thin your blood. Do not take these medicines before your procedure if your health care provider instructs you not to.  Plan to have someone take you home from the hospital or clinic.  If you will be going home right after  the procedure, plan to have someone with you for 24 hours. What happens during the procedure?  To lower your risk of infection: ? Your health care team will wash or sanitize their hands. ? Your skin will be washed with soap. ? Hair may be removed from the  incision area.  An IV tube will be inserted into one of your veins.  You will be given a medicine to help you relax (sedative).  The skin on your neck or groin will be numbed.  An incision will be made in your neck or your groin.  A needle will be inserted through the incision and into a large vein in your neck or groin.  A catheter will be inserted into the needle and moved to your heart.  Dye may be injected through the catheter to help your surgeon see the area of the heart that needs treatment.  Electrical currents will be sent from the catheter to ablate heart tissue in desired areas. There are three types of energy that may be used to ablate heart tissue: ? Heat (radiofrequency energy). ? Laser energy. ? Extreme cold (cryoablation).  When the necessary tissue has been ablated, the catheter will be removed.  Pressure will be held on the catheter insertion area to prevent excessive bleeding.  A bandage (dressing) will be placed over the catheter insertion area. The procedure may vary among health care providers and hospitals. What happens after the procedure?  Your blood pressure, heart rate, breathing rate, and blood oxygen level will be monitored until the medicines you were given have worn off.  Your catheter insertion area will be monitored for bleeding. You will need to lie still for a few hours to ensure that you do not bleed from the catheter insertion area.  Do not drive for 24 hours or as long as directed by your health care provider. Summary  Cardiac ablation is a procedure to disable (ablate) a small amount of heart tissue in very specific places. Ablating some of the problem areas can improve the heart rhythm or return it to normal.  During the procedure, electrical currents will be sent from the catheter to ablate heart tissue in desired areas. This information is not intended to replace advice given to you by your health care provider. Make sure you discuss  any questions you have with your health care provider. Document Revised: 06/27/2017 Document Reviewed: 11/24/2015 Elsevier Patient Education  [place].

## 2019-02-08 NOTE — Progress Notes (Signed)
Electrophysiology Office Note   Date:  02/08/2019   ID:  Clayton [name], DOB 08-Nov-1945, MRN [id number]  PCP:  Ma Hillock, DO  Cardiologist:  Claiborne Billings Primary Electrophysiologist:  Hussein Macdougal Meredith Leeds, MD    Chief Complaint: AF   History of Present Illness: Clayton [name] is a 74 y.o. male who is being seen today for the evaluation of AF at the request of [name], MD. Presenting today for electrophysiology evaluation.  He has a history significant for atrial fibrillation, hypertension, sleep apnea.  He is currently on flecainide.  Unfortunately he has had some fatigue since flecainide was started.  A few weeks ago, he had an episode of disorientation and anxiousness, with possibly some transient aphasia.  He denied focal weakness.  His symptoms resolved after 30 minutes.  Due to that, he can decrease his dose of flecainide to once a day.  Today, he denies symptoms of chest pain, shortness of breath, orthopnea, PND, lower extremity edema, claudication, dizziness, presyncope, syncope, bleeding, or neurologic sequela. The patient is tolerating medications without difficulties.  His main AF complaint is due to palpitations.  He does not have much in the way of weakness and fatigue.  He does also have quite a bit of anxiety due to his atrial fibrillation.  He feels that anxiety is a major contributor to his overall symptoms.   Past Medical History:  Diagnosis Date  . Arthritis    hips   . Atrial fibrillation ([place])   . Cataract   . Dysrhythmia    PAF- hx of 7 years ago   . HLD (hyperlipidemia)   . Hypertension 05/29/10   ECHO-EF 67% wnl  . Meniere disease   . Obesity   . Palpitations   . Sleep apnea 09/14/04   [facility]- Dr. Humphrey Rolls; CPAP titration 11/12/04- Dr. Humphrey Rolls   Past Surgical History:  Procedure Laterality Date  . CARDIAC CATHETERIZATION  11/03/05  . CARDIOVERSION N/A 09/26/2018   Procedure: CARDIOVERSION;  Surgeon: Geralynn Rile, MD;   Location: [facility];  Service: Endoscopy;  Laterality: N/A;  . CATARACT EXTRACTION  2014  . KNEE ARTHROSCOPY     left knee- 2002   . TOTAL HIP ARTHROPLASTY  06/08/2011   Procedure: TOTAL HIP ARTHROPLASTY ANTERIOR APPROACH;  Surgeon: [name], MD;  Location: [facility];  Service: Orthopedics;  Laterality: Left;     Current Outpatient Medications  Medication Sig Dispense Refill  . Ascorbic Acid (VITAMIN C) 1000 MG tablet Take 1,000 mg by mouth daily.     Marland Kitchen aspirin EC 81 MG tablet Take 1 tablet (81 mg total) by mouth daily. 90 tablet 3  . [place]-Glucosamine-Vit D (GLUCOSAMINE COMPLEX PO) Take 3 tablets by mouth every morning.     . Coenzyme Q10 200 MG capsule Take 200 mg by mouth daily.     Marland Kitchen diltiazem (CARDIZEM CD) 180 MG 24 hr capsule Take 1 capsule (180 mg total) by mouth daily. 90 capsule 3  . ELIQUIS 5 MG TABS tablet TAKE 1 TABLET BY MOUTH TWICE A DAY 180 tablet 2  . ezetimibe (ZETIA) 10 MG tablet TAKE 1 TABLET BY MOUTH EVERY DAY 90 tablet 3  . flecainide (TAMBOCOR) 50 MG tablet Take 1 tablet (50 mg total) by mouth 2 (two) times daily. 180 tablet 3  . LIVALO 2 MG TABS TAKE 1 TABLET BY MOUTH EVERY DAY 90 tablet 3  . metFORMIN (GLUCOPHAGE) 500 MG tablet Take 1 tablet (500 mg total)  by mouth daily with breakfast. 30 tablet 0  . metoprolol succinate (TOPROL-XL) 100 MG 24 hr tablet Take 100 mg in the evenings (along with 25 mg or 50 mg in the mornings) 90 tablet 3  . metoprolol succinate (TOPROL-XL) 25 MG 24 hr tablet TAKE 25 MG IN THE MORNING (ALONG WITH 100 MG IN THE EVENING) FOR 5 DAYS- THEN IF PULSE STILL IN 70'S INCREASE TO 50 MG IN THE MORNING (ALONG WITH 100 MG IN THE EVENING) 180 tablet 1  . Multiple Vitamin (MULITIVITAMIN WITH MINERALS) TABS Take 1 tablet by mouth every evening.     . olmesartan (BENICAR) 20 MG tablet TAKE 1 TABLET BY MOUTH EVERY DAY 90 tablet 2  . Omega 3-6-9 Fatty Acids (OMEGA-3-6-9 PO) Take 3 capsules by mouth every morning. Omega 3= 800mg ,omega 6=380mg ,omega  9 =300mg     . spironolactone (ALDACTONE) 25 MG tablet TAKE 1/2 TABLET BY MOUTH EVERY DAY 45 tablet 3  . triamcinolone cream (KENALOG) 0.5 % APPLY 1 APPLICATION TOPICALLY 2 (TWO) TIMES DAILY. TO AFFECTED AREAS. 30 g 3  . zinc gluconate 50 MG tablet Take 50 mg by mouth every evening.     Marland Kitchen Zn-Pyg Afri-Nettle-Saw Palmet (SAW PALMETTO COMPLEX PO) Take 1 capsule by mouth every morning.      No current facility-administered medications for this visit.    Allergies:   Rosuvastatin and Statins   Social History:  The patient  reports that he has never smoked. He has never used smokeless tobacco. He reports that he does not drink alcohol or use drugs.   Family History:  The patient's family history includes Arthritis in his father; Diabetes in his maternal grandfather, mother, and sister; Heart disease in his father, maternal grandfather, and paternal grandfather; Hyperlipidemia in his mother; Hypertension in his maternal grandfather and mother; Obesity in his mother; Stroke in his maternal grandmother and mother.    ROS:  Please see the history of present illness.   Otherwise, review of systems is positive for none.   All other systems are reviewed and negative.    PHYSICAL EXAM: VS:  BP (!) 160/98   Pulse 86   Ht 6\' 3"  (1.905 m)   Wt 235 lb 9.6 oz (106.9 kg)   SpO2 98%   BMI 29.45 kg/m  , BMI Body mass index is 29.45 kg/m. GEN: Well nourished, well developed, in no acute distress  HEENT: normal  Neck: no JVD, carotid bruits, or masses Cardiac: RRR; no murmurs, rubs, or gallops,no edema  Respiratory:  clear to auscultation bilaterally, normal work of breathing GI: soft, nontender, nondistended, + BS MS: no deformity or atrophy  Skin: warm and dry Neuro:  Strength and sensation are intact Psych: euthymic mood, full affect  EKG:  EKG is not ordered today. Personal review of the ekg ordered 01/23/18 shows atrial fibrillation, rate 83  Recent Labs: 08/09/2018: TSH 1.290 09/22/2018:  Hemoglobin 15.4; Platelets 187 01/09/2019: ALT 31; BUN 30; Creatinine, Ser 1.31; Magnesium 2.3; Potassium 4.8; Sodium 142    Lipid Panel     Component Value Date/Time   CHOL 114 01/05/2019 1003   TRIG 39 01/05/2019 1003   HDL 57 01/05/2019 1003   CHOLHDL 2.0 01/05/2019 1003   CHOLHDL 3.8 10/12/2016 0902   VLDL 17 04/14/2016 0937   LDLCALC 47 01/05/2019 1003   LDLCALC 119 (H) 10/12/2016 0902     Wt Readings from Last 3 Encounters:  02/08/19 235 lb 9.6 oz (106.9 kg)  01/24/19 232 lb (105.2 kg)  01/09/19 236 lb 3.2 oz (107.1 kg)      Other studies Reviewed: Additional studies/ records that were reviewed today include: TTE 02/01/2019 Review of the above records today demonstrates:   1. Left ventricular ejection fraction, by visual estimation, is 50 to 55%. The left ventricle has low normal function. There is moderately increased left ventricular hypertrophy.  2. Left ventricular diastolic function could not be evaluated.  3. Mildly dilated left ventricular internal cavity size.  4. The left ventricle has no regional wall motion abnormalities.  5. Global right ventricle has normal systolic function.The right ventricular size is normal. No increase in right ventricular wall thickness.  6. Left atrial size was mildly dilated.  7. Right atrial size was normal.  8. The mitral valve is normal in structure. Trivial mitral valve regurgitation.  9. The tricuspid valve is normal in structure. 10. The aortic valve is tricuspid. Aortic valve regurgitation is not visualized. No evidence of aortic valve sclerosis or stenosis. 11. The pulmonic valve was grossly normal. Pulmonic valve regurgitation is not visualized. 12. Aortic dilatation noted. 13. There is mild dilatation of the ascending aorta measuring 39 mm. 14. The inferior vena cava is normal in size with greater than 50% respiratory variability, suggesting right atrial pressure of 3 mmHg.   ASSESSMENT AND PLAN:  1.  Persistent atrial  fibrillation: CHA2DS2-VASc of at least 2.  Currently on flecainide, metoprolol, Eliquis.  Currently being worked up by neurology for his possible TIA.  I discussed with him continuing flecainide and cardioversion versus ablation.  He Harsimran Westman think about this and we Megean Fabio touch base after his neurology follow-up.  2.  Hypertension: Elevated today but is usually well controlled.  No changes.  3.  OSA: CPAP compliance encouraged  4.  TIA: Has been referred to neurology.  Case discussed with referring cardiologist  Current medicines are reviewed at length with the patient today.   The patient does not have concerns regarding his medicines.  The following changes were made today:  none  Labs/ tests ordered today include:  No orders of the defined types were placed in this encounter.    Disposition:   FU with Kitzia Camus 3 months  Signed, Thurmond Hildebran Meredith Leeds, MD  02/08/2019 10:11 AM     [facility] [address] [place] [address] [phone] (office) [phone] (fax)

## 2019-02-13 ENCOUNTER — Encounter: Payer: Self-pay | Admitting: Family Medicine

## 2019-02-13 ENCOUNTER — Other Ambulatory Visit: Payer: Self-pay

## 2019-02-13 ENCOUNTER — Ambulatory Visit: Payer: Medicare PPO | Admitting: Family Medicine

## 2019-02-13 VITALS — BP 130/82 | HR 81 | Ht 75.0 in | Wt 241.0 lb

## 2019-02-13 DIAGNOSIS — M999 Biomechanical lesion, unspecified: Secondary | ICD-10-CM | POA: Diagnosis not present

## 2019-02-13 DIAGNOSIS — M545 Low back pain: Secondary | ICD-10-CM | POA: Diagnosis not present

## 2019-02-13 DIAGNOSIS — G8929 Other chronic pain: Secondary | ICD-10-CM | POA: Diagnosis not present

## 2019-02-13 NOTE — Patient Instructions (Signed)
See me again in 6 weeks 

## 2019-02-13 NOTE — Assessment & Plan Note (Signed)
Decision today to treat with OMT was based on Physical Exam  After verbal consent patient was treated with HVLA, ME, FPR techniques in , thoracic, lumbar and sacral areas  Patient tolerated the procedure well with improvement in symptoms  Patient given exercises, stretches and lifestyle modifications  See medications in patient instructions if given  Patient will follow up in 4-8 weeks 

## 2019-02-13 NOTE — Assessment & Plan Note (Signed)
Much better at the moment.  Patient has had an exacerbation of this chronic illness and did not cause patient to come in sooner than later.  We discussed with patient about icing regimen, home exercises, which activities to do which wants to avoid, discussed the importance of weight control as well as core stability.  Follow-up again in 6 weeks

## 2019-02-13 NOTE — Progress Notes (Signed)
[place] Phone: [phone] Subjective:   Clayton [name], am serving as a scribe for Dr. Hulan Saas. This visit occurred during the SARS-CoV-2 public health emergency.  Safety protocols were in place, including screening questions prior to the visit, additional usage of staff PPE, and extensive cleaning of exam room while observing appropriate contact time as indicated for disinfecting solutions.   I'm seeing this patient by the request  of:  Kuneff, Renee A, DO  CC: Back pain follow-up  [id number]  Clayton [name] is a 74 y.o. male coming in with complaint of back pain. Last seen on 12/04/2018 for OMT. Patient states that his back has been doing ok. Looking at cardiologists due to still being in asymptomatic a-fib. Is using eliquis and flecanide. Did have some confusion.  Patient states that when it comes to his pain or overall not doing really doing too bad.  Notices that as long as he can keep control over his weight he seems to do well.  Denies any type of chest discomfort.  Still has some good endurance when he does work out.     Past Medical History:  Diagnosis Date  . Arthritis    hips   . Atrial fibrillation ([place])   . Cataract   . Dysrhythmia    PAF- hx of 7 years ago   . HLD (hyperlipidemia)   . Hypertension 05/29/10   ECHO-EF 67% wnl  . Meniere disease   . Obesity   . Palpitations   . Sleep apnea 09/14/04   [facility]- Dr. Humphrey Rolls; CPAP titration 11/12/04- Dr. Humphrey Rolls   Past Surgical History:  Procedure Laterality Date  . CARDIAC CATHETERIZATION  11/03/05  . CARDIOVERSION N/A 09/26/2018   Procedure: CARDIOVERSION;  Surgeon: Geralynn Rile, MD;  Location: [facility];  Service: Endoscopy;  Laterality: N/A;  . CATARACT EXTRACTION  2014  . KNEE ARTHROSCOPY     left knee- 2002   . TOTAL HIP ARTHROPLASTY  06/08/2011   Procedure: TOTAL HIP ARTHROPLASTY ANTERIOR APPROACH;   Surgeon: [name], MD;  Location: [facility];  Service: Orthopedics;  Laterality: Left;   Social History   Socioeconomic History  . Marital status: Married    Spouse name: Kareem Einstein  . Number of children: Not on file  . Years of education: Not on file  . Highest education level: Not on file  Occupational History  . Occupation: Retired  Tobacco Use  . Smoking status: Never Smoker  . Smokeless tobacco: Never Used  Substance and Sexual Activity  . Alcohol use: [name]  . Drug use: [name]  . Sexual activity: Yes    Birth control/protection: None  Other Topics Concern  . Not on file  Social History Narrative   Married. [name].Takes care of his mother-in-law as well.    Retired, Conservator, museum/gallery.    Drinks caffeine beverages. Takes a daily vitamin.   Exercises routinely.    Smoke detector in the home.    Ambulates independently.    Social Determinants of Health   Financial Resource Strain:   . Difficulty of Paying Living Expenses: Not on file  Food Insecurity:   . Worried About Charity fundraiser in the Last Year: Not on file  . Ran Out of Food in the Last Year: Not on file  Transportation Needs:   . Lack of Transportation (Medical): Not on file  . Lack of Transportation (Non-Medical): Not  on file  Physical Activity:   . Days of Exercise per Week: Not on file  . Minutes of Exercise per Session: Not on file  Stress:   . Feeling of Stress : Not on file  Social Connections:   . Frequency of Communication with Friends and Family: Not on file  . Frequency of Social Gatherings with Friends and Family: Not on file  . Attends Religious Services: Not on file  . Active Member of Clubs or Organizations: Not on file  . Attends Archivist Meetings: Not on file  . Marital Status: Not on file   Allergies  Allergen Reactions  . Rosuvastatin     Other reaction(s): GI Upset (intolerance) Crestor - Intestinal issues   . Statins Other (See Comments)    Crestor -  Intestinal issues    Family History  Problem Relation Age of Onset  . Hypertension Mother   . Stroke Mother   . Diabetes Mother   . Hyperlipidemia Mother   . Obesity Mother   . Diabetes Sister   . Stroke Maternal Grandmother        65 died   . Heart disease Maternal Grandfather   . Hypertension Maternal Grandfather   . Diabetes Maternal Grandfather   . Arthritis Father   . Heart disease Father   . Heart disease Paternal Grandfather     Current Outpatient Medications (Endocrine & Metabolic):  .  metFORMIN (GLUCOPHAGE) 500 MG tablet, Take 1 tablet (500 mg total) by mouth daily with breakfast.  Current Outpatient Medications (Cardiovascular):  .  ezetimibe (ZETIA) 10 MG tablet, TAKE 1 TABLET BY MOUTH EVERY DAY .  flecainide (TAMBOCOR) 50 MG tablet, Take 1 tablet (50 mg total) by mouth 2 (two) times daily. Marland Kitchen  LIVALO 2 MG TABS, TAKE 1 TABLET BY MOUTH EVERY DAY .  metoprolol succinate (TOPROL-XL) 100 MG 24 hr tablet, Take 100 mg in the evenings (along with 25 mg or 50 mg in the mornings) .  metoprolol succinate (TOPROL-XL) 25 MG 24 hr tablet, TAKE 25 MG IN THE MORNING (ALONG WITH 100 MG IN THE EVENING) FOR 5 DAYS- THEN IF PULSE STILL IN 70'S INCREASE TO 50 MG IN THE MORNING (ALONG WITH 100 MG IN THE EVENING) .  olmesartan (BENICAR) 20 MG tablet, TAKE 1 TABLET BY MOUTH EVERY DAY .  spironolactone (ALDACTONE) 25 MG tablet, TAKE 1/2 TABLET BY MOUTH EVERY DAY .  diltiazem (CARDIZEM CD) 180 MG 24 hr capsule, Take 1 capsule (180 mg total) by mouth daily.   Current Outpatient Medications (Analgesics):  .  aspirin EC 81 MG tablet, Take 1 tablet (81 mg total) by mouth daily.  Current Outpatient Medications (Hematological):  Marland Kitchen  ELIQUIS 5 MG TABS tablet, TAKE 1 TABLET BY MOUTH TWICE A DAY  Current Outpatient Medications (Other):  Marland Kitchen  Ascorbic Acid (VITAMIN C) 1000 MG tablet, Take 1,000 mg by mouth daily.  .  Boswellia-Glucosamine-Vit D (GLUCOSAMINE COMPLEX PO), Take 3 tablets by mouth every  morning.  .  Coenzyme Q10 200 MG capsule, Take 200 mg by mouth daily.  .  Multiple Vitamin (MULITIVITAMIN WITH MINERALS) TABS, Take 1 tablet by mouth every evening.  [name] 3-6-9 Fatty Acids (OMEGA-3-6-9 PO), Take 3 capsules by mouth every morning. Omega 3= 800mg ,omega 6=380mg ,omega 9 =300mg  .  triamcinolone cream (KENALOG) 0.5 %, APPLY 1 APPLICATION TOPICALLY 2 (TWO) TIMES DAILY. TO AFFECTED AREAS. Marland Kitchen  zinc gluconate 50 MG tablet, Take 50 mg by mouth every evening.  Marland Kitchen  Zn-Pyg Afri-Nettle-Saw Palmet (SAW PALMETTO COMPLEX PO), Take 1 capsule by mouth every morning.    Reviewed prior external information including notes and imaging from  primary care provider As well as notes that were available from care everywhere and other healthcare systems.  Past medical history, social, surgical and family history all reviewed in electronic medical record.  [name] pertanent information unless stated regarding to the chief complaint.   Review of Systems:  [name] headache, visual changes, nausea, vomiting, diarrhea, constipation, dizziness, abdominal pain, skin rash, fevers, chills, night sweats, weight loss, swollen lymph nodes, body aches, joint swelling, chest pain, shortness of breath, mood changes. POSITIVE muscle aches  Objective  Blood pressure 130/82, pulse 81, height 6\' 3"  (1.905 m), weight 241 lb (109.3 kg), SpO2 98 %.   General: [name] apparent distress alert and oriented x3 mood and affect normal, dressed appropriately.  HEENT: Pupils equal, extraocular movements intact  Respiratory: Patient's speak in full sentences and does not appear short of breath  Cardiovascular: [name] lower extremity edema, non tender, [name] erythema  Skin: Warm dry intact with [name] signs of infection or rash on extremities or on axial skeleton.  Abdomen: Soft nontender  Neuro: Cranial nerves II through XII are intact, neurovascularly intact in all extremities with 2+ DTRs and 2+ pulses.  Lymph: [name] lymphadenopathy of posterior or  anterior cervical chain or axillae bilaterally.  Gait normal with good balance and coordination.  MSK:  tender with full range of motion and good stability and symmetric strength and tone of shoulders, elbows, wrist, hip, knee and ankles bilaterally.  Mild arthritic changes of multiple joints Back exam does have some mild tightness more than thoracolumbar in the lumbosacral areas.  Mild tightness of FABER test on the right compared to left.  Neck exam does have some mild loss of lordosis but negative Spurling's.  Does have some mild crepitus with range of motion.  Osteopathic findings  T7 extended rotated and side bent right inhaled rib L5 flexed rotated and side bent left  Sacrum right on right    Impression and Recommendations:     This case required medical decision making of moderate complexity. The above documentation has been reviewed and is accurate and complete Clayton Pulley, DO       Note: This dictation was prepared with Dragon dictation along with smaller phrase technology. Any transcriptional errors that result from this process are unintentional.

## 2019-02-21 ENCOUNTER — Other Ambulatory Visit: Payer: Self-pay

## 2019-02-21 ENCOUNTER — Encounter (INDEPENDENT_AMBULATORY_CARE_PROVIDER_SITE_OTHER): Payer: Self-pay | Admitting: Family Medicine

## 2019-02-21 ENCOUNTER — Telehealth (INDEPENDENT_AMBULATORY_CARE_PROVIDER_SITE_OTHER): Payer: Medicare PPO | Admitting: Family Medicine

## 2019-02-21 DIAGNOSIS — R7303 Prediabetes: Secondary | ICD-10-CM

## 2019-02-21 DIAGNOSIS — E669 Obesity, unspecified: Secondary | ICD-10-CM

## 2019-02-21 DIAGNOSIS — Z6830 Body mass index (BMI) 30.0-30.9, adult: Secondary | ICD-10-CM | POA: Diagnosis not present

## 2019-02-21 MED ORDER — METFORMIN HCL 500 MG PO TABS: 500.0000 mg | ORAL_TABLET | Freq: Every day | ORAL | 0 refills | Status: DC

## 2019-02-21 NOTE — Progress Notes (Signed)
TeleHealth Visit:  Due to the COVID-19 pandemic, this visit was completed with telemedicine (audio/video) technology to reduce patient and provider exposure as well as to preserve personal protective equipment.   Clayton [name] has verbally consented to this TeleHealth visit. The patient is located at home, the provider is located at the [facility] office. The participants in this visit include the listed provider and patient. Amanda was unable to use realtime audiovisual technology today and the telehealth visit was conducted via telephone.   Chief Complaint: OBESITY Clayton [name] is here to discuss his progress with his obesity treatment plan along with follow-up of his obesity related diagnoses. Clayton [name] is on following a lower carbohydrate, vegetable and lean protein rich diet plan and states he is following his eating plan approximately 85-90% of the time. Clayton [name] states he is walking for 45-50 minutes 7 times per week, and on Nordic track bike for 30-35 minutes 4 times per week.  Today's visit was #: 16 Starting weight: 266 lbs Starting date: 11/01/17  Interim History: Clayton [name] states he has maintained his weight since our last visit. He has been busy dealing with recurring aFib, but he has still done well.  Subjective:   1. Pre-diabetes Clayton [name]'s recent A1c was 5.8, and he continues to work on diet and exercise and denies nausea or vomiting.  Assessment/Plan:   1. Pre-diabetes Clayton [name] will continue to work on weight loss, diet, exercise, and decreasing simple carbohydrates to help decrease the risk of diabetes. Clayton [name] agreed to continue metformin as is, and we will refill metformin for 1 month.  - metFORMIN (GLUCOPHAGE) 500 MG tablet; Take 1 tablet (500 mg total) by mouth daily with breakfast.  Dispense: 30 tablet; Refill: 0  2. Class 1 obesity with serious comorbidity and body mass index (BMI) of 30.0 to 30.9 in adult, unspecified obesity type Clayton [name] is currently in the action  stage of change. As such, his goal is to continue with weight loss efforts. He has agreed to keeping a food journal and adhering to recommended goals of 1800 calories and 100 calories protein daily.   Exercise goals: Clayton [name] is to continue his current exercise regimen as is.  Behavioral modification strategies: planning for success.  Clayton [name] has agreed to follow-up with our clinic in 3 weeks with myself. He was informed of the importance of frequent follow-up visits to maximize his success with intensive lifestyle modifications for his multiple health conditions.  Objective:   VITALS: Per patient if applicable, see vitals. GENERAL: Alert and in no acute distress. CARDIOPULMONARY: No increased WOB. Speaking in clear sentences.  PSYCH: Pleasant and cooperative. Speech normal rate and rhythm. Affect is appropriate. Insight and judgement are appropriate. Attention is focused, linear, and appropriate.  NEURO: Oriented as arrived to appointment on time with no prompting.   Lab Results  Component Value Date   CREATININE 1.31 (H) 01/09/2019   BUN 30 (H) 01/09/2019   NA 142 01/09/2019   K 4.8 01/09/2019   CL 106 01/09/2019   CO2 21 01/09/2019   Lab Results  Component Value Date   ALT 31 01/09/2019   AST 30 01/09/2019   ALKPHOS 64 01/09/2019   BILITOT 0.8 01/09/2019   Lab Results  Component Value Date   HGBA1C 5.8 (H) 09/04/2018   HGBA1C 6.0 (H) 11/01/2017   HGBA1C 5.4 04/14/2016   HGBA1C 6.0 06/09/2015   Lab Results  Component Value Date   INSULIN 9.0 09/04/2018   INSULIN 22.4 11/01/2017   Lab Results  Component Value Date   TSH 1.290 08/09/2018   Lab Results  Component Value Date   CHOL 114 01/05/2019   HDL 57 01/05/2019   LDLCALC 47 01/05/2019   TRIG 39 01/05/2019   CHOLHDL 2.0 01/05/2019   Lab Results  Component Value Date   WBC 6.1 09/22/2018   HGB 15.4 09/22/2018   HCT 46.2 09/22/2018   MCV 95 09/22/2018   PLT 187 09/22/2018   No results found for: IRON,  TIBC, FERRITIN  Attestation Statements:   Reviewed by clinician on day of visit: allergies, medications, problem list, medical history, surgical history, family history, social history, and previous encounter notes.  Time spent on visit including pre-visit chart review and post-visit care was 20 minutes.    I, Trixie Dredge, am acting as transcriptionist for Dennard Nip, MD.  I have reviewed the above documentation for accuracy and completeness, and I agree with the above. - Dennard Nip, MD

## 2019-02-22 ENCOUNTER — Other Ambulatory Visit (INDEPENDENT_AMBULATORY_CARE_PROVIDER_SITE_OTHER): Payer: Self-pay | Admitting: Family Medicine

## 2019-02-22 DIAGNOSIS — R7303 Prediabetes: Secondary | ICD-10-CM

## 2019-02-27 ENCOUNTER — Other Ambulatory Visit: Payer: Self-pay

## 2019-02-27 ENCOUNTER — Encounter: Payer: Self-pay | Admitting: Neurology

## 2019-02-27 ENCOUNTER — Ambulatory Visit: Payer: Medicare PPO | Admitting: Neurology

## 2019-02-27 VITALS — BP 160/103 | HR 76 | Temp 97.4°F | Ht 74.0 in | Wt 238.2 lb

## 2019-02-27 DIAGNOSIS — G3184 Mild cognitive impairment, so stated: Secondary | ICD-10-CM

## 2019-02-27 DIAGNOSIS — R488 Other symbolic dysfunctions: Secondary | ICD-10-CM

## 2019-02-27 MED ORDER — PREVAGEN 10 MG PO CAPS: 1.0000 | ORAL_CAPSULE | Freq: Every morning | ORAL | 1 refills | Status: DC

## 2019-02-27 NOTE — Progress Notes (Signed)
[facility] [address] [place]. [place] [address] [phone]       OFFICE CONSULT NOTE  [name] Date of Birth:  1945/05/27 Medical Record Number:  [id number]   Referring MD: Ellouise Newer Reason for Referral: TIA HPI: Clayton [name] is a pleasant 74 year old Caucasian male seen today for initial office consultation visit for episode of memory loss possible TIA.  History is obtained from the patient, review of referral notes, electronic medical records.  No current brain imaging studies available.  He has a past medical history of atrial fibrillation, hypertension, Mnire's disease, palpitations, sleep apnea and arthritis.  He states in Sunday first week of Jan he had a usual day in the evening felt that for short period of time his brain was out of focus and he could not think right.  He thought about his cardiologist Dr. Claiborne Billings home is met multiple times but he could not remember his name.  He was however able to speak to his wife and had no trouble communicating and did not have any slurred speech or word finding difficulties.  The patient's eventually felt that his brain cleared and he could think clearly and remember Dr. Evette Georges name about 15 minutes later.  Patient had recently started taking flecainide and thought perhaps this may be a side effect from the medicine.  He describes this to Dr. Claiborne Billings when he saw him for follow-up and Dr. Claiborne Billings was concerned about a TIA.  Patient has a history of atrial fibrillation which she was initially diagnosed in 2007 and he underwent cardioversion and remained in sinus rhythm for 13 years.  Following a PTSD relapse in June of last year his A. fib came back and again responded to cardioversion but this time he stayed in sinus rhythm only for 13 weeks and was back in A. fib in December 2020.  He has since seen Dr. Curt Bears electrophysiologist who plans to do electrical ablation.  Patient had an echocardiogram done on 02/01/2019 which  was normal.  LDL cholesterol on 01/05/2019 was 47 and hemoglobin A1c on 09/04/2018 was 5.8.  He denies any headache, slurred speech, extremity weakness, numbness, gait balance problems.  He has no prior history of strokes TIAs seizures or significant neurological problems.  He is independent in activities of daily living and handles all his affairs.  He has no family stable exam is.  He has no history of major head injury with loss of consciousness or seizures.  ROS:   14 system review of systems is positive for memory difficulties, word finding difficulties, difficulty remembering names, atrial fibrillation and all other systems negative PMH:  Past Medical History:  Diagnosis Date  . Arthritis    hips   . Atrial fibrillation ([place])   . Cataract   . CPAP (continuous positive airway pressure) dependence   . Dysrhythmia    PAF- hx of 7 years ago   . HLD (hyperlipidemia)   . Hypertension 05/29/10   ECHO-EF 67% wnl  . Meniere disease   . Obesity   . Palpitations   . Sleep apnea 09/14/04   [facility]- Dr. Humphrey Rolls; CPAP titration 11/12/04- Dr. Humphrey Rolls  . Stroke [facility])     Social History:  Social History   Socioeconomic History  . Marital status: Married    Spouse name: Wanya Bangura  . Number of children: Not on file  . Years of education: Not on file  . Highest education level: Not on file  Occupational History  .  Occupation: Retired  Tobacco Use  . Smoking status: Never Smoker  . Smokeless tobacco: Never Used  Substance and Sexual Activity  . Alcohol use: No  . Drug use: No  . Sexual activity: Yes    Birth control/protection: None  Other Topics Concern  . Not on file  Social History Narrative   Married. [name].Takes care of his mother-in-law as well.    Retired, Conservator, museum/gallery.    Drinks caffeine beverages. Takes a daily vitamin.   Exercises routinely.    Smoke detector in the home.    Ambulates independently.    Social Determinants of Health    Financial Resource Strain:   . Difficulty of Paying Living Expenses: Not on file  Food Insecurity:   . Worried About Charity fundraiser in the Last Year: Not on file  . Ran Out of Food in the Last Year: Not on file  Transportation Needs:   . Lack of Transportation (Medical): Not on file  . Lack of Transportation (Non-Medical): Not on file  Physical Activity:   . Days of Exercise per Week: Not on file  . Minutes of Exercise per Session: Not on file  Stress:   . Feeling of Stress : Not on file  Social Connections:   . Frequency of Communication with Friends and Family: Not on file  . Frequency of Social Gatherings with Friends and Family: Not on file  . Attends Religious Services: Not on file  . Active Member of Clubs or Organizations: Not on file  . Attends Archivist Meetings: Not on file  . Marital Status: Not on file  Intimate Partner Violence:   . Fear of Current or Ex-Partner: Not on file  . Emotionally Abused: Not on file  . Physically Abused: Not on file  . Sexually Abused: Not on file    Medications:   Current Outpatient Medications on File Prior to Visit  Medication Sig Dispense Refill  . Ascorbic Acid (VITAMIN C) 1000 MG tablet Take 1,000 mg by mouth daily.     Marland Kitchen aspirin EC 81 MG tablet Take 1 tablet (81 mg total) by mouth daily. 90 tablet 3  . Boswellia-Glucosamine-Vit D (GLUCOSAMINE COMPLEX PO) Take 3 tablets by mouth every morning.     . Coenzyme Q10 200 MG capsule Take 200 mg by mouth daily.     Marland Kitchen ELIQUIS 5 MG TABS tablet TAKE 1 TABLET BY MOUTH TWICE A DAY 180 tablet 2  . ezetimibe (ZETIA) 10 MG tablet TAKE 1 TABLET BY MOUTH EVERY DAY 90 tablet 3  . flecainide (TAMBOCOR) 50 MG tablet Take 1 tablet (50 mg total) by mouth 2 (two) times daily. 180 tablet 3  . LIVALO 2 MG TABS TAKE 1 TABLET BY MOUTH EVERY DAY 90 tablet 3  . metFORMIN (GLUCOPHAGE) 500 MG tablet Take 1 tablet (500 mg total) by mouth daily with breakfast. 30 tablet 0  . metoprolol succinate  (TOPROL-XL) 100 MG 24 hr tablet Take 100 mg in the evenings (along with 25 mg or 50 mg in the mornings) 90 tablet 3  . metoprolol succinate (TOPROL-XL) 25 MG 24 hr tablet TAKE 25 MG IN THE MORNING (ALONG WITH 100 MG IN THE EVENING) FOR 5 DAYS- THEN IF PULSE STILL IN 70'S INCREASE TO 50 MG IN THE MORNING (ALONG WITH 100 MG IN THE EVENING) 180 tablet 1  . Multiple Vitamin (MULITIVITAMIN WITH MINERALS) TABS Take 1 tablet by mouth every evening.     . olmesartan (BENICAR) 20  MG tablet TAKE 1 TABLET BY MOUTH EVERY DAY 90 tablet 2  . Omega 3-6-9 Fatty Acids (OMEGA-3-6-9 PO) Take 3 capsules by mouth every morning. Omega 3= '800mg'$ ,omega 6='380mg'$ ,omega 9 ='300mg'$     . spironolactone (ALDACTONE) 25 MG tablet TAKE 1/2 TABLET BY MOUTH EVERY DAY 45 tablet 3  . triamcinolone cream (KENALOG) 0.5 % APPLY 1 APPLICATION TOPICALLY 2 (TWO) TIMES DAILY. TO AFFECTED AREAS. 30 g 3  . zinc gluconate 50 MG tablet Take 50 mg by mouth every evening.     Marland Kitchen Zn-Pyg Afri-Nettle-Saw Palmet (SAW PALMETTO COMPLEX PO) Take 1 capsule by mouth every morning.     . diltiazem (CARDIZEM CD) 180 MG 24 hr capsule Take 1 capsule (180 mg total) by mouth daily. 90 capsule 3   No current facility-administered medications on file prior to visit.    Allergies:   Allergies  Allergen Reactions  . Rosuvastatin     Other reaction(s): GI Upset (intolerance) Crestor - Intestinal issues   . Statins Other (See Comments)    Crestor - Intestinal issues     Physical Exam General: well developed, well nourished pleasant elderly Caucasian male, seated, in no evident distress Head: head normocephalic and atraumatic.   Neck: supple with no carotid or supraclavicular bruits Cardiovascular: regular rate and rhythm, no murmurs Musculoskeletal: no deformity Skin:  no rash/petichiae Vascular:  Normal pulses all extremities  Neurologic Exam Mental Status: Awake and fully alert. Oriented to place and time. Recent and remote memory intact. Attention span,  concentration and fund of knowledge appropriate. Mood and affect appropriate.  Diminished recall 2/3.  Able to name 12 animals which can walk on 4 legs.  Clock drawing score 4/4. Cranial Nerves: Fundoscopic exam reveals sharp disc margins. Pupils equal, briskly reactive to light. Extraocular movements full without nystagmus. Visual fields full to confrontation. Hearing intact. Facial sensation intact. Face, tongue, palate moves normally and symmetrically.  Motor: Normal bulk and tone. Normal strength in all tested extremity muscles. Sensory.: intact to touch , pinprick , position and vibratory sensation.  Coordination: Rapid alternating movements normal in all extremities. Finger-to-nose and heel-to-shin performed accurately bilaterally. Gait and Station: Arises from chair without difficulty. Stance is normal. Gait demonstrates normal stride length and balance . Able to heel, toe and tandem walk without difficulty.  Reflexes: 1+ and symmetric. Toes downgoing.   NIHSS 0 Modified Rankin 1   ASSESSMENT: 73 year old Caucasian male with transient episode of memory and word finding difficulty of unclear etiology possibly age-appropriate mild cognitive impairment.     PLAN: I had a long discussion with the patient regarding his transient episode of word finding difficulty and anomia likely representing mild cognitive impairment rather than a TIA or seizure.  I recommend he increase participation in cognitively challenging activities like solving crossword puzzles, playing bridge and sudoku.  He may try taking provision 10 mg capsule daily as well.  We also discussed memory compensation strategies.  He will continue on Eliquis for his atrial fibrillation for stroke prevention and maintain strict control of hypertension with blood pressure goal below 130/90, lipids with LDL cholesterol goal below 70 mg percent.  No further neurological diagnostic testing is indicated unless his symptoms recur or get worse.   Greater than 50% time during this 50-minute consultation visit was spent in counseling and coordination of care about his memory difficulties and mild cognitive impairment and answering questions he will return for follow-up in the future only as necessary Antony Contras, MD Note: This document was prepared with  digital dictation and possible smart Company secretary. Any transcriptional errors that result from this process are unintentional.

## 2019-02-27 NOTE — Patient Instructions (Signed)
I had a long discussion with the patient regarding his transient episode of word finding difficulty and anomia likely representing mild cognitive impairment rather than a TIA or seizure.  I recommend he increase participation in cognitively challenging activities like solving crossword puzzles, playing bridge and sudoku.  He may try taking provision 10 mg capsule daily as well.  We also discussed memory compensation strategies.  He will continue on Eliquis for his atrial fibrillation for stroke prevention and maintain strict control of hypertension with blood pressure goal below 130/90, lipids with LDL cholesterol goal below 70 mg percent.  No further neurological diagnostic testing is indicated unless his symptoms recur or get worse.  He will return for follow-up in the future only as necessary Memory Compensation Strategies  1. Use "WARM" strategy.  W= write it down  A= associate it  R= repeat it  M= make a mental note  2.   You can keep a Social worker.  Use a 3-ring notebook with sections for the following: calendar, important names and phone numbers,  medications, doctors' names/phone numbers, lists/reminders, and a section to journal what you did  each day.   3.    Use a calendar to write appointments down.  4.    Write yourself a schedule for the day.  This can be placed on the calendar or in a separate section of the Memory Notebook.  Keeping a  regular schedule can help memory.  5.    Use medication organizer with sections for each day or morning/evening pills.  You may need help loading it  6.    Keep a basket, or pegboard by the door.  Place items that you need to take out with you in the basket or on the pegboard.  You may also want to  include a message board for reminders.  7.    Use sticky notes.  Place sticky notes with reminders in a place where the task is performed.  For example: " turn off the  stove" placed by the stove, "lock the door" placed on the door at eye level,  " take your medications" on  the bathroom mirror or by the place where you normally take your medications.  8.    Use alarms/timers.  Use while cooking to remind yourself to check on food or as a reminder to take your medicine, or as a  reminder to make a call, or as a reminder to perform another task, etc.

## 2019-02-28 DIAGNOSIS — N401 Enlarged prostate with lower urinary tract symptoms: Secondary | ICD-10-CM | POA: Diagnosis not present

## 2019-02-28 DIAGNOSIS — R3915 Urgency of urination: Secondary | ICD-10-CM | POA: Diagnosis not present

## 2019-02-28 DIAGNOSIS — R310 Gross hematuria: Secondary | ICD-10-CM | POA: Diagnosis not present

## 2019-02-28 DIAGNOSIS — N410 Acute prostatitis: Secondary | ICD-10-CM | POA: Diagnosis not present

## 2019-03-05 ENCOUNTER — Ambulatory Visit: Payer: Medicare PPO | Admitting: Cardiology

## 2019-03-05 ENCOUNTER — Other Ambulatory Visit: Payer: Self-pay

## 2019-03-05 ENCOUNTER — Encounter: Payer: Self-pay | Admitting: Cardiology

## 2019-03-05 VITALS — BP 164/84 | HR 86 | Ht 74.0 in | Wt 234.0 lb

## 2019-03-05 DIAGNOSIS — I4819 Other persistent atrial fibrillation: Secondary | ICD-10-CM

## 2019-03-05 DIAGNOSIS — Z01812 Encounter for preprocedural laboratory examination: Secondary | ICD-10-CM

## 2019-03-05 LAB — BASIC METABOLIC PANEL
BUN/Creatinine Ratio: 16 (ref 10–24)
BUN: 22 mg/dL (ref 8–27)
CO2: 23 mmol/L (ref 20–29)
Calcium: 10.1 mg/dL (ref 8.6–10.2)
Chloride: 102 mmol/L (ref 96–106)
Creatinine, Ser: 1.35 mg/dL — ABNORMAL HIGH (ref 0.76–1.27)
GFR calc Af Amer: 60 mL/min/{1.73_m2} (ref >59)
GFR calc non Af Amer: 52 mL/min/{1.73_m2} — ABNORMAL LOW (ref >59)
Glucose: 120 mg/dL — ABNORMAL HIGH (ref 65–99)
Potassium: 4.8 mmol/L (ref 3.5–5.2)
Sodium: 138 mmol/L (ref 134–144)

## 2019-03-05 LAB — CBC
Hematocrit: 45.3 % (ref 37.5–51.0)
Hemoglobin: 15.4 g/dL (ref 13.0–17.7)
MCH: 30.7 pg (ref 26.6–33.0)
MCHC: 34 g/dL (ref 31.5–35.7)
MCV: 90 fL (ref 79–97)
Platelets: 168 10*3/uL (ref 150–450)
RBC: 5.01 x10E6/uL (ref 4.14–5.80)
RDW: 11.9 % (ref 11.6–15.4)
WBC: 4.8 10*3/uL (ref 3.4–10.8)

## 2019-03-05 MED ORDER — FLECAINIDE ACETATE 100 MG PO TABS: 100.0000 mg | ORAL_TABLET | Freq: Two times a day (BID) | ORAL | 6 refills | Status: DC

## 2019-03-05 NOTE — H&P (View-Only) (Signed)
Electrophysiology Office Note   Date:  03/05/2019   ID:  Clayton [name], DOB 05-01-1945, MRN [id number]  PCP:  Ma Hillock, DO  Cardiologist:  Claiborne Billings Primary Electrophysiologist:  Zyler Hyson Meredith Leeds, MD    Chief Complaint: AF   History of Present Illness: Clayton [name] is a 74 y.o. male who is being seen today for the evaluation of AF at the request of Kuneff, Renee A, DO. Presenting today for electrophysiology evaluation.  He has a history significant for atrial fibrillation, hypertension, sleep apnea.  He is currently on flecainide.  Unfortunately he has had some fatigue since flecainide was started.  A few weeks ago, he had an episode of disorientation and anxiousness, with possibly some transient aphasia.  He denied focal weakness.  His symptoms resolved after 30 minutes.  He was seen by neurology who felt this was mild cognitive impairment rather than a TIA or seizure.  He was taught memory activities such as crossword puzzles and sudoku.  Today, denies symptoms of palpitations, chest pain, shortness of breath, orthopnea, PND, lower extremity edema, claudication, dizziness, presyncope, syncope, bleeding, or neurologic sequela. The patient is tolerating medications without difficulties.  Unfortunately, this past weekend his wife had a fall with a displaced left humerus fracture.  She was in quite a bit of pain this weekend.  He is is quite stressed.  He does want to get back into normal rhythm and would have chosen ablation, but with his wife's fall, he would prefer to have cardioversion and see how he does.   Past Medical History:  Diagnosis Date  . Arthritis    hips   . Atrial fibrillation ([place])   . Cataract   . CPAP (continuous positive airway pressure) dependence   . Dysrhythmia    PAF- hx of 7 years ago   . HLD (hyperlipidemia)   . Hypertension 05/29/10   ECHO-EF 67% wnl  . Meniere disease   . Obesity   . Palpitations   . Sleep apnea 09/14/04   [facility]- Dr. Humphrey [name]; CPAP titration 11/12/04- Dr. Humphrey [name]  . Stroke [facility])    Past Surgical History:  Procedure Laterality Date  . CARDIAC CATHETERIZATION  11/03/05  . CARDIOVERSION N/A 09/26/2018   Procedure: CARDIOVERSION;  Surgeon: Geralynn Rile, MD;  Location: [facility];  Service: Endoscopy;  Laterality: N/A;  . CATARACT EXTRACTION  2014  . KNEE ARTHROSCOPY     left knee- 2002   . TOTAL HIP ARTHROPLASTY  06/08/2011   Procedure: TOTAL HIP ARTHROPLASTY ANTERIOR APPROACH;  Surgeon: [name], MD;  Location: [facility];  Service: Orthopedics;  Laterality: Left;     Current Outpatient Medications  Medication Sig Dispense Refill  . Apoaequorin (PREVAGEN) 10 MG CAPS Take 1 capsule by mouth every morning. 1 capsule 1  . Ascorbic Acid (VITAMIN C) 1000 MG tablet Take 1,000 mg by mouth daily.     Marland Kitchen aspirin EC 81 MG tablet Take 1 tablet (81 mg total) by mouth daily. 90 tablet 3  . [place]well[place]-Glucosamine-Vit D (GLUCOSAMINE COMPLEX PO) Take 3 tablets by mouth every morning.     . Coenzyme Q10 200 MG capsule Take 200 mg by mouth daily.     Marland Kitchen ELIQUIS 5 MG TABS tablet TAKE 1 TABLET BY MOUTH TWICE A DAY 180 tablet 2  . ezetimibe (ZETIA) 10 MG tablet TAKE 1 TABLET BY MOUTH EVERY DAY 90 tablet 3  . flecainide (TAMBOCOR) 50 MG tablet Take 1 tablet (50 mg  total) by mouth 2 (two) times daily. 180 tablet 3  . LIVALO 2 MG TABS TAKE 1 TABLET BY MOUTH EVERY DAY 90 tablet 3  . metFORMIN (GLUCOPHAGE) 500 MG tablet Take 1 tablet (500 mg total) by mouth daily with breakfast. 30 tablet 0  . metoprolol succinate (TOPROL-XL) 100 MG 24 hr tablet Take 100 mg in the evenings (along with 25 mg or 50 mg in the mornings) 90 tablet 3  . metoprolol succinate (TOPROL-XL) 25 MG 24 hr tablet TAKE 25 MG IN THE MORNING (ALONG WITH 100 MG IN THE EVENING) FOR 5 DAYS- THEN IF PULSE STILL IN 70'S INCREASE TO 50 MG IN THE MORNING (ALONG WITH 100 MG IN THE EVENING) 180 tablet 1  . Multiple Vitamin (MULITIVITAMIN WITH  MINERALS) TABS Take 1 tablet by mouth every evening.     . olmesartan (BENICAR) 20 MG tablet TAKE 1 TABLET BY MOUTH EVERY DAY 90 tablet 2  . Omega 3-6-9 Fatty Acids (OMEGA-3-6-9 PO) Take 3 capsules by mouth every morning. Omega 3= 800mg ,omega 6=380mg ,omega 9 =300mg     . spironolactone (ALDACTONE) 25 MG tablet TAKE 1/2 TABLET BY MOUTH EVERY DAY 45 tablet 3  . tamsulosin (FLOMAX) 0.4 MG CAPS capsule Take 0.4 mg by mouth daily.    Marland Kitchen triamcinolone cream (KENALOG) 0.5 % APPLY 1 APPLICATION TOPICALLY 2 (TWO) TIMES DAILY. TO AFFECTED AREAS. 30 g 3  . zinc gluconate 50 MG tablet Take 50 mg by mouth every evening.     Marland Kitchen Zn-Pyg Afri-Nettle-Saw Palmet (SAW PALMETTO COMPLEX PO) Take 1 capsule by mouth every morning.     . diltiazem (CARDIZEM CD) 180 MG 24 hr capsule Take 1 capsule (180 mg total) by mouth daily. 90 capsule 3   No current facility-administered medications for this visit.    Allergies:   Rosuvastatin and Statins   Social History:  The patient  reports that he has never smoked. He has never used smokeless tobacco. He reports that he does not drink alcohol or use drugs.   Family History:  The patient's family history includes Arthritis in his father; Diabetes in his maternal grandfather, mother, and sister; Heart disease in his father, maternal grandfather, and paternal grandfather; Hyperlipidemia in his mother; Hypertension in his maternal grandfather and mother; Obesity in his mother; Stroke in his maternal grandmother and mother.    ROS:  Please see the history of present illness.   Otherwise, review of systems is positive for none.   All other systems are reviewed and negative.   PHYSICAL EXAM: VS:  BP (!) 164/84   Pulse 86   Ht 6\' 2"  (1.88 m)   Wt 234 lb (106.1 kg)   BMI 30.04 kg/m  , BMI Body mass index is 30.04 kg/m. GEN: Well nourished, well developed, in no acute distress  HEENT: normal  Neck: no JVD, carotid bruits, or masses Cardiac: Irregular; no murmurs, rubs, or  gallops,no edema  Respiratory:  clear to auscultation bilaterally, normal work of breathing GI: soft, nontender, nondistended, + BS MS: no deformity or atrophy  Skin: warm and dry Neuro:  Strength and sensation are intact Psych: euthymic mood, full affect  EKG:  EKG is not ordered today. Personal review of the ekg ordered 01/24/19 shows atrial fibrillation, rate 83  Recent Labs: 08/09/2018: TSH 1.290 09/22/2018: Hemoglobin 15.4; Platelets 187 01/09/2019: ALT 31; BUN 30; Creatinine, Ser 1.31; Magnesium 2.3; Potassium 4.8; Sodium 142    Lipid Panel     Component Value Date/Time   CHOL 114  01/05/2019 1003   TRIG 39 01/05/2019 1003   HDL 57 01/05/2019 1003   CHOLHDL 2.0 01/05/2019 1003   CHOLHDL 3.8 10/12/2016 0902   VLDL 17 04/14/2016 0937   LDLCALC 47 01/05/2019 1003   LDLCALC 119 (H) 10/12/2016 0902     Wt Readings from Last 3 Encounters:  03/05/19 234 lb (106.1 kg)  02/27/19 238 lb 3.2 oz (108 kg)  02/13/19 241 lb (109.3 kg)      Other studies Reviewed: Additional studies/ records that were reviewed today include: TTE 02/01/2019 Review of the above records today demonstrates:   1. Left ventricular ejection fraction, by visual estimation, is 50 to 55%. The left ventricle has low normal function. There is moderately increased left ventricular hypertrophy.  2. Left ventricular diastolic function could not be evaluated.  3. Mildly dilated left ventricular internal cavity size.  4. The left ventricle has no regional wall motion abnormalities.  5. Global right ventricle has normal systolic function.The right ventricular size is normal. No increase in right ventricular wall thickness.  6. Left atrial size was mildly dilated.  7. Right atrial size was normal.  8. The mitral valve is normal in structure. Trivial mitral valve regurgitation.  9. The tricuspid valve is normal in structure. 10. The aortic valve is tricuspid. Aortic valve regurgitation is not visualized. No evidence of  aortic valve sclerosis or stenosis. 11. The pulmonic valve was grossly normal. Pulmonic valve regurgitation is not visualized. 12. Aortic dilatation noted. 13. There is mild dilatation of the ascending aorta measuring 39 mm. 14. The inferior vena cava is normal in size with greater than 50% respiratory variability, suggesting right atrial pressure of 3 mmHg.   ASSESSMENT AND PLAN:  1.  Persistent atrial fibrillation: CHA2DS2-VASc of at least 2 on flecainide, metoprolol, and Eliquis.  Spoke with him at length about either increasing flecainide and cardioversion versus ablation.  At this point, due to his wife's fall, he would prefer to increase flecainide and opt for cardioversion.  We Shaniyah Wix arrange this and I Anysia Choi see him back in 6 months for further discussions.  2.  Hypertension: Elevated today but is usually well controlled when he is in sinus rhythm.  He also has quite a bit of personal stress.  He Jadin Kagel recheck his blood pressure after cardioversion.  3.  OSA: CPAP compliance encouraged   Current medicines are reviewed at length with the patient today.   The patient does not have concerns regarding his medicines.  The following changes were made today:  none  Labs/ tests ordered today include:  Orders Placed This Encounter  Procedures  . Basic metabolic panel  . CBC     Disposition:   FU with Filiberto Wamble 6 months  Signed, Berklie Dethlefs Meredith Leeds, MD  03/05/2019 10:21 AM     [facility] [address] [place] [address] [phone] (office) [phone] (fax)

## 2019-03-05 NOTE — Progress Notes (Signed)
Electrophysiology Office Note   Date:  03/05/2019   ID:  Clayton [name], DOB 08-09-1945, MRN [id number]  PCP:  Ma Hillock, DO  Cardiologist:  Claiborne Billings Primary Electrophysiologist:  Alyssamae Klinck Meredith Leeds, MD    Chief Complaint: AF   History of Present Illness: Clayton [name] is a 74 y.o. male who is being seen today for the evaluation of AF at the request of Kuneff, Renee A, DO. Presenting today for electrophysiology evaluation.  He has a history significant for atrial fibrillation, hypertension, sleep apnea.  He is currently on flecainide.  Unfortunately he has had some fatigue since flecainide was started.  A few weeks ago, he had an episode of disorientation and anxiousness, with possibly some transient aphasia.  He denied focal weakness.  His symptoms resolved after 30 minutes.  He was seen by neurology who felt this was mild cognitive impairment rather than a TIA or seizure.  He was taught memory activities such as crossword puzzles and sudoku.  Today, denies symptoms of palpitations, chest pain, shortness of breath, orthopnea, PND, lower extremity edema, claudication, dizziness, presyncope, syncope, bleeding, or neurologic sequela. The patient is tolerating medications without difficulties.  Unfortunately, this past weekend his wife had a fall with a displaced left humerus fracture.  She was in quite a bit of pain this weekend.  He is is quite stressed.  He does want to get back into normal rhythm and would have chosen ablation, but with his wife's fall, he would prefer to have cardioversion and see how he does.   Past Medical History:  Diagnosis Date  . Arthritis    hips   . Atrial fibrillation ([place])   . Cataract   . CPAP (continuous positive airway pressure) dependence   . Dysrhythmia    PAF- hx of 7 years ago   . HLD (hyperlipidemia)   . Hypertension 05/29/10   ECHO-EF 67% wnl  . Meniere disease   . Obesity   . Palpitations   . Sleep apnea 09/14/04   [place]  [facility]- Dr. Humphrey Rolls; CPAP titration 11/12/04- Dr. Humphrey Rolls  . Stroke [facility])    Past Surgical History:  Procedure Laterality Date  . CARDIAC CATHETERIZATION  11/03/05  . CARDIOVERSION N/A 09/26/2018   Procedure: CARDIOVERSION;  Surgeon: Geralynn Rile, MD;  Location: [facility];  Service: Endoscopy;  Laterality: N/A;  . CATARACT EXTRACTION  2014  . KNEE ARTHROSCOPY     left knee- 2002   . TOTAL HIP ARTHROPLASTY  06/08/2011   Procedure: TOTAL HIP ARTHROPLASTY ANTERIOR APPROACH;  Surgeon: [name], MD;  [facility];  Service: Orthopedics;  Laterality: Left;     Current Outpatient Medications  Medication Sig Dispense Refill  . Apoaequorin (PREVAGEN) 10 MG CAPS Take 1 capsule by mouth every morning. 1 capsule 1  . Ascorbic Acid (VITAMIN C) 1000 MG tablet Take 1,000 mg by mouth daily.     Marland Kitchen aspirin EC 81 MG tablet Take 1 tablet (81 mg total) by mouth daily. 90 tablet 3  . [place]-Glucosamine-Vit D (GLUCOSAMINE COMPLEX PO) Take 3 tablets by mouth every morning.     . Coenzyme Q10 200 MG capsule Take 200 mg by mouth daily.     Marland Kitchen ELIQUIS 5 MG TABS tablet TAKE 1 TABLET BY MOUTH TWICE A DAY 180 tablet 2  . ezetimibe (ZETIA) 10 MG tablet TAKE 1 TABLET BY MOUTH EVERY DAY 90 tablet 3  . flecainide (TAMBOCOR) 50 MG tablet Take 1 tablet (50 mg  total) by mouth 2 (two) times daily. 180 tablet 3  . LIVALO 2 MG TABS TAKE 1 TABLET BY MOUTH EVERY DAY 90 tablet 3  . metFORMIN (GLUCOPHAGE) 500 MG tablet Take 1 tablet (500 mg total) by mouth daily with breakfast. 30 tablet 0  . metoprolol succinate (TOPROL-XL) 100 MG 24 hr tablet Take 100 mg in the evenings (along with 25 mg or 50 mg in the mornings) 90 tablet 3  . metoprolol succinate (TOPROL-XL) 25 MG 24 hr tablet TAKE 25 MG IN THE MORNING (ALONG WITH 100 MG IN THE EVENING) FOR 5 DAYS- THEN IF PULSE STILL IN 70'S INCREASE TO 50 MG IN THE MORNING (ALONG WITH 100 MG IN THE EVENING) 180 tablet 1  . Multiple Vitamin (MULITIVITAMIN WITH  MINERALS) TABS Take 1 tablet by mouth every evening.     . olmesartan (BENICAR) 20 MG tablet TAKE 1 TABLET BY MOUTH EVERY DAY 90 tablet 2  . Omega 3-6-9 Fatty Acids (OMEGA-3-6-9 PO) Take 3 capsules by mouth every morning. Omega 3= 800mg ,omega 6=380mg ,omega 9 =300mg     . spironolactone (ALDACTONE) 25 MG tablet TAKE 1/2 TABLET BY MOUTH EVERY DAY 45 tablet 3  . tamsulosin (FLOMAX) 0.4 MG CAPS capsule Take 0.4 mg by mouth daily.    Marland Kitchen triamcinolone cream (KENALOG) 0.5 % APPLY 1 APPLICATION TOPICALLY 2 (TWO) TIMES DAILY. TO AFFECTED AREAS. 30 g 3  . zinc gluconate 50 MG tablet Take 50 mg by mouth every evening.     Marland Kitchen Zn-Pyg Afri-Nettle-Saw Palmet (SAW PALMETTO COMPLEX PO) Take 1 capsule by mouth every morning.     . diltiazem (CARDIZEM CD) 180 MG 24 hr capsule Take 1 capsule (180 mg total) by mouth daily. 90 capsule 3   No current facility-administered medications for this visit.    Allergies:   Rosuvastatin and Statins   Social History:  The patient  reports that he has never smoked. He has never used smokeless tobacco. He reports that he does not drink alcohol or use drugs.   Family History:  The patient's family history includes Arthritis in his father; Diabetes in his maternal grandfather, mother, and sister; Heart disease in his father, maternal grandfather, and paternal grandfather; Hyperlipidemia in his mother; Hypertension in his maternal grandfather and mother; Obesity in his mother; Stroke in his maternal grandmother and mother.    ROS:  Please see the history of present illness.   Otherwise, review of systems is positive for none.   All other systems are reviewed and negative.   PHYSICAL EXAM: VS:  BP (!) 164/84   Pulse 86   Ht 6\' 2"  (1.88 m)   Wt 234 lb (106.1 kg)   BMI 30.04 kg/m  , BMI Body mass index is 30.04 kg/m. GEN: Well nourished, well developed, in no acute distress  HEENT: normal  Neck: no JVD, carotid bruits, or masses Cardiac: Irregular; no murmurs, rubs, or  gallops,no edema  Respiratory:  clear to auscultation bilaterally, normal work of breathing GI: soft, nontender, nondistended, + BS MS: no deformity or atrophy  Skin: warm and dry Neuro:  Strength and sensation are intact Psych: euthymic mood, full affect  EKG:  EKG is not ordered today. Personal review of the ekg ordered 01/24/19 shows atrial fibrillation, rate 83  Recent Labs: 08/09/2018: TSH 1.290 09/22/2018: Hemoglobin 15.4; Platelets 187 01/09/2019: ALT 31; BUN 30; Creatinine, Ser 1.31; Magnesium 2.3; Potassium 4.8; Sodium 142    Lipid Panel     Component Value Date/Time   CHOL 114  01/05/2019 1003   TRIG 39 01/05/2019 1003   HDL 57 01/05/2019 1003   CHOLHDL 2.0 01/05/2019 1003   CHOLHDL 3.8 10/12/2016 0902   VLDL 17 04/14/2016 0937   LDLCALC 47 01/05/2019 1003   LDLCALC 119 (H) 10/12/2016 0902     Wt Readings from Last 3 Encounters:  03/05/19 234 lb (106.1 kg)  02/27/19 238 lb 3.2 oz (108 kg)  02/13/19 241 lb (109.3 kg)      Other studies Reviewed: Additional studies/ records that were reviewed today include: TTE 02/01/2019 Review of the above records today demonstrates:   1. Left ventricular ejection fraction, by visual estimation, is 50 to 55%. The left ventricle has low normal function. There is moderately increased left ventricular hypertrophy.  2. Left ventricular diastolic function could not be evaluated.  3. Mildly dilated left ventricular internal cavity size.  4. The left ventricle has no regional wall motion abnormalities.  5. Global right ventricle has normal systolic function.The right ventricular size is normal. No increase in right ventricular wall thickness.  6. Left atrial size was mildly dilated.  7. Right atrial size was normal.  8. The mitral valve is normal in structure. Trivial mitral valve regurgitation.  9. The tricuspid valve is normal in structure. 10. The aortic valve is tricuspid. Aortic valve regurgitation is not visualized. No evidence of  aortic valve sclerosis or stenosis. 11. The pulmonic valve was grossly normal. Pulmonic valve regurgitation is not visualized. 12. Aortic dilatation noted. 13. There is mild dilatation of the ascending aorta measuring 39 mm. 14. The inferior vena cava is normal in size with greater than 50% respiratory variability, suggesting right atrial pressure of 3 mmHg.   ASSESSMENT AND PLAN:  1.  Persistent atrial fibrillation: CHA2DS2-VASc of at least 2 on flecainide, metoprolol, and Eliquis.  Spoke with him at length about either increasing flecainide and cardioversion versus ablation.  At this point, due to his wife's fall, he would prefer to increase flecainide and opt for cardioversion.  We Huong Luthi arrange this and I Stiles Maxcy see him back in 6 months for further discussions.  2.  Hypertension: Elevated today but is usually well controlled when he is in sinus rhythm.  He also has quite a bit of personal stress.  He Natalyah Cummiskey recheck his blood pressure after cardioversion.  3.  OSA: CPAP compliance encouraged   Current medicines are reviewed at length with the patient today.   The patient does not have concerns regarding his medicines.  The following changes were made today:  none  Labs/ tests ordered today include:  Orders Placed This Encounter  Procedures  . Basic metabolic panel  . CBC     Disposition:   FU with Alayshia Marini 6 months  Signed, Erianna Jolly Meredith Leeds, MD  03/05/2019 10:21 AM     [facility] [address] [place] [address] [phone] (office) [phone] (fax)

## 2019-03-05 NOTE — Patient Instructions (Addendum)
Medication Instructions:  Your physician has recommended you make the following change in your medication:  1. INCREASE Flecainide to 100 mg twice a day  *If you need a refill on your cardiac medications before your next appointment, please call your pharmacy*  Lab Work: Pre procedure labs today: BMET & CBC If you have labs (blood work) drawn today and your tests are completely normal, you will receive your results only by: Marland Kitchen MyChart Message (if you have MyChart) OR . A paper copy in the mail If you have any lab test that is abnormal or we need to change your treatment, we will call you to review the results.  Testing/Procedures: Your physician has recommended that you have a Cardioversion (DCCV). Electrical Cardioversion uses a jolt of electricity to your heart either through paddles or wired patches attached to your chest. This is a controlled, usually prescheduled, procedure. Defibrillation is done under light anesthesia in the hospital, and you usually go home the day of the procedure. This is done to get your heart back into a normal rhythm. You are not awake for the procedure. Please see the instruction sheet given to you today.    Follow-Up: At [facility], you and your health needs are our priority.  As part of our continuing mission to provide you with exceptional heart care, we have created designated Provider Care Teams.  These Care Teams include your primary Cardiologist (physician) and Advanced Practice Providers (APPs -  Physician Assistants and Nurse Practitioners) who all work together to provide you with the care you need, when you need it.  Your next appointment:   3 month(s)  The format for your next appointment:   In Person  Provider:   You may see  one of the following Advanced Practice Providers on your designated Care Team:    Chanetta Marshall, NP  Tommye Standard, PA-C  Legrand Como "Oda [place], [place]  Your physician wants you to follow-up in: 6 months with Dr.  Curt Bears. You will receive a reminder letter in the mail two months in advance. If you don't receive a letter, please call our office to schedule the follow-up appointment.    Other Instructions  COVID TEST-- On 03/09/19 @ 10:30 am - You will go to [facility] hospital ([place]) for your Covid testing.   This is a drive thru test site.  There will be multiple testing areas.  Be sure to share with the first checkpoint that you are there for pre-procedure/surgery testing. This will put you into the right (yellow) lane that leads to the PAT testing team. Stay in your car and the nurse team will come to your car to test you.  After you are tested please go home and self quarantine until the day of your procedure.      Cardioversion Instructions  Please arrive at the [facility] main entrance of [facility] hospital on 03/13/19 at  8:30 am Do not eat or drink after midnight prior to procedure Hold your Metformin & Flomax the morning of this procedure. Take your remaining medications as normal the morning of your procedure with a sip of water. You will need someone to drive you home at discharge

## 2019-03-09 ENCOUNTER — Other Ambulatory Visit (HOSPITAL_COMMUNITY)
Admission: RE | Admit: 2019-03-09 | Discharge: 2019-03-09 | Disposition: A | Discharge status: Home / Self Care | Payer: Medicare PPO | Source: Ambulatory Visit | Attending: Cardiovascular Disease | Admitting: Cardiovascular Disease

## 2019-03-09 DIAGNOSIS — Z20822 Contact with and (suspected) exposure to covid-19: Secondary | ICD-10-CM | POA: Diagnosis not present

## 2019-03-09 DIAGNOSIS — Z01812 Encounter for preprocedural laboratory examination: Secondary | ICD-10-CM | POA: Insufficient documentation

## 2019-03-09 LAB — SARS CORONAVIRUS 2 (TAT 6-24 HRS): SARS Coronavirus 2: NEGATIVE

## 2019-03-11 ENCOUNTER — Ambulatory Visit: Payer: Medicare PPO

## 2019-03-13 ENCOUNTER — Encounter (HOSPITAL_COMMUNITY)
Admission: RE | Disposition: A | Discharge status: Home / Self Care | Payer: Self-pay | Source: Home / Self Care | Attending: Cardiovascular Disease

## 2019-03-13 ENCOUNTER — Ambulatory Visit (HOSPITAL_COMMUNITY)
Admission: RE | Admit: 2019-03-13 | Discharge: 2019-03-13 | Disposition: A | Discharge status: Home / Self Care | Payer: Medicare PPO | Attending: Cardiovascular Disease | Admitting: Cardiovascular Disease

## 2019-03-13 ENCOUNTER — Ambulatory Visit (HOSPITAL_COMMUNITY): Payer: Medicare PPO | Admitting: Anesthesiology

## 2019-03-13 ENCOUNTER — Encounter (HOSPITAL_COMMUNITY): Payer: Self-pay | Admitting: Cardiovascular Disease

## 2019-03-13 ENCOUNTER — Other Ambulatory Visit: Payer: Self-pay

## 2019-03-13 DIAGNOSIS — I4819 Other persistent atrial fibrillation: Secondary | ICD-10-CM | POA: Insufficient documentation

## 2019-03-13 DIAGNOSIS — Z96642 Presence of left artificial hip joint: Secondary | ICD-10-CM | POA: Insufficient documentation

## 2019-03-13 DIAGNOSIS — Z9989 Dependence on other enabling machines and devices: Secondary | ICD-10-CM | POA: Diagnosis not present

## 2019-03-13 DIAGNOSIS — Z7982 Long term (current) use of aspirin: Secondary | ICD-10-CM | POA: Diagnosis not present

## 2019-03-13 DIAGNOSIS — G4733 Obstructive sleep apnea (adult) (pediatric): Secondary | ICD-10-CM | POA: Diagnosis not present

## 2019-03-13 DIAGNOSIS — M160 Bilateral primary osteoarthritis of hip: Secondary | ICD-10-CM | POA: Diagnosis not present

## 2019-03-13 DIAGNOSIS — Z6830 Body mass index (BMI) 30.0-30.9, adult: Secondary | ICD-10-CM | POA: Diagnosis not present

## 2019-03-13 DIAGNOSIS — I10 Essential (primary) hypertension: Secondary | ICD-10-CM | POA: Diagnosis not present

## 2019-03-13 DIAGNOSIS — I4891 Unspecified atrial fibrillation: Secondary | ICD-10-CM | POA: Diagnosis not present

## 2019-03-13 DIAGNOSIS — Z8249 Family history of ischemic heart disease and other diseases of the circulatory system: Secondary | ICD-10-CM | POA: Insufficient documentation

## 2019-03-13 DIAGNOSIS — E669 Obesity, unspecified: Secondary | ICD-10-CM | POA: Insufficient documentation

## 2019-03-13 DIAGNOSIS — G3184 Mild cognitive impairment, so stated: Secondary | ICD-10-CM | POA: Insufficient documentation

## 2019-03-13 DIAGNOSIS — H269 Unspecified cataract: Secondary | ICD-10-CM | POA: Insufficient documentation

## 2019-03-13 DIAGNOSIS — Z7984 Long term (current) use of oral hypoglycemic drugs: Secondary | ICD-10-CM | POA: Diagnosis not present

## 2019-03-13 DIAGNOSIS — Z888 Allergy status to other drugs, medicaments and biological substances status: Secondary | ICD-10-CM | POA: Insufficient documentation

## 2019-03-13 DIAGNOSIS — E785 Hyperlipidemia, unspecified: Secondary | ICD-10-CM | POA: Diagnosis not present

## 2019-03-13 DIAGNOSIS — Z79899 Other long term (current) drug therapy: Secondary | ICD-10-CM | POA: Diagnosis not present

## 2019-03-13 DIAGNOSIS — Z7901 Long term (current) use of anticoagulants: Secondary | ICD-10-CM | POA: Insufficient documentation

## 2019-03-13 DIAGNOSIS — Z8261 Family history of arthritis: Secondary | ICD-10-CM | POA: Diagnosis not present

## 2019-03-13 DIAGNOSIS — H8109 Meniere's disease, unspecified ear: Secondary | ICD-10-CM | POA: Insufficient documentation

## 2019-03-13 HISTORY — PX: CARDIOVERSION: SHX1299

## 2019-03-13 LAB — POCT I-STAT, CHEM 8
BUN: 30 mg/dL — ABNORMAL HIGH (ref 8–23)
Calcium, Ion: 1.41 mmol/L — ABNORMAL HIGH (ref 1.15–1.40)
Chloride: 105 mmol/L (ref 98–111)
Creatinine, Ser: 1.5 mg/dL — ABNORMAL HIGH (ref 0.61–1.24)
Glucose, Bld: 121 mg/dL — ABNORMAL HIGH (ref 70–99)
HCT: 46 % (ref 39.0–52.0)
Hemoglobin: 15.6 g/dL (ref 13.0–17.0)
Potassium: 4.5 mmol/L (ref 3.5–5.1)
Sodium: 137 mmol/L (ref 135–145)
TCO2: 25 mmol/L (ref 22–32)

## 2019-03-13 SURGERY — CARDIOVERSION
Anesthesia: General

## 2019-03-13 MED ORDER — PROPOFOL 10 MG/ML IV BOLUS
INTRAVENOUS | Status: DC | PRN
  Administered 2019-03-13: 40 mg via INTRAVENOUS
  Administered 2019-03-13: 20 mg via INTRAVENOUS
  Administered 2019-03-13: 60 mg via INTRAVENOUS
  Administered 2019-03-13: 30 mg via INTRAVENOUS

## 2019-03-13 MED ORDER — SODIUM CHLORIDE 0.9 % IV SOLN: INTRAVENOUS | Status: DC | PRN

## 2019-03-13 MED ORDER — LIDOCAINE 2% (20 MG/ML) 5 ML SYRINGE
INTRAMUSCULAR | Status: DC | PRN
  Administered 2019-03-13: 80 mg via INTRAVENOUS

## 2019-03-13 NOTE — Discharge Instructions (Signed)
Electrical Cardioversion Electrical cardioversion is the delivery of a jolt of electricity to restore a normal rhythm to the heart. A rhythm that is too fast or is not regular keeps the heart from pumping well. In this procedure, sticky patches or metal paddles are placed on the chest to deliver electricity to the heart from a device. This procedure may be done in an emergency if:  There is low or no blood pressure as a result of the heart rhythm.  Normal rhythm must be restored as fast as possible to protect the brain and heart from further damage.  It may save a life. This may also be a scheduled procedure for irregular or fast heart rhythms that are not immediately life-threatening. Tell a health care provider about:  Any allergies you have.  All medicines you are taking, including vitamins, herbs, eye drops, creams, and over-the-counter medicines.  Any problems you or family members have had with anesthetic medicines.  Any blood disorders you have.  Any surgeries you have had.  Any medical conditions you have.  Whether you are pregnant or may be pregnant. What are the risks? Generally, this is a safe procedure. However, problems may occur, including:  Allergic reactions to medicines.  A blood clot that breaks free and travels to other parts of your body.  The possible return of an abnormal heart rhythm within hours or days after the procedure.  Your heart stopping (cardiac arrest). This is rare. What happens before the procedure? Medicines  Your health care provider may have you start taking: ? Blood-thinning medicines (anticoagulants) so your blood does not clot as easily. ? Medicines to help stabilize your heart rate and rhythm.  Ask your health care provider about: ? Changing or stopping your regular medicines. This is especially important if you are taking diabetes medicines or blood thinners. ? Taking medicines such as aspirin and ibuprofen. These medicines can  thin your blood. Do not take these medicines unless your health care provider tells you to take them. ? Taking over-the-counter medicines, vitamins, herbs, and supplements. General instructions  Follow instructions from your health care provider about eating or drinking restrictions.  Plan to have someone take you home from the hospital or clinic.  If you will be going home right after the procedure, plan to have someone with you for 24 hours.  Ask your health care provider what steps will be taken to help prevent infection. These may include washing your skin with a germ-killing soap. What happens during the procedure?   An IV will be inserted into one of your veins.  Sticky patches (electrodes) or metal paddles may be placed on your chest.  You will be given a medicine to help you relax (sedative).  An electrical shock will be delivered. The procedure may vary among health care providers and hospitals. What can I expect after the procedure?  Your blood pressure, heart rate, breathing rate, and blood oxygen level will be monitored until you leave the hospital or clinic.  Your heart rhythm will be watched to make sure it does not change.  You may have some redness on the skin where the shocks were given. Follow these instructions at home:  Do not drive for 24 hours if you were given a sedative during your procedure.  Take over-the-counter and prescription medicines only as told by your health care provider.  Ask your health care provider how to check your pulse. Check it often.  Rest for 48 hours after the procedure or   as told by your health care provider.  Avoid or limit your caffeine use as told by your health care provider.  Keep all follow-up visits as told by your health care provider. This is important. Contact a health care provider if:  You feel like your heart is beating too quickly or your pulse is not regular.  You have a serious muscle cramp that does not go  away. Get help right away if:  You have discomfort in your chest.  You are dizzy or you feel faint.  You have trouble breathing or you are short of breath.  Your speech is slurred.  You have trouble moving an arm or leg on one side of your body.  Your fingers or toes turn cold or blue. Summary  Electrical cardioversion is the delivery of a jolt of electricity to restore a normal rhythm to the heart.  This procedure may be done right away in an emergency or may be a scheduled procedure if the condition is not an emergency.  Generally, this is a safe procedure.  After the procedure, check your pulse often as told by your health care provider. This information is not intended to replace advice given to you by your health care provider. Make sure you discuss any questions you have with your health care provider. Document Revised: 08/07/2018 Document Reviewed: 08/07/2018 Elsevier Patient Education  2020 [employer]  

## 2019-03-13 NOTE — Transfer of Care (Signed)
Immediate Anesthesia Transfer of Care Note  Patient: Clayton [name]  Procedure(s) Performed: CARDIOVERSION (N/A )  Patient Location: PACU  Anesthesia Type:General  Level of Consciousness: patient cooperative and responds to stimulation  Airway & Oxygen Therapy: Patient Spontanous Breathing  Post-op Assessment: Report given to RN and Post -op Vital signs reviewed and stable  Post vital signs: Reviewed and stable  Last Vitals:  Vitals Value Taken Time  BP    Temp    Pulse    Resp    SpO2      Last Pain:  Vitals:   03/13/19 0901  PainSc: 0-No pain         Complications: No apparent anesthesia complications

## 2019-03-13 NOTE — Interval H&P Note (Signed)
History and Physical Interval Note:  03/13/2019 10:01 AM  Clayton [name]  has presented today for surgery, with the diagnosis of AFIB.  The various methods of treatment have been discussed with the patient and family. After consideration of risks, benefits and other options for treatment, the patient has consented to  Procedure(s): CARDIOVERSION (N/A) as a surgical intervention.  The patient's history has been reviewed, patient examined, no change in status, stable for surgery.  I have reviewed the patient's chart and labs.  Questions were answered to the patient's satisfaction.     Skeet Latch, MD

## 2019-03-13 NOTE — CV Procedure (Signed)
Electrical Cardioversion Procedure Note Clayton [name] [id number] 05-12-1945  Procedure: Electrical Cardioversion Indications:  Atrial Fibrillation  Procedure Details Consent: Risks of procedure as well as the alternatives and risks of each were explained to the (patient/caregiver).  Consent for procedure obtained. Time Out: Verified patient identification, verified procedure, site/side was marked, verified correct patient position, special equipment/implants available, medications/allergies/relevent history reviewed, required imaging and test results available.  Performed  Patient placed on cardiac monitor, pulse oximetry, supplemental oxygen as necessary.  Sedation given: propofol Pacer pads placed anterior and posterior chest.  Cardioverted 3 time(s).  Cardioverted at 150J unsuccessful, 200J unsuccessful, 200J with pressure successful.  Evaluation Findings: Post procedure EKG shows: sinus rhtyhm with first degree AV block. Complications: None Patient did tolerate procedure well.   Skeet Latch, MD 03/13/2019, 10:09 AM

## 2019-03-13 NOTE — Anesthesia Postprocedure Evaluation (Signed)
Anesthesia Post Note  Patient: Clayton [name]  Procedure(s) Performed: CARDIOVERSION (N/A )     Patient location during evaluation: PACU Anesthesia Type: General Level of consciousness: awake and alert Pain management: pain level controlled Vital Signs Assessment: post-procedure vital signs reviewed and stable Respiratory status: spontaneous breathing, nonlabored ventilation, respiratory function stable and patient connected to nasal cannula oxygen Cardiovascular status: blood pressure returned to baseline and stable Postop Assessment: no apparent nausea or vomiting Anesthetic complications: no    Last Vitals:  Vitals:   03/13/19 1013 03/13/19 1023  BP: (!) 156/94 (!) 141/102  Pulse:  61  Resp: (!) 97 (!) 21  Temp: 37.1 C   SpO2:  97%    Last Pain:  Vitals:   03/13/19 1023  TempSrc:   PainSc: 0-No pain                 Barnet Glasgow

## 2019-03-13 NOTE — Interval H&P Note (Signed)
History and Physical Interval Note:  03/13/2019 9:50 AM  Clayton [name]  has presented today for surgery, with the diagnosis of AFIB.  The various methods of treatment have been discussed with the patient and family. After consideration of risks, benefits and other options for treatment, the patient has consented to  Procedure(s): CARDIOVERSION (N/A) as a surgical intervention.  The patient's history has been reviewed, patient examined, no change in status, stable for surgery.  I have reviewed the patient's chart and labs.  Questions were answered to the patient's satisfaction.     Skeet Latch, MD

## 2019-03-13 NOTE — Anesthesia Preprocedure Evaluation (Addendum)
Anesthesia Evaluation  Patient identified by MRN, date of birth, ID band Patient awake    Reviewed: Allergy & Precautions, NPO status , Patient's Chart, lab work & pertinent test results  Airway Mallampati: II  TM Distance: >3 FB Neck ROM: Full    Dental no notable dental hx. (+) Teeth Intact, Dental Advisory Given   Pulmonary sleep apnea ,    Pulmonary exam normal breath sounds clear to auscultation       Cardiovascular hypertension, Pt. on medications Normal cardiovascular exam+ dysrhythmias Atrial Fibrillation  Rhythm:Regular Rate:Normal  Ef 50-55%   Neuro/Psych TIAnegative psych ROS   GI/Hepatic negative GI ROS, Neg liver ROS,   Endo/Other  negative endocrine ROS  Renal/GU negative Renal ROS     Musculoskeletal   Abdominal   Peds  Hematology negative hematology ROS (+)   Anesthesia Other Findings   Reproductive/Obstetrics                            Anesthesia Physical Anesthesia Plan  ASA: III  Anesthesia Plan: General   Post-op Pain Management:    Induction: Intravenous  PONV Risk Score and Plan: Treatment may vary due to age or medical condition  Airway Management Planned: Nasal Cannula and Natural Airway  Additional Equipment:   Intra-op Plan:   Post-operative Plan: Extubation in OR  Informed Consent: I have reviewed the patients History and Physical, chart, labs and discussed the procedure including the risks, benefits and alternatives for the proposed anesthesia with the patient or authorized representative who has indicated his/her understanding and acceptance.     Dental advisory given  Plan Discussed with:   Anesthesia Plan Comments:         Anesthesia Quick Evaluation

## 2019-03-14 ENCOUNTER — Other Ambulatory Visit: Payer: Self-pay

## 2019-03-14 ENCOUNTER — Encounter (INDEPENDENT_AMBULATORY_CARE_PROVIDER_SITE_OTHER): Payer: Self-pay | Admitting: Family Medicine

## 2019-03-14 ENCOUNTER — Telehealth (INDEPENDENT_AMBULATORY_CARE_PROVIDER_SITE_OTHER): Payer: Medicare PPO | Admitting: Family Medicine

## 2019-03-14 DIAGNOSIS — R7303 Prediabetes: Secondary | ICD-10-CM

## 2019-03-14 DIAGNOSIS — E669 Obesity, unspecified: Secondary | ICD-10-CM | POA: Diagnosis not present

## 2019-03-14 DIAGNOSIS — Z6830 Body mass index (BMI) 30.0-30.9, adult: Secondary | ICD-10-CM | POA: Diagnosis not present

## 2019-03-14 MED ORDER — METFORMIN HCL 500 MG PO TABS: 500.0000 mg | ORAL_TABLET | Freq: Every day | ORAL | 0 refills | Status: DC

## 2019-03-14 NOTE — Progress Notes (Signed)
TeleHealth Visit:  Due to the COVID-19 pandemic, this visit was completed with telemedicine (audio/video) technology to reduce patient and provider exposure as well as to preserve personal protective equipment.   Chaos has verbally consented to this TeleHealth visit. The patient is located at home, the provider is located at the [facility] office. The participants in this visit include the listed provider and patient. Cruiz was unable to use realtime audiovisual technology today and the telehealth visit was conducted via telephone.   Chief Complaint: OBESITY Orba is here to discuss his progress with his obesity treatment plan along with follow-up of his obesity related diagnoses. Bluford is on keeping a food journal and adhering to recommended goals of 1800 calories and 100 grams of protein daily and states he is following his eating plan approximately 100% of the time. Zadin states he is walking 3 miles daily.  Today's visit was #: 17 Starting weight: 266 lbs Starting date: 11/01/17  Interim History: Rufas feels he has done well maintaining his weight. He reports a weight of 228 lbs today. He and his wife has had a lot of health issues since his last visit 3 weeks ago, and he did well with mindful eating. He thinks he is ready to go back to his Low carbohydrate diet plan.  Subjective:   1. Pre-diabetes Kelynn continue to do well with diet and weight maintenance. He is tolerating metformin well, and he denies nausea or vomiting.   Assessment/Plan:   1. Pre-diabetes Doss will continue to work on weight loss, diet, exercise, and decreasing simple carbohydrates to help decrease the risk of diabetes. We will refill metformin for 1 month, and we will recheck labs at his next in office visit.  - metFORMIN (GLUCOPHAGE) 500 MG tablet; Take 1 tablet (500 mg total) by mouth daily with breakfast.  Dispense: 30 tablet; Refill: 0  2. Class 1 obesity with serious  comorbidity and body mass index (BMI) of 30.0 to 30.9 in adult, unspecified obesity type Eyob is currently in the action stage of change. As such, his goal is to continue with weight loss efforts. He has agreed to change to following a lower carbohydrate, vegetable and lean protein rich diet plan.   Exercise goals: Dalynn is to increase his walking with nice weather.  Behavioral modification strategies: decreasing simple carbohydrates.  Tahmir has agreed to follow-up with our clinic in 4 weeks. He was informed of the importance of frequent follow-up visits to maximize his success with intensive lifestyle modifications for his multiple health conditions.  Objective:   VITALS: Per patient if applicable, see vitals. GENERAL: Alert and in no acute distress. CARDIOPULMONARY: No increased WOB. Speaking in clear sentences.  PSYCH: Pleasant and cooperative. Speech normal rate and rhythm. Affect is appropriate. Insight and judgement are appropriate. Attention is focused, linear, and appropriate.  NEURO: Oriented as arrived to appointment on time with no prompting.   Lab Results  Component Value Date   CREATININE 1.50 (H) 03/13/2019   BUN 30 (H) 03/13/2019   NA 137 03/13/2019   K 4.5 03/13/2019   CL 105 03/13/2019   CO2 23 03/05/2019   Lab Results  Component Value Date   ALT 31 01/09/2019   AST 30 01/09/2019   ALKPHOS 64 01/09/2019   BILITOT 0.8 01/09/2019   Lab Results  Component Value Date   HGBA1C 5.8 (H) 09/04/2018   HGBA1C 6.0 (H) 11/01/2017   HGBA1C 5.4 04/14/2016   HGBA1C 6.0 06/09/2015  Lab Results  Component Value Date   INSULIN 9.0 09/04/2018   INSULIN 22.4 11/01/2017   Lab Results  Component Value Date   TSH 1.290 08/09/2018   Lab Results  Component Value Date   CHOL 114 01/05/2019   HDL 57 01/05/2019   LDLCALC 47 01/05/2019   TRIG 39 01/05/2019   CHOLHDL 2.0 01/05/2019   Lab Results  Component Value Date   WBC 4.8 03/05/2019   HGB 15.6 03/13/2019    HCT 46.0 03/13/2019   MCV 90 03/05/2019   PLT 168 03/05/2019   No results found for: IRON, TIBC, FERRITIN  Attestation Statements:   Reviewed by clinician on day of visit: allergies, medications, problem list, medical history, surgical history, family history, social history, and previous encounter notes.  Time spent on visit including pre-visit chart review and post-visit care was 22 minutes.    I, Trixie Dredge, am acting as transcriptionist for Dennard Nip, MD.  I have reviewed the above documentation for accuracy and completeness, and I agree with the above. - Dennard Nip, MD

## 2019-03-15 ENCOUNTER — Other Ambulatory Visit: Payer: Self-pay | Admitting: Family Medicine

## 2019-03-22 ENCOUNTER — Other Ambulatory Visit (INDEPENDENT_AMBULATORY_CARE_PROVIDER_SITE_OTHER): Payer: Self-pay | Admitting: Family Medicine

## 2019-03-22 DIAGNOSIS — R7303 Prediabetes: Secondary | ICD-10-CM

## 2019-03-27 ENCOUNTER — Ambulatory Visit: Payer: Medicare PPO | Admitting: Family Medicine

## 2019-03-27 ENCOUNTER — Encounter: Payer: Self-pay | Admitting: Family Medicine

## 2019-03-27 ENCOUNTER — Other Ambulatory Visit: Payer: Self-pay

## 2019-03-27 VITALS — BP 142/78 | HR 66 | Ht 75.0 in | Wt 242.0 lb

## 2019-03-27 DIAGNOSIS — M545 Low back pain: Secondary | ICD-10-CM | POA: Diagnosis not present

## 2019-03-27 DIAGNOSIS — M999 Biomechanical lesion, unspecified: Secondary | ICD-10-CM

## 2019-03-27 DIAGNOSIS — G8929 Other chronic pain: Secondary | ICD-10-CM | POA: Diagnosis not present

## 2019-03-27 NOTE — Assessment & Plan Note (Signed)
Decision today to treat with OMT was based on Physical Exam  After verbal consent patient was treated with HVLA, ME, FPR techniques in  thoracic, lumbar and sacral areas  Patient tolerated the procedure well with improvement in symptoms  Patient given exercises, stretches and lifestyle modifications  See medications in patient instructions if given  Patient will follow up in 4-8 weeks 

## 2019-03-27 NOTE — Progress Notes (Signed)
[facility] [address] [place] Phone: [phone] Subjective:   I Clayton [name] am serving as [name] Education administrator for Dr. Hulan Saas.  This visit occurred during the SARS-CoV-2 public health emergency.  Safety protocols were in place, including screening questions prior to the visit, additional usage of staff PPE, and extensive cleaning of exam room while observing appropriate contact time as indicated for disinfecting solutions.   I'm seeing this patient by the request  of:  Kuneff, Renee A, DO  CC: Low back pain follow-up.  [id number]  Clayton [name] is [name] 74 y.o. male coming in with complaint of back pain. Last seen on 02/03/2019 for OMT. Patient states he is doing well. OMT.  Patient states that he has been doing [name] little bit more activity around the house with cleaning meat, some mild increase in tightness but nothing severe.  Patient states no radiation of pain.  Denies any fevers or chills.  Recently though patient did undergo and cardioversion and thinks that that could have contributed to some of the tightness as well.      Past Medical History:  Diagnosis Date  . Arthritis    hips   . Atrial fibrillation ([place])   . Cataract   . CPAP (continuous positive airway pressure) dependence   . Dysrhythmia    PAF- hx of 7 years ago   . HLD (hyperlipidemia)   . Hypertension 05/29/10   ECHO-EF 67% wnl  . Meniere disease   . Obesity   . Palpitations   . Sleep apnea 09/14/04   [facility]- Dr. Humphrey Rolls; CPAP titration 11/12/04- Dr. Humphrey Rolls  . Stroke [facility])    Past Surgical History:  Procedure Laterality Date  . CARDIAC CATHETERIZATION  11/03/05  . CARDIOVERSION N/[name] 09/26/2018   Procedure: CARDIOVERSION;  Surgeon: Geralynn Rile, MD;  Location: [facility];  Service: Endoscopy;  Laterality: N/[name];  . CARDIOVERSION N/[name] 03/13/2019   Procedure: CARDIOVERSION;  Surgeon: Skeet Latch, MD;  Location: [facility];  Service:  Cardiovascular;  Laterality: N/[name];  . CATARACT EXTRACTION  2014  . KNEE ARTHROSCOPY     left knee- 2002   . TOTAL HIP ARTHROPLASTY  06/08/2011   Procedure: TOTAL HIP ARTHROPLASTY ANTERIOR APPROACH;  Surgeon: [name], MD;  Location: [facility];  Service: Orthopedics;  Laterality: Left;   Social History   Socioeconomic History  . Marital status: Married    Spouse name: Clayton [name]  . Number of children: Not on file  . Years of education: Not on file  . Highest education level: Not on file  Occupational History  . Occupation: Retired  Tobacco Use  . Smoking status: Never Smoker  . Smokeless tobacco: Never Used  Substance and Sexual Activity  . Alcohol use: No  . Drug use: No  . Sexual activity: Yes    Birth control/protection: None  Other Topics Concern  . Not on file  Social History Narrative   Married. [name].Takes care of his mother-in-law as well.    Retired, Conservator, museum/gallery.    Drinks caffeine beverages. Takes [name] daily vitamin.   Exercises routinely.    Smoke detector in the home.    Ambulates independently.    Social Determinants of Health   Financial Resource Strain:   . Difficulty of Paying Living Expenses: Not on file  Food Insecurity:   . Worried About Charity fundraiser in the Last Year: Not on file  . Ran Out of  Food in the Last Year: Not on file  Transportation Needs:   . Lack of Transportation (Medical): Not on file  . Lack of Transportation (Non-Medical): Not on file  Physical Activity:   . Days of Exercise per Week: Not on file  . Minutes of Exercise per Session: Not on file  Stress:   . Feeling of Stress : Not on file  Social Connections:   . Frequency of Communication with Friends and Family: Not on file  . Frequency of Social Gatherings with Friends and Family: Not on file  . Attends Religious Services: Not on file  . Active Member of Clubs or Organizations: Not on file  . Attends Archivist Meetings: Not on file  . Marital  Status: Not on file   Allergies  Allergen Reactions  . Rosuvastatin     Other reaction(s): GI Upset (intolerance) Crestor - Intestinal issues   . Statins Other (See Comments)    Crestor - Intestinal issues    Family History  Problem Relation Age of Onset  . Hypertension Mother   . Stroke Mother   . Diabetes Mother   . Hyperlipidemia Mother   . Obesity Mother   . Diabetes Sister   . Stroke Maternal Grandmother        28 died   . Heart disease Maternal Grandfather   . Hypertension Maternal Grandfather   . Diabetes Maternal Grandfather   . Arthritis Father   . Heart disease Father   . Heart disease Paternal Grandfather     Current Outpatient Medications (Endocrine & Metabolic):  .  metFORMIN (GLUCOPHAGE) 500 MG tablet, Take 1 tablet (500 mg total) by mouth daily with breakfast.  Current Outpatient Medications (Cardiovascular):  .  ezetimibe (ZETIA) 10 MG tablet, TAKE 1 TABLET BY MOUTH EVERY DAY (Patient taking differently: Take 10 mg by mouth daily. ) .  flecainide (TAMBOCOR) 100 MG tablet, Take 1 tablet (100 mg total) by mouth 2 (two) times daily. Marland Kitchen  LIVALO 2 MG TABS, TAKE 1 TABLET BY MOUTH EVERY DAY (Patient taking differently: Take 2 mg by mouth daily. ) .  metoprolol succinate (TOPROL-XL) 100 MG 24 hr tablet, Take 100 mg in the evenings (along with 25 mg or 50 mg in the mornings) (Patient taking differently: Take 100 mg by mouth at bedtime. ) .  metoprolol succinate (TOPROL-XL) 25 MG 24 hr tablet, TAKE 25 MG IN THE MORNING (ALONG WITH 100 MG IN THE EVENING) FOR 5 DAYS- THEN IF PULSE STILL IN 70'S INCREASE TO 50 MG IN THE MORNING (ALONG WITH 100 MG IN THE EVENING) (Patient taking differently: Take 25 mg by mouth daily. ) .  olmesartan (BENICAR) 20 MG tablet, TAKE 1 TABLET BY MOUTH EVERY DAY (Patient taking differently: Take 20 mg by mouth daily. ) .  spironolactone (ALDACTONE) 25 MG tablet, TAKE 1/2 TABLET BY MOUTH EVERY DAY (Patient taking differently: Take 12.5 mg by mouth  daily. ) .  diltiazem (CARDIZEM CD) 180 MG 24 hr capsule, Take 1 capsule (180 mg total) by mouth daily.   Current Outpatient Medications (Analgesics):  .  aspirin EC 81 MG tablet, Take 1 tablet (81 mg total) by mouth daily.  Current Outpatient Medications (Hematological):  Marland Kitchen  ELIQUIS 5 MG TABS tablet, TAKE 1 TABLET BY MOUTH TWICE [name] DAY (Patient taking differently: Take 5 mg by mouth 2 (two) times daily. )  Current Outpatient Medications (Other):  .  Apoaequorin (PREVAGEN) 10 MG CAPS, Take 1 capsule by mouth every morning. (Patient  taking differently: Take 10 mg by mouth daily. ) .  Ascorbic Acid (VITAMIN C) 1000 MG tablet, Take 1,000 mg by mouth daily.  .  Boswellia-Glucosamine-Vit D (GLUCOSAMINE COMPLEX PO), Take 3 tablets by mouth daily.  .  Coenzyme Q10 200 MG capsule, Take 200 mg by mouth daily.  .  Multiple Vitamin (MULITIVITAMIN WITH MINERALS) TABS, Take 1 tablet by mouth daily.  [name] 3-6-9 Fatty Acids (OMEGA-3-6-9 PO), Take 3 capsules by mouth every morning. Omega 3= 800mg ,omega 6=380mg ,omega 9 =300mg  .  tamsulosin (FLOMAX) 0.4 MG CAPS capsule, Take 0.4 mg by mouth daily. Marland Kitchen  triamcinolone cream (KENALOG) 0.5 %, APPLY 1 APPLICATION TOPICALLY 2 (TWO) TIMES DAILY. TO AFFECTED AREAS. Marland Kitchen  zinc gluconate 50 MG tablet, Take 50 mg by mouth daily.    Reviewed prior external information including notes and imaging from  primary care provider As well as notes that were available from care everywhere and other healthcare systems.  Past medical history, social, surgical and family history all reviewed in electronic medical record.  No pertanent information unless stated regarding to the chief complaint.   Review of Systems:  No headache, visual changes, nausea, vomiting, diarrhea, constipation, dizziness, abdominal pain, skin rash, fevers, chills, night sweats, weight loss, swollen lymph nodes, body aches, joint swelling, chest pain, shortness of breath, mood changes. POSITIVE muscle  aches  Objective  Blood pressure (!) 142/78, pulse 66, height 6\' 3"  (1.905 m), weight 242 lb (109.8 kg), SpO2 97 %.   General: No apparent distress alert and oriented x3 mood and affect normal, dressed appropriately.  HEENT: Pupils equal, extraocular movements intact  Respiratory: Patient's speak in full sentences and does not appear short of breath  Cardiovascular: No lower extremity edema, non tender, no erythema  Skin: Warm dry intact with no signs of infection or rash on extremities or on axial skeleton.  Abdomen: Soft nontender  Neuro: Cranial nerves II through XII are intact, neurovascularly intact in all extremities with 2+ DTRs and 2+ pulses.  Lymph: No lymphadenopathy of posterior or anterior cervical chain or axillae bilaterally.  Gait normal with good balance and coordination.  MSK:  tender with full range of motion and good stability and symmetric strength and tone of shoulders, elbows, wrist, hip, knee and ankles bilaterally.  Mild arthritic changes of multiple joints. Low back exam does have some tenderness but more of the upper lumbar spine than usual.  Patient is Left greater than right.  Patient does have some mild tightness with straight leg test but no radicular symptoms.  No spinous process tenderness.  5/5 strength in lower extremities.  Osteopathic findings  T9 extended rotated and side bent left L1 flexed rotated and side bent right Sacrum r left on left    Impression and Recommendations:     This case required medical decision making of moderate complexity. The above documentation has been reviewed and is accurate and complete [name], DO       Note: This dictation was prepared with Dragon dictation along with smaller phrase technology. Any transcriptional errors that result from this process are unintentional.

## 2019-03-27 NOTE — Patient Instructions (Addendum)
Good to see you See me again in 6 weeks 

## 2019-03-27 NOTE — Assessment & Plan Note (Signed)
Low back pain is multifactorial.  Discussed with patient on posture and numbness, discussed which activities to do which wants to avoid.  Patient is to increase activity slowly over the course the next several weeks.  Discussed proper lifting mechanics.  Has responded well to manipulation.  Chronic problem with mild exacerbation and possibly could have had mild increase in exacerbation from the cardioversion and the tightness of the hip flexors.  Patient will see me again in 6 weeks

## 2019-04-03 ENCOUNTER — Ambulatory Visit: Payer: Medicare PPO | Admitting: Cardiology

## 2019-04-04 ENCOUNTER — Encounter: Payer: Self-pay | Admitting: Cardiovascular Disease

## 2019-04-04 ENCOUNTER — Ambulatory Visit: Payer: Medicare PPO | Admitting: Cardiovascular Disease

## 2019-04-04 ENCOUNTER — Other Ambulatory Visit: Payer: Self-pay

## 2019-04-04 VITALS — BP 130/82 | HR 59 | Temp 97.3°F | Ht 75.0 in | Wt 238.0 lb

## 2019-04-04 DIAGNOSIS — I10 Essential (primary) hypertension: Secondary | ICD-10-CM | POA: Diagnosis not present

## 2019-04-04 DIAGNOSIS — Z7901 Long term (current) use of anticoagulants: Secondary | ICD-10-CM

## 2019-04-04 DIAGNOSIS — G4733 Obstructive sleep apnea (adult) (pediatric): Secondary | ICD-10-CM

## 2019-04-04 DIAGNOSIS — I480 Paroxysmal atrial fibrillation: Secondary | ICD-10-CM | POA: Diagnosis not present

## 2019-04-04 DIAGNOSIS — Z9989 Dependence on other enabling machines and devices: Secondary | ICD-10-CM

## 2019-04-04 NOTE — Progress Notes (Signed)
Patient ID: Clayton [name], male   DOB: 03-31-1945, 74 y.o.   MRN: [id number]     HPI: Clayton [name] is a 74 y.o. male who presents for a 2 month follow-up cardiology evaluation.  Mr. Schneller has a history of hypertension, a remote history of PAF, status post cardioversion in 2007, obstructive sleep apnea for which he uses CPAP 100% of the time, hypertension, and hyperlipidemia. He has had weight fluctuations over the years.  In the past, he also had a history of Mnire's disease.  There was some concern by [name] that perhaps this may have been related to statin use, which led to its discontinuance.  He is not had Mnire's disease in some time.  He underwent left hip replacement surgery by Dr. [name] and now has been able to continue to be active and exercises routinely.   His  blood pressure regimen has been amlodipine/benazepril 10/40 daily, hydrochlorothiazide has been taking 25 Mill grams in the evening and Toprol-XL 100 mg daily. He had atrial of livalo  For hyperlipidemia but developed some diarrhea secondary to this.    He has not had much success with weight loss over the past several years.    He continues to use his CPAP therapy with 100% compliance.  He will not even take a nap without his CPAP therapy and he takes his CPAP unit on alll his travels.   Since initiating CPAP therapy, he is unaware of any palpitations.  He denies any recurrent atrial fibrillation.  In the past he has had difficulty with family stress and his father had passed away, and his wife's mother recently died after having developed lung CA and had significant PVD.  He was not exercising as much as he had in the past.  He has not been successful in weight loss.  He continues to be active and still walks 3-4 miles per day.  Oftentimes in the morning when he awakens his blood pressure is elevated at 150/90 but after exercise it drops to 128/80.  He is unaware of any recurrent atrial fibrillation.  He continues  to use CPAP with 100% compliance.   He completed a two-year grieving process.  On 01/19/2016 he committed to weight loss.  On January 1, he weighed 278 pounds .  He has been walking 4 miles 5 days per week as well as some intermittent stationary bike.  He has lost a total of 32 pounds since January.  He continues to use CPAP with 100% compliance and since he has been on CPAP, he has not had any recurrent atrial fibrillation and his blood pressure has been less labile.  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  He has had failures to Crestor and Lipitor in the past due to significant myalgias, development, even with weekly dosing.  I resumed zetia   In August 2018 he noticed his heart rate increasing and he presented for evaluation and was seen by [name], PAC.  He admitted to a rare palpitation.  He denied any chest pain with exertion or dyspnea on exertion.  He has suffered a broken rib.  This year.  He has a weight goal of 220 pounds.    I  saw him in September 2018, at which time he was doing well from a cardiovascular standpoint.   His father was ill and ultimately passed away in [place].  As result he was going back and forth to the [facility] area.  This resulted  in a change in his diet and ability to exercise.  I last saw him in October 2019 and his weight had increased from 240 back up to 270 pounds.  He was unaware of any episodes of atrial fibrillation.  He was using CPAP with 100% compliance along with his naps.  He was committed to begin weight loss again.    Since I saw him on October 19, 2018 he has had major lifestyle change.  He has been working with Dr. Leafy Ro in the healthy weight and wellness group.  Peak weight was 280 and most recent weight 244.  I saw him for a cardiology evaluation on August 09, 2018.  O ver the past 4 months, he had been under increased stress as result of his wife's illness.  He has been checking his blood pressure regularly and this has been  stable but on his blood pressure recording he has noticed the message of heart rate irregularity.  He is unaware of A. fib but has noticed this message for the last 3 to 4 months.  He is exercising every day and typically walks 2 miles in the morning and in the afternoon or evening does 30 minutes of stationary bike at least 5 to 6 days/week.  He continues to use CPAP.  I obtained a download in the office today from June 22 through August 08, 2018.  He is 100% compliant.  He has a ResMed air sense 10 auto unit with a pressure range of 8-20 with 95% pressure at 10.1 and maximum average and 11.5.  AHI is excellent at 1.2.     During his August 09, 2018 encounter his ECG demonstrated that he was back in atrial fibrillation and had a ventricular rate at 89 bpm.  There was LVH with repolarization changes.  At that time, I recommended discontinuance of amlodipine/benazepril combination and in its place started Cardizem CD 180 mg rather than amlodipine and olmesartan 20 mg rather than benazepril.  I initiated anticoagulation with Eliquis 5 mg twice a day.  He underwent a 2D echo Doppler study on August 16, 2018 which showed EF low normal at 50 to 55%.  There was evidence for significant LVH and moderate dilation of his left atrium.  I saw him for follow-up evaluation on August 23, 2018.  At that time he was now cognizant of his heart rate irregularity.  He has continued to use CPAP with excellent compliance.  A new download was obtained from July 5 through August 21, 2018 which shows 100% compliance with average usage 7 hours and 28 minutes.  AHI is 1.6 and his 95th percentile auto pressure is 10.1 with a maximum average pressure of 11.4.  He states he has lost weight over the past several weeks purposefully.  He continues to exercise now on a stationary bike.  He denies any chest pain PND orthopnea.  He was tolerating Eliquis without bleeding.  During that evaluation, we discussed different options including several  additional weeks of increased weight control versus initiating antiarrhythmic therapy.  Since his only other episode of atrial fibrillation previously occurred 13 years ago and he had not had any recurrence until this year he opted for an increased rate control trial.  As result metoprolol dose was further titrated.  I saw on September 15, 2018 for follow-up evaluation.  At that time remained in atrial fibrillation despite his increase metoprolol succinate to 100 mg in the morning and 50 mg at night with continuation of diltiazem 180  mg, spironolactone 12.5 mg daily in addition to his olmesartan 20 mg.  I scheduled him for DC cardioversion but because of the need to obtain a COVID test I was unable to schedule this to be done by me prior to going on vacation.  He ultimately underwent the successful cardioversion by Dr. Davina Poke successfully and received 1 shock of 200 J of biphasic synchronized rhythm with restoration of sinus rhythm.  Chemistry profile done prior to the cardioversion revealed his creatinine had increased to 1.69 and as result I recommended he reduce his olmesartan down to 10 mg from his dose of 20 mg.  I saw him on October 10, 2018 in follow-up of his cardioversion.  At that time he was maintaining sinus rhythm and feeling well.  He had more energy.  His stress level had significantly reduced since his wife had successful L5-S1 back surgery by Dr. Vertell Limber.  He was continuing to use CPAP with 100% compliance.    I last evaluated him on January 09, 2019.  At that time he stated that over the 3 months previous he had continued to feel well and remained asymptomatic.  At times there are still periods of increased stress.  He continued to use CPAP with 100% compliance.  A download was obtained from November 22 through January 08, 2019 which showed 100% compliance.  Average usage was 6 hours 47 minutes.  AHI is excellent at 1.4 with his AutoSet CPAP minimum pressure set at 8 and maximum of 20, with  95th percentile pressure 10.2 with maximum average pressure 12.0.  During his evaluation, his ECG verify that he was back in atrial fibrillation.  At the time, he did not inform me that he had noted some irregularity but retrospectively this may have been going on for several weeks prior to that evaluation.  He was continuing to walk daily and exercising on his bike and was asymptomatic without shortness of breath.  During that evaluation I had a long discussion with him regarding possible EP evaluation for consideration of ablation.  After his significant discussion elected to initiate an attempt at antiarrhythmic therapy with low-dose flecainide initially at 50 mg twice a day with plans for office visit in 2 weeks.  He has continued to be on Eliquis for anticoagulation in addition to Zetia for his hyperlipidemia.  When I saw him on January 24, 2019 he stated that he had felt some fatigue once flecainide was instituted.  He was feeling well but this past Sunday he had been working very hard going up and down numerous steps carrying boxes with heavy exertion making at least 40 trips carrying Christmas girls back upstairs.  He was tired.  Sunday evening while sitting down he became disoriented anxious and it appeared that there was possibly some transient stress of aphasia.  He denied any focal weakness.  His symptoms ultimately resolved after 30 minutes.  He had called the office during the workweek and was concerned about his symptoms possibly secondary to flecainide.  He had self reduced his flecainide dose to just once a day rather than twice a day and is worked into my schedule today for follow-up evaluation.  Presently, he has no residual issues since that event several days ago.    At his January 2021 evaluation, I recommended he undergo a neurologic evaluation with Dr. Leonie Man as well as discussed an EP evaluation for possible consideration of future atrial fibrillation ablation.  He saw Dr. Curt Bears on  February 08, 2019 and  was in persistent atrial fibrillation.  Was discussion concerning continuing flecainide with subsequent high voltage cardioversion.  He saw Dr. Leonie Man February 27, 2019 and after a long evaluation it was felt that his transient episode of word finding difficulty and anomia likely represented mild cognitive impairment rather than TIA or seizure.  He subsequently saw Dr Curt Bears back on March 05, 2019 and on 323 he underwent successful cardioversion with Dr. Oval Linsey which required 3 shocks with restoration of sinus rhythm.  Presently, Mr. Kohlmann feels his heart rhythm is stable.  He has been under considerable increased stress to his wife had fallen in sustained a fracture to her humerus as well as tear to her rotator cuff and biceps tendon.  Ultimately underwent surgery.  He denies chest pain PND orthopnea.  He has continued to use CPAP download from March 05, 2019 through April 03, 2019 continues to show excellent compliance with an AHI of 2.7 and 95th percentile pressure 11.4 cm.  Presents for reevaluation.  Past Medical History:  Diagnosis Date  . Arthritis    hips   . Atrial fibrillation ([place])   . Cataract   . CPAP (continuous positive airway pressure) dependence   . Dysrhythmia    PAF- hx of 7 years ago   . HLD (hyperlipidemia)   . Hypertension 05/29/10   ECHO-EF 67% wnl  . Meniere disease   . Obesity   . Palpitations   . Sleep apnea 09/14/04   [facility]- Dr. Humphrey Rolls; CPAP titration 11/12/04- Dr. Humphrey Rolls  . Stroke [facility])     Past Surgical History:  Procedure Laterality Date  . CARDIAC CATHETERIZATION  11/03/05  . CARDIOVERSION N/A 09/26/2018   Procedure: CARDIOVERSION;  Surgeon: Geralynn Rile, MD;  Location: [place];  Service: Endoscopy;  Laterality: N/A;  . CARDIOVERSION N/A 03/13/2019   Procedure: CARDIOVERSION;  Surgeon: Skeet Latch, MD;  Location: [place];  Service: Cardiovascular;  Laterality: N/A;  . CATARACT EXTRACTION   2014  . KNEE ARTHROSCOPY     left knee- 2002   . TOTAL HIP ARTHROPLASTY  06/08/2011   Procedure: TOTAL HIP ARTHROPLASTY ANTERIOR APPROACH;  Surgeon: [name], MD;  Location: [facility];  Service: Orthopedics;  Laterality: Left;    Allergies  Allergen Reactions  . Rosuvastatin     Other reaction(s): GI Upset (intolerance) Crestor - Intestinal issues   . Statins Other (See Comments)    Crestor - Intestinal issues     Current Outpatient Medications  Medication Sig Dispense Refill  . Ascorbic Acid (VITAMIN C) 1000 MG tablet Take 1,000 mg by mouth daily.     Marland Kitchen aspirin EC 81 MG tablet Take 1 tablet (81 mg total) by mouth daily. 90 tablet 3  . [place]-Glucosamine-Vit D (GLUCOSAMINE COMPLEX PO) Take 3 tablets by mouth daily.     . Coenzyme Q10 200 MG capsule Take 200 mg by mouth daily.     Marland Kitchen ELIQUIS 5 MG TABS tablet TAKE 1 TABLET BY MOUTH TWICE A DAY (Patient taking differently: Take 5 mg by mouth 2 (two) times daily. ) 180 tablet 2  . ezetimibe (ZETIA) 10 MG tablet TAKE 1 TABLET BY MOUTH EVERY DAY (Patient taking differently: Take 10 mg by mouth daily. ) 90 tablet 3  . flecainide (TAMBOCOR) 100 MG tablet Take 1 tablet (100 mg total) by mouth 2 (two) times daily. 60 tablet 6  . LIVALO 2 MG TABS TAKE 1 TABLET BY MOUTH EVERY DAY (Patient taking differently: Take 2 mg by  mouth daily. ) 90 tablet 3  . metFORMIN (GLUCOPHAGE) 500 MG tablet Take 1 tablet (500 mg total) by mouth daily with breakfast. 30 tablet 0  . metoprolol succinate (TOPROL-XL) 100 MG 24 hr tablet Take 100 mg in the evenings (along with 25 mg or 50 mg in the mornings) (Patient taking differently: Take 100 mg by mouth at bedtime. ) 90 tablet 3  . Multiple Vitamin (MULITIVITAMIN WITH MINERALS) TABS Take 1 tablet by mouth daily.     Marland Kitchen olmesartan (BENICAR) 20 MG tablet TAKE 1 TABLET BY MOUTH EVERY DAY (Patient taking differently: Take 20 mg by mouth daily. ) 90 tablet 2  . Omega 3-6-9 Fatty Acids (OMEGA-3-6-9 PO) Take 3 capsules by  mouth every morning. Omega 3= 870m,omega 6=3865momega 9 =30069m  . spironolactone (ALDACTONE) 25 MG tablet TAKE 1/2 TABLET BY MOUTH EVERY DAY (Patient taking differently: Take 12.5 mg by mouth daily. ) 45 tablet 3  . tamsulosin (FLOMAX) 0.4 MG CAPS capsule Take 0.4 mg by mouth daily.    . tMarland Kitcheniamcinolone cream (KENALOG) 0.5 % APPLY 1 APPLICATION TOPICALLY 2 (TWO) TIMES DAILY. TO AFFECTED AREAS. 30 g 3  . zinc gluconate 50 MG tablet Take 50 mg by mouth daily.     . dMarland Kitchenltiazem (CARDIZEM CD) 180 MG 24 hr capsule Take 1 capsule (180 mg total) by mouth daily. 90 capsule 3   No current facility-administered medications for this visit.    Socially he is remarried after his first wife passed away. He has 2 children. 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 is no tobacco history.  He plays golf at least 2 days/week and this past year not only did he have a hole in 1 but he has also shot his age.  ROS General: Negative; No fevers, chills, or night sweats;  HEENT: Negative; No changes in vision or hearing, sinus congestion, difficulty swallowing Pulmonary: Negative; No cough, wheezing, shortness of breath, hemoptysis Cardiovascular: See HPI GI: Negative; No nausea, vomiting, diarrhea, or abdominal pain GU: Negative; No dysuria, hematuria, or difficulty voiding Musculoskeletal: He is status post rib fracture.  He is status post hip replacement. Hematologic/Oncology: Negative; no easy bruising, bleeding Endocrine: Negative; no heat/cold intolerance; no diabetes Neuro: No vertigo or imbalance issues Skin: Negative; No rashes or skin lesions Psychiatric: Negative; No behavioral problems, depression Sleep: Positive for obstructive sleep apnea on CPAP therapy with 100% compliance No snoring, daytime sleepiness, hypersomnolence, bruxism, restless legs, hypnogognic hallucinations, no cataplexy Other comprehensive 14 point system review is negative.  PE BP 130/82 (BP Location:  Left Arm, Patient Position: Sitting, Cuff Size: Large)   Pulse (!) 59   Temp (!) 97.3 F (36.3 C)   Ht 6' 3"  (1.905 m)   Wt 238 lb (108 kg)   BMI 29.75 kg/m    Repeat blood pressure by me was 146/80.  States his blood pressure at home has been running in the [id number]  Wt Readings from Last 3 Encounters:  04/04/19 238 lb (108 kg)  03/27/19 242 lb (109.8 kg)  03/13/19 232 lb (105.2 kg)  He  has lost 40 pounds over the past year.   General: Alert, oriented, no distress.  Skin: normal turgor, no rashes, warm and dry HEENT: Normocephalic, atraumatic. Pupils equal round and reactive to light; sclera anicteric; extraocular muscles intact;  Nose without nasal septal hypertrophy Mouth/Parynx benign; Mallinpatti scale 3 Neck: No JVD, no carotid bruits; normal carotid upstroke Lungs: clear to ausculatation  and percussion; no wheezing or rales Chest wall: without tenderness to palpitation Heart: PMI not displaced, RRR, s1 s2 normal, 1/6 systolic murmur, no diastolic murmur, no rubs, gallops, thrills, or heaves Abdomen: soft, nontender; no hepatosplenomehaly, BS+; abdominal aorta nontender and not dilated by palpation. Back: no CVA tenderness Pulses 2+ Musculoskeletal: full range of motion, normal strength, no joint deformities Extremities: no clubbing cyanosis or edema, Homan's sign negative  Neurologic: grossly nonfocal; Cranial nerves grossly wnl Psychologic: Normal mood and affect   ECG (independently read by me): Sinus bradycardia 59 bpm, first-degree AV block with a PR of 248 ms.  LVH.  Inferior Q waves, unchanged  January 24, 2019 ECG (independently read by me): Atrial fibrillation at 83 bpm.  Borderline LVH by voltage criteria in aVL; QTc interval 446 ms.  January 09, 2019 ECG (independently read by me): Atrial fibrillation ar 97, RBBB; Left axis deviation; Inferior Q waves, unchanged. QTc 487    August 09, 2018 ECG (independently read by me): Normal sinus rhythm  at 72 bpm.  Left Axis deviation.  QTc interval 429 ms.  September 15, 2018 ECG (independently read by me): Atrial fibrillation at 92 bpm.  Right bundle branch block with repolarization changes.  August 23, 2018 ECG (independently read by me): Atrial fibrillation at 96 bpm.  LVH with repolarization changes.  Left axis deviation.  Left anterior hemiblock.  Inferior Q waves  August 09, 2018 ECG (independently read by me):Atrial fibrillatoion at 10; LVH with repolarization  October 219 ECG (independently read by me): Normal sinus rhythm at 81 bpm.  Nonspecific intraventricular conduction delay.  Left axis deviation  September 2018 ECG (independently read by me): Normal sinus rhythm at 63 bpm.  Left bundle branch block with repolarization changes.  Normal intervals.  No ectopy.  April 2018 ECG (independently read by me): Normal sinus rhythm at 68 bpm.  LVH with repolarization.  Normal intervals.  November 2017 ECG (independently read by me): Normal sinus rhythm at 78 bpm.  Nonspecific interventricular delay.  QRS complex V1 V2.  August 2016 ECG (independently read by me): Normal sinus rhythm at 77 bpm.  LVH with repolarization changes.  Left axis deviation.  December 2015 ECG (independently read by me): Normal sinus rhythm at 87 bpm.  LVH by voltage criteria with T-wave changes in leads 1 and aVL.  July 2014 ECG: Normal sinus rhythm at 76 beats per minute. LVH with QRS widening.  LABS:  BMP Latest Ref Rng & Units 03/13/2019 03/05/2019 01/09/2019  Glucose 70 - 99 mg/dL 121(H) 120(H) 96  BUN 8 - 23 mg/dL 30(H) 22 30(H)  Creatinine 0.61 - 1.24 mg/dL 1.50(H) 1.35(H) 1.31(H)  BUN/Creat Ratio 10 - 24 - 16 23  Sodium 135 - 145 mmol/L 137 138 142  Potassium 3.5 - 5.1 mmol/L 4.5 4.8 4.8  Chloride 98 - 111 mmol/L 105 102 106  CO2 20 - 29 mmol/L - 23 21  Calcium 8.6 - 10.2 mg/dL - 10.1 10.0   Hepatic Function Latest Ref Rng & Units 01/09/2019 01/05/2019 09/22/2018  Total Protein 6.0 - 8.5 g/dL 6.6 6.7 6.7   Albumin 3.7 - 4.7 g/dL 4.5 4.6 4.8(H)  AST 0 - 40 IU/L 30 26 27   ALT 0 - 44 IU/L 31 31 27   Alk Phosphatase 39 - 117 IU/L 64 65 52  Total Bilirubin 0.0 - 1.2 mg/dL 0.8 0.7 1.4(H)   CBC Latest Ref Rng & Units 03/13/2019 03/05/2019 09/22/2018  WBC 3.4 - 10.8 x10E3/uL - 4.8 6.1  Hemoglobin 13.0 - 17.0 g/dL 15.6 15.4 15.4  Hematocrit 39.0 - 52.0 % 46.0 45.3 46.2  Platelets 150 - 450 x10E3/uL - 168 187   Lab Results  Component Value Date   MCV 90 03/05/2019   MCV 95 09/22/2018   MCV 92 08/09/2018   Lab Results  Component Value Date   TSH 1.290 08/09/2018    Lab Results  Component Value Date   HGBA1C 5.8 (H) 09/04/2018    Lipid Panel     Component Value Date/Time   CHOL 114 01/05/2019 1003   TRIG 39 01/05/2019 1003   HDL 57 01/05/2019 1003   CHOLHDL 2.0 01/05/2019 1003   CHOLHDL 3.8 10/12/2016 0902   VLDL 17 04/14/2016 0937   LDLCALC 47 01/05/2019 1003   LDLCALC 119 (H) 10/12/2016 0902     RADIOLOGY: No results found.  IMPRESSION:  1. Paroxysmal atrial fibrillation (HCC)   2. Essential hypertension   3. Anticoagulated   4. OSA on CPAP        ASSESSMENT AND PLAN: Mr Bollig is a 74 year old gentleman who has a long-standing history of hypertension and had been on amlodipine/benazepril 10/40, HCTZ 25 mg, Toprol-XL 100 mg in addition to spironolactone 12.5 mg daily.  He had had a prior isolated episode of atrial fibrillation in 2007 and had undergone cardioversion.  On this regimen his blood pressure had stabilized and consistently was well controlled and he had not had any recurrent episodes of atrial fibrillation.   He had gained significant amount of weight since August 2019.  He initiated a major lifestyle change and has been working closely with Dr. Leafy Ro from [facility] resulting in a greater than 40 pound weight loss over the past year.  When I saw him in late July 2020 he was in atrial fibrillation of questionable duration.  At the time  retrospectively he believes he may have been in A. fib for 3 to 4 months duration.  I gradually titrated his medical regimen and changed his benazepril to diltiazem and olmesartan to take in addition to metoprolol succinate and his spironolactone 12.5 mg daily.  He had continued to use CPAP with 100% compliance.  His only previous episode of A. fib prior to this episode had occurred in 2007 and he had maintained sinus rhythm until this past year.  He underwent successful cardioversion on September 26, 2018 with restoration of sinus rhythm.  When I saw him on December 22 he was continuing to admit to having periods of increased stress.  At that time, his ECG showed that he was back in atrial fibrillation of questionable duration.  In retrospect, he believes that he may have been in atrial fibrillation for 3 to 4 weeks prior to that December evaluation and was hopeful that reducing his stress would rectify the problem.  He had continue to use CPAP with 100% compliance and was sleeping well.  Flecainide at 50 mg twice a day was started on January 09, 2019.  January 3, he admitted to pretty significant heavy exertion carrying objects up and down steps with at least 40 trips.  He then developed an episode of disorientation increased anxiety with possible transient expressive aphasia without focal hemiparesis.  His symptoms lasted approximately 30 minutes and self resolved.  Thoroughly reviewed his evaluations with Dr. Leonie Man and also his EP evaluation with Dr. Curt Bears.  He was not felt to have a TIA or stroke but rather transient episode of word finding difficulty and  anomia most likely represented mild cognitive impairment.  Was recommended that he increase participation in cognitively challenging activities.  He has been on flecainide 100 mg twice a day underwent successful reversion requiring 3 shocks with restoration of sinus rhythm.  His ECG today confirms he is maintaining sinus rhythm and has first-degree AV block.   He does not have any anginal symptomatology.  He has continued to use CPAP with excellent compliance and most recent AHI is excellent at 2.7.  He has continued to see Dr. Leafy Ro at [facility] has continued with weight loss and is now under the obesity threshold.  I recommended that he monitor his blood pressure closely at home.  He continues to be on diltiazem 180 mg, Toprol-XL 100 mg, olmesartan 20 mg, spironolactone 12.5 mg.  He continues to be anticoagulated without bleeding on Eliquis.  I will see him in 4 months for follow-up evaluation or sooner if problems arise.   [name], MD, [facility]  04/06/2019 8:17 AM

## 2019-04-04 NOTE — Patient Instructions (Signed)
Medication Instructions:  °CONTINUE WITH CURRENT MEDICATIONS. NO CHANGES. ° °*If you need a refill on your cardiac medications before your next appointment, please call your pharmacy* ° °Follow-Up: °At [facility], you and your health needs are our priority.  As part of our continuing mission to provide you with exceptional heart care, we have created designated Provider Care Teams.  These Care Teams include your primary Cardiologist (physician) and Advanced Practice Providers (APPs -  Physician Assistants and Nurse Practitioners) who all work together to provide you with the care you need, when you need it. ° °We recommend signing up for the patient portal called "MyChart".  Sign up information is provided on this After Visit Summary.  MyChart is used to connect with patients for Virtual Visits (Telemedicine).  Patients are able to view lab/test results, encounter notes, upcoming appointments, etc.  Non-urgent messages can be sent to your provider as well.   °To learn more about what you can do with MyChart, go to [email or web address].   ° °Your next appointment:   °3-4 month(s) ° °The format for your next appointment:   °In Person ° °Provider:   °Thomas Kelly, MD ° ° ° ° °

## 2019-04-06 ENCOUNTER — Encounter: Payer: Self-pay | Admitting: Cardiovascular Disease

## 2019-04-11 ENCOUNTER — Encounter (INDEPENDENT_AMBULATORY_CARE_PROVIDER_SITE_OTHER): Payer: Self-pay | Admitting: Family Medicine

## 2019-04-11 ENCOUNTER — Telehealth (INDEPENDENT_AMBULATORY_CARE_PROVIDER_SITE_OTHER): Payer: Medicare PPO | Admitting: Family Medicine

## 2019-04-11 ENCOUNTER — Other Ambulatory Visit: Payer: Self-pay

## 2019-04-11 DIAGNOSIS — E669 Obesity, unspecified: Secondary | ICD-10-CM

## 2019-04-11 DIAGNOSIS — Z6830 Body mass index (BMI) 30.0-30.9, adult: Secondary | ICD-10-CM | POA: Diagnosis not present

## 2019-04-11 DIAGNOSIS — R7303 Prediabetes: Secondary | ICD-10-CM | POA: Diagnosis not present

## 2019-04-11 MED ORDER — METFORMIN HCL 500 MG PO TABS: 500.0000 mg | ORAL_TABLET | Freq: Every day | ORAL | 0 refills | Status: DC

## 2019-04-11 NOTE — Progress Notes (Signed)
TeleHealth Visit:  Due to the COVID-19 pandemic, this visit was completed with telemedicine (audio/video) technology to reduce patient and provider exposure as well as to preserve personal protective equipment.   Clayton [name] has verbally consented to this TeleHealth visit. The patient is located at home, the provider is located at the [facility] office. The participants in this visit include the listed provider and patient. Clayton [name] was unable to use realtime audiovisual technology today and the telehealth visit was conducted via telephone.   Chief Complaint: OBESITY Clayton [name] is here to discuss his progress with his obesity treatment plan along with follow-up of his obesity related diagnoses. Clayton [name] is on following a lower carbohydrate, vegetable and lean protein rich diet plan and states he is following his eating plan approximately 75-80% of the time. Clayton [name] states he is walking and on the nordiac bike for 90 minutes 6 times per week.  Today's visit was #: 18 Starting weight: 266 lbs Starting date: 11/01/2017  Interim History: Clayton [name] has done well with maintaining his weight. His health has improved and his aFib is in remission after his second cardioversion. He is active daily and his goal is to lose another 10-15 lbs.  Subjective:   1. Pre-diabetes Clayton [name] is stable on metformin, and he has normal renal function. He is doing well on his diet, and he denies hypoglycemia.  Assessment/Plan:   1. Pre-diabetes Clayton [name] will continue to work on weight loss, diet, exercise, and decreasing simple carbohydrates to help decrease the risk of diabetes. We will refill metformin for 1 month, and we will recheck labs at his next in office visit.  - metFORMIN (GLUCOPHAGE) 500 MG tablet; Take 1 tablet (500 mg total) by mouth daily with breakfast.  Dispense: 30 tablet; Refill: 0  2. Class 1 obesity with serious comorbidity and body mass index (BMI) of 30.0 to 30.9 in adult, unspecified  obesity type Clayton [name] is currently in the action stage of change. As such, his goal is to continue with weight loss efforts. He has agreed to following a lower carbohydrate, vegetable and lean protein rich diet plan.   Exercise goals: As is.  Behavioral modification strategies: increasing lean protein intake.  Clayton [name] has agreed to follow-up with our clinic in 3 weeks with myself. He was informed of the importance of frequent follow-up visits to maximize his success with intensive lifestyle modifications for his multiple health conditions.  Objective:   VITALS: Per patient if applicable, see vitals. GENERAL: Alert and in no acute distress. CARDIOPULMONARY: No increased WOB. Speaking in clear sentences.  PSYCH: Pleasant and cooperative. Speech normal rate and rhythm. Affect is appropriate. Insight and judgement are appropriate. Attention is focused, linear, and appropriate.  NEURO: Oriented as arrived to appointment on time with no prompting.   Lab Results  Component Value Date   CREATININE 1.50 (H) 03/13/2019   BUN 30 (H) 03/13/2019   NA 137 03/13/2019   K 4.5 03/13/2019   CL 105 03/13/2019   CO2 23 03/05/2019   Lab Results  Component Value Date   ALT 31 01/09/2019   AST 30 01/09/2019   ALKPHOS 64 01/09/2019   BILITOT 0.8 01/09/2019   Lab Results  Component Value Date   HGBA1C 5.8 (H) 09/04/2018   HGBA1C 6.0 (H) 11/01/2017   HGBA1C 5.4 04/14/2016   HGBA1C 6.0 06/09/2015   Lab Results  Component Value Date   INSULIN 9.0 09/04/2018   INSULIN 22.4 11/01/2017   Lab Results  Component Value Date  TSH 1.290 08/09/2018   Lab Results  Component Value Date   CHOL 114 01/05/2019   HDL 57 01/05/2019   LDLCALC 47 01/05/2019   TRIG 39 01/05/2019   CHOLHDL 2.0 01/05/2019   Lab Results  Component Value Date   WBC 4.8 03/05/2019   HGB 15.6 03/13/2019   HCT 46.0 03/13/2019   MCV 90 03/05/2019   PLT 168 03/05/2019   No results found for: IRON, TIBC,  FERRITIN  Attestation Statements:   Reviewed by clinician on day of visit: allergies, medications, problem list, medical history, surgical history, family history, social history, and previous encounter notes.  Time spent on visit including pre-visit chart review and post-visit charting and care was 30 minutes.    I, Trixie Dredge, am acting as transcriptionist for Dennard Nip, MD.  I have reviewed the above documentation for accuracy and completeness, and I agree with the above. - Dennard Nip, MD

## 2019-04-17 ENCOUNTER — Ambulatory Visit: Payer: Medicare PPO | Admitting: Family Medicine

## 2019-04-18 ENCOUNTER — Ambulatory Visit (INDEPENDENT_AMBULATORY_CARE_PROVIDER_SITE_OTHER): Payer: Medicare PPO

## 2019-04-18 ENCOUNTER — Other Ambulatory Visit: Payer: Self-pay

## 2019-04-18 ENCOUNTER — Ambulatory Visit: Payer: Medicare PPO | Admitting: Family Medicine

## 2019-04-18 ENCOUNTER — Other Ambulatory Visit (INDEPENDENT_AMBULATORY_CARE_PROVIDER_SITE_OTHER): Payer: Self-pay | Admitting: Family Medicine

## 2019-04-18 ENCOUNTER — Encounter: Payer: Self-pay | Admitting: Family Medicine

## 2019-04-18 VITALS — BP 122/76 | HR 86 | Ht 75.0 in | Wt 242.0 lb

## 2019-04-18 DIAGNOSIS — G8929 Other chronic pain: Secondary | ICD-10-CM | POA: Diagnosis not present

## 2019-04-18 DIAGNOSIS — M25562 Pain in left knee: Secondary | ICD-10-CM

## 2019-04-18 DIAGNOSIS — M1712 Unilateral primary osteoarthritis, left knee: Secondary | ICD-10-CM | POA: Diagnosis not present

## 2019-04-18 DIAGNOSIS — M25462 Effusion, left knee: Secondary | ICD-10-CM | POA: Diagnosis not present

## 2019-04-18 DIAGNOSIS — R7303 Prediabetes: Secondary | ICD-10-CM

## 2019-04-18 NOTE — Patient Instructions (Signed)
Injected knee today Will get approval Custom knee brace See me 5-6 weeks

## 2019-04-18 NOTE — Progress Notes (Signed)
[facility] [place] Phone: [phone] Subjective:   Clayton [name], am serving as a scribe for Dr. Hulan Saas. This visit occurred during the SARS-CoV-2 public health emergency.  Safety protocols were in place, including screening questions prior to the visit, additional usage of staff PPE, and extensive cleaning of exam room while observing appropriate contact time as indicated for disinfecting solutions.    I'm seeing this patient by the request  of:  Kuneff, Renee A, DO  CC: Left knee pain  [id number]  Clayton [name] is a 74 y.o. male coming in with complaint of left knee pain. Last seen for OMT on 03/27/2019. History of knee scope 2002. Patient was told he has arthritis after the surgery. Six months ago patient developed pain in posterior aspect of knee. Feels weaker on left vs right. Pain occurs after activity.   Patient notices his pain the most getting in and out of the vehicle.      Past Medical History:  Diagnosis Date  . Arthritis    hips   . Atrial fibrillation ([place])   . Cataract   . CPAP (continuous positive airway pressure) dependence   . Dysrhythmia    PAF- hx of 7 years ago   . HLD (hyperlipidemia)   . Hypertension 05/29/10   ECHO-EF 67% wnl  . Meniere disease   . Obesity   . Palpitations   . Sleep apnea 09/14/04   [facility]- Dr. Humphrey Rolls; CPAP titration 11/12/04- Dr. Humphrey Rolls  . Stroke [facility])    Past Surgical History:  Procedure Laterality Date  . CARDIAC CATHETERIZATION  11/03/05  . CARDIOVERSION N/A 09/26/2018   Procedure: CARDIOVERSION;  Surgeon: Geralynn Rile, MD;  Location: [place];  Service: Endoscopy;  Laterality: N/A;  . CARDIOVERSION N/A 03/13/2019   Procedure: CARDIOVERSION;  Surgeon: Skeet Latch, MD;  Location: [place];  Service: Cardiovascular;  Laterality: N/A;  . CATARACT EXTRACTION  2014  . KNEE ARTHROSCOPY     left knee- 2002   . TOTAL  HIP ARTHROPLASTY  06/08/2011   Procedure: TOTAL HIP ARTHROPLASTY ANTERIOR APPROACH;  Surgeon: [name], MD;  Location: [facility];  Service: Orthopedics;  Laterality: Left;   Social History   Socioeconomic History  . Marital status: Married    Spouse name: Kannan Pamintuan  . Number of children: Not on file  . Years of education: Not on file  . Highest education level: Not on file  Occupational History  . Occupation: Retired  Tobacco Use  . Smoking status: Never Smoker  . Smokeless tobacco: Never Used  Substance and Sexual Activity  . Alcohol use: [name]  . Drug use: [name]  . Sexual activity: Yes    Birth control/protection: None  Other Topics Concern  . Not on file  Social History Narrative   Married. [name].Takes care of his mother-in-law as well.    Retired, Conservator, museum/gallery.    Drinks caffeine beverages. Takes a daily vitamin.   Exercises routinely.    Smoke detector in the home.    Ambulates independently.    Social Determinants of Health   Financial Resource Strain:   . Difficulty of Paying Living Expenses:   Food Insecurity:   . Worried About Charity fundraiser in the Last Year:   . Arboriculturist in the Last Year:   Transportation Needs:   . Film/video editor (Medical):   Marland Kitchen Lack of Transportation (Non-Medical):  Physical Activity:   . Days of Exercise per Week:   . Minutes of Exercise per Session:   Stress:   . Feeling of Stress :   Social Connections:   . Frequency of Communication with Friends and Family:   . Frequency of Social Gatherings with Friends and Family:   . Attends Religious Services:   . Active Member of Clubs or Organizations:   . Attends Archivist Meetings:   Marland Kitchen Marital Status:    Allergies  Allergen Reactions  . Rosuvastatin     Other reaction(s): GI Upset (intolerance) Crestor - Intestinal issues   . Statins Other (See Comments)    Crestor - Intestinal issues    Family History  Problem Relation Age of Onset  .  Hypertension Mother   . Stroke Mother   . Diabetes Mother   . Hyperlipidemia Mother   . Obesity Mother   . Diabetes Sister   . Stroke Maternal Grandmother        44 died   . Heart disease Maternal Grandfather   . Hypertension Maternal Grandfather   . Diabetes Maternal Grandfather   . Arthritis Father   . Heart disease Father   . Heart disease Paternal Grandfather     Current Outpatient Medications (Endocrine & Metabolic):  .  metFORMIN (GLUCOPHAGE) 500 MG tablet, Take 1 tablet (500 mg total) by mouth daily with breakfast.  Current Outpatient Medications (Cardiovascular):  .  ezetimibe (ZETIA) 10 MG tablet, TAKE 1 TABLET BY MOUTH EVERY DAY (Patient taking differently: Take 10 mg by mouth daily. ) .  flecainide (TAMBOCOR) 100 MG tablet, Take 1 tablet (100 mg total) by mouth 2 (two) times daily. Marland Kitchen  LIVALO 2 MG TABS, TAKE 1 TABLET BY MOUTH EVERY DAY (Patient taking differently: Take 2 mg by mouth daily. ) .  metoprolol succinate (TOPROL-XL) 100 MG 24 hr tablet, Take 100 mg in the evenings (along with 25 mg or 50 mg in the mornings) (Patient taking differently: Take 100 mg by mouth at bedtime. ) .  olmesartan (BENICAR) 20 MG tablet, TAKE 1 TABLET BY MOUTH EVERY DAY (Patient taking differently: Take 20 mg by mouth daily. ) .  spironolactone (ALDACTONE) 25 MG tablet, TAKE 1/2 TABLET BY MOUTH EVERY DAY (Patient taking differently: Take 12.5 mg by mouth daily. ) .  diltiazem (CARDIZEM CD) 180 MG 24 hr capsule, Take 1 capsule (180 mg total) by mouth daily.   Current Outpatient Medications (Analgesics):  .  aspirin EC 81 MG tablet, Take 1 tablet (81 mg total) by mouth daily.  Current Outpatient Medications (Hematological):  Marland Kitchen  ELIQUIS 5 MG TABS tablet, TAKE 1 TABLET BY MOUTH TWICE A DAY (Patient taking differently: Take 5 mg by mouth 2 (two) times daily. )  Current Outpatient Medications (Other):  Marland Kitchen  Ascorbic Acid (VITAMIN C) 1000 MG tablet, Take 1,000 mg by mouth daily.  .   Boswellia-Glucosamine-Vit D (GLUCOSAMINE COMPLEX PO), Take 3 tablets by mouth daily.  .  Coenzyme Q10 200 MG capsule, Take 200 mg by mouth daily.  .  Multiple Vitamin (MULITIVITAMIN WITH MINERALS) TABS, Take 1 tablet by mouth daily.  [name] 3-6-9 Fatty Acids (OMEGA-3-6-9 PO), Take 3 capsules by mouth every morning. Omega 3= 800mg ,omega 6=380mg ,omega 9 =300mg  .  tamsulosin (FLOMAX) 0.4 MG CAPS capsule, Take 0.4 mg by mouth daily. Marland Kitchen  triamcinolone cream (KENALOG) 0.5 %, APPLY 1 APPLICATION TOPICALLY 2 (TWO) TIMES DAILY. TO AFFECTED AREAS. Marland Kitchen  zinc gluconate 50 MG tablet, Take  50 mg by mouth daily.    Reviewed prior external information including notes and imaging from  primary care provider As well as notes that were available from care everywhere and other healthcare systems.  Past medical history, social, surgical and family history all reviewed in electronic medical record.  [name] pertanent information unless stated regarding to the chief complaint.   Review of Systems:  [name] headache, visual changes, nausea, vomiting, diarrhea, constipation, dizziness, abdominal pain, skin rash, fevers, chills, night sweats, weight loss, swollen lymph nodes, body aches, joint swelling, chest pain, shortness of breath, mood changes. POSITIVE muscle aches  Objective  Blood pressure 122/76, pulse 86, height 6\' 3"  (1.905 m), weight 242 lb (109.8 kg), SpO2 98 %.   General: [name] apparent distress alert and oriented x3 mood and affect normal, dressed appropriately.  HEENT: Pupils equal, extraocular movements intact  Respiratory: Patient's speak in full sentences and does not appear short of breath  Cardiovascular: [name] lower extremity edema, non tender, [name] erythema  Neuro: Cranial nerves II through XII are intact, neurovascularly intact in all extremities with 2+ DTRs and 2+ pulses.  Gait mild antalgic Knee: Left valgus deformity noted.  Abnormal thigh to calf ratio.  Tender to palpation over medial and PF joint  line.  ROM full in flexion and extension and lower leg rotation. instability with valgus force.  painful patellar compression. Patellar glide with moderate crepitus. Patellar and quadriceps tendons unremarkable. Hamstring and quadriceps strength is normal. Contralateral knee shows very mild arthritic changes  Limited musculoskeletal ultrasound was performed and interpreted by Bradley u/s shows the patient does have severe medial compartment arthritic changes.  Patient does have what appears to be medial and lateral meniscal removal already noted.  Patient does have a trace effusion noted of the patellofemoral joint noted. Impression: Knee arthritis  After informed written and verbal consent, patient was seated on exam table. Left knee was prepped with alcohol swab and utilizing anterolateral approach, patient's left knee space was injected with 4:1  marcaine 0.5%: Kenalog 40mg /dL. Patient tolerated the procedure well without immediate complications.   Impression and Recommendations:     This case required medical decision making of moderate complexity. The above documentation has been reviewed and is accurate and complete [name], DO       Note: This dictation was prepared with Dragon dictation along with smaller phrase technology. Any transcriptional errors that result from this process are unintentional.

## 2019-04-18 NOTE — Assessment & Plan Note (Signed)
Patient given injection today, tolerated the procedure well, discussed icing regimen and home exercise, which activities to do which wants to avoid.  Increase activity as tolerated.  Patient could be a candidate for viscosupplementation.  Due to the instability of the knee and abnormal calf to thigh ratio I do believe a custom brace is necessary in either an OA stability versus medial unloader.  Follow-up with me again in 4 to 6 weeks topical anti-inflammatories have been prescribed secondary to patient being on a blood thinner.

## 2019-04-28 DIAGNOSIS — M1712 Unilateral primary osteoarthritis, left knee: Secondary | ICD-10-CM | POA: Diagnosis not present

## 2019-05-07 ENCOUNTER — Other Ambulatory Visit: Payer: Self-pay

## 2019-05-07 ENCOUNTER — Ambulatory Visit (INDEPENDENT_AMBULATORY_CARE_PROVIDER_SITE_OTHER): Payer: Medicare PPO | Admitting: Family Medicine

## 2019-05-07 ENCOUNTER — Encounter (INDEPENDENT_AMBULATORY_CARE_PROVIDER_SITE_OTHER): Payer: Self-pay | Admitting: Family Medicine

## 2019-05-07 VITALS — BP 170/85 | HR 58 | Temp 98.0°F | Ht 75.0 in | Wt 245.0 lb

## 2019-05-07 DIAGNOSIS — I10 Essential (primary) hypertension: Secondary | ICD-10-CM | POA: Diagnosis not present

## 2019-05-07 DIAGNOSIS — R7303 Prediabetes: Secondary | ICD-10-CM | POA: Diagnosis not present

## 2019-05-07 DIAGNOSIS — E669 Obesity, unspecified: Secondary | ICD-10-CM

## 2019-05-07 DIAGNOSIS — Z6830 Body mass index (BMI) 30.0-30.9, adult: Secondary | ICD-10-CM

## 2019-05-08 NOTE — Progress Notes (Signed)
Chief Complaint:   OBESITY Clayton [name] is here to discuss his progress with his obesity treatment plan along with follow-up of his obesity related diagnoses. Clayton [name] is on following a lower carbohydrate, vegetable and lean protein rich diet plan and states he is following his eating plan approximately 0% of the time. Clayton [name] states he is doing 0 minutes 0 times per week.  Today's visit was #: 67 Starting weight: 266 lbs Starting date: 11/01/2017 Today's weight: 245 lbs Today's date: 05/07/2019 Total lbs lost to date: 21 Total lbs lost since last in-office visit: 0  Interim History: Clayton [name] has been off track in the last few weeks. Due to multiple stressful events. He was able to get back on track with his eating recently and he is ready to start journaling again.  Subjective:   1. Pre-diabetes Clayton [name] has been off track with his eating plan, but he has already gotten back on track this week. He is tolerating metformin well.  2. Essential hypertension Clayton [name]'s blood pressure this morning was 120/80, but he was agitated this morning with his blood pressure. He denies chest pain, and he is working on diet and weight loss.  Assessment/Plan:   1. Pre-diabetes Clayton [name] will continue to work on weight loss, exercise, and decreasing simple carbohydrates to help decrease the risk of diabetes. We will refill metformin for 1 month.  - metFORMIN (GLUCOPHAGE) 500 MG tablet; Take 1 tablet (500 mg total) by mouth daily with breakfast.  Dispense: 30 tablet; Refill: 0  2. Essential hypertension Clayton [name] will continue to work on diet, exercise, and healthy weight loss to improve blood pressure control. We will watch for signs of hypotension as he continues his lifestyle modifications. We will recheck his blood pressure in 1 month.  3. Class 1 obesity with serious comorbidity and body mass index (BMI) of 30.0 to 30.9 in adult, unspecified obesity type Clayton [name] is currently in the action stage of change. As  such, his goal is to continue with weight loss efforts. He has agreed to keeping a food journal and adhering to recommended goals of 1800 calories and 100 grams of protein daily.   Exercise goals: Clayton [name] is to work on getting back to his previous level of exercise.  Behavioral modification strategies: meal planning and cooking strategies and keeping a strict food journal.  Clayton [name] has agreed to follow-up with our clinic in 4 weeks. He was informed of the importance of frequent follow-up visits to maximize his success with intensive lifestyle modifications for his multiple health conditions.   Objective:   Blood pressure (!) 170/85, pulse (!) 58, temperature 98 F (36.7 C), temperature source Oral, height 6\' 3"  (1.905 m), weight 245 lb (111.1 kg), SpO2 98 %. Body mass index is 30.62 kg/m.  General: Cooperative, alert, well developed, in no acute distress. HEENT: Conjunctivae and lids unremarkable. Cardiovascular: Regular rhythm.  Lungs: Normal work of breathing. Neurologic: No focal deficits.   Lab Results  Component Value Date   CREATININE 1.50 (H) 03/13/2019   BUN 30 (H) 03/13/2019   NA 137 03/13/2019   K 4.5 03/13/2019   CL 105 03/13/2019   CO2 23 03/05/2019   Lab Results  Component Value Date   ALT 31 01/09/2019   AST 30 01/09/2019   ALKPHOS 64 01/09/2019   BILITOT 0.8 01/09/2019   Lab Results  Component Value Date   HGBA1C 5.8 (H) 09/04/2018   HGBA1C 6.0 (H) 11/01/2017   HGBA1C 5.4 04/14/2016   HGBA1C 6.0 06/09/2015  Lab Results  Component Value Date   INSULIN 9.0 09/04/2018   INSULIN 22.4 11/01/2017   Lab Results  Component Value Date   TSH 1.290 08/09/2018   Lab Results  Component Value Date   CHOL 114 01/05/2019   HDL 57 01/05/2019   LDLCALC 47 01/05/2019   TRIG 39 01/05/2019   CHOLHDL 2.0 01/05/2019   Lab Results  Component Value Date   WBC 4.8 03/05/2019   HGB 15.6 03/13/2019   HCT 46.0 03/13/2019   MCV 90 03/05/2019   PLT 168 03/05/2019    No results found for: IRON, TIBC, FERRITIN  Obesity Behavioral Intervention Documentation for Insurance:   Approximately 15 minutes were spent on the discussion below.  ASK: We discussed the diagnosis of obesity with Clayton [name] today and Clayton [name] agreed to give [name] permission to discuss obesity behavioral modification therapy today.  ASSESS: Clayton [name] has the diagnosis of obesity and his BMI today is 30.62. Clayton [name] is in the action stage of change.   ADVISE: Clayton [name] was educated on the multiple health risks of obesity as well as the benefit of weight loss to improve his health. He was advised of the need for long term treatment and the importance of lifestyle modifications to improve his current health and to decrease his risk of future health problems.  AGREE: Multiple dietary modification options and treatment options were discussed and Clayton [name] agreed to follow the recommendations documented in the above note.  ARRANGE: Clayton [name] was educated on the importance of frequent visits to treat obesity as outlined per CMS and USPSTF guidelines and agreed to schedule his next follow up appointment today.  Attestation Statements:   Reviewed by clinician on day of visit: allergies, medications, problem list, medical history, surgical history, family history, social history, and previous encounter notes.   I, Trixie Dredge, am acting as transcriptionist for Dennard Nip, MD.  I have reviewed the above documentation for accuracy and completeness, and I agree with the above. -  Dennard Nip, MD

## 2019-05-09 ENCOUNTER — Other Ambulatory Visit: Payer: Self-pay

## 2019-05-09 ENCOUNTER — Encounter: Payer: Self-pay | Admitting: Family Medicine

## 2019-05-09 ENCOUNTER — Ambulatory Visit: Payer: Medicare PPO | Admitting: Family Medicine

## 2019-05-09 VITALS — BP 128/82 | HR 60 | Ht 75.0 in | Wt 249.0 lb

## 2019-05-09 DIAGNOSIS — M1712 Unilateral primary osteoarthritis, left knee: Secondary | ICD-10-CM

## 2019-05-09 DIAGNOSIS — M545 Low back pain, unspecified: Secondary | ICD-10-CM

## 2019-05-09 DIAGNOSIS — M999 Biomechanical lesion, unspecified: Secondary | ICD-10-CM

## 2019-05-09 DIAGNOSIS — G8929 Other chronic pain: Secondary | ICD-10-CM

## 2019-05-09 MED ORDER — METFORMIN HCL 500 MG PO TABS: 500.0000 mg | ORAL_TABLET | Freq: Every day | ORAL | 0 refills | Status: DC

## 2019-05-09 NOTE — Assessment & Plan Note (Signed)
Chronic problem stable at the moment.  Discussed medication management with patient being on the blood thinner and avoiding oral anti-inflammatories.  Discussed icing regimen.  Increase activity slowly.  Follow-up again in 4 to 8 weeks.

## 2019-05-09 NOTE — Patient Instructions (Signed)
See me in 6-7 weeks 

## 2019-05-09 NOTE — Assessment & Plan Note (Signed)
   Decision today to treat with OMT was based on Physical Exam  After verbal consent patient was treated with HVLA, ME, FPR techniques in  thoracic, , lumbar and sacral areas, all areas are chronic   Patient tolerated the procedure well with improvement in symptoms  Patient given exercises, stretches and lifestyle modifications  See medications in patient instructions if given  Patient will follow up in 4-8 weeks

## 2019-05-09 NOTE — Progress Notes (Signed)
[place] Phone: [phone] Subjective:   [name], am serving as a scribe for Dr. Hulan Saas. This visit occurred during the SARS-CoV-2 public health emergency.  Safety protocols were in place, including screening questions prior to the visit, additional usage of staff PPE, and extensive cleaning of exam room while observing appropriate contact time as indicated for disinfecting solutions.   I'm seeing this patient by the request  of:  Kuneff, Renee A, DO  CC: Knee pain and back pain follow-up  [id number]   04/18/2019 Patient given injection today, tolerated the procedure well, discussed icing regimen and home exercise, which activities to do which wants to avoid.  Increase activity as tolerated.  Patient could be a candidate for viscosupplementation.  Due to the instability of the knee and abnormal calf to thigh ratio I do believe a custom brace is necessary in either an OA stability versus medial unloader.  Follow-up with me again in 4 to 6 weeks topical anti-inflammatories have been prescribed secondary to patient being on a blood thinner.  Update 05/09/2019 MING YOUNGERS is a 74 y.o. male coming in with complaint of back and left knee pain. Pain has subsided after steroid injection. Is wearing custom brace. Walked today and had less pain.  Patient states feeling 85 to 95% better.  Has not felt this good in years.  Happy with the results  Back pain is managed by OMT. No new issues.  Mild tightness but nothing significant.  No significant radiation down the legs or any numbness or tingling.       Past Medical History:  Diagnosis Date  . Arthritis    hips   . Atrial fibrillation ([place])   . Cataract   . CPAP (continuous positive airway pressure) dependence   . Dysrhythmia    PAF- hx of 7 years ago   . HLD (hyperlipidemia)   . Hypertension 05/29/10   ECHO-EF 67% wnl  . Meniere disease   .  Obesity   . Palpitations   . Sleep apnea 09/14/04   [facility]- Dr. Humphrey Rolls; CPAP titration 11/12/04- Dr. Humphrey Rolls  . Stroke [facility])    Past Surgical History:  Procedure Laterality Date  . CARDIAC CATHETERIZATION  11/03/05  . CARDIOVERSION N/A 09/26/2018   Procedure: CARDIOVERSION;  Surgeon: Geralynn Rile, MD;  Location: [place];  Service: Endoscopy;  Laterality: N/A;  . CARDIOVERSION N/A 03/13/2019   Procedure: CARDIOVERSION;  Surgeon: Skeet Latch, MD;  Location: [place];  Service: Cardiovascular;  Laterality: N/A;  . CATARACT EXTRACTION  2014  . KNEE ARTHROSCOPY     left knee- 2002   . TOTAL HIP ARTHROPLASTY  06/08/2011   Procedure: TOTAL HIP ARTHROPLASTY ANTERIOR APPROACH;  Surgeon: [name], MD;  Location: [facility];  Service: Orthopedics;  Laterality: Left;   Social History   Socioeconomic History  . Marital status: Married    Spouse name: Shaquilla Reese  . Number of children: Not on file  . Years of education: Not on file  . Highest education level: Not on file  Occupational History  . Occupation: Retired  Tobacco Use  . Smoking status: Never Smoker  . Smokeless tobacco: Never Used  Substance and Sexual Activity  . Alcohol use: No  . Drug use: No  . Sexual activity: Yes    Birth control/protection: None  Other Topics Concern  . Not on file  Social History Narrative   Married.  [name].Takes care of his mother-in-law as well.    Retired, Conservator, museum/gallery.    Drinks caffeine beverages. Takes a daily vitamin.   Exercises routinely.    Smoke detector in the home.    Ambulates independently.    Social Determinants of Health   Financial Resource Strain:   . Difficulty of Paying Living Expenses:   Food Insecurity:   . Worried About Charity fundraiser in the Last Year:   . Arboriculturist in the Last Year:   Transportation Needs:   . Film/video editor (Medical):   Marland Kitchen Lack of Transportation (Non-Medical):   Physical Activity:    . Days of Exercise per Week:   . Minutes of Exercise per Session:   Stress:   . Feeling of Stress :   Social Connections:   . Frequency of Communication with Friends and Family:   . Frequency of Social Gatherings with Friends and Family:   . Attends Religious Services:   . Active Member of Clubs or Organizations:   . Attends Archivist Meetings:   Marland Kitchen Marital Status:    Allergies  Allergen Reactions  . Rosuvastatin     Other reaction(s): GI Upset (intolerance) Crestor - Intestinal issues   . Statins Other (See Comments)    Crestor - Intestinal issues    Family History  Problem Relation Age of Onset  . Hypertension Mother   . Stroke Mother   . Diabetes Mother   . Hyperlipidemia Mother   . Obesity Mother   . Diabetes Sister   . Stroke Maternal Grandmother        43 died   . Heart disease Maternal Grandfather   . Hypertension Maternal Grandfather   . Diabetes Maternal Grandfather   . Arthritis Father   . Heart disease Father   . Heart disease Paternal Grandfather     Current Outpatient Medications (Endocrine & Metabolic):  .  metFORMIN (GLUCOPHAGE) 500 MG tablet, Take 1 tablet (500 mg total) by mouth daily with breakfast.  Current Outpatient Medications (Cardiovascular):  .  diltiazem (CARDIZEM CD) 180 MG 24 hr capsule, Take 1 capsule (180 mg total) by mouth daily. Marland Kitchen  ezetimibe (ZETIA) 10 MG tablet, TAKE 1 TABLET BY MOUTH EVERY DAY (Patient taking differently: Take 10 mg by mouth daily. ) .  flecainide (TAMBOCOR) 100 MG tablet, Take 1 tablet (100 mg total) by mouth 2 (two) times daily. Marland Kitchen  LIVALO 2 MG TABS, TAKE 1 TABLET BY MOUTH EVERY DAY (Patient taking differently: Take 2 mg by mouth daily. ) .  metoprolol succinate (TOPROL-XL) 100 MG 24 hr tablet, Take 100 mg in the evenings (along with 25 mg or 50 mg in the mornings) (Patient taking differently: Take 100 mg by mouth at bedtime. ) .  olmesartan (BENICAR) 20 MG tablet, TAKE 1 TABLET BY MOUTH EVERY DAY (Patient  taking differently: Take 20 mg by mouth daily. ) .  spironolactone (ALDACTONE) 25 MG tablet, TAKE 1/2 TABLET BY MOUTH EVERY DAY (Patient taking differently: Take 12.5 mg by mouth daily. )   Current Outpatient Medications (Analgesics):  .  aspirin EC 81 MG tablet, Take 1 tablet (81 mg total) by mouth daily.  Current Outpatient Medications (Hematological):  Marland Kitchen  ELIQUIS 5 MG TABS tablet, TAKE 1 TABLET BY MOUTH TWICE A DAY (Patient taking differently: Take 5 mg by mouth 2 (two) times daily. )  Current Outpatient Medications (Other):  Marland Kitchen  Ascorbic Acid (VITAMIN C) 1000 MG tablet, Take 1,000  mg by mouth daily.  .  Boswellia-Glucosamine-Vit D (GLUCOSAMINE COMPLEX PO), Take 3 tablets by mouth daily.  .  Coenzyme Q10 200 MG capsule, Take 200 mg by mouth daily.  .  Multiple Vitamin (MULITIVITAMIN WITH MINERALS) TABS, Take 1 tablet by mouth daily.  [name] 3-6-9 Fatty Acids (OMEGA-3-6-9 PO), Take 3 capsules by mouth every morning. Omega 3= 800mg ,omega 6=380mg ,omega 9 =300mg  .  tamsulosin (FLOMAX) 0.4 MG CAPS capsule, Take 0.4 mg by mouth daily. Marland Kitchen  triamcinolone cream (KENALOG) 0.5 %, APPLY 1 APPLICATION TOPICALLY 2 (TWO) TIMES DAILY. TO AFFECTED AREAS. Marland Kitchen  zinc gluconate 50 MG tablet, Take 50 mg by mouth daily.    Reviewed prior external information including notes and imaging from  primary care provider As well as notes that were available from care everywhere and other healthcare systems.  Past medical history, social, surgical and family history all reviewed in electronic medical record.  No pertanent information unless stated regarding to the chief complaint.   Review of Systems:  No headache, visual changes, nausea, vomiting, diarrhea, constipation, dizziness, abdominal pain, skin rash, fevers, chills, night sweats, weight loss, swollen lymph nodes, body aches, joint swelling, chest pain, shortness of breath, mood changes. POSITIVE muscle aches  Objective  Blood pressure 128/82, pulse 60,  height 6\' 3"  (1.905 m), weight 249 lb (112.9 kg), SpO2 97 %.   General: No apparent distress alert and oriented x3 mood and affect normal, dressed appropriately.  HEENT: Pupils equal, extraocular movements intact  Respiratory: Patient's speak in full sentences and does not appear short of breath  Cardiovascular: No lower extremity edema, non tender, no erythema  Neuro: Cranial nerves II through XII are intact, neurovascularly intact in all extremities with 2+ DTRs and 2+ pulses.  Gait normal with good balance and coordination.  MSK:  Non tender with full range of motion and good stability and symmetric strength and tone of shoulders, elbows, wrist, hip, and ankles bilaterally.  Left knee exam does have some arthritic changes.  No instability noted with valgus and varus force.  Patient was wearing the brace initially.  Significant decrease in swelling from previous exam and significant decrease in tenderness from previous exam.  Low back exam still shows significant tightness noted in the paraspinal musculature lumbar spine right greater than left.  Negative straight leg test.  Tightness with FABER test.  Mild pain in the thoracolumbar juncture as well.  Osteopathic findings  T6 extended rotated and side bent left L2 flexed rotated and side bent right Sacrum right on right  Impression and Recommendations:     This case required medical decision making of moderate complexity. The above documentation has been reviewed and is accurate and complete [name], DO       Note: This dictation was prepared with Dragon dictation along with smaller phrase technology. Any transcriptional errors that result from this process are unintentional.

## 2019-05-09 NOTE — Assessment & Plan Note (Signed)
Patient is significantly happier with the knee at the moment with the brace and the steroid.  Hold on the viscosupplementation but do have approval.  We will continue to monitor.  Follow-up again in 4 to 8 weeks.

## 2019-05-18 ENCOUNTER — Ambulatory Visit: Payer: Medicare PPO | Admitting: Student

## 2019-05-18 ENCOUNTER — Other Ambulatory Visit (INDEPENDENT_AMBULATORY_CARE_PROVIDER_SITE_OTHER): Payer: Self-pay | Admitting: Family Medicine

## 2019-05-18 DIAGNOSIS — R7303 Prediabetes: Secondary | ICD-10-CM

## 2019-05-29 ENCOUNTER — Other Ambulatory Visit: Payer: Self-pay

## 2019-05-29 ENCOUNTER — Ambulatory Visit: Payer: Medicare PPO | Admitting: Family Medicine

## 2019-05-29 ENCOUNTER — Encounter: Payer: Self-pay | Admitting: Family Medicine

## 2019-05-29 VITALS — BP 122/66 | HR 69 | Ht 75.0 in | Wt 253.0 lb

## 2019-05-29 DIAGNOSIS — G8929 Other chronic pain: Secondary | ICD-10-CM | POA: Diagnosis not present

## 2019-05-29 DIAGNOSIS — M545 Low back pain, unspecified: Secondary | ICD-10-CM

## 2019-05-29 DIAGNOSIS — M999 Biomechanical lesion, unspecified: Secondary | ICD-10-CM

## 2019-05-29 NOTE — Assessment & Plan Note (Signed)

## 2019-05-29 NOTE — Progress Notes (Signed)
[place] Phone: [phone] Subjective:   Clayton [name], am serving as a scribe for Dr. Hulan Saas.\ This visit occurred during the SARS-CoV-2 public health emergency.  Safety protocols were in place, including screening questions prior to the visit, additional usage of staff PPE, and extensive cleaning of exam room while observing appropriate contact time as indicated for disinfecting solutions.    I'm seeing this patient by the request  of:  Kuneff, Renee A, DO  CC: Knee pain, back pain follow-up  [id number]   05/09/2019 Patient is significantly happier with the knee at the moment with the brace and the steroid.  Hold on the viscosupplementation but do have approval.  We will continue to monitor.  Follow-up again in 4 to 8 weeks.  Update 05/29/2019 Clayton [name] is a 74 y.o. male coming in with complaint of left knee pain and back pain. Pain has decreased in both areas. Injection helped knee pain. Wears brace for stability.      Past Medical History:  Diagnosis Date  . Arthritis    hips   . Atrial fibrillation ([place])   . Cataract   . CPAP (continuous positive airway pressure) dependence   . Dysrhythmia    PAF- hx of 7 years ago   . HLD (hyperlipidemia)   . Hypertension 05/29/10   ECHO-EF 67% wnl  . Meniere disease   . Obesity   . Palpitations   . Sleep apnea 09/14/04   [facility]- Dr. Humphrey Rolls; CPAP titration 11/12/04- Dr. Humphrey Rolls  . Stroke [facility])    Past Surgical History:  Procedure Laterality Date  . CARDIAC CATHETERIZATION  11/03/05  . CARDIOVERSION N/A 09/26/2018   Procedure: CARDIOVERSION;  Surgeon: Geralynn Rile, MD;  Location: [place];  Service: Endoscopy;  Laterality: N/A;  . CARDIOVERSION N/A 03/13/2019   Procedure: CARDIOVERSION;  Surgeon: Skeet Latch, MD;  Location: [place];  Service: Cardiovascular;  Laterality: N/A;  . CATARACT EXTRACTION  2014    . KNEE ARTHROSCOPY     left knee- 2002   . TOTAL HIP ARTHROPLASTY  06/08/2011   Procedure: TOTAL HIP ARTHROPLASTY ANTERIOR APPROACH;  Surgeon: [name], MD;  [facility];  Service: Orthopedics;  Laterality: Left;   Social History   Socioeconomic History  . Marital status: Married    Spouse name: Goeffrey Berchtold  . Number of children: Not on file  . Years of education: Not on file  . Highest education level: Not on file  Occupational History  . Occupation: Retired  Tobacco Use  . Smoking status: Never Smoker  . Smokeless tobacco: Never Used  Substance and Sexual Activity  . Alcohol use: [name]  . Drug use: [name]  . Sexual activity: Yes    Birth control/protection: None  Other Topics Concern  . Not on file  Social History Narrative   Married. [name].Takes care of his mother-in-law as well.    Retired, Conservator, museum/gallery.    Drinks caffeine beverages. Takes a daily vitamin.   Exercises routinely.    Smoke detector in the home.    Ambulates independently.    Social Determinants of Health   Financial Resource Strain:   . Difficulty of Paying Living Expenses:   Food Insecurity:   . Worried About Charity fundraiser in the Last Year:   . Arboriculturist in the Last Year:   Transportation Needs:   . Lack of Transportation (  Medical):   Marland Kitchen Lack of Transportation (Non-Medical):   Physical Activity:   . Days of Exercise per Week:   . Minutes of Exercise per Session:   Stress:   . Feeling of Stress :   Social Connections:   . Frequency of Communication with Friends and Family:   . Frequency of Social Gatherings with Friends and Family:   . Attends Religious Services:   . Active Member of Clubs or Organizations:   . Attends Archivist Meetings:   Marland Kitchen Marital Status:    Allergies  Allergen Reactions  . Rosuvastatin     Other reaction(s): GI Upset (intolerance) Crestor - Intestinal issues   . Statins Other (See Comments)    Crestor - Intestinal issues     Family History  Problem Relation Age of Onset  . Hypertension Mother   . Stroke Mother   . Diabetes Mother   . Hyperlipidemia Mother   . Obesity Mother   . Diabetes Sister   . Stroke Maternal Grandmother        77 died   . Heart disease Maternal Grandfather   . Hypertension Maternal Grandfather   . Diabetes Maternal Grandfather   . Arthritis Father   . Heart disease Father   . Heart disease Paternal Grandfather     Current Outpatient Medications (Endocrine & Metabolic):  .  metFORMIN (GLUCOPHAGE) 500 MG tablet, Take 1 tablet (500 mg total) by mouth daily with breakfast.  Current Outpatient Medications (Cardiovascular):  .  ezetimibe (ZETIA) 10 MG tablet, TAKE 1 TABLET BY MOUTH EVERY DAY (Patient taking differently: Take 10 mg by mouth daily. ) .  flecainide (TAMBOCOR) 100 MG tablet, Take 1 tablet (100 mg total) by mouth 2 (two) times daily. Marland Kitchen  LIVALO 2 MG TABS, TAKE 1 TABLET BY MOUTH EVERY DAY (Patient taking differently: Take 2 mg by mouth daily. ) .  metoprolol succinate (TOPROL-XL) 100 MG 24 hr tablet, Take 100 mg in the evenings (along with 25 mg or 50 mg in the mornings) (Patient taking differently: Take 100 mg by mouth at bedtime. ) .  olmesartan (BENICAR) 20 MG tablet, TAKE 1 TABLET BY MOUTH EVERY DAY (Patient taking differently: Take 20 mg by mouth daily. ) .  spironolactone (ALDACTONE) 25 MG tablet, TAKE 1/2 TABLET BY MOUTH EVERY DAY (Patient taking differently: Take 12.5 mg by mouth daily. ) .  diltiazem (CARDIZEM CD) 180 MG 24 hr capsule, Take 1 capsule (180 mg total) by mouth daily.   Current Outpatient Medications (Analgesics):  .  aspirin EC 81 MG tablet, Take 1 tablet (81 mg total) by mouth daily.  Current Outpatient Medications (Hematological):  Marland Kitchen  ELIQUIS 5 MG TABS tablet, TAKE 1 TABLET BY MOUTH TWICE A DAY (Patient taking differently: Take 5 mg by mouth 2 (two) times daily. )  Current Outpatient Medications (Other):  Marland Kitchen  Ascorbic Acid (VITAMIN C) 1000 MG  tablet, Take 1,000 mg by mouth daily.  .  Boswellia-Glucosamine-Vit D (GLUCOSAMINE COMPLEX PO), Take 3 tablets by mouth daily.  .  Coenzyme Q10 200 MG capsule, Take 200 mg by mouth daily.  .  Multiple Vitamin (MULITIVITAMIN WITH MINERALS) TABS, Take 1 tablet by mouth daily.  [name] 3-6-9 Fatty Acids (OMEGA-3-6-9 PO), Take 3 capsules by mouth every morning. Omega 3= 800mg ,omega 6=380mg ,omega 9 =300mg  .  tamsulosin (FLOMAX) 0.4 MG CAPS capsule, Take 0.4 mg by mouth daily. Marland Kitchen  triamcinolone cream (KENALOG) 0.5 %, APPLY 1 APPLICATION TOPICALLY 2 (TWO) TIMES DAILY. TO  AFFECTED AREAS. Marland Kitchen  zinc gluconate 50 MG tablet, Take 50 mg by mouth daily.    Reviewed prior external information including notes and imaging from  primary care provider As well as notes that were available from care everywhere and other healthcare systems.  Past medical history, social, surgical and family history all reviewed in electronic medical record.  [name] pertanent information unless stated regarding to the chief complaint.   Review of Systems:  [name] headache, visual changes, nausea, vomiting, diarrhea, constipation, dizziness, abdominal pain, skin rash, fevers, chills, night sweats, weight loss, swollen lymph nodes, body aches, joint swelling, chest pain, shortness of breath, mood changes. POSITIVE muscle aches  Objective  Blood pressure 122/66, pulse 69, height 6\' 3"  (1.905 m), weight 253 lb (114.8 kg), SpO2 97 %.   General: [name] apparent distress alert and oriented x3 mood and affect normal, dressed appropriately.  HEENT: Pupils equal, extraocular movements intact  Respiratory: Patient's speak in full sentences and does not appear short of breath  Cardiovascular: [name] lower extremity edema, non tender, [name] erythema  Neuro: Cranial nerves II through XII are intact, neurovascularly intact in all extremities with 2+ DTRs and 2+ pulses.  Gait very mild antalgic MSK:   Left knee patient is wearing a custom brace and seems to be  very comfortable.  Full flexion and extension noted  Patient's back exam does have some mild loss of lordosis.  Some tightness noted in the paraspinal musculature right greater than left.  Mild positive Corky Sox.  Negative straight leg test.  Osteopathic findings  T8 extended rotated and side bent left L3 flexed rotated and side bent right Sacrum right on right     Impression and Recommendations:     This case required medical decision making of moderate complexity. The above documentation has been reviewed and is accurate and complete [name], DO       Note: This dictation was prepared with Dragon dictation along with smaller phrase technology. Any transcriptional errors that result from this process are unintentional.

## 2019-05-29 NOTE — Patient Instructions (Addendum)
See me in 6-8 weeks Have a great trip 

## 2019-05-29 NOTE — Assessment & Plan Note (Signed)
Chronic problem with mild exacerbation.  Discussed with patient about icing regimen, home exercise, which activities to do which wants to avoid.  Patient will increase activity slowly over the course the next several weeks.  Discussed icing regimen.  Follow-up again in 4 to 8 weeks

## 2019-06-01 ENCOUNTER — Ambulatory Visit: Payer: Medicare PPO | Admitting: Family Medicine

## 2019-06-11 DIAGNOSIS — R3915 Urgency of urination: Secondary | ICD-10-CM | POA: Diagnosis not present

## 2019-06-11 DIAGNOSIS — R310 Gross hematuria: Secondary | ICD-10-CM | POA: Diagnosis not present

## 2019-06-11 DIAGNOSIS — N401 Enlarged prostate with lower urinary tract symptoms: Secondary | ICD-10-CM | POA: Diagnosis not present

## 2019-06-12 ENCOUNTER — Ambulatory Visit (INDEPENDENT_AMBULATORY_CARE_PROVIDER_SITE_OTHER): Payer: Medicare PPO | Admitting: Family Medicine

## 2019-06-12 ENCOUNTER — Encounter (INDEPENDENT_AMBULATORY_CARE_PROVIDER_SITE_OTHER): Payer: Self-pay | Admitting: Family Medicine

## 2019-06-12 ENCOUNTER — Other Ambulatory Visit: Payer: Self-pay

## 2019-06-12 VITALS — BP 171/82 | HR 56 | Temp 97.8°F | Ht 75.0 in | Wt 240.0 lb

## 2019-06-12 DIAGNOSIS — R7303 Prediabetes: Secondary | ICD-10-CM

## 2019-06-12 DIAGNOSIS — Z6830 Body mass index (BMI) 30.0-30.9, adult: Secondary | ICD-10-CM

## 2019-06-12 DIAGNOSIS — I10 Essential (primary) hypertension: Secondary | ICD-10-CM

## 2019-06-12 DIAGNOSIS — I4819 Other persistent atrial fibrillation: Secondary | ICD-10-CM

## 2019-06-12 DIAGNOSIS — E669 Obesity, unspecified: Secondary | ICD-10-CM

## 2019-06-12 MED ORDER — METFORMIN HCL 500 MG PO TABS: 500.0000 mg | ORAL_TABLET | Freq: Every day | ORAL | 0 refills | Status: DC

## 2019-06-12 NOTE — Progress Notes (Signed)
Chief Complaint:   OBESITY Clayton [name] is here to discuss his progress with his obesity treatment plan along with follow-up of his obesity related diagnoses. Clayton [name] is on keeping a food journal and adhering to recommended goals of 1800 calories and 100 grams of protein daily and states he is following his eating plan approximately 32% of the time. Kolbey states he is walking and bike riding for 30 minutes 1-2 times per week.  Today's visit was #: 20 Starting weight: 266 lbs Starting date: 11/01/2017 Today's weight: 240 lbs Today's date: 06/12/2019 Total lbs lost to date: 26 Total lbs lost since last in-office visit: 5  Interim History: Clayton [name] has been out of town for 28 days. He will have more significant travel the next 90 days, and estimates to be away from home at least 42 days. He is really committed to staying on the plan and is exercising regularly while traveling. He is mindful of his food choices and his activity level. He plan on doing more hiking in the near future.  Subjective:   1. Essential hypertension Vue's blood pressure is elevated at his office visit today. His ambulatory blood pressure readings systolic ranges between [id number] and 0000000, and diastolic ranges between 80 and 90. He denies chest pain or dyspnea with exertion. He is on multiple anti-hypertensive medications.  2. Pre-diabetes Clayton [name]'s A1c on 09/04/2018 was 5.8. he has been on metformin, and it tolerating it well.  3. Persistent atrial fibrillation (HCC) Clayton [name] has had August cardioversion and March cardioversion, and he feels that he has been in normal sinus rhythm since his last cardioversion.  Assessment/Plan:   1. Essential hypertension Clayton [name] is working on healthy weight loss and exercise to improve blood pressure control. We will watch for signs of hypotension as he continues his lifestyle modifications. Cean will follow up with his Cardiologist as soon as possible. We will recheck labs at his next  office visit.  2. Pre-diabetes Clayton [name] will continue to work on weight loss, exercise, and decreasing simple carbohydrates to help decrease the risk of diabetes. We will refill metformin for 1 month, and we will recheck labs at his next office visit.  - metFORMIN (GLUCOPHAGE) 500 MG tablet; Take 1 tablet (500 mg total) by mouth daily with breakfast.  Dispense: 30 tablet; Refill: 0  3. Persistent atrial fibrillation (HCC) Clayton [name] will follow up with his Cardiologist, and will follow up with our clinic as directed.  4. Class 1 obesity with serious comorbidity and body mass index (BMI) of 30.0 to 30.9 in adult, unspecified obesity type Clayton [name] is currently in the action stage of change. As such, his goal is to continue with weight loss efforts. He has agreed to keeping a food journal and adhering to recommended goals of 1800 calories and 100+ grams of protein daily.   Exercise goals: As is.  Behavioral modification strategies: increasing lean protein intake, decreasing simple carbohydrates, no skipping meals, travel eating strategies and keeping a strict food journal.  Clayton [name] has agreed to follow-up with our clinic in 4 weeks. He was informed of the importance of frequent follow-up visits to maximize his success with intensive lifestyle modifications for his multiple health conditions.   Objective:   Blood pressure (!) 171/82, pulse (!) 56, temperature 97.8 F (36.6 C), temperature source Oral, height 6\' 3"  (1.905 m), weight 240 lb (108.9 kg), SpO2 98 %. Body mass index is 30 kg/m.  General: Cooperative, alert, well developed, in no acute distress. HEENT: Conjunctivae and lids unremarkable.  Cardiovascular: Regular rhythm.  Lungs: Normal work of breathing. Neurologic: No focal deficits.   Lab Results  Component Value Date   CREATININE 1.50 (H) 03/13/2019   BUN 30 (H) 03/13/2019   NA 137 03/13/2019   K 4.5 03/13/2019   CL 105 03/13/2019   CO2 23 03/05/2019   Lab Results  Component  Value Date   ALT 31 01/09/2019   AST 30 01/09/2019   ALKPHOS 64 01/09/2019   BILITOT 0.8 01/09/2019   Lab Results  Component Value Date   HGBA1C 5.8 (H) 09/04/2018   HGBA1C 6.0 (H) 11/01/2017   HGBA1C 5.4 04/14/2016   HGBA1C 6.0 06/09/2015   Lab Results  Component Value Date   INSULIN 9.0 09/04/2018   INSULIN 22.4 11/01/2017   Lab Results  Component Value Date   TSH 1.290 08/09/2018   Lab Results  Component Value Date   CHOL 114 01/05/2019   HDL 57 01/05/2019   LDLCALC 47 01/05/2019   TRIG 39 01/05/2019   CHOLHDL 2.0 01/05/2019   Lab Results  Component Value Date   WBC 4.8 03/05/2019   HGB 15.6 03/13/2019   HCT 46.0 03/13/2019   MCV 90 03/05/2019   PLT 168 03/05/2019   No results found for: IRON, TIBC, FERRITIN  Obesity Behavioral Intervention Documentation for Insurance:   Approximately 15 minutes were spent on the discussion below.  ASK: We discussed the diagnosis of obesity with Awanda Mink today and Calyn agreed to give [name] permission to discuss obesity behavioral modification therapy today.  ASSESS: Seann has the diagnosis of obesity and his BMI today is 30. Bert is in the action stage of change.   ADVISE: Chrishaun was educated on the multiple health risks of obesity as well as the benefit of weight loss to improve his health. He was advised of the need for long term treatment and the importance of lifestyle modifications to improve his current health and to decrease his risk of future health problems.  AGREE: Multiple dietary modification options and treatment options were discussed and Eilam agreed to follow the recommendations documented in the above note.  ARRANGE: Paxtin was educated on the importance of frequent visits to treat obesity as outlined per CMS and USPSTF guidelines and agreed to schedule his next follow up appointment today.  Attestation Statements:   Reviewed by clinician on day of visit: allergies, medications, problem list, medical  history, surgical history, family history, social history, and previous encounter notes.   I, Trixie Dredge, am acting as transcriptionist for Dennard Nip, MD.  I have reviewed the above documentation for accuracy and completeness, and I agree with the above. -  Dennard Nip, MD

## 2019-06-18 ENCOUNTER — Other Ambulatory Visit (INDEPENDENT_AMBULATORY_CARE_PROVIDER_SITE_OTHER): Payer: Self-pay | Admitting: Family Medicine

## 2019-06-18 DIAGNOSIS — R7303 Prediabetes: Secondary | ICD-10-CM

## 2019-06-19 NOTE — Progress Notes (Signed)
Cardiology Office Note   Date:  06/20/2019   ID:  Clayton [name], DOB [birth year], MRN [id number]  PCP:  Ma Hillock, DO  Cardiologist:  Shelva Majestic, MD EP: Will Meredith Leeds, MD  Chief Complaint  Patient presents with  . Hypertension      History of Present Illness: Clayton [name] is a 74 y.o. male with a PMH of paroxysmal atrial fibrillation, HTN, HLD, OSA on CPAP, and Meniere's disease, who presents for elevated blood pressures and SOB.   He was last seen by cardiology at an outpatient visit with Dr. Claiborne Billings 03/2019, at which time he felt his hear rhythm was stable following DCCV 03/2019 for management of his persistent atrial fibrillation. He was maintaining sinus rhythm on EKG and recommended to continue current medications and follow-up in 4 months. His last echocardiogram was 01/2019 which showed EF 50-55%, moderate LVH, indeterminate LV diastolic function, no significant valvular abnormalities, and mild dilation of the ascending aortic aneurysm. His last ischemic evaluation appears to have been a remote cardiac cath in 2007 which was presumably without disease.    He contacted our office via [name] 06/18/19 to report elevated blood pressure readings, as well as c/f medication side effects with intermittent fatigue, dizziness, nausea, lack of energy, and SOB. He was scheduled for this visit to further evaluate these complaints.   He presents today with complaints of elevated blood pressures and DOE. In hindsight he has felt poorly since his flecainide was increased prior to his last cardioversion 03/2019. He feels fatigued and notes SOB with activity. He is quite active at baseline, walking 3 miles per day, riding a stationary bike for 30 minutes several times a week, and occasionally playing golf. Over the past month he has not felt like doing his usual activities. He reports when he is active his HR rarely gets out of the 50s. [name] work is taking longer due to easy  fatigability. Also with occasional lightheadedness/unsteadiness on his feet particularly when bending over or when he starts walking which he states feels different than his Meniere's. No complaints of pre-syncope or syncope. Also struggling with his CPAP machine, typically feeling SOB for the first 10 minutes before he is able to settle down and fall asleep. No complaints of chest pain, LE edema, orthopnea, PND, or weight gain. No complaints of bleeding with eliquis. He makes the comment that he would rather be back in atrial fibrillation than feel like this.     Past Medical History:  Diagnosis Date  . Arthritis    hips   . Atrial fibrillation ([facility])   . Cataract   . CPAP (continuous positive airway pressure) dependence   . Dysrhythmia    PAF- hx of 7 years ago   . HLD (hyperlipidemia)   . Hypertension 05/29/10   ECHO-EF 67% wnl  . Meniere disease   . Obesity   . Palpitations   . Sleep apnea 09/14/04   [facility]- Dr. Humphrey Rolls; CPAP titration 11/12/04- Dr. Humphrey Rolls  . Stroke [facility])     Past Surgical History:  Procedure Laterality Date  . CARDIAC CATHETERIZATION  11/03/05  . CARDIOVERSION N/A 09/26/2018   Procedure: CARDIOVERSION;  Surgeon: Geralynn Rile, MD;  Location: [facility];  Service: Endoscopy;  Laterality: N/A;  . CARDIOVERSION N/A 03/13/2019   Procedure: CARDIOVERSION;  Surgeon: Skeet Latch, MD;  Location: [facility];  Service: Cardiovascular;  Laterality: N/A;  . CATARACT EXTRACTION  2014  . KNEE ARTHROSCOPY  left knee- 2002   . TOTAL HIP ARTHROPLASTY  06/08/2011   Procedure: TOTAL HIP ARTHROPLASTY ANTERIOR APPROACH;  Surgeon: [name], MD;  Location: [facility];  Service: Orthopedics;  Laterality: Left;     Current Outpatient Medications  Medication Sig Dispense Refill  . Ascorbic Acid (VITAMIN C) 1000 MG tablet Take 1,000 mg by mouth daily.     Marland Kitchen aspirin EC 81 MG tablet Take 1 tablet (81 mg total) by mouth daily. 90 tablet 3  .  Boswellia-Glucosamine-Vit D (GLUCOSAMINE COMPLEX PO) Take 3 tablets by mouth daily.     . Coenzyme Q10 200 MG capsule Take 200 mg by mouth daily.     Marland Kitchen ELIQUIS 5 MG TABS tablet TAKE 1 TABLET BY MOUTH TWICE A DAY 180 tablet 2  . ezetimibe (ZETIA) 10 MG tablet TAKE 1 TABLET BY MOUTH EVERY DAY 90 tablet 3  . flecainide (TAMBOCOR) 100 MG tablet Take 0.5 tablets (50 mg total) by mouth 2 (two) times daily. 30 tablet 11  . LIVALO 2 MG TABS TAKE 1 TABLET BY MOUTH EVERY DAY 90 tablet 3  . metFORMIN (GLUCOPHAGE) 500 MG tablet Take 1 tablet (500 mg total) by mouth daily with breakfast. 30 tablet 0  . metoprolol succinate (TOPROL-XL) 100 MG 24 hr tablet Take 100 mg in the evenings (along with 25 mg or 50 mg in the mornings) (Patient taking differently: Take 100 mg by mouth. Take 100 mg in the evenings) 90 tablet 3  . Multiple Vitamin (MULITIVITAMIN WITH MINERALS) TABS Take 1 tablet by mouth daily.     Marland Kitchen olmesartan (BENICAR) 20 MG tablet TAKE 1 TABLET BY MOUTH EVERY DAY 90 tablet 2  . Omega 3-6-9 Fatty Acids (OMEGA-3-6-9 PO) Take 3 capsules by mouth every morning. Omega 3= 800mg ,omega 6=380mg ,omega 9 =300mg     . spironolactone (ALDACTONE) 25 MG tablet TAKE 1/2 TABLET BY MOUTH EVERY DAY 45 tablet 3  . tamsulosin (FLOMAX) 0.4 MG CAPS capsule Take 0.4 mg by mouth daily.    Marland Kitchen zinc gluconate 50 MG tablet Take 50 mg by mouth daily.     Marland Kitchen amLODipine (NORVASC) 5 MG tablet Take 1 tablet (5 mg total) by mouth daily. 180 tablet 3   No current facility-administered medications for this visit.    Allergies:   Rosuvastatin and Statins    Social History:  The patient  reports that he has never smoked. He has never used smokeless tobacco. He reports that he does not drink alcohol or use drugs.   Family History:  The patient's family history includes Arthritis in his father; Diabetes in his maternal grandfather, mother, and sister; Heart disease in his father, maternal grandfather, and paternal grandfather; Hyperlipidemia  in his mother; Hypertension in his maternal grandfather and mother; Obesity in his mother; Stroke in his maternal grandmother and mother.    ROS:  Please see the history of present illness.   Otherwise, review of systems are positive for none.   All other systems are reviewed and negative.    PHYSICAL EXAM: VS:  BP (!) 160/94   Pulse (!) 55   Temp (!) 97 F (36.1 C) Comment: Forehead  Ht 6\' 3"  (1.905 m)   Wt 247 lb (112 kg)   SpO2 97%   BMI 30.87 kg/m  , BMI Body mass index is 30.87 kg/m. GEN: Well nourished, well developed, in no acute distress HEENT: sclera anicteric Neck: no JVD, carotid bruits, or masses Cardiac: bradycardic, regular rhythm; no murmurs, rubs, or gallops, no  edema  Respiratory:  clear to auscultation bilaterally, normal work of breathing GI: soft, nontender, nondistended, + BS MS: no deformity or atrophy Skin: warm and dry, no rash Neuro:  Strength and sensation are intact Psych: euthymic mood, full affect   EKG:  EKG is ordered today. The ekg ordered today demonstrates sinus rhythm with 1st degree AV block, rate 55 bpm, chronic RBBB, no STE/D   Recent Labs: 08/09/2018: TSH 1.290 01/09/2019: ALT 31; Magnesium 2.3 03/05/2019: Platelets 168 03/13/2019: BUN 30; Creatinine, Ser 1.50; Hemoglobin 15.6; Potassium 4.5; Sodium 137    Lipid Panel    Component Value Date/Time   CHOL 114 01/05/2019 1003   TRIG 39 01/05/2019 1003   HDL 57 01/05/2019 1003   CHOLHDL 2.0 01/05/2019 1003   CHOLHDL 3.8 10/12/2016 0902   VLDL 17 04/14/2016 0937   LDLCALC 47 01/05/2019 1003   LDLCALC 119 (H) 10/12/2016 0902      Wt Readings from Last 3 Encounters:  06/20/19 247 lb (112 kg)  06/20/19 247 lb (112 kg)  06/12/19 240 lb (108.9 kg)      Other studies Reviewed: Additional studies/ records that were reviewed today include:   Echocardiogram 01/2019: 1. Left ventricular ejection fraction, by visual estimation, is 50 to  55%. The left ventricle has low normal  function. There is moderately  increased left ventricular hypertrophy.  2. Left ventricular diastolic function could not be evaluated.  3. Mildly dilated left ventricular internal cavity size.  4. The left ventricle has no regional wall motion abnormalities.  5. Global right ventricle has normal systolic function.The right  ventricular size is normal. No increase in right ventricular wall  thickness.  6. Left atrial size was mildly dilated.  7. Right atrial size was normal.  8. The mitral valve is normal in structure. Trivial mitral valve  regurgitation.  9. The tricuspid valve is normal in structure.  10. The aortic valve is tricuspid. Aortic valve regurgitation is not  visualized. No evidence of aortic valve sclerosis or stenosis.  11. The pulmonic valve was grossly normal. Pulmonic valve regurgitation is  not visualized.  12. Aortic dilatation noted.  13. There is mild dilatation of the ascending aorta measuring 39 mm.  14. The inferior vena cava is normal in size with greater than 50%  respiratory variability, suggesting right atrial pressure of 3 mmHg.      ASSESSMENT AND PLAN:  1. HTN: BP 160/94 today - has been elevated on several outpatient visits recently. He admits to avoiding BP checks at home recently for fear that he would be back in atrial fibrillation. Possible bradycardia is contributing to symptoms. He reports HR rarely gets out of the 50s even with activity - Will stop diltiazem and start amlodipine 5mg  daily for blood pressure - Continue olmesartan, metoprolol, and spironolactone   2. Paroxysmal atrial fibrillation: EKG with sinus bradycardia with 1st degree AV block today.  - Continue eliquis for stroke ppx - Will decrease flecainide to 50mg  BID as symptoms onset seemed to coincide with increase in flecainide.  - Continue metoprolol for rate control - Encouraged follow-up with EP for ongoing discussions on management of his atrial fibrillation  3.  Fatigue/DOE: unclear etiology. Echo 01/2019 showed normal EF. He appears euvolemic on exam. No recent ischemic evaluation - question whether this is an anginal equivalent. No recent ischemic evaluation.  - Will check a NST  4. HLD: LDL 54 12/2018  - He asks to check FLP prior to his visit with Dr. Claiborne Billings  later this month - Continue zetia  5. OSA on CPAP: compliant. Recent struggle with his mask in the first 10 minutes after going to bed. Unclear etiology. - Continue CPAP  6. Meniere's disease: possible this is contributing to some of his dizziness.   7. CKD stage 3: Cr 1.5 02/2019 - Will check BMET prior to his visit with Dr. Claiborne Billings later this month   Current medicines are reviewed at length with the patient today.  The patient does not have concerns regarding medicines.  The following changes have been made:  As above  Labs/ tests ordered today include:   Orders Placed This Encounter  Procedures  . Comprehensive metabolic panel  . Lipid panel  . MYOCARDIAL PERFUSION IMAGING  . EKG 12-Lead     Disposition:   FU with Dr. Claiborne Billings as scheduled later this month  Signed, Abigail Butts, PA-C  06/20/2019 1:50 PM

## 2019-06-20 ENCOUNTER — Ambulatory Visit: Payer: Medicare PPO | Admitting: Medical

## 2019-06-20 ENCOUNTER — Encounter: Payer: Self-pay | Admitting: Cardiovascular Disease

## 2019-06-20 ENCOUNTER — Encounter: Payer: Self-pay | Admitting: Medical

## 2019-06-20 ENCOUNTER — Other Ambulatory Visit: Payer: Self-pay

## 2019-06-20 ENCOUNTER — Ambulatory Visit: Payer: Medicare PPO | Admitting: Family Medicine

## 2019-06-20 ENCOUNTER — Encounter: Payer: Self-pay | Admitting: Family Medicine

## 2019-06-20 VITALS — BP 160/94 | HR 55 | Temp 97.0°F | Ht 75.0 in | Wt 247.0 lb

## 2019-06-20 VITALS — BP 108/64 | HR 55 | Ht 75.0 in | Wt 247.0 lb

## 2019-06-20 DIAGNOSIS — M545 Low back pain, unspecified: Secondary | ICD-10-CM

## 2019-06-20 DIAGNOSIS — R0609 Other forms of dyspnea: Secondary | ICD-10-CM

## 2019-06-20 DIAGNOSIS — G4733 Obstructive sleep apnea (adult) (pediatric): Secondary | ICD-10-CM | POA: Diagnosis not present

## 2019-06-20 DIAGNOSIS — R5383 Other fatigue: Secondary | ICD-10-CM

## 2019-06-20 DIAGNOSIS — I480 Paroxysmal atrial fibrillation: Secondary | ICD-10-CM

## 2019-06-20 DIAGNOSIS — R0600 Dyspnea, unspecified: Secondary | ICD-10-CM

## 2019-06-20 DIAGNOSIS — G8929 Other chronic pain: Secondary | ICD-10-CM | POA: Diagnosis not present

## 2019-06-20 DIAGNOSIS — E782 Mixed hyperlipidemia: Secondary | ICD-10-CM

## 2019-06-20 DIAGNOSIS — Z9989 Dependence on other enabling machines and devices: Secondary | ICD-10-CM

## 2019-06-20 DIAGNOSIS — I10 Essential (primary) hypertension: Secondary | ICD-10-CM | POA: Diagnosis not present

## 2019-06-20 DIAGNOSIS — M999 Biomechanical lesion, unspecified: Secondary | ICD-10-CM | POA: Diagnosis not present

## 2019-06-20 MED ORDER — FLECAINIDE ACETATE 100 MG PO TABS
50.0000 mg | ORAL_TABLET | Freq: Two times a day (BID) | ORAL | 11 refills | Status: DC
Start: 2019-06-20 — End: 2019-09-04

## 2019-06-20 MED ORDER — AMLODIPINE BESYLATE 5 MG PO TABS: 5.0000 mg | ORAL_TABLET | Freq: Every day | ORAL | 3 refills | Status: DC

## 2019-06-20 NOTE — Progress Notes (Signed)
[facility] Phone: [phone] Subjective:   [name], am serving as a scribe for Dr. Hulan Saas. This visit occurred during the SARS-CoV-2 public health emergency.  Safety protocols were in place, including screening questions prior to the visit, additional usage of staff PPE, and extensive cleaning of exam room while observing appropriate contact time as indicated for disinfecting solutions.   I'm seeing this patient by the request  of:  Kuneff, Renee A, DO  CC: Low back pain follow-up  [id number]  BLAYKE HLAVKA is a 74 y.o. male coming in with complaint of back and neck pain. Last seen 05/29/2019. Patient states that his back has not been bothering.   Loves the custom knee brace for left knee. Helps significantly.   Medications patient has been prescribed: Nothing chronically for pain          Reviewed prior external information including notes and imaging from previsou exam, outside providers and external EMR if available.   As well as notes that were available from care everywhere and other healthcare systems.  Past medical history, social, surgical and family history all reviewed in electronic medical record.  No pertanent information unless stated regarding to the chief complaint.   Past Medical History:  Diagnosis Date  . Arthritis    hips   . Atrial fibrillation ([place])   . Cataract   . CPAP (continuous positive airway pressure) dependence   . Dysrhythmia    PAF- hx of 7 years ago   . HLD (hyperlipidemia)   . Hypertension 05/29/10   ECHO-EF 67% wnl  . Meniere disease   . Obesity   . Palpitations   . Sleep apnea 09/14/04   [facility]- Dr. Humphrey Rolls; CPAP titration 11/12/04- Dr. Humphrey Rolls  . Stroke [facility])     Allergies  Allergen Reactions  . Rosuvastatin     Other reaction(s): GI Upset (intolerance) Crestor - Intestinal issues   . Statins Other (See Comments)     Crestor - Intestinal issues      Review of Systems:  No headache, visual changes, nausea, vomiting, diarrhea, constipation, dizziness, abdominal pain, skin rash, fevers, chills, night sweats, weight loss, swollen lymph nodes, body aches, joint swelling, chest pain, shortness of breath, mood changes. POSITIVE muscle aches  Objective  Blood pressure 108/64, pulse (!) 55, height 6\' 3"  (1.905 m), weight 247 lb (112 kg), SpO2 98 %.   General: No apparent distress alert and oriented x3 mood and affect normal, dressed appropriately.  HEENT: Pupils equal, extraocular movements intact  Respiratory: Patient's speak in full sentences and does not appear short of breath  Cardiovascular: No lower extremity edema, non tender, no erythema  Neuro: Cranial nerves II through XII are intact, neurovascularly intact in all extremities with 2+ DTRs and 2+ pulses.  Gait normal with good balance and coordination.  MSK: Patient does have significant knee arthritis of the left knee with mild instability. Back - N back exam mild loss of lordosis.  Patient does have tenderness to palpation of the paraspinal musculature at the thoracolumbar and sacroiliac right greater than left.  Osteopathic findings  C2 flexed rotated and side bent right C6 flexed rotated and side bent left T7 extended rotated and side bent left inhaled rib T9 extended rotated and side bent right L2 flexed rotated and side bent right Sacrum right on right       Assessment and Plan:  Left knee stable at  this time.  Patient will be going on a long trip and would likely need a steroid injection before he does this.  Low back pain.  Multifactorial.  Chronic but seems to be stable.  Responding well to manipulation.  Patient is no longer taking the gabapentin regularly.  We discussed different medications over-the-counter.  Follow-up with me again 6 to 8 weeks  Nonallopathic problems  Decision today to treat with OMT was based on Physical  Exam  After verbal consent patient was treated with HVLA, ME, FPR techniques in cervical, rib, thoracic, lumbar, and sacral  areas  Patient tolerated the procedure well with improvement in symptoms  Patient given exercises, stretches and lifestyle modifications  See medications in patient instructions if given  Patient will follow up in 4-8 weeks      The above documentation has been reviewed and is accurate and complete [name], DO       Note: This dictation was prepared with Dragon dictation along with smaller phrase technology. Any transcriptional errors that result from this process are unintentional.

## 2019-06-20 NOTE — Patient Instructions (Addendum)
See me again in 6-7 weeks ?

## 2019-06-20 NOTE — Patient Instructions (Signed)
Medication Instructions:   DECREASE Flecainide to 50 mg 2 times a day  START Amlodipine 5 mg daily  STOP Diltiazem  *If you need a refill on your cardiac medications before your next appointment, please call your pharmacy*  Lab Work: Your physician recommends that you return for lab work in 2 WEEKS:   FASTING LIPID PANEL-DO NOT EAT OR DRINK PAST MIDNIGHT. OKAY TO DRINK WATER  CMET  If you have labs (blood work) drawn today and your tests are completely normal, you will receive your results only by: Marland Kitchen MyChart Message (if you have MyChart) OR . A paper copy in the mail If you have any lab test that is abnormal or we need to change your treatment, we will call you to review the results.  Testing/Procedures: Your physician has requested that you have a lexiscan myoview. For further information please visit [email or web address]. Please follow instruction sheet, as given.   PLEASE SCHEDULE FOR 2-3 WEEKS    Follow-Up: At [facility], you and your health needs are our priority.  As part of our continuing mission to provide you with exceptional heart care, we have created designated Provider Care Teams.  These Care Teams include your primary Cardiologist (physician) and Advanced Practice Providers (APPs -  Physician Assistants and Nurse Practitioners) who all work together to provide you with the care you need, when you need it.  Your next appointment:   As scheduled-06/21-2021 at 11:40AM   The format for your next appointment:   In Person  Provider:   Shelva Majestic, MD  Other Instructions

## 2019-06-21 ENCOUNTER — Telehealth: Payer: Self-pay

## 2019-06-21 NOTE — Telephone Encounter (Signed)
Called patient- explained that switching from one med to another can cause the dizziness.  I advised him to try the medication again and check his BP- and let us know if he does not see any improvement.  Patient verbalized understanding, was thankful for call back.

## 2019-06-25 DIAGNOSIS — E782 Mixed hyperlipidemia: Secondary | ICD-10-CM | POA: Diagnosis not present

## 2019-06-26 LAB — LIPID PANEL
Chol/HDL Ratio: 2.2 ratio (ref 0.0–5.0)
Cholesterol, Total: 124 mg/dL (ref 100–199)
HDL: 56 mg/dL (ref >39)
LDL Chol Calc (NIH): 55 mg/dL (ref 0–99)
Triglycerides: 61 mg/dL (ref 0–149)
VLDL Cholesterol Cal: 13 mg/dL (ref 5–40)

## 2019-06-26 LAB — COMPREHENSIVE METABOLIC PANEL
ALT: 20 IU/L (ref 0–44)
AST: 20 IU/L (ref 0–40)
Albumin/Globulin Ratio: 2.3 — ABNORMAL HIGH (ref 1.2–2.2)
Albumin: 4.5 g/dL (ref 3.7–4.7)
Alkaline Phosphatase: 62 IU/L (ref 48–121)
BUN/Creatinine Ratio: 23 (ref 10–24)
BUN: 30 mg/dL — ABNORMAL HIGH (ref 8–27)
Bilirubin Total: 0.8 mg/dL (ref 0.0–1.2)
CO2: 24 mmol/L (ref 20–29)
Calcium: 9.6 mg/dL (ref 8.6–10.2)
Chloride: 103 mmol/L (ref 96–106)
Creatinine, Ser: 1.28 mg/dL — ABNORMAL HIGH (ref 0.76–1.27)
GFR calc Af Amer: 64 mL/min/{1.73_m2} (ref >59)
GFR calc non Af Amer: 56 mL/min/{1.73_m2} — ABNORMAL LOW (ref >59)
Globulin, Total: 2 g/dL (ref 1.5–4.5)
Glucose: 109 mg/dL — ABNORMAL HIGH (ref 65–99)
Potassium: 5 mmol/L (ref 3.5–5.2)
Sodium: 139 mmol/L (ref 134–144)
Total Protein: 6.5 g/dL (ref 6.0–8.5)

## 2019-07-09 ENCOUNTER — Ambulatory Visit: Payer: Medicare PPO | Admitting: Cardiovascular Disease

## 2019-07-09 ENCOUNTER — Other Ambulatory Visit: Payer: Self-pay

## 2019-07-09 ENCOUNTER — Encounter: Payer: Self-pay | Admitting: Cardiovascular Disease

## 2019-07-09 DIAGNOSIS — I10 Essential (primary) hypertension: Secondary | ICD-10-CM | POA: Diagnosis not present

## 2019-07-09 DIAGNOSIS — I480 Paroxysmal atrial fibrillation: Secondary | ICD-10-CM

## 2019-07-09 DIAGNOSIS — I4510 Unspecified right bundle-branch block: Secondary | ICD-10-CM

## 2019-07-09 DIAGNOSIS — R0609 Other forms of dyspnea: Secondary | ICD-10-CM

## 2019-07-09 DIAGNOSIS — G4733 Obstructive sleep apnea (adult) (pediatric): Secondary | ICD-10-CM

## 2019-07-09 DIAGNOSIS — Z9989 Dependence on other enabling machines and devices: Secondary | ICD-10-CM | POA: Diagnosis not present

## 2019-07-09 DIAGNOSIS — E782 Mixed hyperlipidemia: Secondary | ICD-10-CM | POA: Diagnosis not present

## 2019-07-09 DIAGNOSIS — R0600 Dyspnea, unspecified: Secondary | ICD-10-CM

## 2019-07-09 DIAGNOSIS — Z7901 Long term (current) use of anticoagulants: Secondary | ICD-10-CM | POA: Diagnosis not present

## 2019-07-09 MED ORDER — OLMESARTAN MEDOXOMIL 40 MG PO TABS: 40.0000 mg | ORAL_TABLET | Freq: Every day | ORAL | 3 refills | Status: DC

## 2019-07-09 NOTE — Progress Notes (Signed)
Patient ID: ABHIMANYU CRUCES, male   DOB: 03-16-1945, 74 y.o.   MRN: [id number]     HPI: KYVON HU is a 74 y.o. male who presents for a 3 month follow-up cardiology evaluation.  Mr. Leighton has a history of hypertension, a remote history of PAF, status post cardioversion in 2007, obstructive sleep apnea for which he uses CPAP 100% of the time, hypertension, and hyperlipidemia. He has had weight fluctuations over the years.  In the past, he also had a history of Mnire's disease.  There was some concern by [name] that perhaps this may have been related to statin use, which led to its discontinuance.  He is not had Mnire's disease in some time.  He underwent left hip replacement surgery by Dr. [name] and now has been able to continue to be active and exercises routinely.   His  blood pressure regimen has been amlodipine/benazepril 10/40 daily, hydrochlorothiazide has been taking 25 Mill grams in the evening and Toprol-XL 100 mg daily. He had atrial of livalo  For hyperlipidemia but developed some diarrhea secondary to this.    He has not had much success with weight loss over the past several years.    He continues to use his CPAP therapy with 100% compliance.  He will not even take a nap without his CPAP therapy and he takes his CPAP unit on alll his travels.   Since initiating CPAP therapy, he is unaware of any palpitations.  He denies any recurrent atrial fibrillation.  In the past he has had difficulty with family stress and his father had passed away, and his wife's mother recently died after having developed lung CA and had significant PVD.  He was not exercising as much as he had in the past.  He has not been successful in weight loss.  He continues to be active and still walks 3-4 miles per day.  Oftentimes in the morning when he awakens his blood pressure is elevated at 150/90 but after exercise it drops to 128/80.  He is unaware of any recurrent atrial fibrillation.  He continues  to use CPAP with 100% compliance.   He completed a two-year grieving process.  On 01/19/2016 he committed to weight loss.  On January 1, he weighed 278 pounds .  He has been walking 4 miles 5 days per week as well as some intermittent stationary bike.  He has lost a total of 32 pounds since January.  He continues to use CPAP with 100% compliance and since he has been on CPAP, he has not had any recurrent atrial fibrillation and his blood pressure has been less labile.  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  He has had failures to Crestor and Lipitor in the past due to significant myalgias, development, even with weekly dosing.  I resumed zetia   In August 2018 he noticed his heart rate increasing and he presented for evaluation and was seen by [name], PAC.  He admitted to a rare palpitation.  He denied any chest pain with exertion or dyspnea on exertion.  He has suffered a broken rib.  This year.  He has a weight goal of 220 pounds.    I  saw him in September 2018, at which time he was doing well from a cardiovascular standpoint.   His father was ill and ultimately passed away in [place].  As result he was going back and forth to the [facility] area.  This resulted  in a change in his diet and ability to exercise.  I last saw him in October 2019 and his weight had increased from 240 back up to 270 pounds.  He was unaware of any episodes of atrial fibrillation.  He was using CPAP with 100% compliance along with his naps.  He was committed to begin weight loss again.    Since I saw him on October 19, 2018 he has had major lifestyle change.  He has been working with Dr. Leafy Ro in the healthy weight and wellness group.  Peak weight was 280 and most recent weight 244.  I saw him for a cardiology evaluation on August 09, 2018.  O ver the past 4 months, he had been under increased stress as result of his wife's illness.  He has been checking his blood pressure regularly and this has been  stable but on his blood pressure recording he has noticed the message of heart rate irregularity.  He is unaware of A. fib but has noticed this message for the last 3 to 4 months.  He is exercising every day and typically walks 2 miles in the morning and in the afternoon or evening does 30 minutes of stationary bike at least 5 to 6 days/week.  He continues to use CPAP.  I obtained a download in the office today from June 22 through August 08, 2018.  He is 100% compliant.  He has a ResMed air sense 10 auto unit with a pressure range of 8-20 with 95% pressure at 10.1 and maximum average and 11.5.  AHI is excellent at 1.2.     During his August 09, 2018 encounter his ECG demonstrated that he was back in atrial fibrillation and had a ventricular rate at 89 bpm.  There was LVH with repolarization changes.  At that time, I recommended discontinuance of amlodipine/benazepril combination and in its place started Cardizem CD 180 mg rather than amlodipine and olmesartan 20 mg rather than benazepril.  I initiated anticoagulation with Eliquis 5 mg twice a day.  He underwent a 2D echo Doppler study on August 16, 2018 which showed EF low normal at 50 to 55%.  There was evidence for significant LVH and moderate dilation of his left atrium.  I saw him for follow-up evaluation on August 23, 2018.  At that time he was now cognizant of his heart rate irregularity.  He has continued to use CPAP with excellent compliance.  A new download was obtained from July 5 through August 21, 2018 which shows 100% compliance with average usage 7 hours and 28 minutes.  AHI is 1.6 and his 95th percentile auto pressure is 10.1 with a maximum average pressure of 11.4.  He states he has lost weight over the past several weeks purposefully.  He continues to exercise now on a stationary bike.  He denies any chest pain PND orthopnea.  He was tolerating Eliquis without bleeding.  During that evaluation, we discussed different options including several  additional weeks of increased weight control versus initiating antiarrhythmic therapy.  Since his only other episode of atrial fibrillation previously occurred 13 years ago and he had not had any recurrence until this year he opted for an increased rate control trial.  As result metoprolol dose was further titrated.  I saw on September 15, 2018 for follow-up evaluation.  At that time remained in atrial fibrillation despite his increase metoprolol succinate to 100 mg in the morning and 50 mg at night with continuation of diltiazem 180  mg, spironolactone 12.5 mg daily in addition to his olmesartan 20 mg.  I scheduled him for DC cardioversion but because of the need to obtain a COVID test I was unable to schedule this to be done by me prior to going on vacation.  He ultimately underwent the successful cardioversion by Dr. Davina Poke successfully and received 1 shock of 200 J of biphasic synchronized rhythm with restoration of sinus rhythm.  Chemistry profile done prior to the cardioversion revealed his creatinine had increased to 1.69 and as result I recommended he reduce his olmesartan down to 10 mg from his dose of 20 mg.  I saw him on October 10, 2018 in follow-up of his cardioversion.  At that time he was maintaining sinus rhythm and feeling well.  He had more energy.  His stress level had significantly reduced since his wife had successful L5-S1 back surgery by Dr. Vertell Limber.  He was continuing to use CPAP with 100% compliance.    I evaluated him on January 09, 2019.  At that time he stated that over the 3 months previous he had continued to feel well and remained asymptomatic.  At times there are still periods of increased stress.  He continued to use CPAP with 100% compliance.  A download was obtained from November 22 through January 08, 2019 which showed 100% compliance.  Average usage was 6 hours 47 minutes.  AHI is excellent at 1.4 with his AutoSet CPAP minimum pressure set at 8 and maximum of 20, with 95th  percentile pressure 10.2 with maximum average pressure 12.0.  During his evaluation, his ECG verify that he was back in atrial fibrillation.  At the time, he did not inform me that he had noted some irregularity but retrospectively this may have been going on for several weeks prior to that evaluation.  He was continuing to walk daily and exercising on his bike and was asymptomatic without shortness of breath.  During that evaluation I had a long discussion with him regarding possible EP evaluation for consideration of ablation.  After his significant discussion elected to initiate an attempt at antiarrhythmic therapy with low-dose flecainide initially at 50 mg twice a day with plans for office visit in 2 weeks.  He has continued to be on Eliquis for anticoagulation in addition to Zetia for his hyperlipidemia.  When I saw him on January 24, 2019 he stated that he had felt some fatigue once flecainide was instituted.  He was feeling well but this past Sunday he had been working very hard going up and down numerous steps carrying boxes with heavy exertion making at least 40 trips carrying Christmas girls back upstairs.  He was tired.  Sunday evening while sitting down he became disoriented anxious and it appeared that there was possibly some transient stress of aphasia.  He denied any focal weakness.  His symptoms ultimately resolved after 30 minutes.  He had called the office during the workweek and was concerned about his symptoms possibly secondary to flecainide.  He had self reduced his flecainide dose to just once a day rather than twice a day and is worked into my schedule today for follow-up evaluation.  Presently, he has no residual issues since that event several days ago.    At his January 2021 evaluation, I recommended he undergo a neurologic evaluation with Dr. Leonie Man as well as discussed an EP evaluation for possible consideration of future atrial fibrillation ablation.  He saw Dr. Curt Bears on February 08, 2019 and was  in persistent atrial fibrillation.  Was discussion concerning continuing flecainide with subsequent high voltage cardioversion.  He saw Dr. Leonie Man February 27, 2019 and after a long evaluation it was felt that his transient episode of word finding difficulty and anomia likely represented mild cognitive impairment rather than TIA or seizure.  He subsequently saw Dr Curt Bears back on March 05, 2019 and on 323 he underwent successful cardioversion with Dr. Oval Linsey which required 3 shocks with restoration of sinus rhythm.  I last saw him in March 2021 at which time he felt his heart rhythm had remained stable.    He was under considerable increased stress to his wife had fallen in sustained a fracture to her humerus as well as tear to her rotator cuff and biceps tendon.  Ul diltiazem was discontinued and he was started on amlodipine 5 mg and was told to continue olmesartan 20 mg in addition to his metoprolol and spironolactone.  Timately she underwent surgery.  He denies chest pain PND orthopnea.  He has continued to use CPAP download from March 05, 2019 through April 03, 2019 continues to show excellent compliance with an AHI of 2.7 and 95th percentile pressure 11.4 cm.   Since I last saw him, he was evaluated by Roby Lofts June 20, 2019.  At that time, he was getting to stroke with his CPAP but seen for the first 10 minutes before he was able to settle down and fall asleep.  He denies chest pain or shortness of breath.  He had noted some elevated blood pressures as well as some dyspnea on exertion.  During that evaluation his diltiazem was discontinued and he was started on amlodipine 5 mg.  He was told to continue his present dose of olmesartan, metoprolol, spironolactone.  During that evaluation he was maintaining sinus rhythm but had bradycardia and first-degree heart block.  Flecainide was reduced down to 50 mg twice a day since his symptoms of fatigability and dyspnea seem to occur at  the higher dose.  With return to a lower dose his symptoms resolved.  Over the past 3 months, he denies any episodes of chest pain.  He continues to have issues in the first 10 minutes of his CPAP use which I suspect may be related to not having a ramp time on his machine which can easily be adjusted.  His blood pressure does continue to be somewhat labile but he states at home most of the time it is controlled.  He is unaware of any recurrent episodes of atrial fibrillation.  He denies presyncope or syncope.  He denies neurologic symptoms.  He presents for reevaluation.  Past Medical History:  Diagnosis Date   Arthritis    hips    Atrial fibrillation (HCC)    Cataract    CPAP (continuous positive airway pressure) dependence    Dysrhythmia    PAF- hx of 7 years ago    HLD (hyperlipidemia)    Hypertension 05/29/10   ECHO-EF 67% wnl   Meniere disease    Obesity    Palpitations    Sleep apnea 09/14/04   [facility]- Dr. Humphrey Rolls; CPAP titration 11/12/04- Dr. Humphrey Rolls   Stroke [facility])     Past Surgical History:  Procedure Laterality Date   CARDIAC CATHETERIZATION  11/03/05   CARDIOVERSION N/A 09/26/2018   Procedure: CARDIOVERSION;  Surgeon: Geralynn Rile, MD;  Location: [facility];  Service: Endoscopy;  Laterality: N/A;   CARDIOVERSION N/A 03/13/2019   Procedure: CARDIOVERSION;  Surgeon:  Skeet Latch, MD;  Location: [facility] ENDOSCOPY;  Service: Cardiovascular;  Laterality: N/A;   CATARACT EXTRACTION  2014   KNEE ARTHROSCOPY     left knee- 2002    TOTAL HIP ARTHROPLASTY  06/08/2011   Procedure: TOTAL HIP ARTHROPLASTY ANTERIOR APPROACH;  Surgeon: [name], MD;  Location: [facility];  Service: Orthopedics;  Laterality: Left;    Allergies  Allergen Reactions   Rosuvastatin     Other reaction(s): GI Upset (intolerance) Crestor - Intestinal issues    Statins Other (See Comments)    Crestor - Intestinal issues     Current Outpatient Medications   Medication Sig Dispense Refill   amLODipine (NORVASC) 5 MG tablet Take 1 tablet (5 mg total) by mouth daily. 180 tablet 3   Ascorbic Acid (VITAMIN C) 1000 MG tablet Take 1,000 mg by mouth daily.      aspirin EC 81 MG tablet Take 1 tablet (81 mg total) by mouth daily. 90 tablet 3   Boswellia-Glucosamine-Vit D (GLUCOSAMINE COMPLEX PO) Take 3 tablets by mouth daily.      Coenzyme Q10 200 MG capsule Take 200 mg by mouth daily.      ELIQUIS 5 MG TABS tablet TAKE 1 TABLET BY MOUTH TWICE A DAY 180 tablet 2   ezetimibe (ZETIA) 10 MG tablet TAKE 1 TABLET BY MOUTH EVERY DAY 90 tablet 3   flecainide (TAMBOCOR) 100 MG tablet Take 0.5 tablets (50 mg total) by mouth 2 (two) times daily. 30 tablet 11   LIVALO 2 MG TABS TAKE 1 TABLET BY MOUTH EVERY DAY 90 tablet 3   metFORMIN (GLUCOPHAGE) 500 MG tablet Take 1 tablet (500 mg total) by mouth daily with breakfast. 30 tablet 0   metoprolol succinate (TOPROL-XL) 100 MG 24 hr tablet Take 100 mg in the evenings (along with 25 mg or 50 mg in the mornings) (Patient taking differently: Take 100 mg by mouth. Take 100 mg in the evenings) 90 tablet 3   Multiple Vitamin (MULITIVITAMIN WITH MINERALS) TABS Take 1 tablet by mouth daily.      olmesartan (BENICAR) 40 MG tablet Take 1 tablet (40 mg total) by mouth daily. 90 tablet 3   Omega 3-6-9 Fatty Acids (OMEGA-3-6-9 PO) Take 3 capsules by mouth every morning. Omega 3= 820m,omega 6=3816momega 9 =30037m   spironolactone (ALDACTONE) 25 MG tablet TAKE 1/2 TABLET BY MOUTH EVERY DAY 45 tablet 3   tamsulosin (FLOMAX) 0.4 MG CAPS capsule Take 0.4 mg by mouth daily.     zinc gluconate 50 MG tablet Take 50 mg by mouth daily.      No current facility-administered medications for this visit.    Socially he is remarried after his first wife passed away. He has 2 children. 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 is no tobacco history.  He plays golf at least 2 days/week and this past year  not only did he have a hole in 1 but he has also shot his age.  ROS General: Negative; No fevers, chills, or night sweats;  HEENT: Negative; No changes in vision or hearing, sinus congestion, difficulty swallowing Pulmonary: Negative; No cough, wheezing, shortness of breath, hemoptysis Cardiovascular: See HPI GI: Negative; No nausea, vomiting, diarrhea, or abdominal pain GU: Negative; No dysuria, hematuria, or difficulty voiding Musculoskeletal: He is status post rib fracture.  He is status post hip replacement. Hematologic/Oncology: Negative; no easy bruising, bleeding Endocrine: Negative; no heat/cold intolerance; no diabetes Neuro: No vertigo or imbalance  issues Skin: Negative; No rashes or skin lesions Psychiatric: Negative; No behavioral problems, depression Sleep: Positive for obstructive sleep apnea on CPAP therapy with 100% compliance No snoring, daytime sleepiness, hypersomnolence, bruxism, restless legs, hypnogognic hallucinations, no cataplexy Other comprehensive 14 point system review is negative.  PE BP (!) 162/100    Pulse 70    Ht _0  (1.905 m)    Wt 247 lb (112 kg)    BMI 30.87 kg/m    Repeat blood pressure by me was 162/92  Wt Readings from Last 3 Encounters:  07/09/19 247 lb (112 kg)  06/20/19 247 lb (112 kg)  06/20/19 247 lb (112 kg)  He  has lost 40 pounds over the past year.  General: Alert, oriented, no distress.  Skin: normal turgor, no rashes, warm and dry HEENT: Normocephalic, atraumatic. Pupils equal round and reactive to light; sclera anicteric; extraocular muscles intact;  Nose without nasal septal hypertrophy Mouth/Parynx benign; Mallinpatti scale 3 Neck: No JVD, no carotid bruits; normal carotid upstroke Lungs: clear to ausculatation and percussion; no wheezing or rales Chest wall: without tenderness to palpitation Heart: PMI not displaced, RRR, s1 s2 normal, 1/6 systolic murmur, no diastolic murmur, no rubs, gallops, thrills, or heaves Abdomen:  mild central adiposity; soft, nontender; no hepatosplenomehaly, BS+; abdominal aorta nontender and not dilated by palpation. Back: no CVA tenderness Pulses 2+ Musculoskeletal: full range of motion, normal strength, no joint deformities Extremities: no clubbing cyanosis or edema, Homan's sign negative  Neurologic: grossly nonfocal; Cranial nerves grossly wnl Psychologic: Normal mood and affect   ECG (independently read by me): Normal sinus rhythm at 70 bpm, first-degree AV block with a.  PR interval at 212 ms.  Right bundle branch block with repolarization changes.  Borderline LVH by voltage criteria.  QTc interval 470 ms.  April 04, 2019 ECG (independently read by me): Sinus bradycardia 59 bpm, first-degree AV block with a PR of 248 ms.  LVH.  Inferior Q waves, unchanged  January 24, 2019 ECG (independently read by me): Atrial fibrillation at 83 bpm.  Borderline LVH by voltage criteria in aVL; QTc interval 446 ms.  January 09, 2019 ECG (independently read by me): Atrial fibrillation ar 97, RBBB; Left axis deviation; Inferior Q waves, unchanged. QTc 487    August 09, 2018 ECG (independently read by me): Normal sinus rhythm at 72 bpm.  Left Axis deviation.  QTc interval 429 ms.  September 15, 2018 ECG (independently read by me): Atrial fibrillation at 92 bpm.  Right bundle branch block with repolarization changes.  August 23, 2018 ECG (independently read by me): Atrial fibrillation at 96 bpm.  LVH with repolarization changes.  Left axis deviation.  Left anterior hemiblock.  Inferior Q waves  August 09, 2018 ECG (independently read by me):Atrial fibrillatoion at 50; LVH with repolarization  October 219 ECG (independently read by me): Normal sinus rhythm at 81 bpm.  Nonspecific intraventricular conduction delay.  Left axis deviation  September 2018 ECG (independently read by me): Normal sinus rhythm at 63 bpm.  Left bundle branch block with repolarization changes.  Normal intervals.  No  ectopy.  April 2018 ECG (independently read by me): Normal sinus rhythm at 68 bpm.  LVH with repolarization.  Normal intervals.  November 2017 ECG (independently read by me): Normal sinus rhythm at 78 bpm.  Nonspecific interventricular delay.  QRS complex V1 V2.  August 2016 ECG (independently read by me): Normal sinus rhythm at 77 bpm.  LVH with repolarization changes.  Left axis deviation.  December 2015 ECG (independently read by me): Normal sinus rhythm at 87 bpm.  LVH by voltage criteria with T-wave changes in leads 1 and aVL.  July 2014 ECG: Normal sinus rhythm at 76 beats per minute. LVH with QRS widening.  LABS:  BMP Latest Ref Rng & Units 06/25/2019 03/13/2019 03/05/2019  Glucose 65 - 99 mg/dL 109(H) 121(H) 120(H)  BUN 8 - 27 mg/dL 30(H) 30(H) 22  Creatinine 0.76 - 1.27 mg/dL 1.28(H) 1.50(H) 1.35(H)  BUN/Creat Ratio 10 - 24 23 - 16  Sodium 134 - 144 mmol/L 139 137 138  Potassium 3.5 - 5.2 mmol/L 5.0 4.5 4.8  Chloride 96 - 106 mmol/L 103 105 102  CO2 20 - 29 mmol/L 24 - 23  Calcium 8.6 - 10.2 mg/dL 9.6 - 10.1   Hepatic Function Latest Ref Rng & Units 06/25/2019 01/09/2019 01/05/2019  Total Protein 6.0 - 8.5 g/dL 6.5 6.6 6.7  Albumin 3.7 - 4.7 g/dL 4.5 4.5 4.6  AST 0 - 40 IU/L _0 ALT 0 - 44 IU/L _1 Alk Phosphatase 48 - 121 IU/L 62 64 65  Total Bilirubin 0.0 - 1.2 mg/dL 0.8 0.8 0.7   CBC Latest Ref Rng & Units 03/13/2019 03/05/2019 09/22/2018  WBC 3.4 - 10.8 x10E3/uL - 4.8 6.1  Hemoglobin 13.0 - 17.0 g/dL 15.6 15.4 15.4  Hematocrit 39 - 52 % 46.0 45.3 46.2  Platelets 150 - 450 x10E3/uL - 168 187   Lab Results  Component Value Date   MCV 90 03/05/2019   MCV 95 09/22/2018   MCV 92 08/09/2018   Lab Results  Component Value Date   TSH 1.290 08/09/2018    Lab Results  Component Value Date   HGBA1C 5.8 (H) 09/04/2018    Lipid Panel     Component Value Date/Time   CHOL 124 06/25/2019 1014   TRIG 61 06/25/2019 1014   HDL 56 06/25/2019 1014   CHOLHDL 2.2  06/25/2019 1014   CHOLHDL 3.8 10/12/2016 0902   VLDL 17 04/14/2016 0937   LDLCALC 55 06/25/2019 1014   LDLCALC 119 (H) 10/12/2016 0902     RADIOLOGY: No results found.  IMPRESSION:  1. Essential hypertension   2. Paroxysmal atrial fibrillation (HCC)   3. OSA on CPAP   4. Anticoagulated   5. Mixed hyperlipidemia   6. RBBB   7. DOE (dyspnea on exertion)        ASSESSMENT AND PLAN: Mr Archambeau is a 74 year old gentleman who has a long-standing history of hypertension and had been on amlodipine/benazepril 10/40, HCTZ 25 mg, Toprol-XL 100 mg in addition to spironolactone 12.5 mg daily.  He had had a prior isolated episode of atrial fibrillation in 2007 and had undergone cardioversion.  On this regimen his blood pressure had stabilized and consistently was well controlled and he had not had any recurrent episodes of atrial fibrillation.   He had gained significant amount of weight since August 2019.  He initiated a major lifestyle change and has been working closely with Dr. Leafy Ro from healthy weight and wellness group resulting in a greater than 40 pound weight loss over the past year.  When I saw him in late July 2020 he was in atrial fibrillation of questionable duration.  At the time retrospectively he believes he may have been in A. fib for 3 to 4 months duration.  I gradually titrated his medical regimen and changed his benazepril to diltiazem and olmesartan to take in addition to  metoprolol succinate and his spironolactone 12.5 mg daily.  He had continued to use CPAP with 100% compliance.  His only previous episode of A. fib prior to this episode had occurred in 2007 and he had maintained sinus rhythm until this past year.  He underwent successful cardioversion on September 26, 2018 with restoration of sinus rhythm.  When I saw him on December 22 he was continuing to admit to having periods of increased stress ECG again showed recurrent atrial fibrillation of questionable duration.   Retrospectively he felt he may have been in atrial fibrillation for 3 to 4 weeks duration prior to that visit.  He also had an episode of disorientation with possible transient expressive aphasia and underwent neurologic evaluation with Dr. Leonie Man and also EP evaluation with Dr. Curt Bears.  He was felt that his word finding difficulty and anomia most likely represented mild cognitive impairment rather than a TIA or stroke.  He had been on increased dose of flecainide up to 100 mg twice a day after his subsequent cardioversion.  Most recently he is now back down to 50 mg twice a day and feels that his prior symptoms of shortness of breath and fatigability are markedly improved.  His most recent ECG has shown sinus bradycardia at 55 bpm with first-degree AV block and right bundle branch block.  His blood pressure today is elevated.  I am recommending further titration of olmesartan to 40 mg daily and he will continue his current regimen of spironolactone 12.5 mg in addition to metoprolol succinate 100 mg daily and amlodipine 5 mg.  He continues to be on Eliquis for anticoagulation and is without bleeding.  I obtained a new download of his CPAP use.  Average use is 7 hours and 37 minutes and he is meeting compliance standards.  However, his ramp time was not on an his auto pressure minimum is 8 with a maximum of 20.  I am putting him back on a ramp time of 10 minutes and suggested that this can be even longer but he felt by 10 minutes he typically is asleep and this will allow him time to get adjusted to the initial minimum pressure of 8 cm with the ramp starting at 4 cm pressure.  I will see him in 3 months for follow-up evaluation.  [name], MD, [facility]  07/11/2019 2:34 PM

## 2019-07-09 NOTE — Patient Instructions (Signed)
Medication Instructions:  BEGIN TAKING OLMESARTAN 40 MG  *If you need a refill on your cardiac medications before your next appointment, please call your pharmacy*  Follow-Up: At [facility], you and your health needs are our priority.  As part of our continuing mission to provide you with exceptional heart care, we have created designated Provider Care Teams.  These Care Teams include your primary Cardiologist (physician) and Advanced Practice Providers (APPs -  Physician Assistants and Nurse Practitioners) who all work together to provide you with the care you need, when you need it.  We recommend signing up for the patient portal called "MyChart".  Sign up information is provided on this After Visit Summary.  MyChart is used to connect with patients for Virtual Visits (Telemedicine).  Patients are able to view lab/test results, encounter notes, upcoming appointments, etc.  Non-urgent messages can be sent to your provider as well.   To learn more about what you can do with MyChart, go to [email or web address].    Your next appointment:   3 month(s)  The format for your next appointment:   In Person  Provider:   Shelva Majestic, MD

## 2019-07-10 ENCOUNTER — Other Ambulatory Visit (HOSPITAL_COMMUNITY): Payer: Medicare PPO

## 2019-07-11 ENCOUNTER — Encounter: Payer: Self-pay | Admitting: Cardiovascular Disease

## 2019-07-11 ENCOUNTER — Telehealth (HOSPITAL_COMMUNITY): Payer: Self-pay

## 2019-07-11 NOTE — Telephone Encounter (Signed)
Encounter complete. 

## 2019-07-13 ENCOUNTER — Ambulatory Visit (HOSPITAL_COMMUNITY)
Admission: RE | Admit: 2019-07-13 | Discharge: 2019-07-13 | Disposition: A | Discharge status: Home / Self Care | Payer: Medicare PPO | Source: Ambulatory Visit | Attending: Cardiology | Admitting: Cardiology

## 2019-07-13 ENCOUNTER — Other Ambulatory Visit: Payer: Self-pay

## 2019-07-13 DIAGNOSIS — R0600 Dyspnea, unspecified: Secondary | ICD-10-CM

## 2019-07-13 LAB — MYOCARDIAL PERFUSION IMAGING
Estimated workload: 7 METS
Exercise duration (min): 5 min
Exercise duration (sec): 10 s
LV dias vol: 170 mL (ref 62–150)
LV sys vol: 90 mL
MPHR: 148 {beats}/min
Peak HR: 144 {beats}/min
Percent HR: 97 %
Rest HR: 64 {beats}/min
SDS: 4
SRS: 11
SSS: 15
TID: 0.93

## 2019-07-13 MED ORDER — TECHNETIUM TC 99M TETROFOSMIN IV KIT
10.1000 | PACK | Freq: Once | INTRAVENOUS | Status: AC | PRN
  Administered 2019-07-13: 10.1 via INTRAVENOUS
  Filled 2019-07-13: qty 11

## 2019-07-13 MED ORDER — TECHNETIUM TC 99M TETROFOSMIN IV KIT
32.2000 | PACK | Freq: Once | INTRAVENOUS | Status: AC | PRN
  Administered 2019-07-13: 32.2 via INTRAVENOUS
  Filled 2019-07-13: qty 33

## 2019-07-16 ENCOUNTER — Ambulatory Visit (INDEPENDENT_AMBULATORY_CARE_PROVIDER_SITE_OTHER): Payer: Medicare PPO | Admitting: Family Medicine

## 2019-07-18 ENCOUNTER — Other Ambulatory Visit: Payer: Self-pay | Admitting: Cardiovascular Disease

## 2019-07-30 ENCOUNTER — Encounter (INDEPENDENT_AMBULATORY_CARE_PROVIDER_SITE_OTHER): Payer: Self-pay | Admitting: Family Medicine

## 2019-07-30 NOTE — Telephone Encounter (Signed)
Please advise 

## 2019-08-01 ENCOUNTER — Other Ambulatory Visit: Payer: Self-pay

## 2019-08-01 ENCOUNTER — Ambulatory Visit (INDEPENDENT_AMBULATORY_CARE_PROVIDER_SITE_OTHER): Payer: Medicare PPO | Admitting: Family Medicine

## 2019-08-01 ENCOUNTER — Encounter (INDEPENDENT_AMBULATORY_CARE_PROVIDER_SITE_OTHER): Payer: Self-pay | Admitting: Family Medicine

## 2019-08-01 VITALS — BP 160/94 | HR 55 | Temp 97.7°F | Ht 75.0 in | Wt 244.0 lb

## 2019-08-01 DIAGNOSIS — E669 Obesity, unspecified: Secondary | ICD-10-CM | POA: Diagnosis not present

## 2019-08-01 DIAGNOSIS — E559 Vitamin D deficiency, unspecified: Secondary | ICD-10-CM

## 2019-08-01 DIAGNOSIS — I10 Essential (primary) hypertension: Secondary | ICD-10-CM | POA: Diagnosis not present

## 2019-08-01 DIAGNOSIS — R7303 Prediabetes: Secondary | ICD-10-CM

## 2019-08-01 DIAGNOSIS — Z6830 Body mass index (BMI) 30.0-30.9, adult: Secondary | ICD-10-CM

## 2019-08-01 MED ORDER — METFORMIN HCL 500 MG PO TABS: 500.0000 mg | ORAL_TABLET | Freq: Every day | ORAL | 0 refills | Status: DC

## 2019-08-02 LAB — VITAMIN D 25 HYDROXY (VIT D DEFICIENCY, FRACTURES): Vit D, 25-Hydroxy: 37.6 ng/mL (ref 30.0–100.0)

## 2019-08-02 LAB — HEMOGLOBIN A1C
Est. average glucose Bld gHb Est-mCnc: 120 mg/dL
Hgb A1c MFr Bld: 5.8 % — ABNORMAL HIGH (ref 4.8–5.6)

## 2019-08-02 LAB — INSULIN, RANDOM: INSULIN: 3.9 u[IU]/mL (ref 2.6–24.9)

## 2019-08-03 DIAGNOSIS — G4733 Obstructive sleep apnea (adult) (pediatric): Secondary | ICD-10-CM | POA: Diagnosis not present

## 2019-08-06 NOTE — Progress Notes (Signed)
Chief Complaint:   OBESITY Clayton [name] is here to discuss his progress with his obesity treatment plan along with follow-up of his obesity related diagnoses. Clayton [name] is on keeping a food journal and adhering to recommended goals of 1800 calories and 100+ grams of protein daily and states he is following his eating plan approximately 10-15% of the time. Clayton [name] states he is doing 0 minutes 0 times per week.  Today's visit was #: 21 Starting weight: 266 lbs Starting date: 11/01/2017 Today's weight: 244 lbs Today's date: 08/01/2019 Total lbs lost to date: 22 Total lbs lost since last in-office visit: 0  Interim History: Clayton [name] has been doing a lot of traveling recently, and he plans to travel more in the next 6 weeks. He would like to discuss travel strategies.  Subjective:   1. Pre-diabetes Clayton [name] is stable on metformin, and he is due for labs.  2. Essential hypertension Clayton [name] didn't have his medications yet this morning, and his blood pressure is elevated.  3. Vitamin D deficiency Clayton [name] is on Vit D, and he is due for labs.  Assessment/Plan:   1. Pre-diabetes Clayton [name] will continue to work on weight loss, exercise, and decreasing simple carbohydrates to help decrease the risk of diabetes. We will check labs today, and we will refill metformin for 1 month.  - metFORMIN (GLUCOPHAGE) 500 MG tablet; Take 1 tablet (500 mg total) by mouth daily with breakfast.  Dispense: 30 tablet; Refill: 0 - Hemoglobin A1c - Insulin, random  2. Essential hypertension Clayton [name] will continue his medications, and will continue working on healthy weight loss and exercise to improve blood pressure control. We will watch for signs of hypotension as he continues his lifestyle modifications.   3. Vitamin D deficiency Low Vitamin D level contributes to fatigue and are associated with obesity, breast, and colon cancer. We will check labs today, and Clayton [name] will follow-up for routine testing of Vitamin D, at least  2-3 times per year to avoid over-replacement.  - VITAMIN D 25 Hydroxy (Vit-D Deficiency, Fractures)  4. Class 1 obesity with serious comorbidity and body mass index (BMI) of 30.0 to 30.9 in adult, unspecified obesity type Clayton [name] is currently in the action stage of change. As such, his goal is to continue with weight loss efforts. He has agreed to keeping a food journal and adhering to recommended goals of 1800 calories and 120 grams of protein daily.   Behavioral modification strategies: travel eating strategies.  Clayton [name] has agreed to follow-up with our clinic in 6 weeks. He was informed of the importance of frequent follow-up visits to maximize his success with intensive lifestyle modifications for his multiple health conditions.   Clayton [name] was informed we would discuss his lab results at his next visit unless there is a critical issue that needs to be addressed sooner. Clayton [name] agreed to keep his next visit at the agreed upon time to discuss these results.  Objective:   Blood pressure (!) 160/94, pulse (!) 55, temperature 97.7 F (36.5 C), temperature source Oral, height 6\' 3"  (1.905 m), weight 244 lb (110.7 kg), SpO2 97 %. Body mass index is 30.5 kg/m.  General: Cooperative, alert, well developed, in no acute distress. HEENT: Conjunctivae and lids unremarkable. Cardiovascular: Regular rhythm.  Lungs: Normal work of breathing. Neurologic: No focal deficits.   Lab Results  Component Value Date   CREATININE 1.28 (H) 06/25/2019   BUN 30 (H) 06/25/2019   NA 139 06/25/2019   K 5.0 06/25/2019   CL 103  06/25/2019   CO2 24 06/25/2019   Lab Results  Component Value Date   ALT 20 06/25/2019   AST 20 06/25/2019   ALKPHOS 62 06/25/2019   BILITOT 0.8 06/25/2019   Lab Results  Component Value Date   HGBA1C 5.8 (H) 08/01/2019   HGBA1C 5.8 (H) 09/04/2018   HGBA1C 6.0 (H) 11/01/2017   HGBA1C 5.4 04/14/2016   HGBA1C 6.0 06/09/2015   Lab Results  Component Value Date   INSULIN 3.9  08/01/2019   INSULIN 9.0 09/04/2018   INSULIN 22.4 11/01/2017   Lab Results  Component Value Date   TSH 1.290 08/09/2018   Lab Results  Component Value Date   CHOL 124 06/25/2019   HDL 56 06/25/2019   LDLCALC 55 06/25/2019   TRIG 61 06/25/2019   CHOLHDL 2.2 06/25/2019   Lab Results  Component Value Date   WBC 4.8 03/05/2019   HGB 15.6 03/13/2019   HCT 46.0 03/13/2019   MCV 90 03/05/2019   PLT 168 03/05/2019   No results found for: IRON, TIBC, FERRITIN  Obesity Behavioral Intervention Documentation for Insurance:   Approximately 15 minutes were spent on the discussion below.  ASK: We discussed the diagnosis of obesity with Awanda Mink today and Jebidiah agreed to give [name] permission to discuss obesity behavioral modification therapy today.  ASSESS: Mitchael has the diagnosis of obesity and his BMI today is 30.5. Abbott is in the action stage of change.   ADVISE: Fredy was educated on the multiple health risks of obesity as well as the benefit of weight loss to improve his health. He was advised of the need for long term treatment and the importance of lifestyle modifications to improve his current health and to decrease his risk of future health problems.  AGREE: Multiple dietary modification options and treatment options were discussed and Jamauri agreed to follow the recommendations documented in the above note.  ARRANGE: Michele was educated on the importance of frequent visits to treat obesity as outlined per CMS and USPSTF guidelines and agreed to schedule his next follow up appointment today.  Attestation Statements:   Reviewed by clinician on day of visit: allergies, medications, problem list, medical history, surgical history, family history, social history, and previous encounter notes.   I, Trixie Dredge, am acting as transcriptionist for Dennard Nip, MD.  I have reviewed the above documentation for accuracy and completeness, and I agree with the above. -  Dennard Nip, MD

## 2019-08-13 ENCOUNTER — Ambulatory Visit: Payer: Medicare PPO | Admitting: Family Medicine

## 2019-08-13 ENCOUNTER — Other Ambulatory Visit: Payer: Self-pay

## 2019-08-13 VITALS — BP 142/90 | HR 69 | Ht 75.0 in

## 2019-08-13 DIAGNOSIS — M999 Biomechanical lesion, unspecified: Secondary | ICD-10-CM | POA: Diagnosis not present

## 2019-08-13 DIAGNOSIS — M1712 Unilateral primary osteoarthritis, left knee: Secondary | ICD-10-CM

## 2019-08-13 NOTE — Progress Notes (Signed)
[facility] [place] Phone: [phone] Subjective:   Clayton [name], am serving as a scribe for Dr. Hulan Saas. This visit occurred during the SARS-CoV-2 public health emergency.  Safety protocols were in place, including screening questions prior to the visit, additional usage of staff PPE, and extensive cleaning of exam room while observing appropriate contact time as indicated for disinfecting solutions.   I'm seeing this patient by the request  of:  Kuneff, Renee A, DO  CC: Back pain, knee pain follow-up  [id number]  Clayton [name] is a 74 y.o. male coming in with complaint of back and neck pain. OMT 06/20/2019. Patient states that his back is doing well. Is complaining of left knee pain and would like steroid injection today.    Medications patient has been prescribed: Nothing for pain          Reviewed prior external information including notes and imaging from previsou exam, outside providers and external EMR if available.   As well as notes that were available from care everywhere and other healthcare systems.  Past medical history, social, surgical and family history all reviewed in electronic medical record.  [name] pertanent information unless stated regarding to the chief complaint.   Past Medical History:  Diagnosis Date  . Arthritis    hips   . Atrial fibrillation ([place])   . Cataract   . CPAP (continuous positive airway pressure) dependence   . Dysrhythmia    PAF- hx of 7 years ago   . HLD (hyperlipidemia)   . Hypertension 05/29/10   ECHO-EF 67% wnl  . Meniere disease   . Obesity   . Palpitations   . Sleep apnea 09/14/04    [facility]- Dr. Humphrey Rolls; CPAP titration 11/12/04- Dr. Humphrey Rolls  . Stroke [facility])     Allergies  Allergen Reactions  . Rosuvastatin     Other reaction(s): GI Upset (intolerance) Crestor - Intestinal issues   . Statins Other (See Comments)    Crestor -  Intestinal issues      Review of Systems:  [name] headache, visual changes, nausea, vomiting, diarrhea, constipation, dizziness, abdominal pain, skin rash, fevers, chills, night sweats, weight loss, swollen lymph nodes, body aches, joint swelling, chest pain, shortness of breath, mood changes. POSITIVE muscle aches  Objective  There were [name] vitals taken for this visit.   General: [name] apparent distress alert and oriented x3 mood and affect normal, dressed appropriately.  HEENT: Pupils equal, extraocular movements intact  Respiratory: Patient's speak in full sentences and does not appear short of breath  Cardiovascular: [name] lower extremity edema, non tender, [name] erythema  Neuro: Cranial nerves II through XII are intact, neurovascularly intact in all extremities with 2+ DTRs and 2+ pulses.  Gait normal with good balance and coordination.   Left knee exam shows the patient does have tenderness to palpation mostly over the medial joint space.  Instability with valgus and varus force.  Abnormal thigh to calf ratio noted.  Lacks the last 5 degrees of flexion in the last 2 degrees of extension.  Back exam does have some loss of lordosis.  Some mild tightness noted.  Tightness with FABER test.  Near full range of motion.  Still some mild weakness of the hip abductors.  Osteopathic findings   T9 extended rotated and side bent left L5 flexed rotated and side bent left Sacrum right on right   After informed written and verbal consent, patient was  seated on exam table. Left knee was prepped with alcohol swab and utilizing anterolateral approach, patient's left knee space was injected with 4:1  marcaine 0.5%: Kenalog 40mg /dL. Patient tolerated the procedure well without immediate complications.    Assessment and Plan:    Nonallopathic problems  Decision today to treat with OMT was based on Physical Exam  After verbal consent patient was treated with HVLA, ME, FPR techniques in cervical, rib,  thoracic, lumbar, and sacral  areas  Patient tolerated the procedure well with improvement in symptoms  Patient given exercises, stretches and lifestyle modifications  See medications in patient instructions if given  Patient will follow up in 4-8 weeks      The above documentation has been reviewed and is accurate and complete [name], DO       Note: This dictation was prepared with Dragon dictation along with smaller phrase technology. Any transcriptional errors that result from this process are unintentional.

## 2019-08-13 NOTE — Assessment & Plan Note (Signed)
Severe overall.  Patient does have instability.  Does have a custom brace and is encouraged to wear it on a more regular basis.  Discussed the possibility of viscosupplementation and patient will be following up in 1 month to further evaluate.  Chronic problem with exacerbation.

## 2019-08-14 ENCOUNTER — Encounter: Payer: Self-pay | Admitting: Family Medicine

## 2019-08-30 ENCOUNTER — Other Ambulatory Visit (INDEPENDENT_AMBULATORY_CARE_PROVIDER_SITE_OTHER): Payer: Self-pay | Admitting: Family Medicine

## 2019-08-30 DIAGNOSIS — R7303 Prediabetes: Secondary | ICD-10-CM

## 2019-09-04 ENCOUNTER — Other Ambulatory Visit: Payer: Self-pay

## 2019-09-04 MED ORDER — FLECAINIDE ACETATE 100 MG PO TABS: 50.0000 mg | ORAL_TABLET | Freq: Two times a day (BID) | ORAL | 9 refills | Status: DC

## 2019-09-10 ENCOUNTER — Other Ambulatory Visit: Payer: Self-pay | Admitting: Cardiovascular Disease

## 2019-09-11 ENCOUNTER — Other Ambulatory Visit: Payer: Self-pay

## 2019-09-11 ENCOUNTER — Other Ambulatory Visit: Payer: Self-pay | Admitting: Family Medicine

## 2019-09-11 ENCOUNTER — Other Ambulatory Visit: Payer: Self-pay | Admitting: Cardiovascular Disease

## 2019-09-11 ENCOUNTER — Ambulatory Visit: Payer: Medicare PPO | Admitting: Family Medicine

## 2019-09-11 ENCOUNTER — Encounter: Payer: Self-pay | Admitting: Family Medicine

## 2019-09-11 DIAGNOSIS — M1712 Unilateral primary osteoarthritis, left knee: Secondary | ICD-10-CM | POA: Diagnosis not present

## 2019-09-11 NOTE — Progress Notes (Signed)
[place] [address] [place] Phone: [phone] Subjective:   I Clayton [name] am serving as a Education administrator for Dr. Hulan Saas.  This visit occurred during the SARS-CoV-2 public health emergency.  Safety protocols were in place, including screening questions prior to the visit, additional usage of staff PPE, and extensive cleaning of exam room while observing appropriate contact time as indicated for disinfecting solutions.   I'm seeing this patient by the request  of:  Kuneff, Renee A, DO  CC: Left knee pain follow-up  [id number]   08/13/2019 Severe overall.  Patient does have instability.  Does have a custom brace and is encouraged to wear it on a more regular basis.  Discussed the possibility of viscosupplementation and patient will be following up in 1 month to further evaluate.  Chronic problem with exacerbation.  Update 09/11/2019 Clayton [name] is a 74 y.o. male coming in with complaint of left knee pain. Monovisc approved. Been on the road a lot lately and states his knee has started hurting. Says the injection is wearing off.  Patient has been approved for the viscosupplementation.  Would like to consider it  Medications patient has been prescribed: Only topical anti-inflammatories        Reviewed prior external information including notes and imaging from previsou exam, outside providers and external EMR if available.   As well as notes that were available from care everywhere and other healthcare systems.  Past medical history, social, surgical and family history all reviewed in electronic medical record.  No pertanent information unless stated regarding to the chief complaint.   Past Medical History:  Diagnosis Date  . Arthritis    hips   . Atrial fibrillation ([place])   . Cataract   . CPAP (continuous positive airway pressure) dependence   . Dysrhythmia    PAF- hx of 7 years ago   . HLD (hyperlipidemia)   .  Hypertension 05/29/10   ECHO-EF 67% wnl  . Meniere disease   . Obesity   . Palpitations   . Sleep apnea 09/14/04   [place] [facility]- Dr. Humphrey Rolls; CPAP titration 11/12/04- Dr. Humphrey Rolls  . Stroke [facility])     Allergies  Allergen Reactions  . Rosuvastatin     Other reaction(s): GI Upset (intolerance) Crestor - Intestinal issues   . Statins Other (See Comments)    Crestor - Intestinal issues      Review of Systems:  No headache, visual changes, nausea, vomiting, diarrhea, constipation, dizziness, abdominal pain, skin rash, fevers, chills, night sweats, weight loss, swollen lymph nodes, body aches,chest pain, shortness of breath, mood changes. POSITIVE muscle aches  joint swelling, Objective  Blood pressure 128/90, pulse 66, height 6\' 3"  (1.905 m), weight 244 lb (110.7 kg), SpO2 97 %.   General: No apparent distress alert and oriented x3 mood and affect normal, dressed appropriately.  HEENT: Pupils equal, extraocular movements intact  Respiratory: Patient's speak in full sentences and does not appear short of breath  Cardiovascular: No lower extremity edema, non tender, no erythema  Neuro: Cranial nerves II through XII are intact, neurovascularly intact in all extremities with 2+ DTRs and 2+ pulses.  Gait normal with good balance and coordination.  MSK: Knee: left  valgus deformity noted. Large thigh to calf ratio.  Tender to palpation over medial and PF joint line.  ROM full in flexion and extension and lower leg rotation. instability with valgus force.  painful patellar compression. Patellar glide with  moderate crepitus. Patellar and quadriceps tendons unremarkable. Hamstring and quadriceps strength is normal.  After informed written and verbal consent, patient was seated on exam table. Left knee was prepped with alcohol swab and utilizing anterolateral approach, patient's left knee space was injected with 40 mg per 3 mL of Monovisc (sodium hyaluronate) in a prefilled syringe was  injected easily into the knee through a 22-gauge needle..Patient tolerated the procedure well without immediate complications.       Assessment and Plan:      The above documentation has been reviewed and is accurate and complete [name], DO       Note: This dictation was prepared with Dragon dictation along with smaller phrase technology. Any transcriptional errors that result from this process are unintentional.

## 2019-09-11 NOTE — Assessment & Plan Note (Signed)
Viscosupplementation given today, tolerated the procedure well, discussed icing regimen and home exercise, discussed avoiding certain activities, discussed icing regimen.  Patient will continue to wear the brace for stability.  Does have severe arthritic changes and patient wants to avoid any surgical intervention and I am hopeful that this will do relatively well.  Follow-up with me again in 6 weeks

## 2019-09-12 ENCOUNTER — Ambulatory Visit (INDEPENDENT_AMBULATORY_CARE_PROVIDER_SITE_OTHER): Payer: Medicare PPO | Admitting: Family Medicine

## 2019-09-12 ENCOUNTER — Other Ambulatory Visit: Payer: Self-pay

## 2019-09-12 ENCOUNTER — Encounter (INDEPENDENT_AMBULATORY_CARE_PROVIDER_SITE_OTHER): Payer: Self-pay | Admitting: Family Medicine

## 2019-09-12 VITALS — BP 161/89 | HR 60 | Temp 98.0°F | Ht 75.0 in | Wt 258.0 lb

## 2019-09-12 DIAGNOSIS — I10 Essential (primary) hypertension: Secondary | ICD-10-CM | POA: Diagnosis not present

## 2019-09-12 DIAGNOSIS — E669 Obesity, unspecified: Secondary | ICD-10-CM

## 2019-09-12 DIAGNOSIS — R7303 Prediabetes: Secondary | ICD-10-CM | POA: Diagnosis not present

## 2019-09-12 DIAGNOSIS — Z6832 Body mass index (BMI) 32.0-32.9, adult: Secondary | ICD-10-CM | POA: Diagnosis not present

## 2019-09-12 MED ORDER — METFORMIN HCL 500 MG PO TABS: ORAL_TABLET | ORAL | 0 refills | Status: DC

## 2019-09-12 NOTE — Progress Notes (Signed)
Chief Complaint:   OBESITY Clayton [name] is here to discuss his progress with his obesity treatment plan along with follow-up of his obesity related diagnoses. Clayton [name] is on keeping a food journal and adhering to recommended goals of 1800 calories and 120 grams of protein daily and states he is following his eating plan approximately 0% of the time. Clayton [name] states he is doing 0 minutes 0 times per week.  Today's visit was #: 22 Starting weight: 266 lbs Starting date: 11/01/2017 Today's weight: 258 lbs Today's date: 09/12/2019 Total lbs lost to date: 8 Total lbs lost since last in-office visit: 0  Interim History: Kinte has been on a 4 week cross country trip and he did some celebration eating. He is ready to get back on track with both diet and exercise as long as his knee continues to improve after HA injection.  Subjective:   1. Pre-diabetes Clayton [name] is stable on metformin, and he denies nausea or vomiting. He is getting ready to get back on track with his eating plan.  2. Essential hypertension Clayton [name]'s blood pressure is elevated today. He didn't take his medications this morning. He is ready to get back on track with his eating plan. His blood pressure at home is normally between 120-130 over 80-90.  Assessment/Plan:   1. Pre-diabetes Clayton [name] will continue to work on weight loss, diet, exercise, and decreasing simple carbohydrates to help decrease the risk of diabetes. We will refill metformin for 1 month.  - metFORMIN (GLUCOPHAGE) 500 MG tablet; TAKE 1 TABLET BY MOUTH EVERY DAY WITH BREAKFAST  Dispense: 30 tablet; Refill: 0  2. Essential hypertension Clayton [name] will continue his medications, and will continue working on healthy weight loss, diet, and exercise to improve blood pressure control. We will watch for signs of hypotension as he continues his lifestyle modifications. We will recheck his blood pressure in 3 weeks.  3. Class 1 obesity with serious comorbidity and body mass index  (BMI) of 32.0 to 32.9 in adult, unspecified obesity type Clayton [name] is currently in the action stage of change. As such, his goal is to continue with weight loss efforts. He has agreed to keeping a food journal and adhering to recommended goals of 1800 calories and 120 grams of protein daily/total carbohydrates below 50 and no less than 1500 calories.   Exercise goals: As is previously.  Behavioral modification strategies: increasing lean protein intake and decreasing simple carbohydrates.  Clayton [name] has agreed to follow-up with our clinic in 3 weeks. He was informed of the importance of frequent follow-up visits to maximize his success with intensive lifestyle modifications for his multiple health conditions.   Objective:   Blood pressure (!) 161/89, pulse 60, temperature 98 F (36.7 C), temperature source Oral, height 6\' 3"  (1.905 m), weight 258 lb (117 kg), SpO2 98 %. Body mass index is 32.25 kg/m.  General: Cooperative, alert, well developed, in no acute distress. HEENT: Conjunctivae and lids unremarkable. Cardiovascular: Regular rhythm.  Lungs: Normal work of breathing. Neurologic: No focal deficits.   Lab Results  Component Value Date   CREATININE 1.28 (H) 06/25/2019   BUN 30 (H) 06/25/2019   NA 139 06/25/2019   K 5.0 06/25/2019   CL 103 06/25/2019   CO2 24 06/25/2019   Lab Results  Component Value Date   ALT 20 06/25/2019   AST 20 06/25/2019   ALKPHOS 62 06/25/2019   BILITOT 0.8 06/25/2019   Lab Results  Component Value Date   HGBA1C 5.8 (H) 08/01/2019  HGBA1C 5.8 (H) 09/04/2018   HGBA1C 6.0 (H) 11/01/2017   HGBA1C 5.4 04/14/2016   HGBA1C 6.0 06/09/2015   Lab Results  Component Value Date   INSULIN 3.9 08/01/2019   INSULIN 9.0 09/04/2018   INSULIN 22.4 11/01/2017   Lab Results  Component Value Date   TSH 1.290 08/09/2018   Lab Results  Component Value Date   CHOL 124 06/25/2019   HDL 56 06/25/2019   LDLCALC 55 06/25/2019   TRIG 61 06/25/2019   CHOLHDL  2.2 06/25/2019   Lab Results  Component Value Date   WBC 4.8 03/05/2019   HGB 15.6 03/13/2019   HCT 46.0 03/13/2019   MCV 90 03/05/2019   PLT 168 03/05/2019   No results found for: IRON, TIBC, FERRITIN  Obesity Behavioral Intervention Documentation for Insurance:   Approximately 15 minutes were spent on the discussion below.  ASK: We discussed the diagnosis of obesity with Clayton [name] today and Clayton [name] agreed to give [name] permission to discuss obesity behavioral modification therapy today.  ASSESS: Clayton [name] has the diagnosis of obesity and his BMI today is 32.25. Clayton [name] is in the action stage of change.   ADVISE: Clayton [name] was educated on the multiple health risks of obesity as well as the benefit of weight loss to improve his health. He was advised of the need for long term treatment and the importance of lifestyle modifications to improve his current health and to decrease his risk of future health problems.  AGREE: Multiple dietary modification options and treatment options were discussed and Clayton [name] agreed to follow the recommendations documented in the above note.  ARRANGE: Clayton [name] was educated on the importance of frequent visits to treat obesity as outlined per CMS and USPSTF guidelines and agreed to schedule his next follow up appointment today.  Attestation Statements:   Reviewed by clinician on day of visit: allergies, medications, problem list, medical history, surgical history, family history, social history, and previous encounter notes.   I, Trixie Dredge, am acting as transcriptionist for Dennard Nip, MD.  I have reviewed the above documentation for accuracy and completeness, and I agree with the above. -  Dennard Nip, MD

## 2019-09-25 ENCOUNTER — Encounter: Payer: Self-pay | Admitting: Family Medicine

## 2019-09-25 ENCOUNTER — Other Ambulatory Visit: Payer: Self-pay

## 2019-09-25 ENCOUNTER — Ambulatory Visit: Payer: Medicare PPO | Admitting: Family Medicine

## 2019-09-25 VITALS — BP 132/84 | HR 63 | Ht 75.0 in | Wt 253.0 lb

## 2019-09-25 DIAGNOSIS — M999 Biomechanical lesion, unspecified: Secondary | ICD-10-CM | POA: Diagnosis not present

## 2019-09-25 DIAGNOSIS — M545 Low back pain, unspecified: Secondary | ICD-10-CM

## 2019-09-25 DIAGNOSIS — G8929 Other chronic pain: Secondary | ICD-10-CM | POA: Diagnosis not present

## 2019-09-25 NOTE — Assessment & Plan Note (Signed)
Doing very well with conservative therapy at this time.  Not having to take much medications and continues to be able to play golf on a regular basis.  Continue to work out but continue with the low impact exercises.  Responding well to manipulation follow-up again in 6 weeks

## 2019-09-25 NOTE — Progress Notes (Signed)
[facility] Phone: [phone] Subjective:   [name], am serving as a scribe for Dr. Hulan Saas. This visit occurred during the SARS-CoV-2 public health emergency.  Safety protocols were in place, including screening questions prior to the visit, additional usage of staff PPE, and extensive cleaning of exam room while observing appropriate contact time as indicated for disinfecting solutions.   I'm seeing this patient by the request  of:  Kuneff, Renee A, DO  CC: Low back pain follow-up  [id number]  KRAIG GENIS is a 74 y.o. male coming in with complaint of back and neck pain. Left knee injection 09/11/2019. OMT 08/13/2019. Patient states very well at this time.  Some mild tightness.  Feels like it is actually improving since patient has had some viscosupplementation in the left knee that is feeling much better and states that the strength in his leg is better as well.  Patient has been resting comfortably.  Does notice with repetitive rotation of the back such as hitting golf balls does have some mild increase in discomfort.  Medications patient has been prescribed: Kenalog cream         Reviewed prior external information including notes and imaging from previsou exam, outside providers and external EMR if available.   As well as notes that were available from care everywhere and other healthcare systems.  Past medical history, social, surgical and family history all reviewed in electronic medical record.  No pertanent information unless stated regarding to the chief complaint.   Past Medical History:  Diagnosis Date  . Arthritis    hips   . Atrial fibrillation ([place])   . Cataract   . CPAP (continuous positive airway pressure) dependence   . Dysrhythmia    PAF- hx of 7 years ago   . HLD (hyperlipidemia)   . Hypertension 05/29/10   ECHO-EF 67% wnl  . Meniere disease   . Obesity   .  Palpitations   . Sleep apnea 09/14/04   [facility]- Dr. Humphrey Rolls; CPAP titration 11/12/04- Dr. Humphrey Rolls  . Stroke [facility])     Allergies  Allergen Reactions  . Rosuvastatin     Other reaction(s): GI Upset (intolerance) Crestor - Intestinal issues   . Statins Other (See Comments)    Crestor - Intestinal issues      Review of Systems:  No headache, visual changes, nausea, vomiting, diarrhea, constipation, dizziness, abdominal pain, skin rash, fevers, chills, night sweats, weight loss, swollen lymph nodes, body aches, joint swelling, chest pain, shortness of breath, mood changes. POSITIVE muscle aches  Objective  Blood pressure 132/84, pulse 63, height 6\' 3"  (1.905 m), weight 253 lb (114.8 kg), SpO2 97 %.   General: No apparent distress alert and oriented x3 mood and affect normal, dressed appropriately.  HEENT: Pupils equal, extraocular movements intact  Respiratory: Patient's speak in full sentences and does not appear short of breath  Cardiovascular: No lower extremity edema, non tender, no erythema  Neuro: Cranial nerves II through XII are intact, neurovascularly intact in all extremities with 2+ DTRs and 2+ pulses.  Gait normal with good balance and coordination.  MSK: Mild arthritic changes of multiple joints Back -low back does have some mild loss of lordosis, tightness noted in the paraspinal musculature of the lumbar spine right greater than left.  Patient does have tightness with Loura Halt.  Osteopathic findings   T8 extended rotated and side bent left L2 flexed  rotated and side bent right Sacrum right on right       Assessment and Plan:    Nonallopathic problems  Decision today to treat with OMT was based on Physical Exam  After verbal consent patient was treated with HVLA, ME, FPR techniques in thoracic, lumbar, and sacral  areas  Patient tolerated the procedure well with improvement in symptoms  Patient given exercises, stretches and lifestyle  modifications  See medications in patient instructions if given  Patient will follow up in 4-8 weeks      The above documentation has been reviewed and is accurate and complete [name], DO       Note: This dictation was prepared with Dragon dictation along with smaller phrase technology. Any transcriptional errors that result from this process are unintentional.

## 2019-09-25 NOTE — Patient Instructions (Signed)
See me again in 6-7 weeks ?

## 2019-10-03 ENCOUNTER — Ambulatory Visit (INDEPENDENT_AMBULATORY_CARE_PROVIDER_SITE_OTHER): Payer: Medicare PPO | Admitting: Family Medicine

## 2019-10-03 ENCOUNTER — Other Ambulatory Visit (INDEPENDENT_AMBULATORY_CARE_PROVIDER_SITE_OTHER): Payer: Self-pay | Admitting: Family Medicine

## 2019-10-03 ENCOUNTER — Encounter (INDEPENDENT_AMBULATORY_CARE_PROVIDER_SITE_OTHER): Payer: Self-pay | Admitting: Family Medicine

## 2019-10-03 ENCOUNTER — Other Ambulatory Visit: Payer: Self-pay

## 2019-10-03 VITALS — BP 161/82 | HR 68 | Temp 98.4°F | Ht 75.0 in | Wt 247.0 lb

## 2019-10-03 DIAGNOSIS — E669 Obesity, unspecified: Secondary | ICD-10-CM

## 2019-10-03 DIAGNOSIS — R7303 Prediabetes: Secondary | ICD-10-CM | POA: Diagnosis not present

## 2019-10-03 DIAGNOSIS — Z6830 Body mass index (BMI) 30.0-30.9, adult: Secondary | ICD-10-CM | POA: Diagnosis not present

## 2019-10-03 DIAGNOSIS — I10 Essential (primary) hypertension: Secondary | ICD-10-CM | POA: Diagnosis not present

## 2019-10-03 MED ORDER — METFORMIN HCL 500 MG PO TABS: ORAL_TABLET | ORAL | 0 refills | Status: DC

## 2019-10-03 NOTE — Progress Notes (Signed)
Chief Complaint:   OBESITY Clayton [name] is here to discuss his progress with his obesity treatment plan along with follow-up of his obesity related diagnoses. Clayton [name] is on keeping a food journal and adhering to recommended goals of 1800 calories and 120 grams of protein daily and states he is following his eating plan approximately 100% of the time. Clayton [name] states he is walking, stationary bike, for 45 minutes 7 times per week.  Today's visit was #: 23 Starting weight: 266 lbs Starting date: 11/01/2017 Today's weight: 247 lbs Today's date: 10/03/2019 Total lbs lost to date: 19 Total lbs lost since last in-office visit: 11  Interim History: Clayton [name] continues to do well with weight loss on his journaling and lower carbohydrate plan. He has increased water and is meeting his protein and exercise goals.  Subjective:   1. Essential hypertension Clayton [name]'s blood pressure fluctuates depending on when he takes his medications. He is followed by Dr. Claiborne Billings and he is stable otherwise. He is doing well with diet, exercise, and weight loss. He denies chest pain or headache.  2. Pre-diabetes Clayton [name] is stable on metformin, and he denies nausea or vomiting.  Assessment/Plan:   1. Essential hypertension Clayton [name] will continue with his medications, healthy weight loss, diet, and exercise to improve blood pressure control. We will watch for signs of hypotension as he continues his lifestyle modifications.  2. Pre-diabetes Clayton [name] will continue to work on weight loss, exercise, and decreasing simple carbohydrates to help decrease the risk of diabetes. We will refill metformin for 1 month.  - metFORMIN (GLUCOPHAGE) 500 MG tablet; TAKE 1 TABLET BY MOUTH EVERY DAY WITH BREAKFAST  Dispense: 30 tablet; Refill: 0  3. Class 1 obesity with serious comorbidity and body mass index (BMI) of 30.0 to 30.9 in adult, unspecified obesity type Clayton [name] is currently in the action stage of change. As such, his goal is to  continue with weight loss efforts. He has agreed to keeping a food journal and adhering to recommended goals of 1800 calories and 120+ grams of protein daily.   Exercise goals: As is.  Behavioral modification strategies: increasing lean protein intake, increasing water intake, meal planning and cooking strategies and planning for success.  Clayton [name] has agreed to follow-up with our clinic in 3 to 4 weeks. He was informed of the importance of frequent follow-up visits to maximize his success with intensive lifestyle modifications for his multiple health conditions.   Objective:   Blood pressure (!) 161/82, pulse 68, temperature 98.4 F (36.9 C), height 6\' 3"  (1.905 m), weight 247 lb (112 kg), SpO2 97 %. Body mass index is 30.87 kg/m.  General: Cooperative, alert, well developed, in no acute distress. HEENT: Conjunctivae and lids unremarkable. Cardiovascular: Regular rhythm.  Lungs: Normal work of breathing. Neurologic: No focal deficits.   Lab Results  Component Value Date   CREATININE 1.28 (H) 06/25/2019   BUN 30 (H) 06/25/2019   NA 139 06/25/2019   K 5.0 06/25/2019   CL 103 06/25/2019   CO2 24 06/25/2019   Lab Results  Component Value Date   ALT 20 06/25/2019   AST 20 06/25/2019   ALKPHOS 62 06/25/2019   BILITOT 0.8 06/25/2019   Lab Results  Component Value Date   HGBA1C 5.8 (H) 08/01/2019   HGBA1C 5.8 (H) 09/04/2018   HGBA1C 6.0 (H) 11/01/2017   HGBA1C 5.4 04/14/2016   HGBA1C 6.0 06/09/2015   Lab Results  Component Value Date   INSULIN 3.9 08/01/2019   INSULIN 9.0  09/04/2018   INSULIN 22.4 11/01/2017   Lab Results  Component Value Date   TSH 1.290 08/09/2018   Lab Results  Component Value Date   CHOL 124 06/25/2019   HDL 56 06/25/2019   LDLCALC 55 06/25/2019   TRIG 61 06/25/2019   CHOLHDL 2.2 06/25/2019   Lab Results  Component Value Date   WBC 4.8 03/05/2019   HGB 15.6 03/13/2019   HCT 46.0 03/13/2019   MCV 90 03/05/2019   PLT 168 03/05/2019   No  results found for: IRON, TIBC, FERRITIN  Obesity Behavioral Intervention:   Approximately 15 minutes were spent on the discussion below.  ASK: We discussed the diagnosis of obesity with Clayton [name] today and Clayton [name] agreed to give [name] permission to discuss obesity behavioral modification therapy today.  ASSESS: Clayton [name] has the diagnosis of obesity and his BMI today is 30.87. Clayton [name] is in the action stage of change.   ADVISE: Clayton [name] was educated on the multiple health risks of obesity as well as the benefit of weight loss to improve his health. He was advised of the need for long term treatment and the importance of lifestyle modifications to improve his current health and to decrease his risk of future health problems.  AGREE: Multiple dietary modification options and treatment options were discussed and Clayton [name] agreed to follow the recommendations documented in the above note.  ARRANGE: Clayton [name] was educated on the importance of frequent visits to treat obesity as outlined per CMS and USPSTF guidelines and agreed to schedule his next follow up appointment today.  Attestation Statements:   Reviewed by clinician on day of visit: allergies, medications, problem list, medical history, surgical history, family history, social history, and previous encounter notes.   I, Clayton [name], am acting as transcriptionist for Dennard Nip, MD.  I have reviewed the above documentation for accuracy and completeness, and I agree with the above. -  Dennard Nip, MD

## 2019-10-04 ENCOUNTER — Other Ambulatory Visit: Payer: Self-pay | Admitting: Cardiovascular Disease

## 2019-10-11 DIAGNOSIS — H905 Unspecified sensorineural hearing loss: Secondary | ICD-10-CM | POA: Diagnosis not present

## 2019-10-11 DIAGNOSIS — H903 Sensorineural hearing loss, bilateral: Secondary | ICD-10-CM | POA: Diagnosis not present

## 2019-10-11 DIAGNOSIS — H90A22 Sensorineural hearing loss, unilateral, left ear, with restricted hearing on the contralateral side: Secondary | ICD-10-CM | POA: Diagnosis not present

## 2019-10-11 DIAGNOSIS — H8102 Meniere's disease, left ear: Secondary | ICD-10-CM | POA: Diagnosis not present

## 2019-10-16 ENCOUNTER — Telehealth: Payer: Self-pay

## 2019-10-16 NOTE — Telephone Encounter (Signed)
Per Dr. Claiborne Billings, he recommends pt continue flecainide and follow up as scheduled.   Darreld Mclean, PA-C  07/13/2019 4:43 PM EDT     Hi Dr. Claiborne Billings. I'm covering Krista's box while she is out of town. Stress test was ordered for this patient due to dyspnea on exertion and fatigue. Read as low risk but with prior inferior infarct and mild peri-infarct ischemia. Do you recommended any further ischemic evaluation? He is also on Flecainide - should he continue this given possible CAD?  Thank you! Callie

## 2019-10-16 NOTE — Telephone Encounter (Signed)
Thank you! I sent the patient a MyChart message with the results.

## 2019-10-29 ENCOUNTER — Other Ambulatory Visit (INDEPENDENT_AMBULATORY_CARE_PROVIDER_SITE_OTHER): Payer: Self-pay | Admitting: Family Medicine

## 2019-10-29 DIAGNOSIS — R7303 Prediabetes: Secondary | ICD-10-CM

## 2019-10-31 ENCOUNTER — Encounter (INDEPENDENT_AMBULATORY_CARE_PROVIDER_SITE_OTHER): Payer: Self-pay | Admitting: Family Medicine

## 2019-10-31 ENCOUNTER — Other Ambulatory Visit: Payer: Self-pay

## 2019-10-31 ENCOUNTER — Ambulatory Visit (INDEPENDENT_AMBULATORY_CARE_PROVIDER_SITE_OTHER): Payer: Medicare PPO | Admitting: Family Medicine

## 2019-10-31 VITALS — BP 156/82 | HR 73 | Temp 97.8°F | Ht 75.0 in | Wt 248.0 lb

## 2019-10-31 DIAGNOSIS — I10 Essential (primary) hypertension: Secondary | ICD-10-CM | POA: Diagnosis not present

## 2019-10-31 DIAGNOSIS — E669 Obesity, unspecified: Secondary | ICD-10-CM

## 2019-10-31 DIAGNOSIS — Z6831 Body mass index (BMI) 31.0-31.9, adult: Secondary | ICD-10-CM

## 2019-11-02 ENCOUNTER — Telehealth: Payer: Self-pay | Admitting: Cardiovascular Disease

## 2019-11-02 NOTE — Telephone Encounter (Signed)
Spoke to patient he was calling to see if he needed any lab work done before his appointment with [facility] 10/19.After reviewing chart advised you do not need any lab.Last lab was done 06/25/19 and 08/01/19.

## 2019-11-02 NOTE — Telephone Encounter (Signed)
Patient is calling to see if he needs to get any blood tests done on Monday in regards to his appointment on Tuesday 11/06/2019.

## 2019-11-06 ENCOUNTER — Encounter: Payer: Self-pay | Admitting: Cardiovascular Disease

## 2019-11-06 ENCOUNTER — Ambulatory Visit: Payer: Medicare PPO | Admitting: Cardiovascular Disease

## 2019-11-06 ENCOUNTER — Other Ambulatory Visit: Payer: Self-pay

## 2019-11-06 VITALS — BP 158/80 | HR 64 | Ht 75.0 in | Wt 254.6 lb

## 2019-11-06 DIAGNOSIS — I480 Paroxysmal atrial fibrillation: Secondary | ICD-10-CM | POA: Diagnosis not present

## 2019-11-06 DIAGNOSIS — E782 Mixed hyperlipidemia: Secondary | ICD-10-CM

## 2019-11-06 DIAGNOSIS — G4733 Obstructive sleep apnea (adult) (pediatric): Secondary | ICD-10-CM | POA: Diagnosis not present

## 2019-11-06 DIAGNOSIS — Z7901 Long term (current) use of anticoagulants: Secondary | ICD-10-CM

## 2019-11-06 DIAGNOSIS — I4510 Unspecified right bundle-branch block: Secondary | ICD-10-CM | POA: Diagnosis not present

## 2019-11-06 DIAGNOSIS — I10 Essential (primary) hypertension: Secondary | ICD-10-CM

## 2019-11-06 DIAGNOSIS — Z9989 Dependence on other enabling machines and devices: Secondary | ICD-10-CM

## 2019-11-06 MED ORDER — AMLODIPINE BESYLATE 5 MG PO TABS: 7.5000 mg | ORAL_TABLET | Freq: Every day | ORAL | 3 refills | Status: DC

## 2019-11-06 NOTE — Progress Notes (Signed)
Patient ID: MEMPHIS DECOTEAU, male   DOB: [birth year], 74 y.o.   MRN: [id number]     HPI: Clayton [name] is a 74 y.o. male who presents for a 4 month follow-up cardiology evaluation.  Clayton [name] has a history of hypertension, a remote history of PAF, status post cardioversion in 2007, obstructive sleep apnea for which he uses CPAP 100% of the time, hypertension, and hyperlipidemia. He has had weight fluctuations over the years.  In the past, he also had a history of Mnire's disease.  There was some concern by [name] that perhaps this may have been related to statin use, which led to its discontinuance.  He is not had Mnire's disease in some time.  He underwent left hip replacement surgery by Dr. [name] and now has been able to continue to be active and exercises routinely.   His  blood pressure regimen has been amlodipine/benazepril 10/40 daily, hydrochlorothiazide has been taking 25 Mill grams in the evening and Toprol-XL 100 mg daily. He had atrial of livalo  For hyperlipidemia but developed some diarrhea secondary to this.    He has not had much success with weight loss over the past several years.    He continues to use his CPAP therapy with 100% compliance.  He will not even take a nap without his CPAP therapy and he takes his CPAP unit on alll his travels.   Since initiating CPAP therapy, he is unaware of any palpitations.  He denies any recurrent atrial fibrillation.  In the past he has had difficulty with family stress and his father had passed away, and his wife's mother recently died after having developed lung CA and had significant PVD.  He was not exercising as much as he had in the past.  He has not been successful in weight loss.  He continues to be active and still walks 3-4 miles per day.  Oftentimes in the morning when he awakens his blood pressure is elevated at 150/90 but after exercise it drops to 128/80.  He is unaware of any recurrent atrial fibrillation.  He continues  to use CPAP with 100% compliance.   He completed a two-year grieving process.  On 01/19/2016 he committed to weight loss.  On January 1, he weighed 278 pounds .  He has been walking 4 miles 5 days per week as well as some intermittent stationary bike.  He has lost a total of 32 pounds since January.  He continues to use CPAP with 100% compliance and since he has been on CPAP, he has not had any recurrent atrial fibrillation and his blood pressure has been less labile.  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  He has had failures to Crestor and Lipitor in the past due to significant myalgias, development, even with weekly dosing.  I resumed zetia   In August 2018 he noticed his heart rate increasing and he presented for evaluation and was seen by [name], PAC.  He admitted to a rare palpitation.  He denied any chest pain with exertion or dyspnea on exertion.  He has suffered a broken rib.  This year.  He has a weight goal of 220 pounds.    I  saw him in September 2018, at which time he was doing well from a cardiovascular standpoint.   His father was ill and ultimately passed away in [place].  As result he was going back and forth to the [facility] area.  This resulted  in a change in his diet and ability to exercise.  I last saw him in October 2019 and his weight had increased from 240 back up to 270 pounds.  He was unaware of any episodes of atrial fibrillation.  He was using CPAP with 100% compliance along with his naps.  He was committed to begin weight loss again.    Since I saw him on October 19, 2018 he has had major lifestyle change.  He has been working with Dr. Leafy Ro in the healthy weight and wellness group.  Peak weight was 280 and most recent weight 244.  I saw him for a cardiology evaluation on August 09, 2018.  O ver the past 4 months, he had been under increased stress as result of his wife's illness.  He has been checking his blood pressure regularly and this has been  stable but on his blood pressure recording he has noticed the message of heart rate irregularity.  He is unaware of A. fib but has noticed this message for the last 3 to 4 months.  He is exercising every day and typically walks 2 miles in the morning and in the afternoon or evening does 30 minutes of stationary bike at least 5 to 6 days/week.  He continues to use CPAP.  I obtained a download in the office today from June 22 through August 08, 2018.  He is 100% compliant.  He has a ResMed air sense 10 auto unit with a pressure range of 8-20 with 95% pressure at 10.1 and maximum average and 11.5.  AHI is excellent at 1.2.     During his August 09, 2018 encounter his ECG demonstrated that he was back in atrial fibrillation and had a ventricular rate at 89 bpm.  There was LVH with repolarization changes.  At that time, I recommended discontinuance of amlodipine/benazepril combination and in its place started Cardizem CD 180 mg rather than amlodipine and olmesartan 20 mg rather than benazepril.  I initiated anticoagulation with Eliquis 5 mg twice a day.  He underwent a 2D echo Doppler study on August 16, 2018 which showed EF low normal at 50 to 55%.  There was evidence for significant LVH and moderate dilation of his left atrium.  I saw him for follow-up evaluation on August 23, 2018.  At that time he was now cognizant of his heart rate irregularity.  He has continued to use CPAP with excellent compliance.  A new download was obtained from July 5 through August 21, 2018 which shows 100% compliance with average usage 7 hours and 28 minutes.  AHI is 1.6 and his 95th percentile auto pressure is 10.1 with a maximum average pressure of 11.4.  He states he has lost weight over the past several weeks purposefully.  He continues to exercise now on a stationary bike.  He denies any chest pain PND orthopnea.  He was tolerating Eliquis without bleeding.  During that evaluation, we discussed different options including several  additional weeks of increased weight control versus initiating antiarrhythmic therapy.  Since his only other episode of atrial fibrillation previously occurred 13 years ago and he had not had any recurrence until this year he opted for an increased rate control trial.  As result metoprolol dose was further titrated.  I saw on September 15, 2018 for follow-up evaluation.  At that time remained in atrial fibrillation despite his increase metoprolol succinate to 100 mg in the morning and 50 mg at night with continuation of diltiazem 180  mg, spironolactone 12.5 mg daily in addition to his olmesartan 20 mg.  I scheduled him for DC cardioversion but because of the need to obtain a COVID test I was unable to schedule this to be done by me prior to going on vacation.  He ultimately underwent the successful cardioversion by Dr. Davina Poke successfully and received 1 shock of 200 J of biphasic synchronized rhythm with restoration of sinus rhythm.  Chemistry profile done prior to the cardioversion revealed his creatinine had increased to 1.69 and as result I recommended he reduce his olmesartan down to 10 mg from his dose of 20 mg.  I saw him on October 10, 2018 in follow-up of his cardioversion.  At that time he was maintaining sinus rhythm and feeling well.  He had more energy.  His stress level had significantly reduced since his wife had successful L5-S1 back surgery by Dr. Vertell Limber.  He was continuing to use CPAP with 100% compliance.    I evaluated him on January 09, 2019.  At that time he stated that over the 3 months previous he had continued to feel well and remained asymptomatic.  At times there are still periods of increased stress.  He continued to use CPAP with 100% compliance.  A download was obtained from November 22 through January 08, 2019 which showed 100% compliance.  Average usage was 6 hours 47 minutes.  AHI is excellent at 1.4 with his AutoSet CPAP minimum pressure set at 8 and maximum of 20, with 95th  percentile pressure 10.2 with maximum average pressure 12.0.  During his evaluation, his ECG verify that he was back in atrial fibrillation.  At the time, he did not inform me that he had noted some irregularity but retrospectively this may have been going on for several weeks prior to that evaluation.  He was continuing to walk daily and exercising on his bike and was asymptomatic without shortness of breath.  During that evaluation I had a long discussion with him regarding possible EP evaluation for consideration of ablation.  After his significant discussion elected to initiate an attempt at antiarrhythmic therapy with low-dose flecainide initially at 50 mg twice a day with plans for office visit in 2 weeks.  He has continued to be on Eliquis for anticoagulation in addition to Zetia for his hyperlipidemia.  When I saw him on January 24, 2019 he stated that he had felt some fatigue once flecainide was instituted.  He was feeling well but this past Sunday he had been working very hard going up and down numerous steps carrying boxes with heavy exertion making at least 40 trips carrying Christmas girls back upstairs.  He was tired.  Sunday evening while sitting down he became disoriented anxious and it appeared that there was possibly some transient stress of aphasia.  He denied any focal weakness.  His symptoms ultimately resolved after 30 minutes.  He had called the office during the workweek and was concerned about his symptoms possibly secondary to flecainide.  He had self reduced his flecainide dose to just once a day rather than twice a day and is worked into my schedule today for follow-up evaluation.  Presently, he has no residual issues since that event several days ago.    At his January 2021 evaluation, I recommended he undergo a neurologic evaluation with Dr. Leonie Man as well as discussed an EP evaluation for possible consideration of future atrial fibrillation ablation.  He saw Dr. Curt Bears on February 08, 2019 and was  in persistent atrial fibrillation.  Was discussion concerning continuing flecainide with subsequent high voltage cardioversion.  He saw Dr. Leonie Man February 27, 2019 and after a long evaluation it was felt that his transient episode of word finding difficulty and anomia likely represented mild cognitive impairment rather than TIA or seizure.  He subsequently saw Dr Curt Bears back on March 05, 2019 and on 323 he underwent successful cardioversion with Dr. Oval Linsey which required 3 shocks with restoration of sinus rhythm.  I last saw him in March 2021 at which time he felt his heart rhythm had remained stable.    He was under considerable increased stress to his wife had fallen in sustained a fracture to her humerus as well as tear to her rotator cuff and biceps tendon.  Ul diltiazem was discontinued and he was started on amlodipine 5 mg and was told to continue olmesartan 20 mg in addition to his metoprolol and spironolactone.  Timately she underwent surgery.  He denies chest pain PND orthopnea.  He has continued to use CPAP download from March 05, 2019 through April 03, 2019 continues to show excellent compliance with an AHI of 2.7 and 95th percentile pressure 11.4 cm.   Since I last saw him, he was evaluated by Roby Lofts June 20, 2019.  At that time, he was getting to stroke with his CPAP but seen for the first 10 minutes before he was able to settle down and fall asleep.  He denies chest pain or shortness of breath.  He had noted some elevated blood pressures as well as some dyspnea on exertion.  During that evaluation his diltiazem was discontinued and he was started on amlodipine 5 mg.  He was told to continue his present dose of olmesartan, metoprolol, spironolactone.  During that evaluation he was maintaining sinus rhythm but had bradycardia and first-degree heart block.  Flecainide was reduced down to 50 mg twice a day since his symptoms of fatigability and dyspnea seem to occur at  the higher dose.  With return to a lower dose his symptoms resolved.  I last evaluated him in June 2021 over the prior 3 months he denied any recurrent episodes of chest pain.    He continues to have issues in the first 10 minutes of his CPAP use which I suspect may be related to not having a ramp time on his machine which can easily be adjusted.  His blood pressure does continue to be somewhat labile but he states at home most of the time it is controlled.  He was unaware of any recurrent episodes of atrial fibrillation.  He denied presyncope or syncope.    He underwent a nuclear stress test which apparently had been ordered for dyspnea on exertion.  This was low risk study and demonstrated a hypertensive response to exercise.  A defect was felt to be present in the inferior wall which was interpreted as scar with mild peri-infarct ischemia, which may also have been exacerbated by his body habitus and diaphragmatic attenuation.  Currently he remains asymptomatic and denies chest pain or shortness of breath.  He has been evaluated by Dr. Hermina Barters for left cochlear Mnire's disease.  He underwent audiogram which showed mild to moderate severity high-frequency sensorineural neural hearing loss in the right ear and mild to moderately severe downsloping sensorineural neural hearing loss in the left ear.   Presently he denies any recurrent episodes of atrial fibrillation.  He has continued to be on amlodipine 5 mg, metoprolol XL 100  mg in the evening with 25 or 50 mg in the morning, olmesartan 40 mg, and spironolactone 12.5 mg daily.  He continues to be on Eliquis for anticoagulation.  He is tolerating Livalo 2 mg and Zetia for hyperlipidemia with LDL cholesterol at 55 in June 2021.  He tries to play golf several times a week if possible and continues to score well.  He continues to be followed by Dr. Dennard Nip with plans for continued weight loss.   Past Medical History:  Diagnosis Date  . Arthritis     hips   . Atrial fibrillation ([place])   . Cataract   . CPAP (continuous positive airway pressure) dependence   . Dysrhythmia    PAF- hx of 7 years ago   . HLD (hyperlipidemia)   . Hypertension 05/29/10   ECHO-EF 67% wnl  . Meniere disease   . Obesity   . Palpitations   . Sleep apnea 09/14/04   [facility]- Dr. Humphrey Rolls; CPAP titration 11/12/04- Dr. Humphrey Rolls  . Stroke [facility])     Past Surgical History:  Procedure Laterality Date  . CARDIAC CATHETERIZATION  11/03/05  . CARDIOVERSION N/A 09/26/2018   Procedure: CARDIOVERSION;  Surgeon: Geralynn Rile, MD;  Location: [facility];  Service: Endoscopy;  Laterality: N/A;  . CARDIOVERSION N/A 03/13/2019   Procedure: CARDIOVERSION;  Surgeon: Skeet Latch, MD;  Location: [facility];  Service: Cardiovascular;  Laterality: N/A;  . CATARACT EXTRACTION  2014  . KNEE ARTHROSCOPY     left knee- 2002   . TOTAL HIP ARTHROPLASTY  06/08/2011   Procedure: TOTAL HIP ARTHROPLASTY ANTERIOR APPROACH;  Surgeon: [name], MD;  Location: [facility];  Service: Orthopedics;  Laterality: Left;    Allergies  Allergen Reactions  . Rosuvastatin     Other reaction(s): GI Upset (intolerance) Crestor - Intestinal issues   . Statins Other (See Comments)    Crestor - Intestinal issues     Current Outpatient Medications  Medication Sig Dispense Refill  . amLODipine (NORVASC) 5 MG tablet Take 1.5 tablets (7.5 mg total) by mouth daily. 90 tablet 3  . Ascorbic Acid (VITAMIN C) 1000 MG tablet Take 1,000 mg by mouth daily.     Marland Kitchen aspirin EC 81 MG tablet Take 1 tablet (81 mg total) by mouth daily. 90 tablet 3  . [place]-Glucosamine-Vit D (GLUCOSAMINE COMPLEX PO) Take 3 tablets by mouth daily.     . Coenzyme Q10 200 MG capsule Take 200 mg by mouth daily.     Marland Kitchen ELIQUIS 5 MG TABS tablet TAKE 1 TABLET BY MOUTH TWICE A DAY 180 tablet 2  . ezetimibe (ZETIA) 10 MG tablet TAKE 1 TABLET BY MOUTH EVERY DAY 90 tablet 3  . flecainide (TAMBOCOR) 100 MG  tablet Take 0.5 tablets (50 mg total) by mouth 2 (two) times daily. 30 tablet 9  . LIVALO 2 MG TABS TAKE 1 TABLET BY MOUTH EVERY DAY 90 tablet 3  . metFORMIN (GLUCOPHAGE) 500 MG tablet TAKE 1 TABLET BY MOUTH EVERY DAY WITH BREAKFAST 30 tablet 0  . metoprolol succinate (TOPROL-XL) 100 MG 24 hr tablet TAKE 100 MG IN THE EVENINGS (ALONG WITH 25 MG OR 50 MG IN THE MORNINGS) 90 tablet 3  . Multiple Vitamin (MULITIVITAMIN WITH MINERALS) TABS Take 1 tablet by mouth daily.     Marland Kitchen olmesartan (BENICAR) 40 MG tablet Take 1 tablet (40 mg total) by mouth daily. 90 tablet 3  . Omega 3-6-9 Fatty Acids (OMEGA-3-6-9 PO) Take 3 capsules  by mouth every morning. Omega 3= 856m,omega 6=3866momega 9 =30059m  . spironolactone (ALDACTONE) 25 MG tablet TAKE 1/2 TABLET BY MOUTH EVERY DAY 45 tablet 3  . tamsulosin (FLOMAX) 0.4 MG CAPS capsule Take 0.4 mg by mouth daily.    . tMarland Kitcheniamcinolone cream (KENALOG) 0.5 % APPLY 1 APPLICATION TOPICALLY 2 (TWO) TIMES DAILY. TO AFFECTED AREAS. 30 g 3  . zinc gluconate 50 MG tablet Take 50 mg by mouth daily.      No current facility-administered medications for this visit.    Socially he is remarried after his first wife passed away. He has 2 children. 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 is no tobacco history.  He plays golf at least 2 days/week and this past year not only did he have a hole in 1 but he has also shot his age.  ROS General: Negative; No fevers, chills, or night sweats;  HEENT: Negative; No changes in vision or hearing, sinus congestion, difficulty swallowing Pulmonary: Negative; No cough, wheezing, shortness of breath, hemoptysis Cardiovascular: See HPI GI: Negative; No nausea, vomiting, diarrhea, or abdominal pain GU: Negative; No dysuria, hematuria, or difficulty voiding Musculoskeletal: He is status post rib fracture.  He is status post hip replacement. Hematologic/Oncology: Negative; no easy bruising, bleeding Endocrine:  Negative; no heat/cold intolerance; no diabetes Neuro: No vertigo or imbalance issues Skin: Negative; No rashes or skin lesions Psychiatric: Negative; No behavioral problems, depression Sleep: Positive for obstructive sleep apnea on CPAP therapy with 100% compliance No snoring, daytime sleepiness, hypersomnolence, bruxism, restless legs, hypnogognic hallucinations, no cataplexy Other comprehensive 14 point system review is negative.  PE BP (!) 158/80   Pulse 64   Ht 6' 3"  (1.905 m)   Wt 254 lb 9.6 oz (115.5 kg)   SpO2 97%   BMI 31.82 kg/m    Repeat blood pressure by me was 148/80  Wt Readings from Last 3 Encounters:  11/06/19 254 lb 9.6 oz (115.5 kg)  10/31/19 248 lb (112.5 kg)  10/03/19 247 lb (112 kg)  He  has lost 40 pounds over the past year.  General: Alert, oriented, no distress.  Skin: normal turgor, no rashes, warm and dry HEENT: Normocephalic, atraumatic. Pupils equal round and reactive to light; sclera anicteric; extraocular muscles intact;  Nose without nasal septal hypertrophy Mouth/Parynx benign; Mallinpatti scale 3 Neck: No JVD, no carotid bruits; normal carotid upstroke Lungs: clear to ausculatation and percussion; no wheezing or rales Chest wall: without tenderness to palpitation Heart: PMI not displaced, RRR, s1 s2 normal, 1/6 systolic murmur, no diastolic murmur, no rubs, gallops, thrills, or heaves Abdomen: Central adiposity; soft, nontender; no hepatosplenomehaly, BS+; abdominal aorta nontender and not dilated by palpation. Back: no CVA tenderness Pulses 2+ Musculoskeletal: full range of motion, normal strength, no joint deformities Extremities: no clubbing cyanosis or edema, Homan's sign negative  Neurologic: grossly nonfocal; Cranial nerves grossly wnl Psychologic: Normal mood and affect  ECG (independently read by me): Sinus rhythm at 64; 1st degree AV block; PR 212 msec;RBBB  July 09, 2019 ECG (independently read by me): Normal sinus rhythm at 70  bpm, first-degree AV block with a.  PR interval at 212 ms.  Right bundle branch block with repolarization changes.  Borderline LVH by voltage criteria.  QTc interval 470 ms.  April 04, 2019 ECG (independently read by me): Sinus bradycardia 59 bpm, first-degree AV block with a PR of 248 ms.  LVH.  Inferior Q waves, unchanged  January 24, 2019 ECG (independently read by me): Atrial fibrillation at 83 bpm.  Borderline LVH by voltage criteria in aVL; QTc interval 446 ms.  January 09, 2019 ECG (independently read by me): Atrial fibrillation ar 97, RBBB; Left axis deviation; Inferior Q waves, unchanged. QTc 487    August 09, 2018 ECG (independently read by me): Normal sinus rhythm at 72 bpm.  Left Axis deviation.  QTc interval 429 ms.  September 15, 2018 ECG (independently read by me): Atrial fibrillation at 92 bpm.  Right bundle branch block with repolarization changes.  August 23, 2018 ECG (independently read by me): Atrial fibrillation at 96 bpm.  LVH with repolarization changes.  Left axis deviation.  Left anterior hemiblock.  Inferior Q waves  August 09, 2018 ECG (independently read by me):Atrial fibrillatoion at 65; LVH with repolarization  October 219 ECG (independently read by me): Normal sinus rhythm at 81 bpm.  Nonspecific intraventricular conduction delay.  Left axis deviation  September 2018 ECG (independently read by me): Normal sinus rhythm at 63 bpm.  Left bundle branch block with repolarization changes.  Normal intervals.  No ectopy.  April 2018 ECG (independently read by me): Normal sinus rhythm at 68 bpm.  LVH with repolarization.  Normal intervals.  November 2017 ECG (independently read by me): Normal sinus rhythm at 78 bpm.  Nonspecific interventricular delay.  QRS complex V1 V2.  August 2016 ECG (independently read by me): Normal sinus rhythm at 77 bpm.  LVH with repolarization changes.  Left axis deviation.  December 2015 ECG (independently read by me): Normal sinus rhythm at 87  bpm.  LVH by voltage criteria with T-wave changes in leads 1 and aVL.  July 2014 ECG: Normal sinus rhythm at 76 beats per minute. LVH with QRS widening.  LABS:  BMP Latest Ref Rng & Units 06/25/2019 03/13/2019 03/05/2019  Glucose 65 - 99 mg/dL 109(H) 121(H) 120(H)  BUN 8 - 27 mg/dL 30(H) 30(H) 22  Creatinine 0.76 - 1.27 mg/dL 1.28(H) 1.50(H) 1.35(H)  BUN/Creat Ratio 10 - 24 23 - 16  Sodium 134 - 144 mmol/L 139 137 138  Potassium 3.5 - 5.2 mmol/L 5.0 4.5 4.8  Chloride 96 - 106 mmol/L 103 105 102  CO2 20 - 29 mmol/L 24 - 23  Calcium 8.6 - 10.2 mg/dL 9.6 - 10.1   Hepatic Function Latest Ref Rng & Units 06/25/2019 01/09/2019 01/05/2019  Total Protein 6.0 - 8.5 g/dL 6.5 6.6 6.7  Albumin 3.7 - 4.7 g/dL 4.5 4.5 4.6  AST 0 - 40 IU/L 20 30 26   ALT 0 - 44 IU/L 20 31 31   Alk Phosphatase 48 - 121 IU/L 62 64 65  Total Bilirubin 0.0 - 1.2 mg/dL 0.8 0.8 0.7   CBC Latest Ref Rng & Units 03/13/2019 03/05/2019 09/22/2018  WBC 3.4 - 10.8 x10E3/uL - 4.8 6.1  Hemoglobin 13.0 - 17.0 g/dL 15.6 15.4 15.4  Hematocrit 39 - 52 % 46.0 45.3 46.2  Platelets 150 - 450 x10E3/uL - 168 187   Lab Results  Component Value Date   MCV 90 03/05/2019   MCV 95 09/22/2018   MCV 92 08/09/2018   Lab Results  Component Value Date   TSH 1.290 08/09/2018    Lab Results  Component Value Date   HGBA1C 5.8 (H) 08/01/2019    Lipid Panel     Component Value Date/Time   CHOL 124 06/25/2019 1014   TRIG 61 06/25/2019 1014   HDL 56 06/25/2019 1014   CHOLHDL 2.2 06/25/2019 1014  CHOLHDL 3.8 10/12/2016 0902   VLDL 17 04/14/2016 0937   LDLCALC 55 06/25/2019 1014   LDLCALC 119 (H) 10/12/2016 0902     RADIOLOGY: No results found.  IMPRESSION:  1. Essential hypertension   2. Paroxysmal atrial fibrillation (HCC)   3. Anticoagulated   4. Mixed hyperlipidemia   5. RBBB   6. OSA on CPAP        ASSESSMENT AND PLAN: Mr Rosten is a 74 year old gentleman who has a long-standing history of hypertension and remotely had  been on amlodipine/benazepril 10/40, HCTZ 25 mg, Toprol-XL 100 mg in addition to spironolactone 12.5 mg daily.  He had had a prior isolated episode of atrial fibrillation in 2007 and had undergone cardioversion.  On this regimen his blood pressure had stabilized and consistently was well controlled and he had not had any recurrent episodes of atrial fibrillation.   He had gained significant amount of weight since August 2019.  He initiated a major lifestyle change and has been working closely with Dr. Leafy Ro from [facility] wellness group resulting in a greater than 40 pound weight loss over the past year.  When I saw him in late July 2020 he was in atrial fibrillation of questionable duration.  At the time retrospectively he believes he may have been in A. fib for 3 to 4 months duration.  I gradually titrated his medical regimen and changed his benazepril to diltiazem and olmesartan to take in addition to metoprolol succinate and his spironolactone 12.5 mg daily.  He had continued to use CPAP with 100% compliance.  His only previous episode of A. fib prior to this episode had occurred in 2007 and he had maintained sinus rhythm until this past year.  He underwent successful cardioversion on September 26, 2018 with restoration of sinus rhythm.  When I saw him on January 09, 2019 he was continuing to have periods of increased stress ECG again showed recurrent atrial fibrillation of questionable duration.  Retrospectively he felt he may have been in atrial fibrillation for 3 to 4 weeks duration prior to that visit.  He also had an episode of disorientation with possible transient expressive aphasia and underwent neurologic evaluation with Dr. Leonie Man and also EP evaluation with Dr. Curt Bears.  He was felt that his word finding difficulty and anomia most likely represented mild cognitive impairment rather than a TIA or stroke.  He had been on increased dose of flecainide up to 100 mg twice a day after his  subsequent cardioversion.  Most recently he is now back down to 50 mg twice a day and feels that his prior symptoms of shortness of breath and fatigability are markedly improved.  An echo Doppler study in January 2021 showed an EF of 50 to 55% without regional wall motion abnormalities.  He apparently was referred for a Myoview study in June which suggested an EF of 47%.  It was interpreted as a low risk study and there was findings consistent with possible prior MI/mild peri-infarction ischemia.  Presently he is asymptomatic without chest pain or shortness of breath.  Some of the abnormality potentially can be related to his body habitus with attenuation artifact.  His blood pressure today continues to be mildly elevated and I am suggesting further titration of amlodipine to 7.5 mg.  He will continue metoprolol succinate 100 mg at bedtime in addition to his olmesartan 40 mg and spironolactone 25 mg.  He is on CPAP therapy and believes he is sleeping well and  often takes a daily nap.  He is maintaining sinus rhythm on ECG and has first-degree AV block with right bundle branch block.  He continues to be followed by Dr. Leafy Ro and is on a low-carb diet with plans for an additional 25 pound weight loss.  I will see him in 4 months for reevaluation.  [name], MD, [facility]  11/08/2019 5:05 PM

## 2019-11-06 NOTE — Patient Instructions (Signed)
Medication Instructions:  Increase your amlodipine to 7.5 mg (1.5 tablets) once a day *If you need a refill on your cardiac medications before your next appointment, please call your pharmacy*   Lab Work: None ordered If you have labs (blood work) drawn today and your tests are completely normal, you will receive your results only by: Marland Kitchen MyChart Message (if you have MyChart) OR . A paper copy in the mail If you have any lab test that is abnormal or we need to change your treatment, we will call you to review the results.   Testing/Procedures: None ordered   Follow-Up: At [facility], you and your health needs are our priority.  As part of our continuing mission to provide you with exceptional heart care, we have created designated Provider Care Teams.  These Care Teams include your primary Cardiologist (physician) and Advanced Practice Providers (APPs -  Physician Assistants and Nurse Practitioners) who all work together to provide you with the care you need, when you need it.  We recommend signing up for the patient portal called "MyChart".  Sign up information is provided on this After Visit Summary.  MyChart is used to connect with patients for Virtual Visits (Telemedicine).  Patients are able to view lab/test results, encounter notes, upcoming appointments, etc.  Non-urgent messages can be sent to your provider as well.   To learn more about what you can do with MyChart, go to [email or web address].    Your next appointment:   4 month(s)  The format for your next appointment:   In Person  Provider:   Shelva Majestic, MD   Other Instructions None

## 2019-11-08 ENCOUNTER — Encounter: Payer: Self-pay | Admitting: Cardiovascular Disease

## 2019-11-09 NOTE — Addendum Note (Signed)
Addended by: Wonda Horner on: 11/09/2019 04:09 PM   Modules accepted: Orders

## 2019-11-12 NOTE — Progress Notes (Signed)
[name] [address] [place] Phone: [phone] Subjective:   I Clayton [name] am serving as a Education administrator for Dr. Hulan Saas.  This visit occurred during the SARS-CoV-2 public health emergency.  Safety protocols were in place, including screening questions prior to the visit, additional usage of staff PPE, and extensive cleaning of exam room while observing appropriate contact time as indicated for disinfecting solutions.   I'm seeing this patient by the request  of:  Kuneff, Renee A, DO  CC: Back and neck pain follow-up  [id number]  Clayton [name] is a 74 y.o. male coming in with complaint of back and neck pain. OMT 09/25/2019. Patient states the left knee is doing well. Back is doing well. Right hip and foot pain today. Would like to know if he needs orthotics for his feet. Pain is all over the foot. Foot has been painful for a few months as well as the hip. States his foot is flat.            Reviewed prior external information including notes and imaging from previsou exam, outside providers and external EMR if available.   As well as notes that were available from care everywhere and other healthcare systems.  Past medical history, social, surgical and family history all reviewed in electronic medical record.  No pertanent information unless stated regarding to the chief complaint.   Past Medical History:  Diagnosis Date  . Arthritis    hips   . Atrial fibrillation ([place])   . Cataract   . CPAP (continuous positive airway pressure) dependence   . Dysrhythmia    PAF- hx of 7 years ago   . HLD (hyperlipidemia)   . Hypertension 05/29/10   ECHO-EF 67% wnl  . Meniere disease   . Obesity   . Palpitations   . Sleep apnea 09/14/04   [facility]- Dr. Humphrey Rolls; CPAP titration 11/12/04- Dr. Humphrey Rolls  . Stroke [facility])     Allergies  Allergen Reactions  . Rosuvastatin     Other reaction(s): GI Upset  (intolerance) Crestor - Intestinal issues   . Statins Other (See Comments)    Crestor - Intestinal issues      Review of Systems:  No headache, visual changes, nausea, vomiting, diarrhea, constipation, dizziness, abdominal pain, skin rash, fevers, chills, night sweats, weight loss, swollen lymph nodes, body aches, joint swelling, chest pain, shortness of breath, mood changes. POSITIVE muscle aches  Objective  Blood pressure 140/90, pulse 65, weight 258 lb (117 kg), SpO2 97 %.   General: No apparent distress alert and oriented x3 mood and affect normal, dressed appropriately.  HEENT: Pupils equal, extraocular movements intact  Respiratory: Patient's speak in full sentences and does not appear short of breath  Cardiovascular: No lower extremity edema, non tender, no erythema   Gait antalgic  Back - low back does have some mild loss of lordosis and tightness with FABER, neg SLT, TTP diffusely.  Significant limitation of internal rotation of less than 5 degrees on the right side.  Left side has been replaced.  Good strength of the lower extremity.  Foot exam shows pes planus bilaterally.  Patient has some breakdown of the transverse arch right greater than left.  Rigid midfoot   Osteopathic findings   T4 extended rotated and side bent left L2 flexed rotated and side bent right Sacrum right on right       Assessment and Plan: Low back pain Low back  chronic problem, known DDD. Responding well to weight loss , HEP and OMT.  No prescription med RTC in 6 weeks   Arthritis of right hip Patient has contralateral side replacement.  Continuing to have some discomfort and more in the groin area.  Limited range of motion of the hip.  X-rays pending.  Patient could be a very good candidate for the possibility of a anterior approach if necessary.  Patient will increase activity slowly.  Pes planus Pes planus.  Discussed with patient again in great length and over-the-counter orthotics.   Discussed proper shoes.  Patient does need more of the transverse arch with the support given some mild exercise.  Follow-up again in 6 weeks     Nonallopathic problems  Decision today to treat with OMT was based on Physical Exam  After verbal consent patient was treated with HVLA, ME, FPR techniques in  thoracic, lumbar, and sacral  areas  Patient tolerated the procedure well with improvement in symptoms  Patient given exercises, stretches and lifestyle modifications  See medications in patient instructions if given  Patient will follow up in 6 weeks      The above documentation has been reviewed and is accurate and complete [name], DO       Note: This dictation was prepared with Dragon dictation along with smaller phrase technology. Any transcriptional errors that result from this process are unintentional.

## 2019-11-12 NOTE — Progress Notes (Signed)
Chief Complaint:   OBESITY Clayton [name] is here to discuss his progress with his obesity treatment plan along with follow-up of his obesity related diagnoses. Clayton [name] is on keeping a food journal and adhering to recommended goals of 1800 calories and 120+ grams of protein daily and states he is following his eating plan approximately 80% of the time. Clayton [name] states he is walking for 75 minutes 7 times per week.  Today's visit was #: 24 Starting weight: 266 lbs Starting date: 11/01/2017 Today's weight: 248 lbs Today's date: 10/31/2019 Total lbs lost to date: 18 Total lbs lost since last in-office visit: 0  Interim History: Clayton [name] has had extra challenges with exercise with recent knee injury. He is doing well with his eating plan overall and he has significantly decreased simple carbohydrates in his diet. He anticipates doing well over Halloween.  Subjective:   1. Essential hypertension Clayton [name]'s blood pressure is elevated today, and he hasn't taken his morning medications yet. His blood pressure has been borderline elevated for his age on and off in the last few months. He has had episodes of dizziness and has a previous diagnosis of meniere's disease. He plans to discuss with Dr. Claiborne Billings about his medications.  Assessment/Plan:   1. Essential hypertension Rahn will continue healthy weight loss, diet, and exercise to improve blood pressure control. We will watch for signs of hypotension as he continues his lifestyle modifications. He is to inform us of any changes in his medications at his next visit.  2. Class 1 obesity with serious comorbidity and body mass index (BMI) of 31.0 to 31.9 in adult, unspecified obesity type Clayton [name] is currently in the action stage of change. As such, his goal is to continue with weight loss efforts. He has agreed to following a lower carbohydrate, vegetable and lean protein rich diet plan.   Exercise goals: As is.  Behavioral modification strategies: meal  planning and cooking strategies and planning for success.  Clayton [name] has agreed to follow-up with our clinic in 4 weeks. He was informed of the importance of frequent follow-up visits to maximize his success with intensive lifestyle modifications for his multiple health conditions.   Objective:   Blood pressure (!) 156/82, pulse 73, temperature 97.8 F (36.6 C), height 6\' 3"  (1.905 m), weight 248 lb (112.5 kg), SpO2 100 %. Body mass index is 31 kg/m.  General: Cooperative, alert, well developed, in no acute distress. HEENT: Conjunctivae and lids unremarkable. Cardiovascular: Regular rhythm.  Lungs: Normal work of breathing. Neurologic: No focal deficits.   Lab Results  Component Value Date   CREATININE 1.28 (H) 06/25/2019   BUN 30 (H) 06/25/2019   NA 139 06/25/2019   K 5.0 06/25/2019   CL 103 06/25/2019   CO2 24 06/25/2019   Lab Results  Component Value Date   ALT 20 06/25/2019   AST 20 06/25/2019   ALKPHOS 62 06/25/2019   BILITOT 0.8 06/25/2019   Lab Results  Component Value Date   HGBA1C 5.8 (H) 08/01/2019   HGBA1C 5.8 (H) 09/04/2018   HGBA1C 6.0 (H) 11/01/2017   HGBA1C 5.4 04/14/2016   HGBA1C 6.0 06/09/2015   Lab Results  Component Value Date   INSULIN 3.9 08/01/2019   INSULIN 9.0 09/04/2018   INSULIN 22.4 11/01/2017   Lab Results  Component Value Date   TSH 1.290 08/09/2018   Lab Results  Component Value Date   CHOL 124 06/25/2019   HDL 56 06/25/2019   LDLCALC 55 06/25/2019   TRIG  61 06/25/2019   CHOLHDL 2.2 06/25/2019   Lab Results  Component Value Date   WBC 4.8 03/05/2019   HGB 15.6 03/13/2019   HCT 46.0 03/13/2019   MCV 90 03/05/2019   PLT 168 03/05/2019   No results found for: IRON, TIBC, FERRITIN  Attestation Statements:   Reviewed by clinician on day of visit: allergies, medications, problem list, medical history, surgical history, family history, social history, and previous encounter notes.  Time spent on visit including pre-visit  chart review and post-visit care and charting was 33 minutes.    I, Trixie Dredge, am acting as transcriptionist for Dennard Nip, MD.  I have reviewed the above documentation for accuracy and completeness, and I agree with the above. -  Dennard Nip, MD

## 2019-11-13 ENCOUNTER — Ambulatory Visit: Payer: Medicare PPO | Admitting: Family Medicine

## 2019-11-13 ENCOUNTER — Other Ambulatory Visit: Payer: Self-pay

## 2019-11-13 ENCOUNTER — Encounter: Payer: Self-pay | Admitting: Family Medicine

## 2019-11-13 ENCOUNTER — Ambulatory Visit (INDEPENDENT_AMBULATORY_CARE_PROVIDER_SITE_OTHER): Payer: Medicare PPO

## 2019-11-13 VITALS — BP 140/90 | HR 65 | Wt 258.0 lb

## 2019-11-13 DIAGNOSIS — M2142 Flat foot [pes planus] (acquired), left foot: Secondary | ICD-10-CM

## 2019-11-13 DIAGNOSIS — G8929 Other chronic pain: Secondary | ICD-10-CM

## 2019-11-13 DIAGNOSIS — M1611 Unilateral primary osteoarthritis, right hip: Secondary | ICD-10-CM | POA: Insufficient documentation

## 2019-11-13 DIAGNOSIS — M545 Low back pain, unspecified: Secondary | ICD-10-CM

## 2019-11-13 DIAGNOSIS — M999 Biomechanical lesion, unspecified: Secondary | ICD-10-CM

## 2019-11-13 DIAGNOSIS — M25551 Pain in right hip: Secondary | ICD-10-CM

## 2019-11-13 DIAGNOSIS — M2140 Flat foot [pes planus] (acquired), unspecified foot: Secondary | ICD-10-CM | POA: Insufficient documentation

## 2019-11-13 DIAGNOSIS — M2141 Flat foot [pes planus] (acquired), right foot: Secondary | ICD-10-CM | POA: Diagnosis not present

## 2019-11-13 NOTE — Assessment & Plan Note (Signed)
Low back chronic problem, known DDD. Responding well to weight loss , HEP and OMT.  No prescription med RTC in 6 weeks

## 2019-11-13 NOTE — Patient Instructions (Addendum)
Hip xray today Spenco orthotics "total support" Exercise 3 times a week Send me info on pain free See me again in 6 weeks

## 2019-11-13 NOTE — Assessment & Plan Note (Signed)
Pes planus.  Discussed with patient again in great length and over-the-counter orthotics.  Discussed proper shoes.  Patient does need more of the transverse arch with the support given some mild exercise.  Follow-up again in 6 weeks

## 2019-11-13 NOTE — Assessment & Plan Note (Signed)
Patient has contralateral side replacement.  Continuing to have some discomfort and more in the groin area.  Limited range of motion of the hip.  X-rays pending.  Patient could be a very good candidate for the possibility of a anterior approach if necessary.  Patient will increase activity slowly.

## 2019-11-15 ENCOUNTER — Encounter: Payer: Self-pay | Admitting: Family Medicine

## 2019-11-22 ENCOUNTER — Encounter (INDEPENDENT_AMBULATORY_CARE_PROVIDER_SITE_OTHER): Payer: Self-pay

## 2019-11-22 ENCOUNTER — Other Ambulatory Visit (INDEPENDENT_AMBULATORY_CARE_PROVIDER_SITE_OTHER): Payer: Self-pay | Admitting: Family Medicine

## 2019-11-22 DIAGNOSIS — R7303 Prediabetes: Secondary | ICD-10-CM

## 2019-11-22 NOTE — Telephone Encounter (Signed)
MyChart message sent to pt to find out if they have enough medication to get them through until next appt.   

## 2019-11-22 NOTE — Telephone Encounter (Signed)
Dr Beasley pt 

## 2019-11-26 NOTE — Telephone Encounter (Signed)
PT indicated that he will be in need of a refill prior to upcoming visit. Refill form filled out for MD to approve/deny.

## 2019-11-29 ENCOUNTER — Ambulatory Visit (INDEPENDENT_AMBULATORY_CARE_PROVIDER_SITE_OTHER): Payer: Medicare PPO | Admitting: Family Medicine

## 2019-12-17 ENCOUNTER — Ambulatory Visit (INDEPENDENT_AMBULATORY_CARE_PROVIDER_SITE_OTHER): Payer: Medicare PPO | Admitting: Family Medicine

## 2019-12-20 ENCOUNTER — Other Ambulatory Visit (INDEPENDENT_AMBULATORY_CARE_PROVIDER_SITE_OTHER): Payer: Self-pay | Admitting: Family Medicine

## 2019-12-20 DIAGNOSIS — R7303 Prediabetes: Secondary | ICD-10-CM

## 2019-12-26 ENCOUNTER — Other Ambulatory Visit: Payer: Self-pay

## 2019-12-26 ENCOUNTER — Ambulatory Visit: Payer: Medicare PPO | Admitting: Family Medicine

## 2019-12-26 ENCOUNTER — Encounter: Payer: Self-pay | Admitting: Family Medicine

## 2019-12-26 ENCOUNTER — Ambulatory Visit (INDEPENDENT_AMBULATORY_CARE_PROVIDER_SITE_OTHER): Payer: Medicare PPO

## 2019-12-26 VITALS — BP 140/86 | HR 64 | Ht 75.0 in | Wt 254.0 lb

## 2019-12-26 DIAGNOSIS — M545 Low back pain, unspecified: Secondary | ICD-10-CM

## 2019-12-26 DIAGNOSIS — G8929 Other chronic pain: Secondary | ICD-10-CM

## 2019-12-26 DIAGNOSIS — M4316 Spondylolisthesis, lumbar region: Secondary | ICD-10-CM | POA: Diagnosis not present

## 2019-12-26 DIAGNOSIS — M999 Biomechanical lesion, unspecified: Secondary | ICD-10-CM

## 2019-12-26 MED ORDER — PREDNISONE 50 MG PO TABS: ORAL_TABLET | ORAL | 0 refills | Status: DC

## 2019-12-26 MED ORDER — GABAPENTIN 100 MG PO CAPS: 200.0000 mg | ORAL_CAPSULE | Freq: Every day | ORAL | 3 refills | Status: DC

## 2019-12-26 NOTE — Assessment & Plan Note (Signed)
Worsening at this time.  We will get x-rays and flexion-extension. History does show more likelihood to be more of a consistent stenosis at the L5-S1 area.  Start on gabapentin low-dose.  Warned potential side effects.  Patient also given prednisone to do short course.  Home exercises given and work with Product/process development scientist.  Will avoid repetitive extension.  Worsening pain will need to consider the possibility of MRI.  Could be a candidate for epidurals.

## 2019-12-26 NOTE — Patient Instructions (Addendum)
Good to see you Xray today Gabapentin 200 mg at night Prednisone daily for 5 days Exercises avoid excessive extension  See me again in 6 weeks

## 2019-12-26 NOTE — Assessment & Plan Note (Signed)

## 2019-12-26 NOTE — Progress Notes (Signed)
[place] [address] [place] Phone: [phone] Subjective:   I Clayton [name] am serving as a Education administrator for Dr. Hulan Saas.  This visit occurred during the SARS-CoV-2 public health emergency.  Safety protocols were in place, including screening questions prior to the visit, additional usage of staff PPE, and extensive cleaning of exam room while observing appropriate contact time as indicated for disinfecting solutions.   I'm seeing this patient by the request  of:  Kuneff, Renee A, DO  CC: back pain follow-up  [id number]  Clayton [name] is a 74 y.o. male coming in with complaint of back pain. Bilateral leg pain. States he has been in a lot of pain and hasn't been sleeping. Sitting and standing he is sore. Left knee is better but believes he has a lack of strength in the legs. Believes that it may be his back.   Onset- Chronic  Duration- steady ache in the legs   Patient did have x-rays at last exam of the right hip showing the patient does have moderate narrowing of the right hip.  This was independently visualized by me.    Past Medical History:  Diagnosis Date  . Arthritis    hips   . Atrial fibrillation ([place])   . Cataract   . CPAP (continuous positive airway pressure) dependence   . Dysrhythmia    PAF- hx of 7 years ago   . HLD (hyperlipidemia)   . Hypertension 05/29/10   ECHO-EF 67% wnl  . Meniere disease   . Obesity   . Palpitations   . Sleep apnea 09/14/04   [facility]- Dr. Humphrey Rolls; CPAP titration 11/12/04- Dr. Humphrey Rolls  . Stroke [facility])    Past Surgical History:  Procedure Laterality Date  . CARDIAC CATHETERIZATION  11/03/05  . CARDIOVERSION N/A 09/26/2018   Procedure: CARDIOVERSION;  Surgeon: Geralynn Rile, MD;  Location: [facility];  Service: Endoscopy;  Laterality: N/A;  . CARDIOVERSION N/A 03/13/2019   Procedure: CARDIOVERSION;  Surgeon: Skeet Latch, MD;  Location: [facility];   Service: Cardiovascular;  Laterality: N/A;  . CATARACT EXTRACTION  2014  . KNEE ARTHROSCOPY     left knee- 2002   . TOTAL HIP ARTHROPLASTY  06/08/2011   Procedure: TOTAL HIP ARTHROPLASTY ANTERIOR APPROACH;  Surgeon: [name], MD;  Location: [facility];  Service: Orthopedics;  Laterality: Left;   Social History   Socioeconomic History  . Marital status: Married    Spouse name: Dahmir Epperly  . Number of children: Not on file  . Years of education: Not on file  . Highest education level: Not on file  Occupational History  . Occupation: Retired  Tobacco Use  . Smoking status: Never Smoker  . Smokeless tobacco: Never Used  Vaping Use  . Vaping Use: Never used  Substance and Sexual Activity  . Alcohol use: No  . Drug use: No  . Sexual activity: Yes    Birth control/protection: None  Other Topics Concern  . Not on file  Social History Narrative   Married. [name].Takes care of his mother-in-law as well.    Retired, Conservator, museum/gallery.    Drinks caffeine beverages. Takes a daily vitamin.   Exercises routinely.    Smoke detector in the home.    Ambulates independently.    Social Determinants of Health   Financial Resource Strain:   . Difficulty of Paying Living Expenses: Not on file  Food Insecurity:   . Worried  About Running Out of Food in the Last Year: Not on file  . Ran Out of Food in the Last Year: Not on file  Transportation Needs:   . Lack of Transportation (Medical): Not on file  . Lack of Transportation (Non-Medical): Not on file  Physical Activity:   . Days of Exercise per Week: Not on file  . Minutes of Exercise per Session: Not on file  Stress:   . Feeling of Stress : Not on file  Social Connections:   . Frequency of Communication with Friends and Family: Not on file  . Frequency of Social Gatherings with Friends and Family: Not on file  . Attends Religious Services: Not on file  . Active Member of Clubs or Organizations: Not on file  . Attends Theatre manager Meetings: Not on file  . Marital Status: Not on file   Allergies  Allergen Reactions  . Rosuvastatin     Other reaction(s): GI Upset (intolerance) Crestor - Intestinal issues   . Statins Other (See Comments)    Crestor - Intestinal issues    Family History  Problem Relation Age of Onset  . Hypertension Mother   . Stroke Mother   . Diabetes Mother   . Hyperlipidemia Mother   . Obesity Mother   . Diabetes Sister   . Stroke Maternal Grandmother        38 died   . Heart disease Maternal Grandfather   . Hypertension Maternal Grandfather   . Diabetes Maternal Grandfather   . Arthritis Father   . Heart disease Father   . Heart disease Paternal Grandfather     Current Outpatient Medications (Endocrine & Metabolic):  .  metFORMIN (GLUCOPHAGE) 500 MG tablet, TAKE 1 TABLET BY MOUTH EVERY DAY WITH BREAKFAST .  predniSONE (DELTASONE) 50 MG tablet, 1 tablet by mouth daily  Current Outpatient Medications (Cardiovascular):  .  amLODipine (NORVASC) 5 MG tablet, Take 1.5 tablets (7.5 mg total) by mouth daily. Marland Kitchen  ezetimibe (ZETIA) 10 MG tablet, TAKE 1 TABLET BY MOUTH EVERY DAY .  flecainide (TAMBOCOR) 100 MG tablet, Take 0.5 tablets (50 mg total) by mouth 2 (two) times daily. Marland Kitchen  LIVALO 2 MG TABS, TAKE 1 TABLET BY MOUTH EVERY DAY .  metoprolol succinate (TOPROL-XL) 100 MG 24 hr tablet, TAKE 100 MG IN THE EVENINGS (ALONG WITH 25 MG OR 50 MG IN THE MORNINGS) .  olmesartan (BENICAR) 40 MG tablet, Take 1 tablet (40 mg total) by mouth daily. Marland Kitchen  spironolactone (ALDACTONE) 25 MG tablet, TAKE 1/2 TABLET BY MOUTH EVERY DAY   Current Outpatient Medications (Analgesics):  .  aspirin EC 81 MG tablet, Take 1 tablet (81 mg total) by mouth daily.  Current Outpatient Medications (Hematological):  Marland Kitchen  ELIQUIS 5 MG TABS tablet, TAKE 1 TABLET BY MOUTH TWICE A DAY  Current Outpatient Medications (Other):  Marland Kitchen  Ascorbic Acid (VITAMIN C) 1000 MG tablet, Take 1,000 mg by mouth daily.  .   Boswellia-Glucosamine-Vit D (GLUCOSAMINE COMPLEX PO), Take 3 tablets by mouth daily.  .  Coenzyme Q10 200 MG capsule, Take 200 mg by mouth daily.  .  Multiple Vitamin (MULITIVITAMIN WITH MINERALS) TABS, Take 1 tablet by mouth daily.  [name] 3-6-9 Fatty Acids (OMEGA-3-6-9 PO), Take 3 capsules by mouth every morning. Omega 3= 800mg ,omega 6=380mg ,omega 9 =300mg  .  tamsulosin (FLOMAX) 0.4 MG CAPS capsule, Take 0.4 mg by mouth daily. Marland Kitchen  triamcinolone cream (KENALOG) 0.5 %, APPLY 1 APPLICATION TOPICALLY 2 (TWO) TIMES DAILY.  TO AFFECTED AREAS. Marland Kitchen  zinc gluconate 50 MG tablet, Take 50 mg by mouth daily.  Marland Kitchen  gabapentin (NEURONTIN) 100 MG capsule, Take 2 capsules (200 mg total) by mouth at bedtime.   Reviewed prior external information including notes and imaging from  primary care provider As well as notes that were available from care everywhere and other healthcare systems.  Past medical history, social, surgical and family history all reviewed in electronic medical record.  No pertanent information unless stated regarding to the chief complaint.   Review of Systems:  No headache, visual changes, nausea, vomiting, diarrhea, constipation, dizziness, abdominal pain, skin rash, fevers, chills, night sweats, weight loss, swollen lymph nodes, body aches, joint swelling, chest pain, shortness of breath, mood changes. POSITIVE muscle aches  Objective  Blood pressure 140/86, pulse 64, height 6\' 3"  (1.905 m), weight 254 lb (115.2 kg), SpO2 96 %.   General: No apparent distress alert and oriented x3 mood and affect normal, dressed appropriately.  HEENT: Pupils equal, extraocular movements intact  Respiratory: Patient's speak in full sentences and does not appear short of breath  Cardiovascular: No lower extremity edema, non tender, no erythema  Back exam does have loss of lordosis, tightness noted in the paraspinal musculature.  Patient does have increasing discomfort with any extension greater than 10  degrees.  Tightness noted still of the right hip with decreased internal range of motion.  Left hip is unremarkable.  Strength of the lower extremity seems to be symmetric.     Osteopathic findings T8 extended rotated and side bent right L2 flexed rotated and side bent left L4 flexed rotated and side bent right Sacrum right on right Impression and Recommendations:     The above documentation has been reviewed and is accurate and complete [name], DO

## 2020-01-01 ENCOUNTER — Encounter: Payer: Self-pay | Admitting: Family Medicine

## 2020-01-02 ENCOUNTER — Encounter (INDEPENDENT_AMBULATORY_CARE_PROVIDER_SITE_OTHER): Payer: Self-pay | Admitting: Family Medicine

## 2020-01-06 ENCOUNTER — Other Ambulatory Visit (INDEPENDENT_AMBULATORY_CARE_PROVIDER_SITE_OTHER): Payer: Self-pay | Admitting: Family Medicine

## 2020-01-06 DIAGNOSIS — R7303 Prediabetes: Secondary | ICD-10-CM

## 2020-01-07 ENCOUNTER — Encounter (INDEPENDENT_AMBULATORY_CARE_PROVIDER_SITE_OTHER): Payer: Self-pay

## 2020-01-07 NOTE — Telephone Encounter (Signed)
MyChart message sent to pt to find out if they have enough medication to get them through until next appt.   

## 2020-01-07 NOTE — Telephone Encounter (Signed)
Last OV with Dr. Beasley 

## 2020-01-21 ENCOUNTER — Ambulatory Visit (INDEPENDENT_AMBULATORY_CARE_PROVIDER_SITE_OTHER): Payer: Medicare PPO | Admitting: Family Medicine

## 2020-01-21 ENCOUNTER — Other Ambulatory Visit: Payer: Self-pay

## 2020-01-21 ENCOUNTER — Encounter (INDEPENDENT_AMBULATORY_CARE_PROVIDER_SITE_OTHER): Payer: Self-pay | Admitting: Family Medicine

## 2020-01-21 VITALS — BP 131/78 | HR 74 | Temp 98.3°F | Ht 75.0 in | Wt 247.0 lb

## 2020-01-21 DIAGNOSIS — R7303 Prediabetes: Secondary | ICD-10-CM

## 2020-01-21 DIAGNOSIS — E669 Obesity, unspecified: Secondary | ICD-10-CM | POA: Diagnosis not present

## 2020-01-21 DIAGNOSIS — Z6831 Body mass index (BMI) 31.0-31.9, adult: Secondary | ICD-10-CM | POA: Diagnosis not present

## 2020-01-21 MED ORDER — METFORMIN HCL 500 MG PO TABS: 500.0000 mg | ORAL_TABLET | Freq: Every day | ORAL | 0 refills | Status: DC

## 2020-01-23 NOTE — Progress Notes (Signed)
Chief Complaint:   OBESITY Clayton [name] is here to discuss his progress with his obesity treatment plan along with follow-up of his obesity related diagnoses. Clayton [name] is on following a lower carbohydrate, vegetable and lean protein rich diet plan and states he is following his eating plan approximately 0% of the time. Clayton [name] states he is doing 0 minutes 0 times per week.  Today's visit was #: 25 Starting weight: 266 lbs Starting date: 11/01/2017 Today's weight: 247 lbs Today's date: 01/21/2020 Total lbs lost to date: 19 Total lbs lost since last in-office visit: 1  Interim History: Clayton [name] has been doing portion control and smarter choices for the last month while traveling and having company. He has still been mindful even when deviating from his low carbohydrate plan. His exercise has decreased due to various lower body pains, but he is working with his specialists to determine the next steps.  Subjective:   1. Pre-diabetes Clayton [name] is stable on metformin, and he denies nausea or vomiting but has a normal GFR. He has done very well with diet overall and he is getting to restart his low carbohydrate eating plan.  Assessment/Plan:   1. Pre-diabetes Clayton [name] will continue to work on weight loss, diet, exercise, and decreasing simple carbohydrates to help decrease the risk of diabetes. We will refill metformin for 90 days with no refills.  - metFORMIN (GLUCOPHAGE) 500 MG tablet; Take 1 tablet (500 mg total) by mouth daily with breakfast.  Dispense: 90 tablet; Refill: 0  2. Class 1 obesity with serious comorbidity and body mass index (BMI) of 31.0 to 31.9 in adult, unspecified obesity type Clayton [name] is currently in the action stage of change. As such, his goal is to continue with weight loss efforts. He has agreed to following a lower carbohydrate, vegetable and lean protein rich diet plan.   Exercise goals: All adults should avoid inactivity. Some physical activity is better than none, and adults  who participate in any amount of physical activity gain some health benefits.  Behavioral modification strategies: emotional eating strategies and avoiding temptations.  Clayton [name] has agreed to follow-up with our clinic in 3 months. He was informed of the importance of frequent follow-up visits to maximize his success with intensive lifestyle modifications for his multiple health conditions.   Objective:   Blood pressure 131/78, pulse 74, temperature 98.3 F (36.8 C), height 6\' 3"  (1.905 m), weight 247 lb (112 kg), SpO2 96 %. Body mass index is 30.87 kg/m.  General: Cooperative, alert, well developed, in no acute distress. HEENT: Conjunctivae and lids unremarkable. Cardiovascular: Regular rhythm.  Lungs: Normal work of breathing. Neurologic: No focal deficits.   Lab Results  Component Value Date   CREATININE 1.28 (H) 06/25/2019   BUN 30 (H) 06/25/2019   NA 139 06/25/2019   K 5.0 06/25/2019   CL 103 06/25/2019   CO2 24 06/25/2019   Lab Results  Component Value Date   ALT 20 06/25/2019   AST 20 06/25/2019   ALKPHOS 62 06/25/2019   BILITOT 0.8 06/25/2019   Lab Results  Component Value Date   HGBA1C 5.8 (H) 08/01/2019   HGBA1C 5.8 (H) 09/04/2018   HGBA1C 6.0 (H) 11/01/2017   HGBA1C 5.4 04/14/2016   HGBA1C 6.0 06/09/2015   Lab Results  Component Value Date   INSULIN 3.9 08/01/2019   INSULIN 9.0 09/04/2018   INSULIN 22.4 11/01/2017   Lab Results  Component Value Date   TSH 1.290 08/09/2018   Lab Results  Component Value  Date   CHOL 124 06/25/2019   HDL 56 06/25/2019   LDLCALC 55 06/25/2019   TRIG 61 06/25/2019   CHOLHDL 2.2 06/25/2019   Lab Results  Component Value Date   WBC 4.8 03/05/2019   HGB 15.6 03/13/2019   HCT 46.0 03/13/2019   MCV 90 03/05/2019   PLT 168 03/05/2019   No results found for: IRON, TIBC, FERRITIN  Attestation Statements:   Reviewed by clinician on day of visit: allergies, medications, problem list, medical history, surgical  history, family history, social history, and previous encounter notes.  Time spent on visit including pre-visit chart review and post-visit care and charting was 33 minutes.    I, Trixie Dredge, am acting as transcriptionist for Dennard Nip, MD.  I have reviewed the above documentation for accuracy and completeness, and I agree with the above. -  Dennard Nip, MD

## 2020-01-24 ENCOUNTER — Ambulatory Visit: Payer: Medicare PPO | Admitting: Family Medicine

## 2020-01-24 ENCOUNTER — Other Ambulatory Visit: Payer: Self-pay

## 2020-01-24 ENCOUNTER — Encounter: Payer: Self-pay | Admitting: Family Medicine

## 2020-01-24 VITALS — BP 150/90 | HR 73 | Ht 75.0 in | Wt 257.0 lb

## 2020-01-24 DIAGNOSIS — M1712 Unilateral primary osteoarthritis, left knee: Secondary | ICD-10-CM

## 2020-01-24 DIAGNOSIS — G8929 Other chronic pain: Secondary | ICD-10-CM

## 2020-01-24 DIAGNOSIS — M2550 Pain in unspecified joint: Secondary | ICD-10-CM

## 2020-01-24 DIAGNOSIS — M1611 Unilateral primary osteoarthritis, right hip: Secondary | ICD-10-CM

## 2020-01-24 DIAGNOSIS — M545 Low back pain, unspecified: Secondary | ICD-10-CM

## 2020-01-24 LAB — CBC WITH DIFFERENTIAL/PLATELET
Basophils Absolute: 0 10*3/uL (ref 0.0–0.1)
Basophils Relative: 0.5 % (ref 0.0–3.0)
Eosinophils Absolute: 0.1 10*3/uL (ref 0.0–0.7)
Eosinophils Relative: 3 % (ref 0.0–5.0)
HCT: 42.6 % (ref 39.0–52.0)
Hemoglobin: 14.3 g/dL (ref 13.0–17.0)
Lymphocytes Relative: 21.4 % (ref 12.0–46.0)
Lymphs Abs: 0.8 10*3/uL (ref 0.7–4.0)
MCHC: 33.5 g/dL (ref 30.0–36.0)
MCV: 89.7 fl (ref 78.0–100.0)
Monocytes Absolute: 0.5 10*3/uL (ref 0.1–1.0)
Monocytes Relative: 13.9 % — ABNORMAL HIGH (ref 3.0–12.0)
Neutro Abs: 2.4 10*3/uL (ref 1.4–7.7)
Neutrophils Relative %: 61.2 % (ref 43.0–77.0)
Platelets: 221 10*3/uL (ref 150.0–400.0)
RBC: 4.75 Mil/uL (ref 4.22–5.81)
RDW: 13 % (ref 11.5–15.5)
WBC: 3.9 10*3/uL — ABNORMAL LOW (ref 4.0–10.5)

## 2020-01-24 LAB — COMPREHENSIVE METABOLIC PANEL
ALT: 21 U/L (ref 0–53)
AST: 21 U/L (ref 0–37)
Albumin: 4.5 g/dL (ref 3.5–5.2)
Alkaline Phosphatase: 63 U/L (ref 39–117)
BUN: 30 mg/dL — ABNORMAL HIGH (ref 6–23)
CO2: 28 mEq/L (ref 19–32)
Calcium: 10.1 mg/dL (ref 8.4–10.5)
Chloride: 106 mEq/L (ref 96–112)
Creatinine, Ser: 1.4 mg/dL (ref 0.40–1.50)
GFR: 49.88 mL/min — ABNORMAL LOW (ref >60.00)
Glucose, Bld: 109 mg/dL — ABNORMAL HIGH (ref 70–99)
Potassium: 4.3 mEq/L (ref 3.5–5.1)
Sodium: 139 mEq/L (ref 135–145)
Total Bilirubin: 0.6 mg/dL (ref 0.2–1.2)
Total Protein: 7.3 g/dL (ref 6.0–8.3)

## 2020-01-24 LAB — FERRITIN: Ferritin: 77.1 ng/mL (ref 22.0–322.0)

## 2020-01-24 LAB — IBC PANEL
Iron: 71 ug/dL (ref 42–165)
Saturation Ratios: 18.5 % — ABNORMAL LOW (ref 20.0–50.0)
Transferrin: 274 mg/dL (ref 212.0–360.0)

## 2020-01-24 LAB — SEDIMENTATION RATE: Sed Rate: 22 mm/hr — ABNORMAL HIGH (ref 0–20)

## 2020-01-24 LAB — TSH: TSH: 2.12 u[IU]/mL (ref 0.35–4.50)

## 2020-01-24 LAB — VITAMIN D 25 HYDROXY (VIT D DEFICIENCY, FRACTURES): VITD: 39.95 ng/mL (ref 30.00–100.00)

## 2020-01-24 MED ORDER — DIAZEPAM 5 MG PO TABS
ORAL_TABLET | ORAL | 0 refills | Status: DC
Start: 2020-01-24 — End: 2020-06-30

## 2020-01-24 NOTE — Patient Instructions (Addendum)
Good to see you Dr. Charlann Boxer at [facility] today MRI of lumbar spin See me again in 6 weeks but we will be talking otherwise

## 2020-01-24 NOTE — Progress Notes (Signed)
Clayton [name] [address] [place] [address] Phone: [phone] Subjective:   I Clayton [name] for Dr. Antoine Primas.  This visit occurred during the SARS-CoV-2 public health emergency.  Safety protocols were in place, including screening questions prior to the visit, additional usage of staff PPE, and extensive cleaning of exam room while observing appropriate contact time as indicated for disinfecting solutions.   I'm seeing this patient by the request  of:  Clayton [name], Clayton [name], Clayton [name]  CC: Hip and leg pain  Clayton [name]:Clayton [name]  Clayton [name] is [name] 75 y.o. male coming in with complaint of bilateral leg pain. OMT 12/26/2019. Patient states his legs have been worse. Prednisone helped with the pain. States he hasn't been doing anything. Having trouble sleeping due to pain at night.  Patient continues to take the gabapentin at night.  Patient states that the hip pain seems to be worsening as well.  Seems to be more in the groin area going down the internal aspect of the leg [name] little more.  Affecting daily activities.  Patient states even standing in one position with more cramping of the legs as well in the calves.  Patient describes it is more of an ache than true pain but seems to be worsening and keeping him from activities.          Reviewed prior external information including notes and imaging from previsou exam, outside providers and external EMR if available.   As well as notes that were available from care everywhere and other healthcare systems.  Past medical history, social, surgical and family history all reviewed in electronic medical record.  No pertanent information unless stated regarding to the chief complaint.   Past Medical History:  Diagnosis Date  . Arthritis    hips   . Atrial fibrillation (HCC)   . Cataract   . CPAP (continuous positive airway pressure) dependence   . Dysrhythmia    PAF- hx of 7 years ago   .  HLD (hyperlipidemia)   . Hypertension 05/29/10   ECHO-EF 67% wnl  . Meniere disease   . Obesity   . Palpitations   . Sleep apnea 09/14/04   [facility]- Dr. Welton Flakes; CPAP titration 11/12/04- Dr. Welton Flakes  . Stroke [facility])     Allergies  Allergen Reactions  . Rosuvastatin     Other reaction(s): GI Upset (intolerance) Crestor - Intestinal issues   . Statins Other (See Comments)    Crestor - Intestinal issues      Review of Systems:  No headache, visual changes, nausea, vomiting, diarrhea, constipation, dizziness, abdominal pain, skin rash, fevers, chills, night sweats, weight loss, swollen lymph nodes, , joint swelling, chest pain, shortness of breath, mood changes. POSITIVE muscle aches, body aches  Objective  Blood pressure (!) 150/90, pulse 73, height 6\' 3"  (1.905 m), weight 257 lb (116.6 kg), SpO2 96 %.   General: No apparent distress alert and oriented x3 mood and affect normal, dressed appropriately.  HEENT: Pupils equal, extraocular movements intact  Respiratory: Patient's speak in full sentences and does not appear short of breath  Cardiovascular: No lower extremity edema, non tender, no erythema  Mild antalgic gait favoring the right hip Right hip exam does have decreased range of motion with only 5 to 10 degrees of internal range of motion.  No crepitus noted.  Decent strength but does have pain with resisted flexion of the hip. Low back exam does have  some mild loss of lordosis.  Some tenderness to palpation of the paraspinal musculature.  Tightness of the hamstrings which is different than patient's baseline.  Neurovascularly intact distally and deep tendon reflexes are intact.      Assessment and Plan:        The above documentation has been reviewed and is accurate and complete Judi Saa, Clayton [name]       Note: This dictation was prepared with Dragon dictation along with smaller phrase technology. Any transcriptional errors that result from this  process are unintentional.

## 2020-01-24 NOTE — Assessment & Plan Note (Signed)
Patient does have arthritis of the right hip.  Patient has had replacement of the left hip by Dr.Olin Discussed with patient in great length.  At this point patient would like referral to orthopedic surgery to discuss potential replacement.  I believe that this is a separate issue than his back at the moment and likely will be need to be done at some point in the future.  Patient is in agreement and will be referred today.

## 2020-01-24 NOTE — Assessment & Plan Note (Signed)
Still doing fantastic after viscosupplementation

## 2020-01-24 NOTE — Assessment & Plan Note (Signed)
Patient is low back pain and is now having bilateral leg pain.  Seems to be consistent with the story of potentially spinal stenosis.  At this point I do feel advanced imaging is warranted.  Patient did have some instability noted with flexion and extension of the lumbar spine.  Patient could be a candidate for potential epidurals.  Patient is on Eliquis and would have to bridge but I do think that that is a possibility.  Patient of course would like to avoid any type of surgical intervention but does want to be as active as possible and would consider this if necessary.  Continue the gabapentin and potentially increase to 300 mg at night.  We will get laboratory work-up with patient having difficulty with his kidney function previously.  Depending on imaging we will discuss further evaluation and treatment.

## 2020-01-25 LAB — PTH, INTACT AND CALCIUM
Calcium: 10.2 mg/dL (ref 8.6–10.3)
PTH: 54 pg/mL (ref 14–64)

## 2020-01-25 LAB — CALCIUM, IONIZED: Calcium, Ion: 5.4 mg/dL (ref 4.8–5.6)

## 2020-02-07 ENCOUNTER — Ambulatory Visit: Payer: Medicare PPO | Admitting: Family Medicine

## 2020-02-07 DIAGNOSIS — M1611 Unilateral primary osteoarthritis, right hip: Secondary | ICD-10-CM

## 2020-02-07 NOTE — Progress Notes (Deleted)
[place] Phone: [phone] Subjective:    I'm seeing this patient by the request  of:  Kuneff, Renee A, DO  CC:   [id number]   01/24/2020 Patient does have arthritis of the right hip.  Patient has had replacement of the left hip by Dr.Olin Discussed with patient in great length.  At this point patient would like referral to orthopedic surgery to discuss potential replacement.  I believe that this is a separate issue than his back at the moment and likely will be need to be done at some point in the future.  Patient is in agreement and will be referred today.  Patient is low back pain and is now having bilateral leg pain.  Seems to be consistent with the story of potentially spinal stenosis.  At this point I do feel advanced imaging is warranted.  Patient did have some instability noted with flexion and extension of the lumbar spine.  Patient could be a candidate for potential epidurals.  Patient is on Eliquis and would have to bridge but I do think that that is a possibility.  Patient of course would like to avoid any type of surgical intervention but does want to be as active as possible and would consider this if necessary.  Continue the gabapentin and potentially increase to 300 mg at night.  We will get laboratory work-up with patient having difficulty with his kidney function previously.  Depending on imaging we will discuss further evaluation and treatment.  Update 02/07/2020 MUHANAD TOROSYAN is a 75 y.o. male coming in with complaint of right hip and low back  Pain. MRI scheduled for 02/17/2020.  Onset-  Location Duration-  Character- Aggravating factors- Reliving factors-  Therapies tried-  Severity-     Past Medical History:  Diagnosis Date  . Arthritis    hips   . Atrial fibrillation ([place])   . Cataract   . CPAP (continuous positive airway pressure) dependence   . Dysrhythmia    PAF- hx of 7  years ago   . HLD (hyperlipidemia)   . Hypertension 05/29/10   ECHO-EF 67% wnl  . Meniere disease   . Obesity   . Palpitations   . Sleep apnea 09/14/04   [facility]- Dr. Humphrey Rolls; CPAP titration 11/12/04- Dr. Humphrey Rolls  . Stroke [facility])    Past Surgical History:  Procedure Laterality Date  . CARDIAC CATHETERIZATION  11/03/05  . CARDIOVERSION N/A 09/26/2018   Procedure: CARDIOVERSION;  Surgeon: Geralynn Rile, MD;  Location: [place];  Service: Endoscopy;  Laterality: N/A;  . CARDIOVERSION N/A 03/13/2019   Procedure: CARDIOVERSION;  Surgeon: Skeet Latch, MD;  Location: [place];  Service: Cardiovascular;  Laterality: N/A;  . CATARACT EXTRACTION  2014  . KNEE ARTHROSCOPY     left knee- 2002   . TOTAL HIP ARTHROPLASTY  06/08/2011   Procedure: TOTAL HIP ARTHROPLASTY ANTERIOR APPROACH;  Surgeon: [name], MD;  Location: [facility];  Service: Orthopedics;  Laterality: Left;   Social History   Socioeconomic History  . Marital status: Married    Spouse name: Derl Abalos  . Number of children: Not on file  . Years of education: Not on file  . Highest education level: Not on file  Occupational History  . Occupation: Retired  Tobacco Use  . Smoking status: Never Smoker  . Smokeless tobacco: Never Used  Vaping Use  . Vaping Use: Never used  Substance and  Sexual Activity  . Alcohol use: No  . Drug use: No  . Sexual activity: Yes    Birth control/protection: None  Other Topics Concern  . Not on file  Social History Narrative   Married. [name].Takes care of his mother-in-law as well.    Retired, Conservator, museum/gallery.    Drinks caffeine beverages. Takes a daily vitamin.   Exercises routinely.    Smoke detector in the home.    Ambulates independently.    Social Determinants of Health   Financial Resource Strain: Not on file  Food Insecurity: Not on file  Transportation Needs: Not on file  Physical Activity: Not on file  Stress: Not on file  Social  Connections: Not on file   Allergies  Allergen Reactions  . Rosuvastatin     Other reaction(s): GI Upset (intolerance) Crestor - Intestinal issues   . Statins Other (See Comments)    Crestor - Intestinal issues    Family History  Problem Relation Age of Onset  . Hypertension Mother   . Stroke Mother   . Diabetes Mother   . Hyperlipidemia Mother   . Obesity Mother   . Diabetes Sister   . Stroke Maternal Grandmother        31 died   . Heart disease Maternal Grandfather   . Hypertension Maternal Grandfather   . Diabetes Maternal Grandfather   . Arthritis Father   . Heart disease Father   . Heart disease Paternal Grandfather     Current Outpatient Medications (Endocrine & Metabolic):  .  metFORMIN (GLUCOPHAGE) 500 MG tablet, Take 1 tablet (500 mg total) by mouth daily with breakfast.  Current Outpatient Medications (Cardiovascular):  .  amLODipine (NORVASC) 5 MG tablet, Take 1.5 tablets (7.5 mg total) by mouth daily. Marland Kitchen  ezetimibe (ZETIA) 10 MG tablet, TAKE 1 TABLET BY MOUTH EVERY DAY .  flecainide (TAMBOCOR) 50 MG tablet, Take 50 mg by mouth daily. Marland Kitchen  LIVALO 2 MG TABS, TAKE 1 TABLET BY MOUTH EVERY DAY .  metoprolol succinate (TOPROL-XL) 100 MG 24 hr tablet, TAKE 100 MG IN THE EVENINGS (ALONG WITH 25 MG OR 50 MG IN THE MORNINGS) .  olmesartan (BENICAR) 40 MG tablet, Take 1 tablet (40 mg total) by mouth daily. Marland Kitchen  spironolactone (ALDACTONE) 25 MG tablet, TAKE 1/2 TABLET BY MOUTH EVERY DAY   Current Outpatient Medications (Analgesics):  .  aspirin EC 81 MG tablet, Take 1 tablet (81 mg total) by mouth daily.  Current Outpatient Medications (Hematological):  Marland Kitchen  ELIQUIS 5 MG TABS tablet, TAKE 1 TABLET BY MOUTH TWICE A DAY  Current Outpatient Medications (Other):  Marland Kitchen  Ascorbic Acid (VITAMIN C) 1000 MG tablet, Take 1,000 mg by mouth daily.  .  Boswellia-Glucosamine-Vit D (GLUCOSAMINE COMPLEX PO), Take 3 tablets by mouth daily.  .  Coenzyme Q10 200 MG capsule, Take 200 mg by mouth  daily.  .  diazepam (VALIUM) 5 MG tablet, One tab by mouth, 2 hours before procedure. .  gabapentin (NEURONTIN) 100 MG capsule, Take 2 capsules (200 mg total) by mouth at bedtime. .  Multiple Vitamin (MULITIVITAMIN WITH MINERALS) TABS, Take 1 tablet by mouth daily.  .  tamsulosin (FLOMAX) 0.4 MG CAPS capsule, Take 0.4 mg by mouth daily. Marland Kitchen  triamcinolone cream (KENALOG) 0.5 %, APPLY 1 APPLICATION TOPICALLY 2 (TWO) TIMES DAILY. TO AFFECTED AREAS. Marland Kitchen  zinc gluconate 50 MG tablet, Take 50 mg by mouth daily.    Reviewed prior external information including notes and imaging from  primary care provider As well as notes that were available from care everywhere and other healthcare systems.  Past medical history, social, surgical and family history all reviewed in electronic medical record.  No pertanent information unless stated regarding to the chief complaint.   Review of Systems:  No headache, visual changes, nausea, vomiting, diarrhea, constipation, dizziness, abdominal pain, skin rash, fevers, chills, night sweats, weight loss, swollen lymph nodes, body aches, joint swelling, chest pain, shortness of breath, mood changes. POSITIVE muscle aches  Objective  There were no vitals taken for this visit.   General: No apparent distress alert and oriented x3 mood and affect normal, dressed appropriately.  HEENT: Pupils equal, extraocular movements intact  Respiratory: Patient's speak in full sentences and does not appear short of breath  Cardiovascular: No lower extremity edema, non tender, no erythema  Gait normal with good balance and coordination.  MSK:  Non tender with full range of motion and good stability and symmetric strength and tone of shoulders, elbows, wrist, hip, knee and ankles bilaterally.     Impression and Recommendations:     The above documentation has been reviewed and is accurate and complete Jacqualin Combes

## 2020-02-07 NOTE — Progress Notes (Signed)
Virtual Visit via Video Note  I connected with Clayton [name] on 02/07/20 at  4:15 PM EST by a video enabled telemedicine application and verified that I am speaking with the correct person using two identifiers.  Patient called and does have a quick question at this time.  Patient was having more COVID symptoms will cancel his appointment today.  Patient is doing relatively well with that.  Just wanted discussed the potential on the MRI.  Patient is scheduled for the MRI on the 30th and we will see patient days after that to further discuss on the back and proper treatment.  Patient is following up with orthopedics for his hip.  Patient did get a false phone call about physical therapy which we did tell him we did not come from our office.   [name], DO

## 2020-02-08 ENCOUNTER — Encounter: Payer: Self-pay | Admitting: Family Medicine

## 2020-02-17 ENCOUNTER — Other Ambulatory Visit: Payer: Self-pay

## 2020-02-17 ENCOUNTER — Ambulatory Visit
Admission: RE | Admit: 2020-02-17 | Discharge: 2020-02-17 | Disposition: A | Discharge status: Home / Self Care | Payer: Medicare PPO | Source: Ambulatory Visit | Attending: Family Medicine | Admitting: Family Medicine

## 2020-02-17 DIAGNOSIS — M545 Low back pain, unspecified: Secondary | ICD-10-CM | POA: Diagnosis not present

## 2020-02-17 DIAGNOSIS — M2550 Pain in unspecified joint: Secondary | ICD-10-CM

## 2020-02-17 DIAGNOSIS — M48061 Spinal stenosis, lumbar region without neurogenic claudication: Secondary | ICD-10-CM | POA: Diagnosis not present

## 2020-02-18 ENCOUNTER — Encounter: Payer: Self-pay | Admitting: Family Medicine

## 2020-02-19 ENCOUNTER — Other Ambulatory Visit: Payer: Self-pay

## 2020-02-19 ENCOUNTER — Encounter: Payer: Self-pay | Admitting: Family Medicine

## 2020-02-19 ENCOUNTER — Ambulatory Visit: Payer: Medicare PPO | Admitting: Family Medicine

## 2020-02-19 DIAGNOSIS — M545 Low back pain, unspecified: Secondary | ICD-10-CM | POA: Diagnosis not present

## 2020-02-19 DIAGNOSIS — G8929 Other chronic pain: Secondary | ICD-10-CM

## 2020-02-19 DIAGNOSIS — M1611 Unilateral primary osteoarthritis, right hip: Secondary | ICD-10-CM

## 2020-02-19 NOTE — Assessment & Plan Note (Signed)
I believe the patient's pain seems to be more secondary to the right hip.  Patient does have more of the arthritic changes and I think that is likely surgical intervention is more necessary.  Patient's MRI of the lumbar spine does show that patient does have some advanced arthritic changes but nerve root impingement seems to be left-sided more than right side.  We discussed different treatment options such as an epidural of the back to discuss prior to surgery but at this point I do feel pretty strongly that the hip likely needs replacement.  Patient has been referred to orthopedic surgery to discuss.

## 2020-02-19 NOTE — Progress Notes (Signed)
Clayton [name] Clayton [name] Phone: [phone] Subjective:   Clayton [name], am serving as a scribe for Dr. Hulan Saas.   I'm seeing this patient by the request  of:  [name], Clayton A, DO  CC: Right hip and back pain follow-up  [id number]   01/24/2020 Patient is low back pain and is now having bilateral leg pain.  Seems to be consistent with the story of potentially spinal stenosis.  At this point I do feel advanced imaging is warranted.  Patient did have some instability noted with flexion and extension of the lumbar spine.  Patient could be a candidate for potential epidurals.  Patient is on Eliquis and would have to bridge but I do think that that is a possibility.  Patient of course would like to avoid any type of surgical intervention but does want to be as active as possible and would consider this if necessary.  Continue the gabapentin and potentially increase to 300 mg at night.  We will get laboratory work-up with patient having difficulty with his kidney function previously.  Depending on imaging we will discuss further evaluation and treatment.  Patient does have arthritis of the right hip.  Patient has had replacement of the left hip by Dr.Olin Discussed with patient in great length.  At this point patient would like referral to orthopedic surgery to discuss potential replacement.  I believe that this is a separate issue than his back at the moment and likely will be need to be done at some point in the future.  Patient is in agreement and will be referred today.  02/19/2020 Clayton [name] is a 75 y.o. male coming in with complaint of right hip and low back pain. Patient states that the pain in right shin has dissipated. Has been using gabapentin which has helped his pain. Also using TENS and ice. Able to sleep better now at night.   Has appt with Dr. Alvan [name] on 03/03/20. Would like to get opinion today of what he should do now  that we have MRI results.   MRI 02/17/20 Lumbar IMPRESSION: Multilevel lumbar degenerative change causing subarticular and foraminal stenosis bilaterally but seems to be more left greater than right.  Multiple left renal cysts.   Patient states that most the pain still seems to be more in the right hip area than anywhere else.  Some in the gluteal area.  Patient states not having any significant back pain.  [name] radiation down the leg.  Past Medical History:  Diagnosis Date  . Arthritis    hips   . Atrial fibrillation ([place])   . Cataract   . CPAP (continuous positive airway pressure) dependence   . Dysrhythmia    PAF- hx of 7 years ago   . HLD (hyperlipidemia)   . Hypertension 05/29/10   ECHO-EF 67% wnl  . Meniere disease   . Obesity   . Palpitations   . Sleep apnea 09/14/04   [facility]- Dr. Humphrey Rolls; CPAP titration 11/12/04- Dr. Humphrey Rolls  . Stroke [facility])    Past Surgical History:  Procedure Laterality Date  . CARDIAC CATHETERIZATION  11/03/05  . CARDIOVERSION N/A 09/26/2018   Procedure: CARDIOVERSION;  Surgeon: Geralynn Rile, MD;  Location: [facility];  Service: Endoscopy;  Laterality: N/A;  . CARDIOVERSION N/A 03/13/2019   Procedure: CARDIOVERSION;  Surgeon: Skeet Latch, MD;  Location: [facility];  Service: Cardiovascular;  Laterality: N/A;  . CATARACT EXTRACTION  2014  . KNEE ARTHROSCOPY     left knee- 2002   . TOTAL HIP ARTHROPLASTY  06/08/2011   Procedure: TOTAL HIP ARTHROPLASTY ANTERIOR APPROACH;  Surgeon: [name], MD;  Location: [facility];  Service: Orthopedics;  Laterality: Left;   Social History   Socioeconomic History  . Marital status: Married    Spouse name: Clayton [name]  . Number of children: Not on file  . Years of education: Not on file  . Highest education level: Not on file  Occupational History  . Occupation: Retired  Tobacco Use  . Smoking status: Never Smoker  . Smokeless tobacco: Never Used  Vaping Use  . Vaping Use:  Never used  Substance and Sexual Activity  . Alcohol use: [name]  . Drug use: [name]  . Sexual activity: Yes    Birth control/protection: None  Other Topics Concern  . Not on file  Social History Narrative   Married. Clayton [name].Takes care of his mother-in-law as well.    Retired, Conservator, museum/gallery.    Drinks caffeine beverages. Takes a daily vitamin.   Exercises routinely.    Smoke detector in the home.    Ambulates independently.    Social Determinants of Health   Financial Resource Strain: Not on file  Food Insecurity: Not on file  Transportation Needs: Not on file  Physical Activity: Not on file  Stress: Not on file  Social Connections: Not on file   Allergies  Allergen Reactions  . Rosuvastatin     Other reaction(s): GI Upset (intolerance) Crestor - Intestinal issues   . Statins Other (See Comments)    Crestor - Intestinal issues    Family History  Problem Relation Age of Onset  . Hypertension Mother   . Stroke Mother   . Diabetes Mother   . Hyperlipidemia Mother   . Obesity Mother   . Diabetes Sister   . Stroke Maternal Grandmother        51 died   . Heart disease Maternal Grandfather   . Hypertension Maternal Grandfather   . Diabetes Maternal Grandfather   . Arthritis Father   . Heart disease Father   . Heart disease Paternal Grandfather     Current Outpatient Medications (Endocrine & Metabolic):  .  metFORMIN (GLUCOPHAGE) 500 MG tablet, Take 1 tablet (500 mg total) by mouth daily with breakfast.  Current Outpatient Medications (Cardiovascular):  .  amLODipine (NORVASC) 5 MG tablet, Take 1.5 tablets (7.5 mg total) by mouth daily. Marland Kitchen  ezetimibe (ZETIA) 10 MG tablet, TAKE 1 TABLET BY MOUTH EVERY DAY .  flecainide (TAMBOCOR) 50 MG tablet, Take 50 mg by mouth daily. Marland Kitchen  LIVALO 2 MG TABS, TAKE 1 TABLET BY MOUTH EVERY DAY .  metoprolol succinate (TOPROL-XL) 100 MG 24 hr tablet, TAKE 100 MG IN THE EVENINGS (ALONG WITH 25 MG OR 50 MG IN THE MORNINGS) .  olmesartan  (BENICAR) 40 MG tablet, Take 1 tablet (40 mg total) by mouth daily. Marland Kitchen  spironolactone (ALDACTONE) 25 MG tablet, TAKE 1/2 TABLET BY MOUTH EVERY DAY   Current Outpatient Medications (Analgesics):  .  aspirin EC 81 MG tablet, Take 1 tablet (81 mg total) by mouth daily.  Current Outpatient Medications (Hematological):  Marland Kitchen  ELIQUIS 5 MG TABS tablet, TAKE 1 TABLET BY MOUTH TWICE A DAY  Current Outpatient Medications (Other):  Marland Kitchen  Ascorbic Acid (VITAMIN C) 1000 MG tablet, Take 1,000 mg by mouth daily.  .  Boswellia-Glucosamine-Vit D (GLUCOSAMINE COMPLEX PO), Take 3 tablets by  mouth daily.  .  Coenzyme Q10 200 MG capsule, Take 200 mg by mouth daily.  .  diazepam (VALIUM) 5 MG tablet, One tab by mouth, 2 hours before procedure. .  gabapentin (NEURONTIN) 100 MG capsule, Take 2 capsules (200 mg total) by mouth at bedtime. .  Multiple Vitamin (MULITIVITAMIN WITH MINERALS) TABS, Take 1 tablet by mouth daily.  .  tamsulosin (FLOMAX) 0.4 MG CAPS capsule, Take 0.4 mg by mouth daily. Marland Kitchen  triamcinolone cream (KENALOG) 0.5 %, APPLY 1 APPLICATION TOPICALLY 2 (TWO) TIMES DAILY. TO AFFECTED AREAS. Marland Kitchen  zinc gluconate 50 MG tablet, Take 50 mg by mouth daily.    Reviewed prior external information including notes and imaging from  primary care provider As well as notes that were available from care everywhere and other healthcare systems.  Past medical history, social, surgical and family history all reviewed in electronic medical record.  [name] pertanent information unless stated regarding to the chief complaint.   Review of Systems:  [name] headache, visual changes, nausea, vomiting, diarrhea, constipation, dizziness, abdominal pain, skin rash, fevers, chills, night sweats, weight loss, swollen lymph nodes, body aches, joint swelling, chest pain, shortness of breath, mood changes. POSITIVE muscle aches  Objective  Blood pressure 138/82, pulse 75, height 6\' 3"  (1.905 m), weight 257 lb (116.6 kg), SpO2 97 %.    General: [name] apparent distress alert and oriented x3 mood and affect normal, dressed appropriately.  HEENT: Pupils equal, extraocular movements intact  Respiratory: Patient's speak in full sentences and does not appear short of breath  Cardiovascular: [name] lower extremity edema, non tender, [name] erythema  Gait normal with good balance and coordination.  MSK: Right hip exam does have significant decrease in internal range of motion with only little less than 5 degrees.  External at 30 degrees.  Patient does have good strength.  Negative straight leg test bilaterally but does have tightness of straight leg test bilaterally.  Tightness in the paraspinal musculature of the lumbar spine noted [name] midline tenderness.    Impression and Recommendations:     The above documentation has been reviewed and is accurate and complete [name], DO

## 2020-02-19 NOTE — Patient Instructions (Signed)
Good to see you I will send Clayton [name] a note Send a message when you see him I think it is more hip than back If back gives more trouble lets consider epidural which we will play by ear I am here if you have questions

## 2020-02-19 NOTE — Assessment & Plan Note (Signed)
Patient does have multilevel advanced degenerative changes causing some foraminal stenosis but seems to be left greater than right especially of the left S1 nerve root.  Patient symptoms do not fit this and it seems to be more on the right side.  Because of this I do feel that most of the pain is secondary to the right hip pain.  If worsening pain we would consider an epidural but patient is on Eliquis and would need approval from cardiology to discontinue medication for short-term we can do that but have patient follow-up with orthopedic to discuss surgical intervention.

## 2020-02-22 ENCOUNTER — Encounter: Payer: Self-pay | Admitting: Family Medicine

## 2020-02-29 DIAGNOSIS — G4733 Obstructive sleep apnea (adult) (pediatric): Secondary | ICD-10-CM | POA: Diagnosis not present

## 2020-03-03 ENCOUNTER — Encounter: Payer: Self-pay | Admitting: Family Medicine

## 2020-03-03 DIAGNOSIS — M1611 Unilateral primary osteoarthritis, right hip: Secondary | ICD-10-CM | POA: Diagnosis not present

## 2020-03-03 DIAGNOSIS — Z96642 Presence of left artificial hip joint: Secondary | ICD-10-CM | POA: Diagnosis not present

## 2020-03-10 ENCOUNTER — Other Ambulatory Visit: Payer: Self-pay

## 2020-03-10 ENCOUNTER — Encounter: Payer: Self-pay | Admitting: Cardiovascular Disease

## 2020-03-10 ENCOUNTER — Ambulatory Visit: Payer: Medicare PPO | Admitting: Cardiovascular Disease

## 2020-03-10 VITALS — BP 160/90 | HR 82 | Ht 75.0 in | Wt 264.4 lb

## 2020-03-10 DIAGNOSIS — I10 Essential (primary) hypertension: Secondary | ICD-10-CM

## 2020-03-10 DIAGNOSIS — I4510 Unspecified right bundle-branch block: Secondary | ICD-10-CM | POA: Diagnosis not present

## 2020-03-10 DIAGNOSIS — E782 Mixed hyperlipidemia: Secondary | ICD-10-CM | POA: Diagnosis not present

## 2020-03-10 DIAGNOSIS — Z7901 Long term (current) use of anticoagulants: Secondary | ICD-10-CM

## 2020-03-10 DIAGNOSIS — E669 Obesity, unspecified: Secondary | ICD-10-CM

## 2020-03-10 DIAGNOSIS — Z9989 Dependence on other enabling machines and devices: Secondary | ICD-10-CM

## 2020-03-10 DIAGNOSIS — G4733 Obstructive sleep apnea (adult) (pediatric): Secondary | ICD-10-CM | POA: Diagnosis not present

## 2020-03-10 DIAGNOSIS — I480 Paroxysmal atrial fibrillation: Secondary | ICD-10-CM

## 2020-03-10 NOTE — Progress Notes (Signed)
Patient ID: Clayton [name], male   DOB: 06/13/1945, 75 y.o.   MRN: [id number]     HPI: Clayton [name] is a 75 y.o. male who presents for a 4 month follow-up cardiology evaluation.  Clayton [name] has a history of hypertension, a remote history of PAF, status post cardioversion in 2007, obstructive sleep apnea for which he uses CPAP 100% of the time, hypertension, and hyperlipidemia. He has had weight fluctuations over the years.  In the past, he also had a history of Mnire's disease.  There was some concern by Clayton [name] that perhaps this may have been related to statin use, which led to its discontinuance.  He is not had Mnire's disease in some time.  He underwent left hip replacement surgery by Clayton [name] and now has been able to continue to be active and exercises routinely.   His  blood pressure regimen has been amlodipine/benazepril 10/40 daily, hydrochlorothiazide has been taking 25 Mill grams in the evening and Toprol-XL 100 mg daily. He had atrial of livalo  For hyperlipidemia but developed some diarrhea secondary to this.    He has not had much success with weight loss over the past several years.    He continues to use his CPAP therapy with 100% compliance.  He will not even take a nap without his CPAP therapy and he takes his CPAP unit on alll his travels.   Since initiating CPAP therapy, he is unaware of any palpitations.  He denies any recurrent atrial fibrillation.  In the past he has had difficulty with family stress and his father had passed away, and his wife's mother recently died after having developed lung CA and had significant PVD.  He was not exercising as much as he had in the past.  He has not been successful in weight loss.  He continues to be active and still walks 3-4 miles per day.  Oftentimes in the morning when he awakens his blood pressure is elevated at 150/90 but after exercise it drops to 128/80.  He is unaware of any recurrent atrial fibrillation.  He continues  to use CPAP with 100% compliance.   He completed a two-year grieving process.  On 01/19/2016 he committed to weight loss.  On January 1, he weighed 278 pounds .  He has been walking 4 miles 5 days per week as well as some intermittent stationary bike.  He has lost a total of 32 pounds since January.  He continues to use CPAP with 100% compliance and since he has been on CPAP, he has not had any recurrent atrial fibrillation and his blood pressure has been less labile.  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  He has had failures to Crestor and Lipitor in the past due to significant myalgias, development, even with weekly dosing.  I resumed zetia   In August 2018 he noticed his heart rate increasing and he presented for evaluation and was seen by Clayton [name], PAC.  He admitted to a rare palpitation.  He denied any chest pain with exertion or dyspnea on exertion.  He has suffered a broken rib.  This year.  He has a weight goal of 220 pounds.    I saw him in September 2018, at which time he was doing well from a cardiovascular standpoint.   His father was ill and ultimately passed away in [place].  As result he was going back and forth to the [facility] area.  This resulted in  a change in his diet and ability to exercise.  I last saw him in October 2019 and his weight had increased from 240 back up to 270 pounds.  He was unaware of any episodes of atrial fibrillation.  He was using CPAP with 100% compliance along with his naps.  He was committed to begin weight loss again.    Since I saw him on October 19, 2018 he has had major lifestyle change.  He has been working with Clayton [name] in the healthy weight and wellness group.  Peak weight was 280 and most recent weight 244.  I saw him for a cardiology evaluation on August 09, 2018.  O ver the past 4 months, he had been under increased stress as result of his wife's illness.  He has been checking his blood pressure regularly and this has been stable  but on his blood pressure recording he has noticed the message of heart rate irregularity.  He is unaware of A. fib but has noticed this message for the last 3 to 4 months.  He is exercising every day and typically walks 2 miles in the morning and in the afternoon or evening does 30 minutes of stationary bike at least 5 to 6 days/week.  He continues to use CPAP.  I obtained a download in the office today from June 22 through August 08, 2018.  He is 100% compliant.  He has a ResMed air sense 10 auto unit with a pressure range of 8-20 with 95% pressure at 10.1 and maximum average and 11.5.  AHI is excellent at 1.2.     During his August 09, 2018 encounter his ECG demonstrated that he was back in atrial fibrillation and had a ventricular rate at 89 bpm.  There was LVH with repolarization changes.  At that time, I recommended discontinuance of amlodipine/benazepril combination and in its place started Cardizem CD 180 mg rather than amlodipine and olmesartan 20 mg rather than benazepril.  I initiated anticoagulation with Eliquis 5 mg twice a day.  He underwent a 2D echo Doppler study on August 16, 2018 which showed EF low normal at 50 to 55%.  There was evidence for significant LVH and moderate dilation of his left atrium.  I saw him for follow-up evaluation on August 23, 2018.  At that time he was now cognizant of his heart rate irregularity.  He has continued to use CPAP with excellent compliance.  A new download was obtained from July 5 through August 21, 2018 which shows 100% compliance with average usage 7 hours and 28 minutes.  AHI is 1.6 and his 95th percentile auto pressure is 10.1 with a maximum average pressure of 11.4.  He states he has lost weight over the past several weeks purposefully.  He continues to exercise now on a stationary bike.  He denies any chest pain PND orthopnea.  He was tolerating Eliquis without bleeding.  During that evaluation, we discussed different options including several additional  weeks of increased weight control versus initiating antiarrhythmic therapy.  Since his only other episode of atrial fibrillation previously occurred 13 years ago and he had not had any recurrence until this year he opted for an increased rate control trial.  As result metoprolol dose was further titrated.  I saw on September 15, 2018 for follow-up evaluation.  At that time remained in atrial fibrillation despite his increase metoprolol succinate to 100 mg in the morning and 50 mg at night with continuation of diltiazem 180 mg,  spironolactone 12.5 mg daily in addition to his olmesartan 20 mg.  I scheduled him for DC cardioversion but because of the need to obtain a COVID test I was unable to schedule this to be done by me prior to going on vacation.  He ultimately underwent the successful cardioversion by Clayton [name] successfully and received 1 shock of 200 J of biphasic synchronized rhythm with restoration of sinus rhythm.  Chemistry profile done prior to the cardioversion revealed his creatinine had increased to 1.69 and as result I recommended he reduce his olmesartan down to 10 mg from his dose of 20 mg.  I saw him on October 10, 2018 in follow-up of his cardioversion.  At that time he was maintaining sinus rhythm and feeling well.  He had more energy.  His stress level had significantly reduced since his wife had successful L5-S1 back surgery by Clayton [name].  He was continuing to use CPAP with 100% compliance.    I evaluated him on January 09, 2019.  At that time he stated that over the 3 months previous he had continued to feel well and remained asymptomatic.  At times there are still periods of increased stress.  He continued to use CPAP with 100% compliance.  A download was obtained from November 22 through January 08, 2019 which showed 100% compliance.  Average usage was 6 hours 47 minutes.  AHI is excellent at 1.4 with his AutoSet CPAP minimum pressure set at 8 and maximum of 20, with 95th percentile  pressure 10.2 with maximum average pressure 12.0.  During his evaluation, his ECG verify that he was back in atrial fibrillation.  At the time, he did not inform me that he had noted some irregularity but retrospectively this may have been going on for several weeks prior to that evaluation.  He was continuing to walk daily and exercising on his bike and was asymptomatic without shortness of breath.  During that evaluation I had a long discussion with him regarding possible EP evaluation for consideration of ablation.  After his significant discussion elected to initiate an attempt at antiarrhythmic therapy with low-dose flecainide initially at 50 mg twice a day with plans for office visit in 2 weeks.  He has continued to be on Eliquis for anticoagulation in addition to Zetia for his hyperlipidemia.  When I saw him on January 24, 2019 he stated that he had felt some fatigue once flecainide was instituted.  He was feeling well but this past Sunday he had been working very hard going up and down numerous steps carrying boxes with heavy exertion making at least 40 trips carrying Christmas girls back upstairs.  He was tired.  Sunday evening while sitting down he became disoriented anxious and it appeared that there was possibly some transient stress of aphasia.  He denied any focal weakness.  His symptoms ultimately resolved after 30 minutes.  He had called the office during the workweek and was concerned about his symptoms possibly secondary to flecainide.  He had self reduced his flecainide dose to just once a day rather than twice a day and is worked into my schedule today for follow-up evaluation.  Presently, he has no residual issues since that event several days ago.    At his January 2021 evaluation, I recommended he undergo a neurologic evaluation with Clayton [name] as well as discussed an EP evaluation for possible consideration of future atrial fibrillation ablation.  He saw Clayton [name] on February 08, 2019 and  was in  persistent atrial fibrillation.  Was discussion concerning continuing flecainide with subsequent high voltage cardioversion.  He saw Clayton [name] February 27, 2019 and after a long evaluation it was felt that his transient episode of word finding difficulty and anomia likely represented mild cognitive impairment rather than TIA or seizure.  He subsequently saw Dr Curt [name] back on March 05, 2019 and on 323 he underwent successful cardioversion with Clayton [name] which required 3 shocks with restoration of sinus rhythm.  He was evaluated by me in March 2021 at which time he felt his heart rhythm had remained stable.    He was under considerable increased stress to his wife had fallen in sustained a fracture to her humerus as well as tear to her rotator cuff and biceps tendon.  Ul diltiazem was discontinued and he was started on amlodipine 5 mg and was told to continue olmesartan 20 mg in addition to his metoprolol and spironolactone.  Timately she underwent surgery.  He denies chest pain PND orthopnea.  He has continued to use CPAP download from March 05, 2019 through April 03, 2019 continues to show excellent compliance with an AHI of 2.7 and 95th percentile pressure 11.4 cm.   Since I last saw him, he was evaluated by Clayton [name] June 20, 2019.  At that time, he was getting to stroke with his CPAP but seen for the first 10 minutes before he was able to settle down and fall asleep.  He denies chest pain or shortness of breath.  He had noted some elevated blood pressures as well as some dyspnea on exertion.  During that evaluation his diltiazem was discontinued and he was started on amlodipine 5 mg.  He was told to continue his present dose of olmesartan, metoprolol, spironolactone.  During that evaluation he was maintaining sinus rhythm but had bradycardia and first-degree heart block.  Flecainide was reduced down to 50 mg twice a day since his symptoms of fatigability and dyspnea seem to occur at the  higher dose.  With return to a lower dose his symptoms resolved.  I  evaluated him in June 2021 over the prior 3 months he denied any recurrent episodes of chest pain.    He continues to have issues in the first 10 minutes of his CPAP use which I suspect may be related to not having a ramp time on his machine which can easily be adjusted.  His blood pressure does continue to be somewhat labile but he states at home most of the time it is controlled.  He was unaware of any recurrent episodes of atrial fibrillation.  He denied presyncope or syncope.    He underwent a nuclear stress test which apparently had been ordered for dyspnea on exertion.  This was low risk study and demonstrated a hypertensive response to exercise.  A defect was felt to be present in the inferior wall which was interpreted as scar with mild peri-infarct ischemia, which may also have been exacerbated by his body habitus and diaphragmatic attenuation.  Currently he remains asymptomatic and denies chest pain or shortness of breath.  He has been evaluated by Clayton [name] for left cochlear Mnire's disease.  He underwent audiogram which showed mild to moderate severity high-frequency sensorineural neural hearing loss in the right ear and mild to moderately severe downsloping sensorineural neural hearing loss in the left ear.   I last saw him in October 2021 at which time he denied any recurrent episodes of atrial fibrillation.  He  continued  to be on amlodipine 5 mg, metoprolol XL 100 mg in the evening with 25 or 50 mg in the morning, olmesartan 40 mg, and spironolactone 12.5 mg daily.  He was on Eliquis for anticoagulation.  He wastolerating Livalo 2 mg and Zetia for hyperlipidemia with LDL cholesterol at 55 in June 2021.  He tries to play golf several times a week if possible and continues to score well.  He continues to be followed by Clayton [name] with plans for continued weight loss.  Since I last saw him, he has had knee  issues.  Only he had undergone removal of his meniscus.  He has been followed by Clayton [name] he recently had a hyaluronic acid injection with benefit.  He also has undergone evaluation with Dr. Ihor Gully with discomfort in his right hip.  He has low back issues at L5-S1 and is now on gabapentin.  He is unaware of recurrent atrial fibrillation.  Apparently he is now only taking flecainide 50 mg daily instead of twice a day.  He continues to be on amlodipine 7.5 mg, Eliquis 5 mg twice a day, metoprolol succinate 150 mg in the evening and spironolactone 12.5 mg daily.  Times he has some mild blood pressure elevation.  He continues to be evaluated at healthy weight and wellness.  Because of his knee and hip issues he has not been playing as much golf as he had in the past.  Continues to use CPAP with 100% compliance.  New download was obtained from January 20 through March 07, 2020.  He is averaging 7 hours and 2 minutes of CPAP use per night.  AHI is 1.5 with a 95th percentile pressure at 11.5 and maximum average pressure at 13.  He presents for reevaluation   Past Medical History:  Diagnosis Date  . Arthritis    hips   . Atrial fibrillation ([place])   . Cataract   . CPAP (continuous positive airway pressure) dependence   . Dysrhythmia    PAF- hx of 7 years ago   . HLD (hyperlipidemia)   . Hypertension 05/29/10   ECHO-EF 67% wnl  . Meniere disease   . Obesity   . Palpitations   . Sleep apnea 09/14/04   [facility]- Dr. Humphrey Rolls; CPAP titration 11/12/04- Dr. Humphrey Rolls  . Stroke [facility])     Past Surgical History:  Procedure Laterality Date  . CARDIAC CATHETERIZATION  11/03/05  . CARDIOVERSION N/A 09/26/2018   Procedure: CARDIOVERSION;  Surgeon: Geralynn Rile, MD;  Location: [facility];  Service: Endoscopy;  Laterality: N/A;  . CARDIOVERSION N/A 03/13/2019   Procedure: CARDIOVERSION;  Surgeon: Skeet Latch, MD;  Location: [place];  Service: Cardiovascular;  Laterality: N/A;  .  CATARACT EXTRACTION  2014  . KNEE ARTHROSCOPY     left knee- 2002   . TOTAL HIP ARTHROPLASTY  06/08/2011   Procedure: TOTAL HIP ARTHROPLASTY ANTERIOR APPROACH;  Surgeon: [name], MD;  Location: [facility];  Service: Orthopedics;  Laterality: Left;    Allergies  Allergen Reactions  . Rosuvastatin     Other reaction(s): GI Upset (intolerance) Crestor - Intestinal issues   . Statins Other (See Comments)    Crestor - Intestinal issues     Current Outpatient Medications  Medication Sig Dispense Refill  . Ascorbic Acid (VITAMIN C) 1000 MG tablet Take 1,000 mg by mouth daily.     Marland Kitchen aspirin EC 81 MG tablet Take 1 tablet (81 mg total) by mouth daily. 90 tablet  3  . [place]-Glucosamine-Vit D (GLUCOSAMINE COMPLEX PO) Take 3 tablets by mouth daily.     . Coenzyme Q10 200 MG capsule Take 200 mg by mouth daily.     . diazepam (VALIUM) 5 MG tablet One tab by mouth, 2 hours before procedure. 2 tablet 0  . ELIQUIS 5 MG TABS tablet TAKE 1 TABLET BY MOUTH TWICE A DAY 180 tablet 2  . ezetimibe (ZETIA) 10 MG tablet TAKE 1 TABLET BY MOUTH EVERY DAY 90 tablet 3  . flecainide (TAMBOCOR) 50 MG tablet Take 50 mg by mouth daily.    Marland Kitchen gabapentin (NEURONTIN) 100 MG capsule Take 2 capsules (200 mg total) by mouth at bedtime. 180 capsule 3  . LIVALO 2 MG TABS TAKE 1 TABLET BY MOUTH EVERY DAY 90 tablet 3  . metFORMIN (GLUCOPHAGE) 500 MG tablet Take 1 tablet (500 mg total) by mouth daily with breakfast. 90 tablet 0  . metoprolol succinate (TOPROL-XL) 100 MG 24 hr tablet TAKE 100 MG IN THE EVENINGS (ALONG WITH 25 MG OR 50 MG IN THE MORNINGS) 90 tablet 3  . Multiple Vitamin (MULITIVITAMIN WITH MINERALS) TABS Take 1 tablet by mouth daily.     Marland Kitchen olmesartan (BENICAR) 40 MG tablet Take 1 tablet (40 mg total) by mouth daily. 90 tablet 3  . spironolactone (ALDACTONE) 25 MG tablet TAKE 1/2 TABLET BY MOUTH EVERY DAY 45 tablet 3  . tamsulosin (FLOMAX) 0.4 MG CAPS capsule Take 0.4 mg by mouth daily.    Marland Kitchen triamcinolone  cream (KENALOG) 0.5 % APPLY 1 APPLICATION TOPICALLY 2 (TWO) TIMES DAILY. TO AFFECTED AREAS. 30 g 3  . zinc gluconate 50 MG tablet Take 50 mg by mouth daily.     Marland Kitchen amLODipine (NORVASC) 5 MG tablet Take 1.5 tablets (7.5 mg total) by mouth daily. 90 tablet 3   No current facility-administered medications for this visit.    Socially he is remarried after his first wife passed away. He has 2 children. He is retired  and was the Building control surveyor at [employer]. there is no tobacco history.  He plays golf at least 2 days/week and this past year not only did he have a hole in 1 but he has also shot his age.  ROS General: Negative; No fevers, chills, or night sweats;  HEENT: Negative; No changes in vision or hearing, sinus congestion, difficulty swallowing Pulmonary: Negative; No cough, wheezing, shortness of breath, hemoptysis Cardiovascular: See HPI GI: Negative; No nausea, vomiting, diarrhea, or abdominal pain GU: Negative; No dysuria, hematuria, or difficulty voiding Musculoskeletal: He is status post rib fracture.  He is status post hip replacement. Hematologic/Oncology: Negative; no easy bruising, bleeding Endocrine: Negative; no heat/cold intolerance; no diabetes Neuro: No vertigo or imbalance issues Skin: Negative; No rashes or skin lesions Psychiatric: Negative; No behavioral problems, depression Sleep: Positive for obstructive sleep apnea on CPAP therapy with 100% compliance No snoring, daytime sleepiness, hypersomnolence, bruxism, restless legs, hypnogognic hallucinations, no cataplexy Other comprehensive 14 point system review is negative.  PE BP (!) 160/90   Pulse 82   Ht 6' 3"  (1.905 m)   Wt 264 lb 6.4 oz (119.9 kg)   SpO2 97%   BMI 33.05 kg/m    Repeat blood pressure by me was 158/88  Wt Readings from Last 3 Encounters:  03/10/20 264 lb 6.4 oz (119.9 kg)  02/19/20 257 lb (116.6 kg)  01/24/20 257 lb (116.6 kg)  In 2021 he had lost approximately 40 pounds  General:  Alert, oriented, no distress.  Skin: normal turgor, no rashes, warm and dry HEENT: Normocephalic, atraumatic. Pupils equal round and reactive to light; sclera anicteric; extraocular muscles intact;  Nose without nasal septal hypertrophy Mouth/Parynx benign; Mallinpatti scale 3 Neck: No JVD, no carotid bruits; normal carotid upstroke Lungs: clear to ausculatation and percussion; no wheezing or rales Chest wall: without tenderness to palpitation Heart: PMI not displaced, RRR, s1 s2 normal, 1/6 systolic murmur, no diastolic murmur, no rubs, gallops, thrills, or heaves Abdomen: soft, nontender; no hepatosplenomehaly, BS+; abdominal aorta nontender and not dilated by palpation. Back: no CVA tenderness Pulses 2+ Musculoskeletal: Wearing a left knee brace Extremities: no clubbing cyanosis or edema, Homan's sign negative  Neurologic: grossly nonfocal; Cranial nerves grossly wnl Psychologic: Normal mood and affect   ECG (independently read by me): NSR at 82; RBBB with repoarization, QTc 497 msec  November 06, 2019 ECG (independently read by me): Sinus rhythm at 64; 1st degree AV block; PR 212 msec;RBBB  July 09, 2019 ECG (independently read by me): Normal sinus rhythm at 70 bpm, first-degree AV block with a.  PR interval at 212 ms.  Right bundle branch block with repolarization changes.  Borderline LVH by voltage criteria.  QTc interval 470 ms.  April 04, 2019 ECG (independently read by me): Sinus bradycardia 59 bpm, first-degree AV block with a PR of 248 ms.  LVH.  Inferior Q waves, unchanged  January 24, 2019 ECG (independently read by me): Atrial fibrillation at 83 bpm.  Borderline LVH by voltage criteria in aVL; QTc interval 446 ms.  January 09, 2019 ECG (independently read by me): Atrial fibrillation ar 97, RBBB; Left axis deviation; Inferior Q waves, unchanged. QTc 487    August 09, 2018 ECG (independently read by me): Normal sinus rhythm at 72 bpm.  Left Axis deviation.  QTc interval 429  ms.  September 15, 2018 ECG (independently read by me): Atrial fibrillation at 92 bpm.  Right bundle branch block with repolarization changes.  August 23, 2018 ECG (independently read by me): Atrial fibrillation at 96 bpm.  LVH with repolarization changes.  Left axis deviation.  Left anterior hemiblock.  Inferior Q waves  August 09, 2018 ECG (independently read by me):Atrial fibrillatoion at 45; LVH with repolarization  October 219 ECG (independently read by me): Normal sinus rhythm at 81 bpm.  Nonspecific intraventricular conduction delay.  Left axis deviation  September 2018 ECG (independently read by me): Normal sinus rhythm at 63 bpm.  Left bundle branch block with repolarization changes.  Normal intervals.  No ectopy.  April 2018 ECG (independently read by me): Normal sinus rhythm at 68 bpm.  LVH with repolarization.  Normal intervals.  November 2017 ECG (independently read by me): Normal sinus rhythm at 78 bpm.  Nonspecific interventricular delay.  QRS complex V1 V2.  August 2016 ECG (independently read by me): Normal sinus rhythm at 77 bpm.  LVH with repolarization changes.  Left axis deviation.  December 2015 ECG (independently read by me): Normal sinus rhythm at 87 bpm.  LVH by voltage criteria with T-wave changes in leads 1 and aVL.  July 2014 ECG: Normal sinus rhythm at 76 beats per minute. LVH with QRS widening.  LABS:  BMP Latest Ref Rng & Units 01/24/2020 01/24/2020 06/25/2019  Glucose 70 - 99 mg/dL - 109(H) 109(H)  BUN 6 - 23 mg/dL - 30(H) 30(H)  Creatinine 0.40 - 1.50 mg/dL - 1.40 1.28(H)  BUN/Creat Ratio 10 - 24 - - 23  Sodium 135 - 145 mEq/L - 139 139  Potassium 3.5 - 5.1 mEq/L - 4.3 5.0  Chloride 96 - 112 mEq/L - 106 103  CO2 19 - 32 mEq/L - 28 24  Calcium 8.6 - 10.3 mg/dL 10.2 10.1 9.6   Hepatic Function Latest Ref Rng & Units 01/24/2020 06/25/2019 01/09/2019  Total Protein 6.0 - 8.3 g/dL 7.3 6.5 6.6  Albumin 3.5 - 5.2 g/dL 4.5 4.5 4.5  AST 0 - 37 U/L 21 20 30   ALT 0 - 53  U/L 21 20 31   Alk Phosphatase 39 - 117 U/L 63 62 64  Total Bilirubin 0.2 - 1.2 mg/dL 0.6 0.8 0.8   CBC Latest Ref Rng & Units 01/24/2020 03/13/2019 03/05/2019  WBC 4.0 - 10.5 K/uL 3.9(L) - 4.8  Hemoglobin 13.0 - 17.0 g/dL 14.3 15.6 15.4  Hematocrit 39.0 - 52.0 % 42.6 46.0 45.3  Platelets 150.0 - 400.0 K/uL 221.0 - 168   Lab Results  Component Value Date   MCV 89.7 01/24/2020   MCV 90 03/05/2019   MCV 95 09/22/2018   Lab Results  Component Value Date   TSH 2.12 01/24/2020    Lab Results  Component Value Date   HGBA1C 5.8 (H) 08/01/2019    Lipid Panel     Component Value Date/Time   CHOL 124 06/25/2019 1014   TRIG 61 06/25/2019 1014   HDL 56 06/25/2019 1014   CHOLHDL 2.2 06/25/2019 1014   CHOLHDL 3.8 10/12/2016 0902   VLDL 17 04/14/2016 0937   LDLCALC 55 06/25/2019 1014   LDLCALC 119 (H) 10/12/2016 0902     RADIOLOGY: No results found.  IMPRESSION:  1. Essential hypertension   2. Paroxysmal atrial fibrillation (HCC)   3. OSA on CPAP   4. RBBB   5. Anticoagulated   6. Mixed hyperlipidemia   7. Mild obesity        ASSESSMENT AND PLAN: Mr Henard is a 75 year old gentleman who has a long-standing history of hypertension and remotely had been on amlodipine/benazepril 10/40, HCTZ 25 mg, Toprol-XL 100 mg in addition to spironolactone 12.5 mg daily.  He had had a prior isolated episode of atrial fibrillation in 2007 and had undergone cardioversion.  On this regimen his blood pressure had stabilized and consistently was well controlled and he had not had any recurrent episodes of atrial fibrillation.   He had gained significant amount of weight since August 2019.  He initiated a major lifestyle change and has been working closely with Clayton [name] from [facility] wellness group resulting in a greater than 40 pound weight loss in 2021.   When I saw him in late July 2020 he was in atrial fibrillation of questionable duration.  At the time retrospectively he believes he may  have been in A. fib for 3 to 4 months duration.  I gradually titrated his medical regimen and changed his benazepril to diltiazem and olmesartan to take in addition to metoprolol succinate and his spironolactone 12.5 mg daily.  He had continued to use CPAP with 100% compliance.  His only previous episode of A. fib prior to this episode had occurred in 2007 and he had maintained sinus rhythm until this past year.  He underwent successful cardioversion on September 26, 2018 with restoration of sinus rhythm.  When I saw him on January 09, 2019 he was continuing to have periods of increased stress ECG again showed recurrent atrial fibrillation of questionable duration.  Retrospectively he felt he may have been in atrial fibrillation for 3 to 4 weeks  duration prior to that visit.  He also had an episode of disorientation with possible transient expressive aphasia and underwent neurologic evaluation with Clayton [name] and also EP evaluation with Clayton [name].  He was felt that his word finding difficulty and anomia most likely represented mild cognitive impairment rather than a TIA or stroke.  He had been on increased dose of flecainide up to 100 mg twice a day after his subsequent cardioversion.  When I last saw him he was on flecainide 50 mg twice a day.  His last echo Doppler study in January 2021 showed an EF of 50 to 55% without regional wall motion abnormalities.  Currently he is asymptomatic without chest pain or shortness of breath.  When I last saw him blood pressure was elevated and I further titrated amlodipine to 7.5 mg.  Blood pressure in the office is elevated today despite taking amlodipine 7.5 mg, metoprolol succinate 100 mg daily, olmesartan 40 mg in addition to spironolactone 12.5 mg daily.  Presently, he is maintaining sinus rhythm ECG shows sinus rhythm at 82 bpm with right bundle branch block.  QTc interval is prolonged at 497 ms.  For this reason, I will not increase his flecainide back to his prior dose  but have recommended further titration of metoprolol and he will now take 50 mg in the morning and continue 100 mg at night.  He continues to be on Livalo 2 mg daily in addition to Zetia for hyperlipidemia.  There is no edema.  I reviewed his most recent CPAP titration.  His CPAP is set at a minimum pressure of 8 up to 20 cm.  AHI is excellent at 1.5 with 95th percentile pressure at 11.5 and maximum average pressure 13.  He is followed by Dr. Hulan Saas and also is being evaluated by Dr. Adriana Mccallum for his orthopedic issues of knee and hip.  He continues to have mild obesity with BMI 33.05 and is working at [employer] and wellness for additional weight loss.  I will see him in 4 months for reevaluation or sooner as needed.  [name], MD, [facility]  03/12/2020 1:26 PM

## 2020-03-10 NOTE — Patient Instructions (Signed)
Medication Instructions:  INCREASE YOUR METOPROLOL TO 1/4 TABLET IN THE MORNING AND KEEP AT 100 MG IN EVENING   IF BLOOD PRESSURE AND HEART RATE REMAIN ELEVATED OK TO INCREASE TO 1/2 TABLET IN AM   *If you need a refill on your cardiac medications before your next appointment, please call your pharmacy*  Lab Work: NONE   Testing/Procedures: NONE   Follow-Up: At [facility], you and your health needs are our priority.  As part of our continuing mission to provide you with exceptional heart care, we have created designated Provider Care Teams.  These Care Teams include your primary Cardiologist (physician) and Advanced Practice Providers (APPs -  Physician Assistants and Nurse Practitioners) who all work together to provide you with the care you need, when you need it.  We recommend signing up for the patient portal called "MyChart".  Sign up information is provided on this After Visit Summary.  MyChart is used to connect with patients for Virtual Visits (Telemedicine).  Patients are able to view lab/test results, encounter notes, upcoming appointments, etc.  Non-urgent messages can be sent to your provider as well.   To learn more about what you can do with MyChart, go to [email or web address].    Your next appointment:   4 MONTHS   The format for your next appointment:   In Person  Provider:   You may see Shelva Majestic, MD   or one of the following Advanced Practice Providers on your designated Care Team:    Almyra Deforest, PA-C  Fabian Sharp, PA-C or   Roby Lofts, [place]

## 2020-03-12 ENCOUNTER — Encounter: Payer: Self-pay | Admitting: Cardiovascular Disease

## 2020-03-12 DIAGNOSIS — M25551 Pain in right hip: Secondary | ICD-10-CM | POA: Diagnosis not present

## 2020-03-20 NOTE — Progress Notes (Signed)
[name] [address] [place] Phone: [phone] Subjective:   I Clayton [name] am serving as a Education administrator for Dr. Hulan Saas.  This visit occurred during the SARS-CoV-2 public health emergency.  Safety protocols were in place, including screening questions prior to the visit, additional usage of staff PPE, and extensive cleaning of exam room while observing appropriate contact time as indicated for disinfecting solutions.   I'm seeing this patient by the request  of:  [name], Clayton A, DO  CC: Low back pain and hip pain follow-up  JOA:[id number]   02/19/2020 Patient does have multilevel advanced degenerative changes causing some foraminal stenosis but seems to be left greater than right especially of the left S1 nerve root.  Patient symptoms do not fit this and it seems to be more on the right side.  Because of this I do feel that most of the pain is secondary to the right hip pain.  If worsening pain we would consider an epidural but patient is on Eliquis and would need approval from cardiology to discontinue medication for short-term we can do that but have patient follow-up with orthopedic to discuss surgical intervention.  I believe the patient's pain seems to be more secondary to the right hip.  Patient does have more of the arthritic changes and I think that is likely surgical intervention is more necessary.  Patient's MRI of the lumbar spine does show that patient does have some advanced arthritic changes but nerve root impingement seems to be left-sided more than right side.  We discussed different treatment options such as an epidural of the back to discuss prior to surgery but at this point I do feel pretty strongly that the hip likely needs replacement.  Patient has been referred to orthopedic surgery to discuss.  Update 03/21/2020 Clayton [name] is a 75 y.o. male coming in with complaint of low back and R hip pain. Seen by Dr. [name].  Patient states the back is fine. Since seeing [name] 3 days ago the hip is doing well. Walked a mile this morning. States there is a big difference but the pain is not gone.  Patient states that the back pain seems to be doing relatively well overall.  Patient does have some mild increase in tightness since he started the walking again.  No significant radicular pain.  Left knee seems to be doing well      Past Medical History:  Diagnosis Date  . Arthritis    hips   . Atrial fibrillation ([place])   . Cataract   . CPAP (continuous positive airway pressure) dependence   . Dysrhythmia    PAF- hx of 7 years ago   . HLD (hyperlipidemia)   . Hypertension 05/29/10   ECHO-EF 67% wnl  . Meniere disease   . Obesity   . Palpitations   . Sleep apnea 09/14/04   [facility]- Dr. Humphrey Rolls; CPAP titration 11/12/04- Dr. Humphrey Rolls  . Stroke [facility])    Past Surgical History:  Procedure Laterality Date  . CARDIAC CATHETERIZATION  11/03/05  . CARDIOVERSION N/A 09/26/2018   Procedure: CARDIOVERSION;  Surgeon: Geralynn Rile, MD;  Location: [facility];  Service: Endoscopy;  Laterality: N/A;  . CARDIOVERSION N/A 03/13/2019   Procedure: CARDIOVERSION;  Surgeon: Skeet Latch, MD;  Location: [facility];  Service: Cardiovascular;  Laterality: N/A;  . CATARACT EXTRACTION  2014  . KNEE ARTHROSCOPY     left knee- 2002   .  TOTAL HIP ARTHROPLASTY  06/08/2011   Procedure: TOTAL HIP ARTHROPLASTY ANTERIOR APPROACH;  Surgeon: [name], MD;  Location: [facility];  Service: Orthopedics;  Laterality: Left;   Social History   Socioeconomic History  . Marital status: Married    Spouse name: Glenville Espina  . Number of children: Not on file  . Years of education: Not on file  . Highest education level: Not on file  Occupational History  . Occupation: Retired  Tobacco Use  . Smoking status: Never Smoker  . Smokeless tobacco: Never Used  Vaping Use  . Vaping Use: Never used  Substance and Sexual  Activity  . Alcohol use: No  . Drug use: No  . Sexual activity: Yes    Birth control/protection: None  Other Topics Concern  . Not on file  Social History Narrative   Married. Clayton [name].Takes care of his mother-in-law as well.    Retired, Conservator, museum/gallery.    Drinks caffeine beverages. Takes a daily vitamin.   Exercises routinely.    Smoke detector in the home.    Ambulates independently.    Social Determinants of Health   Financial Resource Strain: Not on file  Food Insecurity: Not on file  Transportation Needs: Not on file  Physical Activity: Not on file  Stress: Not on file  Social Connections: Not on file   Allergies  Allergen Reactions  . Rosuvastatin     Other reaction(s): GI Upset (intolerance) Crestor - Intestinal issues   . Statins Other (See Comments)    Crestor - Intestinal issues    Family History  Problem Relation Age of Onset  . Hypertension Mother   . Stroke Mother   . Diabetes Mother   . Hyperlipidemia Mother   . Obesity Mother   . Diabetes Sister   . Stroke Maternal Grandmother        3 died   . Heart disease Maternal Grandfather   . Hypertension Maternal Grandfather   . Diabetes Maternal Grandfather   . Arthritis Father   . Heart disease Father   . Heart disease Paternal Grandfather     Current Outpatient Medications (Endocrine & Metabolic):  .  metFORMIN (GLUCOPHAGE) 500 MG tablet, Take 1 tablet (500 mg total) by mouth daily with breakfast.  Current Outpatient Medications (Cardiovascular):  .  ezetimibe (ZETIA) 10 MG tablet, TAKE 1 TABLET BY MOUTH EVERY DAY .  flecainide (TAMBOCOR) 50 MG tablet, Take 50 mg by mouth daily. Marland Kitchen  LIVALO 2 MG TABS, TAKE 1 TABLET BY MOUTH EVERY DAY .  metoprolol succinate (TOPROL-XL) 100 MG 24 hr tablet, TAKE 100 MG IN THE EVENINGS (ALONG WITH 25 MG OR 50 MG IN THE MORNINGS) .  olmesartan (BENICAR) 40 MG tablet, Take 1 tablet (40 mg total) by mouth daily. Marland Kitchen  spironolactone (ALDACTONE) 25 MG tablet, TAKE 1/2  TABLET BY MOUTH EVERY DAY .  amLODipine (NORVASC) 5 MG tablet, Take 1.5 tablets (7.5 mg total) by mouth daily.   Current Outpatient Medications (Analgesics):  .  aspirin EC 81 MG tablet, Take 1 tablet (81 mg total) by mouth daily.  Current Outpatient Medications (Hematological):  Marland Kitchen  ELIQUIS 5 MG TABS tablet, TAKE 1 TABLET BY MOUTH TWICE A DAY  Current Outpatient Medications (Other):  Marland Kitchen  Ascorbic Acid (VITAMIN C) 1000 MG tablet, Take 1,000 mg by mouth daily.  .  Boswellia-Glucosamine-Vit D (GLUCOSAMINE COMPLEX PO), Take 3 tablets by mouth daily.  .  Coenzyme Q10 200 MG capsule, Take 200 mg by mouth  daily.  .  diazepam (VALIUM) 5 MG tablet, One tab by mouth, 2 hours before procedure. .  gabapentin (NEURONTIN) 100 MG capsule, Take 2 capsules (200 mg total) by mouth at bedtime. .  Multiple Vitamin (MULITIVITAMIN WITH MINERALS) TABS, Take 1 tablet by mouth daily.  .  tamsulosin (FLOMAX) 0.4 MG CAPS capsule, Take 0.4 mg by mouth daily. Marland Kitchen  triamcinolone cream (KENALOG) 0.5 %, APPLY 1 APPLICATION TOPICALLY 2 (TWO) TIMES DAILY. TO AFFECTED AREAS. Marland Kitchen  zinc gluconate 50 MG tablet, Take 50 mg by mouth daily.    Reviewed prior external information including notes and imaging from  primary care provider As well as notes that were available from care everywhere and other healthcare systems.  Past medical history, social, surgical and family history all reviewed in electronic medical record.  No pertanent information unless stated regarding to the chief complaint.   Review of Systems:  No headache, visual changes, nausea, vomiting, diarrhea, constipation, dizziness, abdominal pain, skin rash, fevers, chills, night sweats, weight loss, swollen lymph nodes, body aches, joint swelling, chest pain, shortness of breath, mood changes. POSITIVE muscle aches  Objective  Blood pressure 124/82, pulse 97, height 6\' 3"  (1.905 m), weight 260 lb (117.9 kg), SpO2 (!) 61 %.   General: No apparent distress alert and  oriented x3 mood and affect normal, dressed appropriately.  HEENT: Pupils equal, extraocular movements intact  Respiratory: Patient's speak in full sentences and does not appear short of breath  Cardiovascular: No lower extremity edema, non tender, no erythema  Gait very mild antalgic gait Still limited range of motion of the right hip lacking 15 degrees of internal rotation Negative straight leg test.  Tightness noted in the paraspinal musculature lumbar spine right greater than left.  Osteopathic findings  T6 extended rotated and side bent left L4 flexed rotated and side bent right Sacrum right on right     Impression and Recommendations:     The above documentation has been reviewed and is accurate and complete [name], DO

## 2020-03-21 ENCOUNTER — Encounter: Payer: Self-pay | Admitting: Family Medicine

## 2020-03-21 ENCOUNTER — Ambulatory Visit: Payer: Medicare PPO | Admitting: Family Medicine

## 2020-03-21 ENCOUNTER — Other Ambulatory Visit: Payer: Self-pay

## 2020-03-21 VITALS — BP 124/82 | HR 97 | Ht 75.0 in | Wt 260.0 lb

## 2020-03-21 DIAGNOSIS — M1712 Unilateral primary osteoarthritis, left knee: Secondary | ICD-10-CM | POA: Diagnosis not present

## 2020-03-21 DIAGNOSIS — M999 Biomechanical lesion, unspecified: Secondary | ICD-10-CM

## 2020-03-21 DIAGNOSIS — M1611 Unilateral primary osteoarthritis, right hip: Secondary | ICD-10-CM

## 2020-03-21 NOTE — Assessment & Plan Note (Signed)
Known arthritic changes but doing relatively well.  He uses the brace when he does a lot of activity.

## 2020-03-21 NOTE — Patient Instructions (Addendum)
Good to see you You can see me before Clayton [name] luck with driving range See me again in 6 weeks

## 2020-03-21 NOTE — Assessment & Plan Note (Signed)
Patient did respond well to an intra-articular injection from an outside physician.  Patient is wondering if she is able to delay surgery at this time.  We do note that most of the pain in the leg though is secondary to the hip arthritis more than the back with patient's response.

## 2020-03-21 NOTE — Assessment & Plan Note (Signed)

## 2020-04-09 NOTE — Progress Notes (Signed)
[name] Phone: [phone] Subjective:    I Clayton [name] am serving as a Education administrator for Dr. Hulan Saas.  This visit occurred during the SARS-CoV-2 public health emergency.  Safety protocols were in place, including screening questions prior to the visit, additional usage of staff PPE, and extensive cleaning of exam room while observing appropriate contact time as indicated for disinfecting solutions.   I'm seeing this patient by the request  of:  Kuneff, Renee A, DO  CC: Knee pain  [id number]  Clayton [name] is a 75 y.o. male coming in with complaint of back and neck pain. OMT 03/21/2020. Patient states he has right hip and left knee pain today. Saw Dr. Charm Rings. Believes it is the sciatic issue on the right side. Had an injection in the right hip. Injection resolved some of the issues over the first 48 hours. States it has gotten better every day since then. Sees Dr. Charm Rings again on Monday. For the left knee he wants to talk about an injection.   Medications patient has been prescribed: None  Taking:         Reviewed prior external information including notes and imaging from previsou exam, outside providers and external EMR if available.   As well as notes that were available from care everywhere and other healthcare systems.  Past medical history, social, surgical and family history all reviewed in electronic medical record.  No pertanent information unless stated regarding to the chief complaint.   Past Medical History:  Diagnosis Date   Arthritis    hips    Atrial fibrillation (HCC)    Cataract    CPAP (continuous positive airway pressure) dependence    Dysrhythmia    PAF- hx of 7 years ago    HLD (hyperlipidemia)    Hypertension 05/29/10   ECHO-EF 67% wnl   Meniere disease    Obesity    Palpitations    Sleep apnea 09/14/04   [facility]- Dr. Humphrey Rolls; CPAP titration  11/12/04- Dr. Humphrey Rolls   Stroke [facility])     Allergies  Allergen Reactions   Rosuvastatin     Other reaction(s): GI Upset (intolerance) Crestor - Intestinal issues    Statins Other (See Comments)    Crestor - Intestinal issues      Review of Systems:  No headache, visual changes, nausea, vomiting, diarrhea, constipation, dizziness, abdominal pain, skin rash, fevers, chills, night sweats, weight loss, swollen lymph nodes, body aches, joint swelling, chest pain, shortness of breath, mood changes. POSITIVE muscle aches  Objective  Blood pressure 140/82, pulse 75, height 6\' 3"  (1.905 m), weight 259 lb (117.5 kg), SpO2 98 %.   General: No apparent distress alert and oriented x3 mood and affect normal, dressed appropriately.  HEENT: Pupils equal, extraocular movements intact  Respiratory: Patient's speak in full sentences and does not appear short of breath  Cardiovascular: No lower extremity edema, non tender, no erythema  Gait antalgic favoring the right hip MSK: Patient does have arthritic changes of the left knee noted.  Patient does have effusion noted.  Seems to be fairly severe at this moment.  Lacks last 5 degrees of flexion of the knee.  Continued mild instability noted.  After informed written and verbal consent, patient was seated on exam table. Left knee was prepped with alcohol swab and utilizing anterolateral approach, patient's left knee space was injected with 4:1  marcaine 0.5%: Kenalog 40mg /dL. Patient tolerated the  procedure well without immediate complications.        Assessment and Plan:        The above documentation has been reviewed and is accurate and complete [name], DO       Note: This dictation was prepared with Dragon dictation along with smaller phrase technology. Any transcriptional errors that result from this process are unintentional.

## 2020-04-10 ENCOUNTER — Encounter: Payer: Self-pay | Admitting: Family Medicine

## 2020-04-10 ENCOUNTER — Ambulatory Visit: Payer: Medicare PPO | Admitting: Family Medicine

## 2020-04-10 ENCOUNTER — Other Ambulatory Visit: Payer: Self-pay

## 2020-04-10 DIAGNOSIS — M1712 Unilateral primary osteoarthritis, left knee: Secondary | ICD-10-CM | POA: Diagnosis not present

## 2020-04-10 DIAGNOSIS — G8929 Other chronic pain: Secondary | ICD-10-CM

## 2020-04-10 DIAGNOSIS — M545 Low back pain, unspecified: Secondary | ICD-10-CM

## 2020-04-10 DIAGNOSIS — M1611 Unilateral primary osteoarthritis, right hip: Secondary | ICD-10-CM | POA: Diagnosis not present

## 2020-04-10 NOTE — Assessment & Plan Note (Signed)
Patient is following up with orthopedic surgery.  Will discuss with them about the timing of the replacement.  Patient did respond fairly well to the injection.

## 2020-04-10 NOTE — Assessment & Plan Note (Signed)
Patient does have spinal stenosis but is doing relatively well at this moment.  Discussed icing regimen and home exercises.  Increase activity slowly.  Patient is responding to the gabapentin and continue the same dose

## 2020-04-10 NOTE — Patient Instructions (Addendum)
Good to see you Knee injection today Make an appointment with Primary Care up front Talk to Olan about the hip See me again in 6 weeks

## 2020-04-10 NOTE — Assessment & Plan Note (Signed)
Patient had swelling today.  Patient is doing very well 7 months after viscosupplementation but is starting to have increasing the amount of swelling again.  At this point we will start icing regimen, given steroid injection, can repeat the viscosupplementation hopefully it will get approval.  Follow-up with me again as scheduled.

## 2020-04-14 DIAGNOSIS — M1611 Unilateral primary osteoarthritis, right hip: Secondary | ICD-10-CM | POA: Diagnosis not present

## 2020-04-18 ENCOUNTER — Other Ambulatory Visit (INDEPENDENT_AMBULATORY_CARE_PROVIDER_SITE_OTHER): Payer: Self-pay | Admitting: Family Medicine

## 2020-04-18 DIAGNOSIS — R7303 Prediabetes: Secondary | ICD-10-CM

## 2020-04-21 ENCOUNTER — Ambulatory Visit (INDEPENDENT_AMBULATORY_CARE_PROVIDER_SITE_OTHER): Payer: Medicare PPO | Admitting: Family Medicine

## 2020-04-22 ENCOUNTER — Other Ambulatory Visit (INDEPENDENT_AMBULATORY_CARE_PROVIDER_SITE_OTHER): Payer: Self-pay | Admitting: Adult Health

## 2020-04-22 DIAGNOSIS — R7303 Prediabetes: Secondary | ICD-10-CM

## 2020-04-22 MED ORDER — METFORMIN HCL 500 MG PO TABS: 500.0000 mg | ORAL_TABLET | Freq: Every day | ORAL | 0 refills | Status: DC

## 2020-04-22 NOTE — Telephone Encounter (Signed)
Please advise in Dr. Migdalia Dk absence.

## 2020-04-29 DIAGNOSIS — L821 Other seborrheic keratosis: Secondary | ICD-10-CM | POA: Diagnosis not present

## 2020-04-29 DIAGNOSIS — L814 Other melanin hyperpigmentation: Secondary | ICD-10-CM | POA: Diagnosis not present

## 2020-04-29 DIAGNOSIS — D1801 Hemangioma of skin and subcutaneous tissue: Secondary | ICD-10-CM | POA: Diagnosis not present

## 2020-04-29 DIAGNOSIS — D225 Melanocytic nevi of trunk: Secondary | ICD-10-CM | POA: Diagnosis not present

## 2020-05-01 NOTE — Progress Notes (Signed)
[place] [address] [place] Phone: [phone] Subjective:   I Clayton [name] am serving as [name] Education administrator for Dr. Hulan [name].  This visit occurred during the SARS-CoV-2 public health emergency.  Safety protocols were in place, including screening questions prior to the visit, additional usage of staff PPE, and extensive cleaning of exam room while observing appropriate contact time as indicated for disinfecting solutions.   I'm seeing this patient by the request  of:  Kuneff, Renee A, DO  CC: Left knee and back pain  [id number]   04/10/2020 Patient is following up with orthopedic surgery.  Will discuss with them about the timing of the replacement.  Patient did respond fairly well to the injection.  Patient had swelling today.  Patient is doing very well 7 months after viscosupplementation but is starting to have increasing the amount of swelling again.  At this point we will start icing regimen, given steroid injection, can repeat the viscosupplementation hopefully it will get approval.  Follow-up with me again as scheduled.  Patient does have spinal stenosis but is doing relatively well at this moment.  Discussed icing regimen and home exercises.  Increase activity slowly.  Patient is responding to the gabapentin and continue the same dose  Update 05/06/2020 Clayton [name] is [name] 75 y.o. male coming in with complaint of Hip, back and L knee pain. Monovisc approved for L knee. Patient states his hip is doing terrible. Has an appointment with surgeon on Thursday. Injection helped for about 5-6 weeks. Went golfing and the next day he had issues. Using [name] cane for support.       Past Medical History:  Diagnosis Date  . Arthritis    hips   . Atrial fibrillation ([place])   . Cataract   . CPAP (continuous positive airway pressure) dependence   . Dysrhythmia    PAF- hx of 7 years ago   . HLD (hyperlipidemia)   . Hypertension 05/29/10    ECHO-EF 67% wnl  . Meniere disease   . Obesity   . Palpitations   . Sleep apnea 09/14/04   [facility]- Clayton [name]; CPAP titration 11/12/04- Clayton [name]  . Stroke [facility])    Past Surgical History:  Procedure Laterality Date  . CARDIAC CATHETERIZATION  11/03/05  . CARDIOVERSION N/[name] 09/26/2018   Procedure: CARDIOVERSION;  Surgeon: Clayton Rile, MD;  Location: [place];  Service: Endoscopy;  Laterality: N/[name];  . CARDIOVERSION N/[name] 03/13/2019   Procedure: CARDIOVERSION;  Surgeon: Clayton Latch, MD;  Location: [place];  Service: Cardiovascular;  Laterality: N/[name];  . CATARACT EXTRACTION  2014  . KNEE ARTHROSCOPY     left knee- 2002   . TOTAL HIP ARTHROPLASTY  06/08/2011   Procedure: TOTAL HIP ARTHROPLASTY ANTERIOR APPROACH;  Surgeon: Clayton Pole, MD;  Location: [facility];  Service: Orthopedics;  Laterality: Left;   Social History   Socioeconomic History  . Marital status: Married    Spouse name: Clayton [name]  . Number of children: Not on file  . Years of education: Not on file  . Highest education level: Not on file  Occupational History  . Occupation: Retired  Tobacco Use  . Smoking status: Never Smoker  . Smokeless tobacco: Never Used  Vaping Use  . Vaping Use: Never used  Substance and Sexual Activity  . Alcohol use: No  . Drug use: No  . Sexual activity: Yes    Birth control/protection: None  Other  Topics Concern  . Not on file  Social History Narrative   Married. Clayton [name].Takes care of his mother-in-law as well.    Retired, Conservator, museum/gallery.    Drinks caffeine beverages. Takes [name] daily vitamin.   Exercises routinely.    Smoke detector in the home.    Ambulates independently.    Social Determinants of Health   Financial Resource Strain: Not on file  Food Insecurity: Not on file  Transportation Needs: Not on file  Physical Activity: Not on file  Stress: Not on file  Social Connections: Not on file   Allergies  Allergen Reactions  .  Rosuvastatin     Other reaction(s): GI Upset (intolerance) Crestor - Intestinal issues   . Statins Other (See Comments)    Crestor - Intestinal issues    Family History  Problem Relation Age of Onset  . Hypertension Mother   . Stroke Mother   . Diabetes Mother   . Hyperlipidemia Mother   . Obesity Mother   . Diabetes Sister   . Stroke Maternal Grandmother        65 died   . Heart disease Maternal Grandfather   . Hypertension Maternal Grandfather   . Diabetes Maternal Grandfather   . Arthritis Father   . Heart disease Father   . Heart disease Paternal Grandfather     Current Outpatient Medications (Endocrine & Metabolic):  .  metFORMIN (GLUCOPHAGE) 500 MG tablet, Take 1 tablet (500 mg total) by mouth daily with breakfast.  Current Outpatient Medications (Cardiovascular):  .  ezetimibe (ZETIA) 10 MG tablet, TAKE 1 TABLET BY MOUTH EVERY DAY .  flecainide (TAMBOCOR) 50 MG tablet, Take 50 mg by mouth daily. Marland Kitchen  LIVALO 2 MG TABS, TAKE 1 TABLET BY MOUTH EVERY DAY .  metoprolol succinate (TOPROL-XL) 100 MG 24 hr tablet, TAKE 100 MG IN THE EVENINGS (ALONG WITH 25 MG OR 50 MG IN THE MORNINGS) .  olmesartan (BENICAR) 40 MG tablet, Take 1 tablet (40 mg total) by mouth daily. Marland Kitchen  spironolactone (ALDACTONE) 25 MG tablet, TAKE 1/2 TABLET BY MOUTH EVERY DAY   Current Outpatient Medications (Analgesics):  .  aspirin EC 81 MG tablet, Take 1 tablet (81 mg total) by mouth daily.  Current Outpatient Medications (Hematological):  Marland Kitchen  ELIQUIS 5 MG TABS tablet, TAKE 1 TABLET BY MOUTH TWICE [name] DAY  Current Outpatient Medications (Other):  Marland Kitchen  Ascorbic Acid (VITAMIN C) 1000 MG tablet, Take 1,000 mg by mouth daily.  .  Boswellia-Glucosamine-Vit D (GLUCOSAMINE COMPLEX PO), Take 3 tablets by mouth daily.  .  Coenzyme Q10 200 MG capsule, Take 200 mg by mouth daily.  .  diazepam (VALIUM) 5 MG tablet, One tab by mouth, 2 hours before procedure. .  gabapentin (NEURONTIN) 100 MG capsule, Take 2 capsules (200  mg total) by mouth at bedtime. .  Multiple Vitamin (MULITIVITAMIN WITH MINERALS) TABS, Take 1 tablet by mouth daily.  .  tamsulosin (FLOMAX) 0.4 MG CAPS capsule, Take 0.4 mg by mouth daily. Marland Kitchen  triamcinolone cream (KENALOG) 0.5 %, APPLY 1 APPLICATION TOPICALLY 2 (TWO) TIMES DAILY. TO AFFECTED AREAS. Marland Kitchen  zinc gluconate 50 MG tablet, Take 50 mg by mouth daily.    Reviewed prior external information including notes and imaging from  primary care provider As well as notes that were available from care everywhere and other healthcare systems.  Past medical history, social, surgical and family history all reviewed in electronic medical record.  No pertanent information unless stated regarding to the  chief complaint.   Review of Systems:  No headache, visual changes, nausea, vomiting, diarrhea, constipation, dizziness, abdominal pain, skin rash, fevers, chills, night sweats, weight loss, swollen lymph nodes, body aches, joint swelling, chest pain, shortness of breath, mood changes. POSITIVE muscle aches  Objective  There were no vitals taken for this visit.   General: No apparent distress alert and oriented x3 mood and affect normal, dressed appropriately.  HEENT: Pupils equal, extraocular movements intact  Respiratory: Patient's speak in full sentences and does not appear short of breath  Cardiovascular: No lower extremity edema, non tender, no erythema  Gait normal with good balance and coordination.  MSK:  Knee: Left valgus deformity noted.  Abnormal thigh to calf ratio.  Tender to palpation over medial and PF joint line.  ROM full in flexion and extension and lower leg rotation. instability with valgus force.  painful patellar compression. Patellar glide with moderate crepitus. Patellar and quadriceps tendons unremarkable. Hamstring and quadriceps strength is normal. Contralateral knee shows mild arthritic changes  Right hip still has significant decrease in internal and external range  of motion noted.  Back exam loss of lordosis noted.  Tenderness to palpation of the paraspinal musculature mostly around L5 and S1.  Tightness with FABER test bilaterally.  Tightness with straight leg test.   Osteopathic findings T7 extended rotated and side bent right L1 flexed rotated and side bent right Sacrum right and right  After informed written and verbal consent, patient was seated on exam table. Left knee was prepped with alcohol swab and utilizing anterolateral approach, patient's left knee space was injected with 40 mg per 3 mL of Monovisc (sodium hyaluronate) in [name] prefilled syringe was injected easily into the knee through [name] 22-gauge needle..Patient tolerated the procedure well without immediate complications.   Impression and Recommendations:     The above documentation has been reviewed and is accurate and complete [name], DO

## 2020-05-05 ENCOUNTER — Encounter (INDEPENDENT_AMBULATORY_CARE_PROVIDER_SITE_OTHER): Payer: Self-pay | Admitting: Family Medicine

## 2020-05-05 ENCOUNTER — Other Ambulatory Visit: Payer: Self-pay

## 2020-05-05 ENCOUNTER — Ambulatory Visit (INDEPENDENT_AMBULATORY_CARE_PROVIDER_SITE_OTHER): Payer: Medicare PPO | Admitting: Family Medicine

## 2020-05-05 VITALS — BP 165/84 | HR 110 | Temp 97.9°F | Ht 75.0 in | Wt 252.0 lb

## 2020-05-05 DIAGNOSIS — Z6835 Body mass index (BMI) 35.0-35.9, adult: Secondary | ICD-10-CM

## 2020-05-05 DIAGNOSIS — I10 Essential (primary) hypertension: Secondary | ICD-10-CM

## 2020-05-06 ENCOUNTER — Other Ambulatory Visit: Payer: Self-pay

## 2020-05-06 ENCOUNTER — Ambulatory Visit: Payer: Medicare PPO | Admitting: Family Medicine

## 2020-05-06 ENCOUNTER — Encounter: Payer: Self-pay | Admitting: Family Medicine

## 2020-05-06 VITALS — BP 138/90 | HR 73 | Ht 75.0 in | Wt 259.0 lb

## 2020-05-06 DIAGNOSIS — M545 Low back pain, unspecified: Secondary | ICD-10-CM | POA: Diagnosis not present

## 2020-05-06 DIAGNOSIS — M9904 Segmental and somatic dysfunction of sacral region: Secondary | ICD-10-CM | POA: Diagnosis not present

## 2020-05-06 DIAGNOSIS — M1712 Unilateral primary osteoarthritis, left knee: Secondary | ICD-10-CM | POA: Diagnosis not present

## 2020-05-06 DIAGNOSIS — G8929 Other chronic pain: Secondary | ICD-10-CM

## 2020-05-06 DIAGNOSIS — M9902 Segmental and somatic dysfunction of thoracic region: Secondary | ICD-10-CM | POA: Diagnosis not present

## 2020-05-06 DIAGNOSIS — M9903 Segmental and somatic dysfunction of lumbar region: Secondary | ICD-10-CM

## 2020-05-06 DIAGNOSIS — M1611 Unilateral primary osteoarthritis, right hip: Secondary | ICD-10-CM

## 2020-05-06 DIAGNOSIS — M999 Biomechanical lesion, unspecified: Secondary | ICD-10-CM

## 2020-05-06 NOTE — Assessment & Plan Note (Signed)

## 2020-05-06 NOTE — Patient Instructions (Addendum)
Good to see you Gel injection today The Power Of Now See your Doctor about the hip See me again in 6 weeks

## 2020-05-06 NOTE — Assessment & Plan Note (Signed)
Seen in orthopedic surgeon to discuss surgical intervention versus 1 more injection

## 2020-05-06 NOTE — Assessment & Plan Note (Signed)
Chronic problem.  Has responded well to osteopathic manipulation.  Patient does have significant arthritic changes of both the right hip and the left knee that likely is giving him some more back pain as well.  We discussed with patient to continue potentially the gabapentin on a regular basis which has been helping his radicular symptoms.  I believe that probably a replacement of the right hip would be beneficial for him.  Patient will be seeing a orthopedic surgeon on Thursday to discuss further.  Follow-up with me again in 6 weeks

## 2020-05-06 NOTE — Assessment & Plan Note (Signed)
Repeat injection given today, tolerated procedure well, discussed icing regimen and home exercises.  Continue the early stability brace with a lot of activity.  Follow-up with me again 6 weeks

## 2020-05-08 DIAGNOSIS — M7061 Trochanteric bursitis, right hip: Secondary | ICD-10-CM | POA: Diagnosis not present

## 2020-05-08 DIAGNOSIS — M1611 Unilateral primary osteoarthritis, right hip: Secondary | ICD-10-CM | POA: Diagnosis not present

## 2020-05-11 ENCOUNTER — Encounter: Payer: Self-pay | Admitting: Family Medicine

## 2020-05-11 NOTE — Progress Notes (Signed)
Chief Complaint:   OBESITY Clayton [name] is here to discuss his progress with his obesity treatment plan along with follow-up of his obesity related diagnoses. Clayton [name] is on following a lower carbohydrate, vegetable and lean protein rich diet plan and states he is following his eating plan approximately 80% of the time. Clayton [name] states he is doing 0 minutes 0 times per week.  Today's visit was #: 26 Starting weight: 266 lbs Starting date: 11/01/2017 Today's weight: 252 lbs Today's date: 05/05/2020 Total lbs lost to date: 14 Total lbs lost since last in-office visit: 0  Interim History: Clayton [name] has been struggling with back and hip pain, which has significantly decreased his exercise and his weight is starting to creep up. He is following up with Ortho and sports medicine to help with this.  Subjective:   1. Essential hypertension Clayton [name]'s blood pressure at home are mostly in the 130's over 70's. He has a history of white coat syndrome.  Assessment/Plan:   1. Essential hypertension Clayton [name] will continue with diet and exercise to improve blood pressure control. Will continue to monitor as he continues his lifestyle modifications.  2. Obesity with current BMI of 31.5 Clayton [name] is currently in the action stage of change. As such, his goal is to continue with weight loss efforts. He has agreed to following a lower carbohydrate, vegetable and lean protein rich diet plan.   Exercise goals: All adults should avoid inactivity. Some physical activity is better than none, and adults who participate in any amount of physical activity gain some health benefits.  Behavioral modification strategies: meal planning and cooking strategies.  Clayton [name] has agreed to follow-up with our clinic in 4 weeks. He was informed of the importance of frequent follow-up visits to maximize his success with intensive lifestyle modifications for his multiple health conditions.   Objective:   Blood pressure (!) 165/84, pulse  (!) 110, temperature 97.9 F (36.6 C), height 6\' 3"  (1.905 m), weight 252 lb (114.3 kg), SpO2 97 %. Body mass index is 31.5 kg/m.  General: Cooperative, alert, well developed, in no acute distress. HEENT: Conjunctivae and lids unremarkable. Cardiovascular: Regular rhythm.  Lungs: Normal work of breathing. Neurologic: No focal deficits.   Lab Results  Component Value Date   CREATININE 1.40 01/24/2020   BUN 30 (H) 01/24/2020   NA 139 01/24/2020   K 4.3 01/24/2020   CL 106 01/24/2020   CO2 28 01/24/2020   Lab Results  Component Value Date   ALT 21 01/24/2020   AST 21 01/24/2020   ALKPHOS 63 01/24/2020   BILITOT 0.6 01/24/2020   Lab Results  Component Value Date   HGBA1C 5.8 (H) 08/01/2019   HGBA1C 5.8 (H) 09/04/2018   HGBA1C 6.0 (H) 11/01/2017   HGBA1C 5.4 04/14/2016   HGBA1C 6.0 06/09/2015   Lab Results  Component Value Date   INSULIN 3.9 08/01/2019   INSULIN 9.0 09/04/2018   INSULIN 22.4 11/01/2017   Lab Results  Component Value Date   TSH 2.12 01/24/2020   Lab Results  Component Value Date   CHOL 124 06/25/2019   HDL 56 06/25/2019   LDLCALC 55 06/25/2019   TRIG 61 06/25/2019   CHOLHDL 2.2 06/25/2019   Lab Results  Component Value Date   WBC 3.9 (L) 01/24/2020   HGB 14.3 01/24/2020   HCT 42.6 01/24/2020   MCV 89.7 01/24/2020   PLT 221.0 01/24/2020   Lab Results  Component Value Date   IRON 71 01/24/2020   FERRITIN 77.1  01/24/2020   Attestation Statements:   Reviewed by clinician on day of visit: allergies, medications, problem list, medical history, surgical history, family history, social history, and previous encounter notes.  Time spent on visit including pre-visit chart review and post-visit care and charting was 34 minutes.    I, Trixie Dredge, am acting as transcriptionist for Dennard Nip, MD.  I have reviewed the above documentation for accuracy and completeness, and I agree with the above. -  Dennard Nip, MD

## 2020-05-15 ENCOUNTER — Other Ambulatory Visit: Payer: Self-pay | Admitting: Cardiovascular Disease

## 2020-06-02 ENCOUNTER — Ambulatory Visit (INDEPENDENT_AMBULATORY_CARE_PROVIDER_SITE_OTHER): Payer: Medicare PPO | Admitting: Family Medicine

## 2020-06-02 DIAGNOSIS — M25551 Pain in right hip: Secondary | ICD-10-CM | POA: Diagnosis not present

## 2020-06-13 NOTE — Progress Notes (Signed)
[place] Phone: [phone] Subjective:   Clayton [name] serving as a scribe for Dr. Hulan Saas. This visit occurred during the SARS-CoV-2 public health emergency.  Safety protocols were in place, including screening questions prior to the visit, additional usage of staff PPE, and extensive cleaning of exam room while observing appropriate contact time as indicated for disinfecting solutions.   I'm seeing this patient by the request  of:  Kuneff, Renee A, DO  CC: Back and hip pain follow-up  [id number]  Clayton [name] is a 75 y.o. male coming in with complaint of back and neck pain. OMT 05/06/2020. Did receive injection in hip from ortho since last visit. Also had MRI on May 16th. Patient states that his knee pain is less after second shot. Wears brace with golfing.   Saw Dr. [name] for hip and it was not recommended that he have replacement but rather suffering from bursitis and stenosis. Has injection in R GT which lasted only a few days. Played 18 holes last Friday and his pain drastically increases. Using Ice and TENS. Seeing Dr. [name] tomorrow. Had MRI.            Reviewed prior external information including notes and imaging from previsou exam, outside providers and external EMR if available.   As well as notes that were available from care everywhere and other healthcare systems.  Past medical history, social, surgical and family history all reviewed in electronic medical record.  [name] pertanent information unless stated regarding to the chief complaint.   Past Medical History:  Diagnosis Date  . Arthritis    hips   . Atrial fibrillation ([place])   . Cataract   . CPAP (continuous positive airway pressure) dependence   . Dysrhythmia    PAF- hx of 7 years ago   . HLD (hyperlipidemia)   . Hypertension 05/29/10   ECHO-EF 67% wnl  . Meniere disease   . Obesity   . Palpitations   . Sleep  apnea 09/14/04   [facility]- Dr. Humphrey Rolls; CPAP titration 11/12/04- Dr. Humphrey Rolls  . Stroke [facility])     Allergies  Allergen Reactions  . Rosuvastatin     Other reaction(s): GI Upset (intolerance) Crestor - Intestinal issues   . Statins Other (See Comments)    Crestor - Intestinal issues      Review of Systems:  [name] headache, visual changes, nausea, vomiting, diarrhea, constipation, dizziness, abdominal pain, skin rash, fevers, chills, night sweats, weight loss, swollen lymph nodes, body aches, joint swelling, chest pain, shortness of breath, mood changes. POSITIVE muscle aches  Objective  Blood pressure 132/90, pulse 79, height 6\' 3"  (1.905 m), weight 259 lb (117.5 kg), SpO2 97 %.   General: [name] apparent distress alert and oriented x3 mood and affect normal, dressed appropriately.  HEENT: Pupils equal, extraocular movements intact  Respiratory: Patient's speak in full sentences and does not appear short of breath  Cardiovascular: [name] lower extremity edema, non tender, [name] erythema  Still significant decrease in range of motion of the right hip noted. Low back exam does have tightness in the thoracolumbar juncture.  Tightness around the right sacroiliac joint.  Difficulty with Clayton [name] secondary to the decreased range of motion of the hip.  Negative straight leg test. Still noted arthritic changes of the left knee but [name] significant swelling.  Full range of motion noted.  Osteopathic findings   T4 extended rotated and side  bent right L1 flexed rotated and side bent right Sacrum right on right       Assessment and Plan:  Arthritis of right hip Patient is seen another provider for this.  Discussed icing regimen and home exercises, discussed avoiding certain activities.  Patient is to increase activity slowly.  Discussed icing regimen and home exercises.  Patient likely will need a replacement in the near future.  Degenerative arthritis of left knee Patient is doing much better  after the last injection.  Feeling much better and has not had any significant pain.    Nonallopathic problems  Decision today to treat with OMT was based on Physical Exam  After verbal consent patient was treated with HVLA, ME, FPR techniques in thoracic, lumbar, and sacral  areas  Patient tolerated the procedure well with improvement in symptoms  Patient given exercises, stretches and lifestyle modifications  See medications in patient instructions if given  Patient will follow up in 4-8 weeks      The above documentation has been reviewed and is accurate and complete Clayton Pulley, DO       Note: This dictation was prepared with Dragon dictation along with smaller phrase technology. Any transcriptional errors that result from this process are unintentional.

## 2020-06-17 ENCOUNTER — Other Ambulatory Visit: Payer: Self-pay

## 2020-06-17 ENCOUNTER — Encounter: Payer: Self-pay | Admitting: Family Medicine

## 2020-06-17 ENCOUNTER — Ambulatory Visit: Payer: Medicare PPO | Admitting: Family Medicine

## 2020-06-17 VITALS — BP 132/90 | HR 79 | Ht 75.0 in | Wt 259.0 lb

## 2020-06-17 DIAGNOSIS — M9902 Segmental and somatic dysfunction of thoracic region: Secondary | ICD-10-CM

## 2020-06-17 DIAGNOSIS — M1611 Unilateral primary osteoarthritis, right hip: Secondary | ICD-10-CM | POA: Diagnosis not present

## 2020-06-17 DIAGNOSIS — M9904 Segmental and somatic dysfunction of sacral region: Secondary | ICD-10-CM | POA: Diagnosis not present

## 2020-06-17 DIAGNOSIS — M1712 Unilateral primary osteoarthritis, left knee: Secondary | ICD-10-CM

## 2020-06-17 DIAGNOSIS — M9903 Segmental and somatic dysfunction of lumbar region: Secondary | ICD-10-CM | POA: Diagnosis not present

## 2020-06-17 NOTE — Assessment & Plan Note (Signed)
Patient is doing much better after the last injection.  Feeling much better and has not had any significant pain.

## 2020-06-17 NOTE — Assessment & Plan Note (Signed)
Patient is seen another provider for this.  Discussed icing regimen and home exercises, discussed avoiding certain activities.  Patient is to increase activity slowly.  Discussed icing regimen and home exercises.  Patient likely will need a replacement in the near future.

## 2020-06-17 NOTE — Patient Instructions (Signed)
See me again in 5 weeks 

## 2020-06-18 DIAGNOSIS — M1611 Unilateral primary osteoarthritis, right hip: Secondary | ICD-10-CM | POA: Diagnosis not present

## 2020-06-19 ENCOUNTER — Encounter: Payer: Self-pay | Admitting: Family Medicine

## 2020-06-27 ENCOUNTER — Encounter: Payer: Self-pay | Admitting: Adult Health

## 2020-06-27 ENCOUNTER — Telehealth: Payer: Self-pay

## 2020-06-27 ENCOUNTER — Other Ambulatory Visit: Payer: Self-pay

## 2020-06-27 ENCOUNTER — Ambulatory Visit (INDEPENDENT_AMBULATORY_CARE_PROVIDER_SITE_OTHER): Payer: Medicare PPO | Admitting: Adult Health

## 2020-06-27 ENCOUNTER — Other Ambulatory Visit (INDEPENDENT_AMBULATORY_CARE_PROVIDER_SITE_OTHER): Payer: Self-pay | Admitting: Adult Health

## 2020-06-27 ENCOUNTER — Telehealth: Payer: Self-pay | Admitting: Cardiovascular Disease

## 2020-06-27 ENCOUNTER — Other Ambulatory Visit: Payer: Self-pay | Admitting: Cardiovascular Disease

## 2020-06-27 VITALS — BP 138/86 | HR 118 | Ht 75.0 in | Wt 262.0 lb

## 2020-06-27 DIAGNOSIS — Z9989 Dependence on other enabling machines and devices: Secondary | ICD-10-CM

## 2020-06-27 DIAGNOSIS — E669 Obesity, unspecified: Secondary | ICD-10-CM

## 2020-06-27 DIAGNOSIS — G4733 Obstructive sleep apnea (adult) (pediatric): Secondary | ICD-10-CM

## 2020-06-27 DIAGNOSIS — I10 Essential (primary) hypertension: Secondary | ICD-10-CM | POA: Diagnosis not present

## 2020-06-27 DIAGNOSIS — R0609 Other forms of dyspnea: Secondary | ICD-10-CM

## 2020-06-27 DIAGNOSIS — I4819 Other persistent atrial fibrillation: Secondary | ICD-10-CM

## 2020-06-27 DIAGNOSIS — R7303 Prediabetes: Secondary | ICD-10-CM

## 2020-06-27 DIAGNOSIS — R0600 Dyspnea, unspecified: Secondary | ICD-10-CM | POA: Diagnosis not present

## 2020-06-27 DIAGNOSIS — Z79899 Other long term (current) drug therapy: Secondary | ICD-10-CM

## 2020-06-27 MED ORDER — METOPROLOL SUCCINATE ER 50 MG PO TB24: 50.0000 mg | ORAL_TABLET | Freq: Every day | ORAL | 6 refills | Status: DC

## 2020-06-27 NOTE — Progress Notes (Signed)
Cardiology Office Note   Date:  06/27/2020   ID:  MARCELO ICKES, DOB June 19, 1945, MRN [id number]  PCP:  Ma Hillock, DO  Cardiologist:  Dr. Claiborne Billings  No chief complaint on file.    History of Present Illness: Clayton [name] is a 75 y.o. male who presents as an add-on appointment in the setting of irregular slow heart rate which he has noticed and called her office this morning to report.  He has a history of remote PAF status post cardioversion in 2007, OSA for which he uses CPAP and has been compliant, hypertension, and hyperlipidemia.  The patient had an episode of Mnire's disease which she was thought to be caused by statin therapy.  He had complete recovery with the discontinuation of statin.  The patient had recurrence of atrial fib in July 2020.  Medication was changed and amlodipine/benazepril combination was discontinued and he was started on Cardizem CD 180 mg daily.  He was also started on olmesartan 20 mg daily.  Eliquis 5 mg twice daily was begun.  He did have a DCCV in August 2020.  Afterwards the patient was started on flecainide 50 mg twice daily with close follow-up.  He was referred to electrophysiology and saw Dr. Curt Bears in January 2021 as he was in persistent atrial fibrillation.  He did undergo cardioversion again.  After follow-up the patient diltiazem was discontinued and he was started on amlodipine 5 mg.  He was to continue metoprolol olmesartan and spironolactone.  He was noted to have bradycardia with first-degree block and flecainide was reduced to 50 mg twice daily.  Dr. Claiborne Billings last saw him in October 2021 at which time he denied any recurrent episodes of atrial fibrillation.  He  continued to be on amlodipine 5 mg, metoprolol XL 100 mg in the evening with 25 or 50 mg in the morning, olmesartan 40 mg, and spironolactone 12.5 mg daily.  He was on Eliquis for anticoagulation.  He wastolerating Livalo 2 mg and Zetia for hyperlipidemia with LDL cholesterol at 55 in June  2021.  He tries to play golf several times a week if possible and continues to score well.  He continues to be followed by Dr. Dennard Nip with plans for continued weight loss.  Clayton [name] states that he went back into atrial fibrillation about 3 days ago.  He notices it most with fatigue.  He also has an additional symptom of teeth chattering when he is back in a regular rhythm.  He did not take any extra doses of flecainide or metoprolol to assist with this.  He had taking all his medications as directed.  He has not missed any doses of Eliquis.  He states that he and Dr. Curt Bears did discuss ablation and he wished to wait on it at that time but is now ready to move forward as he does not wish to continue to have these bouts of atrial fibrillation.  He admits to not being very active anymore due to musculoskeletal issues.  Most notably his right hip has been giving him a lot of pain along with his knees he is being followed by sports medicine.  He used to walk 3 miles a day but has had to stop because of the pain concerning his hip.  He denies chest pain.  He is a little short of breath due to deconditioning.  There is no PND, orthopnea, or dizziness associated with atrial fib.  Dr. Claiborne Billings has a lengthy and detailed office note  which can be reviewed for further information on this patient's history and triggers for atrial fibrillation to include stressful family issues. Past Medical History:  Diagnosis Date   Arthritis    hips    Atrial fibrillation (HCC)    Cataract    CPAP (continuous positive airway pressure) dependence    Dysrhythmia    PAF- hx of 7 years ago    HLD (hyperlipidemia)    Hypertension 05/29/10   ECHO-EF 67% wnl   Meniere disease    Obesity    Palpitations    Sleep apnea 09/14/04   [facility]- Dr. Humphrey Rolls; CPAP titration 11/12/04- Dr. Humphrey Rolls   Stroke [facility])     Past Surgical History:  Procedure Laterality Date   CARDIAC CATHETERIZATION  11/03/05    CARDIOVERSION N/A 09/26/2018   Procedure: CARDIOVERSION;  Surgeon: Geralynn Rile, MD;  Location: [facility];  Service: Endoscopy;  Laterality: N/A;   CARDIOVERSION N/A 03/13/2019   Procedure: CARDIOVERSION;  Surgeon: Skeet Latch, MD;  Location: [facility] ENDOSCOPY;  Service: Cardiovascular;  Laterality: N/A;   CATARACT EXTRACTION  2014   KNEE ARTHROSCOPY     left knee- 2002    TOTAL HIP ARTHROPLASTY  06/08/2011   Procedure: TOTAL HIP ARTHROPLASTY ANTERIOR APPROACH;  Surgeon: [name], MD;  Location: [facility];  Service: Orthopedics;  Laterality: Left;     Current Outpatient Medications  Medication Sig Dispense Refill   Apoaequorin (PREVAGEN PO) Take by mouth.     Ascorbic Acid (VITAMIN C) 1000 MG tablet Take 1,000 mg by mouth daily.      aspirin EC 81 MG tablet Take 1 tablet (81 mg total) by mouth daily. 90 tablet 3   Boswellia-Glucosamine-Vit D (GLUCOSAMINE COMPLEX PO) Take 3 tablets by mouth daily.      Coenzyme Q10 200 MG capsule Take 200 mg by mouth daily.      diazepam (VALIUM) 5 MG tablet One tab by mouth, 2 hours before procedure. 2 tablet 0   ELIQUIS 5 MG TABS tablet TAKE 1 TABLET BY MOUTH TWICE A DAY 180 tablet 1   ezetimibe (ZETIA) 10 MG tablet TAKE 1 TABLET BY MOUTH EVERY DAY 90 tablet 3   flecainide (TAMBOCOR) 50 MG tablet Take 50 mg by mouth daily.     gabapentin (NEURONTIN) 100 MG capsule Take 2 capsules (200 mg total) by mouth at bedtime. 180 capsule 3   LIVALO 2 MG TABS TAKE 1 TABLET BY MOUTH EVERY DAY 90 tablet 3   metFORMIN (GLUCOPHAGE) 500 MG tablet Take 1 tablet (500 mg total) by mouth daily with breakfast. 90 tablet 0   Multiple Vitamin (MULITIVITAMIN WITH MINERALS) TABS Take 1 tablet by mouth daily.      olmesartan (BENICAR) 40 MG tablet TAKE 1 TABLET BY MOUTH EVERY DAY 90 tablet 3   spironolactone (ALDACTONE) 25 MG tablet TAKE 1/2 TABLET BY MOUTH EVERY DAY 45 tablet 3   tamsulosin (FLOMAX) 0.4 MG CAPS capsule Take 0.4 mg by mouth daily.     triamcinolone  cream (KENALOG) 0.5 % APPLY 1 APPLICATION TOPICALLY 2 (TWO) TIMES DAILY. TO AFFECTED AREAS. 30 g 3   zinc gluconate 50 MG tablet Take 50 mg by mouth daily.      metoprolol succinate (TOPROL-XL) 50 MG 24 hr tablet Take 1 tablet (50 mg total) by mouth daily. Take with or immediately following a meal. 30 tablet 6   No current facility-administered medications for this visit.    Allergies:   Patient has no  active allergies.    Social History:  The patient  reports that he has never smoked. He has never used smokeless tobacco. He reports that he does not drink alcohol and does not use drugs.   Family History:  The patient's family history includes Arthritis in his father; Diabetes in his maternal grandfather, mother, and sister; Heart disease in his father, maternal grandfather, and paternal grandfather; Hyperlipidemia in his mother; Hypertension in his maternal grandfather and mother; Obesity in his mother; Stroke in his maternal grandmother and mother.    ROS: All other systems are reviewed and negative. Unless otherwise mentioned in H&P    PHYSICAL EXAM: VS:  BP 138/86   Pulse (!) 118   Ht 6\' 3"  (1.905 m)   Wt 262 lb (118.8 kg)   SpO2 96%   BMI 32.75 kg/m  , BMI Body mass index is 32.75 kg/m. GEN: Well nourished, well developed, in no acute distress HEENT: normal Neck: no JVD, carotid bruits, or masses Cardiac: IRRR; no murmurs, rubs, or gallops, 1+ pretibial edema  Respiratory:  Clear to auscultation bilaterally, normal work of breathing GI: soft, nontender, nondistended, + BS MS: no deformity or atrophy. Wearing a knee brace on the left.  Skin: warm and dry, no rash Neuro:  Strength and sensation are intact Psych: euthymic mood, full affect   EKG: (Personally reviewed) atrial fibrillation with RVR heart rate of 118 bpm with right bundle branch block.  Atrial fibrillation is new compared to prior EKG in February 2022.  Recent Labs: 01/24/2020: ALT 21; BUN 30; Creatinine, Ser  1.40; Hemoglobin 14.3; Platelets 221.0; Potassium 4.3; Sodium 139; TSH 2.12    Lipid Panel    Component Value Date/Time   CHOL 124 06/25/2019 1014   TRIG 61 06/25/2019 1014   HDL 56 06/25/2019 1014   CHOLHDL 2.2 06/25/2019 1014   CHOLHDL 3.8 10/12/2016 0902   VLDL 17 04/14/2016 0937   LDLCALC 55 06/25/2019 1014   LDLCALC 119 (H) 10/12/2016 0902      Wt Readings from Last 3 Encounters:  06/27/20 262 lb (118.8 kg)  06/17/20 259 lb (117.5 kg)  05/06/20 259 lb (117.5 kg)      Other studies Reviewed: Study Highlights    The left ventricular ejection fraction is mildly decreased (45-54%). Nuclear stress EF: 47%. Blood pressure demonstrated a hypertensive response to exercise. There was no ST segment deviation noted during stress. Defect 1: There is a large defect of severe severity present in the basal inferior, mid inferior and apical inferior location. Findings consistent with prior myocardial infarction with peri-infarct ischemia. This is a low risk study.   Echocardiogram 02/01/2019 Left ventricular ejection fraction, by visual estimation, is 50 to  55%. The left ventricle has low normal function. There is moderately  increased left ventricular hypertrophy.   2. Left ventricular diastolic function could not be evaluated.   3. Mildly dilated left ventricular internal cavity size.   4. The left ventricle has no regional wall motion abnormalities.   5. Global right ventricle has normal systolic function.The right  ventricular size is normal. No increase in right ventricular wall  thickness.   6. Left atrial size was mildly dilated.   7. Right atrial size was normal.   8. The mitral valve is normal in structure. Trivial mitral valve  regurgitation.   9. The tricuspid valve is normal in structure.  10. The aortic valve is tricuspid. Aortic valve regurgitation is not  visualized. No evidence of aortic valve sclerosis or  stenosis.  11. The pulmonic valve was grossly normal.  Pulmonic valve regurgitation is  not visualized.  12. Aortic dilatation noted.  13. There is mild dilatation of the ascending aorta measuring 39 mm.  14. The inferior vena cava is normal in size with greater than 50%  respiratory variability, suggesting right atrial pressure of 3 mmHg.  ASSESSMENT AND PLAN:  1.  PAF: He has now back in atrial fibrillation despite medical management and 2 cardioversions in the past.  He refuses to take additional doses of flecainide when he is out of rhythm.  Due to elevated heart rate I have asked him to increase his metoprolol to 50 mg in the morning instead of 25 mg in the morning and continue 100 mg in the p.m.  He will continue the flecainide 50 mg twice daily and Eliquis as directed.  I will refer him to Dr. Curt Bears for discussion of atrial fibrillation.  The patient states that this has already been discussed with him by Dr. Curt Bears and he refused at that time but is now willing to proceed.  We will have him see Dr. Curt Bears for discussion and scheduling of this.  In the interim I will check a BMET and a CBC along with a TSH to evaluate for other contributing factors for his PAF.  The patient wishes to speak to Dr. Claiborne Billings by phone at Dr. Evette Georges convenience.  I have given this information to the nurse to pass along to him.  2.  OSA: He is very compliant with CPAP and has not missed any nights using it or even when he is taking naps during the day.  3.  Chronic right hip pain: Followed by sports medicine.  He has received some steroid injections but none that has occurred within the last 3 weeks.  They would like to do an arthroscopy in July.  This may need to be delayed if ablation is planned.  4.  Hypertension: Blood pressures currently well controlled although diastolic pressure is slightly elevated.  Will not make any changes in his medicine.  Can consider adding low-dose diuretic of chlorthalidone if lower extremity edema becomes more prominent during  atrial fibs.   Current medicines are reviewed at length with the patient today.  I have spent 45 min's dedicated to the care of this patient on the date of this encounter to include pre-visit review of records, assessment, management and diagnostic testing,with shared decision making.  Labs/ tests ordered today include: BMET, CBC, TSH.  Referral to electrophysiology Dr. Curt Bears. Phill Myron. [place] Pugh, ANP, AACC   06/27/2020 12:13 PM    [facility] [place] [address] Office [phone] Fax [phone]  Notice: This dictation was prepared with Dragon dictation along with smaller phrase technology. Any transcriptional errors that result from this process are unintentional and may not be corrected upon review.

## 2020-06-27 NOTE — Patient Instructions (Signed)
Medication Instructions:  TAKE METOPROLOL 50MG  IN THE AM *If you need a refill on your cardiac medications before your next appointment, please call your pharmacy*  Lab Work: BMET CBC AND TSH TODAY If you have labs (blood work) drawn today and your tests are completely normal, you will receive your results only by:  [place] (if you have MyChart) OR A paper copy in the mail.  If you have any lab test that is abnormal or we need to change your treatment, we will call you to review the results. You may go to any [facility] that is convenient for you however, we do have a lab in our office that is able to assist you. You DO NOT need an appointment for our lab. The lab is open 8:00am and closes at 4:00pm. Lunch 12:45 - 1:45pm.  Follow-Up: Your next appointment: FOLLOW UP WITH DR [place]  Your next appointment:  AFTER APPT WITH DR Curt Bears In Person with You may see Shelva Majestic, MDKATHRYN Purcell Nails, DNP or one of the following Advanced Practice Providers on your designated Care Team:  Almyra Deforest, PA-C  Fabian Sharp, [place] or Roby Lofts, PA-C   At [facility] you and your health needs are our priority.  As part of our continuing mission to provide you with exceptional heart care, we have created designated Provider Care Teams.  These Care Teams include your primary Cardiologist (physician) and Advanced Practice Providers (APPs -  Physician Assistants and Nurse Practitioners) who all work together to provide you with the care you need, when you need it.

## 2020-06-27 NOTE — Telephone Encounter (Signed)
   [facility] Pre-operative Risk Assessment    Patient Name: NEILS SIRACUSA  DOB: [birth year]  MRN: [id number]   HEARTCARE STAFF: - Please ensure there is not already an duplicate clearance open for this procedure. - Under Visit Info/Reason for Call, type in Other and utilize the format Clearance MM/DD/YY or Clearance TBD. Do not use dashes or single digits. - If request is for dental extraction, please clarify the # of teeth to be extracted. - If the patient is currently at the dentist's office, call Pre-Op APP to address. If the patient is not currently in the dentist office, please route to the Pre-Op pool  Request for surgical clearance:  What type of surgery is being performed? Right total hip arthroplasty    When is this surgery scheduled? 08/12/2020   What type of clearance is required (medical clearance vs. Pharmacy clearance to hold med vs. Both)? BOTH  Are there any medications that need to be held prior to surgery and how long? ELIQUIS, ASPIRIN   Practice name and name of physician performing surgery? [facility]- Dr. Paralee Cancel    What is the office phone number? [phone]- attn sherry willis    7.   What is the office fax number? [phone]  8.   Anesthesia type (None, local, MAC, general) ? Spinal    Ena Dawley 06/27/2020, 4:31 PM  _________________________________________________________________   (provider comments below)

## 2020-06-27 NOTE — Telephone Encounter (Signed)
Spoke with patient regarding blood pressure reading showing irregular HR  Patient does feel a little flutter at times and some fatigue but realizes age could be factor in fatigue Does not check blood pressure daily, last checked about 3 days ago Has had cardioversion's in the past Patient compliant with medications Requested appointment to be seen for confirmation of Afib and if anything differently needs to be done Scheduled appointmnet today with Arnold Long DNP

## 2020-06-27 NOTE — Telephone Encounter (Signed)
    Patient c/o Palpitations:  High priority if patient c/o lightheadedness, shortness of breath, or chest pain  How long have you had palpitations/irregular HR/ Afib? Are you having the symptoms now? Yes   Are you currently experiencing lightheadedness, SOB or CP? He already have these symptoms anyway  Do you have a history of afib (atrial fibrillation) or irregular heart rhythm? yes  Have you checked your BP or HR? (document readings if available):   Are you experiencing any other symptoms? Pt said his BP cuff said he has Afib this morning, he would like to be seen to confirm if he is in Afib

## 2020-06-28 LAB — CBC
Hematocrit: 44 % (ref 37.5–51.0)
Hemoglobin: 14.8 g/dL (ref 13.0–17.7)
MCH: 30 pg (ref 26.6–33.0)
MCHC: 33.6 g/dL (ref 31.5–35.7)
MCV: 89 fL (ref 79–97)
Platelets: 206 10*3/uL (ref 150–450)
RBC: 4.94 x10E6/uL (ref 4.14–5.80)
RDW: 13.4 % (ref 11.6–15.4)
WBC: 7.4 10*3/uL (ref 3.4–10.8)

## 2020-06-28 LAB — BASIC METABOLIC PANEL
BUN/Creatinine Ratio: 24 (ref 10–24)
BUN: 37 mg/dL — ABNORMAL HIGH (ref 8–27)
CO2: 19 mmol/L — ABNORMAL LOW (ref 20–29)
Calcium: 9.8 mg/dL (ref 8.6–10.2)
Chloride: 108 mmol/L — ABNORMAL HIGH (ref 96–106)
Creatinine, Ser: 1.57 mg/dL — ABNORMAL HIGH (ref 0.76–1.27)
Glucose: 96 mg/dL (ref 65–99)
Potassium: 4.9 mmol/L (ref 3.5–5.2)
Sodium: 141 mmol/L (ref 134–144)
eGFR: 46 mL/min/{1.73_m2} — ABNORMAL LOW (ref >59)

## 2020-06-28 LAB — TSH: TSH: 1.43 u[IU]/mL (ref 0.450–4.500)

## 2020-06-30 ENCOUNTER — Other Ambulatory Visit: Payer: Self-pay

## 2020-06-30 ENCOUNTER — Encounter (INDEPENDENT_AMBULATORY_CARE_PROVIDER_SITE_OTHER): Payer: Self-pay | Admitting: Family Medicine

## 2020-06-30 ENCOUNTER — Ambulatory Visit: Payer: Medicare PPO | Admitting: Cardiology

## 2020-06-30 ENCOUNTER — Ambulatory Visit (INDEPENDENT_AMBULATORY_CARE_PROVIDER_SITE_OTHER): Payer: Medicare PPO | Admitting: Family Medicine

## 2020-06-30 ENCOUNTER — Encounter: Payer: Self-pay | Admitting: Cardiology

## 2020-06-30 ENCOUNTER — Telehealth: Payer: Self-pay

## 2020-06-30 VITALS — BP 119/75 | HR 67 | Temp 98.3°F | Ht 75.0 in | Wt 251.0 lb

## 2020-06-30 VITALS — BP 136/82 | HR 85 | Ht 75.0 in | Wt 260.0 lb

## 2020-06-30 DIAGNOSIS — I4891 Unspecified atrial fibrillation: Secondary | ICD-10-CM | POA: Diagnosis not present

## 2020-06-30 DIAGNOSIS — R7303 Prediabetes: Secondary | ICD-10-CM | POA: Diagnosis not present

## 2020-06-30 DIAGNOSIS — I4819 Other persistent atrial fibrillation: Secondary | ICD-10-CM

## 2020-06-30 DIAGNOSIS — Z6835 Body mass index (BMI) 35.0-35.9, adult: Secondary | ICD-10-CM | POA: Diagnosis not present

## 2020-06-30 DIAGNOSIS — E66812 Obesity, class 2: Secondary | ICD-10-CM

## 2020-06-30 MED ORDER — METFORMIN HCL 500 MG PO TABS: 500.0000 mg | ORAL_TABLET | Freq: Every day | ORAL | 1 refills | Status: DC

## 2020-06-30 NOTE — Telephone Encounter (Signed)
Patient with diagnosis of PAF on Eliquis for anticoagulation.    Procedure: Right Total Hip Arthoplasty Date of procedure: 08/12/20   CHA2DS2-VASc Score = 4  This indicates a 4.8% annual risk of stroke. The patient's score is based upon: CHF History: No HTN History: Yes Diabetes History: No Stroke History: Yes Vascular Disease History: No Age Score: 1 Gender Score: 0     CrCl 58 Platelet count 221  Patient with history of stroke but high risk for bleeding given he will undergo spinal anesthesia. Would normally recommend holding anticoagulation for 3 days but will defer to MD.   Claudina Lick, PharmD 06/30/2020 9:18 AM

## 2020-06-30 NOTE — Telephone Encounter (Signed)
Surgical clearance forms received on 06/27/20. Patient has been scheduled on n/a to surgical clearance appt. Forms have been placed in basket.  LVM for pt to call and discuss scheduling

## 2020-06-30 NOTE — H&P (View-Only) (Signed)
Electrophysiology Office Note   Date:  07/01/2020   ID:  BOUBACAR LERETTE, DOB 1945-11-15, MRN [id number]  PCP:  Ma Hillock, DO  Cardiologist:  Claiborne Billings Primary Electrophysiologist:  Amrie Gurganus Meredith Leeds, MD    Chief Complaint: AF   History of Present Illness: Clayton [name] is a 75 y.o. male who is being seen today for the evaluation of AF at the request of Kuneff, Renee A, DO. Presenting today for electrophysiology evaluation.  He has a history of atrial fibrillation, hypertension, sleep apnea.  He is currently on flecainide.  He has had multiple episodes of atrial fibrillation with cardioversions.  He had an episode of disorientation and anxiousness associated with transient aphasia.  This was due to mild cognitive impairment.  Today, denies symptoms of chest pain, shortness of breath, orthopnea, PND, lower extremity edema, claudication, dizziness, presyncope, syncope, bleeding, or neurologic sequela. The patient is tolerating medications without difficulties.  He currently feels weak and fatigued and mildly short of breath.  He feels that he went into atrial fibrillation a few weeks ago, and has continued to have similar issues.  He would like to avoid long-term antiarrhythmics and would prefer ablation.   Past Medical History:  Diagnosis Date   Arthritis    hips    Atrial fibrillation (HCC)    Cataract    CPAP (continuous positive airway pressure) dependence    Dysrhythmia    PAF- hx of 7 years ago    HLD (hyperlipidemia)    Hypertension 05/29/10   ECHO-EF 67% wnl   Meniere disease    Obesity    Palpitations    Sleep apnea 09/14/04   [facility]- Dr. Humphrey Rolls; CPAP titration 11/12/04- Dr. Humphrey Rolls   Stroke [facility])    Past Surgical History:  Procedure Laterality Date   CARDIAC CATHETERIZATION  11/03/05   CARDIOVERSION N/A 09/26/2018   Procedure: CARDIOVERSION;  Surgeon: Geralynn Rile, MD;  Location: [facility];  Service: Endoscopy;  Laterality: N/A;    CARDIOVERSION N/A 03/13/2019   Procedure: CARDIOVERSION;  Surgeon: Skeet Latch, MD;  Location: [facility] ENDOSCOPY;  Service: Cardiovascular;  Laterality: N/A;   CATARACT EXTRACTION  2014   KNEE ARTHROSCOPY     left knee- 2002    TOTAL HIP ARTHROPLASTY  06/08/2011   Procedure: TOTAL HIP ARTHROPLASTY ANTERIOR APPROACH;  Surgeon: [name], MD;  Location: [facility];  Service: Orthopedics;  Laterality: Left;     Current Outpatient Medications  Medication Sig Dispense Refill   Apoaequorin (PREVAGEN PO) Take by mouth.     Ascorbic Acid (VITAMIN C) 1000 MG tablet Take 1,000 mg by mouth daily.      Coenzyme Q10 200 MG capsule Take 200 mg by mouth daily.      ELIQUIS 5 MG TABS tablet TAKE 1 TABLET BY MOUTH TWICE A DAY 180 tablet 1   ezetimibe (ZETIA) 10 MG tablet TAKE 1 TABLET BY MOUTH EVERY DAY 90 tablet 3   flecainide (TAMBOCOR) 50 MG tablet Take 50 mg by mouth daily.     gabapentin (NEURONTIN) 100 MG capsule Take 2 capsules (200 mg total) by mouth at bedtime. 180 capsule 3   LIVALO 2 MG TABS TAKE 1 TABLET BY MOUTH EVERY DAY 90 tablet 3   metFORMIN (GLUCOPHAGE) 500 MG tablet Take 1 tablet (500 mg total) by mouth daily with breakfast. 90 tablet 1   metoprolol succinate (TOPROL-XL) 50 MG 24 hr tablet Take 1 tablet (50 mg total) by mouth daily. Take with  or immediately following a meal. 30 tablet 6   Multiple Vitamin (MULITIVITAMIN WITH MINERALS) TABS Take 1 tablet by mouth daily.      olmesartan (BENICAR) 40 MG tablet TAKE 1 TABLET BY MOUTH EVERY DAY 90 tablet 3   spironolactone (ALDACTONE) 25 MG tablet TAKE 1/2 TABLET BY MOUTH EVERY DAY 45 tablet 3   tamsulosin (FLOMAX) 0.4 MG CAPS capsule Take 0.4 mg by mouth daily.     zinc gluconate 50 MG tablet Take 50 mg by mouth daily.      No current facility-administered medications for this visit.    Allergies:   Patient has no known allergies.   Social History:  The patient  reports that he has never smoked. He has never used smokeless tobacco.  He reports that he does not drink alcohol and does not use drugs.   Family History:  The patient's family history includes Arthritis in his father; Diabetes in his maternal grandfather, mother, and sister; Heart disease in his father, maternal grandfather, and paternal grandfather; Hyperlipidemia in his mother; Hypertension in his maternal grandfather and mother; Obesity in his mother; Stroke in his maternal grandmother and mother.   ROS:  Please see the history of present illness.   Otherwise, review of systems is positive for none.   All other systems are reviewed and negative.   PHYSICAL EXAM: VS:  BP 136/82   Pulse 85   Ht 6\' 3"  (1.905 m)   Wt 260 lb (117.9 kg)   SpO2 97%   BMI 32.50 kg/m  , BMI Body mass index is 32.5 kg/m. GEN: Well nourished, well developed, in no acute distress  HEENT: normal  Neck: no JVD, carotid bruits, or masses Cardiac: Irregular; no murmurs, rubs, or gallops,no edema  Respiratory:  clear to auscultation bilaterally, normal work of breathing GI: soft, nontender, nondistended, + BS MS: no deformity or atrophy  Skin: warm and dry Neuro:  Strength and sensation are intact Psych: euthymic mood, full affect  EKG:  EKG is ordered today. Personal review of the ekg ordered  shows atrial fibrillation.  RBBB   Recent Labs: 01/24/2020: ALT 21 06/27/2020: BUN 37; Creatinine, Ser 1.57; Hemoglobin 14.8; Platelets 206; Potassium 4.9; Sodium 141; TSH 1.430    Lipid Panel     Component Value Date/Time   CHOL 124 06/25/2019 1014   TRIG 61 06/25/2019 1014   HDL 56 06/25/2019 1014   CHOLHDL 2.2 06/25/2019 1014   CHOLHDL 3.8 10/12/2016 0902   VLDL 17 04/14/2016 0937   LDLCALC 55 06/25/2019 1014   LDLCALC 119 (H) 10/12/2016 0902     Wt Readings from Last 3 Encounters:  06/30/20 260 lb (117.9 kg)  06/30/20 251 lb (113.9 kg)  06/27/20 262 lb (118.8 kg)      Other studies Reviewed: Additional studies/ records that were reviewed today include: TTE  02/01/2019 Review of the above records today demonstrates:   1. Left ventricular ejection fraction, by visual estimation, is 50 to 55%. The left ventricle has low normal function. There is moderately increased left ventricular hypertrophy.  2. Left ventricular diastolic function could not be evaluated.  3. Mildly dilated left ventricular internal cavity size.  4. The left ventricle has no regional wall motion abnormalities.  5. Global right ventricle has normal systolic function.The right ventricular size is normal. No increase in right ventricular wall thickness.  6. Left atrial size was mildly dilated.  7. Right atrial size was normal.  8. The mitral valve is normal in structure.  Trivial mitral valve regurgitation.  9. The tricuspid valve is normal in structure. 10. The aortic valve is tricuspid. Aortic valve regurgitation is not visualized. No evidence of aortic valve sclerosis or stenosis. 11. The pulmonic valve was grossly normal. Pulmonic valve regurgitation is not visualized. 12. Aortic dilatation noted. 13. There is mild dilatation of the ascending aorta measuring 39 mm. 14. The inferior vena cava is normal in size with greater than 50% respiratory variability, suggesting right atrial pressure of 3 mmHg.   ASSESSMENT AND PLAN:  1.  Persistent atrial fibrillation: CHA2DS2-VASc of 2.  Currently on flecainide, metoprolol, Eliquis.  High risk medication monitoring.  Prefer ablation over medical management for his atrial fibrillation.  He has surgery planned for the end of July.  We Degan Hanser hold off on ablation until after his surgery.  As he is feeling poorly, we Terique Kawabata load him on amiodarone and stop his flecainide.  We Maida Widger plan for cardioversion in 2 weeks.  Risk, benefits, and alternatives to EP study and radiofrequency ablation for afib were also discussed in detail today. These risks include but are not limited to stroke, bleeding, vascular damage, tamponade, perforation, damage to the  esophagus, lungs, and other structures, pulmonary vein stenosis, worsening renal function, and death. The patient understands these risk and wishes to proceed.  We Ji Feldner therefore proceed with catheter ablation at the next available time.  Carto, ICE, anesthesia are requested for the procedure.  Berman Grainger also obtain CT PV protocol prior to the procedure to exclude LAA thrombus and further evaluate atrial anatomy.   2.  Hypertension: Currently well controlled  3.  Obstructive sleep apnea: CPAP compliance encouraged  4.  Preoperative evaluation: Has plans for right hip arthroplasty on 08/12/2020.  He is able to do all of his daily activities.  He would be at low to intermediate risk for this intermediate risk procedure.  Would be okay to hold anticoagulation for 2 days prior to surgery.  Case discussed with primary cardiology  Current medicines are reviewed at length with the patient today.   The patient does not have concerns regarding his medicines.  The following changes were made today: None  Labs/ tests ordered today include:  Orders Placed This Encounter  Procedures   EKG 12-Lead      Disposition:   FU with Clyde Zarrella 3 months  Signed, Colbey Wirtanen Meredith Leeds, MD  07/01/2020 7:52 AM     [address] [place] [address] [phone] (office) [phone] (fax)

## 2020-06-30 NOTE — Telephone Encounter (Signed)
   Name: Clayton [name]  DOB: 1945-02-11  MRN: [id number]  Primary Cardiologist: Shelva Majestic, MD  Chart reviewed as part of pre-operative protocol coverage. Because of Clayton [name]'s past medical history and time since last visit, he will require a follow-up visit in order to better assess preoperative cardiovascular risk.  Pre-op covering staff: -Please add "pre-op clearance" to the appointment notes so provider is aware>>>appointment TODAY with Dr. Curt Bears    If applicable, this message will also be routed to pharmacy pool and/or primary cardiologist for input on holding anticoagulant/antiplatelet agent as requested below so that this information is available to the clearing provider at time of patient's appointment.   Kathyrn Drown, NP  06/30/2020, 8:21 AM

## 2020-06-30 NOTE — Telephone Encounter (Signed)
Pt has appt today with Dr. Curt Bears. I will forward clearance notes to MD for appt today to address for pre op clearance.

## 2020-06-30 NOTE — Patient Instructions (Addendum)
Medication Instructions:  Your physician recommends that you continue on your current medications as directed. Please refer to the Current Medication list given to you today.  *If you need a refill on your cardiac medications before your next appointment, please call your pharmacy*   Lab Work: Pre procedure labs _______________:  BMP & CBC  If you have labs (blood work) drawn today and your tests are completely normal, you will receive your results only by: [place] (if you have MyChart) OR A paper copy in the mail If you have any lab test that is abnormal or we need to change your treatment, we will call you to review the results.   Testing/Procedures: Your physician has recommended that you have a Cardioversion (DCCV). Electrical Cardioversion uses a jolt of electricity to your heart either through paddles or wired patches attached to your chest. This is a controlled, usually prescheduled, procedure. Defibrillation is done under light anesthesia in the hospital, and you usually go home the day of the procedure. This is done to get your heart back into a normal rhythm. You are not awake for the procedure. Please see the instruction sheet below located under "other instructions".   Your physician has requested that you have cardiac CT within 7 days PRIOR to your ablation. Cardiac computed tomography (CT) is a painless test that uses an x-ray machine to take clear, detailed pictures of your heart.  Please follow instruction below located under "other instructions". You will get a call from our office to schedule the date for this test.  Your physician has recommended that you have an ablation. Catheter ablation is a medical procedure used to treat some cardiac arrhythmias (irregular heartbeats). During catheter ablation, a long, thin, flexible tube is put into a blood vessel in your groin (upper thigh), or neck. This tube is called an ablation catheter. It is then guided to your heart  through the blood vessel. Radio frequency waves destroy small areas of heart tissue where abnormal heartbeats may cause an arrhythmia to start. Please follow instruction below located under "other instructions".   Follow-Up: At [facility], you and your health needs are our priority.  As part of our continuing mission to provide you with exceptional heart care, we have created designated Provider Care Teams.  These Care Teams include your primary Cardiologist (physician) and Advanced Practice Providers (APPs -  Physician Assistants and Nurse Practitioners) who all work together to provide you with the care you need, when you need it.  We recommend signing up for the patient portal called "MyChart".  Sign up information is provided on this After Visit Summary.  MyChart is used to connect with patients for Virtual Visits (Telemedicine).  Patients are able to view lab/test results, encounter notes, upcoming appointments, etc.  Non-urgent messages can be sent to your provider as well.   To learn more about what you can do with MyChart, go to [email or web address].    Your next appointment:   1 month(s) after your ablation  The format for your next appointment:   In Person  Provider:   AFib clinic   Thank you for choosing [facility]!!   Trinidad Curet, RN [phone]    Other Instructions  CARDIOVERSION INSTRUCTIONS You are scheduled for a Cardioversion on 07/14/2020 with Dr. Stanford Breed.  Please arrive at the [facility] (Main Entrance A) at [facility]: [address] [place], [place] [address] at 7:30 am.  DIET: Nothing to eat or drink after midnight except a  sip of water with medications (see medication instructions below)  FYI: For your safety, and to allow [name] to monitor your vital signs accurately during the surgery/procedure we request that   if you have artificial nails, gel coating, SNS etc. Please have those removed prior to your surgery/procedure. Not having the  nail coverings /polish removed may result in cancellation or delay of your surgery/procedure.  Medication Instructions: Hold Metformin  Continue your anticoagulant: Eliquis You will need to continue your anticoagulant after your procedure until you are told by your provider that it is safe to stop   Labs: 06/27/2020  You must have a responsible person to drive you home and stay in the waiting area during your procedure. Failure to do so could result in cancellation.  Bring your insurance cards.  *Special Note: Every effort is made to have your procedure done on time. Occasionally there are emergencies that occur at the hospital that may cause delays. Please be patient if a delay does occur.      CT INSTRUCTIONS Your cardiac CT will be scheduled at:  [facility] [address] [place], [place] [address] [phone]  Please arrive at the [facility] main entrance (entrance A) of [facility] 30 minutes prior to test start time. Proceed to the [facility] Radiology Department (first floor) to check-in and test prep.  Please follow these instructions carefully (unless otherwise directed):  Hold all erectile dysfunction medications at least 3 days (72 hrs) prior to test.  On the Night Before the Test: Be sure to Drink plenty of water. Do not consume any caffeinated/decaffeinated beverages or chocolate 12 hours prior to your test. Do not take any antihistamines 12 hours prior to your test.  On the Day of the Test: Drink plenty of water until 1 hour prior to the test. Do not eat any food 4 hours prior to the test. You may take your regular medications prior to the test.  Take metoprolol (Lopressor) two hours prior to test -- the nurse will let you know if this is needed closer to the procedure.  Monitor your heart rates HOLD Furosemide/Hydrochlorothiazide morning of the test.      After the Test: Drink plenty of water. After receiving IV contrast, you may  experience a mild flushed feeling. This is normal. On occasion, you may experience a mild rash up to 24 hours after the test. This is not dangerous. If this occurs, you can take Benadryl 25 mg and increase your fluid intake. If you experience trouble breathing, this can be serious. If it is severe call 911 IMMEDIATELY. If it is mild, please call our office. If you take any of these medications: Glipizide/Metformin, Avandament, Glucavance, please do not take 48 hours after completing test unless otherwise instructed.   Once we have confirmed authorization from your insurance company, we will call you to set up a date and time for your test. Based on how quickly your insurance processes prior authorizations requests, please allow up to 4 weeks to be contacted for scheduling your Cardiac CT appointment. Be advised that routine Cardiac CT appointments could be scheduled as many as 8 weeks after your provider has ordered it.  For non-scheduling related questions, please contact the cardiac imaging nurse navigator should you have any questions/concerns: Marchia Bond, Cardiac Imaging Nurse Navigator Gordy Clement, Cardiac Imaging Nurse Navigator [facility] Direct Office Dial: [phone]   For scheduling needs, including cancellations and rescheduling, please call [place], [phone].  Electrophysiology/Ablation Procedure Instructions   You are scheduled for a(n) AFib ablation on 09/10/2020 with Dr. Allegra Lai.   1.   Pre procedure testing-             A.  LAB WORK --- ________________ for your pre procedure blood work.     On the day of your procedure  09/10/2020 you will go to [facility] [phone]) at 6:30 am.  Dennis Bast will go to the main entrance A [employer]) and enter where the [employer] are.  Your driver will drop you off and you will head down the hallway to ADMITTING.  You may have one support person come in to the hospital with  you.  They will be asked to wait in the waiting room. It is OK to have someone drop you off and come back when you are ready to be discharged.   3.   Do not eat or drink after midnight prior to your procedure.   4.   On the morning of your procedure do NOT take any medication. Do not miss any doses of your blood thinner prior to the morning of your procedure or your procedure will need to be rescheduled.    5.  Plan for an overnight stay but you may be discharged after your procedure, if you use your phone frequently bring your phone charger. If you are discharged after your procedure you will need someone to drive you home and be with you for 24 hours after your procedure.   6. You will follow up with the AFIB clinic 4 weeks after your procedure.  You will follow up with Dr. Curt Bears  3 months after your procedure.  These appointments will be made for you.   * If you have ANY questions please call the office [phone] and ask for Langston Tuberville RN or send me a MyChart message   * Occasionally, EP Studies and ablations can become lengthy.  Please make your family aware of this before your procedure starts.  Average time ranges from 2-8 hours for EP studies/ablations.  Your physician will call your family after the procedure with the results.                                     Cardiac electrophysiology: From cell to bedside (7th ed., pp. 2831-5176). [place], [place]: Elsevier.">  Cardiac Ablation Cardiac ablation is a procedure to destroy, or ablate, a small amount of heart tissue in very specific places. The heart has many electrical connections. Sometimes these connections are abnormal and can cause the heart to beat very fast or irregularly. Ablating some of the areas that cause problems can improve the heart's rhythm or return it to normal. Ablation may be done for people who: Have Wolff-Parkinson-White syndrome. Have fast heart rhythms (tachycardia). Have taken medicines for an abnormal  heart rhythm (arrhythmia) that were not effective or caused side effects. Have a high-risk heartbeat that may be life-threatening. During the procedure, a small incision is made in the neck or the groin, and a long, thin tube (catheter) is inserted into the incision and moved to the heart. Small devices (electrodes) on the tip of the catheter will send out electrical currents. A type of X-ray (fluoroscopy) will be used to help guide the catheter and to provide images of the heart. Tell a health care provider about: Any allergies you have.  All medicines you are taking, including vitamins, herbs, eye drops, creams, and over-the-counter medicines. Any problems you or family members have had with anesthetic medicines. Any blood disorders you have. Any surgeries you have had. Any medical conditions you have, such as kidney failure. Whether you are pregnant or may be pregnant. What are the risks? Generally, this is a safe procedure. However, problems may occur, including: Infection. Bruising and bleeding at the catheter insertion site. Bleeding into the chest, especially into the sac that surrounds the heart. This is a serious complication. Stroke or blood clots. Damage to nearby structures or organs. Allergic reaction to medicines or dyes. Need for a permanent pacemaker if the normal electrical system is damaged. A pacemaker is a small computer that sends electrical signals to the heart and helps your heart beat normally. The procedure not being fully effective. This may not be recognized until months later. Repeat ablation procedures are sometimes done. What happens before the procedure? Medicines Ask your health care provider about: Changing or stopping your regular medicines. This is especially important if you are taking diabetes medicines or blood thinners. Taking medicines such as aspirin and ibuprofen. These medicines can thin your blood. Do not take these medicines unless your health care  provider tells you to take them. Taking over-the-counter medicines, vitamins, herbs, and supplements. General instructions Follow instructions from your health care provider about eating or drinking restrictions. Plan to have someone take you home from the hospital or clinic. If you will be going home right after the procedure, plan to have someone with you for 24 hours. Ask your health care provider what steps will be taken to prevent infection. What happens during the procedure?  An IV will be inserted into one of your veins. You will be given a medicine to help you relax (sedative). The skin on your neck or groin will be numbed. An incision will be made in your neck or your groin. A needle will be inserted through the incision and into a large vein in your neck or groin. A catheter will be inserted into the needle and moved to your heart. Dye may be injected through the catheter to help your surgeon see the area of the heart that needs treatment. Electrical currents will be sent from the catheter to ablate heart tissue in desired areas. There are three types of energy that may be used to do this: Heat (radiofrequency energy). Laser energy. Extreme cold (cryoablation). When the tissue has been ablated, the catheter will be removed. Pressure will be held on the insertion area to prevent a lot of bleeding. A bandage (dressing) will be placed over the insertion area. The exact procedure may vary among health care providers and hospitals. What happens after the procedure? Your blood pressure, heart rate, breathing rate, and blood oxygen level will be monitored until you leave the hospital or clinic. Your insertion area will be monitored for bleeding. You will need to lie still for a few hours to ensure that you do not bleed from the insertion area. Do not drive for 24 hours or as long as told by your health care provider. Summary Cardiac ablation is a procedure to destroy, or ablate, a  small amount of heart tissue using an electrical current. This procedure can improve the heart rhythm or return it to normal. Tell your health care provider about any medical conditions you may have and all medicines you are taking to treat them. This is a safe procedure, but problems may occur.  Problems may include infection, bruising, damage to nearby organs or structures, or allergic reactions to medicines. Follow your health care provider's instructions about eating and drinking before the procedure. You may also be told to change or stop some of your medicines. After the procedure, do not drive for 24 hours or as long as told by your health care provider. This information is not intended to replace advice given to you by your health care provider. Make sure you discuss any questions you have with your healthcare provider. Document Revised: 11/13/2018 Document Reviewed: 11/13/2018 Elsevier Patient Education  [place].

## 2020-06-30 NOTE — Progress Notes (Signed)
Electrophysiology Office Note   Date:  07/01/2020   ID:  Clayton [name], DOB August 01, 1945, MRN [id number]  PCP:  Clayton Hillock, DO  Cardiologist:  Clayton [name] Primary Electrophysiologist:  Clayton [name] Clayton Leeds, MD    Chief Complaint: AF   History of Present Illness: Clayton [name] is [name] 75 y.o. male who is being seen today for the evaluation of AF at the request of Kuneff, Renee A, DO. Presenting today for electrophysiology evaluation.  He has [name] history of atrial fibrillation, hypertension, sleep apnea.  He is currently on flecainide.  He has had multiple episodes of atrial fibrillation with cardioversions.  He had an episode of disorientation and anxiousness associated with transient aphasia.  This was due to mild cognitive impairment.  Today, denies symptoms of chest pain, shortness of breath, orthopnea, PND, lower extremity edema, claudication, dizziness, presyncope, syncope, bleeding, or neurologic sequela. The patient is tolerating medications without difficulties.  He currently feels weak and fatigued and mildly short of breath.  He feels that he went into atrial fibrillation [name] few weeks ago, and has continued to have similar issues.  He would like to avoid long-term antiarrhythmics and would prefer ablation.   Past Medical History:  Diagnosis Date   Arthritis    hips    Atrial fibrillation (HCC)    Cataract    CPAP (continuous positive airway pressure) dependence    Dysrhythmia    PAF- hx of 7 years ago    HLD (hyperlipidemia)    Hypertension 05/29/10   ECHO-EF 67% wnl   Meniere disease    Obesity    Palpitations    Sleep apnea 09/14/04   [facility]- Dr. Humphrey Rolls; CPAP titration 11/12/04- Dr. Humphrey Rolls   Stroke [facility])    Past Surgical History:  Procedure Laterality Date   CARDIAC CATHETERIZATION  11/03/05   CARDIOVERSION N/[name] 09/26/2018   Procedure: CARDIOVERSION;  Surgeon: Clayton Rile, MD;  Location: [facility];  Service: Endoscopy;  Laterality: N/[name];    CARDIOVERSION N/[name] 03/13/2019   Procedure: CARDIOVERSION;  Surgeon: Clayton Latch, MD;  Location: [facility] ENDOSCOPY;  Service: Cardiovascular;  Laterality: N/[name];   CATARACT EXTRACTION  2014   KNEE ARTHROSCOPY     left knee- 2002    TOTAL HIP ARTHROPLASTY  06/08/2011   Procedure: TOTAL HIP ARTHROPLASTY ANTERIOR APPROACH;  Surgeon: Clayton Pole, MD;  Location: [facility] ORS;  Service: Orthopedics;  Laterality: Left;     Current Outpatient Medications  Medication Sig Dispense Refill   Apoaequorin (PREVAGEN PO) Take by mouth.     Ascorbic Acid (VITAMIN C) 1000 MG tablet Take 1,000 mg by mouth daily.      Coenzyme Q10 200 MG capsule Take 200 mg by mouth daily.      ELIQUIS 5 MG TABS tablet TAKE 1 TABLET BY MOUTH TWICE [name] DAY 180 tablet 1   ezetimibe (ZETIA) 10 MG tablet TAKE 1 TABLET BY MOUTH EVERY DAY 90 tablet 3   flecainide (TAMBOCOR) 50 MG tablet Take 50 mg by mouth daily.     gabapentin (NEURONTIN) 100 MG capsule Take 2 capsules (200 mg total) by mouth at bedtime. 180 capsule 3   LIVALO 2 MG TABS TAKE 1 TABLET BY MOUTH EVERY DAY 90 tablet 3   metFORMIN (GLUCOPHAGE) 500 MG tablet Take 1 tablet (500 mg total) by mouth daily with breakfast. 90 tablet 1   metoprolol succinate (TOPROL-XL) 50 MG 24 hr tablet Take 1 tablet (50 mg total) by mouth daily. Take with  or immediately following [name] meal. 30 tablet 6   Multiple Vitamin (MULITIVITAMIN WITH MINERALS) TABS Take 1 tablet by mouth daily.      olmesartan (BENICAR) 40 MG tablet TAKE 1 TABLET BY MOUTH EVERY DAY 90 tablet 3   spironolactone (ALDACTONE) 25 MG tablet TAKE 1/2 TABLET BY MOUTH EVERY DAY 45 tablet 3   tamsulosin (FLOMAX) 0.4 MG CAPS capsule Take 0.4 mg by mouth daily.     zinc gluconate 50 MG tablet Take 50 mg by mouth daily.      No current facility-administered medications for this visit.    Allergies:   Patient has no known allergies.   Social History:  The patient  reports that he has never smoked. He has never used smokeless tobacco.  He reports that he does not drink alcohol and does not use drugs.   Family History:  The patient's family history includes Arthritis in his father; Diabetes in his maternal grandfather, mother, and sister; Heart disease in his father, maternal grandfather, and paternal grandfather; Hyperlipidemia in his mother; Hypertension in his maternal grandfather and mother; Obesity in his mother; Stroke in his maternal grandmother and mother.   ROS:  Please see the history of present illness.   Otherwise, review of systems is positive for none.   All other systems are reviewed and negative.   PHYSICAL EXAM: VS:  BP 136/82   Pulse 85   Ht 6\' 3"  (1.905 m)   Wt 260 lb (117.9 kg)   SpO2 97%   BMI 32.50 kg/m  , BMI Body mass index is 32.5 kg/m. GEN: Well nourished, well developed, in no acute distress  HEENT: normal  Neck: no JVD, carotid bruits, or masses Cardiac: Irregular; no murmurs, rubs, or gallops,no edema  Respiratory:  clear to auscultation bilaterally, normal work of breathing GI: soft, nontender, nondistended, + BS MS: no deformity or atrophy  Skin: warm and dry Neuro:  Strength and sensation are intact Psych: euthymic mood, full affect  EKG:  EKG is ordered today. Personal review of the ekg ordered  shows atrial fibrillation.  RBBB   Recent Labs: 01/24/2020: ALT 21 06/27/2020: BUN 37; Creatinine, Ser 1.57; Hemoglobin 14.8; Platelets 206; Potassium 4.9; Sodium 141; TSH 1.430    Lipid Panel     Component Value Date/Time   CHOL 124 06/25/2019 1014   TRIG 61 06/25/2019 1014   HDL 56 06/25/2019 1014   CHOLHDL 2.2 06/25/2019 1014   CHOLHDL 3.8 10/12/2016 0902   VLDL 17 04/14/2016 0937   LDLCALC 55 06/25/2019 1014   LDLCALC 119 (H) 10/12/2016 0902     Wt Readings from Last 3 Encounters:  06/30/20 260 lb (117.9 kg)  06/30/20 251 lb (113.9 kg)  06/27/20 262 lb (118.8 kg)      Other studies Reviewed: Additional studies/ records that were reviewed today include: TTE  02/01/2019 Review of the above records today demonstrates:   1. Left ventricular ejection fraction, by visual estimation, is 50 to 55%. The left ventricle has low normal function. There is moderately increased left ventricular hypertrophy.  2. Left ventricular diastolic function could not be evaluated.  3. Mildly dilated left ventricular internal cavity size.  4. The left ventricle has no regional wall motion abnormalities.  5. Global right ventricle has normal systolic function.The right ventricular size is normal. No increase in right ventricular wall thickness.  6. Left atrial size was mildly dilated.  7. Right atrial size was normal.  8. The mitral valve is normal in structure.  Trivial mitral valve regurgitation.  9. The tricuspid valve is normal in structure. 10. The aortic valve is tricuspid. Aortic valve regurgitation is not visualized. No evidence of aortic valve sclerosis or stenosis. 11. The pulmonic valve was grossly normal. Pulmonic valve regurgitation is not visualized. 12. Aortic dilatation noted. 13. There is mild dilatation of the ascending aorta measuring 39 mm. 14. The inferior vena cava is normal in size with greater than 50% respiratory variability, suggesting right atrial pressure of 3 mmHg.   ASSESSMENT AND PLAN:  1.  Persistent atrial fibrillation: CHA2DS2-VASc of 2.  Currently on flecainide, metoprolol, Eliquis.  High risk medication monitoring.  Prefer ablation over medical management for his atrial fibrillation.  He has surgery planned for the end of July.  We Gricelda Foland hold off on ablation until after his surgery.  As he is feeling poorly, we Otho Michalik load him on amiodarone and stop his flecainide.  We Rhylin Venters plan for cardioversion in 2 weeks.  Risk, benefits, and alternatives to EP study and radiofrequency ablation for afib were also discussed in detail today. These risks include but are not limited to stroke, bleeding, vascular damage, tamponade, perforation, damage to the  esophagus, lungs, and other structures, pulmonary vein stenosis, worsening renal function, and death. The patient understands these risk and wishes to proceed.  We Paislei Dorval therefore proceed with catheter ablation at the next available time.  Carto, ICE, anesthesia are requested for the procedure.  Lenay Lovejoy also obtain CT PV protocol prior to the procedure to exclude LAA thrombus and further evaluate atrial anatomy.   2.  Hypertension: Currently well controlled  3.  Obstructive sleep apnea: CPAP compliance encouraged  4.  Preoperative evaluation: Has plans for right hip arthroplasty on 08/12/2020.  He is able to do all of his daily activities.  He would be at low to intermediate risk for this intermediate risk procedure.  Would be okay to hold anticoagulation for 2 days prior to surgery.  Case discussed with primary cardiology  Current medicines are reviewed at length with the patient today.   The patient does not have concerns regarding his medicines.  The following changes were made today: None  Labs/ tests ordered today include:  Orders Placed This Encounter  Procedures   EKG 12-Lead      Disposition:   FU with Khrystina Bonnes 3 months  Signed, Salathiel Ferrara Clayton Leeds, MD  07/01/2020 7:52 AM     [address] [place] [address] [phone] (office) [phone] (fax)

## 2020-06-30 NOTE — Telephone Encounter (Signed)
DR Beasley 

## 2020-07-01 NOTE — Telephone Encounter (Signed)
Pt scheduled for 07/04/20

## 2020-07-02 ENCOUNTER — Telehealth: Payer: Self-pay | Admitting: Cardiology

## 2020-07-02 MED ORDER — AMIODARONE HCL 200 MG PO TABS: ORAL_TABLET | ORAL | 3 refills | Status: DC

## 2020-07-02 MED ORDER — AMIODARONE HCL 200 MG PO TABS: 200.0000 mg | ORAL_TABLET | Freq: Every day | ORAL | 1 refills | Status: DC

## 2020-07-02 NOTE — Telephone Encounter (Signed)
Returned pt call. Apologized for the mix up. Aware Amiodarone Rx sent and to start it tomorrow evening/Friday morning. Patient verbalized understanding and agreeable to plan.

## 2020-07-02 NOTE — Telephone Encounter (Signed)
Patient is requesting to speak with Venida Jarvis, RN (see 07/02/20 patient message). He is following up regarding a prescription discussed after appointment on 06/30/20. He states he was told he would be contacted with further instructions, but he hasn't heard back from RN. Please return call to patient to discuss when able.

## 2020-07-03 NOTE — Progress Notes (Signed)
Chief Complaint:   OBESITY Clayton [name] is here to discuss his progress with his obesity treatment plan along with follow-up of his obesity related diagnoses. Clayton [name] is on following a lower carbohydrate, vegetable and lean protein rich diet plan and states he is following his eating plan approximately 90% of the time. Clayton [name] states he is doing 0 minutes 0 times per week.  Today's visit was #: 52 Starting weight: 266 lbs Starting date: 11/01/2017 Today's weight: 251 lbs Today's date: 06/30/2020 Total lbs lost to date: 15 Total lbs lost since last in-office visit: 1  Interim History: Clayton [name] continues to work on diet and weight loss. He has some increased challenges with hip pain and Afib exacerbation. He has lost some muscle mass due to severe hip arthritis and he is needing a hip replacement.  Subjective:   1. Pre-diabetes Clayton [name] has done well with diet and weight loss.  2. Atrial fibrillation, unspecified type (Clayton [name]) (Cardioversion) Clayton [name] is status post cardioversion approximately 14 years ago. He notes his Afib has restarted and has not improved on medications, and he was cardioverted approximately 2 years ago. He is seeing Dr. Curt [name] again today.  Assessment/Plan:   1. Pre-diabetes Clayton [name] will continue with diet, weight loss, and decreasing simple carbohydrates to help decrease the risk of diabetes. We will refill metformin for 90 days with 1 refill.  - metFORMIN (GLUCOPHAGE) 500 MG tablet; Take 1 tablet (500 mg total) by mouth daily with breakfast.  Dispense: 90 tablet; Refill: 1  2. Atrial fibrillation, unspecified type (Clayton [name]) (Cardioversion) Clayton [name] is to continue to work on diet and weight loss, and will continue to follow up with Dr. Curt [name].  3. Obesity with current BMI 31.4 Clayton [name] is currently in the action stage of change. As such, his goal is to continue with weight loss efforts. He has agreed to following a lower carbohydrate, vegetable and lean protein rich diet plan.    Exercise goals: Exercise as tolerated.  Behavioral modification strategies: increasing lean protein intake.  Clayton [name] has agreed to follow-up with our clinic in 8 weeks. He was informed of the importance of frequent follow-up visits to maximize his success with intensive lifestyle modifications for his multiple health conditions.   Objective:   Blood pressure 119/75, pulse 67, temperature 98.3 F (36.8 C), height 6\' 3"  (1.905 m), weight 251 lb (113.9 kg), SpO2 97 %. Body mass index is 31.37 kg/m.  General: Cooperative, alert, well developed, in no acute distress. HEENT: Conjunctivae and lids unremarkable. Cardiovascular: Regular rhythm.  Lungs: Normal work of breathing. Neurologic: No focal deficits.   Lab Results  Component Value Date   CREATININE 1.57 (H) 06/27/2020   BUN 37 (H) 06/27/2020   NA 141 06/27/2020   K 4.9 06/27/2020   CL 108 (H) 06/27/2020   CO2 19 (L) 06/27/2020   Lab Results  Component Value Date   ALT 21 01/24/2020   AST 21 01/24/2020   ALKPHOS 63 01/24/2020   BILITOT 0.6 01/24/2020   Lab Results  Component Value Date   HGBA1C 5.8 (H) 08/01/2019   HGBA1C 5.8 (H) 09/04/2018   HGBA1C 6.0 (H) 11/01/2017   HGBA1C 5.4 04/14/2016   HGBA1C 6.0 06/09/2015   Lab Results  Component Value Date   INSULIN 3.9 08/01/2019   INSULIN 9.0 09/04/2018   INSULIN 22.4 11/01/2017   Lab Results  Component Value Date   TSH 1.430 06/27/2020   Lab Results  Component Value Date   CHOL 124 06/25/2019   HDL 56  06/25/2019   LDLCALC 55 06/25/2019   TRIG 61 06/25/2019   CHOLHDL 2.2 06/25/2019   Lab Results  Component Value Date   WBC 7.4 06/27/2020   HGB 14.8 06/27/2020   HCT 44.0 06/27/2020   MCV 89 06/27/2020   PLT 206 06/27/2020   Lab Results  Component Value Date   IRON 71 01/24/2020   FERRITIN 77.1 01/24/2020   Attestation Statements:   Reviewed by clinician on day of visit: allergies, medications, problem list, medical history, surgical history,  family history, social history, and previous encounter notes.  Time spent on visit including pre-visit chart review and post-visit care and charting was 42 minutes.    I, Clayton [name], am acting as Location manager for Dennard Nip, MD.  I have reviewed the above documentation for accuracy and completeness, and I agree with the above. -  Dennard Nip, MD

## 2020-07-04 ENCOUNTER — Ambulatory Visit (INDEPENDENT_AMBULATORY_CARE_PROVIDER_SITE_OTHER): Payer: Medicare PPO | Admitting: Family Medicine

## 2020-07-04 ENCOUNTER — Encounter: Payer: Self-pay | Admitting: Family Medicine

## 2020-07-04 ENCOUNTER — Other Ambulatory Visit: Payer: Self-pay

## 2020-07-04 VITALS — BP 112/74 | HR 82 | Temp 97.9°F | Ht 75.0 in | Wt 258.0 lb

## 2020-07-04 DIAGNOSIS — I10 Essential (primary) hypertension: Secondary | ICD-10-CM

## 2020-07-04 DIAGNOSIS — I4819 Other persistent atrial fibrillation: Secondary | ICD-10-CM

## 2020-07-04 DIAGNOSIS — D6869 Other thrombophilia: Secondary | ICD-10-CM | POA: Diagnosis not present

## 2020-07-04 DIAGNOSIS — Z01818 Encounter for other preprocedural examination: Secondary | ICD-10-CM | POA: Diagnosis not present

## 2020-07-04 DIAGNOSIS — R7303 Prediabetes: Secondary | ICD-10-CM

## 2020-07-04 DIAGNOSIS — Z6833 Body mass index (BMI) 33.0-33.9, adult: Secondary | ICD-10-CM | POA: Diagnosis not present

## 2020-07-04 DIAGNOSIS — G4733 Obstructive sleep apnea (adult) (pediatric): Secondary | ICD-10-CM | POA: Diagnosis not present

## 2020-07-04 DIAGNOSIS — E782 Mixed hyperlipidemia: Secondary | ICD-10-CM

## 2020-07-04 DIAGNOSIS — Z9989 Dependence on other enabling machines and devices: Secondary | ICD-10-CM

## 2020-07-04 NOTE — Telephone Encounter (Signed)
Per Dr. Curt Bears- ok to hold Eliquis 3 days prior to procedure.

## 2020-07-04 NOTE — Patient Instructions (Signed)
Good luck on your surgery!!!

## 2020-07-04 NOTE — Progress Notes (Signed)
urin    This visit occurred during the SARS-CoV-2 public health emergency.  Safety protocols were in place, including screening questions prior to the visit, additional usage of staff PPE, and extensive cleaning of exam room while observing appropriate contact time as indicated for disinfecting solutions.    Clayton [name] , 01/17/1946, 75 y.o., male MRN: [id number] Patient Care Team    Relationship Specialty Notifications Start End  Ma Hillock, DO PCP - General Family Medicine  06/09/15   [name], MD PCP - Cardiology Cardiology  10/10/18   Constance Haw, MD PCP - Electrophysiology Cardiology Admissions 02/08/19   [name], MD Consulting Physician Cardiology  06/09/15   Rana Snare, MD (Inactive) Consulting Physician Urology  06/09/15   Warden Fillers, MD Consulting Physician Ophthalmology  06/09/15   Sharyne Peach, MD Consulting Physician Ophthalmology  06/09/15   Sydnee Levans, MD Consulting Physician Dermatology  06/09/15   Vicie Mutters, MD Consulting Physician Otolaryngology  06/09/15   Paralee Cancel, MD Consulting Physician Orthopedic Surgery  06/09/15     Chief Complaint  Patient presents with   Surgical Clearance      Subjective: Pt presents for an OV  Procedure: rt hip arthroplasty Indication: arthritis  Anesthesia: Spinal [facility] Surgery type risk:   - Intermediate risk= orthopedics Prior anesthesia complications:N/a Family history of prior anesthesia complications:N/a Cardiac:    - CBD, PAD, stroke, MI, aortic stenosis : pt denies.  He has been treated for A. fib.   - METs: >4 METS Pulmonary: COPD, smoker, asthma, sleep apnea, dyspnea: sleep apnea on CPAP.  Endocrine: elevated A1c- on metformin and Farxiga Obesity:Body mass index is 32.25 kg/m. Chronic kidney disease: CKD3b GFR 46 Chronic med that needs to be continued: cardiac per cardiology instructions.  Anticoagulation: Eliquis- directions per cardio for surgery   Depression  screen [facility] 2/9 07/04/2020 11/01/2017 06/09/2015  Decreased Interest 0 1 0  Down, Depressed, Hopeless 0 0 0  PHQ - 2 Score 0 1 0  Altered sleeping - 3 -  Tired, decreased energy - 1 -  Change in appetite - 0 -  Feeling bad or failure about yourself  - 0 -  Trouble concentrating - 0 -  Moving slowly or fidgety/restless - 0 -  Suicidal thoughts - 0 -  PHQ-9 Score - 5 -  Difficult doing work/chores - Not difficult at all -    No Known Allergies Social History   Social History Narrative   Married. [name].Takes care of his mother-in-law as well.    Retired, Conservator, museum/gallery.    Drinks caffeine beverages. Takes a daily vitamin.   Exercises routinely.    Smoke detector in the home.    Ambulates independently.    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 Humphrey Rolls; CPAP titration 11/12/04- Dr. Humphrey Rolls   Stroke [facility])    Past Surgical History:  Procedure Laterality Date   CARDIAC CATHETERIZATION  11/03/05   CARDIOVERSION N/A 09/26/2018   Procedure: CARDIOVERSION;  Surgeon: Geralynn Rile, MD;  Location: [place];  Service: Endoscopy;  Laterality: N/A;   CARDIOVERSION N/A 03/13/2019   Procedure: CARDIOVERSION;  Surgeon: Skeet Latch, MD;  Location: [facility] ENDOSCOPY;  Service: Cardiovascular;  Laterality: N/A;   CATARACT EXTRACTION  2014   KNEE ARTHROSCOPY     left knee- 2002    TOTAL HIP ARTHROPLASTY  06/08/2011   Procedure: TOTAL HIP ARTHROPLASTY ANTERIOR APPROACH;  Surgeon: [name], MD;  Location: [facility] ORS;  Service: Orthopedics;  Laterality: Left;   Family History  Problem Relation Age of  Onset   Hypertension Mother    Stroke Mother    Diabetes Mother    Hyperlipidemia Mother    Obesity Mother    Diabetes Sister    Stroke Maternal Grandmother        88 died    Heart disease Maternal Grandfather    Hypertension Maternal Grandfather    Diabetes Maternal Grandfather    Arthritis Father    Heart disease Father    Heart disease Paternal Grandfather    Allergies as of 07/04/2020   No Known Allergies      Medication List        Accurate as of July 04, 2020 11:59 PM. If you have any questions, ask your nurse or doctor.          amiodarone 200 MG tablet Commonly known as: PACERONE Take 2 tablets (400 mg total) TWICE daily for 2 weeks then, take 1 tablet (200 mg total) TWICE daily for 2 weeks, then take 1 tablet ONCE daily   amiodarone 200 MG tablet Commonly known as: PACERONE Take 1 tablet (200 mg total) by mouth daily.   Coenzyme Q10 200 MG capsule Take 200 mg by mouth daily.   Eliquis 5 MG Tabs tablet Generic drug: apixaban TAKE 1 TABLET BY MOUTH TWICE A DAY   ezetimibe 10 MG tablet Commonly known as: ZETIA TAKE 1 TABLET BY MOUTH EVERY DAY   FARXIGA PO 7.5 mg.   gabapentin 100 MG capsule Commonly known as: NEURONTIN Take 2 capsules (200 mg total) by mouth at bedtime.   Livalo 2 MG Tabs Generic drug: Pitavastatin Calcium TAKE 1 TABLET BY MOUTH EVERY DAY   metFORMIN 500 MG tablet Commonly known as: GLUCOPHAGE Take 1 tablet (500 mg total) by mouth daily with breakfast.   metoprolol succinate 50 MG 24 hr tablet Commonly known as: TOPROL-XL Take 1 tablet (50 mg total) by mouth daily. Take with or immediately following a meal.   multivitamin with minerals Tabs tablet Take 1 tablet by mouth daily.   olmesartan 40 MG tablet Commonly known as: BENICAR TAKE 1 TABLET BY MOUTH EVERY DAY   PREVAGEN PO Take by mouth.   spironolactone 25 MG tablet Commonly known as: ALDACTONE TAKE 1/2 TABLET BY MOUTH EVERY DAY   tamsulosin 0.4 MG Caps  capsule Commonly known as: FLOMAX Take 0.4 mg by mouth daily.   vitamin C 1000 MG tablet Take 1,000 mg by mouth daily.   zinc gluconate 50 MG tablet Take 50 mg by mouth daily.        All past medical history, surgical history, allergies, family history, immunizations andmedications were updated in the EMR today and reviewed under the history and medication portions of their EMR.     ROS: Negative, with the exception of above mentioned in HPI   Objective:  BP 112/74   Pulse 82   Temp 97.9 F (36.6 C) (Oral)   Ht 6\' 3"  (  1.905 m)   Wt 258 lb (117 kg)   SpO2 98%   BMI 32.25 kg/m  Body mass index is 32.25 kg/m. Gen: Afebrile. No acute distress. Nontoxic in appearance, well developed, well nourished.  HENT: AT. [employer]. Bilateral TM visualized w/out effusion . MMM, no oral lesions. Bilateral nares without erythema or swelling. Throat without erythema or exudates. No cough or hoarseness. Eyes:Pupils Equal Round Reactive to light, Extraocular movements intact,  Conjunctiva without redness, discharge or icterus. Neck/lymp/endocrine: Supple, no lymphadenopathy, no thyromegaly.  CV: irregularly irregular, no murmur, no edema Chest: CTAB, no wheeze or crackles. Good air movement, normal resp effort.  Abd: Soft.NTND. BS present .no Masses palpated. No rebound or guarding.  Skin: no rashes, purpura or petechiae.  Neuro: Normal gait. PERLA. EOMi. Alert. Oriented x3  Psych: Normal affect, dress and demeanor. Normal speech. Normal thought content and judgment.  No results found. No results found. No results found for this or any previous visit (from the past 24 hour(s)).  Assessment/Plan: Clayton [name] is a 75 y.o. male present for OV for  Preop testing - Hemoglobin A1c - Protime-INR ( [facility] ONLY)  OSA on CPAP Patient is compliant with CPAP.  If overnight stay is expected CPAP should be worn.  Prediabetes A1c 5.8.  Patient is prescribed metformin and Wilder Glade (presumed for heart  protection) managed by cardiology  Persistent atrial fibrillation (HCC)/hypertension/hyperlipidemia Currently in atrial fibrillation.  Has an appointment with cardiology for cardioversion 07/14/2020. Patient should receive cardiac clearance from his cardiology team. [place] Crane these conditions are managed by cardiology  Acquired thrombophilia ([place]) PT/INR normal  Preoperative clearance Patient is likely of low risk for surgical complications to do his medical conditions.  Patient will need cardiac clearance as well since they are managing his chronic conditions.   He is A1c is well controlled at 5.8.  He is not anemic.  PT/INR normal. He does have mild kidney dysfunction with CKD 3.  Renally dose medications when appropriate.   No patient is free of risk when undergoing a procedure. The decision about whether to proceed with the operation belongs to the surgeon and the patient. Reviewed expectations re: course of current medical issues. Discussed self-management of symptoms. Outlined signs and symptoms indicating need for more acute intervention. Patient verbalized understanding and all questions were answered. Patient received an After-Visit Summary.    Orders Placed This Encounter  Procedures   Urinalysis w microscopic + reflex cultur   Protime-INR ( [facility] ONLY)   Hemoglobin A1c   REFLEXIVE URINE CULTURE   No orders of the defined types were placed in this encounter.  Referral Orders  No referral(s) requested today     Note is dictated utilizing voice recognition software. Although note has been proof read prior to signing, occasional typographical errors still can be missed. If any questions arise, please do not hesitate to call for verification.   electronically signed by:  Howard Pouch, DO  [place]

## 2020-07-07 LAB — URINALYSIS W MICROSCOPIC + REFLEX CULTURE
Bacteria, UA: NONE SEEN /HPF
Bilirubin Urine: NEGATIVE
Glucose, UA: NEGATIVE
Hgb urine dipstick: NEGATIVE
Hyaline Cast: NONE SEEN /LPF
Leukocyte Esterase: NEGATIVE
Nitrites, Initial: NEGATIVE
RBC / HPF: NONE SEEN /HPF (ref 0–2)
Specific Gravity, Urine: 1.026 (ref 1.001–1.035)
Squamous Epithelial / HPF: NONE SEEN /HPF (ref <=5)
WBC, UA: NONE SEEN /HPF (ref 0–5)
pH: 5.5 (ref 5.0–8.0)

## 2020-07-07 LAB — PROTIME-INR
INR: 1
Prothrombin Time: 10.6 s (ref 9.0–11.5)

## 2020-07-07 LAB — HEMOGLOBIN A1C
Hgb A1c MFr Bld: 5.8 % of total Hgb — ABNORMAL HIGH (ref <5.7)
Mean Plasma Glucose: 120 mg/dL
eAG (mmol/L): 6.6 mmol/L

## 2020-07-07 LAB — NO CULTURE INDICATED

## 2020-07-08 ENCOUNTER — Other Ambulatory Visit: Payer: Self-pay | Admitting: Cardiovascular Disease

## 2020-07-09 ENCOUNTER — Other Ambulatory Visit: Payer: Self-pay | Admitting: Cardiovascular Disease

## 2020-07-11 ENCOUNTER — Encounter (HOSPITAL_COMMUNITY): Payer: Self-pay | Admitting: Certified Registered Nurse Anesthetist

## 2020-07-14 ENCOUNTER — Ambulatory Visit (HOSPITAL_COMMUNITY)
Admission: RE | Admit: 2020-07-14 | Discharge: 2020-07-14 | Disposition: A | Discharge status: Home / Self Care | Payer: Medicare PPO | Attending: Cardiology | Admitting: Cardiology

## 2020-07-14 ENCOUNTER — Encounter (HOSPITAL_COMMUNITY)
Admission: RE | Disposition: A | Discharge status: Home / Self Care | Payer: Self-pay | Source: Home / Self Care | Attending: Cardiology

## 2020-07-14 ENCOUNTER — Encounter (HOSPITAL_COMMUNITY): Payer: Self-pay | Admitting: Cardiology

## 2020-07-14 DIAGNOSIS — I4510 Unspecified right bundle-branch block: Secondary | ICD-10-CM | POA: Diagnosis not present

## 2020-07-14 DIAGNOSIS — I10 Essential (primary) hypertension: Secondary | ICD-10-CM | POA: Diagnosis not present

## 2020-07-14 DIAGNOSIS — Z8349 Family history of other endocrine, nutritional and metabolic diseases: Secondary | ICD-10-CM | POA: Insufficient documentation

## 2020-07-14 DIAGNOSIS — Z7984 Long term (current) use of oral hypoglycemic drugs: Secondary | ICD-10-CM | POA: Insufficient documentation

## 2020-07-14 DIAGNOSIS — Z833 Family history of diabetes mellitus: Secondary | ICD-10-CM | POA: Diagnosis not present

## 2020-07-14 DIAGNOSIS — I4891 Unspecified atrial fibrillation: Secondary | ICD-10-CM | POA: Diagnosis present

## 2020-07-14 DIAGNOSIS — Z539 Procedure and treatment not carried out, unspecified reason: Secondary | ICD-10-CM | POA: Diagnosis not present

## 2020-07-14 DIAGNOSIS — G4733 Obstructive sleep apnea (adult) (pediatric): Secondary | ICD-10-CM | POA: Diagnosis not present

## 2020-07-14 DIAGNOSIS — Z7901 Long term (current) use of anticoagulants: Secondary | ICD-10-CM | POA: Insufficient documentation

## 2020-07-14 DIAGNOSIS — Z8673 Personal history of transient ischemic attack (TIA), and cerebral infarction without residual deficits: Secondary | ICD-10-CM | POA: Insufficient documentation

## 2020-07-14 DIAGNOSIS — I4819 Other persistent atrial fibrillation: Secondary | ICD-10-CM | POA: Insufficient documentation

## 2020-07-14 DIAGNOSIS — Z8261 Family history of arthritis: Secondary | ICD-10-CM | POA: Diagnosis not present

## 2020-07-14 DIAGNOSIS — Z8249 Family history of ischemic heart disease and other diseases of the circulatory system: Secondary | ICD-10-CM | POA: Insufficient documentation

## 2020-07-14 DIAGNOSIS — Z79899 Other long term (current) drug therapy: Secondary | ICD-10-CM | POA: Insufficient documentation

## 2020-07-14 SURGERY — CANCELLED PROCEDURE

## 2020-07-14 NOTE — H&P (Addendum)
Office Visit  06/30/2020 [place] Office  Ralston, Clayton Doyne, MD  Cardiology Persistent atrial fibrillation [facility])  Dx Referred by Ma Hillock, DO  Reason for Visit    Additional Documentation  Vitals:  BP 136/82 Pulse 85 Ht 6\' 3"  (1.905 m) Wt 117.9 kg SpO2 97% BMI 32.50 kg/m BSA 2.5 m  More Vitals  Flowsheets:  Anthropometrics, NEWS, MEWS Score   Encounter Info:  Billing Info, History, Allergies, Detailed Report    All Notes    Progress Notes by Constance Haw, MD at 06/30/2020 3:15 PM  Author: Constance Haw, MD Author Type: Physician Filed: 07/01/2020  7:52 AM  Note Status: Signed Cosign: Cosign Not Required Encounter Date: 06/30/2020  Editor: Constance Haw, MD (Physician)                   Electrophysiology Office Note     Date:  07/01/2020    ID:  Clayton [name], DOB [birth year], MRN [id number]   PCP:  Ma Hillock, DO    Cardiologist:  Claiborne Billings Primary Electrophysiologist:  Will Meredith Leeds, MD        Chief Complaint: AF   History of Present Illness: Clayton [name] is a 75 y.o. male who is being seen today for the evaluation of AF at the request of Kuneff, Renee A, DO. Presenting today for electrophysiology evaluation.   He has a history of atrial fibrillation, hypertension, sleep apnea.  He is currently on flecainide.  He has had multiple episodes of atrial fibrillation with cardioversions.  He had an episode of disorientation and anxiousness associated with transient aphasia.  This was due to mild cognitive impairment.   Today, denies symptoms of chest pain, shortness of breath, orthopnea, PND, lower extremity edema, claudication, dizziness, presyncope, syncope, bleeding, or neurologic sequela. The patient is tolerating medications without difficulties.  He currently feels weak and fatigued and mildly short of breath.  He feels that he went into atrial fibrillation a few weeks ago, and has continued to  have similar issues.  He would like to avoid long-term antiarrhythmics and would prefer ablation.         Past Medical History:  Diagnosis Date   Arthritis      hips   Atrial fibrillation (HCC)     Cataract     CPAP (continuous positive airway pressure) dependence     Dysrhythmia      PAF- hx of 7 years ago   HLD (hyperlipidemia)     Hypertension 05/29/10    ECHO-EF 67% wnl   Meniere disease     Obesity     Palpitations     Sleep apnea 09/14/04    [facility]- Dr. Humphrey Rolls; CPAP titration 11/12/04- Dr. Humphrey Rolls   Stroke [facility])           Past Surgical History:  Procedure Laterality Date   CARDIAC CATHETERIZATION   11/03/05   CARDIOVERSION N/A 09/26/2018    Procedure: CARDIOVERSION;  Surgeon: Geralynn Rile, MD;  Location: [place];  Service: Endoscopy;  Laterality: N/A;   CARDIOVERSION N/A 03/13/2019    Procedure: CARDIOVERSION;  Surgeon: Skeet Latch, MD;  Location: [facility] ENDOSCOPY;  Service: Cardiovascular;  Laterality: N/A;   CATARACT EXTRACTION   2014   KNEE ARTHROSCOPY        left knee- 2002   TOTAL HIP ARTHROPLASTY   06/08/2011    Procedure: TOTAL HIP ARTHROPLASTY ANTERIOR APPROACH;  Surgeon: [name], MD;  Location: [facility];  Service: Orthopedics;  Laterality: Left;              Current Outpatient Medications  Medication Sig Dispense Refill   Apoaequorin (PREVAGEN PO) Take by mouth.       Ascorbic Acid (VITAMIN C) 1000 MG tablet Take 1,000 mg by mouth daily.       Coenzyme Q10 200 MG capsule Take 200 mg by mouth daily.       ELIQUIS 5 MG TABS tablet TAKE 1 TABLET BY MOUTH TWICE A DAY 180 tablet 1   ezetimibe (ZETIA) 10 MG tablet TAKE 1 TABLET BY MOUTH EVERY DAY 90 tablet 3   flecainide (TAMBOCOR) 50 MG tablet Take 50 mg by mouth daily.       gabapentin (NEURONTIN) 100 MG capsule Take 2 capsules (200 mg total) by mouth at bedtime. 180 capsule 3   LIVALO 2 MG TABS TAKE 1 TABLET BY MOUTH EVERY DAY 90 tablet 3   metFORMIN (GLUCOPHAGE) 500 MG  tablet Take 1 tablet (500 mg total) by mouth daily with breakfast. 90 tablet 1   metoprolol succinate (TOPROL-XL) 50 MG 24 hr tablet Take 1 tablet (50 mg total) by mouth daily. Take with or immediately following a meal. 30 tablet 6   Multiple Vitamin (MULITIVITAMIN WITH MINERALS) TABS Take 1 tablet by mouth daily.       olmesartan (BENICAR) 40 MG tablet TAKE 1 TABLET BY MOUTH EVERY DAY 90 tablet 3   spironolactone (ALDACTONE) 25 MG tablet TAKE 1/2 TABLET BY MOUTH EVERY DAY 45 tablet 3   tamsulosin (FLOMAX) 0.4 MG CAPS capsule Take 0.4 mg by mouth daily.       zinc gluconate 50 MG tablet Take 50 mg by mouth daily.        No current facility-administered medications for this visit.      Allergies:   Patient has no known allergies.    Social History:  The patient  reports that he has never smoked. He has never used smokeless tobacco. He reports that he does not drink alcohol and does not use drugs.    Family History:  The patient's family history includes Arthritis in his father; Diabetes in his maternal grandfather, mother, and sister; Heart disease in his father, maternal grandfather, and paternal grandfather; Hyperlipidemia in his mother; Hypertension in his maternal grandfather and mother; Obesity in his mother; Stroke in his maternal grandmother and mother.    ROS:  Please see the history of present illness.   Otherwise, review of systems is positive for none.   All other systems are reviewed and negative.   PHYSICAL EXAM: VS:  BP 136/82   Pulse 85   Ht 6\' 3"  (1.905 m)   Wt 260 lb (117.9 kg)   SpO2 97%   BMI 32.50 kg/m  , BMI Body mass index is 32.5 kg/m. GEN: Well nourished, well developed, in no acute distress HEENT: normal Neck: no JVD, carotid bruits, or masses Cardiac: Irregular; no murmurs, rubs, or gallops,no edema Respiratory:  clear to auscultation bilaterally, normal work of breathing GI: soft, nontender, nondistended, + BS MS: no deformity or atrophy Skin: warm and  dry Neuro:  Strength and sensation are intact Psych: euthymic mood, full affect   EKG:  EKG is ordered today. Personal review of the ekg ordered  shows atrial fibrillation.  RBBB     Recent Labs: 01/24/2020: ALT 21 06/27/2020: BUN 37; Creatinine, Ser 1.57; Hemoglobin 14.8; Platelets 206; Potassium 4.9; Sodium 141; TSH 1.430  Lipid Panel          Component Value Date/Time    CHOL 124 06/25/2019 1014    TRIG 61 06/25/2019 1014    HDL 56 06/25/2019 1014    CHOLHDL 2.2 06/25/2019 1014    CHOLHDL 3.8 10/12/2016 0902    VLDL 17 04/14/2016 0937    LDLCALC 55 06/25/2019 1014    LDLCALC 119 (H) 10/12/2016 0902           Wt Readings from Last 3 Encounters:  06/30/20 260 lb (117.9 kg)  06/30/20 251 lb (113.9 kg)  06/27/20 262 lb (118.8 kg)        Other studies Reviewed: Additional studies/ records that were reviewed today include: TTE 02/01/2019 Review of the above records today demonstrates:  1. Left ventricular ejection fraction, by visual estimation, is 50 to 55%. The left ventricle has low normal function. There is moderately increased left ventricular hypertrophy.  2. Left ventricular diastolic function could not be evaluated.  3. Mildly dilated left ventricular internal cavity size.  4. The left ventricle has no regional wall motion abnormalities.  5. Global right ventricle has normal systolic function.The right ventricular size is normal. No increase in right ventricular wall thickness.  6. Left atrial size was mildly dilated.  7. Right atrial size was normal.  8. The mitral valve is normal in structure. Trivial mitral valve regurgitation.  9. The tricuspid valve is normal in structure. 10. The aortic valve is tricuspid. Aortic valve regurgitation is not visualized. No evidence of aortic valve sclerosis or stenosis. 11. The pulmonic valve was grossly normal. Pulmonic valve regurgitation is not visualized. 12. Aortic dilatation noted. 13. There is mild dilatation of the  ascending aorta measuring 39 mm. 14. The inferior vena cava is normal in size with greater than 50% respiratory variability, suggesting right atrial pressure of 3 mmHg.     ASSESSMENT AND PLAN:   1.  Persistent atrial fibrillation: CHA2DS2-VASc of 2.  Currently on flecainide, metoprolol, Eliquis.  High risk medication monitoring.  Prefer ablation over medical management for his atrial fibrillation.  He has surgery planned for the end of July.  We will hold off on ablation until after his surgery.  As he is feeling poorly, we will load him on amiodarone and stop his flecainide.  We will plan for cardioversion in 2 weeks.   Risk, benefits, and alternatives to EP study and radiofrequency ablation for afib were also discussed in detail today. These risks include but are not limited to stroke, bleeding, vascular damage, tamponade, perforation, damage to the esophagus, lungs, and other structures, pulmonary vein stenosis, worsening renal function, and death. The patient understands these risk and wishes to proceed.  We will therefore proceed with catheter ablation at the next available time.  Carto, ICE, anesthesia are requested for the procedure.  Will also obtain CT PV protocol prior to the procedure to exclude LAA thrombus and further evaluate atrial anatomy.     2.  Hypertension: Currently well controlled   3.  Obstructive sleep apnea: CPAP compliance encouraged   4.  Preoperative evaluation: Has plans for right hip arthroplasty on 08/12/2020.  He is able to do all of his daily activities.  He would be at low to intermediate risk for this intermediate risk procedure.  Would be okay to hold anticoagulation for 2 days prior to surgery.   Case discussed with primary cardiology   Current medicines are reviewed at length with the patient today.   The patient does  not have concerns regarding his medicines.  The following changes were made today: None   Labs/ tests ordered today include:     Orders  Placed This Encounter  Procedures   EKG 12-Lead         Disposition:   FU with Will Camnitz 3 months   Signed, Will Meredith Leeds, MD  07/01/2020 7:52 AM     [address] [place]  [phone] (office) [phone] (fax)       Pt in sinus at time of arrival; procedure canceled; continue preadmission meds. Kirk Ruths

## 2020-07-14 NOTE — Procedures (Signed)
.  Electrical Cardioversion Procedure Note Clayton [name] [id number] 07/22/1945  Procedure: Electrical Cardioversion Indications:  Atrial Fibrillation  Procedure Details Pt in sinus at time of arrival; procedure canceled; continue preadmission meds.   Kirk Ruths 07/14/2020, 7:33 AM

## 2020-07-14 NOTE — Interval H&P Note (Signed)
History and Physical Interval Note:  07/14/2020 7:30 AM  Clayton [name]  has presented today for surgery, with the diagnosis of AFIB.  The various methods of treatment have been discussed with the patient and family. After consideration of risks, benefits and other options for treatment, the patient has consented to  Procedure(s): CARDIOVERSION (N/A) as a surgical intervention.  The patient's history has been reviewed, patient examined, no change in status, stable for surgery.  I have reviewed the patient's chart and labs.  Questions were answered to the patient's satisfaction.     Kirk Ruths

## 2020-07-14 NOTE — Progress Notes (Signed)
Case canceled , pt in NSR

## 2020-07-17 ENCOUNTER — Other Ambulatory Visit: Payer: Self-pay

## 2020-07-17 ENCOUNTER — Encounter: Payer: Self-pay | Admitting: Cardiovascular Disease

## 2020-07-17 ENCOUNTER — Ambulatory Visit: Payer: Medicare PPO | Admitting: Cardiovascular Disease

## 2020-07-17 ENCOUNTER — Other Ambulatory Visit: Payer: Self-pay | Admitting: Cardiovascular Disease

## 2020-07-17 VITALS — BP 150/90 | HR 70 | Ht 72.0 in | Wt 252.0 lb

## 2020-07-17 DIAGNOSIS — I10 Essential (primary) hypertension: Secondary | ICD-10-CM | POA: Diagnosis not present

## 2020-07-17 DIAGNOSIS — G4733 Obstructive sleep apnea (adult) (pediatric): Secondary | ICD-10-CM | POA: Diagnosis not present

## 2020-07-17 DIAGNOSIS — I480 Paroxysmal atrial fibrillation: Secondary | ICD-10-CM | POA: Diagnosis not present

## 2020-07-17 DIAGNOSIS — Z79899 Other long term (current) drug therapy: Secondary | ICD-10-CM

## 2020-07-17 DIAGNOSIS — E782 Mixed hyperlipidemia: Secondary | ICD-10-CM | POA: Diagnosis not present

## 2020-07-17 DIAGNOSIS — Z7901 Long term (current) use of anticoagulants: Secondary | ICD-10-CM

## 2020-07-17 DIAGNOSIS — Z01818 Encounter for other preprocedural examination: Secondary | ICD-10-CM

## 2020-07-17 DIAGNOSIS — Z9989 Dependence on other enabling machines and devices: Secondary | ICD-10-CM | POA: Diagnosis not present

## 2020-07-17 DIAGNOSIS — Z01810 Encounter for preprocedural cardiovascular examination: Secondary | ICD-10-CM | POA: Diagnosis not present

## 2020-07-17 NOTE — Progress Notes (Signed)
Patient ID: Clayton [name], male   DOB: 06/13/1945, 75 y.o.   MRN: [id number]     HPI: Clayton [name] is a 75 y.o. male who presents for a 4 month follow-up cardiology evaluation.  Clayton [name] has a history of hypertension, a remote history of PAF, status post cardioversion in 2007, obstructive sleep apnea for which he uses CPAP 100% of the time, hypertension, and hyperlipidemia. He has had weight fluctuations over the years.  In the past, he also had a history of Mnire's disease.  There was some concern by [name] that perhaps this may have been related to statin use, which led to its discontinuance.  He is not had Mnire's disease in some time.  He underwent left hip replacement surgery by Dr. [name] and now has been able to continue to be active and exercises routinely.   His  blood pressure regimen has been amlodipine/benazepril 10/40 daily, hydrochlorothiazide has been taking 25 Mill grams in the evening and Toprol-XL 100 mg daily. He had atrial of livalo  For hyperlipidemia but developed some diarrhea secondary to this.    He has not had much success with weight loss over the past several years.    He continues to use his CPAP therapy with 100% compliance.  He will not even take a nap without his CPAP therapy and he takes his CPAP unit on alll his travels.   Since initiating CPAP therapy, he is unaware of any palpitations.  He denies any recurrent atrial fibrillation.  In the past he has had difficulty with family stress and his father had passed away, and his wife's mother recently died after having developed lung CA and had significant PVD.  He was not exercising as much as he had in the past.  He has not been successful in weight loss.  He continues to be active and still walks 3-4 miles per day.  Oftentimes in the morning when he awakens his blood pressure is elevated at 150/90 but after exercise it drops to 128/80.  He is unaware of any recurrent atrial fibrillation.  He continues  to use CPAP with 100% compliance.   He completed a two-year grieving process.  On 01/19/2016 he committed to weight loss.  On January 1, he weighed 278 pounds .  He has been walking 4 miles 5 days per week as well as some intermittent stationary bike.  He has lost a total of 32 pounds since January.  He continues to use CPAP with 100% compliance and since he has been on CPAP, he has not had any recurrent atrial fibrillation and his blood pressure has been less labile.  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  He has had failures to Crestor and Lipitor in the past due to significant myalgias, development, even with weekly dosing.  I resumed zetia   In August 2018 he noticed his heart rate increasing and he presented for evaluation and was seen by [name], PAC.  He admitted to a rare palpitation.  He denied any chest pain with exertion or dyspnea on exertion.  He has suffered a broken rib.  This year.  He has a weight goal of 220 pounds.    I saw him in September 2018, at which time he was doing well from a cardiovascular standpoint.   His father was ill and ultimately passed away in [place].  As result he was going back and forth to the [facility] area.  This resulted in  a change in his diet and ability to exercise.  I last saw him in October 2019 and his weight had increased from 240 back up to 270 pounds.  He was unaware of any episodes of atrial fibrillation.  He was using CPAP with 100% compliance along with his naps.  He was committed to begin weight loss again.    Since I saw him on October 19, 2018 he has had major lifestyle change.  He has been working with Dr. Leafy Ro in the healthy weight and wellness group.  Peak weight was 280 and most recent weight 244.  I saw him for a cardiology evaluation on August 09, 2018.  O ver the past 4 months, he had been under increased stress as result of his wife's illness.  He has been checking his blood pressure regularly and this has been stable  but on his blood pressure recording he has noticed the message of heart rate irregularity.  He is unaware of A. fib but has noticed this message for the last 3 to 4 months.  He is exercising every day and typically walks 2 miles in the morning and in the afternoon or evening does 30 minutes of stationary bike at least 5 to 6 days/week.  He continues to use CPAP.  I obtained a download in the office today from June 22 through August 08, 2018.  He is 100% compliant.  He has a ResMed air sense 10 auto unit with a pressure range of 8-20 with 95% pressure at 10.1 and maximum average and 11.5.  AHI is excellent at 1.2.     During his August 09, 2018 encounter his ECG demonstrated that he was back in atrial fibrillation and had a ventricular rate at 89 bpm.  There was LVH with repolarization changes.  At that time, I recommended discontinuance of amlodipine/benazepril combination and in its place started Cardizem CD 180 mg rather than amlodipine and olmesartan 20 mg rather than benazepril.  I initiated anticoagulation with Eliquis 5 mg twice a day.  He underwent a 2D echo Doppler study on August 16, 2018 which showed EF low normal at 50 to 55%.  There was evidence for significant LVH and moderate dilation of his left atrium.  I saw him for follow-up evaluation on August 23, 2018.  At that time he was now cognizant of his heart rate irregularity.  He has continued to use CPAP with excellent compliance.  A new download was obtained from July 5 through August 21, 2018 which shows 100% compliance with average usage 7 hours and 28 minutes.  AHI is 1.6 and his 95th percentile auto pressure is 10.1 with a maximum average pressure of 11.4.  He states he has lost weight over the past several weeks purposefully.  He continues to exercise now on a stationary bike.  He denies any chest pain PND orthopnea.  He was tolerating Eliquis without bleeding.  During that evaluation, we discussed different options including several additional  weeks of increased weight control versus initiating antiarrhythmic therapy.  Since his only other episode of atrial fibrillation previously occurred 13 years ago and he had not had any recurrence until this year he opted for an increased rate control trial.  As result metoprolol dose was further titrated.  I saw on September 15, 2018 for follow-up evaluation.  At that time remained in atrial fibrillation despite his increase metoprolol succinate to 100 mg in the morning and 50 mg at night with continuation of diltiazem 180 mg,  spironolactone 12.5 mg daily in addition to his olmesartan 20 mg.  I scheduled him for DC cardioversion but because of the need to obtain a COVID test I was unable to schedule this to be done by me prior to going on vacation.  He ultimately underwent the successful cardioversion by Dr. Davina Poke successfully and received 1 shock of 200 J of biphasic synchronized rhythm with restoration of sinus rhythm.  Chemistry profile done prior to the cardioversion revealed his creatinine had increased to 1.69 and as result I recommended he reduce his olmesartan down to 10 mg from his dose of 20 mg.  I saw him on October 10, 2018 in follow-up of his cardioversion.  At that time he was maintaining sinus rhythm and feeling well.  He had more energy.  His stress level had significantly reduced since his wife had successful L5-S1 back surgery by Dr. Vertell Limber.  He was continuing to use CPAP with 100% compliance.    I evaluated him on January 09, 2019.  At that time he stated that over the 3 months previous he had continued to feel well and remained asymptomatic.  At times there are still periods of increased stress.  He continued to use CPAP with 100% compliance.  A download was obtained from November 22 through January 08, 2019 which showed 100% compliance.  Average usage was 6 hours 47 minutes.  AHI is excellent at 1.4 with his AutoSet CPAP minimum pressure set at 8 and maximum of 20, with 95th percentile  pressure 10.2 with maximum average pressure 12.0.  During his evaluation, his ECG verify that he was back in atrial fibrillation.  At the time, he did not inform me that he had noted some irregularity but retrospectively this may have been going on for several weeks prior to that evaluation.  He was continuing to walk daily and exercising on his bike and was asymptomatic without shortness of breath.  During that evaluation I had a long discussion with him regarding possible EP evaluation for consideration of ablation.  After his significant discussion elected to initiate an attempt at antiarrhythmic therapy with low-dose flecainide initially at 50 mg twice a day with plans for office visit in 2 weeks.  He has continued to be on Eliquis for anticoagulation in addition to Zetia for his hyperlipidemia.  When I saw him on January 24, 2019 he stated that he had felt some fatigue once flecainide was instituted.  He was feeling well but this past Sunday he had been working very hard going up and down numerous steps carrying boxes with heavy exertion making at least 40 trips carrying Christmas girls back upstairs.  He was tired.  Sunday evening while sitting down he became disoriented anxious and it appeared that there was possibly some transient stress of aphasia.  He denied any focal weakness.  His symptoms ultimately resolved after 30 minutes.  He had called the office during the workweek and was concerned about his symptoms possibly secondary to flecainide.  He had self reduced his flecainide dose to just once a day rather than twice a day and is worked into my schedule today for follow-up evaluation.  Presently, he has no residual issues since that event several days ago.    At his January 2021 evaluation, I recommended he undergo a neurologic evaluation with Dr. Leonie Man as well as discussed an EP evaluation for possible consideration of future atrial fibrillation ablation.  He saw Dr. Curt Bears on February 08, 2019 and  was in  persistent atrial fibrillation.  Was discussion concerning continuing flecainide with subsequent high voltage cardioversion.  He saw Dr. Leonie Man February 27, 2019 and after a long evaluation it was felt that his transient episode of word finding difficulty and anomia likely represented mild cognitive impairment rather than TIA or seizure.  He subsequently saw Dr Curt Bears back on March 05, 2019 and on 323 he underwent successful cardioversion with Dr. Oval Linsey which required 3 shocks with restoration of sinus rhythm.  He was evaluated by me in March 2021 at which time he felt his heart rhythm had remained stable.    He was under considerable increased stress to his wife had fallen in sustained a fracture to her humerus as well as tear to her rotator cuff and biceps tendon.  Ul diltiazem was discontinued and he was started on amlodipine 5 mg and was told to continue olmesartan 20 mg in addition to his metoprolol and spironolactone.  Timately she underwent surgery.  He denies chest pain PND orthopnea.  He has continued to use CPAP download from March 05, 2019 through April 03, 2019 continues to show excellent compliance with an AHI of 2.7 and 95th percentile pressure 11.4 cm.   Since I last saw him, he was evaluated by Roby Lofts June 20, 2019.  At that time, he was getting to stroke with his CPAP but seen for the first 10 minutes before he was able to settle down and fall asleep.  He denies chest pain or shortness of breath.  He had noted some elevated blood pressures as well as some dyspnea on exertion.  During that evaluation his diltiazem was discontinued and he was started on amlodipine 5 mg.  He was told to continue his present dose of olmesartan, metoprolol, spironolactone.  During that evaluation he was maintaining sinus rhythm but had bradycardia and first-degree heart block.  Flecainide was reduced down to 50 mg twice a day since his symptoms of fatigability and dyspnea seem to occur at the  higher dose.  With return to a lower dose his symptoms resolved.  I  evaluated him in June 2021 over the prior 3 months he denied any recurrent episodes of chest pain.    He continues to have issues in the first 10 minutes of his CPAP use which I suspect may be related to not having a ramp time on his machine which can easily be adjusted.  His blood pressure does continue to be somewhat labile but he states at home most of the time it is controlled.  He was unaware of any recurrent episodes of atrial fibrillation.  He denied presyncope or syncope.    He underwent a nuclear stress test which apparently had been ordered for dyspnea on exertion.  This was low risk study and demonstrated a hypertensive response to exercise.  A defect was felt to be present in the inferior wall which was interpreted as scar with mild peri-infarct ischemia, which may also have been exacerbated by his body habitus and diaphragmatic attenuation.  Currently he remains asymptomatic and denies chest pain or shortness of breath.  He has been evaluated by Dr. Hermina Barters for left cochlear Mnire's disease.  He underwent audiogram which showed mild to moderate severity high-frequency sensorineural neural hearing loss in the right ear and mild to moderately severe downsloping sensorineural neural hearing loss in the left ear.   I saw him in October 2021 at which time he denied any recurrent episodes of atrial fibrillation.  He  continued to  be on amlodipine 5 mg, metoprolol XL 100 mg in the evening with 25 or 50 mg in the morning, olmesartan 40 mg, and spironolactone 12.5 mg daily.  He was on Eliquis for anticoagulation.  He wastolerating Livalo 2 mg and Zetia for hyperlipidemia with LDL cholesterol at 55 in June 2021.  He tries to play golf several times a week if possible and continues to score well.  He continues to be followed by Dr. Dennard Nip with plans for continued weight loss.  I last saw him in February 2022 and since his  prior evaluation he was having issues with his knees.   He had undergone removal of his meniscus.  He has been followed by Charlann Boxer he recently had a hyaluronic acid injection with benefit.  He also has undergone evaluation with Dr. Ihor Gully with discomfort in his right hip.  He has low back issues at L5-S1 and is now on gabapentin.  He is unaware of recurrent atrial fibrillation.  Apparently he is was only taking flecainide 50 mg daily instead of twice a day.  He continued to be on amlodipine 7.5 mg, Eliquis 5 mg twice a day, metoprolol succinate 150 mg in the evening and spironolactone 12.5 mg daily.  At times he he has some mild blood pressure elevation.  He continued to be evaluated at healthy weight and wellness.  Because of his knee and hip issues he has not been playing as much golf as he had in the past.  He continuesd to use CPAP with 100% compliance. A download was obtained from January 20 through March 07, 2020.  He is averaging 7 hours and 2 minutes of CPAP use per night.  AHI is 1.5 with a 95th percentile pressure at 11.5 and maximum average pressure at 13.  During that evaluation, his QTc interval was prolonged at 497 and rather than further increase flecainide I recommended titration of metoprolol to 50 mg in the morning and 100 mg at night.  He continued to be on Livalo 2 mg daily and Zetia for hyperlipidemia.  Since I saw him, he developed an episode of recurrent atrial fibrillation and was seen by Jory Sims, NP on June 27, 2020 and was scheduled to see Dr. Curt Bears on June 30, 2020.  During that evaluation, his flecainide was discontinued and amiodarone 400 mg twice a day was initiated for 2 weeks with plans to reduce to 400 mg for 2 weeks and then 200 mg daily.  He was scheduled to undergo DC cardioversion on July 14 2020 and upon arrival for the procedure he was found to have converted to sinus rhythm and cardioversion was not performed.  When seen by Dr. Curt Bears, the patient had already  a scheduled right hip replacement surgery to be done by Dr. [name] on August 12, 2020.  Dr. Curt Bears has scheduled Clayton [name] to undergo cardioversion in late August 2022.  He presents for evaluation.   Past Medical History:  Diagnosis Date   Arthritis    hips    Atrial fibrillation (HCC)    Cataract    Cochlear Meniere syndrome of left ear 06/09/2015   CPAP (continuous positive airway pressure) dependence    Dysrhythmia    PAF- hx of 7 years ago    HLD (hyperlipidemia)    Hypertension 05/29/10   ECHO-EF 67% wnl   Meniere disease    Obesity    Palpitations    Sleep apnea 09/14/04   [facility]- Dr. Humphrey Rolls; CPAP  titration 11/12/04- Dr. Humphrey Rolls   Stroke [facility])     Past Surgical History:  Procedure Laterality Date   CARDIAC CATHETERIZATION  11/03/05   CARDIOVERSION N/A 09/26/2018   Procedure: CARDIOVERSION;  Surgeon: Geralynn Rile, MD;  Location: [facility];  Service: Endoscopy;  Laterality: N/A;   CARDIOVERSION N/A 03/13/2019   Procedure: CARDIOVERSION;  Surgeon: Skeet Latch, MD;  Location: [facility] ENDOSCOPY;  Service: Cardiovascular;  Laterality: N/A;   CATARACT EXTRACTION  2014   KNEE ARTHROSCOPY     left knee- 2002    TOTAL HIP ARTHROPLASTY  06/08/2011   Procedure: TOTAL HIP ARTHROPLASTY ANTERIOR APPROACH;  Surgeon: [name], MD;  Location: [facility];  Service: Orthopedics;  Laterality: Left;    No Known Allergies   Current Outpatient Medications  Medication Sig Dispense Refill   amiodarone (PACERONE) 200 MG tablet Take 2 tablets (400 mg total) TWICE daily for 2 weeks then, take 1 tablet (200 mg total) TWICE daily for 2 weeks, then take 1 tablet ONCE daily 84 tablet 3   Ascorbic Acid (VITAMIN C) 1000 MG tablet Take 1,000 mg by mouth in the morning.     Coenzyme Q10 200 MG capsule Take 200 mg by mouth in the morning.     ELIQUIS 5 MG TABS tablet TAKE 1 TABLET BY MOUTH TWICE A DAY (Patient taking differently: Take 5 mg by mouth 2 (two) times daily.) 180  tablet 1   ezetimibe (ZETIA) 10 MG tablet TAKE 1 TABLET BY MOUTH EVERY DAY (Patient taking differently: Take 10 mg by mouth in the morning.) 90 tablet 3   gabapentin (NEURONTIN) 100 MG capsule Take 2 capsules (200 mg total) by mouth at bedtime. 180 capsule 3   LIVALO 2 MG TABS TAKE 1 TABLET BY MOUTH EVERY DAY (Patient taking differently: Take 2 mg by mouth every evening.) 90 tablet 3   metFORMIN (GLUCOPHAGE) 500 MG tablet Take 1 tablet (500 mg total) by mouth daily with breakfast. (Patient taking differently: Take 500 mg by mouth in the morning.) 90 tablet 1   metoprolol succinate (TOPROL-XL) 100 MG 24 hr tablet Take 100 mg by mouth every evening. Take with or immediately following a meal.     metoprolol succinate (TOPROL-XL) 50 MG 24 hr tablet Take 1 tablet (50 mg total) by mouth daily. Take with or immediately following a meal. (Patient taking differently: Take 50 mg by mouth in the morning. Take with or immediately following a meal.) 30 tablet 6   olmesartan (BENICAR) 40 MG tablet TAKE 1 TABLET BY MOUTH EVERY DAY (Patient taking differently: Take 40 mg by mouth in the morning.) 90 tablet 3   spironolactone (ALDACTONE) 25 MG tablet TAKE 1/2 TABLET BY MOUTH EVERY DAY (Patient taking differently: Take 12.5 mg by mouth in the morning.) 45 tablet 3   tamsulosin (FLOMAX) 0.4 MG CAPS capsule Take 0.4 mg by mouth in the morning.     zinc gluconate 50 MG tablet Take 50 mg by mouth in the morning.     amLODipine (NORVASC) 5 MG tablet TAKE 1.5 TABLETS BY MOUTH DAILY. 135 tablet 2   No current facility-administered medications for this visit.    Socially he is remarried after his first wife passed away. He has 2 children. He is retired  and was the Building control surveyor at [employer]. there is no tobacco history.  He plays golf at least 2 days/week and this past year not only did he have a hole in 1 but he has also shot his  age.  ROS General: Negative; No fevers, chills, or night sweats;  HEENT: Negative;  No changes in vision or hearing, sinus congestion, difficulty swallowing Pulmonary: Negative; No cough, wheezing, shortness of breath, hemoptysis Cardiovascular: See HPI GI: Negative; No nausea, vomiting, diarrhea, or abdominal pain GU: Negative; No dysuria, hematuria, or difficulty voiding Musculoskeletal: He is status post rib fracture.  He is status post hip replacement. Hematologic/Oncology: Negative; no easy bruising, bleeding Endocrine: Negative; no heat/cold intolerance; no diabetes Neuro: No vertigo or imbalance issues Skin: Negative; No rashes or skin lesions Psychiatric: Negative; No behavioral problems, depression Sleep: Positive for obstructive sleep apnea on CPAP therapy with 100% compliance No snoring, daytime sleepiness, hypersomnolence, bruxism, restless legs, hypnogognic hallucinations, no cataplexy Other comprehensive 14 point system review is negative.  PE BP (!) 150/90   Pulse 70   Ht 6' (1.829 m)   Wt 252 lb (114.3 kg)   SpO2 96%   BMI 34.18 kg/m    Repeat blood pressure by me 140/78  Wt Readings from Last 3 Encounters:  07/17/20 252 lb (114.3 kg)  07/04/20 258 lb (117 kg)  06/30/20 260 lb (117.9 kg)  In 2021 he had lost approximately 40 pounds  General: Alert, oriented, no distress.  Skin: normal turgor, no rashes, warm and dry HEENT: Normocephalic, atraumatic. Pupils equal round and reactive to light; sclera anicteric; extraocular muscles intact;  Nose without nasal septal hypertrophy Mouth/Parynx benign; Mallinpatti scale 3 Neck: No JVD, no carotid bruits; normal carotid upstroke Lungs: clear to ausculatation and percussion; no wheezing or rales Chest wall: without tenderness to palpitation Heart: PMI not displaced, RRR, s1 s2 normal, 1/6 systolic murmur, no diastolic murmur, no rubs, gallops, thrills, or heaves Abdomen: soft, nontender; no hepatosplenomehaly, BS+; abdominal aorta nontender and not dilated by palpation. Back: no CVA tenderness Pulses  2+ Musculoskeletal: full range of motion, normal strength, no joint deformities Extremities: no clubbing cyanosis or edema, Homan's sign negative  Neurologic: grossly nonfocal; Cranial nerves grossly wnl Psychologic: Normal mood and affect   ECG (independently read by me): NSR at 70; RBBB, 1st degree AV block; PR 220 msec; QTc 492 msec  March 10, 2020 ECG (independently read by me): NSR at 82; RBBB with repoarization, QTc 497 msec  November 06, 2019 ECG (independently read by me): Sinus rhythm at 64; 1st degree AV block; PR 212 msec;RBBB  July 09, 2019 ECG (independently read by me): Normal sinus rhythm at 70 bpm, first-degree AV block with a.  PR interval at 212 ms.  Right bundle branch block with repolarization changes.  Borderline LVH by voltage criteria.  QTc interval 470 ms.  April 04, 2019 ECG (independently read by me): Sinus bradycardia 59 bpm, first-degree AV block with a PR of 248 ms.  LVH.  Inferior Q waves, unchanged  January 24, 2019 ECG (independently read by me): Atrial fibrillation at 83 bpm.  Borderline LVH by voltage criteria in aVL; QTc interval 446 ms.  January 09, 2019 ECG (independently read by me): Atrial fibrillation ar 97, RBBB; Left axis deviation; Inferior Q waves, unchanged. QTc 487    August 09, 2018 ECG (independently read by me): Normal sinus rhythm at 72 bpm.  Left Axis deviation.  QTc interval 429 ms.  September 15, 2018 ECG (independently read by me): Atrial fibrillation at 92 bpm.  Right bundle branch block with repolarization changes.  August 23, 2018 ECG (independently read by me): Atrial fibrillation at 96 bpm.  LVH with repolarization changes.  Left axis deviation.  Left  anterior hemiblock.  Inferior Q waves  August 09, 2018 ECG (independently read by me):Atrial fibrillatoion at 32; LVH with repolarization  October 219 ECG (independently read by me): Normal sinus rhythm at 81 bpm.  Nonspecific intraventricular conduction delay.  Left axis  deviation  September 2018 ECG (independently read by me): Normal sinus rhythm at 63 bpm.  Left bundle branch block with repolarization changes.  Normal intervals.  No ectopy.  April 2018 ECG (independently read by me): Normal sinus rhythm at 68 bpm.  LVH with repolarization.  Normal intervals.  November 2017 ECG (independently read by me): Normal sinus rhythm at 78 bpm.  Nonspecific interventricular delay.  QRS complex V1 V2.  August 2016 ECG (independently read by me): Normal sinus rhythm at 77 bpm.  LVH with repolarization changes.  Left axis deviation.  December 2015 ECG (independently read by me): Normal sinus rhythm at 87 bpm.  LVH by voltage criteria with T-wave changes in leads 1 and aVL.  July 2014 ECG: Normal sinus rhythm at 76 beats per minute. LVH with QRS widening.  LABS:  BMP Latest Ref Rng & Units 06/27/2020 01/24/2020 01/24/2020  Glucose 65 - 99 mg/dL 96 - 109(H)  BUN 8 - 27 mg/dL 37(H) - 30(H)  Creatinine 0.76 - 1.27 mg/dL 1.57(H) - 1.40  BUN/Creat Ratio 10 - 24 24 - -  Sodium 134 - 144 mmol/L 141 - 139  Potassium 3.5 - 5.2 mmol/L 4.9 - 4.3  Chloride 96 - 106 mmol/L 108(H) - 106  CO2 20 - 29 mmol/L 19(L) - 28  Calcium 8.6 - 10.2 mg/dL 9.8 10.2 10.1   Hepatic Function Latest Ref Rng & Units 01/24/2020 06/25/2019 01/09/2019  Total Protein 6.0 - 8.3 g/dL 7.3 6.5 6.6  Albumin 3.5 - 5.2 g/dL 4.5 4.5 4.5  AST 0 - 37 U/L 21 20 30   ALT 0 - 53 U/L 21 20 31   Alk Phosphatase 39 - 117 U/L 63 62 64  Total Bilirubin 0.2 - 1.2 mg/dL 0.6 0.8 0.8   CBC Latest Ref Rng & Units 06/27/2020 01/24/2020 03/13/2019  WBC 3.4 - 10.8 x10E3/uL 7.4 3.9(L) -  Hemoglobin 13.0 - 17.7 g/dL 14.8 14.3 15.6  Hematocrit 37.5 - 51.0 % 44.0 42.6 46.0  Platelets 150 - 450 x10E3/uL 206 221.0 -   Lab Results  Component Value Date   MCV 89 06/27/2020   MCV 89.7 01/24/2020   MCV 90 03/05/2019   Lab Results  Component Value Date   TSH 1.430 06/27/2020    Lab Results  Component Value Date   HGBA1C 5.8 (H)  07/04/2020    Lipid Panel     Component Value Date/Time   CHOL 124 06/25/2019 1014   TRIG 61 06/25/2019 1014   HDL 56 06/25/2019 1014   CHOLHDL 2.2 06/25/2019 1014   CHOLHDL 3.8 10/12/2016 0902   VLDL 17 04/14/2016 0937   LDLCALC 55 06/25/2019 1014   LDLCALC 119 (H) 10/12/2016 0902     RADIOLOGY: No results found.  IMPRESSION:  1. Paroxysmal atrial fibrillation (HCC)   2. Primary hypertension   3. Mixed hyperlipidemia   4. OSA on CPAP   5. Essential hypertension   6. Anticoagulated   7. Preoperative clearance     ASSESSMENT AND PLAN: Mr [name] is a 75 year old gentleman who has a long-standing history of hypertension and remotely had been on amlodipine/benazepril 10/40, HCTZ 25 mg, Toprol-XL 100 mg in addition to spironolactone 12.5 mg daily.  He had had a prior isolated  episode of atrial fibrillation in 2007 and had undergone cardioversion.  On this regimen his blood pressure had stabilized and consistently was well controlled and he had not had any recurrent episodes of atrial fibrillation.   He had gained significant amount of weight since August 2019.  He initiated a major lifestyle change and has been working closely with Dr. Leafy Ro from [facility] resulting in a greater than 40 pound weight loss in 2021.   When I saw him in late July 2020 he was in atrial fibrillation of questionable duration.  At the time retrospectively he believes he may have been in A. fib for 3 to 4 months duration.  I gradually titrated his medical regimen and changed his benazepril to diltiazem and olmesartan to take in addition to metoprolol succinate and his spironolactone 12.5 mg daily.  He had continued to use CPAP with 100% compliance.  His only previous episode of A. fib prior to this episode had occurred in 2007 and he had maintained sinus rhythm until this past year.  He underwent successful cardioversion on September 26, 2018 with restoration of sinus rhythm.  When I saw  him on January 09, 2019 he was continuing to have periods of increased stress ECG again showed recurrent atrial fibrillation of questionable duration.  Retrospectively he felt he may have been in atrial fibrillation for 3 to 4 weeks duration prior to that visit.  He also had an episode of disorientation with possible transient expressive aphasia and underwent neurologic evaluation with Dr. Leonie Man and also EP evaluation with Dr. Curt Bears.  He was felt that his word finding difficulty and anomia most likely represented mild cognitive impairment rather than a TIA or stroke.  He had been on increased dose of flecainide up to 100 mg twice a day after his subsequent cardioversion.  His last echo Doppler study in January 2021 showed an EF of 50 to 55% without regional wall motion abnormalities.  When I last saw him in February 2022 he was asymptomatic without chest pain or shortness of breath and was maintaining sinus rhythm.  He has subsequently reverted back to atrial fibrillation and his flecainide dose was discontinued and he was started on amiodarone initially 40 mg twice a day when seen by Dr. Curt Bears on June 30, 2020.  He had pharmacologically converted to sinus rhythm when he presented for outpatient DC cardioversion on February 14, 2020.  ECG today confirms he is maintaining sinus rhythm.  His fatigue has significantly improved with restoration of sinus rhythm.  He continues to be on Eliquis for anticoagulation.  His blood pressure is mildly increased but he states his blood pressure at home typically runs around 125/75.  He continues to be on amlodipine 7.5 mg, metoprolol succinate 50 mg in the morning and 100 mg in the evening, olmesartan 40 mg, and spironolactone 12.5 mg daily.  He continues to be on Livalo 2 mg and Zetia 10 mg for hyperlipidemia.  He is diabetic on metformin.  He continues to use CPAP therapy with excellent compliance.  I obtained a download from May 31 through July 16, 2020 which shows 100%  compliance.  AHI is excellent at 1.7 with his 95th percentile pressure at 10.7 with maximum average pressure 12.7.  He will be undergoing right hip replacement with Dr. Adriana Mccallum I recommended that he hold Eliquis for at least 48 hours prior to the procedure.  He has me my thoughts concerning if he continues to be well controlled with  amiodarone should he undergo planned ablation tentatively scheduled for the end of August.  I have suggested that he have a follow-up appointment to see Dr. Curt Bears at the beginning of August prior to his planned ablation procedure for further discussion and evaluation.  I will see him in 6 months for follow-up evaluation   [name], MD, [facility]  07/17/2020 6:35 PM

## 2020-07-17 NOTE — Patient Instructions (Signed)
Medication Instructions:  Per Dr. Claiborne Billings please HOLD your Eliquis for 48 hours before your surgery.   *If you need a refill on your cardiac medications before your next appointment, please call your pharmacy*   Lab Work: CMET, CBC, TSH, Lipid to be drawn FASTING 2-3 weeks prior to your procedure.   If you have labs (blood work) drawn today and your tests are completely normal, you will receive your results only by: [place] (if you have MyChart) OR A paper copy in the mail If you have any lab test that is abnormal or we need to change your treatment, we will call you to review the results.   Testing/Procedures: None ordered.    Follow-Up: At [facility], you and your health needs are our priority.  As part of our continuing mission to provide you with exceptional heart care, we have created designated Provider Care Teams.  These Care Teams include your primary Cardiologist (physician) and Advanced Practice Providers (APPs -  Physician Assistants and Nurse Practitioners) who all work together to provide you with the care you need, when you need it.  We recommend signing up for the patient portal called "MyChart".  Sign up information is provided on this After Visit Summary.  MyChart is used to connect with patients for Virtual Visits (Telemedicine).  Patients are able to view lab/test results, encounter notes, upcoming appointments, etc.  Non-urgent messages can be sent to your provider as well.   To learn more about what you can do with MyChart, go to [email or web address].    Your next appointment:   Follow up with Dr. Curt Bears in the first week of August, See Dr. Claiborne Billings in 6 months.   The format for your next appointment:   In Person  Provider:   Shelva Majestic, MD

## 2020-07-22 ENCOUNTER — Other Ambulatory Visit: Payer: Self-pay

## 2020-07-22 ENCOUNTER — Encounter: Payer: Self-pay | Admitting: Family Medicine

## 2020-07-22 ENCOUNTER — Ambulatory Visit: Payer: Medicare PPO | Admitting: Family Medicine

## 2020-07-22 VITALS — BP 138/82 | HR 59 | Ht 72.0 in | Wt 258.0 lb

## 2020-07-22 DIAGNOSIS — M9902 Segmental and somatic dysfunction of thoracic region: Secondary | ICD-10-CM | POA: Diagnosis not present

## 2020-07-22 DIAGNOSIS — M1611 Unilateral primary osteoarthritis, right hip: Secondary | ICD-10-CM | POA: Diagnosis not present

## 2020-07-22 DIAGNOSIS — M9904 Segmental and somatic dysfunction of sacral region: Secondary | ICD-10-CM | POA: Diagnosis not present

## 2020-07-22 DIAGNOSIS — M9903 Segmental and somatic dysfunction of lumbar region: Secondary | ICD-10-CM

## 2020-07-22 NOTE — Assessment & Plan Note (Signed)
Arthritis of the right hip.  Having replacement.  Likely making the spinal stenosis of the lumbar spine bilaterally worse as well.  Patient is responding well to steroids and manipulation.  We will hold until patient is released to follow-up again to start manipulation by his orthopedic surgeon.  We will have a follow-up appointment set up at a later date.

## 2020-07-22 NOTE — Progress Notes (Signed)
[place] [address] [place] Phone: [phone] Subjective:   I Clayton [name] am serving as a Education administrator for Dr. Hulan Saas.  This visit occurred during the SARS-CoV-2 public health emergency.  Safety protocols were in place, including screening questions prior to the visit, additional usage of staff PPE, and extensive cleaning of exam room while observing appropriate contact time as indicated for disinfecting solutions.   I'm seeing this patient by the request  of:  Kuneff, Renee A, DO  CC: Neck and back pain follow-up  MHD:[id number]  Clayton [name] is a 75 y.o. male coming in with complaint of back and neck pain. OMT 06/17/2020. Patient states the hip is still bothering him. Will do replacement surgery on the 26th.   Medications patient has been prescribed: Gabapentin  Taking:       Patient appears scheduled for a total hip arthroplasty on August 12, 2020  Reviewed prior external information including notes and imaging from previsou exam, outside providers and external EMR if available.   As well as notes that were available from care everywhere and other healthcare systems.  Past medical history, social, surgical and family history all reviewed in electronic medical record.  No pertanent information unless stated regarding to the chief complaint.   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 Humphrey Rolls; CPAP titration 11/12/04- Dr. Humphrey Rolls   Stroke [facility])     No Known Allergies   Review of Systems:  No headache, visual changes, nausea, vomiting,  diarrhea, constipation, dizziness, abdominal pain, skin rash, fevers, chills, night sweats, weight loss, swollen lymph nodes,  joint swelling, chest pain, shortness of breath, mood changes. POSITIVE muscle aches, body aches  Objective  Blood pressure 138/82, pulse (!) 59, height 6' (1.829 m), weight 258 lb (117 kg), SpO2 97 %.   General: No apparent distress alert and oriented x3 mood and affect normal, dressed appropriately.  HEENT: Pupils equal, extraocular movements intact  Respiratory: Patient's speak in full sentences and does not appear short of breath  Cardiovascular: No lower extremity edema, non tender, no erythema  Antalgic gait favoring the right hip Low back exam shows patient does have a lordosis.  Tenderness to palpation of the paraspinal musculature of the lumbar spine.   Osteopathic findings  T9 extended rotated and side bent left L2 flexed rotated and side bent right Sacrum right on right     Assessment and Plan:  Arthritis of right hip Arthritis of the right hip.  Having replacement.  Likely making the spinal stenosis of the lumbar spine bilaterally worse as well.  Patient is responding well to steroids and manipulation.  We will hold until patient is released to follow-up again to start manipulation by his orthopedic surgeon.  We will have a follow-up appointment set up at a later date.   Nonallopathic problems  Decision today to treat with OMT was based on Physical Exam  After verbal consent patient was treated with HVLA, ME, FPR  techniques in  rib, thoracic, lumbar, and sacral  areas  Patient tolerated the procedure well with improvement in symptoms  Patient given exercises, stretches and lifestyle modifications  See medications in patient instructions if given  Patient will follow up likely 6 to 8 weeks after hip replacement      The above documentation has been reviewed and is accurate and complete Clayton Pulley, DO        Note: This dictation  was prepared with Dragon dictation along with smaller phrase technology. Any transcriptional errors that result from this process are unintentional.

## 2020-07-22 NOTE — Patient Instructions (Addendum)
Good to see you You will do great We will play follow up by ear

## 2020-07-24 ENCOUNTER — Telehealth: Payer: Self-pay | Admitting: Cardiology

## 2020-07-24 ENCOUNTER — Encounter (HOSPITAL_COMMUNITY): Payer: Medicare PPO

## 2020-07-24 NOTE — Telephone Encounter (Signed)
Returned pt call  

## 2020-07-24 NOTE — Telephone Encounter (Signed)
     Pt is requesting to speak with Sherri, he said he needs to get information about his ablation

## 2020-07-29 ENCOUNTER — Other Ambulatory Visit: Payer: Self-pay | Admitting: Family Medicine

## 2020-08-01 DIAGNOSIS — I10 Essential (primary) hypertension: Secondary | ICD-10-CM | POA: Diagnosis not present

## 2020-08-01 DIAGNOSIS — E782 Mixed hyperlipidemia: Secondary | ICD-10-CM | POA: Diagnosis not present

## 2020-08-01 LAB — CBC
Hematocrit: 44.1 % (ref 37.5–51.0)
Hemoglobin: 14.7 g/dL (ref 13.0–17.7)
MCH: 30 pg (ref 26.6–33.0)
MCHC: 33.3 g/dL (ref 31.5–35.7)
MCV: 90 fL (ref 79–97)
Platelets: 217 10*3/uL (ref 150–450)
RBC: 4.9 x10E6/uL (ref 4.14–5.80)
RDW: 13.3 % (ref 11.6–15.4)
WBC: 5.7 10*3/uL (ref 3.4–10.8)

## 2020-08-01 LAB — LIPID PANEL
Chol/HDL Ratio: 2.2 ratio (ref 0.0–5.0)
Cholesterol, Total: 120 mg/dL (ref 100–199)
HDL: 54 mg/dL (ref >39)
LDL Chol Calc (NIH): 50 mg/dL (ref 0–99)
Triglycerides: 81 mg/dL (ref 0–149)
VLDL Cholesterol Cal: 16 mg/dL (ref 5–40)

## 2020-08-01 LAB — COMPREHENSIVE METABOLIC PANEL
ALT: 29 IU/L (ref 0–44)
AST: 25 IU/L (ref 0–40)
Albumin/Globulin Ratio: 2.3 — ABNORMAL HIGH (ref 1.2–2.2)
Albumin: 4.6 g/dL (ref 3.7–4.7)
Alkaline Phosphatase: 69 IU/L (ref 44–121)
BUN/Creatinine Ratio: 18 (ref 10–24)
BUN: 29 mg/dL — ABNORMAL HIGH (ref 8–27)
Bilirubin Total: 1.3 mg/dL — ABNORMAL HIGH (ref 0.0–1.2)
CO2: 21 mmol/L (ref 20–29)
Calcium: 10.1 mg/dL (ref 8.6–10.2)
Chloride: 103 mmol/L (ref 96–106)
Creatinine, Ser: 1.65 mg/dL — ABNORMAL HIGH (ref 0.76–1.27)
Globulin, Total: 2 g/dL (ref 1.5–4.5)
Glucose: 103 mg/dL — ABNORMAL HIGH (ref 65–99)
Potassium: 4.8 mmol/L (ref 3.5–5.2)
Sodium: 139 mmol/L (ref 134–144)
Total Protein: 6.6 g/dL (ref 6.0–8.5)
eGFR: 44 mL/min/{1.73_m2} — ABNORMAL LOW (ref >59)

## 2020-08-01 LAB — TSH: TSH: 2.14 u[IU]/mL (ref 0.450–4.500)

## 2020-08-05 MED ORDER — SPIRONOLACTONE 25 MG PO TABS: 12.5000 mg | ORAL_TABLET | ORAL | Status: DC

## 2020-08-05 NOTE — Patient Instructions (Addendum)
DUE TO COVID-19 ONLY ONE VISITOR IS ALLOWED TO COME WITH YOU AND STAY IN THE WAITING ROOM ONLY DURING PRE OP AND PROCEDURE.   **NO VISITORS ARE ALLOWED IN THE SHORT STAY AREA OR RECOVERY ROOM!!**  IF YOU WILL BE ADMITTED INTO THE HOSPITAL YOU ARE ALLOWED ONLY TWO SUPPORT PEOPLE DURING VISITATION HOURS ONLY (10AM -8PM)   The support person(s) may change daily. The support person(s) must pass our screening, gel in and out, and wear a mask at all times, including in the patient's room. Patients must also wear a mask when staff or their support person are in the room.  No visitors under the age of 7. Any visitor under the age of 51 must be accompanied by an adult.    COVID SWAB TESTING MUST BE COMPLETED ON:  Friday, 08-08-20 @    [address] [place], [place] [address]   You are not required to quarantine, however you are required to wear a well-fitted mask when you are out and around people not in your household.  Hand Hygiene often Do NOT share personal items Notify your provider if you are in close contact with someone who has COVID or you develop fever 100.4 or greater, new onset of sneezing, cough, sore throat, shortness of breath or body aches.         Your procedure is scheduled on: Tuesday, 08-12-20    Report to [facility] Main  Entrance   Report to Short Stay at 5:55 AM   [facility])    Call this number if you have problems the morning of surgery [phone]   Do not eat food :After Midnight.   May have liquids until  5:20 AM day of surgery  CLEAR LIQUID DIET  Foods Allowed                                                                     Foods Excluded  Water, Black Coffee and tea, regular and decaf              liquids that you cannot  Plain Jell-O in any flavor  (No red)                                    see through such as: Fruit ices (not with fruit pulp)                                      milk, soups, orange juice              Iced Popsicles (No  red)                                      All solid food                                   Apple juices Sports drinks like Gatorade (No red) Lightly seasoned clear broth or  consume(fat free) Sugar, honey syrup    Complete one G2 drink the morning of surgery at 5:20 AM the day of surgery.      Oral Hygiene is also important to reduce your risk of infection.                                    Remember - BRUSH YOUR TEETH THE MORNING OF SURGERY WITH YOUR REGULAR TOOTHPASTE   Do NOT smoke after Midnight   Take these medicines the morning of surgery with A SIP OF WATER: Amiodarone, Amlodipine, Ezetimbe, Livalo, Metoprolol, Tamsulosin   DO NOT TAKE ANY DIABETES MEDICATIONS THE DAY OF SURGERY                              You may not have any metal on your body including jewelry, and body piercing             Do not wear lotions, powders, cologne, or deodorant              Men may shave face and neck.   Do not bring valuables to the hospital. [place].   Contacts, dentures or bridgework may not be worn into surgery.   Bring small overnight bag day of surgery.    Bring CPAP mask and tubing day of surgery  Special Instructions: Bring a copy of your healthcare power of attorney and living will documents         the day of surgery if you haven't scanned them in before.  Please read over the following fact sheets you were given: IF YOU HAVE QUESTIONS ABOUT YOUR PRE OP INSTRUCTIONS PLEASE CALL [facility] - Preparing for Surgery Before surgery, you can play an important role.  Because skin is not sterile, your skin needs to be as free of germs as possible.  You can reduce the number of germs on your skin by washing with CHG (chlorahexidine gluconate) soap before surgery.  CHG is an antiseptic cleaner which kills germs and bonds with the skin to continue killing germs even after washing. Please DO NOT use if you have an allergy to CHG  or antibacterial soaps.  If your skin becomes reddened/irritated stop using the CHG and inform your nurse when you arrive at Short Stay. Do not shave (including legs and underarms) for at least 48 hours prior to the first CHG shower.  You may shave your face/neck.  Please follow these instructions carefully:  1.  Shower with CHG Soap the night before surgery and the  morning of surgery.  2.  If you choose to wash your hair, wash your hair first as usual with your normal  shampoo.  3.  After you shampoo, rinse your hair and body thoroughly to remove the shampoo.                             4.  Use CHG as you would any other liquid soap.  You can apply chg directly to the skin and wash.  Gently with a scrungie or clean washcloth.  5.  Apply the CHG Soap to your body ONLY FROM THE NECK DOWN.   Do   not use on face/ open  Wound or open sores. Avoid contact with eyes, ears mouth and   genitals (private parts).                       Wash face,  Genitals (private parts) with your normal soap.             6.  Wash thoroughly, paying special attention to the area where your    surgery  will be performed.  7.  Thoroughly rinse your body with warm water from the neck down.  8.  DO NOT shower/wash with your normal soap after using and rinsing off the CHG Soap.                9.  Pat yourself dry with a clean towel.            10.  Wear clean pajamas.            11.  Place clean sheets on your bed the night of your first shower and do not  sleep with pets. Day of Surgery : Do not apply any lotions/deodorants the morning of surgery.  Please wear clean clothes to the hospital/surgery center.  FAILURE TO FOLLOW THESE INSTRUCTIONS MAY RESULT IN THE CANCELLATION OF YOUR SURGERY  PATIENT SIGNATURE_________________________________  NURSE SIGNATURE__________________________________  ________________________________________________________________________   Clayton [name]  An  incentive spirometer is a tool that can help keep your lungs clear and active. This tool measures how well you are filling your lungs with each breath. Taking long deep breaths may help reverse or decrease the chance of developing breathing (pulmonary) problems (especially infection) following: A long period of time when you are unable to move or be active. BEFORE THE PROCEDURE  If the spirometer includes an indicator to show your best effort, your nurse or respiratory therapist will set it to a desired goal. If possible, sit up straight or lean slightly forward. Try not to slouch. Hold the incentive spirometer in an upright position. INSTRUCTIONS FOR USE  Sit on the edge of your bed if possible, or sit up as far as you can in bed or on a chair. Hold the incentive spirometer in an upright position. Breathe out normally. Place the mouthpiece in your mouth and seal your lips tightly around it. Breathe in slowly and as deeply as possible, raising the piston or the ball toward the top of the column. Hold your breath for 3-5 seconds or for as long as possible. Allow the piston or ball to fall to the bottom of the column. Remove the mouthpiece from your mouth and breathe out normally. Rest for a few seconds and repeat Steps 1 through 7 at least 10 times every 1-2 hours when you are awake. Take your time and take a few normal breaths between deep breaths. The spirometer may include an indicator to show your best effort. Use the indicator as a goal to work toward during each repetition. After each set of 10 deep breaths, practice coughing to be sure your lungs are clear. If you have an incision (the cut made at the time of surgery), support your incision when coughing by placing a pillow or rolled up towels firmly against it. Once you are able to get out of bed, walk around indoors and cough well. You may stop using the incentive spirometer when instructed by your caregiver.  RISKS AND COMPLICATIONS Take  your time so you do not get dizzy or light-headed. If you are in pain, you  may need to take or ask for pain medication before doing incentive spirometry. It is harder to take a deep breath if you are having pain. AFTER USE Rest and breathe slowly and easily. It can be helpful to keep track of a log of your progress. Your caregiver can provide you with a simple table to help with this. If you are using the spirometer at home, follow these instructions: [place] IF:  You are having difficultly using the spirometer. You have trouble using the spirometer as often as instructed. Your pain medication is not giving enough relief while using the spirometer. You develop fever of 100.5 F (38.1 C) or higher. SEEK IMMEDIATE MEDICAL CARE IF:  You cough up bloody sputum that had not been present before. You develop fever of 102 F (38.9 C) or greater. You develop worsening pain at or near the incision site. MAKE SURE YOU:  Understand these instructions. Will watch your condition. Will get help right away if you are not doing well or get worse. Document Released: 05/17/2006 Document Revised: 03/29/2011 Document Reviewed: 07/18/2006 Exit[employer] Patient Information 2014 [employer], [place].   ________________________________________________________________________  WHAT IS A BLOOD TRANSFUSION? Blood Transfusion Information  A transfusion is the replacement of blood or some of its parts. Blood is made up of multiple cells which provide different functions. Red blood cells carry oxygen and are used for blood loss replacement. White blood cells fight against infection. Platelets control bleeding. Plasma helps clot blood. Other blood products are available for specialized needs, such as hemophilia or other clotting disorders. BEFORE THE TRANSFUSION  Who gives blood for transfusions?  Healthy volunteers who are fully evaluated to make sure their blood is safe. This is blood bank blood. Transfusion  therapy is the safest it has ever been in the practice of medicine. Before blood is taken from a donor, a complete history is taken to make sure that person has no history of diseases nor engages in risky social behavior (examples are intravenous drug use or sexual activity with multiple partners). The donor's travel history is screened to minimize risk of transmitting infections, such as malaria. The donated blood is tested for signs of infectious diseases, such as HIV and hepatitis. The blood is then tested to be sure it is compatible with you in order to minimize the chance of a transfusion reaction. If you or a relative donates blood, this is often done in anticipation of surgery and is not appropriate for emergency situations. It takes many days to process the donated blood. RISKS AND COMPLICATIONS Although transfusion therapy is very safe and saves many lives, the main dangers of transfusion include:  Getting an infectious disease. Developing a transfusion reaction. This is an allergic reaction to something in the blood you were given. Every precaution is taken to prevent this. The decision to have a blood transfusion has been considered carefully by your caregiver before blood is given. Blood is not given unless the benefits outweigh the risks. AFTER THE TRANSFUSION Right after receiving a blood transfusion, you will usually feel much better and more energetic. This is especially true if your red blood cells have gotten low (anemic). The transfusion raises the level of the red blood cells which carry oxygen, and this usually causes an energy increase. The nurse administering the transfusion will monitor you carefully for complications. HOME CARE INSTRUCTIONS  No special instructions are needed after a transfusion. You may find your energy is better. Speak with your caregiver about any limitations on activity  for underlying diseases you may have. SEEK MEDICAL CARE IF:  Your condition is not  improving after your transfusion. You develop redness or irritation at the intravenous (IV) site. SEEK IMMEDIATE MEDICAL CARE IF:  Any of the following symptoms occur over the next 12 hours: Shaking chills. You have a temperature by mouth above 102 F (38.9 C), not controlled by medicine. Chest, back, or muscle pain. People around you feel you are not acting correctly or are confused. Shortness of breath or difficulty breathing. Dizziness and fainting. You get a rash or develop hives. You have a decrease in urine output. Your urine turns a dark color or changes to pink, red, or brown. Any of the following symptoms occur over the next 10 days: You have a temperature by mouth above 102 F (38.9 C), not controlled by medicine. Shortness of breath. Weakness after normal activity. The white part of the eye turns yellow (jaundice). You have a decrease in the amount of urine or are urinating less often. Your urine turns a dark color or changes to pink, red, or brown. Document Released: 01/02/2000 Document Revised: 03/29/2011 Document Reviewed: 08/21/2007 [facility] Patient Information 2014 [place], [place].  _______________________________________________________________________

## 2020-08-05 NOTE — Addendum Note (Signed)
Addended by: Tori Milks A on: 08/05/2020 02:54 PM   Modules accepted: Orders

## 2020-08-05 NOTE — Progress Notes (Addendum)
COVID Vaccine Completed: Yes x3 Date COVID Vaccine completed: Has received booster: Yes x1 COVID vaccine manufacturer:     Moderna      Date of COVID positive in last 90 days: N/A  PCP - Howard Pouch, DO Cardiologist - Shelva Majestic, MD Electrophysiology - Allegra Lai, MD  Chest x-ray - N/A EKG - 07-17-20 Epic Stress Test - 07-13-19 Epic ECHO - 02-01-19 Epic Cardiac Cath - 2007 Pacemaker/ICD device last checked: Spinal Cord Stimulator:  Sleep Study - +sleep apnea CPAP - Yes  Fasting Blood Sugar - N/A Checks Blood Sugar _____ times a day  Blood Thinner Instructions: Eliquis 5 mg.  Hold 48 hours per Dr. Claiborne Billings.  Patient aware Aspirin Instructions: Last Dose:  Activity level:  Can go up a flight of stairs and perform activities of daily living without stopping and without symptoms of chest pain or shortness of breath.  Able to exercise without symptoms of chest pain or SOB.  Patient walks daily.     Anesthesia review:  Afib, OSA on CPAP, HTN  Ablation scheduled for 09-09-20  Patient denies shortness of breath, fever, cough and chest pain at PAT appointment   Patient verbalized understanding of instructions that were given to them at the PAT appointment. Patient was also instructed that they will need to review over the PAT instructions again at home before surgery.

## 2020-08-06 ENCOUNTER — Encounter (HOSPITAL_COMMUNITY): Payer: Self-pay

## 2020-08-06 ENCOUNTER — Encounter (HOSPITAL_COMMUNITY)
Admission: RE | Admit: 2020-08-06 | Discharge: 2020-08-06 | Disposition: A | Discharge status: Home / Self Care | Payer: Medicare PPO | Source: Ambulatory Visit | Attending: Orthopedic Surgery | Admitting: Orthopedic Surgery

## 2020-08-06 ENCOUNTER — Other Ambulatory Visit: Payer: Self-pay

## 2020-08-06 DIAGNOSIS — Z01812 Encounter for preprocedural laboratory examination: Secondary | ICD-10-CM | POA: Insufficient documentation

## 2020-08-06 LAB — APTT: aPTT: 33 seconds (ref 24–36)

## 2020-08-06 LAB — SURGICAL PCR SCREEN
MRSA, PCR: NEGATIVE
Staphylococcus aureus: NEGATIVE

## 2020-08-06 LAB — PROTIME-INR
INR: 1.1 (ref 0.8–1.2)
Prothrombin Time: 14.1 seconds (ref 11.4–15.2)

## 2020-08-06 NOTE — Progress Notes (Signed)
Anesthesia Chart Review   Case: [id number] Date/Time: 08/12/20 0810   Procedure: TOTAL HIP ARTHROPLASTY ANTERIOR APPROACH (Right: Hip)   Anesthesia type: Spinal   Pre-op diagnosis: Right Hip osteoarthritis   Location: [facility] ROOM 10 / [facility]   Surgeons: Paralee Cancel, MD       DISCUSSION:75 y.o. never smoker with h/o HTN, OSA, atrial fibrillation (Eliquis), right hip OA scheduled for above procedure 08/12/2020 with Dr. Paralee Cancel.   Pt last seen by cardiology 07/17/2020. Stable at this visit, a-fib well controlled.  6 month follow up recommended.  Advised to hold Eliquis 48 hrs prior to surgery.  Anesthesia type per booking is spinal.  Discussed with Dr. Aurea Graff office, pt will need to hold 3 days in order to proceed with a spinal.  They will reach out to cardiology for clearance.  VS: BP (!) 146/85   Pulse 62   Temp 37.3 C (Oral)   Resp 18   Ht 6\' 3"  (1.905 m)   Wt 116.6 kg   SpO2 100%   BMI 32.12 kg/m   PROVIDERS: Kuneff, Renee A, DO is PCP   Shelva Majestic, MD is Cardiologist LABS: Labs reviewed: Acceptable for surgery. (all labs ordered are listed, but only abnormal results are displayed)  Labs Reviewed  SURGICAL PCR SCREEN  PROTIME-INR  APTT  TYPE AND SCREEN     IMAGES:   EKG: 07/17/2020 Rate 70 bpm  Sinus rhythm with 1st degree AV block  LAD RBBB Minimal voltage criteria for LVH, may be normal variant   CV: Stress Test 07/13/2019 The left ventricular ejection fraction is mildly decreased (45-54%). Nuclear stress EF: 47%. Blood pressure demonstrated a hypertensive response to exercise. There was no ST segment deviation noted during stress. Defect 1: There is a large defect of severe severity present in the basal inferior, mid inferior and apical inferior location. Findings consistent with prior myocardial infarction with peri-infarct ischemia. This is a low risk study.   Abnormal, low risk stress nuclear study with prior inferior infarct and mild peri-infarct  ischemia.  Gated ejection fraction 47% with hypokinesis of the inferior basal wall.  Mild left ventricular enlargement.  Echo 02/01/2019  1. Left ventricular ejection fraction, by visual estimation, is 50 to  55%. The left ventricle has low normal function. There is moderately  increased left ventricular hypertrophy.   2. Left ventricular diastolic function could not be evaluated.   3. Mildly dilated left ventricular internal cavity size.   4. The left ventricle has no regional wall motion abnormalities.   5. Global right ventricle has normal systolic function.The right  ventricular size is normal. No increase in right ventricular wall  thickness.   6. Left atrial size was mildly dilated.   7. Right atrial size was normal.   8. The mitral valve is normal in structure. Trivial mitral valve  regurgitation.   9. The tricuspid valve is normal in structure.  10. The aortic valve is tricuspid. Aortic valve regurgitation is not  visualized. No evidence of aortic valve sclerosis or stenosis.  11. The pulmonic valve was grossly normal. Pulmonic valve regurgitation is  not visualized.  12. Aortic dilatation noted.  13. There is mild dilatation of the ascending aorta measuring 39 mm.  14. The inferior vena cava is normal in size with greater than 50%  respiratory variability, suggesting right atrial pressure of 3 mmHg.  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 Humphrey Rolls; CPAP titration 11/12/04- Dr. Humphrey Rolls    Past Surgical History:  Procedure Laterality Date   CARDIAC CATHETERIZATION  11/03/05   CARDIOVERSION N/A  09/26/2018   Procedure: CARDIOVERSION;  Surgeon: Geralynn Rile, MD;  Location: [place];  Service: Endoscopy;  Laterality: N/A;   CARDIOVERSION N/A 03/13/2019   Procedure: CARDIOVERSION;  Surgeon: Skeet Latch, MD;  Location: [facility] ENDOSCOPY;  Service: Cardiovascular;  Laterality: N/A;   CATARACT EXTRACTION  2014   KNEE ARTHROSCOPY     left knee- 2002    TOTAL HIP ARTHROPLASTY  06/08/2011   Procedure: TOTAL HIP ARTHROPLASTY ANTERIOR APPROACH;  Surgeon: [name], MD;  Location: [facility];  Service: Orthopedics;  Laterality: Left;    MEDICATIONS:  amiodarone (PACERONE) 200 MG tablet   amLODipine (NORVASC) 5 MG tablet   Ascorbic Acid (VITAMIN C) 1000 MG tablet   Coenzyme Q10 200 MG capsule   ELIQUIS 5 MG TABS tablet   ezetimibe (ZETIA) 10 MG tablet   gabapentin (NEURONTIN) 100 MG capsule   LIVALO 2 MG TABS   metFORMIN (GLUCOPHAGE) 500 MG tablet   metoprolol succinate (TOPROL-XL) 100 MG 24 hr tablet   metoprolol succinate (TOPROL-XL) 50 MG 24 hr tablet   olmesartan (BENICAR) 40 MG tablet   spironolactone (ALDACTONE) 25 MG tablet   tamsulosin (FLOMAX) 0.4 MG CAPS capsule   triamcinolone cream (KENALOG) 0.5 %   zinc gluconate 50 MG tablet   No current facility-administered medications for this encounter.    Clayton Felix, PA-C [facility]Surgical [facility] [phone]

## 2020-08-08 ENCOUNTER — Other Ambulatory Visit (HOSPITAL_COMMUNITY)
Admission: RE | Admit: 2020-08-08 | Discharge: 2020-08-08 | Disposition: A | Discharge status: Home / Self Care | Payer: Medicare PPO | Source: Ambulatory Visit | Attending: Orthopedic Surgery | Admitting: Orthopedic Surgery

## 2020-08-08 DIAGNOSIS — Z01812 Encounter for preprocedural laboratory examination: Secondary | ICD-10-CM | POA: Insufficient documentation

## 2020-08-08 DIAGNOSIS — Z20822 Contact with and (suspected) exposure to covid-19: Secondary | ICD-10-CM | POA: Insufficient documentation

## 2020-08-08 LAB — SARS CORONAVIRUS 2 (TAT 6-24 HRS): SARS Coronavirus 2: NEGATIVE

## 2020-08-11 ENCOUNTER — Other Ambulatory Visit: Payer: Self-pay

## 2020-08-11 DIAGNOSIS — Z79899 Other long term (current) drug therapy: Secondary | ICD-10-CM

## 2020-08-11 LAB — BASIC METABOLIC PANEL
BUN/Creatinine Ratio: 17 (ref 10–24)
BUN: 26 mg/dL (ref 8–27)
CO2: 20 mmol/L (ref 20–29)
Calcium: 10.3 mg/dL — ABNORMAL HIGH (ref 8.6–10.2)
Chloride: 108 mmol/L — ABNORMAL HIGH (ref 96–106)
Creatinine, Ser: 1.52 mg/dL — ABNORMAL HIGH (ref 0.76–1.27)
Glucose: 106 mg/dL — ABNORMAL HIGH (ref 65–99)
Potassium: 4.9 mmol/L (ref 3.5–5.2)
Sodium: 140 mmol/L (ref 134–144)
eGFR: 48 mL/min/{1.73_m2} — ABNORMAL LOW (ref >59)

## 2020-08-11 NOTE — H&P (Signed)
TOTAL HIP ADMISSION H&P  Patient is admitted for right total hip arthroplasty.  Subjective:  Chief Complaint: right hip pain  HPI: Clayton [name], 75 y.o. male, has a history of pain and functional disability in the right hip(s) due to arthritis and patient has failed non-surgical conservative treatments for greater than 12 weeks to include NSAID's and/or analgesics, corticosteriod injections, and activity modification.  Onset of symptoms was gradual starting 2 years ago with gradually worsening course since that time.The patient noted no past surgery on the right hip(s).  Patient currently rates pain in the right hip at 8 out of 10 with activity. Patient has worsening of pain with activity and weight bearing and pain that interfers with activities of daily living. Patient has evidence of joint space narrowing by imaging studies. This condition presents safety issues increasing the risk of falls.  There is no current active infection.  Patient Active Problem List   Diagnosis Date Noted   Morbid obesity ([place]) 07/04/2020   Acquired thrombophilia ([place]) 07/04/2020   Preoperative clearance 07/04/2020   Arthritis of right hip 11/13/2019   Pes planus 11/13/2019   Degenerative arthritis of left knee 04/18/2019   Persistent atrial fibrillation ([place])    Nonallopathic lesion of lumbosacral region 04/23/2016   Nonallopathic lesion of sacral region 04/23/2016   Nonallopathic lesion of thoracic region 04/23/2016   Delayed union of rib fracture, left 02/26/2016   BMI 33.0-33.9,adult 06/09/2015   Prediabetes 06/09/2015   OSA on CPAP 08/20/2012   HTN (hypertension) 08/20/2012   Hyperlipidemia 08/20/2012   S/P left THA, AA 06/08/2011   Past Medical History:  Diagnosis Date   Arthritis    hips    Atrial fibrillation (HCC)    Cataract    Cochlear Meniere syndrome of left ear 06/09/2015   CPAP (continuous positive airway pressure) dependence    Dysrhythmia    PAF- hx of 7 years ago    HLD  (hyperlipidemia)    Hypertension 05/29/2010   ECHO-EF 67% wnl   Meniere disease    Obesity    Palpitations    Sleep apnea 09/14/2004    [facility]- Dr. Humphrey Rolls; CPAP titration 11/12/04- Dr. Humphrey Rolls    Past Surgical History:  Procedure Laterality Date   CARDIAC CATHETERIZATION  11/03/05   CARDIOVERSION N/A 09/26/2018   Procedure: CARDIOVERSION;  Surgeon: Geralynn Rile, MD;  Location: [facility];  Service: Endoscopy;  Laterality: N/A;   CARDIOVERSION N/A 03/13/2019   Procedure: CARDIOVERSION;  Surgeon: Skeet Latch, MD;  Location: [facility] ENDOSCOPY;  Service: Cardiovascular;  Laterality: N/A;   CATARACT EXTRACTION  2014   KNEE ARTHROSCOPY     left knee- 2002    TOTAL HIP ARTHROPLASTY  06/08/2011   Procedure: TOTAL HIP ARTHROPLASTY ANTERIOR APPROACH;  Surgeon: [name], MD;  Location: [facility];  Service: Orthopedics;  Laterality: Left;    No current facility-administered medications for this encounter.   Current Outpatient Medications  Medication Sig Dispense Refill Last Dose   Ascorbic Acid (VITAMIN C) 1000 MG tablet Take 1,000 mg by mouth in the morning.      Coenzyme Q10 200 MG capsule Take 200 mg by mouth in the morning.      ELIQUIS 5 MG TABS tablet TAKE 1 TABLET BY MOUTH TWICE A DAY (Patient taking differently: Take 5 mg by mouth 2 (two) times daily.) 180 tablet 1    ezetimibe (ZETIA) 10 MG tablet TAKE 1 TABLET BY MOUTH EVERY DAY (Patient taking differently: Take 10 mg  by mouth in the morning.) 90 tablet 3    LIVALO 2 MG TABS TAKE 1 TABLET BY MOUTH EVERY DAY (Patient taking differently: Take 2 mg by mouth every evening.) 90 tablet 3    metFORMIN (GLUCOPHAGE) 500 MG tablet Take 1 tablet (500 mg total) by mouth daily with breakfast. (Patient taking differently: Take 500 mg by mouth in the morning.) 90 tablet 1    metoprolol succinate (TOPROL-XL) 100 MG 24 hr tablet Take 100 mg by mouth every evening. Take with or immediately following a meal.      metoprolol  succinate (TOPROL-XL) 50 MG 24 hr tablet Take 1 tablet (50 mg total) by mouth daily. Take with or immediately following a meal. (Patient taking differently: Take 50 mg by mouth in the morning. Take with or immediately following a meal.) 30 tablet 6    olmesartan (BENICAR) 40 MG tablet TAKE 1 TABLET BY MOUTH EVERY DAY (Patient taking differently: Take 40 mg by mouth in the morning.) 90 tablet 3    tamsulosin (FLOMAX) 0.4 MG CAPS capsule Take 0.4 mg by mouth in the morning.      zinc gluconate 50 MG tablet Take 50 mg by mouth in the morning.      amiodarone (PACERONE) 200 MG tablet Take 2 tablets (400 mg total) TWICE daily for 2 weeks then, take 1 tablet (200 mg total) TWICE daily for 2 weeks, then take 1 tablet ONCE daily 84 tablet 3 Not Taking   amLODipine (NORVASC) 5 MG tablet TAKE 1.5 TABLETS BY MOUTH DAILY. 135 tablet 2    gabapentin (NEURONTIN) 100 MG capsule Take 2 capsules (200 mg total) by mouth at bedtime. 180 capsule 3 Not Taking   spironolactone (ALDACTONE) 25 MG tablet Take 0.5 tablets (12.5 mg total) by mouth every other day.      triamcinolone cream (KENALOG) 0.5 % APPLY 1 APPLICATION TOPICALLY 2 (TWO) TIMES DAILY. TO AFFECTED AREAS. 30 g 3    No Known Allergies  Social History   Tobacco Use   Smoking status: Never   Smokeless tobacco: Never  Substance Use Topics   Alcohol use: No    Family History  Problem Relation Age of Onset   Hypertension Mother    Stroke Mother    Diabetes Mother    Hyperlipidemia Mother    Obesity Mother    Diabetes Sister    Stroke Maternal Grandmother        86 died    Heart disease Maternal Grandfather    Hypertension Maternal Grandfather    Diabetes Maternal Grandfather    Arthritis Father    Heart disease Father    Heart disease Paternal Grandfather      Review of Systems  Constitutional:  Negative for chills and fever.  Respiratory:  Negative for cough and shortness of breath.   Cardiovascular:  Negative for chest pain.   Gastrointestinal:  Negative for nausea and vomiting.  Musculoskeletal:  Positive for arthralgias.   Objective:  Physical Exam Well nourished and well developed. General: Alert and oriented x3, cooperative and pleasant, no acute distress. Head: normocephalic, atraumatic, neck supple. Eyes: EOMI.  Musculoskeletal: Right hip exam: Continued limited right hip range of motion with hip flexion internal rotation to about 10 degrees with reproducible discomfort, external rotation to over 20 degrees Mild tenderness laterally Neurovascular tact distally  Calves soft and nontender. Motor function intact in LE. Strength 5/5 LE bilaterally. Neuro: Distal pulses 2+. Sensation to light touch intact in LE.  Vital signs in  last 24 hours:    Labs:   Estimated body mass index is 32.12 kg/m as calculated from the following:   Height as of 08/06/20: '6\' 3"'$  (1.905 m).   Weight as of 08/06/20: 116.6 kg.   Imaging Review Plain radiographs demonstrate severe degenerative joint disease of the right hip(s). The bone quality appears to be adequate for age and reported activity level.      Assessment/Plan:  End stage arthritis, right hip(s)  The patient history, physical examination, clinical judgement of the provider and imaging studies are consistent with end stage degenerative joint disease of the right hip(s) and total hip arthroplasty is deemed medically necessary. The treatment options including medical management, injection therapy, arthroscopy and arthroplasty were discussed at length. The risks and benefits of total hip arthroplasty were presented and reviewed. The risks due to aseptic loosening, infection, stiffness, dislocation/subluxation,  thromboembolic complications and other imponderables were discussed.  The patient acknowledged the explanation, agreed to proceed with the plan and consent was signed. Patient is being admitted for inpatient treatment for surgery, pain control, PT, OT,  prophylactic antibiotics, VTE prophylaxis, progressive ambulation and ADL's and discharge planning.The patient is planning to be discharged  home.  Therapy Plans: HEP Disposition: Home with wife Planned DVT Prophylaxis: Eliquis 5 mg BID DME needed: none PCP: Dr. Howard Pouch, clearance received Cardiologist: Dr. Guinevere Ferrari, clearance received TXA: IV Allergies: NKDA Anesthesia Concerns: none BMI: 33.6 Last HgbA1c: Not diabetic.  Other: - To hold Eliquis for 3 days per Cardiologist - Recent episode of a fib, resolved with amiodarone recently -- planning for ablation after hip surgery - okay to proceed per Dr. Curt Bears - Hx of OSA - Tylenol, Robaxin, Tramadol/Norco pain   Griffith Citron, PA-C Orthopedic Surgery [facility] [place] [phone]

## 2020-08-12 ENCOUNTER — Ambulatory Visit (HOSPITAL_COMMUNITY): Payer: Medicare PPO

## 2020-08-12 ENCOUNTER — Ambulatory Visit (HOSPITAL_COMMUNITY): Payer: Medicare PPO | Admitting: Physician Assistant

## 2020-08-12 ENCOUNTER — Observation Stay (HOSPITAL_COMMUNITY)
Admission: RE | Admit: 2020-08-12 | Discharge: 2020-08-13 | Disposition: A | Discharge status: Home / Self Care | Payer: Medicare PPO | Attending: Orthopedic Surgery | Admitting: Orthopedic Surgery

## 2020-08-12 ENCOUNTER — Observation Stay (HOSPITAL_COMMUNITY): Payer: Medicare PPO

## 2020-08-12 ENCOUNTER — Other Ambulatory Visit: Payer: Self-pay

## 2020-08-12 ENCOUNTER — Encounter (HOSPITAL_COMMUNITY)
Admission: RE | Disposition: A | Discharge status: Home / Self Care | Payer: Self-pay | Source: Home / Self Care | Attending: Orthopedic Surgery

## 2020-08-12 ENCOUNTER — Encounter (HOSPITAL_COMMUNITY): Payer: Self-pay | Admitting: Orthopedic Surgery

## 2020-08-12 DIAGNOSIS — Z96642 Presence of left artificial hip joint: Secondary | ICD-10-CM | POA: Diagnosis not present

## 2020-08-12 DIAGNOSIS — I10 Essential (primary) hypertension: Secondary | ICD-10-CM | POA: Diagnosis not present

## 2020-08-12 DIAGNOSIS — M25751 Osteophyte, right hip: Secondary | ICD-10-CM | POA: Diagnosis not present

## 2020-08-12 DIAGNOSIS — Z7901 Long term (current) use of anticoagulants: Secondary | ICD-10-CM | POA: Diagnosis not present

## 2020-08-12 DIAGNOSIS — Z96641 Presence of right artificial hip joint: Secondary | ICD-10-CM | POA: Diagnosis not present

## 2020-08-12 DIAGNOSIS — Z96649 Presence of unspecified artificial hip joint: Secondary | ICD-10-CM

## 2020-08-12 DIAGNOSIS — Z7984 Long term (current) use of oral hypoglycemic drugs: Secondary | ICD-10-CM | POA: Insufficient documentation

## 2020-08-12 DIAGNOSIS — M1611 Unilateral primary osteoarthritis, right hip: Secondary | ICD-10-CM | POA: Diagnosis not present

## 2020-08-12 DIAGNOSIS — Z79899 Other long term (current) drug therapy: Secondary | ICD-10-CM | POA: Diagnosis not present

## 2020-08-12 DIAGNOSIS — Z471 Aftercare following joint replacement surgery: Secondary | ICD-10-CM | POA: Diagnosis not present

## 2020-08-12 DIAGNOSIS — I4819 Other persistent atrial fibrillation: Secondary | ICD-10-CM | POA: Insufficient documentation

## 2020-08-12 DIAGNOSIS — R7303 Prediabetes: Secondary | ICD-10-CM | POA: Diagnosis not present

## 2020-08-12 HISTORY — PX: TOTAL HIP ARTHROPLASTY: SHX124

## 2020-08-12 HISTORY — PX: OTHER SURGICAL HISTORY: SHX169

## 2020-08-12 HISTORY — DX: Presence of unspecified artificial hip joint: Z96.649

## 2020-08-12 LAB — COMPREHENSIVE METABOLIC PANEL
ALT: 27 U/L (ref 0–44)
AST: 23 U/L (ref 15–41)
Albumin: 3.5 g/dL (ref 3.5–5.0)
Alkaline Phosphatase: 44 U/L (ref 38–126)
Anion gap: 4 — ABNORMAL LOW (ref 5–15)
BUN: 30 mg/dL — ABNORMAL HIGH (ref 8–23)
CO2: 24 mmol/L (ref 22–32)
Calcium: 9.1 mg/dL (ref 8.9–10.3)
Chloride: 109 mmol/L (ref 98–111)
Creatinine, Ser: 1.52 mg/dL — ABNORMAL HIGH (ref 0.61–1.24)
GFR, Estimated: 48 mL/min — ABNORMAL LOW (ref >60)
Glucose, Bld: 121 mg/dL — ABNORMAL HIGH (ref 70–99)
Potassium: 4.5 mmol/L (ref 3.5–5.1)
Sodium: 137 mmol/L (ref 135–145)
Total Bilirubin: 0.8 mg/dL (ref 0.3–1.2)
Total Protein: 5.6 g/dL — ABNORMAL LOW (ref 6.5–8.1)

## 2020-08-12 LAB — GLUCOSE, CAPILLARY
Glucose-Capillary: 191 mg/dL — ABNORMAL HIGH (ref 70–99)
Glucose-Capillary: 243 mg/dL — ABNORMAL HIGH (ref 70–99)

## 2020-08-12 LAB — CBC
HCT: 36.4 % — ABNORMAL LOW (ref 39.0–52.0)
Hemoglobin: 12 g/dL — ABNORMAL LOW (ref 13.0–17.0)
MCH: 30.6 pg (ref 26.0–34.0)
MCHC: 33 g/dL (ref 30.0–36.0)
MCV: 92.9 fL (ref 80.0–100.0)
Platelets: 161 10*3/uL (ref 150–400)
RBC: 3.92 MIL/uL — ABNORMAL LOW (ref 4.22–5.81)
RDW: 13.1 % (ref 11.5–15.5)
WBC: 5.5 10*3/uL (ref 4.0–10.5)
nRBC: 0 % (ref 0.0–0.2)

## 2020-08-12 LAB — TYPE AND SCREEN
ABO/RH(D): O POS
Antibody Screen: NEGATIVE

## 2020-08-12 SURGERY — ARTHROPLASTY, HIP, TOTAL, ANTERIOR APPROACH
Anesthesia: Spinal | Site: Hip | Laterality: Right

## 2020-08-12 MED ORDER — FERROUS SULFATE 325 (65 FE) MG PO TABS
325.0000 mg | ORAL_TABLET | Freq: Three times a day (TID) | ORAL | Status: DC
  Administered 2020-08-12 – 2020-08-13 (×3): 325 mg via ORAL
  Filled 2020-08-12 (×3): qty 1

## 2020-08-12 MED ORDER — HYDROCODONE-ACETAMINOPHEN 5-325 MG PO TABS
1.0000 | ORAL_TABLET | ORAL | Status: DC | PRN
  Administered 2020-08-12 – 2020-08-13 (×3): 1 via ORAL
  Administered 2020-08-13: 2 via ORAL
  Filled 2020-08-12 (×2): qty 1
  Filled 2020-08-12: qty 2
  Filled 2020-08-12: qty 1

## 2020-08-12 MED ORDER — PHENYLEPHRINE HCL (PRESSORS) 10 MG/ML IV SOLN
INTRAVENOUS | Status: AC
  Filled 2020-08-12: qty 1

## 2020-08-12 MED ORDER — STERILE WATER FOR IRRIGATION IR SOLN
Status: DC | PRN
  Administered 2020-08-12: 2000 mL

## 2020-08-12 MED ORDER — CEFAZOLIN SODIUM 2 G IJ SOLR
2.0000 g | INTRAMUSCULAR | Status: AC
  Administered 2020-08-12: 2 g via INTRAVENOUS
  Filled 2020-08-12: qty 2

## 2020-08-12 MED ORDER — AMIODARONE HCL 200 MG PO TABS
200.0000 mg | ORAL_TABLET | Freq: Every day | ORAL | Status: DC
  Administered 2020-08-13: 200 mg via ORAL
  Filled 2020-08-12: qty 1

## 2020-08-12 MED ORDER — POLYETHYLENE GLYCOL 3350 17 G PO PACK: 17.0000 g | PACK | Freq: Every day | ORAL | Status: DC | PRN

## 2020-08-12 MED ORDER — LACTATED RINGERS IV SOLN: INTRAVENOUS | Status: DC

## 2020-08-12 MED ORDER — DOCUSATE SODIUM 100 MG PO CAPS
100.0000 mg | ORAL_CAPSULE | Freq: Two times a day (BID) | ORAL | Status: DC
  Administered 2020-08-12 – 2020-08-13 (×3): 100 mg via ORAL
  Filled 2020-08-12 (×3): qty 1

## 2020-08-12 MED ORDER — MORPHINE SULFATE (PF) 2 MG/ML IV SOLN: 0.5000 mg | INTRAVENOUS | Status: DC | PRN

## 2020-08-12 MED ORDER — DEXAMETHASONE SODIUM PHOSPHATE 10 MG/ML IJ SOLN: 8.0000 mg | Freq: Once | INTRAMUSCULAR | Status: AC

## 2020-08-12 MED ORDER — EZETIMIBE 10 MG PO TABS: 10.0000 mg | ORAL_TABLET | Freq: Every day | ORAL | Status: DC

## 2020-08-12 MED ORDER — DIPHENHYDRAMINE HCL 12.5 MG/5ML PO ELIX: 12.5000 mg | ORAL_SOLUTION | ORAL | Status: DC | PRN

## 2020-08-12 MED ORDER — DEXAMETHASONE SODIUM PHOSPHATE 10 MG/ML IJ SOLN
INTRAMUSCULAR | Status: AC
  Filled 2020-08-12: qty 1

## 2020-08-12 MED ORDER — ONDANSETRON HCL 4 MG/2ML IJ SOLN
INTRAMUSCULAR | Status: AC
  Filled 2020-08-12: qty 2

## 2020-08-12 MED ORDER — FENTANYL CITRATE (PF) 100 MCG/2ML IJ SOLN
INTRAMUSCULAR | Status: AC
  Filled 2020-08-12: qty 2

## 2020-08-12 MED ORDER — MENTHOL 3 MG MT LOZG: 1.0000 | LOZENGE | OROMUCOSAL | Status: DC | PRN

## 2020-08-12 MED ORDER — INSULIN ASPART 100 UNIT/ML IJ SOLN
0.0000 [IU] | Freq: Three times a day (TID) | INTRAMUSCULAR | Status: DC
  Administered 2020-08-12 – 2020-08-13 (×2): 3 [IU] via SUBCUTANEOUS

## 2020-08-12 MED ORDER — TRAMADOL HCL 50 MG PO TABS: 50.0000 mg | ORAL_TABLET | Freq: Four times a day (QID) | ORAL | Status: DC | PRN

## 2020-08-12 MED ORDER — MIDAZOLAM HCL 5 MG/5ML IJ SOLN
INTRAMUSCULAR | Status: DC | PRN
  Administered 2020-08-12: 2 mg via INTRAVENOUS

## 2020-08-12 MED ORDER — CELECOXIB 200 MG PO CAPS
200.0000 mg | ORAL_CAPSULE | Freq: Two times a day (BID) | ORAL | Status: DC
  Administered 2020-08-12 – 2020-08-13 (×2): 200 mg via ORAL
  Filled 2020-08-12 (×2): qty 1

## 2020-08-12 MED ORDER — MIDAZOLAM HCL 2 MG/2ML IJ SOLN
INTRAMUSCULAR | Status: AC
  Filled 2020-08-12: qty 2

## 2020-08-12 MED ORDER — PHENYLEPHRINE HCL-NACL 10-0.9 MG/250ML-% IV SOLN
INTRAVENOUS | Status: DC | PRN
Start: 2020-08-12 — End: 2020-08-12
  Administered 2020-08-12: 50 ug/min via INTRAVENOUS

## 2020-08-12 MED ORDER — METHOCARBAMOL 500 MG PO TABS
500.0000 mg | ORAL_TABLET | Freq: Four times a day (QID) | ORAL | Status: DC | PRN
  Administered 2020-08-12 – 2020-08-13 (×2): 500 mg via ORAL
  Filled 2020-08-12 (×2): qty 1

## 2020-08-12 MED ORDER — FENTANYL CITRATE (PF) 100 MCG/2ML IJ SOLN
INTRAMUSCULAR | Status: DC | PRN
  Administered 2020-08-12: 100 ug via INTRAVENOUS

## 2020-08-12 MED ORDER — METOCLOPRAMIDE HCL 5 MG PO TABS: 5.0000 mg | ORAL_TABLET | Freq: Three times a day (TID) | ORAL | Status: DC | PRN

## 2020-08-12 MED ORDER — SODIUM CHLORIDE 0.9 % IR SOLN
Status: DC | PRN
  Administered 2020-08-12: 1000 mL

## 2020-08-12 MED ORDER — HYDROMORPHONE HCL 1 MG/ML IJ SOLN: 0.2500 mg | INTRAMUSCULAR | Status: DC | PRN

## 2020-08-12 MED ORDER — METHOCARBAMOL 500 MG IVPB - SIMPLE MED
500.0000 mg | Freq: Four times a day (QID) | INTRAVENOUS | Status: DC | PRN
  Filled 2020-08-12: qty 50

## 2020-08-12 MED ORDER — CHLORHEXIDINE GLUCONATE 0.12 % MT SOLN
15.0000 mL | Freq: Once | OROMUCOSAL | Status: AC
  Administered 2020-08-12: 15 mL via OROMUCOSAL

## 2020-08-12 MED ORDER — APIXABAN 2.5 MG PO TABS
2.5000 mg | ORAL_TABLET | Freq: Two times a day (BID) | ORAL | Status: DC
  Administered 2020-08-13: 2.5 mg via ORAL
  Filled 2020-08-12: qty 1

## 2020-08-12 MED ORDER — AMLODIPINE BESYLATE 5 MG PO TABS
7.5000 mg | ORAL_TABLET | Freq: Every day | ORAL | Status: DC
  Administered 2020-08-13: 7.5 mg via ORAL
  Filled 2020-08-12: qty 2

## 2020-08-12 MED ORDER — BUPIVACAINE IN DEXTROSE 0.75-8.25 % IT SOLN
INTRATHECAL | Status: DC | PRN
  Administered 2020-08-12: 2 mL via INTRATHECAL

## 2020-08-12 MED ORDER — EPHEDRINE SULFATE-NACL 50-0.9 MG/10ML-% IV SOSY
PREFILLED_SYRINGE | INTRAVENOUS | Status: DC | PRN
  Administered 2020-08-12 (×2): 10 mg via INTRAVENOUS

## 2020-08-12 MED ORDER — PROPOFOL 1000 MG/100ML IV EMUL
INTRAVENOUS | Status: AC
  Filled 2020-08-12: qty 100

## 2020-08-12 MED ORDER — SPIRONOLACTONE 12.5 MG HALF TABLET
12.5000 mg | ORAL_TABLET | ORAL | Status: DC
  Administered 2020-08-13: 12.5 mg via ORAL
  Filled 2020-08-12: qty 1

## 2020-08-12 MED ORDER — CEFAZOLIN SODIUM 2 G IJ SOLR
2.0000 g | Freq: Four times a day (QID) | INTRAMUSCULAR | Status: AC
  Administered 2020-08-12 (×2): 2 g via INTRAVENOUS
  Filled 2020-08-12 (×2): qty 2

## 2020-08-12 MED ORDER — PHENOL 1.4 % MT LIQD: 1.0000 | OROMUCOSAL | Status: DC | PRN

## 2020-08-12 MED ORDER — PROPOFOL 10 MG/ML IV BOLUS
INTRAVENOUS | Status: DC | PRN
  Administered 2020-08-12: 25 mg via INTRAVENOUS
  Administered 2020-08-12 (×3): 20 mg via INTRAVENOUS

## 2020-08-12 MED ORDER — ONDANSETRON HCL 4 MG/2ML IJ SOLN
INTRAMUSCULAR | Status: DC | PRN
  Administered 2020-08-12: 4 mg via INTRAVENOUS

## 2020-08-12 MED ORDER — LIDOCAINE 2% (20 MG/ML) 5 ML SYRINGE
INTRAMUSCULAR | Status: AC
  Filled 2020-08-12: qty 5

## 2020-08-12 MED ORDER — POVIDONE-IODINE 10 % EX SWAB
2.0000 "application " | Freq: Once | CUTANEOUS | Status: AC
  Administered 2020-08-12: 2 via TOPICAL

## 2020-08-12 MED ORDER — PROPOFOL 10 MG/ML IV BOLUS
INTRAVENOUS | Status: AC
  Filled 2020-08-12: qty 20

## 2020-08-12 MED ORDER — ONDANSETRON HCL 4 MG PO TABS: 4.0000 mg | ORAL_TABLET | Freq: Four times a day (QID) | ORAL | Status: DC | PRN

## 2020-08-12 MED ORDER — DEXAMETHASONE SODIUM PHOSPHATE 10 MG/ML IJ SOLN
INTRAMUSCULAR | Status: DC | PRN
  Administered 2020-08-12: 10 mg via INTRAVENOUS

## 2020-08-12 MED ORDER — METOPROLOL SUCCINATE ER 50 MG PO TB24
50.0000 mg | ORAL_TABLET | Freq: Every day | ORAL | Status: DC
  Administered 2020-08-13: 50 mg via ORAL
  Filled 2020-08-12: qty 1

## 2020-08-12 MED ORDER — PRAVASTATIN SODIUM 20 MG PO TABS: 40.0000 mg | ORAL_TABLET | Freq: Every day | ORAL | Status: DC

## 2020-08-12 MED ORDER — PROPOFOL 500 MG/50ML IV EMUL
INTRAVENOUS | Status: DC | PRN
  Administered 2020-08-12: 50 ug/kg/min via INTRAVENOUS

## 2020-08-12 MED ORDER — DEXAMETHASONE SODIUM PHOSPHATE 10 MG/ML IJ SOLN
10.0000 mg | Freq: Once | INTRAMUSCULAR | Status: AC
  Administered 2020-08-13: 10 mg via INTRAVENOUS
  Filled 2020-08-12: qty 1

## 2020-08-12 MED ORDER — TRANEXAMIC ACID-NACL 1000-0.7 MG/100ML-% IV SOLN
1000.0000 mg | Freq: Once | INTRAVENOUS | Status: AC
  Administered 2020-08-12: 1000 mg via INTRAVENOUS
  Filled 2020-08-12: qty 100

## 2020-08-12 MED ORDER — TRANEXAMIC ACID-NACL 1000-0.7 MG/100ML-% IV SOLN
1000.0000 mg | INTRAVENOUS | Status: AC
  Administered 2020-08-12: 1000 mg via INTRAVENOUS
  Filled 2020-08-12: qty 100

## 2020-08-12 MED ORDER — IRBESARTAN 150 MG PO TABS
300.0000 mg | ORAL_TABLET | Freq: Every day | ORAL | Status: DC
  Administered 2020-08-13: 300 mg via ORAL
  Filled 2020-08-12: qty 2

## 2020-08-12 MED ORDER — METOPROLOL SUCCINATE ER 50 MG PO TB24: 100.0000 mg | ORAL_TABLET | Freq: Every evening | ORAL | Status: DC

## 2020-08-12 MED ORDER — METOCLOPRAMIDE HCL 5 MG/ML IJ SOLN: 5.0000 mg | Freq: Three times a day (TID) | INTRAMUSCULAR | Status: DC | PRN

## 2020-08-12 MED ORDER — ONDANSETRON HCL 4 MG/2ML IJ SOLN: 4.0000 mg | Freq: Once | INTRAMUSCULAR | Status: DC | PRN

## 2020-08-12 MED ORDER — SODIUM CHLORIDE 0.9 % IV SOLN: INTRAVENOUS | Status: DC

## 2020-08-12 MED ORDER — BISACODYL 10 MG RE SUPP: 10.0000 mg | Freq: Every day | RECTAL | Status: DC | PRN

## 2020-08-12 MED ORDER — TAMSULOSIN HCL 0.4 MG PO CAPS
0.4000 mg | ORAL_CAPSULE | Freq: Every day | ORAL | Status: DC
  Administered 2020-08-13: 0.4 mg via ORAL
  Filled 2020-08-12: qty 1

## 2020-08-12 MED ORDER — ACETAMINOPHEN 325 MG PO TABS: 325.0000 mg | ORAL_TABLET | Freq: Four times a day (QID) | ORAL | Status: DC | PRN

## 2020-08-12 MED ORDER — EPHEDRINE 5 MG/ML INJ
INTRAVENOUS | Status: AC
  Filled 2020-08-12: qty 5

## 2020-08-12 MED ORDER — ORAL CARE MOUTH RINSE
15.0000 mL | Freq: Once | OROMUCOSAL | Status: AC
Start: 2020-08-12 — End: 2020-08-12

## 2020-08-12 MED ORDER — ACETAMINOPHEN 10 MG/ML IV SOLN: 1000.0000 mg | Freq: Once | INTRAVENOUS | Status: DC | PRN

## 2020-08-12 MED ORDER — ONDANSETRON HCL 4 MG/2ML IJ SOLN: 4.0000 mg | Freq: Four times a day (QID) | INTRAMUSCULAR | Status: DC | PRN

## 2020-08-12 SURGICAL SUPPLY — 39 items
BAG COUNTER SPONGE SURGICOUNT (BAG) ×2 IMPLANT
BAG DECANTER FOR FLEXI CONT (MISCELLANEOUS) IMPLANT
BAG ZIPLOCK 12X15 (MISCELLANEOUS) IMPLANT
BLADE SAG 18X100X1.27 (BLADE) ×2 IMPLANT
COVER PERINEAL POST (MISCELLANEOUS) ×2 IMPLANT
COVER SURGICAL LIGHT HANDLE (MISCELLANEOUS) ×2 IMPLANT
CUP ACETBLR 54 OD PINNACLE (Hips) ×2 IMPLANT
DERMABOND ADVANCED (GAUZE/BANDAGES/DRESSINGS) ×1
DERMABOND ADVANCED .7 DNX12 (GAUZE/BANDAGES/DRESSINGS) ×1 IMPLANT
DRAPE FOOT SWITCH (DRAPES) ×2 IMPLANT
DRAPE STERI IOBAN 125X83 (DRAPES) ×2 IMPLANT
DRAPE U-SHAPE 47X51 STRL (DRAPES) ×4 IMPLANT
DRESSING AQUACEL AG SP 3.5X10 (GAUZE/BANDAGES/DRESSINGS) ×1 IMPLANT
DRSG AQUACEL AG SP 3.5X10 (GAUZE/BANDAGES/DRESSINGS) ×2
DURAPREP 26ML APPLICATOR (WOUND CARE) ×2 IMPLANT
ELECT REM PT RETURN 15FT ADLT (MISCELLANEOUS) ×2 IMPLANT
ELIMINATOR HOLE APEX DEPUY (Hips) ×2 IMPLANT
GLOVE SURG ENC MOIS LTX SZ6 (GLOVE) ×4 IMPLANT
GLOVE SURG UNDER LTX SZ7.5 (GLOVE) ×2 IMPLANT
GLOVE SURG UNDER POLY LF SZ6.5 (GLOVE) ×2 IMPLANT
GLOVE SURG UNDER POLY LF SZ7.5 (GLOVE) ×4 IMPLANT
GOWN STRL REUS W/TWL LRG LVL3 (GOWN DISPOSABLE) ×4 IMPLANT
HEAD CERAMIC 36 PLUS5 (Hips) ×2 IMPLANT
HOLDER FOLEY CATH W/STRAP (MISCELLANEOUS) ×2 IMPLANT
KIT TURNOVER KIT A (KITS) ×2 IMPLANT
LINER NEUTRAL 54X36MM PLUS 4 (Hips) ×2 IMPLANT
PACK ANTERIOR HIP CUSTOM (KITS) ×2 IMPLANT
PENCIL SMOKE EVACUATOR (MISCELLANEOUS) IMPLANT
SCREW 6.5MMX25MM (Screw) ×2 IMPLANT
STEM FEM ACTIS HIGH SZ8 (Stem) ×2 IMPLANT
SUT MNCRL AB 4-0 PS2 18 (SUTURE) ×2 IMPLANT
SUT STRATAFIX 0 PDS 27 VIOLET (SUTURE) ×2
SUT VIC AB 1 CT1 36 (SUTURE) ×6 IMPLANT
SUT VIC AB 2-0 CT1 27 (SUTURE) ×4
SUT VIC AB 2-0 CT1 TAPERPNT 27 (SUTURE) ×2 IMPLANT
SUTURE STRATFX 0 PDS 27 VIOLET (SUTURE) ×1 IMPLANT
TRAY FOLEY MTR SLVR 16FR STAT (SET/KITS/TRAYS/PACK) IMPLANT
TUBE SUCTION HIGH CAP CLEAR NV (SUCTIONS) ×2 IMPLANT
WATER STERILE IRR 1000ML POUR (IV SOLUTION) ×2 IMPLANT

## 2020-08-12 NOTE — Op Note (Signed)
NAME:  FILIP TRIMMER                ACCOUNT NO.: [id number]      MEDICAL RECORD NO.: [id number]      FACILITY:  [facility]      PHYSICIAN:  [name]  DATE OF BIRTH:  1945-11-03     DATE OF PROCEDURE:  08/12/2020                                 OPERATIVE REPORT         PREOPERATIVE DIAGNOSIS: Right  hip osteoarthritis.      POSTOPERATIVE DIAGNOSIS:  Right hip osteoarthritis.      PROCEDURE:  Right total hip replacement through an anterior approach   utilizing DePuy THR system, component size 54 mm pinnacle cup, a size 36+4 neutral   Altrex liner, a size 8 Hi Actis stem with a 36+5 delta ceramic   ball.      SURGEON:  Pietro Cassis. [name], M.D.      ASSISTANT:  Costella Hatcher, PA-C     ANESTHESIA:  Spinal.      SPECIMENS:  None.      COMPLICATIONS:  None.      BLOOD LOSS:  400 cc     DRAINS:  None.      INDICATION OF THE PROCEDURE:  Clayton [name] is a 75 y.o. male who had   presented to office for evaluation of right hip pain.  Radiographs revealed   progressive degenerative changes with bone-on-bone   articulation of the  hip joint, including subchondral cystic changes and osteophytes.  The patient had painful limited range of   motion significantly affecting their overall quality of life and function.  The patient was failing to    respond to conservative measures including medications and/or injections and activity modification and at this point was ready   to proceed with more definitive measures.  Consent was obtained for   benefit of pain relief.  Specific risks of infection, DVT, component   failure, dislocation, neurovascular injury, and need for revision surgery were reviewed in the office as well discussion of   the anterior versus posterior approach were reviewed.     PROCEDURE IN DETAIL:  The patient was brought to operative theater.   Once adequate anesthesia, preoperative antibiotics, 2 gm of Ancef, 1 gm of Tranexamic Acid, and 10  mg of Decadron were administered, the patient was positioned supine on the [employer] table.  Once the patient was safely positioned with adequate padding of boney prominences we predraped out the hip, and used fluoroscopy to confirm orientation of the pelvis.      The right hip was then prepped and draped from proximal iliac crest to   mid thigh with a shower curtain technique.      Time-out was performed identifying the patient, planned procedure, and the appropriate extremity.     An incision was then made 2 cm lateral to the   anterior superior iliac spine extending over the orientation of the   tensor fascia lata muscle and sharp dissection was carried down to the   fascia of the muscle.      The fascia was then incised.  The muscle belly was identified and swept   laterally and retractor placed along the superior neck.  Following   cauterization of the circumflex vessels and removing some  pericapsular   fat, a second cobra retractor was placed on the inferior neck.  A T-capsulotomy was made along the line of the   superior neck to the trochanteric fossa, then extended proximally and   distally.  Tag sutures were placed and the retractors were then placed   intracapsular.  We then identified the trochanteric fossa and   orientation of my neck cut and then made a neck osteotomy with the femur on traction.  The femoral   head was removed without difficulty or complication.  Traction was let   off and retractors were placed posterior and anterior around the   acetabulum.      The labrum and foveal tissue were debrided.  I began reaming with a 46 mm   reamer and reamed up to 53 mm reamer with good bony bed preparation and a 54 mm  cup was chosen.  The final 54 mm Pinnacle cup was then impacted under fluoroscopy to confirm the depth of penetration and orientation with respect to   Abduction and forward flexion.  A screw was placed into the ilium followed by the hole eliminator.  The final    36+4 neutral Altrex liner was impacted with good visualized rim fit.  The cup was positioned anatomically within the acetabular portion of the pelvis.      At this point, the femur was rolled to 100 degrees.  Further capsule was   released off the inferior aspect of the femoral neck.  I then   released the superior capsule proximally.  With the leg in a neutral position the hook was placed laterally   along the femur under the vastus lateralis origin and elevated manually and then held in position using the hook attachment on the bed.  The leg was then extended and adducted with the leg rolled to 100   degrees of external rotation.  Retractors were placed along the medial calcar and posteriorly over the greater trochanter.  Once the proximal femur was fully   exposed, I used a box osteotome to set orientation.  I then began   broaching with the starting chili pepper broach and passed this by hand and then broached up to 8.  With the 8 broach in place I chose a high offset neck and did several trial reductions.  The offset was appropriate, leg lengths   appeared to be equal best matched with the +5 head ball trial confirmed radiographically.   Given these findings, I went ahead and dislocated the hip, repositioned all   retractors and positioned the right hip in the extended and abducted position.  The final 8 Hi Actis stem was   chosen and it was impacted down to the level of neck cut.  Based on this   and the trial reductions, a final 36+5 delta ceramic ball was chosen and   impacted onto a clean and dry trunnion, and the hip was reduced.  The   hip had been irrigated throughout the case again at this point.  I did   reapproximate the superior capsular leaflet to the anterior leaflet   using #1 Vicryl.  The fascia of the   tensor fascia lata muscle was then reapproximated using #1 Vicryl and #0 Stratafix sutures.  The   remaining wound was closed with 2-0 Vicryl and running 4-0 Monocryl.    The hip was cleaned, dried, and dressed sterilely using Dermabond and   Aquacel dressing.  The patient was then brought   to  recovery room in stable condition tolerating the procedure well.    Costella Hatcher, PA-C was present for the entirety of the case involved from   preoperative positioning, perioperative retractor management, general   facilitation of the case, as well as primary wound closure as assistant.            Pietro Cassis [name], M.D.        08/12/2020 8:55 AM

## 2020-08-12 NOTE — Care Plan (Signed)
Ortho Bundle Case Management Note  Patient Details  Name: Clayton [name] MRN: [id number] Date of Birth: 21-May-1945                  R THA on 08/12/20. DCP: Home with wife, Clayton [name]. 1 story home with 1-2 steps. DME: No needs. Has RW & raised toilets, doesn't want 3in1. PT: HEP   DME Arranged:  N/A DME Agency:     HH Arranged:    [place] Agency:     Additional Comments: Please contact me with any questions of if this plan should need to change.  Marianne Sofia, RN,CCM [facility]  [phone] 08/12/2020, 8:14 AM

## 2020-08-12 NOTE — Anesthesia Procedure Notes (Signed)
Date/Time: 08/12/2020 8:50 AM Performed by: Sharlette Dense, CRNA Oxygen Delivery Method: Simple face mask

## 2020-08-12 NOTE — Anesthesia Postprocedure Evaluation (Signed)
Anesthesia Post Note  Patient: Clayton [name]  Procedure(s) Performed: TOTAL HIP ARTHROPLASTY ANTERIOR APPROACH (Right: Hip)     Patient location during evaluation: PACU Anesthesia Type: Spinal Level of consciousness: oriented and awake and alert Pain management: pain level controlled Vital Signs Assessment: post-procedure vital signs reviewed and stable Respiratory status: spontaneous breathing, respiratory function stable and patient connected to nasal cannula oxygen Cardiovascular status: blood pressure returned to baseline and stable Postop Assessment: no headache, no backache and no apparent nausea or vomiting Anesthetic complications: no   No notable events documented.  Last Vitals:  Vitals:   08/12/20 1115 08/12/20 1130  BP: 140/77 130/75  Pulse: (!) 48 (!) 49  Resp: (!) 22 17  Temp:    SpO2: 98% 97%    Last Pain:  Vitals:   08/12/20 1130  TempSrc:   PainSc: 0-No pain                 Lavella Myren S

## 2020-08-12 NOTE — Evaluation (Signed)
Physical Therapy Evaluation Patient Details Name: Clayton [name] MRN: [id number] DOB: 08-Mar-1945 Today's Date: 08/12/2020   History of Present Illness  75 yo male s/p R DA THA; PMH: L THA, HTN  Clinical Impression  Pt is s/p THA resulting in the deficits listed below (see PT Problem List).  Pt doing well, amb ~ 4'  with RW and min/guard for safety. Anticipate steady progress.  Pt will benefit from skilled PT to increase their independence and safety with mobility to allow discharge to the venue listed below.      Follow Up Recommendations Follow surgeon's recommendation for DC plan and follow-up therapies (HEP)    Equipment Recommendations  None recommended by PT    Recommendations for Other Services       Precautions / Restrictions Precautions Precautions: Fall Restrictions Weight Bearing Restrictions: No Other Position/Activity Restrictions: WBAT      Mobility  Bed Mobility Overal bed mobility: Needs Assistance Bed Mobility: Supine to Sit;Sit to Supine     Supine to sit: Min guard Sit to supine: Min guard   General bed mobility comments: min/guard for safety    Transfers Overall transfer level: Needs assistance Equipment used: Rolling walker (2 wheeled) Transfers: Sit to/from Stand Sit to Stand: Min guard         General transfer comment: cues for hand placement  Ambulation/Gait Ambulation/Gait assistance: Min guard Gait Distance (Feet): 80 Feet Assistive device: Rolling walker (2 wheeled) Gait Pattern/deviations: Step-to pattern;Step-through pattern     General Gait Details: cues for sequence and RW position  Stairs            Wheelchair Mobility    Modified Rankin (Stroke Patients Only)       Balance                                             Pertinent Vitals/Pain Pain Assessment: 0-10 Pain Score: 2  Pain Location: rigth hip Pain Descriptors / Indicators: Discomfort Pain Intervention(s): Limited activity  within patient's tolerance;Monitored during session;Ice applied    Home Living Family/patient expects to be discharged to:: Private residence Living Arrangements: Spouse/significant other Available Help at Discharge: Family   Home Access: Stairs to enter Entrance Stairs-Rails: Right Entrance Stairs-Number of Steps: 3 Home Layout: One level Home Equipment: Environmental consultant - 2 wheels      Prior Function Level of Independence: Independent               Hand Dominance        Extremity/Trunk Assessment   Upper Extremity Assessment Upper Extremity Assessment: Overall WFL for tasks assessed    Lower Extremity Assessment Lower Extremity Assessment: RLE deficits/detail RLE Deficits / Details: AAROM grossly WFL, knee and hip grossly 2+/5, limited by post op discomfort       Communication   Communication: No difficulties  Cognition Arousal/Alertness: Awake/alert Behavior During Therapy: WFL for tasks assessed/performed Overall Cognitive Status: Within Functional Limits for tasks assessed                                        General Comments      Exercises     Assessment/Plan    PT Assessment Patient needs continued PT services  PT Problem List Decreased strength;Decreased range of motion;Decreased activity tolerance;Decreased balance;Decreased mobility;Decreased  knowledge of use of DME;Pain       PT Treatment Interventions DME instruction;Therapeutic activities;Gait training;Functional mobility training;Therapeutic exercise;Patient/family education;Balance training;Stair training    PT Goals (Current goals can be found in the Care Plan section)  Acute Rehab PT Goals Patient Stated Goal: back to doign everything PT Goal Formulation: With patient Time For Goal Achievement: 08/25/20 Potential to Achieve Goals: Good    Frequency 7X/week   Barriers to discharge        Co-evaluation               AM-PAC PT "6 Clicks" Mobility  Outcome  Measure Help needed turning from your back to your side while in a flat bed without using bedrails?: A Little Help needed moving from lying on your back to sitting on the side of a flat bed without using bedrails?: A Little Help needed moving to and from a bed to a chair (including a wheelchair)?: A Little Help needed standing up from a chair using your arms (e.g., wheelchair or bedside chair)?: A Little Help needed to walk in hospital room?: A Little Help needed climbing 3-5 steps with a railing? : A Little 6 Click Score: 18    End of Session Equipment Utilized During Treatment: Gait belt Activity Tolerance: Patient tolerated treatment well Patient left: with call bell/phone within reach;with bed alarm set;in bed   PT Visit Diagnosis: Other abnormalities of gait and mobility (R26.89);Difficulty in walking, not elsewhere classified (R26.2)    Time: [id number] PT Time Calculation (min) (ACUTE ONLY): 24 min   Charges:   PT Evaluation $PT Eval Low Complexity: 1 Low PT Treatments $Gait Training: 8-22 mins       Baxter Flattery, PT  Acute Rehab Dept ([place]) [phone] Pager [phone]  08/12/2020   [facility] 08/12/2020, 4:06 PM

## 2020-08-12 NOTE — Anesthesia Procedure Notes (Signed)
Spinal  Patient location during procedure: OR Start time: 08/12/2020 8:50 AM End time: 08/12/2020 8:54 AM Reason for block: surgical anesthesia Staffing Anesthesiologist: Myrtie Soman, MD Preanesthetic Checklist Completed: patient identified, IV checked, site marked, risks and benefits discussed, surgical consent, monitors and equipment checked, pre-op evaluation and timeout performed Spinal Block Patient position: sitting Prep: DuraPrep Patient monitoring: heart rate, cardiac monitor, continuous pulse ox and blood pressure Approach: midline Location: L3-4 Injection technique: single-shot Needle Needle type: Sprotte  Needle gauge: 24 G Needle length: 9 cm Assessment Sensory level: T6 Events: CSF return

## 2020-08-12 NOTE — Discharge Instructions (Signed)

## 2020-08-12 NOTE — Transfer of Care (Signed)
Immediate Anesthesia Transfer of Care Note  Patient: Clayton [name]  Procedure(s) Performed: TOTAL HIP ARTHROPLASTY ANTERIOR APPROACH (Right: Hip)  Patient Location: PACU  Anesthesia Type:Spinal  Level of Consciousness: awake, alert  and oriented  Airway & Oxygen Therapy: Patient Spontanous Breathing and Patient connected to face mask oxygen  Post-op Assessment: Report given to RN and Post -op Vital signs reviewed and stable  Post vital signs: Reviewed and stable  Last Vitals:  Vitals Value Taken Time  BP 111/71 08/12/20 1036  Temp    Pulse 51 08/12/20 1037  Resp 16 08/12/20 1037  SpO2 100 % 08/12/20 1037  Vitals shown include unvalidated device data.  Last Pain:  Vitals:   08/12/20 0649  TempSrc:   PainSc: 0-No pain         Complications: No notable events documented.

## 2020-08-12 NOTE — Anesthesia Preprocedure Evaluation (Signed)
Anesthesia Evaluation  Patient identified by MRN, date of birth, ID band Patient awake    Reviewed: Allergy & Precautions, NPO status , Patient's Chart, lab work & pertinent test results  Airway Mallampati: II  TM Distance: >3 FB Neck ROM: Full    Dental no notable dental hx.    Pulmonary neg pulmonary ROS,    Pulmonary exam normal breath sounds clear to auscultation       Cardiovascular hypertension, Pt. on medications and Pt. on home beta blockers Normal cardiovascular exam+ dysrhythmias Atrial Fibrillation  Rhythm:Regular Rate:Normal     Neuro/Psych negative neurological ROS  negative psych ROS   GI/Hepatic negative GI ROS, Neg liver ROS,   Endo/Other  negative endocrine ROS  Renal/GU negative Renal ROS  negative genitourinary   Musculoskeletal negative musculoskeletal ROS (+)   Abdominal   Peds negative pediatric ROS (+)  Hematology negative hematology ROS (+)   Anesthesia Other Findings   Reproductive/Obstetrics negative OB ROS                            Anesthesia Physical Anesthesia Plan  ASA: 3  Anesthesia Plan: Spinal   Post-op Pain Management:    Induction: Intravenous  PONV Risk Score and Plan:   Airway Management Planned: Simple Face Mask  Additional Equipment:   Intra-op Plan:   Post-operative Plan:   Informed Consent: I have reviewed the patients History and Physical, chart, labs and discussed the procedure including the risks, benefits and alternatives for the proposed anesthesia with the patient or authorized representative who has indicated his/her understanding and acceptance.     Dental advisory given  Plan Discussed with: CRNA and Surgeon  Anesthesia Plan Comments:         Anesthesia Quick Evaluation

## 2020-08-13 ENCOUNTER — Encounter (HOSPITAL_COMMUNITY): Payer: Self-pay | Admitting: Orthopedic Surgery

## 2020-08-13 DIAGNOSIS — Z7901 Long term (current) use of anticoagulants: Secondary | ICD-10-CM | POA: Diagnosis not present

## 2020-08-13 DIAGNOSIS — Z7984 Long term (current) use of oral hypoglycemic drugs: Secondary | ICD-10-CM | POA: Diagnosis not present

## 2020-08-13 DIAGNOSIS — I10 Essential (primary) hypertension: Secondary | ICD-10-CM | POA: Diagnosis not present

## 2020-08-13 DIAGNOSIS — I4819 Other persistent atrial fibrillation: Secondary | ICD-10-CM | POA: Diagnosis not present

## 2020-08-13 DIAGNOSIS — R7303 Prediabetes: Secondary | ICD-10-CM | POA: Diagnosis not present

## 2020-08-13 DIAGNOSIS — Z79899 Other long term (current) drug therapy: Secondary | ICD-10-CM | POA: Diagnosis not present

## 2020-08-13 DIAGNOSIS — Z96642 Presence of left artificial hip joint: Secondary | ICD-10-CM | POA: Diagnosis not present

## 2020-08-13 DIAGNOSIS — M1611 Unilateral primary osteoarthritis, right hip: Secondary | ICD-10-CM | POA: Diagnosis not present

## 2020-08-13 LAB — CBC
HCT: 37.5 % — ABNORMAL LOW (ref 39.0–52.0)
Hemoglobin: 12.7 g/dL — ABNORMAL LOW (ref 13.0–17.0)
MCH: 30.5 pg (ref 26.0–34.0)
MCHC: 33.9 g/dL (ref 30.0–36.0)
MCV: 89.9 fL (ref 80.0–100.0)
Platelets: 179 10*3/uL (ref 150–400)
RBC: 4.17 MIL/uL — ABNORMAL LOW (ref 4.22–5.81)
RDW: 12.7 % (ref 11.5–15.5)
WBC: 9.9 10*3/uL (ref 4.0–10.5)
nRBC: 0 % (ref 0.0–0.2)

## 2020-08-13 LAB — BASIC METABOLIC PANEL
Anion gap: 7 (ref 5–15)
BUN: 26 mg/dL — ABNORMAL HIGH (ref 8–23)
CO2: 23 mmol/L (ref 22–32)
Calcium: 9.4 mg/dL (ref 8.9–10.3)
Chloride: 107 mmol/L (ref 98–111)
Creatinine, Ser: 1.45 mg/dL — ABNORMAL HIGH (ref 0.61–1.24)
GFR, Estimated: 51 mL/min — ABNORMAL LOW (ref >60)
Glucose, Bld: 211 mg/dL — ABNORMAL HIGH (ref 70–99)
Potassium: 4.3 mmol/L (ref 3.5–5.1)
Sodium: 137 mmol/L (ref 135–145)

## 2020-08-13 LAB — GLUCOSE, CAPILLARY
Glucose-Capillary: 164 mg/dL — ABNORMAL HIGH (ref 70–99)
Glucose-Capillary: 207 mg/dL — ABNORMAL HIGH (ref 70–99)

## 2020-08-13 MED ORDER — HYDROCODONE-ACETAMINOPHEN 5-325 MG PO TABS: 1.0000 | ORAL_TABLET | Freq: Four times a day (QID) | ORAL | 0 refills | Status: DC | PRN

## 2020-08-13 MED ORDER — POLYETHYLENE GLYCOL 3350 17 G PO PACK
17.0000 g | PACK | Freq: Every day | ORAL | 0 refills | Status: DC | PRN
Start: 2020-08-13 — End: 2020-09-23

## 2020-08-13 MED ORDER — METHOCARBAMOL 500 MG PO TABS: 500.0000 mg | ORAL_TABLET | Freq: Four times a day (QID) | ORAL | 0 refills | Status: DC | PRN

## 2020-08-13 MED ORDER — DOCUSATE SODIUM 100 MG PO CAPS: 100.0000 mg | ORAL_CAPSULE | Freq: Two times a day (BID) | ORAL | 0 refills | Status: DC

## 2020-08-13 NOTE — Progress Notes (Signed)
Physical Therapy Treatment Patient Details Name: Clayton [name] MRN: [id number] DOB: 11-02-1945 Today's Date: 08/13/2020    History of Present Illness 75 yo male s/p R DA THA; PMH: L THA, HTN    PT Comments    POD # 1 pm session Assisted with amb an increased distance in hallway.  Assisted to bathroom then completed HEP performing all standing TE's following handout. Addressed all mobility questions, discussed appropriate activity, educated on use of ICE.  Pt ready for D/C to home.   Follow Up Recommendations  Follow surgeon's recommendation for DC plan and follow-up therapies;Other (comment) (HEP)     Equipment Recommendations  None recommended by PT    Recommendations for Other Services       Precautions / Restrictions Precautions Precautions: Fall Restrictions Weight Bearing Restrictions: No RLE Weight Bearing: Weight bearing as tolerated    Mobility  Bed Mobility Overal bed mobility: Needs Assistance Bed Mobility: Supine to Sit     Supine to sit: Supervision     General bed mobility comments: Pt OOB in recliner    Transfers Overall transfer level: Needs assistance Equipment used: Rolling walker (2 wheeled) Transfers: Sit to/from [employer] Sit to Stand: Supervision Stand pivot transfers: Supervision       General transfer comment: one VC on safety with turns and to "reach back" prior to sit for increased control  Ambulation/Gait Ambulation/Gait assistance: Supervision Gait Distance (Feet): 155 Feet Assistive device: Rolling walker (2 wheeled) Gait Pattern/deviations: Step-to pattern;Step-through pattern Gait velocity: decreased   General Gait Details: pt tolerated an increased distance   Stairs Stairs: Yes Stairs assistance: Supervision;Min guard Stair Management: One rail Left;Step to pattern;Forwards Number of Stairs: 4 General stair comments: 25% VC's on proper sequencing as well as safety using B UE's on one  rail   Wheelchair Mobility    Modified Rankin (Stroke Patients Only)       Balance                                            Cognition Arousal/Alertness: Awake/alert Behavior During Therapy: WFL for tasks assessed/performed Overall Cognitive Status: Within Functional Limits for tasks assessed                                 General Comments: AxO x 3 very motivated and educated      Exercises  05 reps all standing TE's following Surgical Hip HEP handout    General Comments        Pertinent Vitals/Pain Pain Assessment: 0-10 Pain Score: 5  Pain Location: rigth hip Pain Descriptors / Indicators: Discomfort;Tightness;Operative site guarding Pain Intervention(s): Monitored during session;Repositioned;Premedicated before session;Ice applied    Home Living                      Prior Function            PT Goals (current goals can now be found in the care plan section) Progress towards PT goals: Progressing toward goals    Frequency    7X/week      PT Plan Current plan remains appropriate    Co-evaluation              AM-PAC PT "6 Clicks" Mobility   Outcome Measure  Help needed turning from your back  to your side while in a flat bed without using bedrails?: A Little Help needed moving from lying on your back to sitting on the side of a flat bed without using bedrails?: A Little Help needed moving to and from a bed to a chair (including a wheelchair)?: A Little Help needed standing up from a chair using your arms (e.g., wheelchair or bedside chair)?: A Little Help needed to walk in hospital room?: A Little Help needed climbing 3-5 steps with a railing? : A Little 6 Click Score: 18    End of Session Equipment Utilized During Treatment: Gait belt Activity Tolerance: Patient tolerated treatment well Patient left: with call bell/phone within reach;in chair;with chair alarm set Nurse Communication: Mobility  status (pt will need another PT session prior to D/C to home) PT Visit Diagnosis: Other abnormalities of gait and mobility (R26.89);Difficulty in walking, not elsewhere classified (R26.2)     Time: [id number] PT Time Calculation (min) (ACUTE ONLY): 14 min  Charges:  $Gait Training: 8-22 mins                     Rica Koyanagi  PTA Acute  Rehabilitation Services Pager      [phone] Office      [phone]

## 2020-08-13 NOTE — Progress Notes (Signed)
   Subjective: 1 Day Post-Op Procedure(s) (LRB): TOTAL HIP ARTHROPLASTY ANTERIOR APPROACH (Right) Patient reports pain as mild.   Patient seen in rounds by Dr. [name]. Patient is well, and has had no acute complaints or problems other than discomfort in the right hip. Ambulated 80 feet with PT. No acute events overnight. Denies CP, SHOB, N/V.  We will continue  therapy today.   Objective: Vital signs in last 24 hours: Temp:  [97.5 F (36.4 C)-98.4 F (36.9 C)] 98 F (36.7 C) (07/27 0529) Pulse Rate:  [43-88] 71 (07/27 0529) Resp:  [15-22] 18 (07/27 0529) BP: (111-147)/(71-94) 135/79 (07/27 0529) SpO2:  [95 %-100 %] 99 % (07/27 0529) Weight:  [116.7 kg] 116.7 kg (07/26 1442)  Intake/Output from previous day:  Intake/Output Summary (Last 24 hours) at 08/13/2020 0755 Last data filed at 08/13/2020 0524 Gross per 24 hour  Intake 2078.36 ml  Output 2600 ml  Net -521.64 ml     Intake/Output this shift: No intake/output data recorded.  Labs: Recent Labs    08/12/20 1046 08/13/20 0322  HGB 12.0* 12.7*   Recent Labs    08/12/20 1046 08/13/20 0322  WBC 5.5 9.9  RBC 3.92* 4.17*  HCT 36.4* 37.5*  PLT 161 179   Recent Labs    08/12/20 1046 08/13/20 0322  NA 137 137  K 4.5 4.3  CL 109 107  CO2 24 23  BUN 30* 26*  CREATININE 1.52* 1.45*  GLUCOSE 121* 211*  CALCIUM 9.1 9.4   No results for input(s): LABPT, INR in the last 72 hours.  Exam: General - Patient is Alert and Oriented Extremity - Neurologically intact Sensation intact distally Intact pulses distally Dorsiflexion/Plantar flexion intact Dressing - dressing C/D/I Motor Function - intact, moving foot and toes well on exam.   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 Humphrey Rolls; CPAP titration 11/12/04- Dr. Humphrey Rolls    Assessment/Plan: 1 Day Post-Op Procedure(s) (LRB): TOTAL HIP ARTHROPLASTY ANTERIOR APPROACH (Right) Active Problems:   S/P right total hip arthroplasty  Estimated body mass index is 32.16 kg/m as calculated from the following:   Height as of this encounter: '6\' 3"'$  (1.905 m).   Weight as of this encounter: 116.7 kg. Advance diet Up with therapy D/C IV fluids  DVT Prophylaxis - Aspirin Weight bearing as tolerated.  Plan is to go Home after hospital stay. Plan for discharge today following 1-2 sessions of therapy as long as he is meeting his goals. Follow up in the office in 2 weeks.   Griffith Citron, PA-C Orthopedic Surgery [phone] 08/13/2020, 7:55 AM

## 2020-08-13 NOTE — Progress Notes (Signed)
Physical Therapy Treatment Patient Details Name: Clayton [name] MRN: [id number] DOB: 04-02-1945 Today's Date: 08/13/2020    History of Present Illness 75 yo male s/p R DA THA; PMH: L THA, HTN    PT Comments    POD # 1 am session General Comments: AxO x 3 very motivated and educated.  Assisted OOB.  General bed mobility comments: demonstarted and instructed use of belt to self assist LE.  General transfer comment: one VC on safety with turns and to "reach back" prior to sit for increased control.  General Gait Details: <25% VC's on proper sequencing as well as proper walker to self distance.  General stair comments: 25% VC's on proper sequencing as well as safety using B UE's on one rail.  Then returned to room to perform some TE's following HEP handout.  Instructed on proper tech, freq as well as use of ICE.   Pt will need another PT session to complete HEP and increase gait distance.    Follow Up Recommendations  Follow surgeon's recommendation for DC plan and follow-up therapies;Other (comment) (HEP)     Equipment Recommendations  None recommended by PT    Recommendations for Other Services       Precautions / Restrictions Precautions Precautions: Fall Restrictions Weight Bearing Restrictions: No RLE Weight Bearing: Weight bearing as tolerated    Mobility  Bed Mobility Overal bed mobility: Needs Assistance Bed Mobility: Supine to Sit     Supine to sit: Supervision     General bed mobility comments: demonstarted and instructed use of belt to self assist LE    Transfers Overall transfer level: Needs assistance Equipment used: Rolling walker (2 wheeled) Transfers: Sit to/from [employer] Transfers Sit to Stand: Supervision Stand pivot transfers: Supervision       General transfer comment: one VC on safety with turns and to "reach back" prior to sit for increased control  Ambulation/Gait Ambulation/Gait assistance: Supervision Gait Distance (Feet): 125  Feet Assistive device: Rolling walker (2 wheeled) Gait Pattern/deviations: Step-to pattern;Step-through pattern Gait velocity: decreased   General Gait Details: <25% VC's on proper sequencing as well as proper walker to self distance   Stairs Stairs: Yes Stairs assistance: Supervision;Min guard Stair Management: One rail Left;Step to pattern;Forwards Number of Stairs: 4 General stair comments: 25% VC's on proper sequencing as well as safety using B UE's on one rail   Wheelchair Mobility    Modified Rankin (Stroke Patients Only)       Balance                                            Cognition Arousal/Alertness: Awake/alert Behavior During Therapy: WFL for tasks assessed/performed Overall Cognitive Status: Within Functional Limits for tasks assessed                                 General Comments: AxO x 3 very motivated and educated      Exercises  Total Hip Replacement TE's following HEP Handout 10 reps ankle pumps 05 reps knee presses 05 reps heel slides 05 reps SAQ's 05 reps ABD Instructed how to use a belt loop to assist  Followed by ICE     General Comments        Pertinent Vitals/Pain Pain Assessment: 0-10 Pain Score: 5  Pain Location: rigth hip  Pain Descriptors / Indicators: Discomfort;Tightness;Operative site guarding Pain Intervention(s): Monitored during session;Repositioned;Premedicated before session;Ice applied    Home Living                      Prior Function            PT Goals (current goals can now be found in the care plan section) Progress towards PT goals: Progressing toward goals    Frequency    7X/week      PT Plan Current plan remains appropriate    Co-evaluation              AM-PAC PT "6 Clicks" Mobility   Outcome Measure  Help needed turning from your back to your side while in a flat bed without using bedrails?: A Little Help needed moving from lying on your  back to sitting on the side of a flat bed without using bedrails?: A Little Help needed moving to and from a bed to a chair (including a wheelchair)?: A Little Help needed standing up from a chair using your arms (e.g., wheelchair or bedside chair)?: A Little Help needed to walk in hospital room?: A Little Help needed climbing 3-5 steps with a railing? : A Little 6 Click Score: 18    End of Session Equipment Utilized During Treatment: Gait belt Activity Tolerance: Patient tolerated treatment well Patient left: with call bell/phone within reach;in chair;with chair alarm set Nurse Communication: Mobility status (pt will need another PT session prior to D/C to home) PT Visit Diagnosis: Other abnormalities of gait and mobility (R26.89);Difficulty in walking, not elsewhere classified (R26.2)     Time: [id number] PT Time Calculation (min) (ACUTE ONLY): 24 min  Charges:  $Gait Training: 8-22 mins $Therapeutic Exercise: 8-22 mins                    Rica Koyanagi  PTA Acute  Rehabilitation Services Pager      [phone] Office      [phone]

## 2020-08-13 NOTE — Progress Notes (Signed)
Discharge instructions given to patient and all questions were answered.  

## 2020-08-13 NOTE — TOC Transition Note (Signed)
Transition of Care [facility]) - CM/SW Discharge Note  Patient Details  Name: Clayton [name] MRN: [id number] Date of Birth: 30-Jul-1945  Transition of Care [facility] CM/SW Contact:  Sherie Don, LCSW Phone Number: 08/13/2020, 10:29 AM  Clinical Narrative: Patient is expected to discharge after working with PT. CSW met with patient to confirm discharge plan. Patient will discharge home with a home exercise program (HEP). Patient has a rolling walker and high toilets at home, so there are no DME needs at this time. TOC signing off.  Final next level of care: Home/Self Care Barriers to Discharge: No Barriers Identified  Patient Goals and CMS Choice Patient states their goals for this hospitalization and ongoing recovery are:: Discharge home with HEP CMS Medicare.gov Compare Post Acute Care list provided to:: Patient Choice offered to / list presented to : NA  Discharge Plan and Services         DME Arranged: N/A DME Agency: NA  Readmission Risk Interventions No flowsheet data found.

## 2020-08-14 ENCOUNTER — Encounter: Payer: Self-pay | Admitting: Family Medicine

## 2020-08-19 ENCOUNTER — Telehealth: Payer: Self-pay | Admitting: Cardiology

## 2020-08-19 DIAGNOSIS — I4819 Other persistent atrial fibrillation: Secondary | ICD-10-CM

## 2020-08-19 NOTE — Telephone Encounter (Signed)
Returned call to patient. Questions answered. Aware I will send updated instructions via mychart. Appt for this Friday cancelled, aware pt does not need this prior to procedure. Patient verbalized understanding and agreeable to plan.

## 2020-08-19 NOTE — Telephone Encounter (Signed)
Patient had questions regarding his upcoming ablation 09/10/20. Please call.   The patient also sent a MyChart message

## 2020-08-20 ENCOUNTER — Telehealth: Payer: Self-pay | Admitting: Cardiology

## 2020-08-20 NOTE — Telephone Encounter (Signed)
Pt reports blurred vision every morning for about 3/more weeks. States it takes about 10 minutes to go away. It does NOT occur at other times throughout the day. Confirms he is taking Amiodarone 200 mg daily (started in June).  Advised to follow up with ophthalmologist first for evaluation. Asked to have any records of visit sent to [name] if need to readdress medication. Patient verbalized understanding and agreeable to plan.  Will forward to MD for FYI/different advisement.

## 2020-08-20 NOTE — Telephone Encounter (Signed)
Pt c/o medication issue:  1. Name of Medication: Amiodarone  2. How are you currently taking this medication (dosage and times per day)? 1 time a day  3. Are you having a reaction (difficulty breathing--STAT)? no  4. What is your medication issue? Blurred vision- which is one of the side effects

## 2020-08-21 NOTE — Discharge Summary (Signed)
Physician Discharge Summary   Patient ID: RAYON MCCHRISTIAN MRN: [id number] DOB/AGE: Dec 03, 1945 75 y.o.  Admit date: 08/12/2020 Discharge date: 08/13/2020  Primary Diagnosis: Right  hip osteoarthritis.  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 Humphrey Rolls; CPAP titration 11/12/04- Dr. Humphrey Rolls   Discharge Diagnoses:   Active Problems:   S/P right total hip arthroplasty  Estimated body mass index is 32.16 kg/m as calculated from the following:   Height as of this encounter: 6' 3"  (1.905 m).   Weight as of this encounter: 116.7 kg.  Procedure:  Procedure(s) (LRB): TOTAL HIP ARTHROPLASTY ANTERIOR APPROACH (Right)   Consults: None  HPI:  Clayton [name] is a 75 y.o. male who had  presented to office for evaluation of right hip pain.  Radiographs revealed  progressive degenerative changes with bone-on-bone  articulation of the  hip joint, including subchondral cystic changes and osteophytes.  The patient had painful limited range of  motion significantly affecting their overall quality of life and function.  The patient was failing to    respond to conservative measures including medications and/or injections and activity modification and at this point was ready  to proceed with more definitive measures.  Consent was obtained for  benefit of pain relief.  Specific risks of infection, DVT, component  failure, dislocation, neurovascular injury, and need for revision surgery were reviewed in the office as well discussion of  the anterior versus posterior approach were  reviewed.  Laboratory Data: Admission on 08/12/2020, Discharged on 08/13/2020  Component Date Value Ref Range Status   WBC 08/12/2020 5.5  4.0 - 10.5 K/uL Final   RBC 08/12/2020 3.92 (A) 4.22 - 5.81 MIL/uL Final   Hemoglobin 08/12/2020 12.0 (A) 13.0 - 17.0 g/dL Final   HCT 08/12/2020 36.4 (A) 39.0 - 52.0 % Final   MCV 08/12/2020 92.9  80.0 - 100.0 fL Final   MCH 08/12/2020 30.6  26.0 - 34.0 pg Final   MCHC 08/12/2020 33.0  30.0 - 36.0 g/dL Final   RDW 08/12/2020 13.1  11.5 - 15.5 % Final   Platelets 08/12/2020 161  150 - 400 K/uL Final   nRBC 08/12/2020 0.0  0.0 - 0.2 % Final   Performed at [facility], [place] [address] [place], [place] [address]   Sodium 08/12/2020 137  135 - 145 mmol/L Final   Potassium 08/12/2020 4.5  3.5 - 5.1 mmol/L Final   Chloride 08/12/2020 109  98 - 111 mmol/L Final   CO2 08/12/2020 24  22 - 32 mmol/L Final   Glucose, Bld 08/12/2020 121 (A) 70 - 99 mg/dL Final   Glucose reference range applies only to samples taken after fasting for at least 8 hours.   BUN 08/12/2020 30 (A) 8 - 23 mg/dL Final   Creatinine, Ser 08/12/2020 1.52 (A) 0.61 - 1.24 mg/dL Final   Calcium 08/12/2020 9.1  8.9 - 10.3 mg/dL Final   Total Protein 08/12/2020 5.6 (A) 6.5 - 8.1 g/dL Final  Albumin 08/12/2020 3.5  3.5 - 5.0 g/dL Final   AST 08/12/2020 23  15 - 41 U/L Final   ALT 08/12/2020 27  0 - 44 U/L Final   Alkaline Phosphatase 08/12/2020 44  38 - 126 U/L Final   Total Bilirubin 08/12/2020 0.8  0.3 - 1.2 mg/dL Final   GFR, Estimated 08/12/2020 48 (A) >60 mL/min Final   Comment: (NOTE) Calculated using the CKD-EPI Creatinine Equation (2021)    Anion gap 08/12/2020 4 (A) 5 - 15 Final   Performed at [facility], [place] [address], [place], [place] [address]   Glucose-Capillary 08/12/2020 191 (A) 70 - 99 mg/dL Final   Glucose reference range applies only to samples taken after fasting for at least 8 hours.   WBC 08/13/2020 9.9  4.0 - 10.5 K/uL  Final   RBC 08/13/2020 4.17 (A) 4.22 - 5.81 MIL/uL Final   Hemoglobin 08/13/2020 12.7 (A) 13.0 - 17.0 g/dL Final   HCT 08/13/2020 37.5 (A) 39.0 - 52.0 % Final   MCV 08/13/2020 89.9  80.0 - 100.0 fL Final   MCH 08/13/2020 30.5  26.0 - 34.0 pg Final   MCHC 08/13/2020 33.9  30.0 - 36.0 g/dL Final   RDW 08/13/2020 12.7  11.5 - 15.5 % Final   Platelets 08/13/2020 179  150 - 400 K/uL Final   nRBC 08/13/2020 0.0  0.0 - 0.2 % Final   Performed at [facility], [place] [address], [place], [place] [address]   Sodium 08/13/2020 137  135 - 145 mmol/L Final   Potassium 08/13/2020 4.3  3.5 - 5.1 mmol/L Final   Chloride 08/13/2020 107  98 - 111 mmol/L Final   CO2 08/13/2020 23  22 - 32 mmol/L Final   Glucose, Bld 08/13/2020 211 (A) 70 - 99 mg/dL Final   Glucose reference range applies only to samples taken after fasting for at least 8 hours.   BUN 08/13/2020 26 (A) 8 - 23 mg/dL Final   Creatinine, Ser 08/13/2020 1.45 (A) 0.61 - 1.24 mg/dL Final   Calcium 08/13/2020 9.4  8.9 - 10.3 mg/dL Final   GFR, Estimated 08/13/2020 51 (A) >60 mL/min Final   Comment: (NOTE) Calculated using the CKD-EPI Creatinine Equation (2021)    Anion gap 08/13/2020 7  5 - 15 Final   Performed at [facility], [place] [address], [place], [place] [address]   Glucose-Capillary 08/12/2020 243 (A) 70 - 99 mg/dL Final   Glucose reference range applies only to samples taken after fasting for at least 8 hours.   Glucose-Capillary 08/13/2020 164 (A) 70 - 99 mg/dL Final   Glucose reference range applies only to samples taken after fasting for at least 8 hours.   Glucose-Capillary 08/13/2020 207 (A) 70 - 99 mg/dL Final   Glucose reference range applies only to samples taken after fasting for at least 8 hours.  Orders Only on 08/11/2020  Component Date Value Ref Range Status   Glucose 08/11/2020 106 (A) 65 - 99 mg/dL Final   BUN 08/11/2020 26  8 - 27 mg/dL Final   Creatinine, Ser 08/11/2020 1.52 (A)  0.76 - 1.27 mg/dL Final   eGFR 08/11/2020 48 (A) >59 mL/min/1.73 Final   BUN/Creatinine Ratio 08/11/2020 17  10 - 24 Final   Sodium 08/11/2020 140  134 - 144 mmol/L Final   Potassium 08/11/2020 4.9  3.5 - 5.2 mmol/L Final   Chloride 08/11/2020 108 (A) 96 - 106 mmol/L Final   CO2 08/11/2020  20  20 - 29 mmol/L Final   Calcium 08/11/2020 10.3 (A) 8.6 - 10.2 mg/dL Final  Hospital Outpatient Visit on 08/08/2020  Component Date Value Ref Range Status   SARS Coronavirus 2 08/08/2020 NEGATIVE  NEGATIVE Final   Comment: (NOTE) SARS-CoV-2 target nucleic acids are NOT DETECTED.  The SARS-CoV-2 RNA is generally detectable in upper and lower respiratory specimens during the acute phase of infection. Negative results do not preclude SARS-CoV-2 infection, do not rule out co-infections with other pathogens, and should not be used as the sole basis for treatment or other patient management decisions. Negative results must be combined with clinical observations, patient history, and epidemiological information. The expected result is Negative.  Fact Sheet for Patients: SugarRoll.be  Fact Sheet for Healthcare Providers: https://www.woods-mathews.com/  This test is not yet approved or cleared by the [place] FDA and  has been authorized for detection and/or diagnosis of SARS-CoV-2 by FDA under an Emergency Use Authorization (EUA). This EUA will remain  in effect (meaning this test can be used) for the duration of the COVID-19 declaration under Se                          ction 564(b)(1) of the Act, 21 U.S.C. section 360bbb-3(b)(1), unless the authorization is terminated or revoked sooner.  Performed at [facility], [place] [address], [place], [place] [address]   Hospital Outpatient Visit on 08/06/2020  Component Date Value Ref Range Status   MRSA, PCR 08/06/2020 NEGATIVE  NEGATIVE Final   Staphylococcus aureus 08/06/2020 NEGATIVE  NEGATIVE  Final   Comment: (NOTE) The Xpert SA Assay (FDA approved for NASAL specimens in patients 18 years of age and older), is one component of a comprehensive surveillance program. It is not intended to diagnose infection nor to guide or monitor treatment. Performed at [facility], [place] [address], [place], [place] [address]    Prothrombin Time 08/06/2020 14.1  11.4 - 15.2 seconds Final   INR 08/06/2020 1.1  0.8 - 1.2 Final   Comment: (NOTE) INR goal varies based on device and disease states. Performed at [facility], [place] [address], [place], [place] [address]    aPTT 08/06/2020 33  24 - 36 seconds Final   Performed at [facility], [place] [address] [place], [place] [address]   ABO/RH(D) 08/06/2020 O POS   Final   Antibody Screen 08/06/2020 NEG   Final   Sample Expiration 08/06/2020 08/15/2020,2359   Final   Extend sample reason 08/06/2020    Final                   Value:NO TRANSFUSIONS OR PREGNANCY IN THE PAST 3 MONTHS Performed at [place] [address], [place], [place] [address]   Office Visit on 07/17/2020  Component Date Value Ref Range Status   TSH 08/01/2020 2.140  0.450 - 4.500 uIU/mL Final   WBC 08/01/2020 5.7  3.4 - 10.8 x10E3/uL Final   RBC 08/01/2020 4.90  4.14 - 5.80 x10E6/uL Final   Hemoglobin 08/01/2020 14.7  13.0 - 17.7 g/dL Final   Hematocrit 08/01/2020 44.1  37.5 - 51.0 % Final   MCV 08/01/2020 90  79 - 97 fL Final   MCH 08/01/2020 30.0  26.6 - 33.0 pg Final   MCHC 08/01/2020 33.3  31.5 - 35.7 g/dL Final   RDW 08/01/2020 13.3  11.6 - 15.4 % Final   Platelets  08/01/2020 217  150 - 450 x10E3/uL Final   Glucose 08/01/2020 103 (A) 65 - 99 mg/dL Final   BUN 08/01/2020 29 (A) 8 - 27 mg/dL Final   Creatinine, Ser 08/01/2020 1.65 (A) 0.76 - 1.27 mg/dL Final   eGFR 08/01/2020 44 (A) >59 mL/min/1.73 Final   BUN/Creatinine Ratio 08/01/2020 18  10 - 24 Final   Sodium 08/01/2020 139  134 - 144  mmol/L Final   Potassium 08/01/2020 4.8  3.5 - 5.2 mmol/L Final   Chloride 08/01/2020 103  96 - 106 mmol/L Final   CO2 08/01/2020 21  20 - 29 mmol/L Final   Calcium 08/01/2020 10.1  8.6 - 10.2 mg/dL Final   Total Protein 08/01/2020 6.6  6.0 - 8.5 g/dL Final   Albumin 08/01/2020 4.6  3.7 - 4.7 g/dL Final   Globulin, Total 08/01/2020 2.0  1.5 - 4.5 g/dL Final   Albumin/Globulin Ratio 08/01/2020 2.3 (A) 1.2 - 2.2 Final   Bilirubin Total 08/01/2020 1.3 (A) 0.0 - 1.2 mg/dL Final   Alkaline Phosphatase 08/01/2020 69  44 - 121 IU/L Final   AST 08/01/2020 25  0 - 40 IU/L Final   ALT 08/01/2020 29  0 - 44 IU/L Final   Cholesterol, Total 08/01/2020 120  100 - 199 mg/dL Final   Triglycerides 08/01/2020 81  0 - 149 mg/dL Final   HDL 08/01/2020 54  >39 mg/dL Final   VLDL Cholesterol Cal 08/01/2020 16  5 - 40 mg/dL Final   LDL Chol Calc (NIH) 08/01/2020 50  0 - 99 mg/dL Final   Chol/HDL Ratio 08/01/2020 2.2  0.0 - 5.0 ratio Final   Comment:                                   T. Chol/HDL Ratio                                             Men  Women                               1/2 Avg.Risk  3.4    3.3                                   Avg.Risk  5.0    4.4                                2X Avg.Risk  9.6    7.1                                3X Avg.Risk 23.4   11.0   Office Visit on 07/04/2020  Component Date Value Ref Range Status   Color, Urine 07/04/2020 YELLOW  YELLOW Final   APPearance 07/04/2020 CLEAR  CLEAR Final   Specific Gravity, Urine 07/04/2020 1.026  1.001 - 1.035 Final   pH 07/04/2020 5.5  5.0 - 8.0 Final   Glucose, UA 07/04/2020 NEGATIVE  NEGATIVE Final   Bilirubin Urine 07/04/2020 NEGATIVE  NEGATIVE Final   Ketones, ur 07/04/2020 TRACE (A)  NEGATIVE Final   Hgb urine dipstick 07/04/2020 NEGATIVE  NEGATIVE Final   Protein, ur 07/04/2020 2+ (A) NEGATIVE Final   Nitrites, Initial 07/04/2020 NEGATIVE  NEGATIVE Final   Leukocyte Esterase 07/04/2020 NEGATIVE  NEGATIVE Final   WBC, UA  07/04/2020 NONE SEEN  0 - 5 /HPF Final   RBC / HPF 07/04/2020 NONE SEEN  0 - 2 /HPF Final   Squamous Epithelial / LPF 07/04/2020 NONE SEEN  < OR = 5 /HPF Final   Bacteria, UA 07/04/2020 NONE SEEN  NONE SEEN /HPF Final   Hyaline Cast 07/04/2020 NONE SEEN  NONE SEEN /LPF Final   Note 07/04/2020    Final   Comment: This urine was analyzed for the presence of WBC,  RBC, bacteria, casts, and other formed elements.  Only those elements seen were reported. . .    INR 07/04/2020 1.0   Final   Comment: Reference Range                     0.9-1.1 Moderate-intensity Warfarin Therapy 2.0-3.0 Higher-intensity Warfarin Therapy   3.0-4.0  .    Prothrombin Time 07/04/2020 10.6  9.0 - 11.5 sec Final   Comment: For additional information, please refer to http://education.questdiagnostics.com/faq/FAQ104 (This link is being provided for informational/ educational purposes only.)    Hgb A1c MFr Bld 07/04/2020 5.8 (A) <5.7 % of total Hgb Final   Comment: For someone without known diabetes, a hemoglobin  A1c value between 5.7% and 6.4% is consistent with prediabetes and should be confirmed with a  follow-up test. . For someone with known diabetes, a value <7% indicates that their diabetes is well controlled. A1c targets should be individualized based on duration of diabetes, age, comorbid conditions, and other considerations. . This assay result is consistent with an increased risk of diabetes. . Currently, no consensus exists regarding use of hemoglobin A1c for diagnosis of diabetes for children. .    Mean Plasma Glucose 07/04/2020 120  mg/dL Final   eAG (mmol/L) 07/04/2020 6.6  mmol/L Final   Reflexve Urine Culture 07/04/2020    Final   NO CULTURE INDICATED  Office Visit on 06/27/2020  Component Date Value Ref Range Status   Glucose 06/27/2020 96  65 - 99 mg/dL Final   BUN 06/27/2020 37 (A) 8 - 27 mg/dL Final   Creatinine, Ser 06/27/2020 1.57 (A) 0.76 - 1.27 mg/dL Final   eGFR  06/27/2020 46 (A) >59 mL/min/1.73 Final   BUN/Creatinine Ratio 06/27/2020 24  10 - 24 Final   Sodium 06/27/2020 141  134 - 144 mmol/L Final   Potassium 06/27/2020 4.9  3.5 - 5.2 mmol/L Final   Chloride 06/27/2020 108 (A) 96 - 106 mmol/L Final   CO2 06/27/2020 19 (A) 20 - 29 mmol/L Final   Calcium 06/27/2020 9.8  8.6 - 10.2 mg/dL Final   WBC 06/27/2020 7.4  3.4 - 10.8 x10E3/uL Final   RBC 06/27/2020 4.94  4.14 - 5.80 x10E6/uL Final   Hemoglobin 06/27/2020 14.8  13.0 - 17.7 g/dL Final   Hematocrit 06/27/2020 44.0  37.5 - 51.0 % Final   MCV 06/27/2020 89  79 - 97 fL Final   MCH 06/27/2020 30.0  26.6 - 33.0 pg Final   MCHC 06/27/2020 33.6  31.5 - 35.7 g/dL Final   RDW 06/27/2020 13.4  11.6 - 15.4 % Final   Platelets 06/27/2020 206  150 - 450 x10E3/uL Final   TSH 06/27/2020 1.430  0.450 - 4.500 uIU/mL Final  X-Rays:DG Pelvis Portable  Result Date: 08/12/2020 CLINICAL DATA:  Postop right hip replacement EXAM: PORTABLE PELVIS 1-2 VIEWS COMPARISON:  Earlier today. FINDINGS: New total hip replacement performed on the right. Components appear well positioned. No radiographically detectable complication. Previous hip replacement on the left IMPRESSION: New total hip replacement on the right, good appearance. Electronically Signed   By: Nelson Chimes M.D.   On: 08/12/2020 11:25   DG C-Arm 1-60 Min-No Report  Result Date: 08/12/2020 CLINICAL DATA:  75 year old male with right hip surgery EXAM: OPERATIVE RIGHT HIP (WITH PELVIS IF PERFORMED) 2 VIEWS TECHNIQUE: Fluoroscopic spot image(s) were submitted for interpretation post-operatively. COMPARISON:  12/26/2019, 11/13/2019 FINDINGS: Limited intraoperative fluoroscopic spot images of the right hip demonstrates interval right hip arthroplasty. No complicating features. IMPRESSION: Limited intraoperative fluoroscopic spot images demonstrates right hip arthroplasty with no complicating features. Please refer to the dictated operative report for full details  of intraoperative findings and procedure. Electronically Signed   By: Corrie Mckusick D.O.   On: 08/12/2020 10:40   DG HIP OPERATIVE UNILAT W OR W/O PELVIS RIGHT  Result Date: 08/12/2020 CLINICAL DATA:  75 year old male with right hip surgery EXAM: OPERATIVE RIGHT HIP (WITH PELVIS IF PERFORMED) 2 VIEWS TECHNIQUE: Fluoroscopic spot image(s) were submitted for interpretation post-operatively. COMPARISON:  12/26/2019, 11/13/2019 FINDINGS: Limited intraoperative fluoroscopic spot images of the right hip demonstrates interval right hip arthroplasty. No complicating features. IMPRESSION: Limited intraoperative fluoroscopic spot images demonstrates right hip arthroplasty with no complicating features. Please refer to the dictated operative report for full details of intraoperative findings and procedure. Electronically Signed   By: Corrie Mckusick D.O.   On: 08/12/2020 10:40    EKG: Orders placed or performed in visit on 07/17/20   EKG 12-Lead     Hospital Course: MARICUS TANZI is a 75 y.o. who was admitted to [facility]. They were brought to the operating room on 08/12/2020 and underwent Procedure(s): [place].  Patient tolerated the procedure well and was later transferred to the recovery room and then to the orthopaedic floor for postoperative care. They were given PO and IV analgesics for pain control following their surgery. They were given 24 hours of postoperative antibiotics of  Anti-infectives (From admission, onward)    Start     Dose/Rate Route Frequency Ordered Stop   08/12/20 1500  ceFAZolin (ANCEF) 2 g in sodium chloride 0.9 % 100 mL IVPB        2 g 200 mL/hr over 30 Minutes Intravenous Every 6 hours 08/12/20 1357 08/12/20 2122   08/12/20 0600  ceFAZolin (ANCEF) 2 g in sodium chloride 0.9 % 100 mL IVPB        2 g 200 mL/hr over 30 Minutes Intravenous On call to O.R. 08/12/20 2440 08/12/20 0901      and started on DVT prophylaxis in the form of  Aspirin.   PT and OT were ordered for total joint protocol. Discharge planning consulted to help with postop disposition and equipment needs.  Patient had a good night on the evening of surgery. They started to get up OOB with therapy on POD #0. Pt was seen during rounds and was ready to go home pending progress with therapy.He worked with therapy on POD #1 and was meeting his goals. Pt was discharged to home later that day in stable condition.  Diet: Regular diet Activity: WBAT Follow-up: in 2 weeks Disposition: Home Discharged Condition: good   Discharge Instructions     Call MD /  Call 911   Complete by: As directed    If you experience chest pain or shortness of breath, CALL 911 and be transported to the hospital emergency room.  If you develope a fever above 101 F, pus (white drainage) or increased drainage or redness at the wound, or calf pain, call your surgeon's office.   Change dressing   Complete by: As directed    Maintain surgical dressing until follow up in the clinic. If the edges start to pull up, may reinforce with tape. If the dressing is no longer working, may remove and cover with gauze and tape, but must keep the area dry and clean.  Call with any questions or concerns.   Constipation Prevention   Complete by: As directed    Drink plenty of fluids.  Prune juice may be helpful.  You may use a stool softener, such as Colace (over the counter) 100 mg twice a day.  Use MiraLax (over the counter) for constipation as needed.   Diet - low sodium heart healthy   Complete by: As directed    Increase activity slowly as tolerated   Complete by: As directed    Weight bearing as tolerated with assist device (walker, cane, etc) as directed, use it as long as suggested by your surgeon or therapist, typically at least 4-6 weeks.   Post-operative opioid taper instructions:   Complete by: As directed    POST-OPERATIVE OPIOID TAPER INSTRUCTIONS: It is important to wean off of your opioid  medication as soon as possible. If you do not need pain medication after your surgery it is ok to stop day one. Opioids include: Codeine, Hydrocodone(Norco, Vicodin), Oxycodone(Percocet, oxycontin) and hydromorphone amongst others.  Long term and even short term use of opiods can cause: Increased pain response Dependence Constipation Depression Respiratory depression And more.  Withdrawal symptoms can include Flu like symptoms Nausea, vomiting And more Techniques to manage these symptoms Hydrate well Eat regular healthy meals Stay active Use relaxation techniques(deep breathing, meditating, yoga) Do Not substitute Alcohol to help with tapering If you have been on opioids for less than two weeks and do not have pain than it is ok to stop all together.  Plan to wean off of opioids This plan should start within one week post op of your joint replacement. Maintain the same interval or time between taking each dose and first decrease the dose.  Cut the total daily intake of opioids by one tablet each day Next start to increase the time between doses. The last dose that should be eliminated is the evening dose.      TED hose   Complete by: As directed    Use stockings (TED hose) for 2 weeks on both leg(s).  You may remove them at night for sleeping.      Allergies as of 08/13/2020   No Known Allergies      Medication List     TAKE these medications    amiodarone 200 MG tablet Commonly known as: PACERONE Take 2 tablets (400 mg total) TWICE daily for 2 weeks then, take 1 tablet (200 mg total) TWICE daily for 2 weeks, then take 1 tablet ONCE daily   amLODipine 5 MG tablet Commonly known as: NORVASC TAKE 1.5 TABLETS BY MOUTH DAILY.   Coenzyme Q10 200 MG capsule Take 200 mg by mouth in the morning.   docusate sodium 100 MG capsule Commonly known as: COLACE Take 1 capsule (100 mg total) by mouth 2 (two) times  daily.   gabapentin 100 MG capsule Commonly known as:  NEURONTIN Take 2 capsules (200 mg total) by mouth at bedtime.   HYDROcodone-acetaminophen 5-325 MG tablet Commonly known as: NORCO/VICODIN Take 1-2 tablets by mouth every 6 (six) hours as needed for severe pain.   methocarbamol 500 MG tablet Commonly known as: ROBAXIN Take 1 tablet (500 mg total) by mouth every 6 (six) hours as needed for muscle spasms.   metoprolol succinate 100 MG 24 hr tablet Commonly known as: TOPROL-XL Take 100 mg by mouth every evening. Take with or immediately following a meal.   polyethylene glycol 17 g packet Commonly known as: MIRALAX / GLYCOLAX Take 17 g by mouth daily as needed for mild constipation.   spironolactone 25 MG tablet Commonly known as: ALDACTONE Take 0.5 tablets (12.5 mg total) by mouth every other day.   tamsulosin 0.4 MG Caps capsule Commonly known as: FLOMAX Take 0.4 mg by mouth in the morning.   triamcinolone cream 0.5 % Commonly known as: KENALOG APPLY 1 APPLICATION TOPICALLY 2 (TWO) TIMES DAILY. TO AFFECTED AREAS.   vitamin C 1000 MG tablet Take 1,000 mg by mouth in the morning.   zinc gluconate 50 MG tablet Take 50 mg by mouth in the morning.       ASK your doctor about these medications    Eliquis 5 MG Tabs tablet Generic drug: apixaban TAKE 1 TABLET BY MOUTH TWICE A DAY   ezetimibe 10 MG tablet Commonly known as: ZETIA TAKE 1 TABLET BY MOUTH EVERY DAY   Livalo 2 MG Tabs Generic drug: Pitavastatin Calcium TAKE 1 TABLET BY MOUTH EVERY DAY   metFORMIN 500 MG tablet Commonly known as: GLUCOPHAGE Take 1 tablet (500 mg total) by mouth daily with breakfast.   metoprolol succinate 50 MG 24 hr tablet Commonly known as: TOPROL-XL Take 1 tablet (50 mg total) by mouth daily. Take with or immediately following a meal.   olmesartan 40 MG tablet Commonly known as: Blanca 1 TABLET BY MOUTH EVERY DAY               Discharge Care Instructions  (From admission, onward)           Start     Ordered    08/13/20 0000  Change dressing       Comments: Maintain surgical dressing until follow up in the clinic. If the edges start to pull up, may reinforce with tape. If the dressing is no longer working, may remove and cover with gauze and tape, but must keep the area dry and clean.  Call with any questions or concerns.   08/13/20 0803            Follow-up Information     Paralee Cancel, MD. Schedule an appointment as soon as possible for a visit in 2 week(s).   Specialty: Orthopedic Surgery Contact information: [address] [place] [address] [phone]                 Signed: Griffith Citron, PA-C Orthopedic Surgery 08/21/2020, 3:52 PM

## 2020-08-22 ENCOUNTER — Ambulatory Visit: Payer: Medicare PPO | Admitting: Student

## 2020-08-26 ENCOUNTER — Other Ambulatory Visit: Payer: Self-pay | Admitting: Cardiovascular Disease

## 2020-09-01 ENCOUNTER — Other Ambulatory Visit: Payer: Self-pay

## 2020-09-01 ENCOUNTER — Encounter (INDEPENDENT_AMBULATORY_CARE_PROVIDER_SITE_OTHER): Payer: Self-pay | Admitting: Family Medicine

## 2020-09-01 ENCOUNTER — Ambulatory Visit (INDEPENDENT_AMBULATORY_CARE_PROVIDER_SITE_OTHER): Payer: Medicare PPO | Admitting: Family Medicine

## 2020-09-01 VITALS — BP 124/75 | HR 82 | Temp 97.7°F | Ht 75.0 in | Wt 251.0 lb

## 2020-09-01 DIAGNOSIS — F439 Reaction to severe stress, unspecified: Secondary | ICD-10-CM | POA: Diagnosis not present

## 2020-09-01 DIAGNOSIS — R7303 Prediabetes: Secondary | ICD-10-CM

## 2020-09-01 DIAGNOSIS — Z6835 Body mass index (BMI) 35.0-35.9, adult: Secondary | ICD-10-CM | POA: Diagnosis not present

## 2020-09-01 NOTE — Addendum Note (Signed)
Addended by: Stanton Kidney on: 09/01/2020 05:28 PM   Modules accepted: Orders

## 2020-09-02 NOTE — Progress Notes (Signed)
Chief Complaint:   OBESITY Clayton [name] is here to discuss his progress with his obesity treatment plan along with follow-up of his obesity related diagnoses. Clayton [name] is on following a lower carbohydrate, vegetable and lean protein rich diet plan and states he is following his eating plan approximately 50% of the time. Bly states he is doing 0 minutes 0 times per week.  Today's visit was #: 28 Starting weight: 266 lbs Starting date: 11/01/2017 Today's weight: 251 lbs Today's date: 09/01/2020 Total lbs lost to date: 15 Total lbs lost since last in-office visit: 0  Interim History: Clayton [name] is 2 weeks status post hip replacement. He is doing physical therapy regularly and is recovering better than expected. He is ready to get back to his Low carbohydrate plan more strictly and he wants to lose another 30 lbs.  Subjective:   1. Stress Clayton [name] has had a lot of stress with his recent health issues including an upcoming cardio ablation and recent surgery. He is ready to work on stress reduction techniques and he wonders how to start.  Assessment/Plan:   1. Stress We will refer Clayton [name] to Dr. Mallie Mussel, our Bariatric Psychologist for evaluation and recommendations.  2. Obesity with current BMI 31.4 Clayton [name] is currently in the action stage of change. As such, his goal is to continue with weight loss efforts. He has agreed to following a lower carbohydrate, vegetable and lean protein rich diet plan with increased vegetables.   Exercise goals: Continue physical therapy for exercise.  Behavioral modification strategies: increasing lean protein intake, increasing vegetables, and increasing water intake.  Clayton [name] has agreed to follow-up with our clinic in 3 to 4 weeks. He was informed of the importance of frequent follow-up visits to maximize his success with intensive lifestyle modifications for his multiple health conditions.   Objective:   Blood pressure 124/75, pulse 82, temperature 97.7 F  (36.5 C), height '6\' 3"'$  (1.905 m), weight 251 lb (113.9 kg), SpO2 98 %. Body mass index is 31.37 kg/m.  General: Cooperative, alert, well developed, in no acute distress. HEENT: Conjunctivae and lids unremarkable. Cardiovascular: Regular rhythm.  Lungs: Normal work of breathing. Neurologic: No focal deficits.   Lab Results  Component Value Date   CREATININE 1.45 (H) 08/13/2020   BUN 26 (H) 08/13/2020   NA 137 08/13/2020   K 4.3 08/13/2020   CL 107 08/13/2020   CO2 23 08/13/2020   Lab Results  Component Value Date   ALT 27 08/12/2020   AST 23 08/12/2020   ALKPHOS 44 08/12/2020   BILITOT 0.8 08/12/2020   Lab Results  Component Value Date   HGBA1C 5.8 (H) 07/04/2020   HGBA1C 5.8 (H) 08/01/2019   HGBA1C 5.8 (H) 09/04/2018   HGBA1C 6.0 (H) 11/01/2017   HGBA1C 5.4 04/14/2016   Lab Results  Component Value Date   INSULIN 3.9 08/01/2019   INSULIN 9.0 09/04/2018   INSULIN 22.4 11/01/2017   Lab Results  Component Value Date   TSH 2.140 08/01/2020   Lab Results  Component Value Date   CHOL 120 08/01/2020   HDL 54 08/01/2020   LDLCALC 50 08/01/2020   TRIG 81 08/01/2020   CHOLHDL 2.2 08/01/2020   Lab Results  Component Value Date   VD25OH 39.95 01/24/2020   VD25OH 37.6 08/01/2019   VD25OH 43.2 09/04/2018   Lab Results  Component Value Date   WBC 9.9 08/13/2020   HGB 12.7 (L) 08/13/2020   HCT 37.5 (L) 08/13/2020   MCV 89.9 08/13/2020  PLT 179 08/13/2020   Lab Results  Component Value Date   IRON 71 01/24/2020   FERRITIN 77.1 01/24/2020    Attestation Statements:   Reviewed by clinician on day of visit: allergies, medications, problem list, medical history, surgical history, family history, social history, and previous encounter notes.  Time spent on visit including pre-visit chart review and post-visit care and charting was 30 minutes.    I, Trixie Dredge, am acting as transcriptionist for Dennard Nip, MD.  I have reviewed the above documentation  for accuracy and completeness, and I agree with the above. -  Dennard Nip, MD

## 2020-09-04 ENCOUNTER — Other Ambulatory Visit: Payer: Self-pay

## 2020-09-04 ENCOUNTER — Encounter: Payer: Self-pay | Admitting: Family Medicine

## 2020-09-04 DIAGNOSIS — F32A Depression, unspecified: Secondary | ICD-10-CM

## 2020-09-05 ENCOUNTER — Telehealth (HOSPITAL_COMMUNITY): Payer: Self-pay | Admitting: Emergency Medicine

## 2020-09-05 NOTE — Telephone Encounter (Signed)
Reaching out to patient to offer assistance regarding upcoming cardiac imaging study; pt verbalizes understanding of appt date/time, parking situation and where to check in, pre-test NPO status and medications ordered, and verified current allergies; name and call back number provided for further questions should they arise Marchia Bond RN Navigator Cardiac Imaging [facility] [phone] office [phone] cell  Denies IV issues Denies allergies Hold spironolactone Denies claustro

## 2020-09-08 ENCOUNTER — Encounter (HOSPITAL_COMMUNITY): Payer: Self-pay

## 2020-09-08 ENCOUNTER — Ambulatory Visit (HOSPITAL_COMMUNITY)
Admission: RE | Admit: 2020-09-08 | Discharge: 2020-09-08 | Disposition: A | Discharge status: Home / Self Care | Payer: Medicare PPO | Source: Ambulatory Visit | Attending: Cardiology | Admitting: Cardiology

## 2020-09-08 ENCOUNTER — Other Ambulatory Visit: Payer: Self-pay

## 2020-09-08 DIAGNOSIS — I4819 Other persistent atrial fibrillation: Secondary | ICD-10-CM | POA: Diagnosis not present

## 2020-09-08 MED ORDER — IOHEXOL 300 MG/ML  SOLN
80.0000 mL | Freq: Once | INTRAMUSCULAR | Status: AC | PRN
  Administered 2020-09-08: 80 mL via INTRAVENOUS

## 2020-09-09 NOTE — Pre-Procedure Instructions (Signed)
Instructed patient on the following items: Arrival time 0630 Nothing to eat or drink after midnight No meds AM of procedure Responsible person to drive you home and stay with you for 24 hrs  Have you missed any doses of anti-coagulant Eliquis- hasn't missed any doses.   

## 2020-09-09 NOTE — Anesthesia Preprocedure Evaluation (Addendum)
Anesthesia Evaluation  Patient identified by MRN, date of birth, ID band Patient awake    Reviewed: Allergy & Precautions, NPO status , Patient's Chart, lab work & pertinent test results  Airway Mallampati: II  TM Distance: >3 FB Neck ROM: Full    Dental no notable dental hx. (+) Teeth Intact, Dental Advisory Given   Pulmonary sleep apnea and Continuous Positive Airway Pressure Ventilation ,    Pulmonary exam normal breath sounds clear to auscultation       Cardiovascular hypertension, Normal cardiovascular exam+ dysrhythmias Atrial Fibrillation  Rhythm:Regular Rate:Normal     Neuro/Psych negative neurological ROS     GI/Hepatic negative GI ROS, Neg liver ROS,   Endo/Other  negative endocrine ROS  Renal/GU negative Renal ROS     Musculoskeletal  (+) Arthritis ,   Abdominal (+) + obese,   Peds  Hematology Lab Results      Component                Value               Date                      WBC                      9.9                 08/13/2020                HGB                      12.7 (L)            08/13/2020                HCT                      37.5 (L)            08/13/2020                MCV                      89.9                08/13/2020                PLT                      179                 08/13/2020              Anesthesia Other Findings   Reproductive/Obstetrics                            Anesthesia Physical Anesthesia Plan  ASA: 3  Anesthesia Plan: General   Post-op Pain Management:    Induction: Intravenous  PONV Risk Score and Plan: 3 and Treatment may vary due to age or medical condition, Midazolam and Ondansetron  Airway Management Planned: Oral ETT  Additional Equipment: None  Intra-op Plan:   Post-operative Plan: Extubation in OR  Informed Consent: I have reviewed the patients History and Physical, chart, labs and discussed the procedure  including the risks, benefits and alternatives for the proposed anesthesia with the patient or authorized  representative who has indicated his/her understanding and acceptance.     Dental advisory given  Plan Discussed with: CRNA and Anesthesiologist  Anesthesia Plan Comments:       Anesthesia Quick Evaluation

## 2020-09-10 ENCOUNTER — Encounter (HOSPITAL_COMMUNITY): Payer: Self-pay | Admitting: Cardiology

## 2020-09-10 ENCOUNTER — Ambulatory Visit (HOSPITAL_COMMUNITY): Payer: Medicare PPO | Admitting: Anesthesiology

## 2020-09-10 ENCOUNTER — Ambulatory Visit (HOSPITAL_COMMUNITY)
Admission: RE | Disposition: A | Discharge status: Home / Self Care | Payer: Self-pay | Source: Home / Self Care | Attending: Cardiology

## 2020-09-10 ENCOUNTER — Other Ambulatory Visit: Payer: Self-pay

## 2020-09-10 ENCOUNTER — Ambulatory Visit (HOSPITAL_COMMUNITY)
Admission: RE | Admit: 2020-09-10 | Discharge: 2020-09-10 | Disposition: A | Discharge status: Home / Self Care | Payer: Medicare PPO | Attending: Cardiology | Admitting: Cardiology

## 2020-09-10 DIAGNOSIS — E669 Obesity, unspecified: Secondary | ICD-10-CM | POA: Insufficient documentation

## 2020-09-10 DIAGNOSIS — I4819 Other persistent atrial fibrillation: Secondary | ICD-10-CM | POA: Diagnosis not present

## 2020-09-10 DIAGNOSIS — Z6832 Body mass index (BMI) 32.0-32.9, adult: Secondary | ICD-10-CM | POA: Insufficient documentation

## 2020-09-10 DIAGNOSIS — Z9989 Dependence on other enabling machines and devices: Secondary | ICD-10-CM | POA: Diagnosis not present

## 2020-09-10 DIAGNOSIS — H8102 Meniere's disease, left ear: Secondary | ICD-10-CM | POA: Diagnosis not present

## 2020-09-10 DIAGNOSIS — I10 Essential (primary) hypertension: Secondary | ICD-10-CM | POA: Diagnosis not present

## 2020-09-10 DIAGNOSIS — G4730 Sleep apnea, unspecified: Secondary | ICD-10-CM | POA: Insufficient documentation

## 2020-09-10 DIAGNOSIS — Z8249 Family history of ischemic heart disease and other diseases of the circulatory system: Secondary | ICD-10-CM | POA: Insufficient documentation

## 2020-09-10 DIAGNOSIS — E785 Hyperlipidemia, unspecified: Secondary | ICD-10-CM | POA: Diagnosis not present

## 2020-09-10 DIAGNOSIS — Z96641 Presence of right artificial hip joint: Secondary | ICD-10-CM | POA: Insufficient documentation

## 2020-09-10 DIAGNOSIS — G4733 Obstructive sleep apnea (adult) (pediatric): Secondary | ICD-10-CM | POA: Diagnosis not present

## 2020-09-10 DIAGNOSIS — Z8349 Family history of other endocrine, nutritional and metabolic diseases: Secondary | ICD-10-CM | POA: Insufficient documentation

## 2020-09-10 HISTORY — PX: ATRIAL FIBRILLATION ABLATION: EP1191

## 2020-09-10 LAB — GLUCOSE, CAPILLARY: Glucose-Capillary: 122 mg/dL — ABNORMAL HIGH (ref 70–99)

## 2020-09-10 LAB — POCT ACTIVATED CLOTTING TIME
Activated Clotting Time: 277 seconds
Activated Clotting Time: 323 seconds

## 2020-09-10 SURGERY — ATRIAL FIBRILLATION ABLATION
Anesthesia: General

## 2020-09-10 MED ORDER — HEPARIN (PORCINE) IN NACL 1000-0.9 UT/500ML-% IV SOLN
INTRAVENOUS | Status: AC
  Filled 2020-09-10: qty 500

## 2020-09-10 MED ORDER — DOBUTAMINE IN D5W 4-5 MG/ML-% IV SOLN
INTRAVENOUS | Status: DC | PRN
  Administered 2020-09-10: 20 ug/kg/min via INTRAVENOUS

## 2020-09-10 MED ORDER — HEPARIN (PORCINE) IN NACL 1000-0.9 UT/500ML-% IV SOLN
INTRAVENOUS | Status: DC | PRN
  Administered 2020-09-10 (×5): 500 mL

## 2020-09-10 MED ORDER — SODIUM CHLORIDE 0.9% FLUSH: 3.0000 mL | INTRAVENOUS | Status: DC | PRN

## 2020-09-10 MED ORDER — ONDANSETRON HCL 4 MG/2ML IJ SOLN
INTRAMUSCULAR | Status: DC | PRN
  Administered 2020-09-10: 4 mg via INTRAVENOUS

## 2020-09-10 MED ORDER — SODIUM CHLORIDE 0.9 % IV SOLN: INTRAVENOUS | Status: DC

## 2020-09-10 MED ORDER — HEPARIN SODIUM (PORCINE) 1000 UNIT/ML IJ SOLN
INTRAMUSCULAR | Status: DC | PRN
  Administered 2020-09-10: 1000 [IU] via INTRAVENOUS

## 2020-09-10 MED ORDER — SUGAMMADEX SODIUM 200 MG/2ML IV SOLN
INTRAVENOUS | Status: DC | PRN
  Administered 2020-09-10: 200 mg via INTRAVENOUS

## 2020-09-10 MED ORDER — PROTAMINE SULFATE 10 MG/ML IV SOLN
INTRAVENOUS | Status: DC | PRN
  Administered 2020-09-10: 40 mg via INTRAVENOUS

## 2020-09-10 MED ORDER — DOBUTAMINE INFUSION FOR EP/ECHO/NUC (1000 MCG/ML)
INTRAVENOUS | Status: AC
  Filled 2020-09-10: qty 250

## 2020-09-10 MED ORDER — CEFAZOLIN SODIUM-DEXTROSE 2-4 GM/100ML-% IV SOLN
INTRAVENOUS | Status: AC
  Filled 2020-09-10: qty 100

## 2020-09-10 MED ORDER — CEFAZOLIN SODIUM-DEXTROSE 2-3 GM-%(50ML) IV SOLR
INTRAVENOUS | Status: DC | PRN
Start: 2020-09-10 — End: 2020-09-10
  Administered 2020-09-10: 2 g via INTRAVENOUS

## 2020-09-10 MED ORDER — FENTANYL CITRATE (PF) 250 MCG/5ML IJ SOLN
INTRAMUSCULAR | Status: DC | PRN
  Administered 2020-09-10 (×2): 50 ug via INTRAVENOUS

## 2020-09-10 MED ORDER — ONDANSETRON HCL 4 MG/2ML IJ SOLN: 4.0000 mg | Freq: Four times a day (QID) | INTRAMUSCULAR | Status: DC | PRN

## 2020-09-10 MED ORDER — MIDAZOLAM HCL 2 MG/2ML IJ SOLN
INTRAMUSCULAR | Status: DC | PRN
  Administered 2020-09-10: 2 mg via INTRAVENOUS

## 2020-09-10 MED ORDER — HEPARIN SODIUM (PORCINE) 1000 UNIT/ML IJ SOLN
INTRAMUSCULAR | Status: AC
  Filled 2020-09-10: qty 1

## 2020-09-10 MED ORDER — SODIUM CHLORIDE 0.9% FLUSH: 3.0000 mL | Freq: Two times a day (BID) | INTRAVENOUS | Status: DC

## 2020-09-10 MED ORDER — SODIUM CHLORIDE 0.9 % IV SOLN: 250.0000 mL | INTRAVENOUS | Status: DC | PRN

## 2020-09-10 MED ORDER — ACETAMINOPHEN 325 MG PO TABS: 650.0000 mg | ORAL_TABLET | ORAL | Status: DC | PRN

## 2020-09-10 MED ORDER — PROPOFOL 10 MG/ML IV BOLUS
INTRAVENOUS | Status: DC | PRN
  Administered 2020-09-10: 60 mg via INTRAVENOUS
  Administered 2020-09-10: 100 mg via INTRAVENOUS

## 2020-09-10 MED ORDER — PHENYLEPHRINE HCL-NACL 20-0.9 MG/250ML-% IV SOLN
INTRAVENOUS | Status: DC | PRN
  Administered 2020-09-10: 20 ug/min via INTRAVENOUS

## 2020-09-10 MED ORDER — LIDOCAINE 2% (20 MG/ML) 5 ML SYRINGE
INTRAMUSCULAR | Status: DC | PRN
  Administered 2020-09-10: 80 mg via INTRAVENOUS

## 2020-09-10 MED ORDER — ROCURONIUM BROMIDE 10 MG/ML (PF) SYRINGE
PREFILLED_SYRINGE | INTRAVENOUS | Status: DC | PRN
  Administered 2020-09-10: 70 mg via INTRAVENOUS

## 2020-09-10 MED ORDER — HEPARIN SODIUM (PORCINE) 1000 UNIT/ML IJ SOLN
INTRAMUSCULAR | Status: DC | PRN
  Administered 2020-09-10: 2000 [IU] via INTRAVENOUS
  Administered 2020-09-10: 4000 [IU] via INTRAVENOUS
  Administered 2020-09-10: 3000 [IU] via INTRAVENOUS
  Administered 2020-09-10: 5000 [IU] via INTRAVENOUS
  Administered 2020-09-10: 15000 [IU] via INTRAVENOUS

## 2020-09-10 SURGICAL SUPPLY — 19 items
BAG SNAP BAND KOVER 36X36 (MISCELLANEOUS) ×2 IMPLANT
CATH OCTARAY 2.0 F 3-3-3-3-3 (CATHETERS) ×2 IMPLANT
CATH S CIRCA THERM PROBE 10F (CATHETERS) ×2 IMPLANT
CATH SMTCH THERMOCOOL SF DF (CATHETERS) ×4 IMPLANT
CATH SOUNDSTAR ECO 8FR (CATHETERS) ×2 IMPLANT
CATH WEB BI DIR CSDF CRV REPRO (CATHETERS) ×2 IMPLANT
CLOSURE PERCLOSE PROSTYLE (VASCULAR PRODUCTS) ×8 IMPLANT
COVER SWIFTLINK CONNECTOR (BAG) ×2 IMPLANT
KIT VERSACROSS STEERABLE D1 (CATHETERS) ×2 IMPLANT
PACK EP LATEX FREE (CUSTOM PROCEDURE TRAY) ×2
PACK EP LF (CUSTOM PROCEDURE TRAY) ×1 IMPLANT
PAD PRO RADIOLUCENT 2001M-C (PAD) ×2 IMPLANT
PATCH CARTO3 (PAD) ×2 IMPLANT
SHEATH CARTO VIZIGO SM CVD (SHEATH) ×2 IMPLANT
SHEATH PINNACLE 7F 10CM (SHEATH) ×2 IMPLANT
SHEATH PINNACLE 8F 10CM (SHEATH) ×4 IMPLANT
SHEATH PINNACLE 9F 10CM (SHEATH) ×2 IMPLANT
SHEATH PROBE COVER 6X72 (BAG) ×2 IMPLANT
TUBING SMART ABLATE COOLFLOW (TUBING) ×2 IMPLANT

## 2020-09-10 NOTE — Anesthesia Procedure Notes (Signed)
Procedure Name: Intubation Date/Time: 09/10/2020 8:36 AM Performed by: Betha Loa, CRNA Pre-anesthesia Checklist: Patient identified, Emergency Drugs available, Suction available and Patient being monitored Patient Re-evaluated:Patient Re-evaluated prior to induction Oxygen Delivery Method: Circle System Utilized Preoxygenation: Pre-oxygenation with 100% oxygen Induction Type: IV induction Ventilation: Mask ventilation without difficulty and Oral airway inserted - appropriate to patient size Laryngoscope Size: Mac and 4 Grade View: Grade II Tube type: Oral Tube size: 7.5 mm Number of attempts: 1 Airway Equipment and Method: Stylet and Oral airway Placement Confirmation: ETT inserted through vocal cords under direct vision, positive ETCO2 and breath sounds checked- equal and bilateral Secured at: 21 cm Tube secured with: Tape Dental Injury: Teeth and Oropharynx as per pre-operative assessment

## 2020-09-10 NOTE — Discharge Instructions (Addendum)
Post procedure care instructions No driving for 4 days. No lifting over 5 lbs for 1 week. No vigorous or sexual activity for 1 week. You may return to work/your usual activities on 09/18/20. Keep procedure site clean & dry. If you notice increased pain, swelling, bleeding or pus, call/return!  You may shower after 24 hours, but no soaking in baths/hot tubs/pools for 1 week.    You have an appointment set up with the [facility].  Multiple studies have shown that being followed by a dedicated atrial fibrillation clinic in addition to the standard care you receive from your other physicians improves health. We believe that enrollment in the atrial fibrillation clinic will allow [name] to better care for you.   The phone number to the [facility] is [phone]. The clinic is staffed Monday through Friday from 8:30am to 5pm.  Parking Directions: The clinic is located in the Heart and Vascular Building connected to [facility]. 1)From [address] turn on to [address] and go to the 3rd entrance  (Heart and Vascular entrance) on the right. 2)Look to the right for Heart &Vascular Parking Garage. 3)A code for the entrance is required, for Sept is 4455.   4)Take the elevators to the 1st floor. Registration is in the room with the glass walls at the end of the hallway.  Cardiac Ablation, Care After  This sheet gives you information about how to care for yourself after your procedure. Your health care provider may also give you more specific instructions. If you have problems or questions, contact your health care provider. What can I expect after the procedure? After the procedure, it is common to have: Bruising around your puncture site. Tenderness around your puncture site. Skipped heartbeats. Tiredness (fatigue).  Follow these instructions at home: Puncture site care  Follow instructions from your health care provider about how to take care of your  puncture site. Make sure you: If present, leave stitches (sutures), skin glue, or adhesive strips in place. These skin closures may need to stay in place for up to 2 weeks. If adhesive strip edges start to loosen and curl up, you may trim the loose edges. Do not remove adhesive strips completely unless your health care provider tells you to do that. If a large square bandage is present, this may be removed 24 hours after surgery.  Check your puncture site every day for signs of infection. Check for: Redness, swelling, or pain. Fluid or blood. If your puncture site starts to bleed, lie down on your back, apply firm pressure to the area, and contact your health care provider. Warmth. Pus or a bad smell. Driving Do not drive for at least 4 days after your procedure or however long your health care provider recommends. (Do not resume driving if you have previously been instructed not to drive for other health reasons.) Do not drive or use heavy machinery while taking prescription pain medicine. Activity Avoid activities that take a lot of effort for at least 7 days after your procedure. Do not lift anything that is heavier than 5 lb (4.5 kg) for one week.  No sexual activity for 1 week.  Return to your normal activities as told by your health care provider. Ask your health care provider what activities are safe for you. General instructions Take over-the-counter and prescription medicines only as told by your health care provider. Do not use any products that contain nicotine or tobacco, such as cigarettes and e-cigarettes. If you  need help quitting, ask your health care provider. You may shower after 24 hours, but Do not take baths, swim, or use a hot tub for 1 week.  Do not drink alcohol for 24 hours after your procedure. Keep all follow-up visits as told by your health care provider. This is important. Contact a health care provider if: You have redness, mild swelling, or pain around your  puncture site. You have fluid or blood coming from your puncture site that stops after applying firm pressure to the area. Your puncture site feels warm to the touch. You have pus or a bad smell coming from your puncture site. You have a fever. You have chest pain or discomfort that spreads to your neck, jaw, or arm. You are sweating a lot. You feel nauseous. You have a fast or irregular heartbeat. You have shortness of breath. You are dizzy or light-headed and feel the need to lie down. You have pain or numbness in the arm or leg closest to your puncture site. Get help right away if: Your puncture site suddenly swells. Your puncture site is bleeding and the bleeding does not stop after applying firm pressure to the area. These symptoms may represent a serious problem that is an emergency. Do not wait to see if the symptoms will go away. Get medical help right away. Call your local emergency services (911 in the [place] Do not drive yourself to the hospital. Summary After the procedure, it is normal to have bruising and tenderness at the puncture site in your groin, neck, or forearm. Check your puncture site every day for signs of infection. Get help right away if your puncture site is bleeding and the bleeding does not stop after applying firm pressure to the area. This is a medical emergency. This information is not intended to replace advice given to you by your health care provider. Make sure you discuss any questions you have with your health care provider.    If you have any trouble parking or locating the clinic, please don't hesitate to call [phone].

## 2020-09-10 NOTE — Progress Notes (Signed)
Right groin has red marks.

## 2020-09-10 NOTE — Transfer of Care (Signed)
Immediate Anesthesia Transfer of Care Note  Patient: WARNER BARLING  Procedure(s) Performed: ATRIAL FIBRILLATION ABLATION  Patient Location: PACU and Cath Lab  Anesthesia Type:General  Level of Consciousness: awake, alert , oriented and patient cooperative  Airway & Oxygen Therapy: Patient Spontanous Breathing and Patient connected to nasal cannula oxygen  Post-op Assessment: Report given to RN and Post -op Vital signs reviewed and stable  Post vital signs: Reviewed and stable  Last Vitals:  Vitals Value Taken Time  BP 114/61 09/10/20 1140  Temp    Pulse 53 09/10/20 1142  Resp 17 09/10/20 1142  SpO2 96 % 09/10/20 1142  Vitals shown include unvalidated device data.  Last Pain:  Vitals:   09/10/20 0717  TempSrc:   PainSc: 0-No pain      Patients Stated Pain Goal: 3 ([id number])  Complications: There were no known notable events for this encounter.

## 2020-09-10 NOTE — H&P (Signed)
Electrophysiology Office Note   Date:  09/10/2020   ID:  Kolben, Defreese 11-05-1945, MRN [id number]  PCP:  Ma Hillock, DO  Cardiologist:  Claiborne Billings Primary Electrophysiologist:  Viviana Trimble Meredith Leeds, MD    Chief Complaint: AF   History of Present Illness: Clayton [name] is a 75 y.o. male who is being seen today for the evaluation of AF at the request of No ref. provider found. Presenting today for electrophysiology evaluation.  He has a history of atrial fibrillation, hypertension, sleep apnea.  He is currently on flecainide.  He has had multiple episodes of atrial fibrillation with cardioversions.  He had an episode of disorientation and anxiousness associated with transient aphasia.  This was due to mild cognitive impairment.  Today, denies symptoms of palpitations, chest pain, shortness of breath, orthopnea, PND, lower extremity edema, claudication, dizziness, presyncope, syncope, bleeding, or neurologic sequela. The patient is tolerating medications without difficulties. Plan ablation today.    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 Humphrey Rolls; CPAP titration 11/12/04- Dr. Humphrey Rolls   Past Surgical History:  Procedure Laterality Date   CARDIAC CATHETERIZATION  11/03/2005   CARDIOVERSION N/A 09/26/2018   Procedure: CARDIOVERSION;  Surgeon: Geralynn Rile, MD;  Location: [facility];  Service: Endoscopy;  Laterality: N/A;   CARDIOVERSION N/A 03/13/2019   Procedure: CARDIOVERSION;  Surgeon: Skeet Latch, MD;  Location: [facility]COPY;  Service: Cardiovascular;  Laterality: N/A;    CATARACT EXTRACTION  01/19/2012   KNEE ARTHROSCOPY     left knee- 2002    TOTAL HIP ARTHROPLASTY  06/08/2011   Procedure: TOTAL HIP ARTHROPLASTY ANTERIOR APPROACH;  Surgeon: [name], MD;  Location: [facility];  Service: Orthopedics;  Laterality: Left;   TOTAL HIP ARTHROPLASTY Right 08/12/2020   ANTERIOR APPROACH   TOTAL HIP ARTHROPLASTY Right 08/12/2020   Procedure: TOTAL HIP ARTHROPLASTY ANTERIOR APPROACH;  Surgeon: Paralee Cancel, MD;  Location: [facility];  Service: Orthopedics;  Laterality: Right;     Current Facility-Administered Medications  Medication Dose Route Frequency Provider Last Rate Last Admin   0.9 %  sodium chloride infusion   Intravenous Continuous Constance Haw, MD 50 mL/hr at 09/10/20 0736 New Bag at 09/10/20 0736    Allergies:   Patient has no known allergies.   Social History:  The patient  reports that he has never smoked. He has never used smokeless tobacco. He reports that he does not drink alcohol and does not use drugs.   Family History:  The patient's family history includes Arthritis in his father; Diabetes in his maternal grandfather, mother, and sister; Heart disease in his father, maternal grandfather, and paternal grandfather; Hyperlipidemia in his mother; Hypertension in his maternal grandfather and mother; Obesity in his mother; Stroke in his maternal grandmother and mother.   ROS:  Please see the history of present illness.   Otherwise, review of systems is positive for none.   All other systems are reviewed and negative.   PHYSICAL EXAM: VS:  BP Marland Kitchen)  154/96   Pulse 70   Temp 98.4 F (36.9 C) (Oral)   Resp 18   Ht '6\' 3"'$  (1.905 m)   Wt 116.6 kg   SpO2 100%   BMI 32.12 kg/m  , BMI Body mass index is 32.12 kg/m. GEN: Well nourished, well developed, in no acute distress  HEENT: normal  Neck: no JVD, carotid bruits, or masses Cardiac: RRR; no murmurs, rubs, or gallops,no edema  Respiratory:  clear to auscultation bilaterally, normal work of  breathing GI: soft, nontender, nondistended, + BS MS: no deformity or atrophy  Skin: warm and dry Neuro:  Strength and sensation are intact Psych: euthymic mood, full affect   Recent Labs: 08/01/2020: TSH 2.140 08/12/2020: ALT 27 08/13/2020: BUN 26; Creatinine, Ser 1.45; Hemoglobin 12.7; Platelets 179; Potassium 4.3; Sodium 137    Lipid Panel     Component Value Date/Time   CHOL 120 08/01/2020 1031   TRIG 81 08/01/2020 1031   HDL 54 08/01/2020 1031   CHOLHDL 2.2 08/01/2020 1031   CHOLHDL 3.8 10/12/2016 0902   VLDL 17 04/14/2016 0937   LDLCALC 50 08/01/2020 1031   LDLCALC 119 (H) 10/12/2016 0902     Wt Readings from Last 3 Encounters:  09/10/20 116.6 kg  09/01/20 113.9 kg  08/12/20 116.7 kg      Other studies Reviewed: Additional studies/ records that were reviewed today include: TTE 02/01/2019 Review of the above records today demonstrates:   1. Left ventricular ejection fraction, by visual estimation, is 50 to 55%. The left ventricle has low normal function. There is moderately increased left ventricular hypertrophy.  2. Left ventricular diastolic function could not be evaluated.  3. Mildly dilated left ventricular internal cavity size.  4. The left ventricle has no regional wall motion abnormalities.  5. Global right ventricle has normal systolic function.The right ventricular size is normal. No increase in right ventricular wall thickness.  6. Left atrial size was mildly dilated.  7. Right atrial size was normal.  8. The mitral valve is normal in structure. Trivial mitral valve regurgitation.  9. The tricuspid valve is normal in structure. 10. The aortic valve is tricuspid. Aortic valve regurgitation is not visualized. No evidence of aortic valve sclerosis or stenosis. 11. The pulmonic valve was grossly normal. Pulmonic valve regurgitation is not visualized. 12. Aortic dilatation noted. 13. There is mild dilatation of the ascending aorta measuring 39 mm. 14. The  inferior vena cava is normal in size with greater than 50% respiratory variability, suggesting right atrial pressure of 3 mmHg.   ASSESSMENT AND PLAN:  1.  Persistent atrial fibrillation: Clayton [name] has presented today for surgery, with the diagnosis of atrial fibrillation.  The various methods of treatment have been discussed with the patient and family. After consideration of risks, benefits and other options for treatment, the patient has consented to  Procedure(s): Catheter ablation as a surgical intervention .  Risks include but not limited to complete heart block, stroke, esophageal damage, nerve damage, bleeding, vascular damage, tamponade, perforation, MI, and death. The patient's history has been reviewed, patient examined, no change in status, stable for surgery.  I have reviewed the patient's chart and labs.  Questions were answered to the patient's satisfaction.    Jakelin Taussig Curt Bears, MD 09/10/2020 7:53 AM

## 2020-09-10 NOTE — Progress Notes (Signed)
Discharge instructions reviewed with pt and his wife both voice understanding.

## 2020-09-10 NOTE — Anesthesia Postprocedure Evaluation (Signed)
Anesthesia Post Note  Patient: Clayton [name]  Procedure(s) Performed: ATRIAL FIBRILLATION ABLATION     Patient location during evaluation: PACU Anesthesia Type: General Level of consciousness: awake and alert Pain management: pain level controlled Vital Signs Assessment: post-procedure vital signs reviewed and stable Respiratory status: spontaneous breathing, nonlabored ventilation, respiratory function stable and patient connected to nasal cannula oxygen Cardiovascular status: blood pressure returned to baseline and stable Postop Assessment: no apparent nausea or vomiting Anesthetic complications: no   There were no known notable events for this encounter.  Last Vitals:  Vitals:   09/10/20 1315 09/10/20 1344  BP:  114/65  Pulse: (!) 57 (!) 55  Resp: 17 (!) 22  Temp:    SpO2: 97% 100%    Last Pain:  Vitals:   09/10/20 1330  TempSrc:   PainSc: 0-No pain                 Barnet Glasgow

## 2020-09-11 ENCOUNTER — Encounter (HOSPITAL_COMMUNITY): Payer: Self-pay | Admitting: Cardiology

## 2020-09-11 LAB — POCT ACTIVATED CLOTTING TIME
Activated Clotting Time: 300 seconds
Activated Clotting Time: 317 seconds

## 2020-09-11 MED FILL — Cefazolin Sodium-Dextrose IV Solution 2 GM/100ML-4%: INTRAVENOUS | Qty: 100 | Status: AC

## 2020-09-14 NOTE — Progress Notes (Signed)
Cardiology Office Note   Date:  09/16/2020   ID:  Ralston, Kellas [birth year], MRN [id number]  PCP:  Ma Hillock, DO  Cardiologist:  Shelva Majestic, MD EP: Will Meredith Leeds, MD  Chief Complaint  Patient presents with   Follow-up    S/p recent afib ablation      History of Present Illness: CASHE SOLAN is a 75 y.o. male with a PMH of paroxysmal atrial fibrillation s/p recent ablation 09/10/20, HTN, HLD, OSA on CPAP, and Meniere's disease who presents for post-Afib ablation follow-up.   He has a longstanding history of paroxysmal atrial fibrillation dating back to 2007 and has undergone several cardioversions. He follows with Dr. Curt Bears with EP. He had been on flecainide in the past for rhythm control, however felt fatigued with this medication which was ultimately discontinued in the setting of recurrent atrial fibrillation 06/2020 and he was transitioned to amiodarone. He was initially recommended to undergo repeat DCCV 06/2020, however converted to sinus rhythm prior to his procedure. He was scheduled for right hip replacement 07/2020 which occurred without complications. He ultimately underwent a successful Afib ablation with Dr. Curt Bears. His last ischemic evaluation was a NST 06/2019 to evaluate DOE which showed possible prior MI and mild peri-infarct ischemia which was felt to be possible diaphragmatic attenuation on Dr. Evette Georges review; overall low risk study. He had a CT Chest/calcium score 08/2020 per Dr. Curt Bears which showed no evidence of LAA thrombus and score of 42 placing him in the 24th percentile for age/race/sex. His last echocardiogram 01/2019 showed EF 50-55%, moderate LVH, indeterminate LV diastolic function, mild LV dilation, mild LAE, and no significant valvular dysfunction.   He presents today for follow-up after recent afib ablation. He starts our visit today with a recap of his cardiac history dating back to 2007. He gives insight into events leading up to his  first episode of atrial fibrillation, experiencing significant personal stress with the loss of his first wife. He describes his fortune at meeting his second wife, Opal Sidles, while on a sojourn to spread his first wifes ashes. He then describes a pattern of atrial fibrillation episodes which seem to be triggered by medical issues associated with his significant other. He tells me about the prior cardioversions and antiarrhythmic therapies used in the past to manage his paroxysmal afib, notably flecainide and amiodarone which were both poorly tolerated due to side effects. He has been working with Dr. Curt Bears (EP) and ultimately underwent successful Afib ablation 09/10/20. He has done well following his ablation without complaints of chest pain, SOB, or palpitations. He reports ongoing issues with his big 3 symptoms which he calls the "NFL" - nausea, fatigue, and lightheadedness, though he reports are overall improved. His blood pressure is better controlled at home. He has been compliant with this eliquis. He then describes his "Plan B" which he states is an 11 point plan for helping him live a meaningful/functional life for the next 10+ years which he provides in printed form today. This list includes a plan to continue working with Dr. Leafy Ro with the [facility] program for ongoing weight management, ongoing follow-up with his cardiology teams (Dr. Claiborne Billings, Dr. Curt Bears, and the Afib clinic), increasing his physical activity, working with his sports medicine doctor (Dr. Hulan Saas) to develop an exercise regimen, continuing to follow with Dr. Elwyn Reach for his Meniere's syndrome, ongoing eye care with routine exams, ongoing dental care with routine exams, continued close monitoring by his PCP  for management of pre-diabetes, urologic follow-up with Dr. Louis Meckel for prostate issues, dermatology follow-up with Dr. [name], and finally establishing care with Dr. Michail Sermon with psychology to develop better  coping strategies to manage his stress. He is grateful for and confident in his team of providers to help him reach his 10 year goal.     Past Medical History:  Diagnosis Date   Arthritis    hips    Atrial fibrillation (HCC)    Cataract    Cochlear Meniere syndrome of left ear 06/09/2015   CPAP (continuous positive airway pressure) dependence    Dysrhythmia    PAF- hx of 7 years ago    HLD (hyperlipidemia)    Hypertension 05/29/2010   ECHO-EF 67% wnl   Meniere disease    Obesity    Palpitations    Sleep apnea 09/14/2004   [facility]- Dr. Humphrey Rolls; CPAP titration 11/12/04- Dr. Humphrey Rolls    Past Surgical History:  Procedure Laterality Date   ATRIAL FIBRILLATION ABLATION N/A 09/10/2020   Procedure: ATRIAL FIBRILLATION ABLATION;  Surgeon: Constance Haw, MD;  Location: [facility];  Service: Cardiovascular;  Laterality: N/A;   CARDIAC CATHETERIZATION  11/03/2005   CARDIOVERSION N/A 09/26/2018   Procedure: CARDIOVERSION;  Surgeon: Geralynn Rile, MD;  Location: [place];  Service: Endoscopy;  Laterality: N/A;   CARDIOVERSION N/A 03/13/2019   Procedure: CARDIOVERSION;  Surgeon: Skeet Latch, MD;  Location: [facility];  Service: Cardiovascular;  Laterality: N/A;   CATARACT EXTRACTION  01/19/2012   KNEE ARTHROSCOPY     left knee- 2002    TOTAL HIP ARTHROPLASTY  06/08/2011   Procedure: TOTAL HIP ARTHROPLASTY ANTERIOR APPROACH;  Surgeon: [name], MD;  Location: [facility];  Service: Orthopedics;  Laterality: Left;   TOTAL HIP ARTHROPLASTY Right 08/12/2020   ANTERIOR APPROACH   TOTAL HIP ARTHROPLASTY Right 08/12/2020   Procedure: TOTAL HIP ARTHROPLASTY ANTERIOR APPROACH;  Surgeon: Paralee Cancel, MD;  Location: [facility];  Service: Orthopedics;  Laterality: Right;     Current Outpatient Medications  Medication Sig Dispense Refill   amLODipine (NORVASC) 5 MG tablet TAKE 1.5 TABLETS BY MOUTH DAILY. (Patient taking differently: Take 5 mg by mouth  daily.) 135 tablet 2   Ascorbic Acid (VITAMIN C) 1000 MG tablet Take 1,000 mg by mouth in the morning.     Coenzyme Q10 200 MG capsule Take 200 mg by mouth in the morning.     docusate sodium (COLACE) 100 MG capsule Take 1 capsule (100 mg total) by mouth 2 (two) times daily. 10 capsule 0   ELIQUIS 5 MG TABS tablet TAKE 1 TABLET BY MOUTH TWICE A DAY (Patient taking differently: Take 5 mg by mouth 2 (two) times daily.) 180 tablet 1   ezetimibe (ZETIA) 10 MG tablet Take 1 tablet (10 mg total) by mouth in the morning. 90 tablet 3   gabapentin (NEURONTIN) 100 MG capsule Take 2 capsules (200 mg total) by mouth at bedtime. 180 capsule 3   HYDROcodone-acetaminophen (NORCO/VICODIN) 5-325 MG tablet Take 1-2 tablets by mouth every 6 (six) hours as needed for severe pain. 42 tablet 0   metFORMIN (GLUCOPHAGE) 500 MG tablet Take 1 tablet (500 mg total) by mouth daily with breakfast. 90 tablet 1   methocarbamol (ROBAXIN) 500 MG tablet Take 1 tablet (500 mg total) by mouth every 6 (six) hours as needed for muscle spasms. 40 tablet 0   metoprolol succinate (TOPROL-XL) 50 MG 24 hr tablet Take 1 tablet (50 mg  total) by mouth daily. Take with or immediately following a meal. (Patient taking differently: Take 50 mg by mouth 2 (two) times daily. Take with or immediately following a meal.) 30 tablet 6   metoprolol succinate (TOPROL-XL) 50 MG 24 hr tablet Take 50 mg by mouth daily. Take with or immediately following a meal.     olmesartan (BENICAR) 40 MG tablet TAKE 1 TABLET BY MOUTH EVERY DAY (Patient taking differently: Take 40 mg by mouth in the morning.) 90 tablet 3   Pitavastatin Calcium (LIVALO) 2 MG TABS Take 1 tablet (2 mg total) by mouth every evening. 90 tablet 3   polyethylene glycol (MIRALAX / GLYCOLAX) 17 g packet Take 17 g by mouth daily as needed for mild constipation. 14 each 0   spironolactone (ALDACTONE) 25 MG tablet TAKE 1/2 TABLET BY MOUTH EVERY DAY 45 tablet 3   tamsulosin (FLOMAX) 0.4 MG CAPS capsule  Take 0.4 mg by mouth in the morning.     triamcinolone cream (KENALOG) 0.5 % APPLY 1 APPLICATION TOPICALLY 2 (TWO) TIMES DAILY. TO AFFECTED AREAS. 30 g 3   zinc gluconate 50 MG tablet Take 50 mg by mouth in the morning.     No current facility-administered medications for this visit.    Allergies:   Patient has no known allergies.    Social History:  The patient  reports that he has never smoked. He has never used smokeless tobacco. He reports that he does not drink alcohol and does not use drugs.   Family History:  The patient's family history includes Arthritis in his father; Diabetes in his maternal grandfather, mother, and sister; Heart disease in his father, maternal grandfather, and paternal grandfather; Hyperlipidemia in his mother; Hypertension in his maternal grandfather and mother; Obesity in his mother; Stroke in his maternal grandmother and mother.    ROS:  Please see the history of present illness.   Otherwise, review of systems are positive for none.   All other systems are reviewed and negative.    PHYSICAL EXAM: VS:  BP (!) 142/98   Pulse 79   Ht '6\' 3"'$  (1.905 m)   Wt 260 lb 9.6 oz (118.2 kg)   SpO2 95%   BMI 32.57 kg/m  , BMI Body mass index is 32.57 kg/m. GEN: Well nourished, well developed, in no acute distress HEENT: sclera anicteric Neck: no JVD, carotid bruits, or masses Cardiac: RRR; no murmurs, rubs, or gallops,no edema  Respiratory:  clear to auscultation bilaterally, normal work of breathing GI: soft, nontender, nondistended, + BS MS: no deformity or atrophy Skin: warm and dry, no rash Neuro:  Strength and sensation are intact Psych: euthymic mood, full affect   EKG:  EKG is ordered today. The ekg ordered today demonstrates sinus rhythm. Rate 79 bpm, chronic RBBB, no STE/D.    Recent Labs: 08/01/2020: TSH 2.140 08/12/2020: ALT 27 08/13/2020: BUN 26; Creatinine, Ser 1.45; Hemoglobin 12.7; Platelets 179; Potassium 4.3; Sodium 137    Lipid Panel     Component Value Date/Time   CHOL 120 08/01/2020 1031   TRIG 81 08/01/2020 1031   HDL 54 08/01/2020 1031   CHOLHDL 2.2 08/01/2020 1031   CHOLHDL 3.8 10/12/2016 0902   VLDL 17 04/14/2016 0937   LDLCALC 50 08/01/2020 1031   LDLCALC 119 (H) 10/12/2016 0902      Wt Readings from Last 3 Encounters:  09/16/20 260 lb 9.6 oz (118.2 kg)  09/10/20 257 lb (116.6 kg)  09/01/20 251 lb (113.9 kg)  Other studies Reviewed: Additional studies/ records that were reviewed today include:   Echocardiogram 01/2019:  1. Left ventricular ejection fraction, by visual estimation, is 50 to  55%. The left ventricle has low normal function. There is moderately  increased left ventricular hypertrophy.   2. Left ventricular diastolic function could not be evaluated.   3. Mildly dilated left ventricular internal cavity size.   4. The left ventricle has no regional wall motion abnormalities.   5. Global right ventricle has normal systolic function.The right  ventricular size is normal. No increase in right ventricular wall  thickness.   6. Left atrial size was mildly dilated.   7. Right atrial size was normal.   8. The mitral valve is normal in structure. Trivial mitral valve  regurgitation.   9. The tricuspid valve is normal in structure.  10. The aortic valve is tricuspid. Aortic valve regurgitation is not  visualized. No evidence of aortic valve sclerosis or stenosis.  11. The pulmonic valve was grossly normal. Pulmonic valve regurgitation is  not visualized.  12. Aortic dilatation noted.  13. There is mild dilatation of the ascending aorta measuring 39 mm.  14. The inferior vena cava is normal in size with greater than 50%  respiratory variability, suggesting right atrial pressure of 3 mmHg.   NST 06/2019: The left ventricular ejection fraction is mildly decreased (45-54%). Nuclear stress EF: 47%. Blood pressure demonstrated a hypertensive response to exercise. There was no ST segment  deviation noted during stress. Defect 1: There is a large defect of severe severity present in the basal inferior, mid inferior and apical inferior location. Findings consistent with prior myocardial infarction with peri-infarct ischemia. This is a low risk study.   Abnormal, low risk stress nuclear study with prior inferior infarct and mild peri-infarct ischemia.  Gated ejection fraction 47% with hypokinesis of the inferior basal wall.  Mild left ventricular enlargement.  Ablation 09/10/2020: CONCLUSIONS: 1. Sinus rhythm upon presentation.   2. Successful electrical isolation and anatomical encircling of all four pulmonary veins with radiofrequency current. 3. No inducible arrhythmias following ablation both on and off of dobutamine 4. No early apparent complications.   ASSESSMENT AND PLAN:  1. Paroxysmal atrial fibrillation s/p ablation 09/10/20: patient underwent successful ablation with Dr. Curt Bears last week. Appears he is now off amiodarone. EKG today shows sinus rhythm - Continue eliquis '5mg'$  BID uninterrupted x 3 months - Continue metoprolol for rate control  2. HTN: BP 142/98 today, better controlled at home and hopeful this will improve as he is able to increase his activity and weight loss. - Continue amlodipine, metoprolol succinate, olmesartan, and spironolactone   3. HLD: LDL 50 07/2020  - continue pitavastatin and zetia  4. Pre-DM type 2: A1C 5.8 06/2020 - Continue metformin per PCP  5. OSA on CPAP: compliant - Continue CPAP use  6. CKD stage 3a: Cr 1.45 07/2020 - Continue routine monitoring   Current medicines are reviewed at length with the patient today.  The patient does not have concerns regarding medicines.  The following changes have been made:  As above  Labs/ tests ordered today include:   Orders Placed This Encounter  Procedures   EKG 12-Lead     Disposition:   FU with the Afib clinic as scheduled 09/2020, Dr. Curt Bears as scheduled 11/2020, and Dr.  Claiborne Billings  in 4 months  Signed, Abigail Butts, PA-C  09/16/2020 9:32 PM

## 2020-09-16 ENCOUNTER — Other Ambulatory Visit: Payer: Self-pay

## 2020-09-16 ENCOUNTER — Encounter: Payer: Self-pay | Admitting: Medical

## 2020-09-16 ENCOUNTER — Ambulatory Visit: Payer: Medicare PPO | Admitting: Medical

## 2020-09-16 VITALS — BP 142/98 | HR 79 | Ht 75.0 in | Wt 260.6 lb

## 2020-09-16 DIAGNOSIS — G4733 Obstructive sleep apnea (adult) (pediatric): Secondary | ICD-10-CM

## 2020-09-16 DIAGNOSIS — I10 Essential (primary) hypertension: Secondary | ICD-10-CM | POA: Diagnosis not present

## 2020-09-16 DIAGNOSIS — R7303 Prediabetes: Secondary | ICD-10-CM

## 2020-09-16 DIAGNOSIS — Z9989 Dependence on other enabling machines and devices: Secondary | ICD-10-CM

## 2020-09-16 DIAGNOSIS — E782 Mixed hyperlipidemia: Secondary | ICD-10-CM

## 2020-09-16 DIAGNOSIS — I480 Paroxysmal atrial fibrillation: Secondary | ICD-10-CM | POA: Diagnosis not present

## 2020-09-16 DIAGNOSIS — N1831 Chronic kidney disease, stage 3a: Secondary | ICD-10-CM | POA: Diagnosis not present

## 2020-09-16 NOTE — Patient Instructions (Signed)
Medication Instructions:  No Changes *If you need a refill on your cardiac medications before your next appointment, please call your pharmacy*   Lab Work: No Labs If you have labs (blood work) drawn today and your tests are completely normal, you will receive your results only by: [place] (if you have MyChart) OR A paper copy in the mail If you have any lab test that is abnormal or we need to change your treatment, we will call you to review the results.   Testing/Procedures: No Testing   Follow-Up: At [facility], you and your health needs are our priority.  As part of our continuing mission to provide you with exceptional heart care, we have created designated Provider Care Teams.  These Care Teams include your primary Cardiologist (physician) and Advanced Practice Providers (APPs -  Physician Assistants and Nurse Practitioners) who all work together to provide you with the care you need, when you need it.  We recommend signing up for the patient portal called "MyChart".  Sign up information is provided on this After Visit Summary.  MyChart is used to connect with patients for Virtual Visits (Telemedicine).  Patients are able to view lab/test results, encounter notes, upcoming appointments, etc.  Non-urgent messages can be sent to your provider as well.   To learn more about what you can do with MyChart, go to [email or web address].    Your next appointment:   4 month(s)  The format for your next appointment:   In Person  Provider:   Shelva Majestic, MD   Other Instructions Low-Sodium Eating Plan Sodium, which is an element that makes up salt, helps you maintain a healthy balance of fluids in your body. Too much sodium can increase your bloodpressure and cause fluid and waste to be held in your body. Your health care provider or dietitian may recommend following this plan if you have high blood pressure (hypertension), kidney disease, liver disease, or heart failure.  Eating less sodium can help lower your blood pressure, reduce swelling, and protect your heart, liver, andkidneys. What are tips for following this plan? Reading food labels The Nutrition Facts label lists the amount of sodium in one serving of the food. If you eat more than one serving, you must multiply the listed amount of sodium by the number of servings. Choose foods with less than 140 mg of sodium per serving. Avoid foods with 300 mg of sodium or more per serving. Shopping  Look for lower-sodium products, often labeled as "low-sodium" or "no salt added." Always check the sodium content, even if foods are labeled as "unsalted" or "no salt added." Buy fresh foods. Avoid canned foods and pre-made or frozen meals. Avoid canned, cured, or processed meats. Buy breads that have less than 80 mg of sodium per slice.  Cooking  Eat more home-cooked food and less restaurant, buffet, and fast food. Avoid adding salt when cooking. Use salt-free seasonings or herbs instead of table salt or sea salt. Check with your health care provider or pharmacist before using salt substitutes. Cook with plant-based oils, such as canola, sunflower, or olive oil.  Meal planning When eating at a restaurant, ask that your food be prepared with less salt or no salt, if possible. Avoid dishes labeled as brined, pickled, cured, smoked, or made with soy sauce, miso, or teriyaki sauce. Avoid foods that contain MSG (monosodium glutamate). MSG is sometimes added to [place] food, bouillon, and some canned foods. Make meals that can be grilled, baked, poached, roasted, or  steamed. These are generally made with less sodium. General information Most people on this plan should limit their sodium intake to 1,500-2,000 mg (milligrams) of sodium each day. What foods should I eat? Fruits Fresh, frozen, or canned fruit. Fruit juice. Vegetables Fresh or frozen vegetables. "No salt added" canned vegetables. "No salt added"tomato  sauce and paste. Low-sodium or reduced-sodium tomato and vegetable juice. Grains Low-sodium cereals, including oats, puffed wheat and rice, and shredded wheat. Low-sodium crackers. Unsalted rice. Unsalted pasta. Low-sodium bread.Whole-grain breads and whole-grain pasta. Meats and other proteins Fresh or frozen (no salt added) meat, poultry, seafood, and fish. Low-sodium canned tuna and salmon. Unsalted nuts. Dried peas, beans, and lentils withoutadded salt. Unsalted canned beans. Eggs. Unsalted nut butters. Dairy Milk. Soy milk. Cheese that is naturally low in sodium, such as ricotta cheese, fresh mozzarella, or Swiss cheese. Low-sodium or reduced-sodium cheese. Creamcheese. Yogurt. Seasonings and condiments Fresh and dried herbs and spices. Salt-free seasonings. Low-sodium mustard and ketchup. Sodium-free salad dressing. Sodium-free light mayonnaise. Fresh orrefrigerated horseradish. Lemon juice. Vinegar. Other foods Homemade, reduced-sodium, or low-sodium soups. Unsalted popcorn and pretzels.Low-salt or salt-free chips. The items listed above may not be a complete list of foods and beverages you can eat. Contact a dietitian for more information. What foods should I avoid? Vegetables Sauerkraut, pickled vegetables, and relishes. Olives. [place] fries. Onion rings. Regular canned vegetables (not low-sodium or reduced-sodium). Regular canned tomato sauce and paste (not low-sodium or reduced-sodium). Regular tomato and vegetable juice (not low-sodium or reduced-sodium). Frozenvegetables in sauces. Grains Instant hot cereals. Bread stuffing, pancake, and biscuit mixes. Croutons. Seasoned rice or pasta mixes. Noodle soup cups. Boxed or frozen macaroni andcheese. Regular salted crackers. Self-rising flour. Meats and other proteins Meat or fish that is salted, canned, smoked, spiced, or pickled. Precooked or cured meat, such as sausages or meat loaves. Berniece Salines. Ham. Pepperoni. Hot dogs. Corned beef. Chipped  beef. Salt pork. Jerky. Pickled herring. Anchovies andsardines. Regular canned tuna. Salted nuts. Dairy Processed cheese and cheese spreads. Hard cheeses. Cheese curds. Blue cheese.Feta cheese. String cheese. Regular cottage cheese. Buttermilk. Canned milk. Fats and oils Salted butter. Regular margarine. Ghee. Bacon fat. Seasonings and condiments Onion salt, garlic salt, seasoned salt, table salt, and sea salt. Canned and packaged gravies. Worcestershire sauce. Tartar sauce. Barbecue sauce. Teriyaki sauce. Soy sauce, including reduced-sodium. Steak sauce. Fish sauce. Oyster sauce. Cocktail sauce. Horseradish that you find on the shelf. Regular ketchup and mustard. Meat flavorings and tenderizers. Bouillon cubes. Hot sauce. Pre-made or packaged marinades. Pre-made or packaged taco seasonings. Relishes.Regular salad dressings. Salsa. Other foods Salted popcorn and pretzels. Corn chips and puffs. Potato and tortilla chips.Canned or dried soups. Pizza. Frozen entrees and pot pies. The items listed above may not be a complete list of foods and beverages you should avoid. Contact a dietitian for more information. Summary Eating less sodium can help lower your blood pressure, reduce swelling, and protect your heart, liver, and kidneys. Most people on this plan should limit their sodium intake to 1,500-2,000 mg (milligrams) of sodium each day. Canned, boxed, and frozen foods are high in sodium. Restaurant foods, fast foods, and pizza are also very high in sodium. You also get sodium by adding salt to food. Try to cook at home, eat more fresh fruits and vegetables, and eat less fast food and canned, processed, or prepared foods. This information is not intended to replace advice given to you by your health care provider. Make sure you discuss any questions you have with your healthcare provider.  Document Revised: 02/09/2019 Document Reviewed: 12/06/2018 Elsevier Patient Education  2022 [employer].

## 2020-09-23 ENCOUNTER — Other Ambulatory Visit: Payer: Self-pay

## 2020-09-23 ENCOUNTER — Encounter (INDEPENDENT_AMBULATORY_CARE_PROVIDER_SITE_OTHER): Payer: Self-pay | Admitting: Family Medicine

## 2020-09-23 ENCOUNTER — Ambulatory Visit (INDEPENDENT_AMBULATORY_CARE_PROVIDER_SITE_OTHER): Payer: Medicare PPO | Admitting: Family Medicine

## 2020-09-23 VITALS — BP 125/77 | HR 96 | Temp 98.0°F | Ht 75.0 in | Wt 255.0 lb

## 2020-09-23 DIAGNOSIS — I10 Essential (primary) hypertension: Secondary | ICD-10-CM

## 2020-09-23 DIAGNOSIS — E66812 Obesity, class 2: Secondary | ICD-10-CM

## 2020-09-23 DIAGNOSIS — Z6835 Body mass index (BMI) 35.0-35.9, adult: Secondary | ICD-10-CM | POA: Diagnosis not present

## 2020-09-23 NOTE — Progress Notes (Signed)
Chief Complaint:   OBESITY Clayton [name] is here to discuss his progress with his obesity treatment plan along with follow-up of his obesity related diagnoses. Clayton [name] is on following a lower carbohydrate, vegetable and lean protein rich diet plan and states he is following his eating plan approximately 50% of the time. Clayton [name] states he is doing 0 minutes 0 times per week.  Today's visit was #: 24 Starting weight: 266 lbs Starting date: 11/01/2017 Today's weight: 255 lbs Today's date: 09/23/2020 Total lbs lost to date: 11 Total lbs lost since last in-office visit: 0  Interim History: Clayton [name] has had his right hip replaced approximately 6 weeks ago. He is doing well with walking and driving, and he is getting back to his walking.  Subjective:   1. Primary hypertension Clayton [name]'s blood pressure is well controlled on his medications, but his goal is to decrease his medications especially since he appears to be having side effects.  Assessment/Plan:   1. Primary hypertension Clayton [name] will get back to weight loss efforts and will continue to follow up as directed as he continues his lifestyle modifications.  2. Obesity with current BMI 31.9 Clayton [name] is currently in the action stage of change. As such, his goal is to get back to weightloss efforts . He has agreed to the Category 2 Plan.   Exercise goals: As per Ortho.  Behavioral modification strategies: increasing water intake and meal planning and cooking strategies.  Clayton [name] has agreed to follow-up with our clinic in 4 to 5 weeks. He was informed of the importance of frequent follow-up visits to maximize his success with intensive lifestyle modifications for his multiple health conditions.   Objective:   Blood pressure 125/77, pulse 96, temperature 98 F (36.7 C), height '6\' 3"'$  (1.905 m), weight 255 lb (115.7 kg), SpO2 97 %. Body mass index is 31.87 kg/m.  General: Cooperative, alert, well developed, in no acute distress. HEENT:  Conjunctivae and lids unremarkable. Cardiovascular: Regular rhythm.  Lungs: Normal work of breathing. Neurologic: No focal deficits.   Lab Results  Component Value Date   CREATININE 1.45 (H) 08/13/2020   BUN 26 (H) 08/13/2020   NA 137 08/13/2020   K 4.3 08/13/2020   CL 107 08/13/2020   CO2 23 08/13/2020   Lab Results  Component Value Date   ALT 27 08/12/2020   AST 23 08/12/2020   ALKPHOS 44 08/12/2020   BILITOT 0.8 08/12/2020   Lab Results  Component Value Date   HGBA1C 5.8 (H) 07/04/2020   HGBA1C 5.8 (H) 08/01/2019   HGBA1C 5.8 (H) 09/04/2018   HGBA1C 6.0 (H) 11/01/2017   HGBA1C 5.4 04/14/2016   Lab Results  Component Value Date   INSULIN 3.9 08/01/2019   INSULIN 9.0 09/04/2018   INSULIN 22.4 11/01/2017   Lab Results  Component Value Date   TSH 2.140 08/01/2020   Lab Results  Component Value Date   CHOL 120 08/01/2020   HDL 54 08/01/2020   LDLCALC 50 08/01/2020   TRIG 81 08/01/2020   CHOLHDL 2.2 08/01/2020   Lab Results  Component Value Date   VD25OH 39.95 01/24/2020   VD25OH 37.6 08/01/2019   VD25OH 43.2 09/04/2018   Lab Results  Component Value Date   WBC 9.9 08/13/2020   HGB 12.7 (L) 08/13/2020   HCT 37.5 (L) 08/13/2020   MCV 89.9 08/13/2020   PLT 179 08/13/2020   Lab Results  Component Value Date   IRON 71 01/24/2020   FERRITIN 77.1 01/24/2020  Attestation Statements:   Reviewed by clinician on day of visit: allergies, medications, problem list, medical history, surgical history, family history, social history, and previous encounter notes.  Time spent on visit including pre-visit chart review and post-visit care and charting was 30 minutes.    I, Trixie Dredge, am acting as transcriptionist for Dennard Nip, MD.  I have reviewed the above documentation for accuracy and completeness, and I agree with the above. -  Dennard Nip, MD

## 2020-09-24 ENCOUNTER — Ambulatory Visit (INDEPENDENT_AMBULATORY_CARE_PROVIDER_SITE_OTHER): Payer: Medicare PPO | Admitting: Psychologist

## 2020-09-24 DIAGNOSIS — Z4789 Encounter for other orthopedic aftercare: Secondary | ICD-10-CM | POA: Diagnosis not present

## 2020-09-24 DIAGNOSIS — F411 Generalized anxiety disorder: Secondary | ICD-10-CM

## 2020-09-24 NOTE — Telephone Encounter (Signed)
Pt last seen by Dr. Beasley.  

## 2020-10-08 ENCOUNTER — Ambulatory Visit (INDEPENDENT_AMBULATORY_CARE_PROVIDER_SITE_OTHER): Payer: Medicare PPO | Admitting: Psychologist

## 2020-10-08 ENCOUNTER — Other Ambulatory Visit: Payer: Self-pay

## 2020-10-08 ENCOUNTER — Encounter (HOSPITAL_COMMUNITY): Payer: Self-pay | Admitting: Physician Assistant

## 2020-10-08 ENCOUNTER — Ambulatory Visit (HOSPITAL_COMMUNITY)
Admission: RE | Admit: 2020-10-08 | Discharge: 2020-10-08 | Disposition: A | Discharge status: Home / Self Care | Payer: Medicare PPO | Source: Ambulatory Visit | Attending: Physician Assistant | Admitting: Physician Assistant

## 2020-10-08 VITALS — BP 144/88 | HR 82 | Ht 75.0 in | Wt 269.2 lb

## 2020-10-08 DIAGNOSIS — D6869 Other thrombophilia: Secondary | ICD-10-CM | POA: Insufficient documentation

## 2020-10-08 DIAGNOSIS — H8102 Meniere's disease, left ear: Secondary | ICD-10-CM | POA: Diagnosis not present

## 2020-10-08 DIAGNOSIS — Z7901 Long term (current) use of anticoagulants: Secondary | ICD-10-CM | POA: Insufficient documentation

## 2020-10-08 DIAGNOSIS — Z9989 Dependence on other enabling machines and devices: Secondary | ICD-10-CM | POA: Diagnosis not present

## 2020-10-08 DIAGNOSIS — G4733 Obstructive sleep apnea (adult) (pediatric): Secondary | ICD-10-CM | POA: Diagnosis not present

## 2020-10-08 DIAGNOSIS — Z8249 Family history of ischemic heart disease and other diseases of the circulatory system: Secondary | ICD-10-CM | POA: Diagnosis not present

## 2020-10-08 DIAGNOSIS — F411 Generalized anxiety disorder: Secondary | ICD-10-CM | POA: Diagnosis not present

## 2020-10-08 DIAGNOSIS — Z6833 Body mass index (BMI) 33.0-33.9, adult: Secondary | ICD-10-CM | POA: Insufficient documentation

## 2020-10-08 DIAGNOSIS — Z7182 Exercise counseling: Secondary | ICD-10-CM | POA: Diagnosis not present

## 2020-10-08 DIAGNOSIS — I10 Essential (primary) hypertension: Secondary | ICD-10-CM | POA: Insufficient documentation

## 2020-10-08 DIAGNOSIS — E669 Obesity, unspecified: Secondary | ICD-10-CM | POA: Diagnosis not present

## 2020-10-08 DIAGNOSIS — Z7984 Long term (current) use of oral hypoglycemic drugs: Secondary | ICD-10-CM | POA: Diagnosis not present

## 2020-10-08 DIAGNOSIS — I4819 Other persistent atrial fibrillation: Secondary | ICD-10-CM | POA: Insufficient documentation

## 2020-10-08 DIAGNOSIS — E785 Hyperlipidemia, unspecified: Secondary | ICD-10-CM | POA: Insufficient documentation

## 2020-10-08 DIAGNOSIS — Z79899 Other long term (current) drug therapy: Secondary | ICD-10-CM | POA: Diagnosis not present

## 2020-10-08 NOTE — Progress Notes (Signed)
Primary Care Physician: Ma Hillock, DO Primary Cardiologist: Dr Claiborne Billings Primary Electrophysiologist: Dr Curt Bears Referring Physician: Dr Everette Rank is a 75 y.o. male with a history of HTN, HLD, OSA on CPAP, Meniere's disease, atrial fibrillation who presents for follow up in the [facility]. Patient is on Eliquis for a CHADS2VASC score of 4. Patient was diagnosed with afib remotely, typically associated with stressful life events. He was tried on flecainide and amiodarone and had side effects with both. Flecainide was also ineffective to maintain SR. He underwent afib ablation with Dr Curt Bears on 09/10/20. He states that he is slowly feeling better since the procedure. He was able to play 9 holes of golf yesterday. He denies CP, swallowing pain, or groin issues. He has not had any heart racing or palpitations.   Today, he denies symptoms of palpitations, chest pain, shortness of breath, orthopnea, PND, lower extremity edema, dizziness, presyncope, syncope, bleeding, or neurologic sequela. The patient is tolerating medications without difficulties and is otherwise without complaint today.    Atrial Fibrillation Risk Factors:  he does have symptoms or diagnosis of sleep apnea. he is compliant with CPAP therapy. he does not have a history of rheumatic fever.   he has a BMI of Body mass index is 33.65 kg/m.Marland Kitchen Filed Weights   10/08/20 1339  Weight: 122.1 kg    Family History  Problem Relation Age of Onset   Hypertension Mother    Stroke Mother    Diabetes Mother    Hyperlipidemia Mother    Obesity Mother    Diabetes Sister    Stroke Maternal Grandmother        55 died    Heart disease Maternal Grandfather    Hypertension Maternal Grandfather    Diabetes Maternal Grandfather    Arthritis Father    Heart disease Father    Heart disease Paternal Grandfather      Atrial Fibrillation Management history:  Previous antiarrhythmic  drugs: flecainide, amiodarone  Previous cardioversions: 09/2018, 03/13/19 Previous ablations: 09/10/20 CHADS2VASC score: 4 Anticoagulation history: Eliquis   Past Medical History:  Diagnosis Date   Arthritis    hips    Atrial fibrillation (HCC)    Cataract    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 Humphrey Rolls; CPAP titration 11/12/04- Dr. Humphrey Rolls   Past Surgical History:  Procedure Laterality Date   ATRIAL FIBRILLATION ABLATION N/A 09/10/2020   Procedure: ATRIAL FIBRILLATION ABLATION;  Surgeon: Constance Haw, MD;  Location: [facility];  Service: Cardiovascular;  Laterality: N/A;   CARDIAC CATHETERIZATION  11/03/2005   CARDIOVERSION N/A 09/26/2018   Procedure: CARDIOVERSION;  Surgeon: Geralynn Rile, MD;  Location: [facility];  Service: Endoscopy;  Laterality: N/A;   CARDIOVERSION N/A 03/13/2019   Procedure: CARDIOVERSION;  Surgeon: Skeet Latch, MD;  Location: [facility];  Service: Cardiovascular;  Laterality: N/A;   CATARACT EXTRACTION  01/19/2012   KNEE ARTHROSCOPY     left knee- 2002    TOTAL HIP ARTHROPLASTY  06/08/2011   Procedure: TOTAL HIP ARTHROPLASTY ANTERIOR APPROACH;  Surgeon: [name], MD;  Location: [facility];  Service: Orthopedics;  Laterality: Left;   TOTAL HIP ARTHROPLASTY Right 08/12/2020   ANTERIOR APPROACH   TOTAL HIP ARTHROPLASTY Right 08/12/2020   Procedure: TOTAL HIP ARTHROPLASTY ANTERIOR APPROACH;  Surgeon: Paralee Cancel, MD;  Location: [facility];  Service: Orthopedics;  Laterality: Right;    Current Outpatient Medications  Medication Sig Dispense Refill   amLODipine (NORVASC) 5 MG tablet TAKE 1.5 TABLETS BY MOUTH DAILY. 135 tablet 2   Ascorbic Acid  (VITAMIN C) 1000 MG tablet Take 1,000 mg by mouth in the morning.     Coenzyme Q10 200 MG capsule Take 200 mg by mouth in the morning.     ELIQUIS 5 MG TABS tablet TAKE 1 TABLET BY MOUTH TWICE A DAY 180 tablet 1   ezetimibe (ZETIA) 10 MG tablet Take 1 tablet (10 mg total) by mouth in the morning. 90 tablet 3   metFORMIN (GLUCOPHAGE) 500 MG tablet Take 1 tablet (500 mg total) by mouth daily with breakfast. 90 tablet 1   metoprolol succinate (TOPROL-XL) 50 MG 24 hr tablet Take 1 tablet (50 mg total) by mouth daily. Take with or immediately following a meal. (Patient taking differently: Take 50 mg by mouth 2 (two) times daily. Take with or immediately following a meal.) 30 tablet 6   olmesartan (BENICAR) 40 MG tablet TAKE 1 TABLET BY MOUTH EVERY DAY 90 tablet 3   Pitavastatin Calcium (LIVALO) 2 MG TABS Take 1 tablet (2 mg total) by mouth every evening. 90 tablet 3   spironolactone (ALDACTONE) 25 MG tablet TAKE 1/2 TABLET BY MOUTH EVERY DAY 45 tablet 3   tamsulosin (FLOMAX) 0.4 MG CAPS capsule Take 0.4 mg by mouth in the morning.     No current facility-administered medications for this encounter.    No Known Allergies  Social History   Socioeconomic History   Marital status: Married    Spouse name: Ayman Brull   Number of children: Not on file   Years of education: Not on file   Highest education level: Not on file  Occupational History   Occupation: Retired  Tobacco Use   Smoking status: Never   Smokeless tobacco: Never  Vaping Use   Vaping Use: Never used  Substance and Sexual Activity   Alcohol use: No   Drug use: No   Sexual activity: Yes    Birth control/protection: None  Other Topics Concern   Not on file  Social History Narrative   Married. [name].Takes care of his mother-in-law as well.    Retired, Conservator, museum/gallery.    Drinks caffeine beverages. Takes a daily vitamin.   Exercises routinely.    Smoke detector in the home.    Ambulates independently.    Social  Determinants of Health   Financial Resource Strain: Not on file  Food Insecurity: Not on file  Transportation Needs: Not on file  Physical Activity: Not on file  Stress: Not on file  Social Connections: Not on file  Intimate Partner Violence: Not on file     ROS- All systems are reviewed and negative except as per the HPI above.  Physical Exam: Vitals:   10/08/20 1339  BP: (!) 144/88  Pulse: 82  Weight: 122.1 kg  Height: 6\' 3"  (1.905 m)    GEN- The patient is a well appearing obese elderly male, alert and oriented x 3 today.   Head- normocephalic, atraumatic Eyes-  Sclera clear, conjunctiva pink Ears- hearing intact Oropharynx- clear Neck- supple  Lungs- Clear to ausculation bilaterally, normal work  of breathing Heart- Regular rate and rhythm, no murmurs, rubs or gallops  GI- soft, NT, ND, + BS Extremities- no clubbing, cyanosis, or edema MS- no significant deformity or atrophy Skin- no rash or lesion Psych- euthymic mood, full affect Neuro- strength and sensation are intact  Wt Readings from Last 3 Encounters:  10/08/20 122.1 kg  09/23/20 115.7 kg  09/16/20 118.2 kg    EKG today demonstrates  SR, RBBB, LAFB Vent. rate 82 BPM PR interval 196 ms QRS duration 170 ms QT/QTcB 430/502 ms  Echo 02/01/19 demonstrated   1. Left ventricular ejection fraction, by visual estimation, is 50 to  55%. The left ventricle has low normal function. There is moderately  increased left ventricular hypertrophy.   2. Left ventricular diastolic function could not be evaluated.   3. Mildly dilated left ventricular internal cavity size.   4. The left ventricle has no regional wall motion abnormalities.   5. Global right ventricle has normal systolic function.The right  ventricular size is normal. No increase in right ventricular wall  thickness.   6. Left atrial size was mildly dilated.   7. Right atrial size was normal.   8. The mitral valve is normal in structure. Trivial mitral  valve  regurgitation.   9. The tricuspid valve is normal in structure.  10. The aortic valve is tricuspid. Aortic valve regurgitation is not  visualized. No evidence of aortic valve sclerosis or stenosis.  11. The pulmonic valve was grossly normal. Pulmonic valve regurgitation is not visualized.  12. Aortic dilatation noted.  13. There is mild dilatation of the ascending aorta measuring 39 mm.  14. The inferior vena cava is normal in size with greater than 50%  respiratory variability, suggesting right atrial pressure of 3 mmHg.   Epic records are reviewed at length today  CHA2DS2-VASc Score = 4  The patient's score is based upon: CHF History: 0 HTN History: 1 Diabetes History: 0 Stroke History: 2 Vascular Disease History: 0 Age Score: 1 Gender Score: 0       ASSESSMENT AND PLAN: 1. Persistent Atrial Fibrillation (ICD10:  I48.19) The patient's CHA2DS2-VASc score is 4, indicating a 4.8% annual risk of stroke.   S/p afib ablation 09/10/20 Patient appears to be maintaining SR. Continue Eliquis 5 mg BID with no missed doses for 3 months post ablation.  Continue Toprol 50 mg BID (patient reports he has been taking twice daily)  2. Secondary Hypercoagulable State (ICD10:  D68.69) The patient is at significant risk for stroke/thromboembolism based upon his CHA2DS2-VASc Score of 4.  Continue Apixaban (Eliquis).   3. Obesity Body mass index is 33.65 kg/m. Lifestyle modification was discussed at length including regular exercise and weight reduction. Patient followed by Dr Leafy Ro.  4. Obstructive sleep apnea The importance of adequate treatment of sleep apnea was discussed today in order to improve our ability to maintain sinus rhythm long term. Patient reports compliance with CPAP therapy.  5. HTN Stable, no changes today.   Follow up with Dr Curt Bears and Dr Claiborne Billings as scheduled.    [facility] [address] [place], [place]  [address] [phone] 10/08/2020 2:33 PM

## 2020-10-14 ENCOUNTER — Ambulatory Visit (INDEPENDENT_AMBULATORY_CARE_PROVIDER_SITE_OTHER): Payer: Medicare PPO | Admitting: Family Medicine

## 2020-10-15 NOTE — Progress Notes (Signed)
[place] Phone: [phone] Subjective:   [name], am serving as a scribe for Dr. Hulan Saas.  This visit occurred during the SARS-CoV-2 public health emergency.  Safety protocols were in place, including screening questions prior to the visit, additional usage of staff PPE, and extensive cleaning of exam room while observing appropriate contact time as indicated for disinfecting solutions.   I'm seeing this patient by the request  of:  Kuneff, Renee A, DO  CC: Hip pain follow-up, back pain follow-up  [id number]  GARO HEIDELBERG is a 75 y.o. male coming in with complaint of back and neck pain. OMT on 07/22/2020. Patient states that he is feeling well post surgery.  Patient has been starting to increase activity.  Still having fatigue.  Humiliated that he would like to be seen weekly by finding it somewhat difficult.          Reviewed prior external information including notes and imaging from previsou exam, outside providers and external EMR if available.   As well as notes that were available from care everywhere and other healthcare systems.  Past medical history, social, surgical and family history all reviewed in electronic medical record.  No pertanent information unless stated regarding to the chief complaint.   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 Humphrey Rolls; CPAP titration 11/12/04- Dr. Humphrey Rolls    No Known Allergies   Review of Systems:  No headache, visual changes, nausea, vomiting, diarrhea,  constipation, dizziness, abdominal pain, skin rash, fevers, chills, night sweats, weight loss, swollen lymph nodes, body aches, joint swelling, chest pain, shortness of breath, mood changes. POSITIVE muscle aches  Objective  There were no vitals taken for this visit.   General: No apparent distress alert and oriented x3 mood and affect normal, dressed appropriately.  HEENT: Pupils equal, extraocular movements intact  Respiratory: Patient's speak in full sentences and does not appear short of breath  Cardiovascular: No lower extremity edema, non tender, no erythema  Gait normal with good balance and coordination.  MSK:  Non tender with full range of motion and good stability and symmetric strength and tone of shoulders, elbows, wrist, hip, knee and ankles bilaterally.  Back -low back exam does have some loss of lordosis.  Some tenderness to palpation in the paraspinal musculature.  Patient does have significant tightness of the hip flexors bilaterally.  Does have tightness of the right hip capsule noted as well.  Osteopathic findings  C2 flexed rotated and side bent right C7 flexed rotated and side bent left T3 extended rotated and side bent right inhaled rib T5 extended rotated and side bent left L2 flexed rotated and side bent right Sacrum right on right       Assessment and Plan:  Degenerative arthritis of left knee Known arthritic changes.  We will continue to monitor.  Doing well with the viscosupplementation.  We will continue to see how patient does and follow-up with  me again 6 weeks   Nonallopathic problems  Decision today to treat with OMT was based on Physical Exam  After verbal consent patient was treated with HVLA, ME, FPR techniques in  thoracic, lumbar, and sacral  areas  Patient tolerated the procedure well with improvement in symptoms  Patient given exercises, stretches and lifestyle modifications  See medications in patient instructions if given  Patient  will follow up in 4-8 weeks      The above documentation has been reviewed and is accurate and complete [name], DO        Note: This dictation was prepared with Dragon dictation along with smaller phrase technology. Any transcriptional errors that result from this process are unintentional.

## 2020-10-16 ENCOUNTER — Ambulatory Visit (INDEPENDENT_AMBULATORY_CARE_PROVIDER_SITE_OTHER): Payer: Medicare PPO | Admitting: Family Medicine

## 2020-10-16 ENCOUNTER — Other Ambulatory Visit: Payer: Self-pay

## 2020-10-16 VITALS — BP 112/80 | HR 64 | Ht 75.0 in

## 2020-10-16 DIAGNOSIS — M9904 Segmental and somatic dysfunction of sacral region: Secondary | ICD-10-CM

## 2020-10-16 DIAGNOSIS — R42 Dizziness and giddiness: Secondary | ICD-10-CM | POA: Diagnosis not present

## 2020-10-16 DIAGNOSIS — Z79899 Other long term (current) drug therapy: Secondary | ICD-10-CM | POA: Diagnosis not present

## 2020-10-16 DIAGNOSIS — H6123 Impacted cerumen, bilateral: Secondary | ICD-10-CM | POA: Diagnosis not present

## 2020-10-16 DIAGNOSIS — M1712 Unilateral primary osteoarthritis, left knee: Secondary | ICD-10-CM | POA: Diagnosis not present

## 2020-10-16 DIAGNOSIS — Z9889 Other specified postprocedural states: Secondary | ICD-10-CM | POA: Diagnosis not present

## 2020-10-16 DIAGNOSIS — H90A22 Sensorineural hearing loss, unilateral, left ear, with restricted hearing on the contralateral side: Secondary | ICD-10-CM | POA: Diagnosis not present

## 2020-10-16 DIAGNOSIS — Z8679 Personal history of other diseases of the circulatory system: Secondary | ICD-10-CM | POA: Diagnosis not present

## 2020-10-16 DIAGNOSIS — H903 Sensorineural hearing loss, bilateral: Secondary | ICD-10-CM | POA: Diagnosis not present

## 2020-10-16 DIAGNOSIS — M9902 Segmental and somatic dysfunction of thoracic region: Secondary | ICD-10-CM | POA: Diagnosis not present

## 2020-10-16 DIAGNOSIS — Z96643 Presence of artificial hip joint, bilateral: Secondary | ICD-10-CM | POA: Diagnosis not present

## 2020-10-16 DIAGNOSIS — M9903 Segmental and somatic dysfunction of lumbar region: Secondary | ICD-10-CM | POA: Diagnosis not present

## 2020-10-16 DIAGNOSIS — H8102 Meniere's disease, left ear: Secondary | ICD-10-CM | POA: Diagnosis not present

## 2020-10-16 NOTE — Assessment & Plan Note (Signed)
Known arthritic changes.  We will continue to monitor.  Doing well with the viscosupplementation.  We will continue to see how patient does and follow-up with me again 6 weeks

## 2020-10-16 NOTE — Assessment & Plan Note (Signed)
Patient is working with healthy weight and wellness.  Patient will start working on weight loss again.  Discussed icing regimen and home exercises.  Discussed isometrics and work with Product/process development scientist today.

## 2020-10-16 NOTE — Patient Instructions (Signed)
See me in 6 weeks

## 2020-10-20 ENCOUNTER — Encounter (INDEPENDENT_AMBULATORY_CARE_PROVIDER_SITE_OTHER): Payer: Self-pay

## 2020-10-22 ENCOUNTER — Other Ambulatory Visit: Payer: Self-pay

## 2020-10-22 ENCOUNTER — Other Ambulatory Visit (INDEPENDENT_AMBULATORY_CARE_PROVIDER_SITE_OTHER): Payer: Self-pay | Admitting: Family Medicine

## 2020-10-22 ENCOUNTER — Encounter (INDEPENDENT_AMBULATORY_CARE_PROVIDER_SITE_OTHER): Payer: Self-pay | Admitting: Family Medicine

## 2020-10-22 ENCOUNTER — Ambulatory Visit (INDEPENDENT_AMBULATORY_CARE_PROVIDER_SITE_OTHER): Payer: Medicare PPO | Admitting: Family Medicine

## 2020-10-22 ENCOUNTER — Ambulatory Visit (INDEPENDENT_AMBULATORY_CARE_PROVIDER_SITE_OTHER): Payer: Medicare PPO | Admitting: Psychologist

## 2020-10-22 VITALS — Ht 75.0 in | Wt 262.0 lb

## 2020-10-22 DIAGNOSIS — E66812 Obesity, class 2: Secondary | ICD-10-CM

## 2020-10-22 DIAGNOSIS — Z6835 Body mass index (BMI) 35.0-35.9, adult: Secondary | ICD-10-CM | POA: Diagnosis not present

## 2020-10-22 DIAGNOSIS — F411 Generalized anxiety disorder: Secondary | ICD-10-CM

## 2020-10-22 DIAGNOSIS — R7303 Prediabetes: Secondary | ICD-10-CM | POA: Diagnosis not present

## 2020-10-22 MED ORDER — METFORMIN HCL 500 MG PO TABS: 500.0000 mg | ORAL_TABLET | Freq: Every day | ORAL | 0 refills | Status: DC

## 2020-10-27 NOTE — Progress Notes (Signed)
Chief Complaint:   OBESITY Clayton [name] is here to discuss his progress with his obesity treatment plan along with follow-up of his obesity related diagnoses. Clayton [name] is on the Category 2 Plan and states he is following his eating plan approximately 50% of the time. Clayton [name] states he is doing 0 minutes 0 times per week.  Today's visit was #: 30 Starting weight: 266 lbs Starting date: 11/01/2017 Today's weight: 262 lbs Today's date: 10/22/2020 Total lbs lost to date: 4 Total lbs lost since last in-office visit: 0  Interim History: Clayton [name] has been recovering from hip replacement and cardioablation. He is back to walking and will start cycling sooner. He did some celebration eating and he is ready to get back on track.  Subjective:   1. Pre-diabetes Clayton [name] continues to work on diet and weight loss.  Assessment/Plan:   1. Pre-diabetes Clayton [name] will continue metformin, and we will refill for 1 month. He will continue to work on weight loss, exercise, and decreasing simple carbohydrates to help decrease the risk of diabetes.   - metFORMIN (GLUCOPHAGE) 500 MG tablet; Take 1 tablet (500 mg total) by mouth daily with breakfast.  Dispense: 30 tablet; Refill: 0  2. Obesity with current BMI 32.8 Clayton [name] is currently in the action stage of change. As such, his goal is to continue with weight loss efforts. He has agreed to following a lower carbohydrate, vegetable and lean protein rich diet plan. Clayton [name] is to keep his total carbohydrates below 30 grams.  Behavioral modification strategies: increasing lean protein intake and increasing water intake.  Clayton [name] has agreed to follow-up with our clinic in 4 weeks. He was informed of the importance of frequent follow-up visits to maximize his success with intensive lifestyle modifications for his multiple health conditions.   Objective:   Height 6\' 3"  (1.905 m), weight 262 lb (118.8 kg). Body mass index is 32.75 kg/m.  General: Cooperative, alert, well  developed, in no acute distress. HEENT: Conjunctivae and lids unremarkable. Cardiovascular: Regular rhythm.  Lungs: Normal work of breathing. Neurologic: No focal deficits.   Lab Results  Component Value Date   CREATININE 1.45 (H) 08/13/2020   BUN 26 (H) 08/13/2020   NA 137 08/13/2020   K 4.3 08/13/2020   CL 107 08/13/2020   CO2 23 08/13/2020   Lab Results  Component Value Date   ALT 27 08/12/2020   AST 23 08/12/2020   ALKPHOS 44 08/12/2020   BILITOT 0.8 08/12/2020   Lab Results  Component Value Date   HGBA1C 5.8 (H) 07/04/2020   HGBA1C 5.8 (H) 08/01/2019   HGBA1C 5.8 (H) 09/04/2018   HGBA1C 6.0 (H) 11/01/2017   HGBA1C 5.4 04/14/2016   Lab Results  Component Value Date   INSULIN 3.9 08/01/2019   INSULIN 9.0 09/04/2018   INSULIN 22.4 11/01/2017   Lab Results  Component Value Date   TSH 2.140 08/01/2020   Lab Results  Component Value Date   CHOL 120 08/01/2020   HDL 54 08/01/2020   LDLCALC 50 08/01/2020   TRIG 81 08/01/2020   CHOLHDL 2.2 08/01/2020   Lab Results  Component Value Date   VD25OH 39.95 01/24/2020   VD25OH 37.6 08/01/2019   VD25OH 43.2 09/04/2018   Lab Results  Component Value Date   WBC 9.9 08/13/2020   HGB 12.7 (L) 08/13/2020   HCT 37.5 (L) 08/13/2020   MCV 89.9 08/13/2020   PLT 179 08/13/2020   Lab Results  Component Value Date   IRON 71 01/24/2020  FERRITIN 77.1 01/24/2020   Attestation Statements:   Reviewed by clinician on day of visit: allergies, medications, problem list, medical history, surgical history, family history, social history, and previous encounter notes.   I, Trixie Dredge, am acting as transcriptionist for Dennard Nip, MD.  I have reviewed the above documentation for accuracy and completeness, and I agree with the above. -  Dennard Nip, MD

## 2020-11-06 DIAGNOSIS — H31003 Unspecified chorioretinal scars, bilateral: Secondary | ICD-10-CM | POA: Diagnosis not present

## 2020-11-06 DIAGNOSIS — H43811 Vitreous degeneration, right eye: Secondary | ICD-10-CM | POA: Diagnosis not present

## 2020-11-15 ENCOUNTER — Other Ambulatory Visit: Payer: Self-pay | Admitting: Cardiovascular Disease

## 2020-11-17 NOTE — Telephone Encounter (Signed)
Prescription refill request for Eliquis received. Indication:afib Last office visit:fenton 10/08/20 Scr: 1.45 08/13/20 Age: 57m Weight:118.8kg

## 2020-11-19 ENCOUNTER — Other Ambulatory Visit: Payer: Self-pay

## 2020-11-19 ENCOUNTER — Ambulatory Visit (INDEPENDENT_AMBULATORY_CARE_PROVIDER_SITE_OTHER): Payer: Medicare PPO | Admitting: Psychologist

## 2020-11-19 DIAGNOSIS — F411 Generalized anxiety disorder: Secondary | ICD-10-CM

## 2020-11-20 DIAGNOSIS — H43813 Vitreous degeneration, bilateral: Secondary | ICD-10-CM | POA: Diagnosis not present

## 2020-11-20 DIAGNOSIS — H31003 Unspecified chorioretinal scars, bilateral: Secondary | ICD-10-CM | POA: Diagnosis not present

## 2020-11-24 ENCOUNTER — Encounter (INDEPENDENT_AMBULATORY_CARE_PROVIDER_SITE_OTHER): Payer: Self-pay | Admitting: Family Medicine

## 2020-11-24 ENCOUNTER — Other Ambulatory Visit: Payer: Self-pay

## 2020-11-24 ENCOUNTER — Ambulatory Visit (INDEPENDENT_AMBULATORY_CARE_PROVIDER_SITE_OTHER): Payer: Medicare PPO | Admitting: Family Medicine

## 2020-11-24 VITALS — BP 143/89 | HR 86 | Temp 98.8°F | Ht 75.0 in | Wt 259.0 lb

## 2020-11-24 DIAGNOSIS — R7303 Prediabetes: Secondary | ICD-10-CM | POA: Diagnosis not present

## 2020-11-24 DIAGNOSIS — Z6835 Body mass index (BMI) 35.0-35.9, adult: Secondary | ICD-10-CM | POA: Diagnosis not present

## 2020-11-24 MED ORDER — METFORMIN HCL 500 MG PO TABS: 500.0000 mg | ORAL_TABLET | Freq: Every day | ORAL | 0 refills | Status: DC

## 2020-11-24 NOTE — Progress Notes (Signed)
Chief Complaint:   OBESITY Clayton [name] is here to discuss his progress with his obesity treatment plan along with follow-up of his obesity related diagnoses. Clayton [name] is on following a lower carbohydrate, vegetable and lean protein rich diet plan and states he is following his eating plan approximately 50% of the time. Clayton [name] states he is walking 2 miles 7 times per week.   Today's visit was #: 52 Starting weight: 266 lbs Starting date: 11/01/2017 Today's weight: 259 lbs Today's date: 11/24/2020 Total lbs lost to date: 7 Total lbs lost since last in-office visit: 3  Interim History: Clayton [name] continues  to do well with weight loss. He had extra challenges  with having to travel for a funeral. He has  increased walking and he feels well overall.  Subjective:   1. Pre-diabetes Clayton [name] has done well with getting back on track with his eating plan. He denies nausea, vomiting, or hypoglycemia.  Assessment/Plan:   1. Pre-diabetes Clayton [name] will continue with diet, exercise, and decreasing simple carbohydrates to help decrease the risk of diabetes. We will refill metformin for 1 month.  - metFORMIN (GLUCOPHAGE) 500 MG tablet; Take 1 tablet (500 mg total) by mouth daily with breakfast.  Dispense: 30 tablet; Refill: 0  2. Obesity with current BMI of 32.4 Clayton [name] is currently in the action stage of change. As such, his goal is to continue with weight loss efforts. He has agreed to the Category 2 Plan.   Exercise goals: As is.  Behavioral modification strategies: holiday eating strategies .  Clayton [name] has agreed to follow-up with our clinic in 5 weeks. He was informed of the importance of frequent follow-up visits to maximize his success with intensive lifestyle modifications for his multiple health conditions.   Objective:   Blood pressure (!) 143/89, pulse 86, temperature 98.8 F (37.1 C), height 6\' 3"  (1.905 m), weight 259 lb (117.5 kg), SpO2 97 %. Body mass index is 32.37 kg/m.  General:  Cooperative, alert, well developed, in no acute distress. HEENT: Conjunctivae and lids unremarkable. Cardiovascular: Regular rhythm.  Lungs: Normal work of breathing. Neurologic: No focal deficits.   Lab Results  Component Value Date   CREATININE 1.45 (H) 08/13/2020   BUN 26 (H) 08/13/2020   NA 137 08/13/2020   K 4.3 08/13/2020   CL 107 08/13/2020   CO2 23 08/13/2020   Lab Results  Component Value Date   ALT 27 08/12/2020   AST 23 08/12/2020   ALKPHOS 44 08/12/2020   BILITOT 0.8 08/12/2020   Lab Results  Component Value Date   HGBA1C 5.8 (H) 07/04/2020   HGBA1C 5.8 (H) 08/01/2019   HGBA1C 5.8 (H) 09/04/2018   HGBA1C 6.0 (H) 11/01/2017   HGBA1C 5.4 04/14/2016   Lab Results  Component Value Date   INSULIN 3.9 08/01/2019   INSULIN 9.0 09/04/2018   INSULIN 22.4 11/01/2017   Lab Results  Component Value Date   TSH 2.140 08/01/2020   Lab Results  Component Value Date   CHOL 120 08/01/2020   HDL 54 08/01/2020   LDLCALC 50 08/01/2020   TRIG 81 08/01/2020   CHOLHDL 2.2 08/01/2020   Lab Results  Component Value Date   VD25OH 39.95 01/24/2020   VD25OH 37.6 08/01/2019   VD25OH 43.2 09/04/2018   Lab Results  Component Value Date   WBC 9.9 08/13/2020   HGB 12.7 (L) 08/13/2020   HCT 37.5 (L) 08/13/2020   MCV 89.9 08/13/2020   PLT 179 08/13/2020   Lab Results  Component Value Date   IRON 71 01/24/2020   FERRITIN 77.1 01/24/2020    Obesity Behavioral Intervention:   Approximately 15 minutes were spent on the discussion below.  ASK: We discussed the diagnosis of obesity with Clayton [name] today and Clayton [name] agreed to give [name] permission to discuss obesity behavioral modification therapy today.  ASSESS: Oneill has the diagnosis of obesity and his BMI today is 32.4. Clayton [name] is in the action stage of change.   ADVISE: Clayton [name] was educated on the multiple health risks of obesity as well as the benefit of weight loss to improve his health. He was advised of the need for  long term treatment and the importance of lifestyle modifications to improve his current health and to decrease his risk of future health problems.  AGREE: Multiple dietary modification options and treatment options were discussed and Kharter agreed to follow the recommendations documented in the above note.  ARRANGE: Clayton [name] was educated on the importance of frequent visits to treat obesity as outlined per CMS and USPSTF guidelines and agreed to schedule his next follow up appointment today.  Attestation Statements:   Reviewed by clinician on day of visit: allergies, medications, problem list, medical history, surgical history, family history, social history, and previous encounter notes.   I, Trixie Dredge, am acting as transcriptionist for Dennard Nip, MD.  I have reviewed the above documentation for accuracy and completeness, and I agree with the above. -  Dennard Nip, MD

## 2020-11-27 ENCOUNTER — Ambulatory Visit: Payer: Medicare PPO | Admitting: Sports Medicine

## 2020-11-27 ENCOUNTER — Other Ambulatory Visit: Payer: Self-pay

## 2020-11-27 VITALS — BP 132/84 | HR 64 | Ht 75.0 in | Wt 270.0 lb

## 2020-11-27 DIAGNOSIS — M9901 Segmental and somatic dysfunction of cervical region: Secondary | ICD-10-CM | POA: Diagnosis not present

## 2020-11-27 DIAGNOSIS — G8929 Other chronic pain: Secondary | ICD-10-CM

## 2020-11-27 DIAGNOSIS — M9908 Segmental and somatic dysfunction of rib cage: Secondary | ICD-10-CM

## 2020-11-27 DIAGNOSIS — M9902 Segmental and somatic dysfunction of thoracic region: Secondary | ICD-10-CM | POA: Diagnosis not present

## 2020-11-27 DIAGNOSIS — M9903 Segmental and somatic dysfunction of lumbar region: Secondary | ICD-10-CM

## 2020-11-27 DIAGNOSIS — M546 Pain in thoracic spine: Secondary | ICD-10-CM

## 2020-11-27 DIAGNOSIS — M9905 Segmental and somatic dysfunction of pelvic region: Secondary | ICD-10-CM | POA: Diagnosis not present

## 2020-11-27 NOTE — Progress Notes (Signed)
Benito Mccreedy D.[facility] [place] Phone: [phone]   Assessment and Plan:    1. Chronic bilateral thoracic back pain 2. Somatic dysfunction of cervical region 3. Somatic dysfunction of thoracic region 4. Somatic dysfunction of lumbar region 5. Somatic dysfunction of pelvic region 6. Somatic dysfunction of rib region -Chronic with exacerbation, subsequent visit - Recurrence of chronic musculoskeletal complaints with most prominent being in thoracic spine and neck - Patient has received significant relief with OMT in the past.  Elects for repeat OMT today.  Tolerated well per note below. - Decision today to treat with OMT was based on Physical Exam   After verbal consent patient was treated with HVLA (high velocity low amplitude), ME (muscle energy), FPR (flex positional release), ST (soft tissue), PC/PD (Pelvic Compression/ Pelvic Decompression) techniques in cervical, rib, thoracic, lumbar, and pelvic areas. Patient tolerated the procedure well with improvement in symptoms.  Patient educated on potential side effects of soreness and recommended to rest, hydrate, and use Tylenol as needed for pain control.   Pertinent previous records reviewed include none   Follow Up: 6 weeks for repeat OMT    Subjective:   I, Judy Pimple, am serving as a scribe for Dr. Glennon Mac  Chief Complaint: Neck and back pain   HPI:   11/27/20 Patient is a 75 year old male presenting with neck and back pain. Patient was last seen by Dr. Tamala Julian on 10/16/20 for this reason and had OMT. Today patient states neck and back are doing well just here for maintenance OMT.  Relevant Historical Information: Bilateral hip replacements, history of A. fib with recent ablation  Additional pertinent review of systems negative.  Current Outpatient Medications  Medication Sig Dispense Refill   amLODipine (NORVASC) 5 MG tablet TAKE 1.5 TABLETS BY MOUTH  DAILY. 135 tablet 2   Coenzyme Q10 200 MG capsule Take 200 mg by mouth in the morning.     ELIQUIS 5 MG TABS tablet TAKE 1 TABLET BY MOUTH TWICE A DAY 180 tablet 1   ezetimibe (ZETIA) 10 MG tablet Take 1 tablet (10 mg total) by mouth in the morning. 90 tablet 3   metFORMIN (GLUCOPHAGE) 500 MG tablet Take 1 tablet (500 mg total) by mouth daily with breakfast. 30 tablet 0   metoprolol succinate (TOPROL-XL) 50 MG 24 hr tablet Take 1 tablet (50 mg total) by mouth daily. Take with or immediately following a meal. (Patient taking differently: Take 50 mg by mouth 2 (two) times daily. Take with or immediately following a meal.) 30 tablet 6   olmesartan (BENICAR) 40 MG tablet TAKE 1 TABLET BY MOUTH EVERY DAY 90 tablet 3   Pitavastatin Calcium (LIVALO) 2 MG TABS Take 1 tablet (2 mg total) by mouth every evening. 90 tablet 3   spironolactone (ALDACTONE) 25 MG tablet TAKE 1/2 TABLET BY MOUTH EVERY DAY 45 tablet 3   tamsulosin (FLOMAX) 0.4 MG CAPS capsule Take 0.4 mg by mouth in the morning.     No current facility-administered medications for this visit.      Objective:     Vitals:   11/27/20 1251  BP: 132/84  Pulse: 64  SpO2: 98%  Weight: 270 lb (122.5 kg)  Height: 6\' 3"  (1.905 m)      Body mass index is 33.75 kg/m.    Physical Exam:     General: Well-appearing, cooperative, sitting comfortably in no acute distress.   OMT Physical Exam:  ASIS Compression Test: Positive Right Cervical: TTP paraspinal, C2 RRSL Rib: Bilateral elevated first rib with TTP Thoracic: TTP paraspinal, T2-5 RLSR Lumbar: TTP paraspinal, L1-3 RRSL Pelvis: Right anterior innominate  Electronically signed by:  Benito Mccreedy D.[facility] 1:50 PM 11/27/20

## 2020-11-27 NOTE — Patient Instructions (Addendum)
Good to see you   Follow up in 6 weeks  

## 2020-11-29 ENCOUNTER — Encounter: Payer: Self-pay | Admitting: Family Medicine

## 2020-12-16 ENCOUNTER — Encounter: Payer: Self-pay | Admitting: Cardiology

## 2020-12-16 ENCOUNTER — Other Ambulatory Visit: Payer: Self-pay

## 2020-12-16 ENCOUNTER — Ambulatory Visit: Payer: Medicare PPO | Admitting: Cardiology

## 2020-12-16 VITALS — BP 150/96 | HR 88 | Ht 75.0 in | Wt 266.0 lb

## 2020-12-16 DIAGNOSIS — I4819 Other persistent atrial fibrillation: Secondary | ICD-10-CM

## 2020-12-16 NOTE — Progress Notes (Signed)
Electrophysiology Office Note   Date:  12/16/2020   ID:  [name], DOB 05-31-1945, MRN [id number]  PCP:  Clayton Hillock, DO  Cardiologist:  Claiborne Billings Primary Electrophysiologist:  Clayton Belmontes Meredith Leeds, MD    Chief Complaint: AF   History of Present Illness: Clayton [name] is [name] 75 y.o. male who is being seen today for the evaluation of AF at the request of Kuneff, Renee A, DO. Presenting today for electrophysiology evaluation.  He has [name] history significant for atrial fibrillation, hypertension, obstructive sleep apnea.  He was initially on flecainide.  He had multiple cardioversions.  As he had not remained in rhythm, he is now status post ablation 09/02/2020.  Today, denies symptoms of palpitations, chest pain, shortness of breath, orthopnea, PND, lower extremity edema, claudication, dizziness, presyncope, syncope, bleeding, or neurologic sequela. The patient is tolerating medications without difficulties.  Since his ablation he has done well.  He is overall happy with his control.  He has noted no further episodes of atrial fibrillation.  He continues to walk 2 miles 6 days [name] week and ride his stationary bike.  He also continues to play golf.  He is excited about being more active.  He has [name] goal to lose up to 50 pounds.   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 Humphrey Rolls; CPAP titration 11/12/04- Dr. Humphrey Rolls   Past Surgical History:  Procedure Laterality Date   ATRIAL FIBRILLATION ABLATION N/[name] 09/10/2020   Procedure: ATRIAL FIBRILLATION ABLATION;  Surgeon: Clayton Haw, MD;  Location: [facility];  Service: Cardiovascular;  Laterality: N/[name];   CARDIAC CATHETERIZATION  11/03/2005   CARDIOVERSION N/[name] 09/26/2018   Procedure: CARDIOVERSION;  Surgeon: Clayton Rile, MD;  Location: [facility];  Service: Endoscopy;  Laterality: N/[name];   CARDIOVERSION N/[name] 03/13/2019   Procedure: CARDIOVERSION;  Surgeon: Clayton Latch, MD;  Location: [facility];  Service: Cardiovascular;  Laterality: N/[name];   CATARACT EXTRACTION  01/19/2012   KNEE ARTHROSCOPY     left knee- 2002    TOTAL HIP ARTHROPLASTY  06/08/2011   Procedure: TOTAL HIP ARTHROPLASTY ANTERIOR APPROACH;  Surgeon: Clayton Pole, MD;  Location: [facility];  Service: Orthopedics;  Laterality: Left;   TOTAL HIP ARTHROPLASTY Right 08/12/2020   ANTERIOR APPROACH   TOTAL HIP ARTHROPLASTY Right 08/12/2020   Procedure: TOTAL HIP ARTHROPLASTY ANTERIOR APPROACH;  Surgeon: Clayton Cancel, MD;  Location: [facility];  Service: Orthopedics;  Laterality: Right;     Current Outpatient Medications  Medication Sig Dispense Refill   amLODipine (NORVASC) 5 MG tablet TAKE 1.5 TABLETS BY MOUTH DAILY. 135 tablet 2   Coenzyme Q10 200 MG capsule Take 200 mg by mouth in the morning.     ELIQUIS 5 MG TABS tablet TAKE 1 TABLET BY MOUTH TWICE [name] DAY 180 tablet 1   ezetimibe (ZETIA) 10 MG tablet Take 1 tablet (10 mg total) by mouth in the morning. 90 tablet 3   metFORMIN (GLUCOPHAGE) 500 MG tablet Take 1  tablet (500 mg total) by mouth daily with breakfast. 30 tablet 0   metoprolol succinate (TOPROL-XL) 50 MG 24 hr tablet Take 100 mg by mouth 2 (two) times daily. Take with or immediately following [name] meal.     olmesartan (BENICAR) 40 MG tablet TAKE 1 TABLET BY MOUTH EVERY DAY 90 tablet 3   Pitavastatin Calcium (LIVALO) 2 MG TABS Take 1 tablet (2 mg total) by mouth every evening. 90 tablet 3   spironolactone (ALDACTONE) 25 MG tablet TAKE 1/2 TABLET BY MOUTH EVERY DAY 45 tablet 3   tamsulosin (FLOMAX) 0.4 MG CAPS capsule Take 0.4 mg by mouth in the  morning.     No current facility-administered medications for this visit.    Allergies:   Patient has no known allergies.   Social History:  The patient  reports that he has never smoked. He has never used smokeless tobacco. He reports that he does not drink alcohol and does not use drugs.   Family History:  The patient's family history includes Arthritis in his father; Diabetes in his maternal grandfather, mother, and sister; Heart disease in his father, maternal grandfather, and paternal grandfather; Hyperlipidemia in his mother; Hypertension in his maternal grandfather and mother; Obesity in his mother; Stroke in his maternal grandmother and mother.   ROS:  Please see the history of present illness.   Otherwise, review of systems is positive for none.   All other systems are reviewed and negative.   PHYSICAL EXAM: VS:  BP (!) 150/96   Pulse 88   Ht 6\' 3"  (1.905 m)   Wt 266 lb (120.7 kg)   SpO2 98%   BMI 33.25 kg/m  , BMI Body mass index is 33.25 kg/m. GEN: Well nourished, well developed, in no acute distress  HEENT: normal  Neck: no JVD, carotid bruits, or masses Cardiac: RRR; no murmurs, rubs, or gallops,no edema  Respiratory:  clear to auscultation bilaterally, normal work of breathing GI: soft, nontender, nondistended, + BS MS: no deformity or atrophy  Skin: warm and dry Neuro:  Strength and sensation are intact Psych: euthymic mood, full affect  EKG:  EKG is ordered today. Personal review of the ekg ordered shows sinus rhythm, right bundle branch block   Recent Labs: 08/01/2020: TSH 2.140 08/12/2020: ALT 27 08/13/2020: BUN 26; Creatinine, Ser 1.45; Hemoglobin 12.7; Platelets 179; Potassium 4.3; Sodium 137    Lipid Panel     Component Value Date/Time   CHOL 120 08/01/2020 1031   TRIG 81 08/01/2020 1031   HDL 54 08/01/2020 1031   CHOLHDL 2.2 08/01/2020 1031   CHOLHDL 3.8 10/12/2016 0902   VLDL 17 04/14/2016 0937   LDLCALC 50 08/01/2020 1031   LDLCALC 119 (H)  10/12/2016 0902     Wt Readings from Last 3 Encounters:  12/16/20 266 lb (120.7 kg)  11/27/20 270 lb (122.5 kg)  11/24/20 259 lb (117.5 kg)      Other studies Reviewed: Additional studies/ records that were reviewed today include: TTE 02/01/2019 Review of the above records today demonstrates:   1. Left ventricular ejection fraction, by visual estimation, is 50 to 55%. The left ventricle has low normal function. There is moderately increased left ventricular hypertrophy.  2. Left ventricular diastolic function could not be evaluated.  3. Mildly dilated left ventricular internal cavity size.  4. The left ventricle has no regional wall motion abnormalities.  5. Global right ventricle has normal systolic function.The right ventricular size is normal. No increase in right ventricular wall  thickness.  6. Left atrial size was mildly dilated.  7. Right atrial size was normal.  8. The mitral valve is normal in structure. Trivial mitral valve regurgitation.  9. The tricuspid valve is normal in structure. 10. The aortic valve is tricuspid. Aortic valve regurgitation is not visualized. No evidence of aortic valve sclerosis or stenosis. 11. The pulmonic valve was grossly normal. Pulmonic valve regurgitation is not visualized. 12. Aortic dilatation noted. 13. There is mild dilatation of the ascending aorta measuring 39 mm. 14. The inferior vena cava is normal in size with greater than 50% respiratory variability, suggesting right atrial pressure of 3 mmHg.   ASSESSMENT AND PLAN:  1.  Persistent atrial fibrillation: CHA2DS2-VASc of 2.  Currently on metoprolol, Eliquis.  As he wanted to get off of flecainide, he has not status post atrial fibrillation ablation 09/02/2020.  He is remained in sinus rhythm.  He has had no further episodes of atrial fibrillation and is overall happy with his control.  He continues to exercise without issue.  We Amish Mintzer continue with current management.  2.  Hypertension:  Elevated today.  Usually well controlled.  No changes.  3.  Obstructive sleep apnea: CPAP compliance encouraged  Current medicines are reviewed at length with the patient today.   The patient does not have concerns regarding his medicines.  The following changes were made today: None  Labs/ tests ordered today include:  Orders Placed This Encounter  Procedures   EKG 12-Lead       Disposition:   FU with Jaielle Dlouhy 3 months  Signed, Carl Bleecker Meredith Leeds, MD  12/16/2020 11:09 AM     [place] [address] [phone] (office) [phone] (fax)

## 2020-12-16 NOTE — Patient Instructions (Signed)
Medication Instructions:  Your physician recommends that you continue on your current medications as directed. Please refer to the Current Medication list given to you today.  *If you need a refill on your cardiac medications before your next appointment, please call your pharmacy*   Lab Work: None ordered   Testing/Procedures: None ordered   Follow-Up: At [facility], you and your health needs are our priority.  As part of our continuing mission to provide you with exceptional heart care, we have created designated Provider Care Teams.  These Care Teams include your primary Cardiologist (physician) and Advanced Practice Providers (APPs -  Physician Assistants and Nurse Practitioners) who all work together to provide you with the care you need, when you need it.  Your next appointment:   3 month(s)  The format for your next appointment:   In Person  Provider:   Will Camnitz, MD    Thank you for choosing [facility]!!   Esdras Delair, RN [phone]     

## 2020-12-23 ENCOUNTER — Ambulatory Visit (INDEPENDENT_AMBULATORY_CARE_PROVIDER_SITE_OTHER): Payer: Medicare PPO | Admitting: Psychologist

## 2020-12-23 ENCOUNTER — Other Ambulatory Visit: Payer: Self-pay

## 2020-12-23 DIAGNOSIS — F411 Generalized anxiety disorder: Secondary | ICD-10-CM | POA: Diagnosis not present

## 2020-12-23 NOTE — Progress Notes (Signed)
[facility] Counselor/Therapist Progress Note  Patient ID: Clayton [name], MRN: [id number],    Date: 12/23/2020  Time Spent: 9:02 am to 9:34 am. Total time spent 32 minutes.    This session was held via in person. The patient consented to in-person therapy and was in the clinician's office. Limits of confidentiality were discussed with the patient.   Treatment Type: Individual Therapy  Reported Symptoms: Patient described himself as experiencing less anxiety symptoms. Patient stated that a majority of the symptoms he is experiencing is when he first wakes up in the morning.   Mental Status Exam: Appearance:  Well Groomed     Behavior: Appropriate  Motor: Normal  Speech/Language:  Normal Rate  Affect: Appropriate  Mood: normal  Thought process: normal  Thought content:   WNL  Sensory/Perceptual disturbances:   WNL  Orientation: oriented to person  Attention: Good  Concentration: Good  Memory: WNL  Fund of knowledge:  Good  Insight:   Fair  Judgment:  Good  Impulse Control: Good   Risk Assessment: Danger to Self:  No Self-injurious Behavior: No Danger to Others: No Duty to Warn:no Physical Aggression / Violence:No  Access to Firearms a concern: No  Gang Involvement:No   Subjective:  The patient described himself as doing well indicating that coping skills have assisted him. Elaborating, the patient indicated that he primarily is experiencing high blood pressure upon awakening. The patient spent time reflecting on different strategies that could facilitate better blood pressures after waking up. He processed thoughts and emotions related to this challenge. He asked to follow up and was agreeable to the homework assignment. He denied suicidal and homicidal ideation.   Interventions: Worked on developing a therapeutic relationship with the patient using active listening and reflective statements. Provided emotional support using empathy and validation. Used  summary statements. Processed thoughts and emotions related to the holiday season. Used summary statements. Normalized and validated expressed thoughts and emotions. Explored factors that could contribute to increased blood pressure upon waking. Provided psychoeducation about how different positions could contribute to blood pressure problems. Processed implementing mindfulness before bedtime. Explored what relaxation strategies patient could use before bedtime. Assisted in problem solving. Used socratic questions to assist the patient.  Assigned homework. Assessed for suicidal and homicidal ideation  Homework: Implement mindfulness and relaxation strategies before bedtime.   Diagnosis: F41.1 generalized anxiety disorder.   Plan: Implement mindfulness and relaxation strategies before bedtime. Patient will follow up in January. Patient's treatment plan is located in therapy charts. Patient agrees to treatment plan located in therapy charts.   Conception Chancy, PsyD

## 2020-12-29 ENCOUNTER — Other Ambulatory Visit: Payer: Self-pay

## 2020-12-29 ENCOUNTER — Encounter (INDEPENDENT_AMBULATORY_CARE_PROVIDER_SITE_OTHER): Payer: Self-pay | Admitting: Family Medicine

## 2020-12-29 ENCOUNTER — Ambulatory Visit (INDEPENDENT_AMBULATORY_CARE_PROVIDER_SITE_OTHER): Payer: Medicare PPO | Admitting: Family Medicine

## 2020-12-29 VITALS — BP 161/91 | HR 83 | Temp 98.1°F | Ht 75.0 in | Wt 255.0 lb

## 2020-12-29 DIAGNOSIS — I10 Essential (primary) hypertension: Secondary | ICD-10-CM

## 2020-12-29 DIAGNOSIS — Z6835 Body mass index (BMI) 35.0-35.9, adult: Secondary | ICD-10-CM | POA: Diagnosis not present

## 2020-12-29 DIAGNOSIS — R7303 Prediabetes: Secondary | ICD-10-CM | POA: Diagnosis not present

## 2020-12-29 MED ORDER — METFORMIN HCL 500 MG PO TABS: 500.0000 mg | ORAL_TABLET | Freq: Every day | ORAL | 0 refills | Status: DC

## 2020-12-29 NOTE — Progress Notes (Signed)
Chief Complaint:   OBESITY Clayton [name] is here to discuss his progress with his obesity treatment plan along with follow-up of his obesity related diagnoses. Clayton [name] is on the Category 2 Plan and states he is following his eating plan approximately 85% of the time. Clayton [name] states he is walking and biking for 60 minutes 7 times per week.  Today's visit was #: 56 Starting weight: 266 lbs Starting date: 11/01/2017 Today's weight: 255 lbs Today's date: 12/29/2020 Total lbs lost to date: 11 Total lbs lost since last in-office visit: 4  Interim History: Clayton [name] has done very well with weight loss. He has increased his exercise, and he feels well overall. He will be traveling over Christmas, but he feels he will do well with avoiding weight gain then as well.  Subjective:   1. Pre-diabetes Clayton [name] is stable on metformin, with no side effects noted. He is working on diet and exercise regularly.  2. Essential hypertension Clayton [name] is stable on his medications, but his blood pressure is elevated again. He is going to see his Cardiologist, Dr. Claiborne Billings soon and he will address this. He is doing well with diet and exercise.  Assessment/Plan:   1. Pre-diabetes Clayton [name] will continue to work on weight loss, exercise, and decreasing simple carbohydrates to help decrease the risk of diabetes. We will refill metformin 500 mg q AM for 90 days with no refills.   2. Essential hypertension Clayton [name] is to continue diet, exercise, and he will discuss with Dr. Claiborne Billings in regards to his elevated blood pressure. He will watch for signs of hypotension as he continues his lifestyle modifications.  3. Obesity BMI today is 46 Clayton [name] is currently in the action stage of change. As such, his goal is to continue with weight loss efforts. He has agreed to the Category 2 Plan.   Exercise goals: As is.  Behavioral modification strategies: increasing lean protein intake, holiday eating strategies , and celebration eating  strategies.  Clayton [name] has agreed to follow-up with our clinic in 6 weeks. He was informed of the importance of frequent follow-up visits to maximize his success with intensive lifestyle modifications for his multiple health conditions.   Objective:   Blood pressure (!) 161/91, pulse 83, temperature 98.1 F (36.7 C), height 6\' 3"  (1.905 m), weight 255 lb (115.7 kg), SpO2 99 %. Body mass index is 31.87 kg/m.  General: Cooperative, alert, well developed, in no acute distress. HEENT: Conjunctivae and lids unremarkable. Cardiovascular: Regular rhythm.  Lungs: Normal work of breathing. Neurologic: No focal deficits.   Lab Results  Component Value Date   CREATININE 1.45 (H) 08/13/2020   BUN 26 (H) 08/13/2020   NA 137 08/13/2020   K 4.3 08/13/2020   CL 107 08/13/2020   CO2 23 08/13/2020   Lab Results  Component Value Date   ALT 27 08/12/2020   AST 23 08/12/2020   ALKPHOS 44 08/12/2020   BILITOT 0.8 08/12/2020   Lab Results  Component Value Date   HGBA1C 5.8 (H) 07/04/2020   HGBA1C 5.8 (H) 08/01/2019   HGBA1C 5.8 (H) 09/04/2018   HGBA1C 6.0 (H) 11/01/2017   HGBA1C 5.4 04/14/2016   Lab Results  Component Value Date   INSULIN 3.9 08/01/2019   INSULIN 9.0 09/04/2018   INSULIN 22.4 11/01/2017   Lab Results  Component Value Date   TSH 2.140 08/01/2020   Lab Results  Component Value Date   CHOL 120 08/01/2020   HDL 54 08/01/2020   LDLCALC 50 08/01/2020  TRIG 81 08/01/2020   CHOLHDL 2.2 08/01/2020   Lab Results  Component Value Date   VD25OH 39.95 01/24/2020   VD25OH 37.6 08/01/2019   VD25OH 43.2 09/04/2018   Lab Results  Component Value Date   WBC 9.9 08/13/2020   HGB 12.7 (L) 08/13/2020   HCT 37.5 (L) 08/13/2020   MCV 89.9 08/13/2020   PLT 179 08/13/2020   Lab Results  Component Value Date   IRON 71 01/24/2020   FERRITIN 77.1 01/24/2020    Obesity Behavioral Intervention:   Approximately 15 minutes were spent on the discussion below.  ASK: We  discussed the diagnosis of obesity with Clayton [name] today and Clayton [name] agreed to give [name] permission to discuss obesity behavioral modification therapy today.  ASSESS: Clayton [name] has the diagnosis of obesity and his BMI today is 32.0. Clayton [name] is in the action stage of change.   ADVISE: Clayton [name] was educated on the multiple health risks of obesity as well as the benefit of weight loss to improve his health. He was advised of the need for long term treatment and the importance of lifestyle modifications to improve his current health and to decrease his risk of future health problems.  AGREE: Multiple dietary modification options and treatment options were discussed and Clayton [name] agreed to follow the recommendations documented in the above note.  ARRANGE: Clayton [name] was educated on the importance of frequent visits to treat obesity as outlined per CMS and USPSTF guidelines and agreed to schedule his next follow up appointment today.  Attestation Statements:   Reviewed by clinician on day of visit: allergies, medications, problem list, medical history, surgical history, family history, social history, and previous encounter notes.   I, Trixie Dredge, am acting as transcriptionist for Dennard Nip, MD.  I have reviewed the above documentation for accuracy and completeness, and I agree with the above. -  Dennard Nip, MD

## 2021-01-20 ENCOUNTER — Other Ambulatory Visit: Payer: Self-pay

## 2021-01-20 ENCOUNTER — Ambulatory Visit: Payer: Medicare PPO | Admitting: Cardiovascular Disease

## 2021-01-20 NOTE — Progress Notes (Signed)
[place] [facility] Phone: [phone] Subjective:   Clayton [name], am serving as a scribe for Dr. Hulan Saas. This visit occurred during the SARS-CoV-2 public health emergency.  Safety protocols were in place, including screening questions prior to the visit, additional usage of staff PPE, and extensive cleaning of exam room while observing appropriate contact time as indicated for disinfecting solutions.   I'm seeing this patient by the request  of:  Kuneff, Renee A, DO  CC: Back pain follow-up  XNA:[id number]  Clayton [name] is a 76 y.o. male coming in with complaint of back and neck pain. OMT on 11/27/2020 with Dr. Glennon Mac. Patient states that he rides bike and walks during the day. Hip and back are feeling good. L knee is stronger now that he is biking and feels like he can forgoe an injection today. Has been using ice and TENS as well.   Medications patient has been prescribed: None  Taking:         Reviewed prior external information including notes and imaging from previsou exam, outside providers and external EMR if available.   As well as notes that were available from care everywhere and other healthcare systems.  Past medical history, social, surgical and family history all reviewed in electronic medical record.  [name] pertanent information unless stated regarding to the chief complaint.   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 Humphrey Rolls; CPAP titration 11/12/04- Dr. Humphrey Rolls    [name] Known Allergies   Review of Systems:   [name] headache, visual changes, nausea, vomiting, diarrhea, constipation, dizziness, abdominal pain, skin rash, fevers, chills, night sweats, weight loss, swollen lymph nodes, body aches, joint swelling, chest pain, shortness of breath, mood changes. POSITIVE muscle aches  Objective  Blood pressure (!) 122/92, pulse 63, height 6\' 3"  (1.905 m), weight 263 lb (119.3 kg), SpO2 99 %.   General: [name] apparent distress alert and oriented x3 mood and affect normal, dressed appropriately.  HEENT: Pupils equal, extraocular movements intact  Respiratory: Patient's speak in full sentences and does not appear short of breath  Cardiovascular: [name] lower extremity edema, non tender, [name] erythema  Low back exam does have some mild loss of lordosis.  Patient does have some mild tightness noted.  Left knee still has the arthritic changes but not having difficulty.  Mild increase in tightness in the thoracolumbar noted.  Osteopathic findings  T9 extended rotated and side bent left L2 flexed rotated and side bent right Sacrum right on right       Assessment and Plan:  Degenerative disc disease, lumbar Patient is responding relatively well to osteopathic manipulation.  Patient is stable overall with [name] significant worsening.  Patient is going to start increasing activity.  Increase activity slowly.  Follow-up again in 6 weeks    Nonallopathic problems  Decision today to treat with OMT was based on Physical Exam  After verbal consent patient was treated with HVLA, ME, FPR  techniques in  thoracic, lumbar, and sacral  areas  Patient tolerated the procedure well with improvement in symptoms  Patient given exercises, stretches and lifestyle modifications  See medications in patient instructions if given  Patient will follow up in 4-8 weeks      The above documentation has been reviewed and is accurate and complete [name], DO        Note: This dictation was prepared with Dragon dictation  along with smaller phrase technology. Any transcriptional errors that result from this process are unintentional.

## 2021-01-21 ENCOUNTER — Ambulatory Visit (INDEPENDENT_AMBULATORY_CARE_PROVIDER_SITE_OTHER): Payer: Medicare PPO | Admitting: Family Medicine

## 2021-01-21 VITALS — BP 122/92 | HR 63 | Ht 75.0 in | Wt 263.0 lb

## 2021-01-21 DIAGNOSIS — M9904 Segmental and somatic dysfunction of sacral region: Secondary | ICD-10-CM | POA: Diagnosis not present

## 2021-01-21 DIAGNOSIS — M9903 Segmental and somatic dysfunction of lumbar region: Secondary | ICD-10-CM

## 2021-01-21 DIAGNOSIS — M51369 Other intervertebral disc degeneration, lumbar region without mention of lumbar back pain or lower extremity pain: Secondary | ICD-10-CM

## 2021-01-21 DIAGNOSIS — M9902 Segmental and somatic dysfunction of thoracic region: Secondary | ICD-10-CM | POA: Diagnosis not present

## 2021-01-21 DIAGNOSIS — M5136 Other intervertebral disc degeneration, lumbar region: Secondary | ICD-10-CM

## 2021-01-21 NOTE — Patient Instructions (Signed)
See me in 5-6 weeks 

## 2021-01-21 NOTE — Assessment & Plan Note (Signed)
Patient is responding relatively well to osteopathic manipulation.  Patient is stable overall with no significant worsening.  Patient is going to start increasing activity.  Increase activity slowly.  Follow-up again in 6 weeks

## 2021-01-22 ENCOUNTER — Ambulatory Visit: Payer: Medicare PPO | Admitting: Cardiovascular Disease

## 2021-01-22 ENCOUNTER — Encounter: Payer: Self-pay | Admitting: Cardiovascular Disease

## 2021-01-22 ENCOUNTER — Other Ambulatory Visit: Payer: Self-pay

## 2021-01-22 VITALS — BP 168/91 | HR 76 | Ht 75.0 in | Wt 261.6 lb

## 2021-01-22 DIAGNOSIS — Z9989 Dependence on other enabling machines and devices: Secondary | ICD-10-CM

## 2021-01-22 DIAGNOSIS — G4733 Obstructive sleep apnea (adult) (pediatric): Secondary | ICD-10-CM

## 2021-01-22 DIAGNOSIS — I480 Paroxysmal atrial fibrillation: Secondary | ICD-10-CM | POA: Diagnosis not present

## 2021-01-22 DIAGNOSIS — Z7901 Long term (current) use of anticoagulants: Secondary | ICD-10-CM

## 2021-01-22 DIAGNOSIS — E782 Mixed hyperlipidemia: Secondary | ICD-10-CM | POA: Diagnosis not present

## 2021-01-22 DIAGNOSIS — R7303 Prediabetes: Secondary | ICD-10-CM

## 2021-01-22 DIAGNOSIS — E669 Obesity, unspecified: Secondary | ICD-10-CM | POA: Diagnosis not present

## 2021-01-22 DIAGNOSIS — I10 Essential (primary) hypertension: Secondary | ICD-10-CM | POA: Diagnosis not present

## 2021-01-22 NOTE — Progress Notes (Signed)
Patient ID: Clayton [name], male   DOB: 06-Jun-1945, 76 y.o.   MRN: [id number]     HPI: Clayton [name] is a 76 y.o. male who presents for a 4 month follow-up cardiology evaluation.  Clayton [name] has a history of hypertension, a remote history of PAF, status post cardioversion in 2007, obstructive sleep apnea for which he uses CPAP 100% of the time, hypertension, and hyperlipidemia. He has had weight fluctuations over the years.  In the past, he also had a history of Mnire's disease.  There was some concern by Clayton [name] that perhaps this may have been related to statin use, which led to its discontinuance.  He is not had Mnire's disease in some time.  He underwent left hip replacement surgery by Clayton [name] and now has been able to continue to be active and exercises routinely.   His  blood pressure regimen has been amlodipine/benazepril 10/40 daily, hydrochlorothiazide has been taking 25 Mill grams in the evening and Toprol-XL 100 mg daily. He had atrial of livalo  For hyperlipidemia but developed some diarrhea secondary to this.    He has not had much success with weight loss over the past several years.    He continues to use his CPAP therapy with 100% compliance.  He will not even take a nap without his CPAP therapy and he takes his CPAP unit on alll his travels.   Since initiating CPAP therapy, he is unaware of any palpitations.  He denies any recurrent atrial fibrillation.  In the past he has had difficulty with family stress and his father had passed away, and his wife's mother recently died after having developed lung CA and had significant PVD.  He was not exercising as much as he had in the past.  He has not been successful in weight loss.  He continues to be active and still walks 3-4 miles per day.  Oftentimes in the morning when he awakens his blood pressure is elevated at 150/90 but after exercise it drops to 128/80.  He is unaware of any recurrent atrial fibrillation.  He continues  to use CPAP with 100% compliance.   He completed a two-year grieving process.  On 01/19/2016 he committed to weight loss.  On January 1, he weighed 278 pounds .  He has been walking 4 miles 5 days per week as well as some intermittent stationary bike.  He has lost a total of 32 pounds since January.  He continues to use CPAP with 100% compliance and since he has been on CPAP, he has not had any recurrent atrial fibrillation and his blood pressure has been less labile.  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  He has had failures to Crestor and Lipitor in the past due to significant myalgias, development, even with weekly dosing.  I resumed zetia   In August 2018 he noticed his heart rate increasing and he presented for evaluation and was seen by Clayton [name], PAC.  He admitted to a rare palpitation.  He denied any chest pain with exertion or dyspnea on exertion.  He has suffered a broken rib.  This year.  He has a weight goal of 220 pounds.    I saw him in September 2018, at which time he was doing well from a cardiovascular standpoint.   His father was ill and ultimately passed away in [place].  As result he was going back and forth to the [facility] area.  This resulted in  a change in his diet and ability to exercise.  I last saw him in October 2019 and his weight had increased from 240 back up to 270 pounds.  He was unaware of any episodes of atrial fibrillation.  He was using CPAP with 100% compliance along with his naps.  He was committed to begin weight loss again.    Since I saw him on October 19, 2018 he has had major lifestyle change.  He has been working with Clayton [name] in the healthy weight and wellness group.  Peak weight was 280 and most recent weight 244.  I saw him for a cardiology evaluation on August 09, 2018.  O ver the past 4 months, he had been under increased stress as result of his wife's illness.  He has been checking his blood pressure regularly and this has been stable  but on his blood pressure recording he has noticed the message of heart rate irregularity.  He is unaware of A. fib but has noticed this message for the last 3 to 4 months.  He is exercising every day and typically walks 2 miles in the morning and in the afternoon or evening does 30 minutes of stationary bike at least 5 to 6 days/week.  He continues to use CPAP.  I obtained a download in the office today from June 22 through August 08, 2018.  He is 100% compliant.  He has a ResMed air sense 10 auto unit with a pressure range of 8-20 with 95% pressure at 10.1 and maximum average and 11.5.  AHI is excellent at 1.2.     During his August 09, 2018 encounter his ECG demonstrated that he was back in atrial fibrillation and had a ventricular rate at 89 bpm.  There was LVH with repolarization changes.  At that time, I recommended discontinuance of amlodipine/benazepril combination and in its place started Cardizem CD 180 mg rather than amlodipine and olmesartan 20 mg rather than benazepril.  I initiated anticoagulation with Eliquis 5 mg twice a day.  He underwent a 2D echo Doppler study on August 16, 2018 which showed EF low normal at 50 to 55%.  There was evidence for significant LVH and moderate dilation of his left atrium.  I saw him for follow-up evaluation on August 23, 2018.  At that time he was now cognizant of his heart rate irregularity.  He has continued to use CPAP with excellent compliance.  A new download was obtained from July 5 through August 21, 2018 which shows 100% compliance with average usage 7 hours and 28 minutes.  AHI is 1.6 and his 95th percentile auto pressure is 10.1 with a maximum average pressure of 11.4.  He states he has lost weight over the past several weeks purposefully.  He continues to exercise now on a stationary bike.  He denies any chest pain PND orthopnea.  He was tolerating Eliquis without bleeding.  During that evaluation, we discussed different options including several additional  weeks of increased weight control versus initiating antiarrhythmic therapy.  Since his only other episode of atrial fibrillation previously occurred 13 years ago and he had not had any recurrence until this year he opted for an increased rate control trial.  As result metoprolol dose was further titrated.  I saw on September 15, 2018 for follow-up evaluation.  At that time remained in atrial fibrillation despite his increase metoprolol succinate to 100 mg in the morning and 50 mg at night with continuation of diltiazem 180 mg,  spironolactone 12.5 mg daily in addition to his olmesartan 20 mg.  I scheduled him for DC cardioversion but because of the need to obtain a COVID test I was unable to schedule this to be done by me prior to going on vacation.  He ultimately underwent the successful cardioversion by Clayton [name] successfully and received 1 shock of 200 J of biphasic synchronized rhythm with restoration of sinus rhythm.  Chemistry profile done prior to the cardioversion revealed his creatinine had increased to 1.69 and as result I recommended he reduce his olmesartan down to 10 mg from his dose of 20 mg.  I saw him on October 10, 2018 in follow-up of his cardioversion.  At that time he was maintaining sinus rhythm and feeling well.  He had more energy.  His stress level had significantly reduced since his wife had successful L5-S1 back surgery by Clayton [name].  He was continuing to use CPAP with 100% compliance.    I evaluated him on January 09, 2019.  At that time he stated that over the 3 months previous he had continued to feel well and remained asymptomatic.  At times there are still periods of increased stress.  He continued to use CPAP with 100% compliance.  A download was obtained from November 22 through January 08, 2019 which showed 100% compliance.  Average usage was 6 hours 47 minutes.  AHI is excellent at 1.4 with his AutoSet CPAP minimum pressure set at 8 and maximum of 20, with 95th percentile  pressure 10.2 with maximum average pressure 12.0.  During his evaluation, his ECG verify that he was back in atrial fibrillation.  At the time, he did not inform me that he had noted some irregularity but retrospectively this may have been going on for several weeks prior to that evaluation.  He was continuing to walk daily and exercising on his bike and was asymptomatic without shortness of breath.  During that evaluation I had a long discussion with him regarding possible EP evaluation for consideration of ablation.  After his significant discussion elected to initiate an attempt at antiarrhythmic therapy with low-dose flecainide initially at 50 mg twice a day with plans for office visit in 2 weeks.  He has continued to be on Eliquis for anticoagulation in addition to Zetia for his hyperlipidemia.  When I saw him on January 24, 2019 he stated that he had felt some fatigue once flecainide was instituted.  He was feeling well but this past Sunday he had been working very hard going up and down numerous steps carrying boxes with heavy exertion making at least 40 trips carrying Christmas girls back upstairs.  He was tired.  Sunday evening while sitting down he became disoriented anxious and it appeared that there was possibly some transient stress of aphasia.  He denied any focal weakness.  His symptoms ultimately resolved after 30 minutes.  He had called the office during the workweek and was concerned about his symptoms possibly secondary to flecainide.  He had self reduced his flecainide dose to just once a day rather than twice a day and is worked into my schedule today for follow-up evaluation.  Presently, he has no residual issues since that event several days ago.    At his January 2021 evaluation, I recommended he undergo a neurologic evaluation with Clayton [name] as well as discussed an EP evaluation for possible consideration of future atrial fibrillation ablation.  He saw Clayton [name] on February 08, 2019 and  was in  persistent atrial fibrillation.  Was discussion concerning continuing flecainide with subsequent high voltage cardioversion.  He saw Clayton [name] February 27, 2019 and after a long evaluation it was felt that his transient episode of word finding difficulty and anomia likely represented mild cognitive impairment rather than TIA or seizure.  He subsequently saw Dr Curt [name] back on March 05, 2019 and on 323 he underwent successful cardioversion with Dr. Oval Linsey which required 3 shocks with restoration of sinus rhythm.  He was evaluated by me in March 2021 at which time he felt his heart rhythm had remained stable.    He was under considerable increased stress to his wife had fallen in sustained a fracture to her humerus as well as tear to her rotator cuff and biceps tendon.  Ul diltiazem was discontinued and he was started on amlodipine 5 mg and was told to continue olmesartan 20 mg in addition to his metoprolol and spironolactone.  Timately she underwent surgery.  He denies chest pain PND orthopnea.  He has continued to use CPAP download from March 05, 2019 through April 03, 2019 continues to show excellent compliance with an AHI of 2.7 and 95th percentile pressure 11.4 cm.   Since I last saw him, he was evaluated by Clayton [name] June 20, 2019.  At that time, he was getting to stroke with his CPAP but seen for the first 10 minutes before he was able to settle down and fall asleep.  He denies chest pain or shortness of breath.  He had noted some elevated blood pressures as well as some dyspnea on exertion.  During that evaluation his diltiazem was discontinued and he was started on amlodipine 5 mg.  He was told to continue his present dose of olmesartan, metoprolol, spironolactone.  During that evaluation he was maintaining sinus rhythm but had bradycardia and first-degree heart block.  Flecainide was reduced down to 50 mg twice a day since his symptoms of fatigability and dyspnea seem to occur at the  higher dose.  With return to a lower dose his symptoms resolved.  I  evaluated him in June 2021 over the prior 3 months he denied any recurrent episodes of chest pain.    He continues to have issues in the first 10 minutes of his CPAP use which I suspect may be related to not having a ramp time on his machine which can easily be adjusted.  His blood pressure does continue to be somewhat labile but he states at home most of the time it is controlled.  He was unaware of any recurrent episodes of atrial fibrillation.  He denied presyncope or syncope.    He underwent a nuclear stress test which apparently had been ordered for dyspnea on exertion.  This was low risk study and demonstrated a hypertensive response to exercise.  A defect was felt to be present in the inferior wall which was interpreted as scar with mild peri-infarct ischemia, which may also have been exacerbated by his body habitus and diaphragmatic attenuation.  Currently he remains asymptomatic and denies chest pain or shortness of breath.  He has been evaluated by Clayton [name] for left cochlear Mnire's disease.  He underwent audiogram which showed mild to moderate severity high-frequency sensorineural neural hearing loss in the right ear and mild to moderately severe downsloping sensorineural neural hearing loss in the left ear.   I saw him in October 2021 at which time he denied any recurrent episodes of atrial fibrillation.  He  continued to  be on amlodipine 5 mg, metoprolol XL 100 mg in the evening with 25 or 50 mg in the morning, olmesartan 40 mg, and spironolactone 12.5 mg daily.  He was on Eliquis for anticoagulation.  He wastolerating Livalo 2 mg and Zetia for hyperlipidemia with LDL cholesterol at 55 in June 2021.  He tries to play golf several times a week if possible and continues to score well.  He continues to be followed by Dr. Dennard [name] with plans for continued weight loss.  I saw him in February 2022 and since his prior  evaluation he was having issues with his knees.   He had undergone removal of his meniscus.  He has been followed by Clayton [name] he recently had a hyaluronic acid injection with benefit.  He also has undergone evaluation with Clayton [name] with discomfort in his right hip.  He has low back issues at L5-S1 and is now on gabapentin.  He is unaware of recurrent atrial fibrillation.  Apparently he is was only taking flecainide 50 mg daily instead of twice a day.  He continued to be on amlodipine 7.5 mg, Eliquis 5 mg twice a day, metoprolol succinate 150 mg in the evening and spironolactone 12.5 mg daily.  At times he he has some mild blood pressure elevation.  He continued to be evaluated at healthy weight and wellness.  Because of his knee and hip issues he has not been playing as much golf as he had in the past.  He continuesd to use CPAP with 100% compliance. A download was obtained from January 20 through March 07, 2020.  He is averaging 7 hours and 2 minutes of CPAP use per night.  AHI is 1.5 with a 95th percentile pressure at 11.5 and maximum average pressure at 13.  During that evaluation, his QTc interval was prolonged at 497 and rather than further increase flecainide I recommended titration of metoprolol to 50 mg in the morning and 100 mg at night.  He continued to be on Livalo 2 mg daily and Zetia for hyperlipidemia.  I last saw him on July 17, 2020.  Since his prior evaluation he developed an episode of recurrent atrial fibrillation and was seen by Clayton Sims, NP on June 27, 2020 and was scheduled to see Clayton [name] on June 30, 2020.  During that evaluation, his flecainide was discontinued and amiodarone 400 mg twice a day was initiated for 2 weeks with plans to reduce to 400 mg for 2 weeks and then 200 mg daily.  He was scheduled to undergo DC cardioversion on July 14 2020 and upon arrival for the procedure he was found to have converted to sinus rhythm and cardioversion was not performed.  When seen  by Clayton [name], the patient had already a scheduled right hip replacement surgery to be done by Clayton [name] on August 12, 2020.  Clayton [name] has scheduled Mr. Leichter to undergo cardioversion in late August 2022.  He was using CPAP with 100% compliance with AHI of 1.7 and 95th percentile pressure 10.7 with maximum average pressure 12.7 centimeters of water.  He underwent CT cardiac morphology/pulmonary vein prior to his ablation.  Calcium score was 42.7 Aggrastat units which is the 25th percentile for age, race and sex matched control.  He had an upper normal ascending aorta size at 3.9 cm.  He underwent successful atrial fibrillation ablation with Clayton [name] on September 02, 2020.  He saw him in follow-up on December 16, 2020 and was maintaining sinus  rhythm and was taken off flecainide.  He continues Eliquis 5 mg twice a day.  He has been on amlodipine significant 5 mg daily, metoprolol succinate 50 mg twice a day, olmesartan 40 mg daily in addition to spironolactone 12.5 mg daily for hypertension.  He is on Livalo 2 mg with Zetia 10 mg for hyperlipidemia.  He continues to see Clayton [name] [employer] and wellness program.  He has resumed his activity and is walking 2 miles in the morning and riding a stationary bike for 30 minutes later in the afternoon.  He presents for evaluation.   Past Medical History:  Diagnosis Date   Arthritis    hips    Atrial fibrillation (HCC)    Cataract    Cochlear Meniere syndrome of left ear 06/09/2015   CPAP (continuous positive airway pressure) dependence    Dysrhythmia    PAF- hx of 7 years ago    HLD (hyperlipidemia)    Hypertension 05/29/2010   ECHO-EF 67% wnl   Meniere disease    Obesity    Palpitations    Sleep apnea 09/14/2004   [facility]- Dr. Humphrey Rolls; CPAP titration 11/12/04- Dr. Humphrey Rolls    Past Surgical History:  Procedure Laterality Date   ATRIAL FIBRILLATION ABLATION N/A 09/10/2020   Procedure: ATRIAL FIBRILLATION ABLATION;   Surgeon: Constance Haw, MD;  Location: [facility];  Service: Cardiovascular;  Laterality: N/A;   CARDIAC CATHETERIZATION  11/03/2005   CARDIOVERSION N/A 09/26/2018   Procedure: CARDIOVERSION;  Surgeon: Geralynn Rile, MD;  Location: [facility];  Service: Endoscopy;  Laterality: N/A;   CARDIOVERSION N/A 03/13/2019   Procedure: CARDIOVERSION;  Surgeon: Skeet Latch, MD;  Location: [facility];  Service: Cardiovascular;  Laterality: N/A;   CATARACT EXTRACTION  01/19/2012   KNEE ARTHROSCOPY     left knee- 2002    TOTAL HIP ARTHROPLASTY  06/08/2011   Procedure: TOTAL HIP ARTHROPLASTY ANTERIOR APPROACH;  Surgeon: [name], MD;  Location: [facility];  Service: Orthopedics;  Laterality: Left;   TOTAL HIP ARTHROPLASTY Right 08/12/2020   ANTERIOR APPROACH   TOTAL HIP ARTHROPLASTY Right 08/12/2020   Procedure: TOTAL HIP ARTHROPLASTY ANTERIOR APPROACH;  Surgeon: Paralee Cancel, MD;  Location: [facility];  Service: Orthopedics;  Laterality: Right;    No Known Allergies   Current Outpatient Medications  Medication Sig Dispense Refill   amLODipine (NORVASC) 5 MG tablet TAKE 1.5 TABLETS BY MOUTH DAILY. 135 tablet 2   Coenzyme Q10 200 MG capsule Take 200 mg by mouth in the morning.     ELIQUIS 5 MG TABS tablet TAKE 1 TABLET BY MOUTH TWICE A DAY 180 tablet 1   ezetimibe (ZETIA) 10 MG tablet Take 1 tablet (10 mg total) by mouth in the morning. 90 tablet 3   metFORMIN (GLUCOPHAGE) 500 MG tablet Take 1 tablet (500 mg total) by mouth daily with breakfast. 90 tablet 0   metoprolol succinate (TOPROL-XL) 50 MG 24 hr tablet Take 50 mg by mouth 2 (two) times daily. Take twice a day.     olmesartan (BENICAR) 40 MG tablet TAKE 1 TABLET BY MOUTH EVERY DAY 90 tablet 3   Pitavastatin Calcium (LIVALO) 2 MG TABS Take 1 tablet (2 mg total) by mouth every evening. 90 tablet 3   spironolactone (ALDACTONE) 25 MG tablet TAKE 1/2 TABLET BY MOUTH EVERY DAY 45 tablet 3   tamsulosin (FLOMAX) 0.4 MG  CAPS capsule Take 0.4 mg by mouth in the morning.  No current facility-administered medications for this visit.    Socially he is remarried after his first wife passed away. He has 2 children. He is retired  and was the Building control surveyor at [employer]. there is no tobacco history.  He plays golf at least 2 days/week and this past year not only did he have a hole in 1 but he has also shot his age.  ROS General: Negative; No fevers, chills, or night sweats;  HEENT: Negative; No changes in vision or hearing, sinus congestion, difficulty swallowing Pulmonary: Negative; No cough, wheezing, shortness of breath, hemoptysis Cardiovascular: See HPI GI: Negative; No nausea, vomiting, diarrhea, or abdominal pain GU: Negative; No dysuria, hematuria, or difficulty voiding Musculoskeletal: He is status post rib fracture.  He is status post hip replacement. Hematologic/Oncology: Negative; no easy bruising, bleeding Endocrine: Negative; no heat/cold intolerance; no diabetes Neuro: No vertigo or imbalance issues Skin: Negative; No rashes or skin lesions Psychiatric: Negative; No behavioral problems, depression Sleep: Positive for obstructive sleep apnea on CPAP therapy with 100% compliance No snoring, daytime sleepiness, hypersomnolence, bruxism, restless legs, hypnogognic hallucinations, no cataplexy Other comprehensive 14 point system review is negative.  PE BP (!) 168/91    Pulse 76    Ht _0  (1.905 m)    Wt 261 lb 9.6 oz (118.7 kg)    SpO2 98%    BMI 32.70 kg/m    Repeat blood pressure by me was 130/84.  He states his blood pressure this morning at home was 123/75  Wt Readings from Last 3 Encounters:  01/22/21 261 lb 9.6 oz (118.7 kg)  01/21/21 263 lb (119.3 kg)  12/29/20 255 lb (115.7 kg)  In 2021 he had lost approximately 40 pounds  General: Alert, oriented, no distress.  Skin: normal turgor, no rashes, warm and dry HEENT: Normocephalic, atraumatic. Pupils equal round and reactive  to light; sclera anicteric; extraocular muscles intact;  Nose without nasal septal hypertrophy Mouth/Parynx benign; Mallinpatti scale 3 Neck: No JVD, no carotid bruits; normal carotid upstroke Lungs: clear to ausculatation and percussion; no wheezing or rales Chest wall: without tenderness to palpitation Heart: PMI not displaced, RRR, s1 s2 normal, 1/6 systolic murmur, no diastolic murmur, no rubs, gallops, thrills, or heaves Abdomen: soft, nontender; no hepatosplenomehaly, BS+; abdominal aorta nontender and not dilated by palpation. Back: no CVA tenderness Pulses 2+ Musculoskeletal: full range of motion, normal strength, no joint deformities Extremities: no clubbing cyanosis or edema, Homan's sign negative  Neurologic: grossly nonfocal; Cranial nerves grossly wnl Psychologic: Normal mood and affect    January 22, 2021 ECG (independently read by me):  NSR at 76, RBBB: PR 200 msec  July 17, 2020 ECG (independently read by me): NSR at 70; RBBB, 1st degree AV block; PR 220 msec; QTc 492 msec  March 10, 2020 ECG (independently read by me): NSR at 82; RBBB with repoarization, QTc 497 msec  November 06, 2019 ECG (independently read by me): Sinus rhythm at 64; 1st degree AV block; PR 212 msec;RBBB  July 09, 2019 ECG (independently read by me): Normal sinus rhythm at 70 bpm, first-degree AV block with a.  PR interval at 212 ms.  Right bundle branch block with repolarization changes.  Borderline LVH by voltage criteria.  QTc interval 470 ms.  April 04, 2019 ECG (independently read by me): Sinus bradycardia 59 bpm, first-degree AV block with a PR of 248 ms.  LVH.  Inferior Q waves, unchanged  January 24, 2019 ECG (independently read by me): Atrial fibrillation at  83 bpm.  Borderline LVH by voltage criteria in aVL; QTc interval 446 ms.  January 09, 2019 ECG (independently read by me): Atrial fibrillation ar 97, RBBB; Left axis deviation; Inferior Q waves, unchanged. QTc 487    August 09, 2018 ECG  (independently read by me): Normal sinus rhythm at 72 bpm.  Left Axis deviation.  QTc interval 429 ms.  September 15, 2018 ECG (independently read by me): Atrial fibrillation at 92 bpm.  Right bundle branch block with repolarization changes.  August 23, 2018 ECG (independently read by me): Atrial fibrillation at 96 bpm.  LVH with repolarization changes.  Left axis deviation.  Left anterior hemiblock.  Inferior Q waves  August 09, 2018 ECG (independently read by me):Atrial fibrillatoion at 75; LVH with repolarization  October 219 ECG (independently read by me): Normal sinus rhythm at 81 bpm.  Nonspecific intraventricular conduction delay.  Left axis deviation  September 2018 ECG (independently read by me): Normal sinus rhythm at 63 bpm.  Left bundle branch block with repolarization changes.  Normal intervals.  No ectopy.  April 2018 ECG (independently read by me): Normal sinus rhythm at 68 bpm.  LVH with repolarization.  Normal intervals.  November 2017 ECG (independently read by me): Normal sinus rhythm at 78 bpm.  Nonspecific interventricular delay.  QRS complex V1 V2.  August 2016 ECG (independently read by me): Normal sinus rhythm at 77 bpm.  LVH with repolarization changes.  Left axis deviation.  December 2015 ECG (independently read by me): Normal sinus rhythm at 87 bpm.  LVH by voltage criteria with T-wave changes in leads 1 and aVL.  July 2014 ECG: Normal sinus rhythm at 76 beats per minute. LVH with QRS widening.  LABS:  BMP Latest Ref Rng & Units 08/13/2020 08/12/2020 08/11/2020  Glucose 70 - 99 mg/dL 211(H) 121(H) 106(H)  BUN 8 - 23 mg/dL 26(H) 30(H) 26  Creatinine 0.61 - 1.24 mg/dL 1.45(H) 1.52(H) 1.52(H)  BUN/Creat Ratio 10 - 24 - - 17  Sodium 135 - 145 mmol/L 137 137 140  Potassium 3.5 - 5.1 mmol/L 4.3 4.5 4.9  Chloride 98 - 111 mmol/L 107 109 108(H)  CO2 22 - 32 mmol/L _0 Calcium 8.9 - 10.3 mg/dL 9.4 9.1 10.3(H)   Hepatic Function Latest Ref Rng & Units 08/12/2020  08/01/2020 01/24/2020  Total Protein 6.5 - 8.1 g/dL 5.6(L) 6.6 7.3  Albumin 3.5 - 5.0 g/dL 3.5 4.6 4.5  AST 15 - 41 U/L _1 ALT 0 - 44 U/L _2 Alk Phosphatase 38 - 126 U/L 44 69 63  Total Bilirubin 0.3 - 1.2 mg/dL 0.8 1.3(H) 0.6   CBC Latest Ref Rng & Units 08/13/2020 08/12/2020 08/01/2020  WBC 4.0 - 10.5 K/uL 9.9 5.5 5.7  Hemoglobin 13.0 - 17.0 g/dL 12.7(L) 12.0(L) 14.7  Hematocrit 39.0 - 52.0 % 37.5(L) 36.4(L) 44.1  Platelets 150 - 400 K/uL 179 161 217   Lab Results  Component Value Date   MCV 89.9 08/13/2020   MCV 92.9 08/12/2020   MCV 90 08/01/2020   Lab Results  Component Value Date   TSH 2.140 08/01/2020    Lab Results  Component Value Date   HGBA1C 5.8 (H) 07/04/2020    Lipid Panel     Component Value Date/Time   CHOL 120 08/01/2020 1031   TRIG 81 08/01/2020 1031   HDL 54 08/01/2020 1031   CHOLHDL 2.2 08/01/2020 1031   CHOLHDL 3.8 10/12/2016 0902   VLDL 17  04/14/2016 0937   LDLCALC 50 08/01/2020 1031   LDLCALC 119 (H) 10/12/2016 0902     RADIOLOGY: No results found.  IMPRESSION:  1. Paroxysmal atrial fibrillation [facility]): Status post ablation August 2022   2. Anticoagulated   3. Primary hypertension   4. OSA on CPAP   5. Mild obesity   6. Mixed hyperlipidemia   7. Pre-diabetes     ASSESSMENT AND PLAN: Mr Geiman is a 76 year old gentleman who has a long-standing history of hypertension and remotely had been on amlodipine/benazepril 10/40, HCTZ 25 mg, Toprol-XL 100 mg in addition to spironolactone 12.5 mg daily.  He had had a prior isolated episode of atrial fibrillation in 2007 and had undergone cardioversion.  On this regimen his blood pressure had stabilized and consistently was well controlled and he had not had any recurrent episodes of atrial fibrillation.   He had gained significant amount of weight since August 2019.  He initiated a major lifestyle change and has been working closely with Clayton [name] from [facility]  resulting in a greater than 40 pound weight loss in 2021.   When I saw him in late July 2020 he was in atrial fibrillation of questionable duration.  At the time retrospectively he believes he may have been in A. fib for 3 to 4 months duration.  I gradually titrated his medical regimen and changed his benazepril to diltiazem and olmesartan to take in addition to metoprolol succinate and his spironolactone 12.5 mg daily.  He had continued to use CPAP with 100% compliance.  His only previous episode of A. fib prior to this episode had occurred in 2007 and he had maintained sinus rhythm until his recurrent episode.  He underwent successful cardioversion on September 26, 2018 with restoration of sinus rhythm.  When I saw him on January 09, 2019 he was continuing to have periods of increased stress ECG again showed recurrent atrial fibrillation of questionable duration.  Retrospectively he felt he may have been in atrial fibrillation for 3 to 4 weeks duration prior to that visit.  He also had an episode of disorientation with possible transient expressive aphasia and underwent neurologic evaluation with Clayton [name] and also EP evaluation with Clayton [name].  He was felt that his word finding difficulty and anomia most likely represented mild cognitive impairment rather than a TIA or stroke.  He had been on increased dose of flecainide up to 100 mg twice a day after his subsequent cardioversion.  His last echo Doppler study in January 2021 showed an EF of 50 to 55% without regional wall motion abnormalities.  When I last saw him in February 2022 he was asymptomatic without chest pain or shortness of breath and was maintaining sinus rhythm.  He has subsequently reverted back to atrial fibrillation and his flecainide dose was discontinued and he was started on amiodarone initially 400 mg twice a day when seen by Clayton [name] on June 30, 2020.  He had pharmacologically converted to sinus rhythm when he presented for outpatient DC  cardioversion on February 14, 2020.  Since my last evaluation in June 2022, he underwent right successful hip replacement surgery by Clayton [name] and also successful atrial fibrillation ablation by Clayton [name].  He is maintaining sinus rhythm.  He is now off flecainide and has been off amiodarone.  He states his blood pressure at home typically has been stable and this morning was 123/75, although upon presentation to our office today it was 168/91.  Repeat evaluation by me was 130/84.  I have recommended he monitor his blood pressure at home.  He has been on a regimen presently with amlodipine 7.5 mg in addition to metoprolol succinate 50 mg twice a day, olmesartan 40 mg and spironolactone 12.5 mg daily.  If blood pressure increases he will titrate amlodipine to 10 mg daily.  He is now back exercising following his hip replacement.  He is walking 2 miles per day and has been riding a bicycle at least 30 minutes later in the afternoon without chest pain development or awareness of recurrent arrhythmia.  He brought with him his list of 11 steps which he plans to follow over the next decade for healthy life which I have reviewed.  He continues to be on Zetia and Livalo 2 mg for hyperlipidemia.  He continues to be on CPAP.  I obtained a download from December 5 through January 20, 2018.  Which continue to show excellent compliance with average use of 7 hours and 31 minutes.  AHI is 1.5 and his 95th percentile pressure was 10.9 with maximum average pressure 12.2..  AHI is excellent at 1.7 with his 95th percentile pressure at 10.7 with maximum average pressure 12.7.  He has a follow-up appointment to see Clayton [name] in March 17 and I will see him in 6 months for cardiology evaluation.   [name], MD, [facility]  01/27/2021 3:03 PM

## 2021-01-22 NOTE — Patient Instructions (Signed)
Medication Instructions:  The current medical regimen is effective;  continue present plan and medications as directed. Please refer to the Current Medication list given to you today.   *If you need a refill on your cardiac medications before your next appointment, please call your pharmacy*  Lab Work:   Testing/Procedures:  NONE    NONE  Special Instructions TAKE AND LOG YOUR BLOOD PRESSURE; CALL IF THE TOP NUMBER IS IN THE HIGH 130's OR GREATER  Follow-Up: Your next appointment:  6 month(s) In Person with Shelva Majestic, MD   Please call our office 2 months in advance to schedule this appointment  At [facility], you and your health needs are our priority.  As part of our continuing mission to provide you with exceptional heart care, we have created designated Provider Care Teams.  These Care Teams include your primary Cardiologist (physician) and Advanced Practice Providers (APPs -  Physician Assistants and Nurse Practitioners) who all work together to provide you with the care you need, when you need it.

## 2021-01-25 ENCOUNTER — Encounter: Payer: Self-pay | Admitting: Family Medicine

## 2021-01-27 ENCOUNTER — Encounter: Payer: Self-pay | Admitting: Cardiovascular Disease

## 2021-01-27 ENCOUNTER — Ambulatory Visit (INDEPENDENT_AMBULATORY_CARE_PROVIDER_SITE_OTHER): Payer: Medicare PPO | Admitting: Psychologist

## 2021-01-27 ENCOUNTER — Other Ambulatory Visit: Payer: Self-pay

## 2021-01-27 DIAGNOSIS — F411 Generalized anxiety disorder: Secondary | ICD-10-CM | POA: Diagnosis not present

## 2021-01-27 NOTE — Progress Notes (Signed)
[facility] Counselor/Therapist Progress Note  Patient ID: Clayton [name], MRN: [id number],    Date: 01/27/2021  Time Spent: 9:04 am to 9:35 am. Total time spent 31 minutes.    This session was held via in person. The patient consented to in-person therapy and was in the clinician's office. Limits of confidentiality were discussed with the patient.   Treatment Type: Individual Therapy  Reported Symptoms: Patient described himself as doing much better. Mental Status Exam: Appearance:  Well Groomed     Behavior: Appropriate  Motor: Normal  Speech/Language:  Normal Rate  Affect: Appropriate  Mood: normal  Thought process: normal  Thought content:   WNL  Sensory/Perceptual disturbances:   WNL  Orientation: oriented to person  Attention: Good  Concentration: Good  Memory: WNL  Fund of knowledge:  Good  Insight:   Fair  Judgment:  Good  Impulse Control: Good   Risk Assessment: Danger to Self:  No Self-injurious Behavior: No Danger to Others: No Duty to Warn:no Physical Aggression / Violence:No  Access to Firearms a concern: No  Gang Involvement:No   Subjective:  The patient described himself as doing well while reflecting on events since the last session. He processed thoughts related to the holidays. He also indicated that he feels as though he is close to where he was two years ago physically. He spent the session reflecting on mindfulness and using the blood pressure cuff at home. He identified with the theme of control and processed thoughts associated with that concept. Patient was open to the resources discussed. He asked to follow up and was agreeable to the homework assignment. He denied suicidal and homicidal ideation.   Interventions: Worked on developing a therapeutic relationship with the patient using active listening and reflective statements. Provided emotional support using empathy and validation. Reviewed events since the last session. Praised  patient for doing better and explored what has assisted the patient. Congratulated patient on positive news from cardiologist. Explored challenges patient experiences with mindfulness and blood pressure cuff. Identified the theme of control. Used socratic questions to assist the patient gain insight into self. Provided psychoeducation about Living with the Enemy by Jonna Clark. Processed steps patient can take to give up control. Normalized experience with mindfulness.   Assigned homework. Assessed for suicidal and homicidal ideation  Homework: Patient will read Living with the Enemy and use packet associated with book  Next Session: Emotional support and review homework  Diagnosis: F41.1 generalized anxiety disorder.   Plan:   Client Abilities: Friendly and easy to develop rapport  Client Preferences: ACT and CBT  Client statement of Needs: Coping skills  Treatment Level: Outpatient   Goals Reduce overall frequency, intensity, and duration of anxiety Stabilize anxiety level wile increasing ability to function Enhance ability to effectively cope with full variety of stressors Learn and implement coping skills that result in a reduction of anxiety   Objectives target date for all objectives is 09/24/2021 Verbalize an understanding of the cognitive, physiological, and behavioral components of anxiety Learning and implement calming skills to reduce overall anxiety Verbalize an understanding of the role that cognitive biases play in excessive irrational worry and persistent anxiety symptoms Identify, challenge, and replace based fearful talk Learn and implement problem solving strategies Identify and engage in pleasant activities Learning and implement personal and interpersonal skills to reduce anxiety and improve interpersonal relationships Learn to accept limitations in life and commit to tolerating, rather than avoiding, unpleasant emotions while accomplishing meaningful goals Identify  major  life conflicts from the past and present that form the basis for present anxiety Maintain involvement in work, family, and social activities Reestablish a consistent sleep-wake cycle Cooperate with a medical evaluation  Interventions Engage the patient in behavioral activation Use instruction, modeling, and role-playing to build the client's general social, communication, and/or conflict resolution skills Use Acceptance and Commitment Therapy to help client accept uncomfortable realities in order to accomplish value-consistent goals Reinforce the client's insight into the role of his/her past emotional pain and present anxiety  Support the client in following through with work, family, and social activities Teach and implement sleep hygiene practices  Refer the patient to a physician for a psychotropic medication consultation Monito the clint's psychotropic medication compliance Discuss how anxiety typically involves excessive worry, various bodily expressions of tension, and avoidance of what is threatening that interact to maintain the problem  Teach the patient relaxation skills Assign the patient homework Discuss examples demonstrating that unrealistic worry overestimates the probability of threats and underestimates patient's ability  Assist the patient in analyzing his or her worries Help patient understand that avoidance is reinforcing   The patient reviewed the treatment plan on 10/08/2020. The patient approved of the treatment plan.    Conception Chancy, PsyD

## 2021-02-04 ENCOUNTER — Encounter: Payer: Self-pay | Admitting: Cardiovascular Disease

## 2021-02-06 NOTE — Telephone Encounter (Signed)
Spoke to pt. Pt reports that he is confident he having afib again and so disappointed if so. Pt aware Dr. Curt Bears recommends he follow up in the AFib clinic/EP APP at next available. Aware office will call to arrange OV  Patient verbalized understanding and agreeable to plan.

## 2021-02-09 ENCOUNTER — Encounter (INDEPENDENT_AMBULATORY_CARE_PROVIDER_SITE_OTHER): Payer: Self-pay | Admitting: Family Medicine

## 2021-02-09 ENCOUNTER — Other Ambulatory Visit: Payer: Self-pay

## 2021-02-09 ENCOUNTER — Ambulatory Visit (INDEPENDENT_AMBULATORY_CARE_PROVIDER_SITE_OTHER): Payer: Medicare PPO | Admitting: Family Medicine

## 2021-02-09 VITALS — BP 106/73 | HR 60 | Temp 98.0°F | Ht 75.0 in | Wt 250.0 lb

## 2021-02-09 DIAGNOSIS — Z6831 Body mass index (BMI) 31.0-31.9, adult: Secondary | ICD-10-CM

## 2021-02-09 DIAGNOSIS — E669 Obesity, unspecified: Secondary | ICD-10-CM

## 2021-02-09 DIAGNOSIS — I10 Essential (primary) hypertension: Secondary | ICD-10-CM

## 2021-02-09 DIAGNOSIS — I480 Paroxysmal atrial fibrillation: Secondary | ICD-10-CM | POA: Insufficient documentation

## 2021-02-09 DIAGNOSIS — I4891 Unspecified atrial fibrillation: Secondary | ICD-10-CM | POA: Diagnosis not present

## 2021-02-09 NOTE — Progress Notes (Signed)
Chief Complaint:   OBESITY Clayton [name] is here to discuss his progress with his obesity treatment plan along with follow-up of his obesity related diagnoses. Clayton [name] is on the Category 2 Plan and states he is following his eating plan approximately 90% of the time. Clayton [name] states he is riding the stationary bike for 30-40 minutes 5-7 times per week.  Today's visit was #: 58 Starting weight: 266 lbs Starting date: 11/01/2017 Today's weight: 250 lbs Today's date: 02/09/2021 Total lbs lost to date: 16 Total lbs lost since last in-office visit: 5  Interim History: Clayton [name] did some celebration over the holidays, but he has already gotten back on track. He is back to exercising desoite feeling his afib has returned. He has an appointment with Cardiology this week.  Subjective:   1. Atrial fibrillation, unspecified type ([place]) Clayton [name] is on Eliquis and metoprolol, and he is status post ablation. He does not note palpitations, but instead he feels "heavy headed" and notes some increased fatigue.  2. Essential hypertension Clayton [name]'s blood pressure is well controlled today. He has no change in medications. He is working on anxiety with his therapist, as well as working on diet and exercise.   Assessment/Plan:   1. Atrial fibrillation, unspecified type (Clayton [name]) Clayton [name] is rate controlled and he has the appropriate medications. He sees Cardiology this week. He will continue healthy eating and exercise in the meanwhile.  2. Essential hypertension Clayton [name] will continue his diet, exercise, and medications as is, and we will continue to follow as he continues his lifestyle modifications.  3. Obesity, current BMI 31.2 Clayton [name] is currently in the action stage of change. As such, his goal is to continue with weight loss efforts. He has agreed to the Category 2 Plan.   Exercise goals: As is.  Behavioral modification strategies: increasing water intake and no skipping meals.  Clayton [name] has agreed to follow-up  with our clinic in 5 to 6 weeks. He was informed of the importance of frequent follow-up visits to maximize his success with intensive lifestyle modifications for his multiple health conditions.   Objective:   Blood pressure 106/73, pulse 60, temperature 98 F (36.7 C), height 6\' 3"  (1.905 m), weight 250 lb (113.4 kg), SpO2 97 %. Body mass index is 31.25 kg/m.  General: Cooperative, alert, well developed, in no acute distress. HEENT: Conjunctivae and lids unremarkable. Cardiovascular: Regular rhythm.  Lungs: Normal work of breathing. Neurologic: No focal deficits.   Lab Results  Component Value Date   CREATININE 1.45 (H) 08/13/2020   BUN 26 (H) 08/13/2020   NA 137 08/13/2020   K 4.3 08/13/2020   CL 107 08/13/2020   CO2 23 08/13/2020   Lab Results  Component Value Date   ALT 27 08/12/2020   AST 23 08/12/2020   ALKPHOS 44 08/12/2020   BILITOT 0.8 08/12/2020   Lab Results  Component Value Date   HGBA1C 5.8 (H) 07/04/2020   HGBA1C 5.8 (H) 08/01/2019   HGBA1C 5.8 (H) 09/04/2018   HGBA1C 6.0 (H) 11/01/2017   HGBA1C 5.4 04/14/2016   Lab Results  Component Value Date   INSULIN 3.9 08/01/2019   INSULIN 9.0 09/04/2018   INSULIN 22.4 11/01/2017   Lab Results  Component Value Date   TSH 2.140 08/01/2020   Lab Results  Component Value Date   CHOL 120 08/01/2020   HDL 54 08/01/2020   LDLCALC 50 08/01/2020   TRIG 81 08/01/2020   CHOLHDL 2.2 08/01/2020   Lab Results  Component Value Date  VD25OH 39.95 01/24/2020   VD25OH 37.6 08/01/2019   VD25OH 43.2 09/04/2018   Lab Results  Component Value Date   WBC 9.9 08/13/2020   HGB 12.7 (L) 08/13/2020   HCT 37.5 (L) 08/13/2020   MCV 89.9 08/13/2020   PLT 179 08/13/2020   Lab Results  Component Value Date   IRON 71 01/24/2020   FERRITIN 77.1 01/24/2020   Attestation Statements:   Reviewed by clinician on day of visit: allergies, medications, problem list, medical history, surgical history, family history, social  history, and previous encounter notes.  Time spent on visit including pre-visit chart review and post-visit care and charting was 32 minutes.    I, Trixie Dredge, am acting as transcriptionist for Dennard Nip, MD.  I have reviewed the above documentation for accuracy and completeness, and I agree with the above. -  Dennard Nip, MD

## 2021-02-13 ENCOUNTER — Other Ambulatory Visit: Payer: Self-pay

## 2021-02-13 ENCOUNTER — Ambulatory Visit: Payer: Medicare PPO | Admitting: Physician Assistant

## 2021-02-13 ENCOUNTER — Encounter: Payer: Self-pay | Admitting: *Deleted

## 2021-02-13 ENCOUNTER — Encounter: Payer: Self-pay | Admitting: Physician Assistant

## 2021-02-13 VITALS — BP 132/82 | HR 69 | Ht 75.0 in | Wt 256.6 lb

## 2021-02-13 DIAGNOSIS — G4733 Obstructive sleep apnea (adult) (pediatric): Secondary | ICD-10-CM

## 2021-02-13 DIAGNOSIS — I10 Essential (primary) hypertension: Secondary | ICD-10-CM | POA: Diagnosis not present

## 2021-02-13 DIAGNOSIS — I480 Paroxysmal atrial fibrillation: Secondary | ICD-10-CM

## 2021-02-13 NOTE — Patient Instructions (Addendum)
Medication Instructions:   Your physician recommends that you continue on your current medications as directed. Please refer to the Current Medication list given to you today.   *If you need a refill on your cardiac medications before your next appointment, please call your pharmacy*   Lab Work:   [place]   If you have labs (blood work) drawn today and your tests are completely normal, you will receive your results only by: [place] (if you have MyChart) OR A paper copy in the mail If you have any lab test that is abnormal or we need to change your treatment, we will call you to review the results.   Testing/Procedures: SEE LETTER FOR 03-03-21 Your physician has recommended that you have a Cardioversion (DCCV). Electrical Cardioversion uses a jolt of electricity to your heart either through paddles or wired patches attached to your chest. This is a controlled, usually prescheduled, procedure. Defibrillation is done under light anesthesia in the hospital, and you usually go home the day of the procedure. This is done to get your heart back into a normal rhythm. You are not awake for the procedure. Please see the instruction sheet given to you today.     Follow-Up: At [facility], you and your health needs are our priority.  As part of our continuing mission to provide you with exceptional heart care, we have created designated Provider Care Teams.  These Care Teams include your primary Cardiologist (physician) and Advanced Practice Providers (APPs -  Physician Assistants and Nurse Practitioners) who all work together to provide you with the care you need, when you need it.  We recommend signing up for the patient portal called "MyChart".  Sign up information is provided on this After Visit Summary.  MyChart is used to connect with patients for Virtual Visits (Telemedicine).  Patients are able to view lab/test results, encounter notes, upcoming appointments, etc.  Non-urgent  messages can be sent to your provider as well.   To learn more about what you can do with MyChart, go to [email or web address].    Your next appointment:   2 month(s)  The format for your next appointment:   In Person  Provider:   Allegra Lai, MD    Other Instructions

## 2021-02-13 NOTE — H&P (View-Only) (Signed)
Cardiology Office Note Date:  02/13/2021  Patient ID:  Clayton, [name] Oct 05, 1945, MRN [id number] PCP:  Ma Hillock, DO  Cardiologist:  Dr. Claiborne Billings Electrophysiologist: Dr. Curt Bears    Chief Complaint: recurrent AFib  History of Present Illness: Clayton [name] is a 76 y.o. male with history of HTN, OSA, Afib  He comes in today to be seen for Dr. Curt Bears, last seen by him 11.29/22, post ablation visit, had not to then had further Afib, was doing well walking regularly, playing golf, and riding stationary bike, working on weight loss  He saw dr. Claiborne Billings 01/22/21, discussed stress myoview, with no CP suspected defect was more likely attenuation. Was doing well, continued to have success with his exercise and weight loss journey. Was very compliant with CPAP Discussed strategy to increase amlodipine if BP is elevated Planned to see him in 85mo   Pt called/sent chart message to report his BP, discussed exercising and feeling well since his visit with Dr. Claiborne Billings, though developed some lightheadedness after exercise and his BP machine suggests he is back in Afib and very disappointed.  TODAY He is accompanied by his wife All in all he is doing really well He is very invested in his health both physical and mental health and very compliant with his medicines and CPAP  He does not have overt palpitations in Afib, but does not feel as well as he does in SR either 1.He notes a slight chatter to his teeth 2. Feels heavy headed 3. Reduced  exertional capacity, fatigues easier with his usual exercise and activities  He brings his home BPs and HRs, all are very good  His preference would be less medicines if possible, while he is not against medicines, does tend to feel like they contribute to some general vague sense of lightheadedness (a day in day out feeling eben in SR) He would be very open to the idea of another ablation procedure if felt reasonable.   Afib hx Diagnosed as far  back as 2007 Flecainide failed to maintain SR stopped June 2022 Amiodarone started June 2022, stopped Aug 2022, ?blurred vision, felt awful on it PVI ablation 09/02/20  Past Medical History:  Diagnosis Date   Arthritis    hips    Atrial fibrillation (HCC)    Cataract    Cochlear Meniere syndrome of left ear 06/09/2015   CPAP (continuous positive airway pressure) dependence    Dysrhythmia    PAF- hx of 7 years ago    HLD (hyperlipidemia)    Hypertension 05/29/2010   ECHO-EF 67% wnl   Meniere disease    Obesity    Palpitations    Sleep apnea 09/14/2004   [facility]- Dr. Humphrey Rolls; CPAP titration 11/12/04- Dr. Humphrey Rolls    Past Surgical History:  Procedure Laterality Date   ATRIAL FIBRILLATION ABLATION N/A 09/10/2020   Procedure: [facility];  Surgeon: Constance Haw, MD;  Location: [place] [facility];  Service: Cardiovascular;  Laterality: N/A;   CARDIAC CATHETERIZATION  11/03/2005   CARDIOVERSION N/A 09/26/2018   Procedure: CARDIOVERSION;  Surgeon: Geralynn Rile, MD;  Location: [place];  Service: Endoscopy;  Laterality: N/A;   CARDIOVERSION N/A 03/13/2019   Procedure: CARDIOVERSION;  Surgeon: Skeet Latch, MD;  Location: [place];  Service: Cardiovascular;  Laterality: N/A;   CATARACT EXTRACTION  01/19/2012   KNEE ARTHROSCOPY     left knee- 2002    TOTAL HIP ARTHROPLASTY  06/08/2011   Procedure: TOTAL HIP  ARTHROPLASTY ANTERIOR APPROACH;  Surgeon: [name], MD;  Location: [facility];  Service: Orthopedics;  Laterality: Left;   TOTAL HIP ARTHROPLASTY Right 08/12/2020   ANTERIOR APPROACH   TOTAL HIP ARTHROPLASTY Right 08/12/2020   Procedure: TOTAL HIP ARTHROPLASTY ANTERIOR APPROACH;  Surgeon: Paralee Cancel, MD;  Location: [facility];  Service: Orthopedics;  Laterality: Right;    Current Outpatient Medications  Medication Sig Dispense Refill   amLODipine (NORVASC) 5 MG tablet TAKE 1.5 TABLETS BY MOUTH DAILY. 135 tablet 2    Coenzyme Q10 200 MG capsule Take 200 mg by mouth in the morning.     ELIQUIS 5 MG TABS tablet TAKE 1 TABLET BY MOUTH TWICE A DAY 180 tablet 1   ezetimibe (ZETIA) 10 MG tablet Take 1 tablet (10 mg total) by mouth in the morning. 90 tablet 3   metFORMIN (GLUCOPHAGE) 500 MG tablet Take 1 tablet (500 mg total) by mouth daily with breakfast. 90 tablet 0   metoprolol succinate (TOPROL-XL) 50 MG 24 hr tablet Take 50 mg by mouth 2 (two) times daily. Take twice a day.     olmesartan (BENICAR) 40 MG tablet TAKE 1 TABLET BY MOUTH EVERY DAY 90 tablet 3   Pitavastatin Calcium (LIVALO) 2 MG TABS Take 1 tablet (2 mg total) by mouth every evening. 90 tablet 3   spironolactone (ALDACTONE) 25 MG tablet TAKE 1/2 TABLET BY MOUTH EVERY DAY 45 tablet 3   tamsulosin (FLOMAX) 0.4 MG CAPS capsule Take 0.4 mg by mouth in the morning.     No current facility-administered medications for this visit.    Allergies:   Patient has no known allergies.   Social History:  The patient  reports that he has never smoked. He has never used smokeless tobacco. He reports that he does not drink alcohol and does not use drugs.   Family History:  The patient's family history includes Arthritis in his father; Diabetes in his maternal grandfather, mother, and sister; Heart disease in his father, maternal grandfather, and paternal grandfather; Hyperlipidemia in his mother; Hypertension in his maternal grandfather and mother; Obesity in his mother; Stroke in his maternal grandmother and mother.  ROS:  Please see the history of present illness.    All other systems are reviewed and otherwise negative.   PHYSICAL EXAM:  VS:  There were no vitals taken for this visit. BMI: There is no height or weight on file to calculate BMI. Well nourished, well developed, in no acute distress HEENT: normocephalic, atraumatic Neck: no JVD, carotid bruits or masses Cardiac:  irreg-irreg; no significant murmurs, no rubs, or gallops Lungs:  CTA b/l, no  wheezing, rhonchi or rales Abd: soft, nontender MS: no deformity or atrophy Ext: no edema Skin: warm and dry, no rash Neuro:  No gross deficits appreciated Psych: euthymic mood, full affect   EKG:  Done today and reviewed by myself shows  Afib 94bpm, RBBB (old)   09/10/2020: EPS/ablation CONCLUSIONS: 1. Sinus rhythm upon presentation.   2. Successful electrical isolation and anatomical encircling of all four pulmonary veins with radiofrequency current. 3. No inducible arrhythmias following ablation both on and off of dobutamine 4. No early apparent complications.   07/13/2019: stress myoview The left ventricular ejection fraction is mildly decreased (45-54%). Nuclear stress EF: 47%. Blood pressure demonstrated a hypertensive response to exercise. There was no ST segment deviation noted during stress. Defect 1: There is a large defect of severe severity present in the basal inferior, mid inferior and apical inferior location.  Findings consistent with prior myocardial infarction with peri-infarct ischemia. This is a low risk study.   Abnormal, low risk stress nuclear study with prior inferior infarct and mild peri-infarct ischemia.  Gated ejection fraction 47% with hypokinesis of the inferior basal wall.  Mild left ventricular enlargement.   TTE 02/01/2019  1. Left ventricular ejection fraction, by visual estimation, is 50 to 55%. The left ventricle has low normal function. There is moderately increased left ventricular hypertrophy.  2. Left ventricular diastolic function could not be evaluated.  3. Mildly dilated left ventricular internal cavity size.  4. The left ventricle has no regional wall motion abnormalities.  5. Global right ventricle has normal systolic function.The right ventricular size is normal. No increase in right ventricular wall thickness.  6. Left atrial size was mildly dilated.  7. Right atrial size was normal.  8. The mitral valve is normal in structure.  Trivial mitral valve regurgitation.  9. The tricuspid valve is normal in structure. 10. The aortic valve is tricuspid. Aortic valve regurgitation is not visualized. No evidence of aortic valve sclerosis or stenosis. 11. The pulmonic valve was grossly normal. Pulmonic valve regurgitation is not visualized. 12. Aortic dilatation noted. 13. There is mild dilatation of the ascending aorta measuring 39 mm. 14. The inferior vena cava is normal in size with greater than 50% respiratory variability, suggesting right atrial pressure of 3 mmHg.    Recent Labs: 08/01/2020: TSH 2.140 08/12/2020: ALT 27 08/13/2020: BUN 26; Creatinine, Ser 1.45; Hemoglobin 12.7; Platelets 179; Potassium 4.3; Sodium 137  08/01/2020: Chol/HDL Ratio 2.2; Cholesterol, Total 120; HDL 54; LDL Chol Calc (NIH) 50; Triglycerides 81   CrCl cannot be calculated (Patient's most recent lab result is older than the maximum 21 days allowed.).   Wt Readings from Last 3 Encounters:  02/09/21 250 lb (113.4 kg)  01/22/21 261 lb 9.6 oz (118.7 kg)  01/21/21 263 lb (119.3 kg)     Other studies reviewed: Additional studies/records reviewed today include: summarized above  ASSESSMENT AND PLAN:  Persistent Afib CHA2DS2Vasc is 2, on Eliquis, appropriately dosed Recurrent Afib, symptomatic Will plan for DCCV Discussed procedure,potential risks/benefits, he is familiar and agreeable He would like to try and avoid AAD  Reminded of the importance of no missed doses, he sounds like he is excellent with his medicines. Will see Dr. Curt Bears in a couple months, revisit Af burden, possible repeat ablation  HTN Looks good  OSA Good compliance   Disposition: F/u with Dr. Curt Bears post DCCV a couple months  Current medicines are reviewed at length with the patient today.  The patient did not have any concerns regarding medicines.  Venetia Night, PA-C 02/13/2021 5:31 AM     [facility] [place]  [phone] (office)  [phone] (fax)

## 2021-02-13 NOTE — Progress Notes (Signed)
Cardiology Office Note Date:  02/13/2021  Patient ID:  Clayton [name], Clayton [name] 1945-08-02, MRN [id number] PCP:  Ma Hillock, DO  Cardiologist:  Dr. Claiborne Billings Electrophysiologist: Dr. Curt Bears    Chief Complaint: recurrent AFib  History of Present Illness: Clayton [name] is a 76 y.o. male with history of HTN, OSA, Afib  He comes in today to be seen for Dr. Curt Bears, last seen by him 11.29/22, post ablation visit, had not to then had further Afib, was doing well walking regularly, playing golf, and riding stationary bike, working on weight loss  He saw dr. Claiborne Billings 01/22/21, discussed stress myoview, with no CP suspected defect was more likely attenuation. Was doing well, continued to have success with his exercise and weight loss journey. Was very compliant with CPAP Discussed strategy to increase amlodipine if BP is elevated Planned to see him in 40mo   Pt called/sent chart message to report his BP, discussed exercising and feeling well since his visit with Dr. Claiborne Billings, though developed some lightheadedness after exercise and his BP machine suggests he is back in Afib and very disappointed.  TODAY He is accompanied by his wife All in all he is doing really well He is very invested in his health both physical and mental health and very compliant with his medicines and CPAP  He does not have overt palpitations in Afib, but does not feel as well as he does in SR either 1.He notes a slight chatter to his teeth 2. Feels heavy headed 3. Reduced  exertional capacity, fatigues easier with his usual exercise and activities  He brings his home BPs and HRs, all are very good  His preference would be less medicines if possible, while he is not against medicines, does tend to feel like they contribute to some general vague sense of lightheadedness (a day in day out feeling eben in SR) He would be very open to the idea of another ablation procedure if felt reasonable.   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 Humphrey Rolls; CPAP titration 11/12/04- Dr. Humphrey Rolls    Past Surgical History:  Procedure Laterality Date   ATRIAL FIBRILLATION ABLATION N/A 09/10/2020   Procedure: [place];  Surgeon: Constance Haw, MD;  Location: [place] [facility];  Service: Cardiovascular;  Laterality: N/A;   CARDIAC CATHETERIZATION  11/03/2005   CARDIOVERSION N/A 09/26/2018   Procedure: CARDIOVERSION;  Surgeon: Geralynn Rile, MD;  Location: [place];  Service: Endoscopy;  Laterality: N/A;   CARDIOVERSION N/A 03/13/2019   Procedure: CARDIOVERSION;  Surgeon: Skeet Latch, MD;  Location: [place];  Service: Cardiovascular;  Laterality: N/A;   CATARACT EXTRACTION  01/19/2012   KNEE ARTHROSCOPY     left knee- 2002    TOTAL HIP ARTHROPLASTY  06/08/2011   Procedure: TOTAL HIP  ARTHROPLASTY ANTERIOR APPROACH;  Surgeon: [name], MD;  [facility];  Service: Orthopedics;  Laterality: Left;   TOTAL HIP ARTHROPLASTY Right 08/12/2020   ANTERIOR APPROACH   TOTAL HIP ARTHROPLASTY Right 08/12/2020   Procedure: TOTAL HIP ARTHROPLASTY ANTERIOR APPROACH;  Surgeon: Paralee Cancel, MD;  Location[facility];  Service: Orthopedics;  Laterality: Right;    Current Outpatient Medications  Medication Sig Dispense Refill   amLODipine (NORVASC) 5 MG tablet TAKE 1.5 TABLETS BY MOUTH DAILY. 135 tablet 2    Coenzyme Q10 200 MG capsule Take 200 mg by mouth in the morning.     ELIQUIS 5 MG TABS tablet TAKE 1 TABLET BY MOUTH TWICE A DAY 180 tablet 1   ezetimibe (ZETIA) 10 MG tablet Take 1 tablet (10 mg total) by mouth in the morning. 90 tablet 3   metFORMIN (GLUCOPHAGE) 500 MG tablet Take 1 tablet (500 mg total) by mouth daily with breakfast. 90 tablet 0   metoprolol succinate (TOPROL-XL) 50 MG 24 hr tablet Take 50 mg by mouth 2 (two) times daily. Take twice a day.     olmesartan (BENICAR) 40 MG tablet TAKE 1 TABLET BY MOUTH EVERY DAY 90 tablet 3   Pitavastatin Calcium (LIVALO) 2 MG TABS Take 1 tablet (2 mg total) by mouth every evening. 90 tablet 3   spironolactone (ALDACTONE) 25 MG tablet TAKE 1/2 TABLET BY MOUTH EVERY DAY 45 tablet 3   tamsulosin (FLOMAX) 0.4 MG CAPS capsule Take 0.4 mg by mouth in the morning.     No current facility-administered medications for this visit.    Allergies:   Patient has no known allergies.   Social History:  The patient  reports that he has never smoked. He has never used smokeless tobacco. He reports that he does not drink alcohol and does not use drugs.   Family History:  The patient's family history includes Arthritis in his father; Diabetes in his maternal grandfather, mother, and sister; Heart disease in his father, maternal grandfather, and paternal grandfather; Hyperlipidemia in his mother; Hypertension in his maternal grandfather and mother; Obesity in his mother; Stroke in his maternal grandmother and mother.  ROS:  Please see the history of present illness.    All other systems are reviewed and otherwise negative.   PHYSICAL EXAM:  VS:  There were no vitals taken for this visit. BMI: There is no height or weight on file to calculate BMI. Well nourished, well developed, in no acute distress HEENT: normocephalic, atraumatic Neck: no JVD, carotid bruits or masses Cardiac:  irreg-irreg; no significant murmurs, no rubs, or gallops Lungs:  CTA b/l, no  wheezing, rhonchi or rales Abd: soft, nontender MS: no deformity or atrophy Ext: no edema Skin: warm and dry, no rash Neuro:  No gross deficits appreciated Psych: euthymic mood, full affect   EKG:  Done today and reviewed by myself shows  Afib 94bpm, RBBB (old)   09/10/2020: EPS/ablation CONCLUSIONS: 1. Sinus rhythm upon presentation.   2. Successful electrical isolation and anatomical encircling of all four pulmonary veins with radiofrequency current. 3. No inducible arrhythmias following ablation both on and off of dobutamine 4. No early apparent complications.   07/13/2019: stress myoview The left ventricular ejection fraction is mildly decreased (45-54%). Nuclear stress EF: 47%. Blood pressure demonstrated a hypertensive response to exercise. There was no ST segment deviation noted during stress. Defect 1: There is a large defect of severe severity present in the basal inferior, mid inferior and apical inferior location.  Findings consistent with prior myocardial infarction with peri-infarct ischemia. This is a low risk study.   Abnormal, low risk stress nuclear study with prior inferior infarct and mild peri-infarct ischemia.  Gated ejection fraction 47% with hypokinesis of the inferior basal wall.  Mild left ventricular enlargement.   TTE 02/01/2019  1. Left ventricular ejection fraction, by visual estimation, is 50 to 55%. The left ventricle has low normal function. There is moderately increased left ventricular hypertrophy.  2. Left ventricular diastolic function could not be evaluated.  3. Mildly dilated left ventricular internal cavity size.  4. The left ventricle has no regional wall motion abnormalities.  5. Global right ventricle has normal systolic function.The right ventricular size is normal. No increase in right ventricular wall thickness.  6. Left atrial size was mildly dilated.  7. Right atrial size was normal.  8. The mitral valve is normal in structure.  Trivial mitral valve regurgitation.  9. The tricuspid valve is normal in structure. 10. The aortic valve is tricuspid. Aortic valve regurgitation is not visualized. No evidence of aortic valve sclerosis or stenosis. 11. The pulmonic valve was grossly normal. Pulmonic valve regurgitation is not visualized. 12. Aortic dilatation noted. 13. There is mild dilatation of the ascending aorta measuring 39 mm. 14. The inferior vena cava is normal in size with greater than 50% respiratory variability, suggesting right atrial pressure of 3 mmHg.    Recent Labs: 08/01/2020: TSH 2.140 08/12/2020: ALT 27 08/13/2020: BUN 26; Creatinine, Ser 1.45; Hemoglobin 12.7; Platelets 179; Potassium 4.3; Sodium 137  08/01/2020: Chol/HDL Ratio 2.2; Cholesterol, Total 120; HDL 54; LDL Chol Calc (NIH) 50; Triglycerides 81   CrCl cannot be calculated (Patient's most recent lab result is older than the maximum 21 days allowed.).   Wt Readings from Last 3 Encounters:  02/09/21 250 lb (113.4 kg)  01/22/21 261 lb 9.6 oz (118.7 kg)  01/21/21 263 lb (119.3 kg)     Other studies reviewed: Additional studies/records reviewed today include: summarized above  ASSESSMENT AND PLAN:  Persistent Afib CHA2DS2Vasc is 2, on Eliquis, appropriately dosed Recurrent Afib, symptomatic Will plan for DCCV Discussed procedure,potential risks/benefits, he is familiar and agreeable He would like to try and avoid AAD  Reminded of the importance of no missed doses, he sounds like he is excellent with his medicines. Will see Dr. Curt Bears in a couple months, revisit Af burden, possible repeat ablation  HTN Looks good  OSA Good compliance   Disposition: F/u with Dr. Curt Bears post DCCV a couple months  Current medicines are reviewed at length with the patient today.  The patient did not have any concerns regarding medicines.  Venetia Night, PA-C 02/13/2021 5:31 AM     [facility] [place] [address] [phone] (office)  [phone] (fax)

## 2021-02-14 LAB — BASIC METABOLIC PANEL
BUN/Creatinine Ratio: 21 (ref 10–24)
BUN: 33 mg/dL — ABNORMAL HIGH (ref 8–27)
CO2: 21 mmol/L (ref 20–29)
Calcium: 9.9 mg/dL (ref 8.6–10.2)
Chloride: 107 mmol/L — ABNORMAL HIGH (ref 96–106)
Creatinine, Ser: 1.56 mg/dL — ABNORMAL HIGH (ref 0.76–1.27)
Glucose: 100 mg/dL — ABNORMAL HIGH (ref 70–99)
Potassium: 4.9 mmol/L (ref 3.5–5.2)
Sodium: 141 mmol/L (ref 134–144)
eGFR: 46 mL/min/{1.73_m2} — ABNORMAL LOW (ref >59)

## 2021-02-14 LAB — CBC
Hematocrit: 46.2 % (ref 37.5–51.0)
Hemoglobin: 15.3 g/dL (ref 13.0–17.7)
MCH: 30.5 pg (ref 26.6–33.0)
MCHC: 33.1 g/dL (ref 31.5–35.7)
MCV: 92 fL (ref 79–97)
Platelets: 189 10*3/uL (ref 150–450)
RBC: 5.02 x10E6/uL (ref 4.14–5.80)
RDW: 13.2 % (ref 11.6–15.4)
WBC: 7.3 10*3/uL (ref 3.4–10.8)

## 2021-02-23 ENCOUNTER — Encounter (HOSPITAL_COMMUNITY): Payer: Self-pay | Admitting: Cardiovascular Disease

## 2021-03-03 ENCOUNTER — Ambulatory Visit (HOSPITAL_COMMUNITY): Payer: Medicare PPO | Admitting: Anesthesiology

## 2021-03-03 ENCOUNTER — Ambulatory Visit (HOSPITAL_COMMUNITY)
Admission: RE | Admit: 2021-03-03 | Discharge: 2021-03-03 | Disposition: A | Discharge status: Home / Self Care | Payer: Medicare PPO | Attending: Cardiovascular Disease | Admitting: Cardiovascular Disease

## 2021-03-03 ENCOUNTER — Other Ambulatory Visit: Payer: Self-pay

## 2021-03-03 ENCOUNTER — Ambulatory Visit (HOSPITAL_BASED_OUTPATIENT_CLINIC_OR_DEPARTMENT_OTHER): Payer: Medicare PPO | Admitting: Anesthesiology

## 2021-03-03 ENCOUNTER — Encounter (HOSPITAL_COMMUNITY)
Admission: RE | Disposition: A | Discharge status: Home / Self Care | Payer: Self-pay | Source: Home / Self Care | Attending: Cardiovascular Disease

## 2021-03-03 DIAGNOSIS — I4891 Unspecified atrial fibrillation: Secondary | ICD-10-CM | POA: Diagnosis not present

## 2021-03-03 DIAGNOSIS — Z79899 Other long term (current) drug therapy: Secondary | ICD-10-CM | POA: Insufficient documentation

## 2021-03-03 DIAGNOSIS — I4819 Other persistent atrial fibrillation: Secondary | ICD-10-CM | POA: Diagnosis not present

## 2021-03-03 DIAGNOSIS — G4733 Obstructive sleep apnea (adult) (pediatric): Secondary | ICD-10-CM | POA: Diagnosis not present

## 2021-03-03 DIAGNOSIS — I10 Essential (primary) hypertension: Secondary | ICD-10-CM | POA: Diagnosis not present

## 2021-03-03 DIAGNOSIS — I119 Hypertensive heart disease without heart failure: Secondary | ICD-10-CM | POA: Insufficient documentation

## 2021-03-03 DIAGNOSIS — M1990 Unspecified osteoarthritis, unspecified site: Secondary | ICD-10-CM | POA: Insufficient documentation

## 2021-03-03 DIAGNOSIS — G4730 Sleep apnea, unspecified: Secondary | ICD-10-CM | POA: Diagnosis not present

## 2021-03-03 HISTORY — PX: CARDIOVERSION: SHX1299

## 2021-03-03 SURGERY — CARDIOVERSION
Anesthesia: General

## 2021-03-03 MED ORDER — PROPOFOL 10 MG/ML IV BOLUS
INTRAVENOUS | Status: DC | PRN
  Administered 2021-03-03: 50 mg via INTRAVENOUS
  Administered 2021-03-03: 10 mg via INTRAVENOUS

## 2021-03-03 MED ORDER — SODIUM CHLORIDE 0.9 % IV SOLN: INTRAVENOUS | Status: DC

## 2021-03-03 NOTE — Interval H&P Note (Signed)
History and Physical Interval Note:  03/03/2021 7:56 AM  Clayton [name]  has presented today for surgery, with the diagnosis of AFIB.  The various methods of treatment have been discussed with the patient and family. After consideration of risks, benefits and other options for treatment, the patient has consented to  Procedure(s): CARDIOVERSION (N/A) as a surgical intervention.  The patient's history has been reviewed, patient examined, no change in status, stable for surgery.  I have reviewed the patient's chart and labs.  Questions were answered to the patient's satisfaction.     Jenkins Rouge

## 2021-03-03 NOTE — Progress Notes (Signed)
Left antecubital

## 2021-03-03 NOTE — Anesthesia Preprocedure Evaluation (Addendum)
Anesthesia Evaluation  Patient identified by MRN, date of birth, ID band Patient awake    Reviewed: Allergy & Precautions, NPO status , Patient's Chart, lab work & pertinent test results  History of Anesthesia Complications Negative for: history of anesthetic complications  Airway Mallampati: III  TM Distance: >3 FB Neck ROM: Full    Dental  (+) Dental Advisory Given   Pulmonary sleep apnea ,    breath sounds clear to auscultation       Cardiovascular hypertension, Pt. on medications + dysrhythmias Atrial Fibrillation  Rhythm:Irregular  1. Left ventricular ejection fraction, by visual estimation, is 50 to  55%. The left ventricle has low normal function. There is moderately  increased left ventricular hypertrophy.  2. Left ventricular diastolic function could not be evaluated.  3. Mildly dilated left ventricular internal cavity size.  4. The left ventricle has no regional wall motion abnormalities.  5. Global right ventricle has normal systolic function.The right  ventricular size is normal. No increase in right ventricular wall  thickness.  6. Left atrial size was mildly dilated.  7. Right atrial size was normal.  8. The mitral valve is normal in structure. Trivial mitral valve  regurgitation.  9. The tricuspid valve is normal in structure.  10. The aortic valve is tricuspid. Aortic valve regurgitation is not  visualized. No evidence of aortic valve sclerosis or stenosis.  11. The pulmonic valve was grossly normal. Pulmonic valve regurgitation is  not visualized.  12. Aortic dilatation noted.  13. There is mild dilatation of the ascending aorta measuring 39 mm.  14. The inferior vena cava is normal in size with greater than 50%  respiratory variability, suggesting right atrial pressure of 3 mmHg.    Neuro/Psych negative neurological ROS  negative psych ROS   GI/Hepatic negative GI ROS, Neg liver ROS,    Endo/Other  negative endocrine ROS  Renal/GU negative Renal ROS     Musculoskeletal  (+) Arthritis ,   Abdominal   Peds  Hematology negative hematology ROS (+) eliquis  Lab Results      Component                Value               Date                      WBC                      7.3                 02/13/2021                HGB                      15.3                02/13/2021                HCT                      46.2                02/13/2021                MCV                      92  02/13/2021                PLT                      189                 02/13/2021              Anesthesia Other Findings   Reproductive/Obstetrics                           Anesthesia Physical Anesthesia Plan  ASA: 2  Anesthesia Plan: General   Post-op Pain Management: Minimal or no pain anticipated   Induction: Intravenous  PONV Risk Score and Plan: 2 and Treatment may vary due to age or medical condition  Airway Management Planned: Mask  Additional Equipment: None  Intra-op Plan:   Post-operative Plan:   Informed Consent: I have reviewed the patients History and Physical, chart, labs and discussed the procedure including the risks, benefits and alternatives for the proposed anesthesia with the patient or authorized representative who has indicated his/her understanding and acceptance.     Dental advisory given  Plan Discussed with: CRNA and Anesthesiologist  Anesthesia Plan Comments:         Anesthesia Quick Evaluation

## 2021-03-03 NOTE — CV Procedure (Signed)
[facility]: Anesthesia: Dr Ermalene Postin Propofol On Rx Eliquis no missed doses  DCC x 2 sync 150J then 200J  Converted from afib rate 115 to NSR rate 85 bpm No immediate neurologic sequelae  Jenkins Rouge MD [facility]

## 2021-03-03 NOTE — Transfer of Care (Signed)
Immediate Anesthesia Transfer of Care Note  Patient: Clayton [name]  Procedure(s) Performed: CARDIOVERSION  Patient Location: Endoscopy Unit  Anesthesia Type:General  Level of Consciousness: awake, drowsy and patient cooperative  Airway & Oxygen Therapy: Patient Spontanous Breathing  Post-op Assessment: Report given to RN, Post -op Vital signs reviewed and stable and Patient moving all extremities X 4  Post vital signs: Reviewed and stable  Last Vitals:  Vitals Value Taken Time  BP    Temp    Pulse    Resp    SpO2      Last Pain:  Vitals:   03/03/21 0746  TempSrc: Temporal  PainSc: 0-No pain         Complications: No notable events documented.

## 2021-03-04 ENCOUNTER — Encounter (HOSPITAL_COMMUNITY): Payer: Self-pay | Admitting: Cardiovascular Disease

## 2021-03-04 NOTE — Progress Notes (Signed)
Clayton Clayton [name] [facility] Phone: [phone] Subjective:   Clayton Clayton [name], am serving as [name] scribe for Dr. Hulan Saas.  This visit occurred during the SARS-CoV-2 public health emergency.  Safety protocols were in place, including screening questions prior to the visit, additional usage of staff PPE, and extensive cleaning of exam room while observing appropriate contact time as indicated for disinfecting solutions.   I'm seeing this patient by the request  of:  Kuneff, Renee A, DO  CC: Low back pain  SEG:[id number]  Clayton Clayton [name] is [name] 76 y.o. male coming in with complaint of back and neck pain. OMT on 01/21/2021. Patient states that he went back into [name]-fib and had to get shocked.   Knee pain is doing much better. Less swelling. Uses ice and TENS 2x [name] week but is doing much better. Is active and this seems to help his pain.   Patient did have loss of his sister recently to suicide. 2000 increasing stress recently.  Patient has not been quite as active secondary to this.  Patient wants to become more active and is motivated to do so.  Medications patient has been prescribed: None         Reviewed prior external information including notes and imaging from previsou exam, outside providers and external EMR if available.   As well as notes that were available from care everywhere and other healthcare systems.  Past medical history, social, surgical and family history all reviewed in electronic medical record.  Clayton [name] pertanent information unless stated regarding to the chief complaint.   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 Humphrey Rolls; CPAP titration 11/12/04- Dr. Humphrey Rolls    Clayton [name] Known Allergies   Review of Systems:  Clayton [name] headache, visual changes, nausea, vomiting, diarrhea, constipation, dizziness, abdominal pain, skin rash, fevers, chills, night sweats, weight loss, swollen lymph nodes,  joint swelling, chest pain, shortness of breath, mood changes. POSITIVE muscle aches, body aches  Objective  Blood pressure 132/86, pulse 93, height 6\' 3"  (1.905 m), weight 256 lb (116.1 kg), SpO2 98 %.   General: Clayton [name] apparent distress alert and oriented x3 mood and affect normal, dressed appropriately.  Seems [name] little more anxious HEENT: Pupils equal, extraocular movements intact  Respiratory: Patient's speak in full sentences and does not appear short of breath  Cardiovascular: Clayton [name] lower extremity edema, non tender, Clayton [name] erythema  Low back exam does have some loss of lordosis.  Some tenderness to palpation in the paraspinal musculature.  Clayton [name] significant tenderness in the thoracolumbar junction.  Increasing tightness also in the thoracic spine overall.  Osteopathic findings  C2 flexed rotated and side bent right C7 flexed rotated and side bent left T3 extended rotated and side bent right inhaled rib T9 extended rotated and side bent left L2 flexed rotated and side bent right Sacrum right on right       Assessment and Plan:  Degenerative disc disease, lumbar Significant increase in  tightness noted.  Discussed icing regimen and home exercise, discussed which activities to do which wants to avoid.  Patient has had [name] lot of stress and also had loss of [name] family member to suicide that likely is contributing as well.  Increase activity slowly.  Follow-up again in 6 to 8 weeks.   Nonallopathic problems  Decision today to treat with OMT was based on Physical Exam  After verbal consent patient was treated with HVLA, ME, FPR techniques in cervical,  rib, thoracic, lumbar, and sacral  areas  Patient tolerated the procedure well with improvement in symptoms  Patient given exercises, stretches and lifestyle modifications  See medications in patient instructions if given  Patient will follow up in 4-8 weeks      The above documentation has been reviewed and is accurate and complete Clayton Pulley, DO       Note: This dictation was prepared with Dragon dictation along with smaller phrase technology. Any transcriptional errors that result from this process are unintentional.

## 2021-03-04 NOTE — Anesthesia Postprocedure Evaluation (Signed)
Anesthesia Post Note  Patient: NKOSI CORTRIGHT  Procedure(s) Performed: CARDIOVERSION     Patient location during evaluation: Endoscopy Anesthesia Type: General Level of consciousness: awake and alert Pain management: pain level controlled Vital Signs Assessment: post-procedure vital signs reviewed and stable Respiratory status: spontaneous breathing, nonlabored ventilation, respiratory function stable and patient connected to nasal cannula oxygen Cardiovascular status: blood pressure returned to baseline and stable Postop Assessment: no apparent nausea or vomiting Anesthetic complications: no   No notable events documented.  Last Vitals:  Vitals:   03/03/21 0903 03/03/21 0907  BP: (!) 156/97 (!) 156/97  Pulse: 66 62  Resp: 15 (!) 22  Temp:    SpO2: 96% 98%    Last Pain:  Vitals:   03/04/21 1033  TempSrc:   PainSc: 0-No pain                 Aayat Hajjar

## 2021-03-05 ENCOUNTER — Ambulatory Visit: Payer: Medicare PPO | Admitting: Family Medicine

## 2021-03-05 ENCOUNTER — Other Ambulatory Visit: Payer: Self-pay

## 2021-03-05 VITALS — BP 132/86 | HR 93 | Ht 75.0 in | Wt 256.0 lb

## 2021-03-05 DIAGNOSIS — M9901 Segmental and somatic dysfunction of cervical region: Secondary | ICD-10-CM | POA: Diagnosis not present

## 2021-03-05 DIAGNOSIS — M9903 Segmental and somatic dysfunction of lumbar region: Secondary | ICD-10-CM

## 2021-03-05 DIAGNOSIS — M9902 Segmental and somatic dysfunction of thoracic region: Secondary | ICD-10-CM

## 2021-03-05 DIAGNOSIS — M9908 Segmental and somatic dysfunction of rib cage: Secondary | ICD-10-CM | POA: Diagnosis not present

## 2021-03-05 DIAGNOSIS — M9904 Segmental and somatic dysfunction of sacral region: Secondary | ICD-10-CM | POA: Diagnosis not present

## 2021-03-05 DIAGNOSIS — M5136 Other intervertebral disc degeneration, lumbar region: Secondary | ICD-10-CM | POA: Diagnosis not present

## 2021-03-05 NOTE — Assessment & Plan Note (Signed)
Significant increase in tightness noted.  Discussed icing regimen and home exercise, discussed which activities to do which wants to avoid.  Patient has had a lot of stress and also had loss of a family member to suicide that likely is contributing as well.  Increase activity slowly.  Follow-up again in 6 to 8 weeks.

## 2021-03-05 NOTE — Patient Instructions (Addendum)
Sorry for your loss We are here if you need [name] See [name] in 4-5 weeks

## 2021-03-17 ENCOUNTER — Ambulatory Visit (INDEPENDENT_AMBULATORY_CARE_PROVIDER_SITE_OTHER): Payer: Medicare PPO | Admitting: Psychologist

## 2021-03-17 DIAGNOSIS — F411 Generalized anxiety disorder: Secondary | ICD-10-CM

## 2021-03-17 NOTE — Progress Notes (Signed)
Clayton [name] Counselor/Therapist Progress Note  Patient ID: Clayton [name], MRN: [id number],    Date: 03/17/2021  Time Spent: 12:03 pm to 12:42 pm; total time: 39 minutes  This session was held via video webex teletherapy due to the coronavirus risk at this time. The patient consented to video teletherapy and was located at his home during this session. He is aware it is the responsibility of the patient to secure confidentiality on his end of the session. The provider was in a private home office for the duration of this session. Limits of confidentiality were discussed with the patient.   Treatment Type: Individual Therapy  Reported Symptoms: Patient described himself as doing well despite some circumstances that have occurred.  Mental Status Exam: Appearance:  Well Groomed     Behavior: Appropriate  Motor: Normal  Speech/Language:  Normal Rate  Affect: Appropriate  Mood: normal  Thought process: normal  Thought content:   WNL  Sensory/Perceptual disturbances:   WNL  Orientation: oriented to person  Attention: Good  Concentration: Good  Memory: WNL  Fund of knowledge:  Good  Insight:   Fair  Judgment:  Good  Impulse Control: Good   Risk Assessment: Danger to Self:  No Self-injurious Behavior: No Danger to Others: No Duty to Warn:no Physical Aggression / Violence:No  Access to Firearms a concern: No  Gang Involvement:No   Subjective:  The patient described himself as doing well despite having some challenging circumstances. Specifically, he has experienced Afib since the last session resulting in cardioversion, followed by the death of his sister who died by suicide. He spent the session reflecting and processing emotions he was experiencing. He described Living with the Enemy as being very beneficial for him. He was open to exploring his values and allowing them to define moving forward if he experiences Afib again. He asked to follow up and was agreeable to  the homework assignment. He denied suicidal and homicidal ideation.   Interventions: Worked on developing a therapeutic relationship with the patient using active listening and reflective statements. Provided emotional support using empathy and validation. Used summary statements. Praised patient for doing well despite having some challenging circumstances. Processed thoughts and emotions related to experiencing Afib and having a cardioversion done. Processed emotions related to the death of his sister. Explored what has helped the patient so that he does not feel stuck. Assisted in problem solving. Encouraged patient to consider reading ahead to explore his values. Briefly processed thoughts related to grief.  Assigned homework. Assessed for suicidal and homicidal ideation  Homework: Patient will complete handout on values  Next Session: Emotional support and review homework  Diagnosis: F41.1 generalized anxiety disorder.   Plan:   Client Abilities: Friendly and easy to develop rapport  Client Preferences: ACT and CBT  Client statement of Needs: Coping skills  Treatment Level: Outpatient   Goals Reduce overall frequency, intensity, and duration of anxiety Stabilize anxiety level wile increasing ability to function Enhance ability to effectively cope with full variety of stressors Learn and implement coping skills that result in a reduction of anxiety   Objectives target date for all objectives is 09/24/2021 Verbalize an understanding of the cognitive, physiological, and behavioral components of anxiety Learning and implement calming skills to reduce overall anxiety Verbalize an understanding of the role that cognitive biases play in excessive irrational worry and persistent anxiety symptoms Identify, challenge, and replace based fearful talk Learn and implement problem solving strategies Identify and engage in pleasant activities Learning and  implement personal and interpersonal  skills to reduce anxiety and improve interpersonal relationships Learn to accept limitations in life and commit to tolerating, rather than avoiding, unpleasant emotions while accomplishing meaningful goals Identify major life conflicts from the past and present that form the basis for present anxiety Maintain involvement in work, family, and social activities Reestablish a consistent sleep-wake cycle Cooperate with a medical evaluation  Interventions Engage the patient in behavioral activation Use instruction, modeling, and role-playing to build the client's general social, communication, and/or conflict resolution skills Use Acceptance and Commitment Therapy to help client accept uncomfortable realities in order to accomplish value-consistent goals Reinforce the client's insight into the role of his/her past emotional pain and present anxiety  Support the client in following through with work, family, and social activities Teach and implement sleep hygiene practices  Refer the patient to a physician for a psychotropic medication consultation Monito the clint's psychotropic medication compliance Discuss how anxiety typically involves excessive worry, various bodily expressions of tension, and avoidance of what is threatening that interact to maintain the problem  Teach the patient relaxation skills Assign the patient homework Discuss examples demonstrating that unrealistic worry overestimates the probability of threats and underestimates patient's ability  Assist the patient in analyzing his or her worries Help patient understand that avoidance is reinforcing   The patient reviewed the treatment plan on 10/08/2020. The patient approved of the treatment plan.    Conception Chancy, PsyD

## 2021-03-18 ENCOUNTER — Telehealth: Payer: Self-pay | Admitting: *Deleted

## 2021-03-18 NOTE — Telephone Encounter (Signed)
? ?  Patient Name: Clayton [name]  ?DOB: [birth year] ?MRN: [id number] ? ?Primary Cardiologist: Shelva Majestic, MD ? ?Chart reviewed as part of pre-operative protocol coverage.  ? ?IF SIMPLE EXTRACTION/CLEANINGS: Simple dental extractions are considered low risk procedures per guidelines and generally do not require any specific cardiac clearance. It is also generally accepted that for simple extractions and dental cleanings, there is no need to interrupt blood thinner therapy. ? ? ?SBE prophylaxis is not required for the patient from a cardiac standpoint. ? ?I will route this recommendation to the requesting party via Epic fax function and remove from pre-op pool. ? ?Please call with questions. ? ?Elgie Collard, PA-C ?03/18/2021, 4:24 PM ? ?

## 2021-03-18 NOTE — Telephone Encounter (Signed)
? ?  Pre-operative Risk Assessment  ?  ?Patient Name: Clayton [name]  ?DOB: May 17, 1945 ?MRN: [id number]  ? ? ? ?Request for Surgical Clearance   ? ?Procedure: " Upper Left Central extracted . Tooth is  mobile with little to no remaining bone support, and will be a straightforward in removing. I will curette the socket and pack the extraction  site  with Gelform and suture. I anticipate that even though Sonia Side is on Eliquis, Bleeding will be minimal. Please advise if patient needs to change medication." ? ?Date of Surgery:  Clearance TBD                              ?   ?Surgeon:  Melina Copa. Shelor,DDS,PA ?Surgeon's Group or Practice Name:   Melina Copa. Shelor, DDS ?Phone number:  [phone] ?Fax number:  [phone] ?  ?Type of Clearance Requested:   ?- Medical  ?- Pharmacy:  Hold Apixaban (Eliquis)   ?  ?Type of Anesthesia:  Not Indicated ?  ?Additional requests/questions:  Please advise surgeon/provider what medications should be held. ? ?Signed, ?Trixie Dredge V   ?03/18/2021, 2:35 PM   ?

## 2021-03-19 ENCOUNTER — Ambulatory Visit (INDEPENDENT_AMBULATORY_CARE_PROVIDER_SITE_OTHER): Payer: Medicare PPO | Admitting: Family Medicine

## 2021-03-19 ENCOUNTER — Encounter (INDEPENDENT_AMBULATORY_CARE_PROVIDER_SITE_OTHER): Payer: Self-pay | Admitting: Family Medicine

## 2021-03-19 ENCOUNTER — Other Ambulatory Visit: Payer: Self-pay

## 2021-03-19 VITALS — BP 171/94 | HR 65 | Ht 75.0 in | Wt 251.0 lb

## 2021-03-19 DIAGNOSIS — I10 Essential (primary) hypertension: Secondary | ICD-10-CM | POA: Diagnosis not present

## 2021-03-19 DIAGNOSIS — E66812 Obesity, class 2: Secondary | ICD-10-CM

## 2021-03-19 DIAGNOSIS — R7303 Prediabetes: Secondary | ICD-10-CM

## 2021-03-19 DIAGNOSIS — Z6831 Body mass index (BMI) 31.0-31.9, adult: Secondary | ICD-10-CM | POA: Diagnosis not present

## 2021-03-19 DIAGNOSIS — I4891 Unspecified atrial fibrillation: Secondary | ICD-10-CM | POA: Diagnosis not present

## 2021-03-19 DIAGNOSIS — F411 Generalized anxiety disorder: Secondary | ICD-10-CM | POA: Diagnosis not present

## 2021-03-19 DIAGNOSIS — E669 Obesity, unspecified: Secondary | ICD-10-CM | POA: Diagnosis not present

## 2021-03-19 MED ORDER — METFORMIN HCL 500 MG PO TABS: 500.0000 mg | ORAL_TABLET | Freq: Every day | ORAL | 0 refills | Status: DC

## 2021-03-23 NOTE — Progress Notes (Signed)
? ? ? ?Chief Complaint:  ? ?OBESITY ?Mark is here to discuss his progress with his obesity treatment plan along with follow-up of his obesity related diagnoses. Cliffard is on the Category 2 Plan and states he is following his eating plan approximately 0% of the time. Logun states he is doing 0 minutes 0 times per week. ? ?Today's visit was #: 33 ?Starting weight: 266 lbs ?Starting date: 11/01/2017 ?Today's weight: 251 lbs ?Today's date: 03/19/2021 ?Total lbs lost to date: 56 ?Total lbs lost since last in-office visit: 0 ? ?Interim History: Briton has struggled with following his plan since his last visit due to the death of his sister. He continues to follow up with Cardiology for his afib and hypertension.  ? ?Subjective:  ? ?1. Atrial fibrillation, unspecified type ([place]) ?Ramsay is on metoprolol and Eliquis. He saw Cardiology last in January, and he had a cardioversion on 03/03/2021. He has not taken his blood pressure medications today yet. ? ?2. Essential hypertension ?Leron's blood pressure is elevated today. His sister passed away 10 days ago. He is taking Norvasc 5 mg and Toprol XL 50 mg BID. He denies chest pain or shortness of breath. He has not taken his blood pressure medications today. His blood pressures at home runs at 110-125/75-65. He is seeing Cardiology on a regular basis. ? ?3. GAD (generalized anxiety disorder) ?Rockne is seeing a therapist on a regular basis.  ? ?4. Pre-diabetes ?Jaquell is taking metformin and he is doing well. He denies side effects.  ? ?Assessment/Plan:  ? ?1. Atrial fibrillation, unspecified type ([place]) ?Jerelle will continue to follow up with Cardiology. He will continue his medications as directed. ? ?2. Essential hypertension ?Kazim will continue to follow up with Cardiology. He will continue his medications as directed. ? ?3. GAD (generalized anxiety disorder) ?Janet will continue to follow up with his therapist. ? ?4. Pre-diabetes ?We will refill metformin 500 mg for 90  days. Keland will continue working on his dietary changes, exercise, and weight loss. ? ?- metFORMIN (GLUCOPHAGE) 500 MG tablet; Take 1 tablet (500 mg total) by mouth daily with breakfast.  Dispense: 90 tablet; Refill: 0 ? ?5. Obesity with current BMI of 31.5 ?Dajuan is currently in the action stage of change. As such, his goal is to continue with weight loss efforts. He has agreed to following a lower carbohydrate, vegetable and lean protein rich diet plan.  ? ?Exercise goals: Plans to restart walking and riding his stationary bike. ? ?Behavioral modification strategies: increasing lean protein intake, increasing water intake, no skipping meals, meal planning and cooking strategies, and planning for success. ? ?Oren has agreed to follow-up with our clinic in 6 weeks. He was informed of the importance of frequent follow-up visits to maximize his success with intensive lifestyle modifications for his multiple health conditions.  ? ?Objective:  ? ?Blood pressure (!) 171/94, pulse 65, height '6\' 3"'$  (1.905 m), SpO2 98 %. ?Body mass index is 32 kg/m?. ? ?General: Cooperative, alert, well developed, in no acute distress. ?HEENT: Conjunctivae and lids unremarkable. ?Cardiovascular: Regular rhythm.  ?Lungs: Normal work of breathing. ?Neurologic: No focal deficits.  ? ?Lab Results  ?Component Value Date  ? CREATININE 1.56 (H) 02/13/2021  ? BUN 33 (H) 02/13/2021  ? NA 141 02/13/2021  ? K 4.9 02/13/2021  ? CL 107 (H) 02/13/2021  ? CO2 21 02/13/2021  ? ?Lab Results  ?Component Value Date  ? ALT 27 08/12/2020  ? AST 23 08/12/2020  ? ALKPHOS 44 08/12/2020  ?  BILITOT 0.8 08/12/2020  ? ?Lab Results  ?Component Value Date  ? HGBA1C 5.8 (H) 07/04/2020  ? HGBA1C 5.8 (H) 08/01/2019  ? HGBA1C 5.8 (H) 09/04/2018  ? HGBA1C 6.0 (H) 11/01/2017  ? HGBA1C 5.4 04/14/2016  ? ?Lab Results  ?Component Value Date  ? INSULIN 3.9 08/01/2019  ? INSULIN 9.0 09/04/2018  ? INSULIN 22.4 11/01/2017  ? ?Lab Results  ?Component Value Date  ? TSH 2.140  08/01/2020  ? ?Lab Results  ?Component Value Date  ? CHOL 120 08/01/2020  ? HDL 54 08/01/2020  ? [place] 50 08/01/2020  ? TRIG 81 08/01/2020  ? CHOLHDL 2.2 08/01/2020  ? ?Lab Results  ?Component Value Date  ? VD25OH 39.95 01/24/2020  ? VD25OH 37.6 08/01/2019  ? VD25OH 43.2 09/04/2018  ? ?Lab Results  ?Component Value Date  ? WBC 7.3 02/13/2021  ? HGB 15.3 02/13/2021  ? HCT 46.2 02/13/2021  ? MCV 92 02/13/2021  ? PLT 189 02/13/2021  ? ?Lab Results  ?Component Value Date  ? IRON 71 01/24/2020  ? FERRITIN 77.1 01/24/2020  ? ? ?Obesity Behavioral Intervention:  ? ?Approximately 15 minutes were spent on the discussion below. ? ?ASK: ?We discussed the diagnosis of obesity with Awanda Mink today and Zuhayr agreed to give [name] permission to discuss obesity behavioral modification therapy today. ? ?ASSESS: ?Toussaint has the diagnosis of obesity and his BMI today is 31.5. Heith is in the action stage of change.  ? ?ADVISE: ?Odarius was educated on the multiple health risks of obesity as well as the benefit of weight loss to improve his health. He was advised of the need for long term treatment and the importance of lifestyle modifications to improve his current health and to decrease his risk of future health problems. ? ?AGREE: ?Multiple dietary modification options and treatment options were discussed and Jayren agreed to follow the recommendations documented in the above note. ? ?ARRANGE: ?Quintin was educated on the importance of frequent visits to treat obesity as outlined per CMS and USPSTF guidelines and agreed to schedule his next follow up appointment today. ? ?Attestation Statements:  ? ?Reviewed by clinician on day of visit: allergies, medications, problem list, medical history, surgical history, family history, social history, and previous encounter notes. ? ? ?I, Trixie Dredge, am acting as transcriptionist for Dennard Nip, MD. ? ?I have reviewed the above documentation for accuracy and completeness, and I agree with the  above. -  Dennard Nip, MD ? ? ?

## 2021-04-03 ENCOUNTER — Ambulatory Visit: Payer: Medicare PPO | Admitting: Cardiology

## 2021-04-03 ENCOUNTER — Other Ambulatory Visit: Payer: Self-pay

## 2021-04-03 ENCOUNTER — Encounter: Payer: Self-pay | Admitting: Cardiology

## 2021-04-03 VITALS — BP 168/100 | HR 72 | Ht 75.0 in | Wt 256.4 lb

## 2021-04-03 DIAGNOSIS — I4819 Other persistent atrial fibrillation: Secondary | ICD-10-CM

## 2021-04-03 NOTE — Patient Instructions (Signed)
Medication Instructions:  °Your physician recommends that you continue on your current medications as directed. Please refer to the Current Medication list given to you today. ° °*If you need a refill on your cardiac medications before your next appointment, please call your pharmacy* ° ° °Lab Work: °None ordered ° ° °Testing/Procedures: °None ordered ° ° °Follow-Up: °At [facility], you and your health needs are our priority.  As part of our continuing mission to provide you with exceptional heart care, we have created designated Provider Care Teams.  These Care Teams include your primary Cardiologist (physician) and Advanced Practice Providers (APPs -  Physician Assistants and Nurse Practitioners) who all work together to provide you with the care you need, when you need it. ° °Your next appointment:   °6 month(s) ° °The format for your next appointment:   °In Person ° °Provider:   °Will Camnitz, MD ° ° ° °Thank you for choosing [facility]!! ° ° °Laiden Milles, RN °[phone] °  °

## 2021-04-03 NOTE — Progress Notes (Signed)
? ?Electrophysiology Office Note ? ? ?Date:  04/03/2021  ? ?ID:  Clayton [name], DOB Apr 17, 1945, MRN [id number] ? ?PCP:  Clayton Pouch A, DO  ?Cardiologist:  Clayton [name] ?Primary Electrophysiologist:  Clayton Strassman Meredith Leeds, MD   ? ?Chief Complaint: AF ?  ?History of Present Illness: ?Clayton [name] is [name] 76 y.o. male who is being seen today for the evaluation of AF at the request of Kuneff, Renee A, DO. Presenting today for electrophysiology evaluation. ? ?He has [name] history significant for atrial fibrillation, hypertension, obstructive sleep apnea.  He was initially on flecainide.  He had multiple cardioversions but not did not remain in sinus rhythm.  He is now status post ablation 09/02/2020. ? ?Today, denies symptoms of palpitations, chest pain, shortness of breath, orthopnea, PND, lower extremity edema, claudication, dizziness, presyncope, syncope, bleeding, or neurologic sequela. The patient is tolerating medications without difficulties.  Currently feels well.  Unfortunately did go back into atrial fibrillation and had [name] cardioversion in February 2023.  Since then he has done well.  He has not had any shortness of breath or fatigue.  He is overall happy with his control.  He understands that his atrial fibrillation should not rule his life and has [name] different outlook.  He has been exercising consistently. ? ? ?Past Medical History:  ?Diagnosis Date  ? Arthritis   ? hips   ? Atrial fibrillation ([place])   ? Cataract   ? Cochlear Meniere syndrome of left ear 06/09/2015  ? CPAP (continuous positive airway pressure) dependence   ? Dysrhythmia   ? PAF- hx of 7 years ago   ? HLD (hyperlipidemia)   ? Hypertension 05/29/2010  ? ECHO-EF 67% wnl  ? Meniere disease   ? Obesity   ? Palpitations   ? Sleep apnea 09/14/2004  ? [facility]- Dr. Humphrey [name]; CPAP titration 11/12/04- Dr. Humphrey [name]  ? ?Past Surgical History:  ?Procedure Laterality Date  ? ATRIAL FIBRILLATION ABLATION N/[name] 09/10/2020  ? Procedure: ATRIAL FIBRILLATION  ABLATION;  Surgeon: Clayton Haw, MD;  Location: [facility];  Service: Cardiovascular;  Laterality: N/[name];  ? CARDIAC CATHETERIZATION  11/03/2005  ? CARDIOVERSION N/[name] 09/26/2018  ? Procedure: CARDIOVERSION;  Surgeon: Clayton Rile, MD;  Location: [facility];  Service: Endoscopy;  Laterality: N/[name];  ? CARDIOVERSION N/[name] 03/13/2019  ? Procedure: CARDIOVERSION;  Surgeon: Clayton Latch, MD;  Location: [facility];  Service: Cardiovascular;  Laterality: N/[name];  ? CARDIOVERSION N/[name] 03/03/2021  ? Procedure: CARDIOVERSION;  Surgeon: Clayton Hector, MD;  Location: [facility];  Service: Cardiovascular;  Laterality: N/[name];  ? CATARACT EXTRACTION  01/19/2012  ? KNEE ARTHROSCOPY    ? left knee- 2002   ? TOTAL HIP ARTHROPLASTY  06/08/2011  ? Procedure: TOTAL HIP ARTHROPLASTY ANTERIOR APPROACH;  Surgeon: Clayton Pole, MD;  Location: [facility];  Service: Orthopedics;  Laterality: Left;  ? TOTAL HIP ARTHROPLASTY Right 08/12/2020  ? ANTERIOR APPROACH  ? TOTAL HIP ARTHROPLASTY Right 08/12/2020  ? Procedure: TOTAL HIP ARTHROPLASTY ANTERIOR APPROACH;  Surgeon: Clayton Cancel, MD;  Location: [facility];  Service: Orthopedics;  Laterality: Right;  ? ? ? ?Current Outpatient Medications  ?Medication Sig Dispense Refill  ? amLODipine (NORVASC) 5 MG tablet Take 5 mg by mouth daily.    ? Coenzyme Q10 200 MG capsule Take 200 mg by mouth in the morning.    ? ELIQUIS 5 MG TABS tablet TAKE 1 TABLET BY MOUTH TWICE [name] DAY 180 tablet 1  ? ezetimibe (ZETIA) 10  MG tablet Take 1 tablet (10 mg total) by mouth in the morning. 90 tablet 3  ? metFORMIN (GLUCOPHAGE) 500 MG tablet Take 1 tablet (500 mg total) by mouth daily with breakfast. 90 tablet 0  ? metoprolol succinate (TOPROL-XL) 50 MG 24 hr tablet Take 50 mg by mouth 2 (two) times daily. Take twice [name] day.    ? Multiple Vitamins-Minerals (MULTIVITAMIN WITH MINERALS) tablet Take 1 tablet by mouth daily.    ? olmesartan (BENICAR) 40 MG tablet TAKE 1 TABLET BY MOUTH EVERY DAY (Patient  taking differently: Take 40 mg by mouth every evening.) 90 tablet 3  ? Pitavastatin Calcium (LIVALO) 2 MG TABS Take 1 tablet (2 mg total) by mouth every evening. 90 tablet 3  ? spironolactone (ALDACTONE) 25 MG tablet TAKE 1/2 TABLET BY MOUTH EVERY DAY 45 tablet 3  ? tamsulosin (FLOMAX) 0.4 MG CAPS capsule Take 0.4 mg by mouth in the morning.    ? ?No current facility-administered medications for this visit.  ? ? ?Allergies:   Patient has no known allergies.  ? ?Social History:  The patient  reports that he has never smoked. He has never used smokeless tobacco. He reports that he does not drink alcohol and does not use drugs.  ? ?Family History:  The patient's family history includes Arthritis in his father; Diabetes in his maternal grandfather, mother, and sister; Heart disease in his father, maternal grandfather, and paternal grandfather; Hyperlipidemia in his mother; Hypertension in his maternal grandfather and mother; Obesity in his mother; Stroke in his maternal grandmother and mother.  ? ?ROS:  Please see the history of present illness.   Otherwise, review of systems is positive for none.   All other systems are reviewed and negative.  ? ?PHYSICAL EXAM: ?VS:  BP (!) 168/100   Pulse 72   Ht '6\' 3"'$  (1.905 m)   Wt 256 lb 6.4 oz (116.3 kg)   SpO2 97%   BMI 32.05 kg/m?  , BMI Body mass index is 32.05 kg/m?. ?GEN: Well nourished, well developed, in no acute distress  ?HEENT: normal  ?Neck: no JVD, carotid bruits, or masses ?Cardiac: RRR; no murmurs, rubs, or gallops,no edema  ?Respiratory:  clear to auscultation bilaterally, normal work of breathing ?GI: soft, nontender, nondistended, + BS ?MS: no deformity or atrophy  ?Skin: warm and dry ?Neuro:  Strength and sensation are intact ?Psych: euthymic mood, full affect ? ?EKG:  EKG is ordered today. ?Personal review of the ekg ordered shows sinus rhythm, IVCD ? ?Recent Labs: ?08/01/2020: TSH 2.140 ?08/12/2020: ALT 27 ?02/13/2021: BUN 33; Creatinine, Ser 1.56; Hemoglobin  15.3; Platelets 189; Potassium 4.9; Sodium 141  ? ? ?Lipid Panel  ?   ?Component Value Date/Time  ? CHOL 120 08/01/2020 1031  ? TRIG 81 08/01/2020 1031  ? HDL 54 08/01/2020 1031  ? CHOLHDL 2.2 08/01/2020 1031  ? CHOLHDL 3.8 10/12/2016 0902  ? VLDL 17 04/14/2016 0937  ? [place] 50 08/01/2020 1031  ? LDLCALC 119 (H) 10/12/2016 0902  ? ? ? ?Wt Readings from Last 3 Encounters:  ?04/03/21 256 lb 6.4 oz (116.3 kg)  ?03/24/21 251 lb (113.9 kg)  ?03/05/21 256 lb (116.1 kg)  ?  ? ? ?Other studies Reviewed: ?Additional studies/ records that were reviewed today include: TTE 02/01/2019 ?Review of the above records today demonstrates:  ? 1. Left ventricular ejection fraction, by visual estimation, is 50 to 55%. The left ventricle has low normal function. There is moderately increased left ventricular hypertrophy. ? 2.  Left ventricular diastolic function could not be evaluated. ? 3. Mildly dilated left ventricular internal cavity size. ? 4. The left ventricle has no regional wall motion abnormalities. ? 5. Global right ventricle has normal systolic function.The right ventricular size is normal. No increase in right ventricular wall thickness. ? 6. Left atrial size was mildly dilated. ? 7. Right atrial size was normal. ? 8. The mitral valve is normal in structure. Trivial mitral valve regurgitation. ? 9. The tricuspid valve is normal in structure. ?10. The aortic valve is tricuspid. Aortic valve regurgitation is not visualized. No evidence of aortic valve sclerosis or stenosis. ?11. The pulmonic valve was grossly normal. Pulmonic valve regurgitation is not visualized. ?12. Aortic dilatation noted. ?13. There is mild dilatation of the ascending aorta measuring 39 mm. ?14. The inferior vena cava is normal in size with greater than 50% respiratory variability, suggesting right atrial pressure of 3 mmHg. ? ? ?ASSESSMENT AND PLAN: ? ?1.  Persistent atrial fibrillation: Status post ablation 09/02/2020.  Currently on metoprolol and Eliquis  5 mg twice daily.  CHA2DS2-VASc of 2.  He unfortunately did require [name] repeat cardioversion.  Mains in sinus rhythm today.  He is overall happy with his control.  If he does go back into atrial fibrillation, he w

## 2021-04-07 NOTE — Progress Notes (Signed)
?Charlann Boxer D.O. ?[facility] ?[place] ?Phone: [phone] ?Subjective:   ?I, Jacqualin Combes, am serving as a scribe for Dr. Hulan Saas. ? ?This visit occurred during the SARS-CoV-2 public health emergency.  Safety protocols were in place, including screening questions prior to the visit, additional usage of staff PPE, and extensive cleaning of exam room while observing appropriate contact time as indicated for disinfecting solutions.  ? ? ?I'm seeing this patient by the request  of:  Kuneff, Renee A, DO ? ?CC: Back and neck pain follow-up ? ?[id number]  ?Clayton [name] is a 76 y.o. male coming in with complaint of back and neck pain. OMT on 03/05/2021. Patient states that his back and knee are doing well since last visit.  ? ?Medications patient has been prescribed: None ? ?Taking: ? ? ?  ? ? ? ? ? ?Past Medical History:  ?Diagnosis Date  ? Arthritis   ? hips   ? Atrial fibrillation ([place])   ? Cataract   ? Cochlear Meniere syndrome of left ear 06/09/2015  ? CPAP (continuous positive airway pressure) dependence   ? Dysrhythmia   ? PAF- hx of 7 years ago   ? HLD (hyperlipidemia)   ? Hypertension 05/29/2010  ? ECHO-EF 67% wnl  ? Meniere disease   ? Obesity   ? Palpitations   ? Sleep apnea 09/14/2004  ? [facility]- Dr. Humphrey Rolls; CPAP titration 11/12/04- Dr. Humphrey Rolls  ?  ?No Known Allergies ? ? ?Review of Systems: ? No headache, visual changes, nausea, vomiting, diarrhea, constipation, dizziness, abdominal pain, skin rash, fevers, chills, night sweats, weight loss, swollen lymph nodes, body aches, joint swelling, chest pain, shortness of breath, mood changes. POSITIVE muscle aches ? ?Objective  ?Blood pressure 130/88, pulse 79, height '6\' 3"'$  (1.905 m), weight 256 lb (116.1 kg), SpO2 97 %. ?  ?General: No apparent distress alert and oriented x3 mood and affect normal, dressed appropriately.  ?HEENT: Pupils equal, extraocular movements intact  ?Respiratory:  Patient's speak in full sentences and does not appear short of breath  ?Cardiovascular: No lower extremity edema, non tender, no erythema  ?Mild tightness in the lumbar spine noted.  Mild tightness with right-sided sacroiliac joint but otherwise fairly unremarkable.  Still mild tightness with FABER test on the right compared to left ? ?Osteopathic findings ? ?T7 extended rotated and side bent left ?L1 flexed rotated and side bent right ?Sacrum right on right ? ? ? ? ?  ?Assessment and Plan: ? ?Degenerative disc disease, lumbar ?Patient does have some loss lordosis still.  Patient now is making progress.  He does need to continue to work on core strengthening but do believe the patient will do well.  Follow-up with me again in 6 weeks  ? ?Nonallopathic problems ? ?Decision today to treat with OMT was based on Physical Exam ? ?After verbal consent patient was treated with HVLA, ME, FPR techniques in  thoracic, lumbar, and sacral  areas ? ?Patient tolerated the procedure well with improvement in symptoms ? ?Patient given exercises, stretches and lifestyle modifications ? ?See medications in patient instructions if given ? ?Patient will follow up in 4-8 weeks ? ?  ? ?The above documentation has been reviewed and is accurate and complete [name], DO ? ? ? ?  ? ? Note: This dictation was prepared with Dragon dictation along with smaller phrase technology. Any transcriptional errors that result from this process are unintentional.    ?  ?  ? ?

## 2021-04-08 ENCOUNTER — Other Ambulatory Visit: Payer: Self-pay

## 2021-04-08 ENCOUNTER — Other Ambulatory Visit: Payer: Self-pay | Admitting: Cardiovascular Disease

## 2021-04-08 ENCOUNTER — Ambulatory Visit: Payer: Medicare PPO | Admitting: Family Medicine

## 2021-04-08 VITALS — BP 130/88 | HR 79 | Ht 75.0 in | Wt 256.0 lb

## 2021-04-08 DIAGNOSIS — M9904 Segmental and somatic dysfunction of sacral region: Secondary | ICD-10-CM | POA: Diagnosis not present

## 2021-04-08 DIAGNOSIS — M9902 Segmental and somatic dysfunction of thoracic region: Secondary | ICD-10-CM | POA: Diagnosis not present

## 2021-04-08 DIAGNOSIS — M5136 Other intervertebral disc degeneration, lumbar region: Secondary | ICD-10-CM

## 2021-04-08 DIAGNOSIS — M9903 Segmental and somatic dysfunction of lumbar region: Secondary | ICD-10-CM

## 2021-04-08 NOTE — Assessment & Plan Note (Signed)
Patient does have some loss lordosis still.  Patient now is making progress.  He does need to continue to work on core strengthening but do believe the patient will do well.  Follow-up with me again in 6 weeks ?

## 2021-04-08 NOTE — Patient Instructions (Signed)
See me again in 5-6 weeks ?So happy you are doing well! ?

## 2021-04-10 ENCOUNTER — Ambulatory Visit (INDEPENDENT_AMBULATORY_CARE_PROVIDER_SITE_OTHER): Payer: Medicare PPO

## 2021-04-10 ENCOUNTER — Other Ambulatory Visit: Payer: Self-pay

## 2021-04-10 DIAGNOSIS — Z0000 Encounter for general adult medical examination without abnormal findings: Secondary | ICD-10-CM

## 2021-04-10 NOTE — Progress Notes (Signed)
? ?Subjective:  ? Clayton [name] is a 76 y.o. male who presents for an Initial Medicare Annual Wellness Visit. I connected with  [name] on 04/10/21 by a audio enabled telemedicine application and verified that I am speaking with the correct person using two identifiers. ? ?Patient Location: Home ? ?Provider Location: Home Office ? ?I discussed the limitations of evaluation and management by telemedicine. The patient expressed understanding and agreed to proceed.  ? ?Review of Systems    ?Defer to PCP ?Cardiac Risk Factors include: advanced age (>53mn, >>59women);family history of premature cardiovascular disease;hypertension;male gender;obesity (BMI >30kg/m2) ? ?   ?Objective:  ?  ?There were no vitals filed for this visit. ?There is no height or weight on file to calculate BMI. ? ? ?  04/10/2021  ?  4:24 PM 03/03/2021  ?  7:43 AM 03/03/2021  ?  7:34 AM 03/03/2021  ?  7:33 AM 09/10/2020  ?  7:18 AM 08/12/2020  ?  2:13 PM 08/06/2020  ? 11:09 AM  ?Advanced Directives  ?Does Patient Have a Medical Advance Directive? Yes  Yes Yes Yes Yes Yes  ?Type of Advance Directive Living will;Healthcare Power of [place] Healthcare [place] of [place] will [place]Living will [place] Hill[place] will  ?Does patient want to make changes to medical advance directive? No - Patient declined     No - Patient declined   ?Copy of HRamerin Chart? No - copy requested Yes - validated most recent copy scanned in chart (See row information)  No - copy requested  No - copy requested No - copy requested  ? ? ?Current Medications (verified) ?Outpatient Encounter Medications as of 04/10/2021  ?Medication Sig  ? amLODipine (NORVASC) 5 MG tablet Take 5 mg by mouth daily.  ? Coenzyme Q10 200 MG capsule Take 200 mg by mouth in the morning.  ? ELIQUIS 5 MG TABS tablet TAKE 1 TABLET BY  MOUTH TWICE A DAY  ? ezetimibe (ZETIA) 10 MG tablet Take 1 tablet (10 mg total) by mouth in the morning.  ? metFORMIN (GLUCOPHAGE) 500 MG tablet Take 1 tablet (500 mg total) by mouth daily with breakfast.  ? metoprolol succinate (TOPROL-XL) 50 MG 24 hr tablet Take 50 mg by mouth 2 (two) times daily. Take twice a day.  ? Multiple Vitamins-Minerals (MULTIVITAMIN WITH MINERALS) tablet Take 1 tablet by mouth daily.  ? olmesartan (BENICAR) 40 MG tablet TAKE 1 TABLET BY MOUTH EVERY DAY (Patient taking differently: Take 40 mg by mouth every evening.)  ? Pitavastatin Calcium (LIVALO) 2 MG TABS Take 1 tablet (2 mg total) by mouth every evening.  ? spironolactone (ALDACTONE) 25 MG tablet TAKE 1/2 TABLET BY MOUTH EVERY DAY  ? tamsulosin (FLOMAX) 0.4 MG CAPS capsule Take 0.4 mg by mouth in the morning.  ? ?No facility-administered encounter medications on file as of 04/10/2021.  ? ? ?Allergies (verified) ?Patient has no known allergies.  ? ?History: ?Past Medical History:  ?Diagnosis Date  ? Arthritis   ? hips   ? [place]   ? Cataract   ? Cochlear Meniere syndrome of left ear 06/09/2015  ? CPAP (continuous positive airway pressure) dependence   ? Dysrhythmia   ? PAF- hx of 7 years ago   ? HLD (hyperlipidemia)   ? Hypertension 05/29/2010  ? ECHO-EF 67% wnl  ? Meniere disease   ?  Obesity   ? Palpitations   ? Sleep apnea 09/14/2004  ? [facility]- Dr. Humphrey Rolls; CPAP titration 11/12/04- Dr. Humphrey Rolls  ? ?Past Surgical History:  ?Procedure Laterality Date  ? ATRIAL FIBRILLATION ABLATION N/A 09/10/2020  ? Procedure: ATRIAL FIBRILLATION ABLATION;  Surgeon: Constance Haw, MD;  Location: [facility];  Service: Cardiovascular;  Laterality: N/A;  ? CARDIAC CATHETERIZATION  11/03/2005  ? CARDIOVERSION N/A 09/26/2018  ? Procedure: CARDIOVERSION;  Surgeon: Geralynn Rile, MD;  Location: [facility];  Service: Endoscopy;  Laterality: N/A;  ? CARDIOVERSION N/A 03/13/2019  ? Procedure: CARDIOVERSION;   Surgeon: Skeet Latch, MD;  Location: [facility];  Service: Cardiovascular;  Laterality: N/A;  ? CARDIOVERSION N/A 03/03/2021  ? Procedure: CARDIOVERSION;  Surgeon: Josue Hector, MD;  Location: [facility]COPY;  Service: Cardiovascular;  Laterality: N/A;  ? CATARACT EXTRACTION  01/19/2012  ? KNEE ARTHROSCOPY    ? left knee- 2002   ? TOTAL HIP ARTHROPLASTY  06/08/2011  ? Procedure: TOTAL HIP ARTHROPLASTY ANTERIOR APPROACH;  Surgeon: [name], MD;  [facility];  Service: Orthopedics;  Laterality: Left;  ? TOTAL HIP ARTHROPLASTY Right 08/12/2020  ? ANTERIOR APPROACH  ? TOTAL HIP ARTHROPLASTY Right 08/12/2020  ? Procedure: TOTAL HIP ARTHROPLASTY ANTERIOR APPROACH;  Surgeon: Paralee Cancel, MD;  [facility];  Service: Orthopedics;  Laterality: Right;  ? ?Family History  ?Problem Relation Age of Onset  ? Hypertension Mother   ? Stroke Mother   ? Diabetes Mother   ? Hyperlipidemia Mother   ? Obesity Mother   ? Diabetes Sister   ? Stroke Maternal Grandmother   ?     71 died   ? Heart disease Maternal Grandfather   ? Hypertension Maternal Grandfather   ? Diabetes Maternal Grandfather   ? Arthritis Father   ? Heart disease Father   ? Heart disease Paternal Grandfather   ? ?Social History  ? ?Socioeconomic History  ? Marital status: Married  ?  Spouse name: Jagar Lua  ? Number of children: Not on file  ? Years of education: Not on file  ? Highest education level: Not on file  ?Occupational History  ? Occupation: Retired  ?Tobacco Use  ? Smoking status: Never  ? Smokeless tobacco: Never  ?Vaping Use  ? Vaping Use: Never used  ?Substance and Sexual Activity  ? Alcohol use: No  ? Drug use: No  ? Sexual activity: Yes  ?  Birth control/protection: None  ?Other Topics Concern  ? Not on file  ?Social History Narrative  ? Married. [name].Takes care of his mother-in-law as well.   ? Retired, Conservator, museum/gallery.   ? Drinks caffeine beverages. Takes a daily vitamin.  ? Exercises routinely.   ? Smoke detector in  the home.   ? Ambulates independently.   ? ?Social Determinants of Health  ? ?Financial Resource Strain: Low Risk   ? Difficulty of Paying Living Expenses: Not hard at all  ?Food Insecurity: No Food Insecurity  ? Worried About Charity fundraiser in the Last Year: Never true  ? Ran Out of Food in the Last Year: Never true  ?Transportation Needs: No Transportation Needs  ? Lack of Transportation (Medical): No  ? Lack of Transportation (Non-Medical): No  ?Physical Activity: Sufficiently Active  ? Days of Exercise per Week: 7 days  ? Minutes of Exercise per Session: 50 min  ?Stress: No Stress Concern Present  ? Feeling of Stress : Not at all  ?  Social Connections: Moderately Isolated  ? Frequency of Communication with Friends and Family: More than three times a week  ? Frequency of Social Gatherings with Friends and Family: Once a week  ? Attends Religious Services: Never  ? Active Member of Clubs or Organizations: No  ? Attends Archivist Meetings: Never  ? Marital Status: Married  ? ? ?Tobacco Counseling ?Counseling given: No ? ? ?Clinical Intake: ? ?Pre-visit preparation completed: No ? ?Pain : 0-10 ?Pain Type: Chronic pain ?Pain Location: Back ?Pain Orientation: Lower ? ?  ? ?BMI - recorded: 32 ?Nutritional Status: BMI > 30  Obese ?Nutritional Risks: None ?Diabetes: No ? ?How often do you need to have someone help you when you read instructions, pamphlets, or other written materials from your doctor or pharmacy?: 1 - Never ?What is the last grade level you completed in school?: master ? ?Diabetic?No ? ?Interpreter Needed?: No ? ?Information entered by :: Kavin Leech ? ? ?Activities of Daily Living ? ?  04/10/2021  ?  4:28 PM 08/12/2020  ?  2:15 PM  ?In your present state of health, do you have any difficulty performing the following activities:  ?Hearing? 1   ?Vision? 0   ?Difficulty concentrating or making decisions? 0   ?Walking or climbing stairs? 0   ?Dressing or bathing? 0   ?Doing errands, shopping?  0 0  ?Preparing Food and eating ? N   ?Using the Toilet? N   ?In the past six months, have you accidently leaked urine? N   ?Do you have problems with loss of bowel control? N   ?Managing your Medica

## 2021-04-10 NOTE — Patient Instructions (Signed)
?  [name] , ?Thank you for taking time to come for your Medicare Wellness Visit. I appreciate your ongoing commitment to your health goals. Please review the following plan we discussed and let me know if I can assist you in the future.  ? ?These are the goals we discussed: ? Goals   ? ?  Activity and Exercise Increased   ?  Evidence-based guidance:  ?Review current exercise levels.  ?Assess patient perspective on exercise or activity level, barriers to increasing activity, motivation and readiness for change.  ?Recommend or set healthy exercise goal based on individual tolerance.  ?Encourage small steps toward making change in amount of exercise or activity.  ?Urge reduction of sedentary activities or screen time.  ?Promote group activities within the community or with family or support person.  ?Consider referral to rehabiliation therapist for assessment and exercise/activity plan.   ?Notes:  ?  ?  Blood Pressure < 140/90   ?  Weight (lb) < 200 lb (90.7 kg)   ? ?  ?  ?This is a list of the screening recommended for you and due dates:  ?Health Maintenance  ?Topic Date Due  ? Pneumonia Vaccine (1 - PCV) Never done  ? Hepatitis C Screening: USPSTF Recommendation to screen - Ages 43-79 yo.  Never done  ? Colon Cancer Screening  Never done  ? Flu Shot  04/17/2021*  ? COVID-19 Vaccine (2 - Moderna risk series) 04/26/2021*  ? Zoster (Shingles) Vaccine (1 of 2) 07/11/2021*  ? Tetanus Vaccine  04/11/2022*  ? HPV Vaccine  Aged Out  ?*Topic was postponed. The date shown is not the original due date.  ?  ?

## 2021-04-12 ENCOUNTER — Other Ambulatory Visit: Payer: Self-pay | Admitting: Cardiovascular Disease

## 2021-04-25 ENCOUNTER — Other Ambulatory Visit: Payer: Self-pay | Admitting: Physician Assistant

## 2021-04-25 ENCOUNTER — Other Ambulatory Visit: Payer: Self-pay | Admitting: Family Medicine

## 2021-04-25 ENCOUNTER — Other Ambulatory Visit: Payer: Self-pay | Admitting: Cardiovascular Disease

## 2021-04-27 ENCOUNTER — Ambulatory Visit: Payer: Medicare PPO | Admitting: Psychologist

## 2021-04-29 ENCOUNTER — Ambulatory Visit: Payer: Medicare PPO | Admitting: Psychologist

## 2021-05-05 ENCOUNTER — Ambulatory Visit (INDEPENDENT_AMBULATORY_CARE_PROVIDER_SITE_OTHER): Payer: Medicare PPO | Admitting: Family Medicine

## 2021-05-12 DIAGNOSIS — D3617 Benign neoplasm of peripheral nerves and autonomic nervous system of trunk, unspecified: Secondary | ICD-10-CM | POA: Diagnosis not present

## 2021-05-12 DIAGNOSIS — D2261 Melanocytic nevi of right upper limb, including shoulder: Secondary | ICD-10-CM | POA: Diagnosis not present

## 2021-05-12 DIAGNOSIS — D225 Melanocytic nevi of trunk: Secondary | ICD-10-CM | POA: Diagnosis not present

## 2021-05-12 DIAGNOSIS — D1801 Hemangioma of skin and subcutaneous tissue: Secondary | ICD-10-CM | POA: Diagnosis not present

## 2021-05-12 DIAGNOSIS — L821 Other seborrheic keratosis: Secondary | ICD-10-CM | POA: Diagnosis not present

## 2021-05-13 NOTE — Progress Notes (Signed)
?Clayton [name] D.O. ?[facility] ?[place] ?Phone: [phone] ?Subjective:   ?I, Clayton [name], am serving as a Education administrator for Dr. Hulan Saas. ?This visit occurred during the SARS-CoV-2 public health emergency.  Safety protocols were in place, including screening questions prior to the visit, additional usage of staff PPE, and extensive cleaning of exam room while observing appropriate contact time as indicated for disinfecting solutions.  ? ?I'm seeing this patient by the request  of:  Kuneff, Renee A, DO ? ?CC: Low back pain ? ?[id number]  ?Clayton [name] is a 76 y.o. male coming in with complaint of back and neck pain. OMT on 04/08/2021. Patient states same per usual. No new complaints. Just wanted to check in with left knee. ? ?Medications patient has been prescribed: Kenalog ? ?Taking: ? ? ?  ? ? ? ? ?Past Medical History:  ?Diagnosis Date  ? Arthritis   ? hips   ? Atrial fibrillation ([place])   ? Cataract   ? Cochlear Meniere syndrome of left ear 06/09/2015  ? CPAP (continuous positive airway pressure) dependence   ? Dysrhythmia   ? PAF- hx of 7 years ago   ? HLD (hyperlipidemia)   ? Hypertension 05/29/2010  ? ECHO-EF 67% wnl  ? Meniere disease   ? Obesity   ? Palpitations   ? Sleep apnea 09/14/2004  ? [facility]- Dr. Humphrey Rolls; CPAP titration 11/12/04- Dr. Humphrey Rolls  ?  ?No Known Allergies ? ? ?Review of Systems: ? No headache, visual changes, nausea, vomiting, diarrhea, constipation, dizziness, abdominal pain, skin rash, fevers, chills, night sweats, weight loss, swollen lymph nodes, body aches, joint swelling, chest pain, shortness of breath, mood changes. POSITIVE muscle aches ? ?Objective  ?Blood pressure (!) 142/92, pulse 86, height '6\' 3"'$  (1.905 m), weight 260 lb (117.9 kg), SpO2 97 %. ?  ?General: No apparent distress alert and oriented x3 mood and affect normal, dressed appropriately.  ?HEENT: Pupils equal, extraocular movements intact  ?Respiratory:  Patient's speak in full sentences and does not appear short of breath  ?Cardiovascular: No lower extremity edema, non tender, no erythema  ?Low back exam does have tightness noted.  Some mild tightness with FABER test still noted.  Arthritic changes of the left knee but no significant swelling. ? ?Osteopathic findings ? ?C2 flexed rotated and side bent right ?C6 flexed rotated and side bent left ?T8 extended rotated and side bent left ?L2 flexed rotated and side bent right ?Sacrum right on right ? ? ? ? ?  ?Assessment and Plan: ? ?Degenerative disc disease, lumbar ?DDD noted, patient does have some loss of lordosis.  Some tenderness to palpation in the paraspinal musculature.  Patient is going to continue to work on the core strengthening and weight loss.  Follow-up again in 6 weeks  ? ?Nonallopathic problems ? ?Decision today to treat with OMT was based on Physical Exam ? ?After verbal consent patient was treated with HVLA, ME, FPR techniques in cervical,  thoracic, lumbar, and sacral  areas ? ?Patient tolerated the procedure well with improvement in symptoms ? ?Patient given exercises, stretches and lifestyle modifications ? ?See medications in patient instructions if given ? ?Patient will follow up in 4-8 weeks ? ?  ? ?The above documentation has been reviewed and is accurate and complete [name], DO ? ? ? ?  ? ? Note: This dictation was prepared with Dragon dictation along with smaller phrase technology. Any transcriptional errors that result  from this process are unintentional.    ?  ?  ? ?

## 2021-05-14 ENCOUNTER — Ambulatory Visit: Payer: Medicare PPO | Admitting: Family Medicine

## 2021-05-14 VITALS — BP 142/92 | HR 86 | Ht 75.0 in | Wt 260.0 lb

## 2021-05-14 DIAGNOSIS — M9904 Segmental and somatic dysfunction of sacral region: Secondary | ICD-10-CM

## 2021-05-14 DIAGNOSIS — M9903 Segmental and somatic dysfunction of lumbar region: Secondary | ICD-10-CM | POA: Diagnosis not present

## 2021-05-14 DIAGNOSIS — M51369 Other intervertebral disc degeneration, lumbar region without mention of lumbar back pain or lower extremity pain: Secondary | ICD-10-CM

## 2021-05-14 DIAGNOSIS — M9902 Segmental and somatic dysfunction of thoracic region: Secondary | ICD-10-CM | POA: Diagnosis not present

## 2021-05-14 DIAGNOSIS — M5136 Other intervertebral disc degeneration, lumbar region: Secondary | ICD-10-CM

## 2021-05-14 DIAGNOSIS — M9901 Segmental and somatic dysfunction of cervical region: Secondary | ICD-10-CM | POA: Diagnosis not present

## 2021-05-14 NOTE — Patient Instructions (Signed)
Good to see you! See you again in 6 weeks 

## 2021-05-14 NOTE — Assessment & Plan Note (Signed)
DDD noted, patient does have some loss of lordosis.  Some tenderness to palpation in the paraspinal musculature.  Patient is going to continue to work on the core strengthening and weight loss.  Follow-up again in 6 weeks ?

## 2021-05-26 ENCOUNTER — Encounter (INDEPENDENT_AMBULATORY_CARE_PROVIDER_SITE_OTHER): Payer: Self-pay | Admitting: Family Medicine

## 2021-05-26 ENCOUNTER — Ambulatory Visit (INDEPENDENT_AMBULATORY_CARE_PROVIDER_SITE_OTHER): Payer: Medicare PPO | Admitting: Family Medicine

## 2021-05-26 VITALS — BP 174/93 | HR 74 | Temp 98.0°F | Ht 75.0 in | Wt 253.0 lb

## 2021-05-26 DIAGNOSIS — I10 Essential (primary) hypertension: Secondary | ICD-10-CM | POA: Diagnosis not present

## 2021-05-26 DIAGNOSIS — E669 Obesity, unspecified: Secondary | ICD-10-CM | POA: Diagnosis not present

## 2021-05-26 DIAGNOSIS — Z6831 Body mass index (BMI) 31.0-31.9, adult: Secondary | ICD-10-CM | POA: Diagnosis not present

## 2021-05-26 DIAGNOSIS — E66812 Obesity, class 2: Secondary | ICD-10-CM

## 2021-06-03 IMAGING — DX DG KNEE 3 VIEWS*L*
3 series · 3 of 3 positions shown · non-contrast
Comparison: None.

CLINICAL DATA: Generalized left knee pain. Chronic pain, worsening.

EXAM:
LEFT KNEE - 3 VIEW

[knee ap]
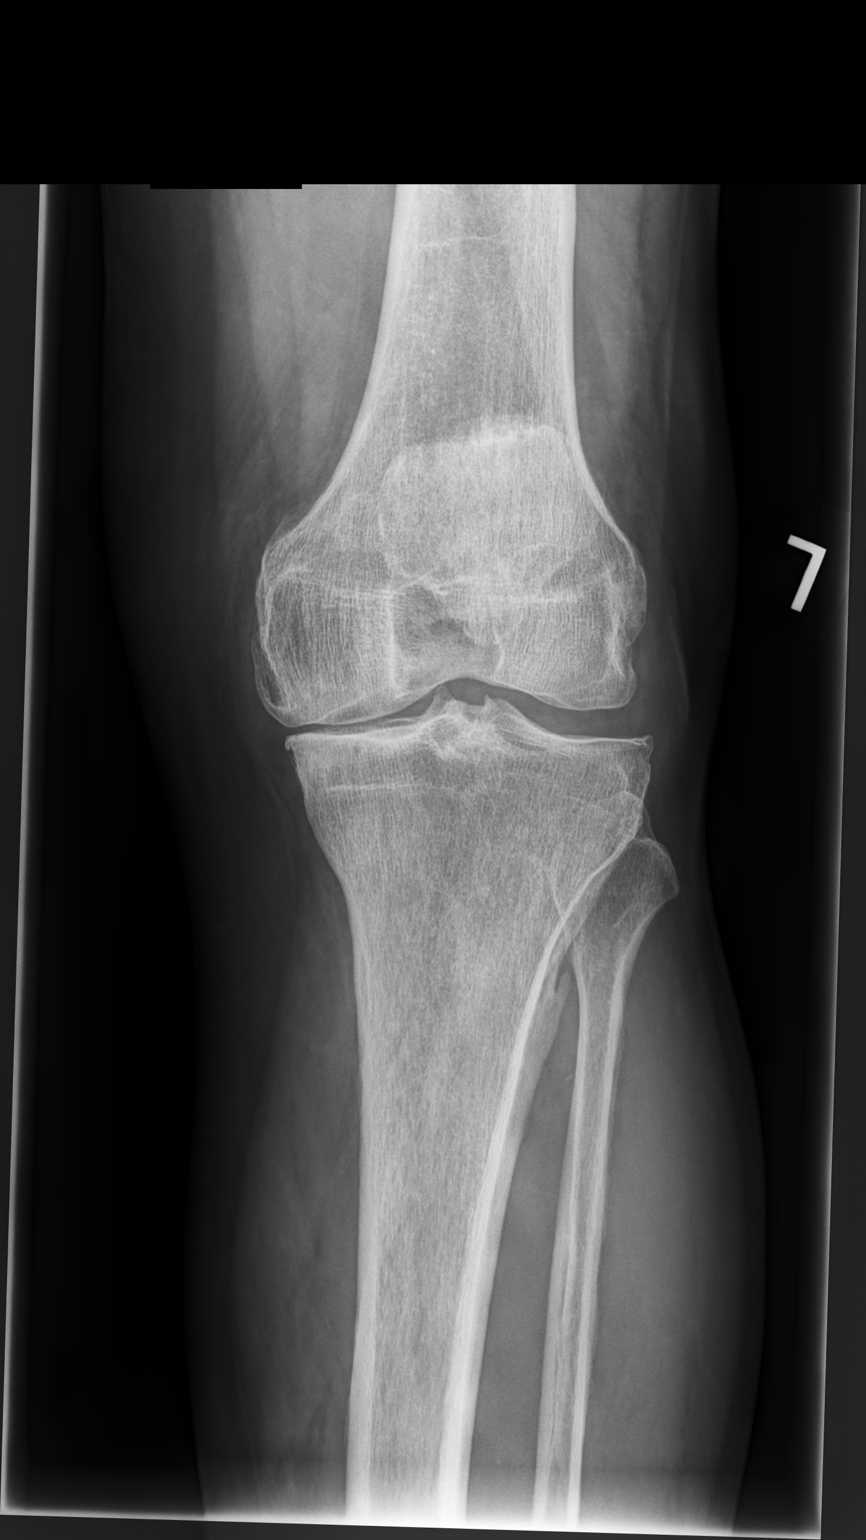

[knee lat]
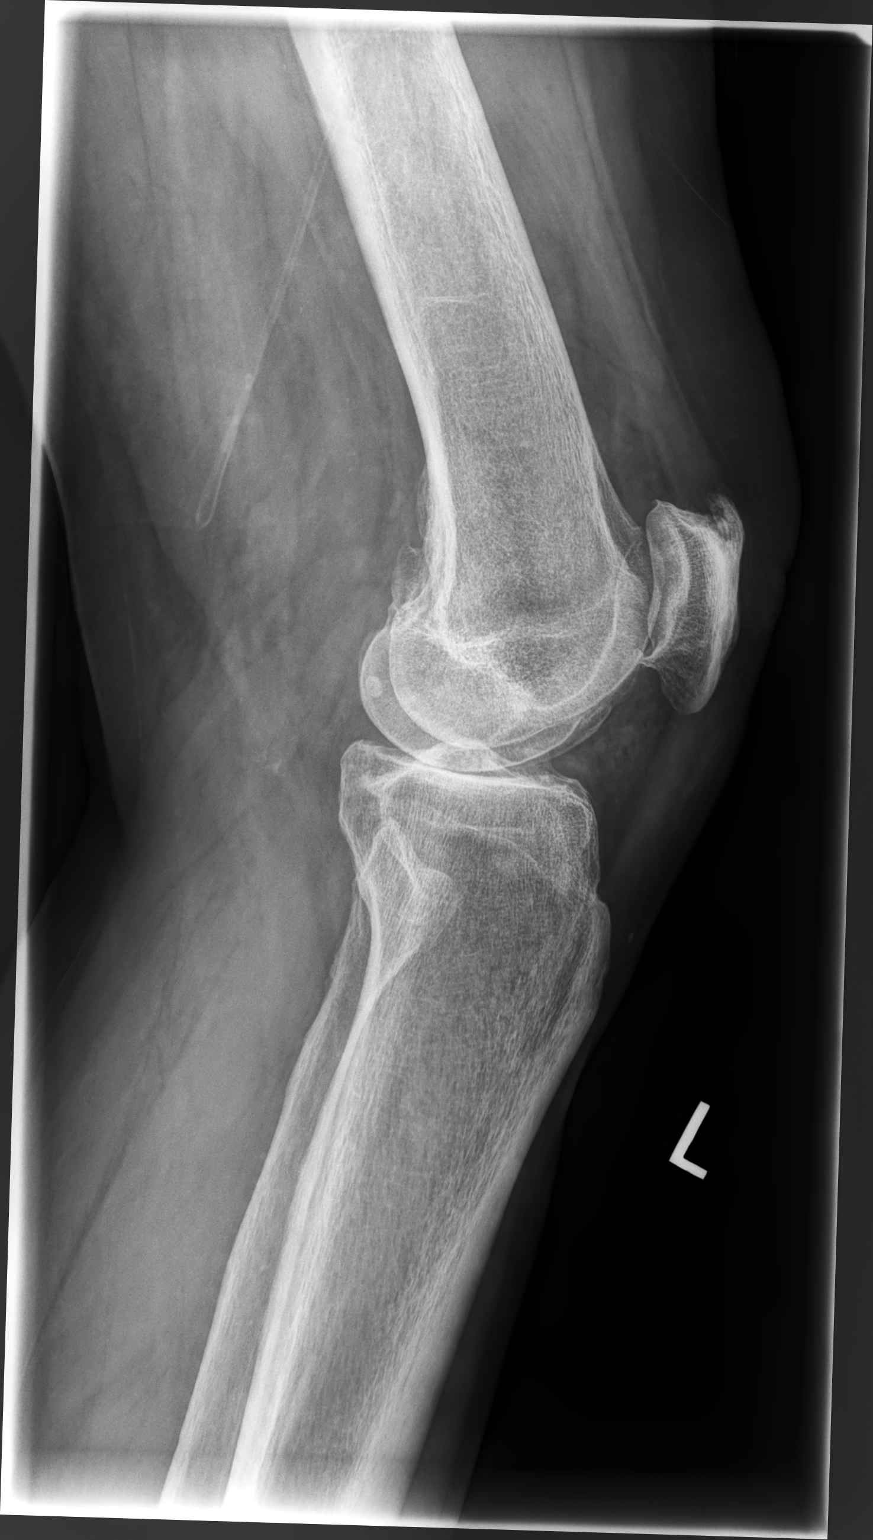

[patella (sunrise) tan]
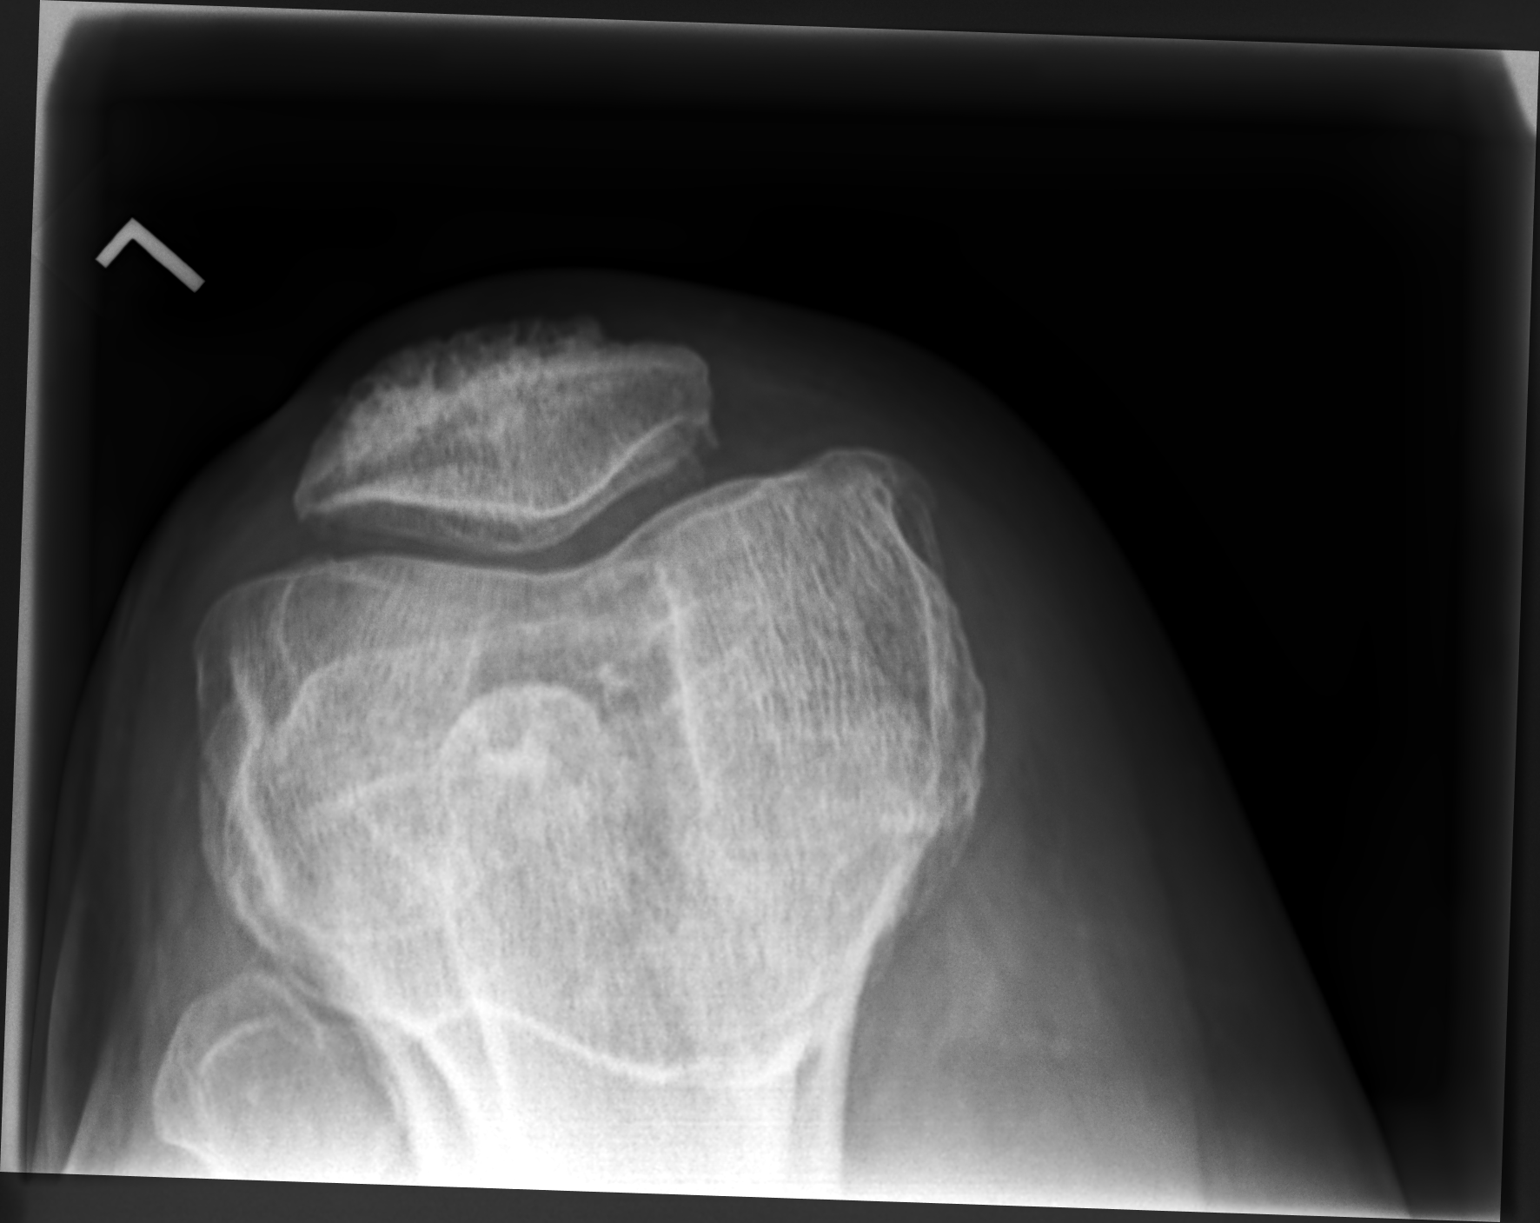

[3 of 3 positions shown; findings below may reference images not displayed]

FINDINGS: AP view obtained standing. No fracture or dislocation. Moderate
medial tibiofemoral joint space narrowing. Mild tricompartmental
peripheral spurring. There is a quadriceps tendon enthesophyte.
Small knee joint effusion. No erosion or bony destruction. No
evidence of focal bone lesion. Soft tissues are unremarkable.
IMPRESSION: Osteoarthritis, moderate in the medial tibiofemoral compartment.
Small joint effusion.

## 2021-06-10 NOTE — Progress Notes (Signed)
Chief Complaint:   OBESITY Clayton [name] is here to discuss his progress with his obesity treatment plan along with follow-up of his obesity related diagnoses. Clayton [name] is on following a lower carbohydrate, vegetable and lean protein rich diet plan and states he is following his eating plan approximately 50% of the time. [name] states he is walking 2 miles, and biking riding for 30 minutes 4 times per week.  Today's visit was #: 51 Starting weight: 266 lbs Starting date: 11/01/2017 Today's weight: 253 lbs Today's date: 05/26/2021 Total lbs lost to date: 13 Total lbs lost since last in-office visit: 0  Interim History: Clayton [name] has been off his routine in the last couple of months. He gained approximately 8 lbs, but has already getting back on track. He would like to go back to journaling.  Subjective:   1. Essential hypertension Clayton [name]'s blood pressure is higher than normal today. His Norvasc is at 5 mg. He has had increased Na+ recently. He is followed by Cardiology. He denies chest pain or shortness of breath.  Assessment/Plan:   1. Essential hypertension Amor will continue to work on his diet, exercise, and weight loss. We will recheck his blood pressure in 4-5 weeks.   2. Obesity, Current BMI 31.7 Clayton [name] is currently in the action stage of change. As such, his goal is to continue with weight loss efforts. He has agreed to keeping a food journal and adhering to recommended goals of 1500-1800 calories and 100+ grams of protein daily (total carbs below 50 grams).   Exercise goals: As is.   Behavioral modification strategies: increasing lean protein intake.  Clayton [name] has agreed to follow-up with our clinic in 3 to 4 weeks. He was informed of the importance of frequent follow-up visits to maximize his success with intensive lifestyle modifications for his multiple health conditions.   Objective:   Blood pressure (!) 174/93, pulse 74, temperature 98 F (36.7 C), height '6\' 3"'$  (1.905 m),  weight 253 lb (114.8 kg), SpO2 95 %. Body mass index is 31.62 kg/m.  General: Cooperative, alert, well developed, in no acute distress. HEENT: Conjunctivae and lids unremarkable. Cardiovascular: Regular rhythm.  Lungs: Normal work of breathing. Neurologic: No focal deficits.   Lab Results  Component Value Date   CREATININE 1.56 (H) 02/13/2021   BUN 33 (H) 02/13/2021   NA 141 02/13/2021   K 4.9 02/13/2021   CL 107 (H) 02/13/2021   CO2 21 02/13/2021   Lab Results  Component Value Date   ALT 27 08/12/2020   AST 23 08/12/2020   ALKPHOS 44 08/12/2020   BILITOT 0.8 08/12/2020   Lab Results  Component Value Date   HGBA1C 5.8 (H) 07/04/2020   HGBA1C 5.8 (H) 08/01/2019   HGBA1C 5.8 (H) 09/04/2018   HGBA1C 6.0 (H) 11/01/2017   HGBA1C 5.4 04/14/2016   Lab Results  Component Value Date   INSULIN 3.9 08/01/2019   INSULIN 9.0 09/04/2018   INSULIN 22.4 11/01/2017   Lab Results  Component Value Date   TSH 2.140 08/01/2020   Lab Results  Component Value Date   CHOL 120 08/01/2020   HDL 54 08/01/2020   LDLCALC 50 08/01/2020   TRIG 81 08/01/2020   CHOLHDL 2.2 08/01/2020   Lab Results  Component Value Date   VD25OH 39.95 01/24/2020   VD25OH 37.6 08/01/2019   VD25OH 43.2 09/04/2018   Lab Results  Component Value Date   WBC 7.3 02/13/2021   HGB 15.3 02/13/2021   HCT 46.2 02/13/2021  MCV 92 02/13/2021   PLT 189 02/13/2021   Lab Results  Component Value Date   IRON 71 01/24/2020   FERRITIN 77.1 01/24/2020   Attestation Statements:   Reviewed by clinician on day of visit: allergies, medications, problem list, medical history, surgical history, family history, social history, and previous encounter notes.  Time spent on visit including pre-visit chart review and post-visit care and charting was 35 minutes.   I, Trixie Dredge, am acting as transcriptionist for Dennard Nip, MD.  I have reviewed the above documentation for accuracy and completeness, and I agree  with the above. -  Dennard Nip, MD

## 2021-06-24 NOTE — Progress Notes (Signed)
[facility] [place] Phone: [phone] Subjective:   [name], am serving as a scribe for Dr. Hulan Saas.   I'm seeing this patient by the request  of:  Kuneff, Renee A, DO  CC: Neck and back pain follow-up   [id number]  Clayton [name] is a 76 y.o. male coming in with complaint of back and neck pain. OMT on 05/14/2021. Patient states that he has been doing well. Would like to consider injection in L knee again. Last one per patient is 14 months ago.   Medications patient has been prescribed: Kenalog  Taking:         Reviewed prior external information including notes and imaging from previsou exam, outside providers and external EMR if available.   As well as notes that were available from care everywhere and other healthcare systems.  Reviewed healthy weight and wellness visit recently.  Patient is down 13 pounds.  Past medical history, social, surgical and family history all reviewed in electronic medical record.  No pertanent information unless stated regarding to the chief complaint.   Past Medical History:  Diagnosis Date   Arthritis    hips    Atrial fibrillation ([facility])    Cataract    Cochlear Meniere syndrome of left ear 06/09/2015   CPAP (continuous positive airway pressure) dependence    Dysrhythmia    PAF- hx of 7 years ago    HLD (hyperlipidemia)    Hypertension 05/29/2010   ECHO-EF 67% wnl   Meniere disease    Obesity    Palpitations    Sleep apnea 09/14/2004   [facility]- Dr. Humphrey Rolls; CPAP titration 11/12/04- Dr. Humphrey Rolls    No Known Allergies   Review of Systems:  No headache, visual changes, nausea, vomiting, diarrhea, constipation, dizziness, abdominal pain, skin rash, fevers, chills, night sweats, weight loss, swollen lymph nodes, body aches, joint swelling, chest pain, shortness of breath, mood changes. POSITIVE muscle aches  Objective  Blood pressure (!)  150/90, pulse 80, height '6\' 3"'$  (1.905 m), weight 268 lb (121.6 kg), SpO2 97 %.   General: No apparent distress alert and oriented x3 mood and affect normal, dressed appropriately.  HEENT: Pupils equal, extraocular movements intact  Respiratory: Patient's speak in full sentences and does not appear short of breath  Cardiovascular: No lower extremity edema, non tender, no erythema  Left knee exam does have a trace effusion noted.  Patient does have tenderness to palpation over the patellofemoral joint.  Lacks last 10 degrees of flexion.  Low back exam does have some loss of lordosis.  Patient does have tightness with FABER test bilaterally.  Negative straight leg test.  Osteopathic findings  C2 flexed rotated and side bent right C6 flexed rotated and side bent left T9 extended rotated and side bent left L2 flexed rotated and side bent right Sacrum right on right  After informed written and verbal consent, patient was seated on exam table. Left knee was prepped with alcohol swab and utilizing anterolateral approach, patient's left knee space was injected with 4:1  marcaine 0.5%: Kenalog '40mg'$ /dL. Patient tolerated the procedure well without immediate complications.     Assessment and Plan:  Degenerative arthritis of left knee Patient given steroid injection and tolerated the procedure well.  We will have patient set up again in 6 to 8 weeks.  May consider the possibility of viscosupplementation.  Has responded to this in the past and we will  see how patient does.  Increase activity slowly otherwise.  Follow-up again in 8 to 12 weeks  Degenerative disc disease, lumbar Degenerative disc disease.  Discussed which activities to do and which ones to avoid, increase activity slowly.  Discussed icing regimen and home exercises.  Increase activity slowly.  Follow-up again in 6 to 8 weeks responding well to manipulation.    Nonallopathic problems  Decision today to treat with OMT was based on  Physical Exam  After verbal consent patient was treated with HVLA, ME, FPR techniques in cervical, thoracic, lumbar, and sacral  areas  Patient tolerated the procedure well with improvement in symptoms  Patient given exercises, stretches and lifestyle modifications  See medications in patient instructions if given  Patient will follow up in 6 weeks      The above documentation has been reviewed and is accurate and complete [name], DO        Note: This dictation was prepared with Dragon dictation along with smaller phrase technology. Any transcriptional errors that result from this process are unintentional.

## 2021-06-25 ENCOUNTER — Encounter: Payer: Self-pay | Admitting: Family Medicine

## 2021-06-25 ENCOUNTER — Ambulatory Visit: Payer: Medicare PPO | Admitting: Family Medicine

## 2021-06-25 VITALS — BP 150/90 | HR 80 | Ht 75.0 in | Wt 268.0 lb

## 2021-06-25 DIAGNOSIS — M9902 Segmental and somatic dysfunction of thoracic region: Secondary | ICD-10-CM

## 2021-06-25 DIAGNOSIS — M9904 Segmental and somatic dysfunction of sacral region: Secondary | ICD-10-CM | POA: Diagnosis not present

## 2021-06-25 DIAGNOSIS — M9901 Segmental and somatic dysfunction of cervical region: Secondary | ICD-10-CM | POA: Diagnosis not present

## 2021-06-25 DIAGNOSIS — M5136 Other intervertebral disc degeneration, lumbar region: Secondary | ICD-10-CM | POA: Diagnosis not present

## 2021-06-25 DIAGNOSIS — M9903 Segmental and somatic dysfunction of lumbar region: Secondary | ICD-10-CM

## 2021-06-25 DIAGNOSIS — M51369 Other intervertebral disc degeneration, lumbar region without mention of lumbar back pain or lower extremity pain: Secondary | ICD-10-CM

## 2021-06-25 DIAGNOSIS — M1712 Unilateral primary osteoarthritis, left knee: Secondary | ICD-10-CM | POA: Diagnosis not present

## 2021-06-25 NOTE — Assessment & Plan Note (Signed)
Patient given steroid injection and tolerated the procedure well.  We will have patient set up again in 6 to 8 weeks.  May consider the possibility of viscosupplementation.  Has responded to this in the past and we will see how patient does.  Increase activity slowly otherwise.  Follow-up again in 8 to 12 weeks

## 2021-06-25 NOTE — Assessment & Plan Note (Signed)
Degenerative disc disease.  Discussed which activities to do and which ones to avoid, increase activity slowly.  Discussed icing regimen and home exercises.  Increase activity slowly.  Follow-up again in 6 to 8 weeks responding well to manipulation.

## 2021-06-25 NOTE — Patient Instructions (Signed)
See me again in 6-8 weeks ?

## 2021-06-28 ENCOUNTER — Other Ambulatory Visit (INDEPENDENT_AMBULATORY_CARE_PROVIDER_SITE_OTHER): Payer: Self-pay | Admitting: Family Medicine

## 2021-06-28 DIAGNOSIS — R7303 Prediabetes: Secondary | ICD-10-CM

## 2021-06-29 ENCOUNTER — Ambulatory Visit (INDEPENDENT_AMBULATORY_CARE_PROVIDER_SITE_OTHER): Payer: Medicare PPO | Admitting: Family Medicine

## 2021-07-02 ENCOUNTER — Telehealth: Payer: Self-pay | Admitting: Cardiology

## 2021-07-02 NOTE — Telephone Encounter (Signed)
  Per MyChart scheduling message:  Patient c/o Palpitations:  High priority if patient c/o lightheadedness, shortness of breath, or chest pain  How long have you had palpitations/irregular HR/ Afib? Are you having the symptoms now?   Are you currently experiencing lightheadedness, SOB or CP?   Do you have a history of afib (atrial fibrillation) or irregular heart rhythm?   Have you checked your BP or HR? (document readings if available):   Are you experiencing any other symptoms?   1.  Several weeks 2. Yes 3. Yes 4.  Yes 5. No.   I have been a patient of Dr Shonna Chock for several years.......if you look at my file you will be able to confirm all the above answers!?   I have an appt with Dr. Curt Bears in September (19th).   For this instance, I would like to see Clayton [name] to confirm A-fib before bothering Dr Curt Bears with an earlier appt.   I hope this clarifies my request.

## 2021-07-02 NOTE — Telephone Encounter (Signed)
Spoke to pt. Scheduled pt to be seen next week to further discuss. Pt agreeable to plan.

## 2021-07-08 ENCOUNTER — Ambulatory Visit: Payer: Medicare PPO | Admitting: Student

## 2021-07-08 ENCOUNTER — Encounter: Payer: Self-pay | Admitting: Student

## 2021-07-08 VITALS — BP 140/90 | HR 85 | Ht 75.0 in | Wt 261.6 lb

## 2021-07-08 DIAGNOSIS — I10 Essential (primary) hypertension: Secondary | ICD-10-CM | POA: Diagnosis not present

## 2021-07-08 DIAGNOSIS — G4733 Obstructive sleep apnea (adult) (pediatric): Secondary | ICD-10-CM

## 2021-07-08 DIAGNOSIS — I4819 Other persistent atrial fibrillation: Secondary | ICD-10-CM

## 2021-07-08 DIAGNOSIS — R5383 Other fatigue: Secondary | ICD-10-CM | POA: Diagnosis not present

## 2021-07-08 LAB — COMPREHENSIVE METABOLIC PANEL
ALT: 21 IU/L (ref 0–44)
AST: 24 IU/L (ref 0–40)
Albumin/Globulin Ratio: 1.9 (ref 1.2–2.2)
Albumin: 4.6 g/dL (ref 3.7–4.7)
Alkaline Phosphatase: 80 IU/L (ref 44–121)
BUN/Creatinine Ratio: 22 (ref 10–24)
BUN: 30 mg/dL — ABNORMAL HIGH (ref 8–27)
Bilirubin Total: 0.8 mg/dL (ref 0.0–1.2)
CO2: 21 mmol/L (ref 20–29)
Calcium: 10.4 mg/dL — ABNORMAL HIGH (ref 8.6–10.2)
Chloride: 104 mmol/L (ref 96–106)
Creatinine, Ser: 1.37 mg/dL — ABNORMAL HIGH (ref 0.76–1.27)
Globulin, Total: 2.4 g/dL (ref 1.5–4.5)
Glucose: 104 mg/dL — ABNORMAL HIGH (ref 70–99)
Potassium: 4.5 mmol/L (ref 3.5–5.2)
Sodium: 138 mmol/L (ref 134–144)
Total Protein: 7 g/dL (ref 6.0–8.5)
eGFR: 54 mL/min/{1.73_m2} — ABNORMAL LOW (ref >59)

## 2021-07-08 LAB — CBC
Hematocrit: 42 % (ref 37.5–51.0)
Hemoglobin: 13.8 g/dL (ref 13.0–17.7)
MCH: 29.1 pg (ref 26.6–33.0)
MCHC: 32.9 g/dL (ref 31.5–35.7)
MCV: 88 fL (ref 79–97)
Platelets: 198 10*3/uL (ref 150–450)
RBC: 4.75 x10E6/uL (ref 4.14–5.80)
RDW: 12.7 % (ref 11.6–15.4)
WBC: 7.3 10*3/uL (ref 3.4–10.8)

## 2021-07-08 LAB — TSH: TSH: 1.01 u[IU]/mL (ref 0.450–4.500)

## 2021-07-08 NOTE — Patient Instructions (Signed)
Medication Instructions:  Your physician recommends that you continue on your current medications as directed. Please refer to the Current Medication list given to you today. *If you need a refill on your cardiac medications before your next appointment, please call your pharmacy*   Lab Work: TODAY: CMET, CBC, TSH  If you have labs (blood work) drawn today and your tests are completely normal, you will receive your results only by: [facility] (if you have MyChart) OR A paper copy in the mail If you have any lab test that is abnormal or we need to change your treatment, we will call you to review the results.   Testing/Procedures: Your physician has requested that you have an echocardiogram. Echocardiography is a painless test that uses sound waves to create images of your heart. It provides your doctor with information about the size and shape of your heart and how well your heart's chambers and valves are working. This procedure takes approximately one hour. There are no restrictions for this procedure.   Follow-Up: At [facility], you and your health needs are our priority.  As part of our continuing mission to provide you with exceptional heart care, we have created designated Provider Care Teams.  These Care Teams include your primary Cardiologist (physician) and Advanced Practice Providers (APPs -  Physician Assistants and Nurse Practitioners) who all work together to provide you with the care you need, when you need it.   Your next appointment:   6 month(s)  The format for your next appointment:   In Person  Provider:   Allegra Lai, MD{

## 2021-07-08 NOTE — Progress Notes (Signed)
PCP:  Ma Hillock, DO Primary Cardiologist: Shelva Majestic, MD Electrophysiologist: Will Meredith Leeds, MD   Clayton [name] is a 76 y.o. male seen today for Will Meredith Leeds, MD for routine electrophysiology followup.    Since last being seen in our clinic the patient reports more fatigue and mild SOB over the past several months, mostly since trying to get back into exercising since the spring hit.  It varies, as last night he was able to ride a stationary bike for 30 minutes at "quite a pace" without difficulty. At other times, he gets fatigued and feels like yard work that should only take him 45 minutes takes him >2 hrs due to having to take breaks. He wears his BiPAP "religiously".    He has suffered from "Meniere disease", but he is not sure that he agrees with the diagnosis. He describes his symptoms as more of a "heavy" head, than a lightheadedness. When he moves his head in one direction he feels like it weighs 60 lbs and doesn't want to stop. He denies CP, syncope, or edema.   Past Medical History:  Diagnosis Date   Arthritis    hips    Atrial fibrillation (HCC)    Cataract    C[place] Meniere syndrome of left ear 06/09/2015   CPAP (continuous positive airway pressure) dependence    Dysrhythmia    PAF- hx of 7 years ago    HLD (hyperlipidemia)    Hypertension 05/29/2010   ECHO-EF 67% wnl   Meniere disease    Obesity    Palpitations    Sleep apnea 09/14/2004   [facility]- Dr. Humphrey Rolls; CPAP titration 11/12/04- Dr. Humphrey Rolls   Past Surgical History:  Procedure Laterality Date   ATRIAL FIBRILLATION ABLATION N/A 09/10/2020   Procedure: ATRIAL FIBRILLATION ABLATION;  Surgeon: Constance Haw, MD;  Location: [facility];  Service: Cardiovascular;  Laterality: N/A;   CARDIAC CATHETERIZATION  11/03/2005   CARDIOVERSION N/A 09/26/2018   Procedure: CARDIOVERSION;  Surgeon: Geralynn Rile, MD;  Location: [facility];  Service: Endoscopy;   Laterality: N/A;   CARDIOVERSION N/A 03/13/2019   Procedure: CARDIOVERSION;  Surgeon: Skeet Latch, MD;  Location: [facility];  Service: Cardiovascular;  Laterality: N/A;   CARDIOVERSION N/A 03/03/2021   Procedure: CARDIOVERSION;  Surgeon: Josue Hector, MD;  Location: [facility];  Service: Cardiovascular;  Laterality: N/A;   CATARACT EXTRACTION  01/19/2012   KNEE ARTHROSCOPY     left knee- 2002    TOTAL HIP ARTHROPLASTY  06/08/2011   Procedure: TOTAL HIP ARTHROPLASTY ANTERIOR APPROACH;  Surgeon: [name], MD;  Location: [facility];  Service: Orthopedics;  Laterality: Left;   TOTAL HIP ARTHROPLASTY Right 08/12/2020   ANTERIOR APPROACH   TOTAL HIP ARTHROPLASTY Right 08/12/2020   Procedure: TOTAL HIP ARTHROPLASTY ANTERIOR APPROACH;  Surgeon: Paralee Cancel, MD;  Location: [facility];  Service: Orthopedics;  Laterality: Right;    Current Outpatient Medications  Medication Sig Dispense Refill   amLODipine (NORVASC) 5 MG tablet TAKE 1 AND 1/2 TABLETS BY MOUTH EVERY DAY (Patient taking differently: 5 mg.) 135 tablet 2   amoxicillin (AMOXIL) 500 MG capsule SMARTSIG:4 Capsule(s) By Mouth     Coenzyme Q10 200 MG capsule Take 200 mg by mouth in the morning.     ELIQUIS 5 MG TABS tablet TAKE 1 TABLET BY MOUTH TWICE A DAY 180 tablet 1   ezetimibe (ZETIA) 10 MG tablet Take 1 tablet (10 mg total) by mouth in  the morning. 90 tablet 3   metFORMIN (GLUCOPHAGE) 500 MG tablet Take 1 tablet (500 mg total) by mouth daily with breakfast. 90 tablet 0   metoprolol succinate (TOPROL-XL) 50 MG 24 hr tablet Take 50 mg by mouth 2 (two) times daily. Take twice a day.     Multiple Vitamins-Minerals (MULTIVITAMIN WITH MINERALS) tablet Take 1 tablet by mouth daily.     olmesartan (BENICAR) 40 MG tablet TAKE 1 TABLET BY MOUTH EVERY DAY 90 tablet 3   Pitavastatin Calcium (LIVALO) 2 MG TABS Take 1 tablet (2 mg total) by mouth every evening. 90 tablet 3   spironolactone (ALDACTONE) 25 MG tablet TAKE 1/2 TABLET BY  MOUTH EVERY DAY 45 tablet 3   tamsulosin (FLOMAX) 0.4 MG CAPS capsule Take 0.4 mg by mouth in the morning.     triamcinolone cream (KENALOG) 0.5 % APPLY 1 APPLICATION TOPICALLY 2 (TWO) TIMES DAILY. TO AFFECTED AREAS. 30 g 3   No current facility-administered medications for this visit.    No Known Allergies  Social History   Socioeconomic History   Marital status: Married    Spouse name: Mose Colaizzi   Number of children: Not on file   Years of education: Not on file   Highest education level: Not on file  Occupational History   Occupation: Retired  Tobacco Use   Smoking status: Never   Smokeless tobacco: Never  Vaping Use   Vaping Use: Never used  Substance and Sexual Activity   Alcohol use: No   Drug use: No   Sexual activity: Yes    Birth control/protection: None  Other Topics Concern   Not on file  Social History Narrative   Married. [name].Takes care of his mother-in-law as well.    Retired, Conservator, museum/gallery.    Drinks caffeine beverages. Takes a daily vitamin.   Exercises routinely.    Smoke detector in the home.    Ambulates independently.    Social Determinants of Health   Financial Resource Strain: Low Risk  (04/10/2021)   Overall Financial Resource Strain (CARDIA)    Difficulty of Paying Living Expenses: Not hard at all  Food Insecurity: No Food Insecurity (04/10/2021)   Hunger Vital Sign    Worried About Running Out of Food in the Last Year: Never true    Ran Out of Food in the Last Year: Never true  Transportation Needs: No Transportation Needs (04/10/2021)   PRAPARE - Hydrologist (Medical): No    Lack of Transportation (Non-Medical): No  Physical Activity: Sufficiently Active (04/10/2021)   Exercise Vital Sign    Days of Exercise per Week: 7 days    Minutes of Exercise per Session: 50 min  Stress: No Stress Concern Present (04/10/2021)   [place]     Feeling of Stress : Not at all  Social Connections: Moderately Isolated (04/10/2021)   Social Connection and Isolation Panel [NHANES]    Frequency of Communication with Friends and Family: More than three times a week    Frequency of Social Gatherings with Friends and Family: Once a week    Attends Religious Services: Never    Marine scientist or Organizations: No    Attends Archivist Meetings: Never    Marital Status: Married  Human resources officer Violence: Not At Risk (04/10/2021)   Humiliation, Afraid, Rape, and Kick questionnaire    Fear of Current or Ex-Partner: No    Emotionally  Abused: No    Physically Abused: No    Sexually Abused: No     Review of Systems: All other systems reviewed and are otherwise negative except as noted above.  Physical Exam: Vitals:   07/08/21 1025  BP: 140/90  Pulse: 85  SpO2: 96%  Weight: 261 lb 9.6 oz (118.7 kg)  Height: '6\' 3"'$  (1.905 m)    GEN- The patient is well appearing, alert and oriented x 3 today.   HEENT: normocephalic, atraumatic; sclera clear, conjunctiva pink; hearing intact; oropharynx clear; neck supple, no JVP Lymph- no cervical lymphadenopathy Lungs- Clear to ausculation bilaterally, normal work of breathing.  No wheezes, rales, rhonchi Heart- Regular rate and rhythm, no murmurs, rubs or gallops, PMI not laterally displaced GI- soft, non-tender, non-distended, bowel sounds present, no hepatosplenomegaly Extremities- no clubbing, cyanosis, or edema; DP/PT/radial pulses 2+ bilaterally MS- no significant deformity or atrophy Skin- warm and dry, no rash or lesion Psych- euthymic mood, full affect Neuro- strength and sensation are intact  EKG is ordered. Personal review of EKG from today shows NSR at 85 bpm  Additional studies reviewed include: Previous EP office notes.   Assessment and Plan:  1. Persistent atrial fibrillation Continue metoprolol and Eliquis 5 mg BID CHA2DS2VASc  of at least 2.  He would be  amenable to a repeat ablation if AF recurs  2. HTN Stable on current regimen   3. OSA Encouraged nightly CPAP  4. Fatigue Likely multifactorial, low suspicion of cardiac involvement Offered monitor to detect silent AF/flutter and pt refused, prefers to defer for now Update echo given OSA and persistent AF.  Labs today.  Encouraged to exercise as tolerated, With several months of a relatively sedentary life, this may continue to improve as he becomes more active.   5. Obesity Body mass index is 32.7 kg/m.  His goal weight is ~220. Encouraged to continue exercise as above.   Follow up with Dr. Curt Bears in 6 months. Sooner pending test results.  Pt can let us know any time before then if he would like [name] to send out a Zio.   Shirley Friar, PA-C  07/08/21 10:29 AM

## 2021-07-09 ENCOUNTER — Telehealth: Payer: Self-pay | Admitting: Cardiology

## 2021-07-09 ENCOUNTER — Ambulatory Visit (INDEPENDENT_AMBULATORY_CARE_PROVIDER_SITE_OTHER): Payer: Medicare PPO

## 2021-07-09 DIAGNOSIS — I480 Paroxysmal atrial fibrillation: Secondary | ICD-10-CM

## 2021-07-09 NOTE — Telephone Encounter (Signed)
Placed order for monitor. Patient is aware that he will get a message from [place] of instructions.

## 2021-07-09 NOTE — Progress Notes (Unsigned)
Enrolled for [employer] to mail a ZIO XT long term holter monitor to the patients address on file.   Dr. Camnitz to read. 

## 2021-07-09 NOTE — Telephone Encounter (Signed)
Pt would like a call back to discuss the ZIO that was offered at his office appt 06/21. He states he has decided to go ahead with this and would like to get it started.

## 2021-07-09 NOTE — Telephone Encounter (Signed)
Called patient back about message. Patient stated he is ready to wear the monitor now. Will send message to [place] PA to see how long patient needs to wear monitor and see if he wants live or not.

## 2021-07-09 NOTE — Telephone Encounter (Signed)
Would have him wear a 2 week Zio XT

## 2021-07-12 ENCOUNTER — Encounter: Payer: Self-pay | Admitting: Cardiovascular Disease

## 2021-07-12 DIAGNOSIS — I480 Paroxysmal atrial fibrillation: Secondary | ICD-10-CM | POA: Diagnosis not present

## 2021-07-13 ENCOUNTER — Other Ambulatory Visit: Payer: Self-pay | Admitting: Cardiovascular Disease

## 2021-07-17 ENCOUNTER — Other Ambulatory Visit (INDEPENDENT_AMBULATORY_CARE_PROVIDER_SITE_OTHER): Payer: Self-pay | Admitting: Family Medicine

## 2021-07-17 DIAGNOSIS — R7303 Prediabetes: Secondary | ICD-10-CM

## 2021-07-22 ENCOUNTER — Ambulatory Visit (INDEPENDENT_AMBULATORY_CARE_PROVIDER_SITE_OTHER): Payer: Medicare PPO | Admitting: Family Medicine

## 2021-07-22 ENCOUNTER — Encounter (INDEPENDENT_AMBULATORY_CARE_PROVIDER_SITE_OTHER): Payer: Self-pay | Admitting: Family Medicine

## 2021-07-22 VITALS — BP 156/78 | HR 76 | Temp 99.1°F | Ht 75.0 in | Wt 254.8 lb

## 2021-07-22 DIAGNOSIS — I4891 Unspecified atrial fibrillation: Secondary | ICD-10-CM | POA: Diagnosis not present

## 2021-07-22 DIAGNOSIS — Z6831 Body mass index (BMI) 31.0-31.9, adult: Secondary | ICD-10-CM

## 2021-07-22 DIAGNOSIS — Z7984 Long term (current) use of oral hypoglycemic drugs: Secondary | ICD-10-CM

## 2021-07-22 DIAGNOSIS — I10 Essential (primary) hypertension: Secondary | ICD-10-CM

## 2021-07-22 DIAGNOSIS — N289 Disorder of kidney and ureter, unspecified: Secondary | ICD-10-CM | POA: Diagnosis not present

## 2021-07-22 DIAGNOSIS — M1712 Unilateral primary osteoarthritis, left knee: Secondary | ICD-10-CM | POA: Diagnosis not present

## 2021-07-22 DIAGNOSIS — E669 Obesity, unspecified: Secondary | ICD-10-CM

## 2021-07-22 DIAGNOSIS — R7303 Prediabetes: Secondary | ICD-10-CM | POA: Diagnosis not present

## 2021-07-22 DIAGNOSIS — E66812 Obesity, class 2: Secondary | ICD-10-CM

## 2021-07-22 MED ORDER — METFORMIN HCL 500 MG PO TABS: 500.0000 mg | ORAL_TABLET | Freq: Every day | ORAL | 0 refills | Status: DC

## 2021-07-23 NOTE — Progress Notes (Signed)
Chief Complaint:   OBESITY Clayton [name] is here to discuss his progress with his obesity treatment plan along with follow-up of his obesity related diagnoses. Clayton [name] is on keeping a food journal and adhering to recommended goals of 1500-1800 calories and 100+ grams of protein daily and states he is following his eating plan approximately 80% of the time. Clayton [name] states he is riding the stationary bike for 30 minutes 6 times per week.  Today's visit was #: 43 Starting weight: 266 lbs Starting date: 11/01/2017 Today's weight: 254 lbs Today's date: 07/22/2021 Total lbs lost to date: 12 Total lbs lost since last in-office visit: 0  Interim History: Clayton [name] is feeling increased fatigue during golf.  He has occasional heart palpitations.  He is seeing Dr. Claiborne Billings and wearing a ZIO heart monitor.  He has not been tracking his carbohydrate intake and he feels like he is over eating fruit.  He craves chocolate.  He is getting in some water daily but <64 oz.   Subjective:   1. Pre-diabetes Captain's A1c is not available for review.  He continues to work on a low sugar/low carbohydrate diet with dietary tracking.  He is on metformin 500 mg daily, and he denies diarrhea or abdominal pain.  2. Essential hypertension Clayton [name]'s home blood pressure log was reviewed today.  His blood pressures are averaging at<140/90.  He denies chest pain or dyspnea on exertion.  3. Atrial fibrillation, unspecified type (HCC) Clayton [name] is wearing a ZIO monitor.  He has a chief complaint of increased fatigue.  He is cautioned about position changes while golfing due to high risk of falling.  4. Impaired renal function Clayton [name]'s last creatinine was stable at 1.37 on 07/08/2021 with GFR  of 54.  He avoids use of NSAIDs.  He hydrates well with approximately > 64 ounces of water daily.  5. Osteoarthritis of left knee, unspecified osteoarthritis type Clayton [name] is seeing Dr. Gardenia Phlegm for injections and he notes it is  helping.  Assessment/Plan:   1. Pre-diabetes Clayton [name] will continue metformin 500 mg once daily with breakfast, and we will refill for 1 month.  - metFORMIN (GLUCOPHAGE) 500 MG tablet; Take 1 tablet (500 mg total) by mouth daily with breakfast.  Dispense: 30 tablet; Refill: 0  2. Essential hypertension Clayton [name] will continue Norvasc, Toprol, and Benicar per Dr. Claiborne Billings.  3. Atrial fibrillation, unspecified type Clayton [name] will keep his upcoming follow-up with cardiology.  4. Impaired renal function Clayton [name] has a follow-up with his PCP to discuss given family history of renal disease.  5. Osteoarthritis of left knee, unspecified osteoarthritis type Clayton [name] will continue to work on his weight loss.  He is able to ride his stationary bike without pain and will continue.  6. Obesity, Current BMI 31.8 Clayton [name] is currently in the action stage of change. As such, his goal is to continue with weight loss efforts. He has agreed to following a lower carbohydrate, vegetable and lean protein rich diet plan.   Return to carbohydrate tracking.  Smart fruit handout was given.  Follow-up with cardiology for fatigue/heart palpitations limiting exercise.  Exercise goals: As is.  Behavioral modification strategies: increasing water intake, keeping healthy foods in the home, and keeping a strict food journal.  Clayton [name] has agreed to follow-up with our clinic in 4 weeks. He was informed of the importance of frequent follow-up visits to maximize his success with intensive lifestyle modifications for his multiple health conditions.   Objective:   Blood pressure (!) 156/78, pulse  76, temperature 99.1 F (37.3 C), height '6\' 3"'$  (1.905 m), weight 254 lb 12.8 oz (115.6 kg), SpO2 96 %. Body mass index is 31.85 kg/m.  General: Cooperative, alert, well developed, in no acute distress. HEENT: Conjunctivae and lids unremarkable. Cardiovascular: Regular rhythm.  Lungs: Normal work of breathing. Neurologic: No focal  deficits.   Lab Results  Component Value Date   CREATININE 1.37 (H) 07/08/2021   BUN 30 (H) 07/08/2021   NA 138 07/08/2021   K 4.5 07/08/2021   CL 104 07/08/2021   CO2 21 07/08/2021   Lab Results  Component Value Date   ALT 21 07/08/2021   AST 24 07/08/2021   ALKPHOS 80 07/08/2021   BILITOT 0.8 07/08/2021   Lab Results  Component Value Date   HGBA1C 5.8 (H) 07/04/2020   HGBA1C 5.8 (H) 08/01/2019   HGBA1C 5.8 (H) 09/04/2018   HGBA1C 6.0 (H) 11/01/2017   HGBA1C 5.4 04/14/2016   Lab Results  Component Value Date   INSULIN 3.9 08/01/2019   INSULIN 9.0 09/04/2018   INSULIN 22.4 11/01/2017   Lab Results  Component Value Date   TSH 1.010 07/08/2021   Lab Results  Component Value Date   CHOL 120 08/01/2020   HDL 54 08/01/2020   LDLCALC 50 08/01/2020   TRIG 81 08/01/2020   CHOLHDL 2.2 08/01/2020   Lab Results  Component Value Date   VD25OH 39.95 01/24/2020   VD25OH 37.6 08/01/2019   VD25OH 43.2 09/04/2018   Lab Results  Component Value Date   WBC 7.3 07/08/2021   HGB 13.8 07/08/2021   HCT 42.0 07/08/2021   MCV 88 07/08/2021   PLT 198 07/08/2021   Lab Results  Component Value Date   IRON 71 01/24/2020   FERRITIN 77.1 01/24/2020   Attestation Statements:   Reviewed by clinician on day of visit: allergies, medications, problem list, medical history, surgical history, family history, social history, and previous encounter notes.  Time spent on visit including pre-visit chart review and post-visit care and charting was 45 minutes.   I, Trixie Dredge, am acting as transcriptionist for Dennard Nip, MD.  I have reviewed the above documentation for accuracy and completeness, and I agree with the above. -  Dennard Nip, MD

## 2021-07-24 ENCOUNTER — Ambulatory Visit (INDEPENDENT_AMBULATORY_CARE_PROVIDER_SITE_OTHER): Payer: Medicare PPO

## 2021-07-24 DIAGNOSIS — I4819 Other persistent atrial fibrillation: Secondary | ICD-10-CM | POA: Diagnosis not present

## 2021-07-24 LAB — ECHOCARDIOGRAM COMPLETE
AR max vel: 3.74 cm2
AV Area VTI: 3.81 cm2
AV Area mean vel: 3.51 cm2
AV Mean grad: 5 mmHg
AV Peak grad: 9 mmHg
Ao pk vel: 1.5 m/s
Area-P 1/2: 3.58 cm2
Calc EF: 54.2 %
S' Lateral: 3.91 cm
Single Plane A2C EF: 41.3 %
Single Plane A4C EF: 60.7 %

## 2021-07-27 ENCOUNTER — Encounter: Payer: Self-pay | Admitting: Family Medicine

## 2021-07-27 ENCOUNTER — Ambulatory Visit (INDEPENDENT_AMBULATORY_CARE_PROVIDER_SITE_OTHER): Payer: Medicare PPO | Admitting: Family Medicine

## 2021-07-27 VITALS — BP 125/71 | HR 78 | Temp 98.0°F | Ht 74.5 in | Wt 260.0 lb

## 2021-07-27 DIAGNOSIS — I10 Essential (primary) hypertension: Secondary | ICD-10-CM

## 2021-07-27 DIAGNOSIS — Z1211 Encounter for screening for malignant neoplasm of colon: Secondary | ICD-10-CM

## 2021-07-27 DIAGNOSIS — R7303 Prediabetes: Secondary | ICD-10-CM

## 2021-07-27 DIAGNOSIS — Z0000 Encounter for general adult medical examination without abnormal findings: Secondary | ICD-10-CM

## 2021-07-27 DIAGNOSIS — R7309 Other abnormal glucose: Secondary | ICD-10-CM

## 2021-07-27 DIAGNOSIS — Z1159 Encounter for screening for other viral diseases: Secondary | ICD-10-CM | POA: Diagnosis not present

## 2021-07-27 DIAGNOSIS — Z6833 Body mass index (BMI) 33.0-33.9, adult: Secondary | ICD-10-CM

## 2021-07-27 DIAGNOSIS — Z2821 Immunization not carried out because of patient refusal: Secondary | ICD-10-CM | POA: Insufficient documentation

## 2021-07-27 DIAGNOSIS — N289 Disorder of kidney and ureter, unspecified: Secondary | ICD-10-CM

## 2021-07-27 DIAGNOSIS — E782 Mixed hyperlipidemia: Secondary | ICD-10-CM | POA: Diagnosis not present

## 2021-07-27 DIAGNOSIS — Z125 Encounter for screening for malignant neoplasm of prostate: Secondary | ICD-10-CM

## 2021-07-27 DIAGNOSIS — I4819 Other persistent atrial fibrillation: Secondary | ICD-10-CM

## 2021-07-27 DIAGNOSIS — D6869 Other thrombophilia: Secondary | ICD-10-CM

## 2021-07-27 NOTE — Progress Notes (Unsigned)
Patient ID: Clayton [name], male  DOB: September 27, 1945, 76 y.o.   MRN: [id number] Patient Care Team    Relationship Specialty Notifications Start End  Ma Hillock, DO PCP - General Family Medicine  06/09/15   [name], MD PCP - Cardiology Cardiology  10/10/18   Constance Haw, MD PCP - Electrophysiology Cardiology Admissions 02/08/19   [name], MD Consulting Physician Cardiology  06/09/15   Warden Fillers, MD Consulting Physician Ophthalmology  06/09/15   Sharyne Peach, MD Consulting Physician Ophthalmology  06/09/15   Sydnee Levans, MD Consulting Physician Dermatology  06/09/15   Vicie Mutters, MD Consulting Physician Otolaryngology  06/09/15   Paralee Cancel, MD Consulting Physician Orthopedic Surgery  06/09/15   Ardis Hughs, MD Attending Physician Urology  07/27/21     Chief Complaint  Patient presents with   Annual Exam    Pt is not fastinf    Subjective: Clayton [name] is a 76 y.o. male present for CPE. All past medical history, surgical history, allergies, family history, immunizations, medications and social history were updated in the electronic medical record today. All recent labs, ED visits and hospitalizations within the last year were reviewed.  Health maintenance:  Colonoscopy: pt reports he had a colonoscopy many yrs ago. Has declined since but   cologuard desired.  Immunizations:  declines vaccines.  Infectious disease screening: Hep C completed today agreeable to testing. PSA:pt was counseled on prostate cancer screenings. He follows with urology twice a year. Assistive device: none Oxygen CLE:XNTZ Patient has a Dental home. Hospitalizations/ED visits: reviewed     07/27/2021    9:04 AM 07/04/2020    1:32 PM 11/01/2017    8:40 AM 06/09/2015   10:02 AM  Depression screen PHQ 2/9  Decreased Interest 0 0 1 0  Down, Depressed, Hopeless 0 0 0 0  PHQ - 2 Score 0 0 1 0  Altered sleeping   3   Tired, decreased energy   1    Change in appetite   0   Feeling bad or failure about yourself    0   Trouble concentrating   0   Moving slowly or fidgety/restless   0   Suicidal thoughts   0   PHQ-9 Score   5   Difficult doing work/chores   Not difficult at all        No data to display                  04/10/2021    4:26 PM 07/04/2020    1:32 PM 02/27/2019   10:33 AM 06/09/2015   10:02 AM  Fall Risk   Falls in the past year? 0 0 0 No  Number falls in past yr: 0 0    Injury with Fall? 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 Humphrey Rolls; CPAP titration 11/12/04- Dr. Humphrey Rolls   No Known Allergies Past Surgical History:  Procedure Laterality Date   ATRIAL FIBRILLATION ABLATION N/A 09/10/2020   Procedure: ATRIAL FIBRILLATION ABLATION;  Surgeon: Constance Haw, MD;  Location: [facility];  Service: Cardiovascular;  Laterality: N/A;   CARDIAC CATHETERIZATION  11/03/2005   CARDIOVERSION N/A 09/26/2018   Procedure: CARDIOVERSION;  Surgeon: Geralynn Rile, MD;  Location: [place];  Service: Endoscopy;  Laterality: N/A;   CARDIOVERSION N/A 03/13/2019   Procedure: CARDIOVERSION;  Surgeon: Skeet Latch, MD;  Location: [place];  Service: Cardiovascular;  Laterality: N/A;    CARDIOVERSION N/A 03/03/2021   Procedure: CARDIOVERSION;  Surgeon: Josue Hector, MD;  Location: [facility];  Service: Cardiovascular;  Laterality: N/A;   CATARACT EXTRACTION  01/19/2012   KNEE ARTHROSCOPY     left knee- 2002    TOTAL HIP ARTHROPLASTY  06/08/2011   Procedure: TOTAL HIP ARTHROPLASTY ANTERIOR APPROACH;  Surgeon: [name], MD;  Location: [facility];  Service: Orthopedics;  Laterality: Left;   TOTAL HIP ARTHROPLASTY Right 08/12/2020   ANTERIOR APPROACH   TOTAL HIP ARTHROPLASTY Right 08/12/2020   Procedure: TOTAL HIP ARTHROPLASTY ANTERIOR APPROACH;  Surgeon: Paralee Cancel, MD;  Location: [facility];  Service: Orthopedics;  Laterality: Right;   Family History  Problem Relation Age of Onset   Hypertension Mother    Stroke Mother    Diabetes Mother    Hyperlipidemia Mother    Obesity Mother    Diabetes Sister    Stroke Maternal Grandmother        27 died    Heart disease Maternal Grandfather    Hypertension Maternal Grandfather    Diabetes Maternal Grandfather    Arthritis Father    Heart disease Father    Heart disease Paternal Grandfather    Social History   Social History Narrative   Married. [name].Takes care of his mother-in-law as well.    Retired, Conservator, museum/gallery.    Drinks caffeine beverages. Takes a daily vitamin.   Exercises routinely.    Smoke detector in the home.    Ambulates independently.     Allergies as of 07/27/2021   No Known Allergies      Medication List        Accurate as of July 27, 2021 11:59 PM. If you have any questions, ask your nurse or doctor.          amLODipine 5 MG tablet Commonly known as: NORVASC TAKE 1 AND 1/2 TABLETS BY MOUTH EVERY DAY What changed:  how much to take how to take this when to take this   amoxicillin 500 MG capsule Commonly known as: AMOXIL SMARTSIG:4 Capsule(s) By Mouth   Coenzyme Q10 200 MG capsule Take 200 mg by mouth in the morning.   Eliquis 5 MG Tabs tablet Generic drug:  apixaban TAKE 1 TABLET BY MOUTH TWICE A DAY   ezetimibe 10 MG tablet Commonly known as: ZETIA Take 1 tablet (10 mg total) by mouth in the morning.   Livalo 2 MG Tabs Generic drug: Pitavastatin Calcium Take 1 tablet (2 mg total) by mouth every evening.   metFORMIN 500 MG tablet Commonly known as: GLUCOPHAGE Take 1 tablet (500 mg total) by mouth daily with  breakfast.   metoprolol succinate 100 MG 24 hr tablet Commonly known as: TOPROL-XL Take 1 tablet (100 mg total) by mouth daily. What changed: Another medication with the same name was removed. Continue taking this medication, and follow the directions you see here. Changed by: Howard Pouch, DO   multivitamin with minerals tablet Take 1 tablet by mouth daily.   olmesartan 40 MG tablet Commonly known as: BENICAR TAKE 1 TABLET BY MOUTH EVERY DAY   spironolactone 25 MG tablet Commonly known as: ALDACTONE Take 0.5 tablets (12.5 mg total) by mouth daily.   tamsulosin 0.4 MG Caps capsule Commonly known as: FLOMAX Take 0.4 mg by mouth in the morning.   triamcinolone cream 0.5 % Commonly known as: KENALOG APPLY 1 APPLICATION TOPICALLY 2 (TWO) TIMES DAILY. TO AFFECTED AREAS.       All past medical history, surgical history, allergies, family history, immunizations andmedications were updated in the EMR today and reviewed under the history and medication portions of their EMR.     Recent Results (from the past 2160 hour(s))  Comprehensive metabolic panel     Status: Abnormal   Collection Time: 07/08/21 11:00 AM  Result Value Ref Range   Glucose 104 (H) 70 - 99 mg/dL   BUN 30 (H) 8 - 27 mg/dL   Creatinine, Ser 1.37 (H) 0.76 - 1.27 mg/dL   eGFR 54 (L) >59 mL/min/1.73   BUN/Creatinine Ratio 22 10 - 24   Sodium 138 134 - 144 mmol/L   Potassium 4.5 3.5 - 5.2 mmol/L   Chloride 104 96 - 106 mmol/L   CO2 21 20 - 29 mmol/L   Calcium 10.4 (H) 8.6 - 10.2 mg/dL   Total Protein 7.0 6.0 - 8.5 g/dL   Albumin 4.6 3.7 - 4.7 g/dL    Globulin, Total 2.4 1.5 - 4.5 g/dL   Albumin/Globulin Ratio 1.9 1.2 - 2.2   Bilirubin Total 0.8 0.0 - 1.2 mg/dL   Alkaline Phosphatase 80 44 - 121 IU/L   AST 24 0 - 40 IU/L   ALT 21 0 - 44 IU/L  CBC     Status: None   Collection Time: 07/08/21 11:00 AM  Result Value Ref Range   WBC 7.3 3.4 - 10.8 x10E3/uL   RBC 4.75 4.14 - 5.80 x10E6/uL   Hemoglobin 13.8 13.0 - 17.7 g/dL   Hematocrit 42.0 37.5 - 51.0 %   MCV 88 79 - 97 fL   MCH 29.1 26.6 - 33.0 pg   MCHC 32.9 31.5 - 35.7 g/dL   RDW 12.7 11.6 - 15.4 %   Platelets 198 150 - 450 x10E3/uL  TSH     Status: None   Collection Time: 07/08/21 11:00 AM  Result Value Ref Range   TSH 1.010 0.450 - 4.500 uIU/mL  ECHOCARDIOGRAM COMPLETE     Status: None   Collection Time: 07/24/21 11:15 AM  Result Value Ref Range   S' Lateral 3.91 cm   AV Area VTI 3.81 cm2   AV Mean grad 5.0 mmHg   Single Plane A4C EF 60.7 %   Single Plane A2C EF 41.3 %   Calc EF 54.2 %   AV Area mean vel 3.51 cm2   Area-P 1/2 3.58 cm2   AR max vel 3.74 cm2   AV Peak grad 9.0 mmHg   Ao pk vel 1.50 m/s    No results found.  ROS 14 pt review of systems performed and negative (unless mentioned in an HPI)  Objective: BP 125/71  Pulse 78   Temp 98 F (36.7 C) (Oral)   Ht 6' 2.5" (1.892 m)   Wt 260 lb (117.9 kg)   SpO2 98%   BMI 32.94 kg/m  Physical Exam Constitutional:      General: He is not in acute distress.    Appearance: Normal appearance. He is obese. He is not ill-appearing, toxic-appearing or diaphoretic.  HENT:     Head: Normocephalic and atraumatic.     Right Ear: Tympanic membrane, ear canal and external ear normal. There is no impacted cerumen.     Left Ear: Tympanic membrane, ear canal and external ear normal. There is no impacted cerumen.     Nose: Nose normal. No congestion or rhinorrhea.     Mouth/Throat:     Mouth: Mucous membranes are moist.     Pharynx: Oropharynx is clear. No oropharyngeal exudate or posterior oropharyngeal erythema.   Eyes:     General: No scleral icterus.       Right eye: No discharge.        Left eye: No discharge.     Extraocular Movements: Extraocular movements intact.     Pupils: Pupils are equal, round, and reactive to light.  Cardiovascular:     Rate and Rhythm: Normal rate and regular rhythm.     Pulses: Normal pulses.     Heart sounds: Normal heart sounds. No murmur heard.    No friction rub. No gallop.  Pulmonary:     Effort: Pulmonary effort is normal. No respiratory distress.     Breath sounds: Normal breath sounds. No stridor. No wheezing, rhonchi or rales.  Chest:     Chest wall: No tenderness.  Abdominal:     General: Abdomen is flat. Bowel sounds are normal. There is no distension.     Palpations: Abdomen is soft. There is no mass.     Tenderness: There is no abdominal tenderness. There is no right CVA tenderness, left CVA tenderness, guarding or rebound.     Hernia: No hernia is present.  Musculoskeletal:        General: No swelling or tenderness. Normal range of motion.     Cervical back: Normal range of motion and neck supple.     Right lower leg: No edema.     Left lower leg: No edema.  Lymphadenopathy:     Cervical: No cervical adenopathy.  Skin:    General: Skin is warm and dry.     Coloration: Skin is not jaundiced.     Findings: No bruising, lesion or rash.  Neurological:     General: No focal deficit present.     Mental Status: He is alert and oriented to person, place, and time. Mental status is at baseline.     Cranial Nerves: No cranial nerve deficit.     Sensory: No sensory deficit.     Motor: No weakness.     Coordination: Coordination normal.     Gait: Gait normal.     Deep Tendon Reflexes: Reflexes normal.  Psychiatric:        Mood and Affect: Mood normal.        Behavior: Behavior normal.        Thought Content: Thought content normal.        Judgment: Judgment normal.     No results found.  Assessment/plan: Clayton [name] is a 76 y.o. male  present for CPE Encounter for hepatitis C screening test for low risk patient - Hepatitis C Antibody Prostate  cancer screening Follows w/ urology twice a year.  Hypercalcemia - PTH, Intact and Calcium - Vitamin D (25 hydroxy) Elevated hemoglobin A1c - Hemoglobin A1c Colon cancer screening - Cologuard Vaccination declined- ALL Persistent atrial fibrillation (HCC)/Essential hypertension/Secondary hypercoagulable state (HCC)/HLD Follows with Dr. Claiborne Billings - Comprehensive metabolic panel - Lipid panel Impaired renal function Mild impairment  Routine general medical examination at a health care facility Colonoscopy: pt reports he had a colonoscopy many yrs ago. Has declined since but   cologuard desired.  Immunizations:  declines vaccines.  Infectious disease screening: Hep C completed today agreeable to testing. PSA:pt was counseled on prostate cancer screenings. He follows with urology twice a year. Patient was encouraged to exercise greater than 150 minutes a week. Patient was encouraged to choose a diet filled with fresh fruits and vegetables, and lean meats. AVS provided to patient today for education/recommendation on gender specific health and safety maintenance.  Return in about 1 year (around 07/29/2022) for cpe (40 min).   Orders Placed This Encounter  Procedures   Cologuard   Comprehensive metabolic panel   Hemoglobin A1c   Hepatitis C Antibody   Lipid panel   PTH, Intact and Calcium   Vitamin D (25 hydroxy)   No orders of the defined types were placed in this encounter.  Referral Orders  No referral(s) requested today     Note is dictated utilizing voice recognition software. Although note has been proof read prior to signing, occasional typographical errors still can be missed. If any questions arise, please do not hesitate to call for verification.  Electronically signed by: Howard Pouch, DO [place]

## 2021-07-27 NOTE — Patient Instructions (Signed)
No follow-ups on file.        Great to see you today.  I have refilled the medication(s) we provide.   If labs were collected, we will inform you of lab results once received either by echart message or telephone call.   - echart message- for normal results that have been seen by the patient already.   - telephone call: abnormal results or if patient has not viewed results in their echart.  Health Maintenance After Age 75 After age 83, you are at a higher risk for certain long-term diseases and infections as well as injuries from falls. Falls are a major cause of broken bones and head injuries in people who are older than age 60. Getting regular preventive care can help to keep you healthy and well. Preventive care includes getting regular testing and making lifestyle changes as recommended by your health care provider. Talk with your health care provider about: Which screenings and tests you should have. A screening is a test that checks for a disease when you have no symptoms. A diet and exercise plan that is right for you. What should I know about screenings and tests to prevent falls? Screening and testing are the best ways to find a health problem early. Early diagnosis and treatment give you the best chance of managing medical conditions that are common after age 83. Certain conditions and lifestyle choices may make you more likely to have a fall. Your health care provider may recommend: Regular vision checks. Poor vision and conditions such as cataracts can make you more likely to have a fall. If you wear glasses, make sure to get your prescription updated if your vision changes. Medicine review. Work with your health care provider to regularly review all of the medicines you are taking, including over-the-counter medicines. Ask your health care provider about any side effects that may make you more likely to have a fall. Tell your health care provider if any medicines that you take make  you feel dizzy or sleepy. Strength and balance checks. Your health care provider may recommend certain tests to check your strength and balance while standing, walking, or changing positions. Foot health exam. Foot pain and numbness, as well as not wearing proper footwear, can make you more likely to have a fall. Screenings, including: Osteoporosis screening. Osteoporosis is a condition that causes the bones to get weaker and break more easily. Blood pressure screening. Blood pressure changes and medicines to control blood pressure can make you feel dizzy. Depression screening. You may be more likely to have a fall if you have a fear of falling, feel depressed, or feel unable to do activities that you used to do. Alcohol use screening. Using too much alcohol can affect your balance and may make you more likely to have a fall. Follow these instructions at home: Lifestyle Do not drink alcohol if: Your health care provider tells you not to drink. If you drink alcohol: Limit how much you have to: 0-1 drink a day for women. 0-2 drinks a day for men. Know how much alcohol is in your drink. In the [place] one drink equals one 12 oz bottle of beer (355 mL), one 5 oz glass of wine (148 mL), or one 1 oz glass of hard liquor (44 mL). Do not use any products that contain nicotine or tobacco. These products include cigarettes, chewing tobacco, and vaping devices, such as e-cigarettes. If you need help quitting, ask your health care provider. Activity  Follow  a regular exercise program to stay fit. This will help you maintain your balance. Ask your health care provider what types of exercise are appropriate for you. If you need a cane or walker, use it as recommended by your health care provider. Wear supportive shoes that have nonskid soles. Safety  Remove any tripping hazards, such as rugs, cords, and clutter. Install safety equipment such as grab bars in bathrooms and safety rails on stairs. Keep  rooms and walkways well-lit. General instructions Talk with your health care provider about your risks for falling. Tell your health care provider if: You fall. Be sure to tell your health care provider about all falls, even ones that seem minor. You feel dizzy, tiredness (fatigue), or off-balance. Take over-the-counter and prescription medicines only as told by your health care provider. These include supplements. Eat a healthy diet and maintain a healthy weight. A healthy diet includes low-fat dairy products, low-fat (lean) meats, and fiber from whole grains, beans, and lots of fruits and vegetables. Stay current with your vaccines. Schedule regular health, dental, and eye exams. Summary Having a healthy lifestyle and getting preventive care can help to protect your health and wellness after age 25. Screening and testing are the best way to find a health problem early and help you avoid having a fall. Early diagnosis and treatment give you the best chance for managing medical conditions that are more common for people who are older than age 31. Falls are a major cause of broken bones and head injuries in people who are older than age 74. Take precautions to prevent a fall at home. Work with your health care provider to learn what changes you can make to improve your health and wellness and to prevent falls. This information is not intended to replace advice given to you by your health care provider. Make sure you discuss any questions you have with your health care provider. Document Revised: 05/26/2020 Document Reviewed: 05/26/2020 Elsevier Patient Education  [place].

## 2021-08-05 ENCOUNTER — Ambulatory Visit (INDEPENDENT_AMBULATORY_CARE_PROVIDER_SITE_OTHER): Payer: Medicare PPO

## 2021-08-05 DIAGNOSIS — E782 Mixed hyperlipidemia: Secondary | ICD-10-CM

## 2021-08-05 DIAGNOSIS — R7309 Other abnormal glucose: Secondary | ICD-10-CM

## 2021-08-05 DIAGNOSIS — Z1159 Encounter for screening for other viral diseases: Secondary | ICD-10-CM | POA: Diagnosis not present

## 2021-08-05 LAB — LIPID PANEL
Cholesterol: 113 mg/dL (ref 0–200)
HDL: 48.4 mg/dL (ref >39.00)
LDL Cholesterol: 55 mg/dL (ref 0–99)
NonHDL: 64.72
Total CHOL/HDL Ratio: 2
Triglycerides: 50 mg/dL (ref 0.0–149.0)
VLDL: 10 mg/dL (ref 0.0–40.0)

## 2021-08-05 LAB — COMPREHENSIVE METABOLIC PANEL
ALT: 20 U/L (ref 0–53)
AST: 22 U/L (ref 0–37)
Albumin: 4.3 g/dL (ref 3.5–5.2)
Alkaline Phosphatase: 55 U/L (ref 39–117)
BUN: 29 mg/dL — ABNORMAL HIGH (ref 6–23)
CO2: 24 mEq/L (ref 19–32)
Calcium: 9.5 mg/dL (ref 8.4–10.5)
Chloride: 108 mEq/L (ref 96–112)
Creatinine, Ser: 1.32 mg/dL (ref 0.40–1.50)
GFR: 52.96 mL/min — ABNORMAL LOW (ref >60.00)
Glucose, Bld: 104 mg/dL — ABNORMAL HIGH (ref 70–99)
Potassium: 4.7 mEq/L (ref 3.5–5.1)
Sodium: 139 mEq/L (ref 135–145)
Total Bilirubin: 0.9 mg/dL (ref 0.2–1.2)
Total Protein: 6.3 g/dL (ref 6.0–8.3)

## 2021-08-05 LAB — HEMOGLOBIN A1C: Hgb A1c MFr Bld: 6 % (ref 4.6–6.5)

## 2021-08-05 LAB — VITAMIN D 25 HYDROXY (VIT D DEFICIENCY, FRACTURES): VITD: 33.8 ng/mL (ref 30.00–100.00)

## 2021-08-06 LAB — PTH, INTACT AND CALCIUM
Calcium: 9.7 mg/dL (ref 8.6–10.3)
PTH: 67 pg/mL (ref 16–77)

## 2021-08-06 LAB — HEPATITIS C ANTIBODY: Hepatitis C Ab: NONREACTIVE

## 2021-08-07 ENCOUNTER — Other Ambulatory Visit: Payer: Self-pay | Admitting: Family Medicine

## 2021-08-07 DIAGNOSIS — R7303 Prediabetes: Secondary | ICD-10-CM

## 2021-08-07 DIAGNOSIS — M1712 Unilateral primary osteoarthritis, left knee: Secondary | ICD-10-CM | POA: Diagnosis not present

## 2021-08-07 DIAGNOSIS — Z96641 Presence of right artificial hip joint: Secondary | ICD-10-CM | POA: Diagnosis not present

## 2021-08-07 DIAGNOSIS — Z96643 Presence of artificial hip joint, bilateral: Secondary | ICD-10-CM | POA: Diagnosis not present

## 2021-08-07 MED ORDER — METFORMIN HCL 500 MG PO TABS: 500.0000 mg | ORAL_TABLET | Freq: Every day | ORAL | 1 refills | Status: DC

## 2021-08-11 ENCOUNTER — Ambulatory Visit (INDEPENDENT_AMBULATORY_CARE_PROVIDER_SITE_OTHER): Payer: Medicare PPO | Admitting: Psychologist

## 2021-08-11 DIAGNOSIS — F411 Generalized anxiety disorder: Secondary | ICD-10-CM

## 2021-08-11 NOTE — Progress Notes (Signed)
[facility] Counselor/Therapist Progress Note  Patient ID: MONTAGUE CORELLA, MRN: [id number],    Date: 08/11/2021  Time Spent: 09:02 am to 09:40 am; total time: 38 minutes  This session was held via in person. The patient consented to in-person therapy and was in the clinician's office. Limits of confidentiality were discussed with the patient.   Treatment Type: Individual Therapy  Reported Symptoms: Patient wants to understand the core of experiencing anxiety  Mental Status Exam: Appearance:  Well Groomed     Behavior: Appropriate  Motor: Normal  Speech/Language:  Normal Rate  Affect: Appropriate  Mood: normal  Thought process: normal  Thought content:   WNL  Sensory/Perceptual disturbances:   WNL  Orientation: oriented to person  Attention: Good  Concentration: Good  Memory: WNL  Fund of knowledge:  Good  Insight:   Fair  Judgment:  Good  Impulse Control: Good   Risk Assessment: Danger to Self:  No Self-injurious Behavior: No Danger to Others: No Duty to Warn:no Physical Aggression / Violence:No  Access to Firearms a concern: No  Gang Involvement:No   Subjective:  The patient described himself as doing well while reflecting on events since the last session. Continuing to talk, he stated that he wants to understand the "core" of the anxiety that he experiences. He processed thoughts and emotions. He asked to follow up and was agreeable to the homework assignment. He denied suicidal and homicidal ideation.   Interventions: Worked on developing a therapeutic relationship with the patient using active listening and reflective statements. Provided emotional support using empathy and validation. Used summary statements. Reviewed events since the last session. Processed thoughts and emotions. Validated patient's experience. Identified goals for the session. Used socratic questions to assist the patient gain insight into self. Provided psychoeducation about how  emotions have a language behind them. Used self-disclosure to assist the patient. Used movie Inside Out as an example to assist the patient understand the purpose behind emotions. Normalized and validated expressed thoughts and emotions. Provided psychoeducation about podcast episode. Provided empathic statements.  Assigned homework. Assessed for suicidal and homicidal ideation  Homework: Patient will listen to episode of psychologists off the clock regarding emotions.   Next Session: Emotional support and review homework  Diagnosis: F41.1 generalized anxiety disorder.   Plan:   Client Abilities: Friendly and easy to develop rapport  Client Preferences: ACT and CBT  Client statement of Needs: Coping skills  Treatment Level: Outpatient   Goals Reduce overall frequency, intensity, and duration of anxiety Stabilize anxiety level wile increasing ability to function Enhance ability to effectively cope with full variety of stressors Learn and implement coping skills that result in a reduction of anxiety   Objectives target date for all objectives is 09/24/2021 Verbalize an understanding of the cognitive, physiological, and behavioral components of anxiety Learning and implement calming skills to reduce overall anxiety Verbalize an understanding of the role that cognitive biases play in excessive irrational worry and persistent anxiety symptoms Identify, challenge, and replace based fearful talk Learn and implement problem solving strategies Identify and engage in pleasant activities Learning and implement personal and interpersonal skills to reduce anxiety and improve interpersonal relationships Learn to accept limitations in life and commit to tolerating, rather than avoiding, unpleasant emotions while accomplishing meaningful goals Identify major life conflicts from the past and present that form the basis for present anxiety Maintain involvement in work, family, and social  activities Reestablish a consistent sleep-wake cycle Cooperate with a medical evaluation  Interventions Engage  the patient in behavioral activation Use instruction, modeling, and role-playing to build the client's general social, communication, and/or conflict resolution skills Use Acceptance and Commitment Therapy to help client accept uncomfortable realities in order to accomplish value-consistent goals Reinforce the client's insight into the role of his/her past emotional pain and present anxiety  Support the client in following through with work, family, and social activities Teach and implement sleep hygiene practices  Refer the patient to a physician for a psychotropic medication consultation Monito the clint's psychotropic medication compliance Discuss how anxiety typically involves excessive worry, various bodily expressions of tension, and avoidance of what is threatening that interact to maintain the problem  Teach the patient relaxation skills Assign the patient homework Discuss examples demonstrating that unrealistic worry overestimates the probability of threats and underestimates patient's ability  Assist the patient in analyzing his or her worries Help patient understand that avoidance is reinforcing   The patient reviewed the treatment plan on 10/08/2020. The patient approved of the treatment plan.    Conception Chancy, PsyD

## 2021-08-15 DIAGNOSIS — I4891 Unspecified atrial fibrillation: Secondary | ICD-10-CM | POA: Diagnosis not present

## 2021-08-15 DIAGNOSIS — N400 Enlarged prostate without lower urinary tract symptoms: Secondary | ICD-10-CM | POA: Diagnosis not present

## 2021-08-15 DIAGNOSIS — E1122 Type 2 diabetes mellitus with diabetic chronic kidney disease: Secondary | ICD-10-CM | POA: Diagnosis not present

## 2021-08-15 DIAGNOSIS — D6869 Other thrombophilia: Secondary | ICD-10-CM | POA: Diagnosis not present

## 2021-08-15 DIAGNOSIS — N1831 Chronic kidney disease, stage 3a: Secondary | ICD-10-CM | POA: Diagnosis not present

## 2021-08-15 DIAGNOSIS — Z7901 Long term (current) use of anticoagulants: Secondary | ICD-10-CM | POA: Diagnosis not present

## 2021-08-15 DIAGNOSIS — I129 Hypertensive chronic kidney disease with stage 1 through stage 4 chronic kidney disease, or unspecified chronic kidney disease: Secondary | ICD-10-CM | POA: Diagnosis not present

## 2021-08-15 DIAGNOSIS — M1990 Unspecified osteoarthritis, unspecified site: Secondary | ICD-10-CM | POA: Diagnosis not present

## 2021-08-15 DIAGNOSIS — E785 Hyperlipidemia, unspecified: Secondary | ICD-10-CM | POA: Diagnosis not present

## 2021-08-17 DIAGNOSIS — Z1211 Encounter for screening for malignant neoplasm of colon: Secondary | ICD-10-CM | POA: Diagnosis not present

## 2021-08-19 ENCOUNTER — Ambulatory Visit (INDEPENDENT_AMBULATORY_CARE_PROVIDER_SITE_OTHER): Payer: Medicare PPO | Admitting: Family Medicine

## 2021-08-19 NOTE — Progress Notes (Signed)
Clayton [name] Phone: [phone] Subjective:   Clayton [name], am serving as a scribe for Dr. Hulan Saas.  I'm seeing this patient by the request  of:  Kuneff, Renee A, DO  CC: Hip pain, back pain, follow-up knee pain  FUX:[id number]  Clayton [name] is a 76 y.o. male coming in with complaint of back and neck pain. OMT on 06/25/2021. Also seen for left knee pain. Is approved for gel. Patient given steroid injection and tolerated the procedure well.  We will have patient set up again in 6 to 8 weeks.  May consider the possibility of viscosupplementation.  Has responded to this in the past and we will see how patient does.  Increase activity slowly otherwise.  Follow-up again in 8 to 12 weeks. Patient states that he is doing great. Using brace for activity.   Medications patient has been prescribed: None  Taking:         Reviewed prior external information including notes and imaging from previsou exam, outside providers and external EMR if available.   As well as notes that were available from care everywhere and other healthcare systems.  Past medical history, social, surgical and family history all reviewed in electronic medical record.  [name] pertanent information unless stated regarding to the chief complaint.   Past Medical History:  Diagnosis Date   Arthritis    hips    Atrial fibrillation (HCC)    Cataract    Cochlear Meniere syndrome of left ear 06/09/2015   CPAP (continuous positive airway pressure) dependence    Dysrhythmia    PAF- hx of 7 years ago    HLD (hyperlipidemia)    Hypertension 05/29/2010   ECHO-EF 67% wnl   Meniere disease    Obesity    Palpitations    S/P right total hip arthroplasty 08/12/2020   Sleep apnea 09/14/2004   [facility]- Dr. Humphrey Rolls; CPAP titration 11/12/04- Dr. Humphrey Rolls    [name] Known Allergies   Review of Systems:  [name] headache, visual changes, nausea,  vomiting, diarrhea, constipation, dizziness, abdominal pain, skin rash, fevers, chills, night sweats, weight loss, swollen lymph nodes, body aches, joint swelling, chest pain, shortness of breath, mood changes. POSITIVE muscle aches  Objective  Blood pressure 134/88, pulse 81, height 6' 2.5" (1.892 m), SpO2 97 %.   General: [name] apparent distress alert and oriented x3 mood and affect normal, dressed appropriately.  HEENT: Pupils equal, extraocular movements intact  Respiratory: Patient's speak in full sentences and does not appear short of breath  Cardiovascular: [name] lower extremity edema, non tender, [name] erythema  Gait overall normal with slight protection of the left knee MSK:  Back more tightness noted on the right side of the paraspinal musculature.  More tightness in the sacroiliac joint.  Neck exam does have some limited sidebending bilaterally.  Osteopathic findings  C2 flexed rotated and side bent right C5 flexed rotated and side bent left T5 extended rotated and side bent right inhaled rib L2 flexed rotated and side bent right Sacrum right on right       Assessment and Plan:  Degenerative disc disease, lumbar Degenerative disc disease.  Patient is still trying to increase activity after his hip replacement.  Patient is having some mild tightness.  More seem to be more in the cervical area today.  Discussed posture and ergonomics otherwise.  Increase activity slowly.  Follow-up again in 6 to 8 weeks.  Nonallopathic problems  Decision today to treat with OMT was based on Physical Exam  After verbal consent patient was treated with HVLA, ME, FPR techniques in cervical, rib, thoracic, lumbar, and sacral  areas  Patient tolerated the procedure well with improvement in symptoms  Patient given exercises, stretches and lifestyle modifications  See medications in patient instructions if given  Patient will follow up in 4-8 weeks    The above documentation has been reviewed  and is accurate and complete Clayton Pulley, DO          Note: This dictation was prepared with Dragon dictation along with smaller phrase technology. Any transcriptional errors that result from this process are unintentional.

## 2021-08-20 ENCOUNTER — Ambulatory Visit: Payer: Medicare PPO | Admitting: Family Medicine

## 2021-08-20 VITALS — BP 134/88 | HR 81 | Ht 74.5 in

## 2021-08-20 DIAGNOSIS — M9901 Segmental and somatic dysfunction of cervical region: Secondary | ICD-10-CM

## 2021-08-20 DIAGNOSIS — M5136 Other intervertebral disc degeneration, lumbar region: Secondary | ICD-10-CM | POA: Diagnosis not present

## 2021-08-20 DIAGNOSIS — M9908 Segmental and somatic dysfunction of rib cage: Secondary | ICD-10-CM | POA: Diagnosis not present

## 2021-08-20 DIAGNOSIS — M9904 Segmental and somatic dysfunction of sacral region: Secondary | ICD-10-CM

## 2021-08-20 DIAGNOSIS — M9903 Segmental and somatic dysfunction of lumbar region: Secondary | ICD-10-CM | POA: Diagnosis not present

## 2021-08-20 DIAGNOSIS — M9902 Segmental and somatic dysfunction of thoracic region: Secondary | ICD-10-CM | POA: Diagnosis not present

## 2021-08-20 NOTE — Assessment & Plan Note (Signed)
Degenerative disc disease.  Patient is still trying to increase activity after his hip replacement.  Patient is having some mild tightness.  More seem to be more in the cervical area today.  Discussed posture and ergonomics otherwise.  Increase activity slowly.  Follow-up again in 6 to 8 weeks.

## 2021-08-20 NOTE — Patient Instructions (Signed)
Always great to see you

## 2021-08-26 ENCOUNTER — Encounter (INDEPENDENT_AMBULATORY_CARE_PROVIDER_SITE_OTHER): Payer: Self-pay

## 2021-08-27 LAB — COLOGUARD: COLOGUARD: POSITIVE — AB

## 2021-08-28 ENCOUNTER — Telehealth: Payer: Self-pay | Admitting: Family Medicine

## 2021-08-28 DIAGNOSIS — R195 Other fecal abnormalities: Secondary | ICD-10-CM

## 2021-08-28 DIAGNOSIS — C189 Malignant neoplasm of colon, unspecified: Secondary | ICD-10-CM | POA: Insufficient documentation

## 2021-08-28 NOTE — Telephone Encounter (Signed)
LVM for pt to CB regarding results.  

## 2021-08-28 NOTE — Telephone Encounter (Signed)
Please inform patient of positive cologuard testing.  A positive result does not necessarily mean cancer. It means that Cologuard detected DNA and/or  biomarkers in the stool which are associated with colon cancer or precancer.   Patients with a positive result need to have a diagnostic colonoscopy is soon as possible.  I have placed a referral to [facility] gastroenterology urgently.  If he would like referred to a different gastroenterology group or a specific gastroenterologist, tell him to let us know immediately so we can place a different referral.  If okay with [facility] gastroenterology.  Please also give him the contact information so he can call them within the next couple days if they do not reach out to him.   Gastroenterology/Endoscopy Phone: [phone]

## 2021-08-31 NOTE — Telephone Encounter (Signed)
Spoke with pt regarding labs and instructions.   

## 2021-09-01 ENCOUNTER — Encounter: Payer: Self-pay | Admitting: Gastroenterology

## 2021-09-02 ENCOUNTER — Ambulatory Visit (INDEPENDENT_AMBULATORY_CARE_PROVIDER_SITE_OTHER): Payer: Medicare PPO | Admitting: Family Medicine

## 2021-09-09 ENCOUNTER — Ambulatory Visit (INDEPENDENT_AMBULATORY_CARE_PROVIDER_SITE_OTHER): Payer: Medicare PPO | Admitting: Psychologist

## 2021-09-09 DIAGNOSIS — F411 Generalized anxiety disorder: Secondary | ICD-10-CM | POA: Diagnosis not present
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
## 2021-10-01 ENCOUNTER — Ambulatory Visit: Payer: Medicare PPO | Admitting: Family Medicine

## 2021-10-06 ENCOUNTER — Ambulatory Visit: Payer: Medicare PPO | Admitting: Cardiology

## 2021-10-06 ENCOUNTER — Encounter (INDEPENDENT_AMBULATORY_CARE_PROVIDER_SITE_OTHER): Payer: Self-pay | Admitting: Family Medicine

## 2021-10-06 ENCOUNTER — Ambulatory Visit (INDEPENDENT_AMBULATORY_CARE_PROVIDER_SITE_OTHER): Payer: Medicare PPO | Admitting: Family Medicine

## 2021-10-06 VITALS — BP 151/76 | HR 57 | Temp 98.4°F | Ht 75.0 in | Wt 255.0 lb

## 2021-10-06 DIAGNOSIS — I10 Essential (primary) hypertension: Secondary | ICD-10-CM | POA: Diagnosis not present

## 2021-10-06 DIAGNOSIS — F439 Reaction to severe stress, unspecified: Secondary | ICD-10-CM

## 2021-10-06 DIAGNOSIS — E669 Obesity, unspecified: Secondary | ICD-10-CM | POA: Diagnosis not present

## 2021-10-06 DIAGNOSIS — Z6831 Body mass index (BMI) 31.0-31.9, adult: Secondary | ICD-10-CM | POA: Diagnosis not present

## 2021-10-06 HISTORY — DX: Reaction to severe stress, unspecified: F43.9

## 2021-10-06 NOTE — Progress Notes (Unsigned)
[facility] Consult Note:  History: Clayton [name] 10/07/2021  Referring provider: Ma Hillock, DO  Reason for consult/chief complaint: No chief complaint on file.   Subjective  HPI: Clayton [name] is here for medical evaluation prior to considering screening colonoscopy.  He has a history of atrial fibrillation with prior ablation and is on chronic oral anticoagulation.  Reviewed the office note from his last cardiology visit in June of this year, his cardiac condition was stable.  He was describing increased fatigue, and a subsequent echocardiogram was stable from a prior study. 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 Humphrey Rolls; CPAP titration 11/12/04- Dr. Humphrey Rolls     Past Surgical History: Past Surgical History:  Procedure Laterality Date   ATRIAL FIBRILLATION ABLATION N/A 09/10/2020   Procedure: ATRIAL FIBRILLATION ABLATION;  Surgeon: Constance Haw, MD;  Location: [facility];  Service: Cardiovascular;  Laterality: N/A;   CARDIAC CATHETERIZATION  11/03/2005   CARDIOVERSION N/A 09/26/2018   Procedure: CARDIOVERSION;  Surgeon: Geralynn Rile, MD;  Location: [place] [facility];  Service: Endoscopy;  Laterality: N/A;   CARDIOVERSION N/A 03/13/2019   Procedure: CARDIOVERSION;  Surgeon: Skeet Latch, MD;  Location: [facility];  Service: Cardiovascular;  Laterality: N/A;   CARDIOVERSION N/A 03/03/2021   Procedure: CARDIOVERSION;  Surgeon: Josue Hector, MD;   Location: [facility] END[facility];  Service: Cardiovascular;  Laterality: N/A;   CATARACT EXTRACTION  01/19/2012   KNEE ARTHROSCOPY     left knee- 2002    TOTAL HIP ARTHROPLASTY  06/08/2011   Procedure: TOTAL HIP ARTHROPLASTY ANTERIOR APPROACH;  Surgeon: [name], MD;  Location: [facility];  Service: Orthopedics;  Laterality: Left;   TOTAL HIP ARTHROPLASTY Right 08/12/2020   ANTERIOR APPROACH   TOTAL HIP ARTHROPLASTY Right 08/12/2020   Procedure: TOTAL HIP ARTHROPLASTY ANTERIOR APPROACH;  Surgeon: Paralee Cancel, MD;  Location: [facility];  Service: Orthopedics;  Laterality: Right;     Family History: Family History  Problem Relation Age of Onset   Hypertension Mother    Stroke Mother    Diabetes Mother    Hyperlipidemia Mother    Obesity Mother    Diabetes Sister    Stroke Maternal Grandmother        54 died    Heart disease Maternal Grandfather    Hypertension Maternal Grandfather    Diabetes Maternal Grandfather    Arthritis Father    Heart disease Father    Heart disease Paternal Grandfather     Social History: Social History   Socioeconomic History   Marital status: Married    Spouse name: Charli Liberatore   Number of children: Not on file   Years of education: Not on file   Highest education level: Not on file  Occupational History   Occupation: Retired  Tobacco Use   Smoking status: Never   Smokeless tobacco: Never  Vaping  Use   Vaping Use: Never used  Substance and Sexual Activity   Alcohol use: No   Drug use: No   Sexual activity: Yes    Birth control/protection: None  Other Topics Concern   Not on file  Social History Narrative   Married. [name].Takes care of his mother-in-law as well.    Retired, Conservator, museum/gallery.    Drinks caffeine beverages. Takes a daily vitamin.   Exercises routinely.    Smoke detector in the home.    Ambulates independently.    Social Determinants of Health   Financial Resource Strain: Low Risk  (04/10/2021)   Overall Financial  Resource Strain (CARDIA)    Difficulty of Paying Living Expenses: Not hard at all  Food Insecurity: No Food Insecurity (04/10/2021)   Hunger Vital Sign    Worried About Running Out of Food in the Last Year: Never true    Ran Out of Food in the Last Year: Never true  Transportation Needs: No Transportation Needs (04/10/2021)   PRAPARE - Hydrologist (Medical): No    Lack of Transportation (Non-Medical): No  Physical Activity: Sufficiently Active (04/10/2021)   Exercise Vital Sign    Days of Exercise per Week: 7 days    Minutes of Exercise per Session: 50 min  Stress: No Stress Concern Present (04/10/2021)   [place]    Feeling of Stress : Not at all  Social Connections: Moderately Isolated (04/10/2021)   Social Connection and Isolation Panel [NHANES]    Frequency of Communication with Friends and Family: More than three times a week    Frequency of Social Gatherings with Friends and Family: Once a week    Attends Religious Services: Never    Marine scientist or Organizations: No    Attends Music therapist: Never    Marital Status: Married    Allergies: No Known Allergies  Outpatient Meds: Current Outpatient Medications  Medication Sig Dispense Refill   amLODipine (NORVASC) 5 MG tablet TAKE 1 AND 1/2 TABLETS BY MOUTH EVERY DAY (Patient taking differently: 5 mg.) 135 tablet 2   amoxicillin (AMOXIL) 500 MG capsule SMARTSIG:4 Capsule(s) By Mouth     Coenzyme Q10 200 MG capsule Take 200 mg by mouth in the morning.     ELIQUIS 5 MG TABS tablet TAKE 1 TABLET BY MOUTH TWICE A DAY 180 tablet 1   ezetimibe (ZETIA) 10 MG tablet Take 1 tablet (10 mg total) by mouth in the morning. 90 tablet 1   metFORMIN (GLUCOPHAGE) 500 MG tablet Take 1 tablet (500 mg total) by mouth daily with breakfast. 90 tablet 1   metoprolol succinate (TOPROL-XL) 100 MG 24 hr tablet Take 1 tablet (100 mg  total) by mouth daily. 90 tablet 1   Multiple Vitamins-Minerals (MULTIVITAMIN WITH MINERALS) tablet Take 1 tablet by mouth daily.     olmesartan (BENICAR) 40 MG tablet TAKE 1 TABLET BY MOUTH EVERY DAY 90 tablet 3   Pitavastatin Calcium (LIVALO) 2 MG TABS Take 1 tablet (2 mg total) by mouth every evening. 90 tablet 1   spironolactone (ALDACTONE) 25 MG tablet Take 0.5 tablets (12.5 mg total) by mouth daily. 45 tablet 1   tamsulosin (FLOMAX) 0.4 MG CAPS capsule Take 0.4 mg by mouth in the morning.     triamcinolone cream (KENALOG) 0.5 % APPLY 1 APPLICATION TOPICALLY 2 (TWO) TIMES DAILY. TO AFFECTED AREAS. 30 g 3   No current  facility-administered medications for this visit.      ___________________________________________________________________ Objective   Exam:  There were no vitals taken for this visit. Wt Readings from Last 3 Encounters:  10/06/21 255 lb (115.7 kg)  07/27/21 260 lb (117.9 kg)  07/22/21 254 lb 12.8 oz (115.6 kg)    General: ***  Eyes: sclera anicteric, no redness ENT: oral mucosa moist without lesions, no cervical or supraclavicular lymphadenopathy CV: ***, no JVD, no peripheral edema Resp: clear to auscultation bilaterally, normal RR and effort noted GI: soft, *** tenderness, with active bowel sounds. No guarding or palpable organomegaly noted. Skin; warm and dry, no rash or jaundice noted Neuro: awake, alert and oriented x 3. Normal gross motor function and fluent speech  Labs:  ***  Radiologic Studies:  Echocardiogram July 2023:  1. Left ventricular ejection fraction, by estimation, is 50 to 55%. Left  ventricular ejection fraction by 2D MOD biplane is 54.2 %. The left  ventricle has low normal function. The left ventricle has no regional wall  motion abnormalities. There is mild  left ventricular hypertrophy. Left ventricular diastolic parameters are  consistent with Grade I diastolic dysfunction (impaired relaxation).   2. Reduced RV free wall  strain at -19.4%. Right ventricular systolic  function is normal. The right ventricular size is normal. There is normal  pulmonary artery systolic pressure. The estimated right ventricular  systolic pressure is 79.8 mmHg.   3. Left atrial size was moderately dilated.   4. The mitral valve is abnormal. Trivial mitral valve regurgitation.   5. The aortic valve is tricuspid. Aortic valve regurgitation is not  visualized.   6. Aortic dilatation noted. There is mild dilatation of the aortic root,  measuring 43 mm. There is mild dilatation of the ascending aorta,  measuring 42 mm. There is moderate dilatation of the aortic arch,  measuring 45 mm.   7. The inferior vena cava is normal in size with greater than 50%  respiratory variability, suggesting right atrial pressure of 3 mmHg.    Assessment: No diagnosis found.  ***  Plan:  ***  Thank you for the courtesy of this consult.  Please call me with any questions or concerns.  Nelida Meuse III  CC: Referring provider noted above

## 2021-10-07 ENCOUNTER — Telehealth: Payer: Self-pay

## 2021-10-07 ENCOUNTER — Other Ambulatory Visit: Payer: Self-pay | Admitting: Gastroenterology

## 2021-10-07 ENCOUNTER — Encounter: Payer: Self-pay | Admitting: Gastroenterology

## 2021-10-07 ENCOUNTER — Ambulatory Visit: Payer: Medicare PPO | Admitting: Gastroenterology

## 2021-10-07 VITALS — BP 128/82 | HR 78 | Ht 75.0 in | Wt 260.0 lb

## 2021-10-07 DIAGNOSIS — R195 Other fecal abnormalities: Secondary | ICD-10-CM

## 2021-10-07 DIAGNOSIS — Z7902 Long term (current) use of antithrombotics/antiplatelets: Secondary | ICD-10-CM

## 2021-10-07 MED ORDER — NA SULFATE-K SULFATE-MG SULF 17.5-3.13-1.6 GM/177ML PO SOLN: 1.0000 | Freq: Once | ORAL | 0 refills | Status: AC

## 2021-10-07 NOTE — Telephone Encounter (Signed)
[facility] Pre-operative Risk Assessment     Request for surgical clearance:     Endoscopy Procedure  What type of surgery is being performed?     Colonoscopy   When is this surgery scheduled?     10-21-2021  What type of clearance is required ?   Pharmacy  Are there any medications that need to be held prior to surgery and how long? Eliquis, 2 evening prior to colonoscopy  Practice name and name of physician performing surgery?      [facility]  What is your office phone and fax number?      Phone- [phone]  Fax267 [phone]  Anesthesia type (None, local, MAC, general) ?       MAC

## 2021-10-07 NOTE — Patient Instructions (Addendum)
_______________________________________________________  If you are age 75 or older, your body mass index should be between 23-30. Your Body mass index is 32.5 kg/m. If this is out of the aforementioned range listed, please consider follow up with your Primary Care Provider.  If you are age 37 or younger, your body mass index should be between 19-25. Your Body mass index is 32.5 kg/m. If this is out of the aformentioned range listed, please consider follow up with your Primary Care Provider.   ________________________________________________________  The [facility] providers would like to encourage you to use [facility] to communicate with providers for non-urgent requests or questions.  Due to long hold times on the telephone, sending your provider a message by [facility] may be a faster and more efficient way to get a response.  Please allow 48 business hours for a response.  Please remember that this is for non-urgent requests.  _______________________________________________________  Clayton [name] have been scheduled for a colonoscopy. Please follow written instructions given to you at your visit today.  Please pick up your prep supplies at the pharmacy within the next 1-3 days. If you use inhalers (even only as needed), please bring them with you on the day of your procedure.  You will be contacted by our office prior to your procedure for directions on holding your ELIQUIS.  If you do not hear from our office 1 week prior to your scheduled procedure, please call [phone] to discuss.    Due to recent changes in healthcare laws, you may see the results of your imaging and laboratory studies on MyChart before your provider has had a chance to review them.  We understand that in some cases there may be results that are confusing or concerning to you. Not all laboratory results come back in the same time frame and the provider may be waiting for multiple results in order to interpret others.  Please give [name]  48 hours in order for your provider to thoroughly review all the results before contacting the office for clarification of your results.    It was a pleasure to see you today!  Thank you for trusting me with your gastrointestinal care!

## 2021-10-07 NOTE — Telephone Encounter (Signed)
Patient was seen in clinic today and scheduled for colonoscopy.  I am not sure why he was prescribed Clenpiq, but it was declined by his insurance.  Please order generic Suprep.  HD

## 2021-10-07 NOTE — Telephone Encounter (Signed)
I see.   OK, the change to Clenpiq is fine.  - HD

## 2021-10-09 ENCOUNTER — Other Ambulatory Visit: Payer: Self-pay | Admitting: Cardiovascular Disease

## 2021-10-09 DIAGNOSIS — I4819 Other persistent atrial fibrillation: Secondary | ICD-10-CM

## 2021-10-09 NOTE — Telephone Encounter (Signed)
Eliquis '5mg'$  refill request received. Patient is 76 years old, weight-117.9kg, Crea-1.32 on 08/05/2021, Diagnosis-Afib, and last seen by Oda Kilts on 07/08/2021. Dose is appropriate based on dosing criteria. Will send in refill to requested pharmacy.

## 2021-10-12 NOTE — Telephone Encounter (Signed)
Patient with diagnosis of A Fib on Eliquis for anticoagulation.    Procedure: colonscopy Date of procedure: 10/21/21   CHA2DS2-VASc Score = 3  This indicates a 3.2% annual risk of stroke. The patient's score is based upon: CHF History: 0 HTN History: 1 Diabetes History: 0 Stroke History: 0 Vascular Disease History: 0 Age Score: 2 Gender Score: 0   CrCl 67 mL/min using adjusted body weight Platelet count 198K   Per office protocol, patient can hold Eliquis for 2 days prior to procedure.     **This guidance is not considered finalized until pre-operative APP has relayed final recommendations.**

## 2021-10-12 NOTE — Telephone Encounter (Signed)
This still looks to me like Clenpiq is declined/not covered.  Please call the patient's pharmacy to work this out.  Generic Suprep would be the best and least expansive option, even if out of pocket.  - HD

## 2021-10-12 NOTE — Telephone Encounter (Signed)
   Patient Name: Clayton [name]  DOB: [birth year] MRN: [id number]  Primary Cardiologist: Shelva Majestic, MD  Clinical pharmacists have reviewed the patient's past medical history, labs, and current medications as part of preoperative protocol coverage. The following recommendations have been made:  Patient with diagnosis of A Fib on Eliquis for anticoagulation.     Procedure: colonscopy Date of procedure: 10/21/21     CHA2DS2-VASc Score = 3  This indicates a 3.2% annual risk of stroke. The patient's score is based upon: CHF History: 0 HTN History: 1 Diabetes History: 0 Stroke History: 0 Vascular Disease History: 0 Age Score: 2 Gender Score: 0   CrCl 67 mL/min using adjusted body weight Platelet count 198K     Per office protocol, patient can hold Eliquis for 2 days prior to procedure.     I will route this recommendation to the requesting party via Epic fax function and remove from pre-op pool.  Please call with questions.  Lenna Sciara, NP 10/12/2021, 2:58 PM

## 2021-10-12 NOTE — Telephone Encounter (Signed)
Left message to return call 

## 2021-10-13 ENCOUNTER — Ambulatory Visit (INDEPENDENT_AMBULATORY_CARE_PROVIDER_SITE_OTHER): Payer: Medicare PPO | Admitting: Psychologist

## 2021-10-13 DIAGNOSIS — F411 Generalized anxiety disorder: Secondary | ICD-10-CM

## 2021-10-13 NOTE — Telephone Encounter (Signed)
Patient returned your call, I advised him to hold his Eliquis 2 days prior to his colonoscopy. Patient verbalized understanding.

## 2021-10-13 NOTE — Progress Notes (Signed)
[facility] Counselor/Therapist Progress Note  Patient ID: SHAW DOBEK, MRN: [id number],    Date: 10/13/2021  Time Spent: 09:01 am to 09:21 am; total time: 20 minutes  This session was held via in person. The patient consented to in-person therapy and was in the clinician's office. Limits of confidentiality were discussed with the patient.   Treatment Type: Individual Therapy  Reported Symptoms: Denied concerns  Mental Status Exam: Appearance:  Well Groomed     Behavior: Appropriate  Motor: Normal  Speech/Language:  Normal Rate  Affect: Appropriate  Mood: normal  Thought process: normal  Thought content:   WNL  Sensory/Perceptual disturbances:   WNL  Orientation: oriented to person  Attention: Good  Concentration: Good  Memory: WNL  Fund of knowledge:  Good  Insight:   Fair  Judgment:  Good  Impulse Control: Good   Risk Assessment: Danger to Self:  No Self-injurious Behavior: No Danger to Others: No Duty to Warn:no Physical Aggression / Violence:No  Access to Firearms a concern: No  Gang Involvement:No   Subjective:  The patient described himself as doing well and denied any immediate concerns. Patient has a colonoscopy scheduled for next Wednesday. Depending on those test results, he may need counseling sooner rather than later. He processed thoughts and emotions. He denied suicidal and homicidal ideation.   Interventions: Worked on developing a therapeutic relationship with the patient using active listening and reflective statements. Provided emotional support using empathy and validation. Used summary statements. Reviewed events since the last session.  Praised the patient for doing well and explored what has assisted the patient. Used socratic questions to assist the patient. Reflected on thoughts and emotions related to upcoming colonoscopy. Normalized and validated emotions. Discussed next steps for counseling. Provided empathic statements.  Assessed for suicidal and homicidal ideation  Homework: Patient to undergo colonoscopy  Next Session: Emotional support review results from colonoscopy.   Diagnosis: F41.1 generalized anxiety disorder.   Plan:    Goals Reduce overall frequency, intensity, and duration of anxiety Stabilize anxiety level wile increasing ability to function Enhance ability to effectively cope with full variety of stressors Learn and implement coping skills that result in a reduction of anxiety   Objectives target date for all objectives is 10/14/2022 Verbalize an understanding of the cognitive, physiological, and behavioral components of anxiety Learning and implement calming skills to reduce overall anxiety Verbalize an understanding of the role that cognitive biases play in excessive irrational worry and persistent anxiety symptoms Identify, challenge, and replace based fearful talk Learn and implement problem solving strategies Identify and engage in pleasant activities Learning and implement personal and interpersonal skills to reduce anxiety and improve interpersonal relationships Learn to accept limitations in life and commit to tolerating, rather than avoiding, unpleasant emotions while accomplishing meaningful goals Identify major life conflicts from the past and present that form the basis for present anxiety Maintain involvement in work, family, and social activities Reestablish a consistent sleep-wake cycle Cooperate with a medical evaluation  Interventions Engage the patient in behavioral activation Use instruction, modeling, and role-playing to build the client's general social, communication, and/or conflict resolution skills Use Acceptance and Commitment Therapy to help client accept uncomfortable realities in order to accomplish value-consistent goals Reinforce the client's insight into the role of his/her past emotional pain and present anxiety  Support the client in following through  with work, family, and social activities Teach and implement sleep hygiene practices  Refer the patient to a physician for a psychotropic medication  consultation Monito the clint's psychotropic medication compliance Discuss how anxiety typically involves excessive worry, various bodily expressions of tension, and avoidance of what is threatening that interact to maintain the problem  Teach the patient relaxation skills Assign the patient homework Discuss examples demonstrating that unrealistic worry overestimates the probability of threats and underestimates patient's ability  Assist the patient in analyzing his or her worries Help patient understand that avoidance is reinforcing   The patient reviewed the treatment plan on 10/08/2020. The patient reevaluated the treatment plan on 10/13/2021 and approved of it.   Conception Chancy, PsyD

## 2021-10-13 NOTE — Progress Notes (Signed)
Chief Complaint:   OBESITY Clayton [name] is here to discuss his progress with his obesity treatment plan along with follow-up of his obesity related diagnoses. Clayton [name] is on following a lower carbohydrate, vegetable and lean protein rich diet plan and states he is following his eating plan approximately 50% of the time. Clayton [name] states he is riding the stationary bike for 35 minutes 5 times per week.  Today's visit was #: 32 Starting weight: 266 lbs Starting date: 11/01/2017 Today's weight: 255 lbs Today's date: 10/06/2021 Total lbs lost to date: 11 Total lbs lost since last in-office visit: 0  Interim History: Clayton [name] has been working on staying on top of his multiple health issues. He hasn't been able to be as strict on his low carbohydrate plan. He has done well with mostly maintaining his weight.   Subjective:   1. Stress Clayton [name] is dealing with some stress with his family and health issues. He is seeing a therapist and he is working on stress reduction techniques.   2. White coat syndrome with hypertension Clayton [name]'s blood pressure is mildly elevated today. He has had multiple elevated blood pressures in the last 6 months in our office. He is medications regularly and his blood pressure has been normal at home.   Assessment/Plan:   1. Stress We discussed emotional eating behaviors techniques today.   2. White coat syndrome with hypertension Clayton [name] will continue with his diet, exercise, and medications, and we will follow-up at his next visit.   3. Obesity, Current BMI 31.9 Clayton [name] is currently in the action stage of change. As such, his goal is to continue with weight loss efforts. He has agreed to following a lower carbohydrate, vegetable and lean protein rich diet plan.   Exercise goals: As is.   Behavioral modification strategies: increasing lean protein intake.  Clayton [name] has agreed to follow-up with our clinic in 3 to 4 weeks. He was informed of the importance of frequent  follow-up visits to maximize his success with intensive lifestyle modifications for his multiple health conditions.   Objective:   Blood pressure (!) 151/76, pulse (!) 57, temperature 98.4 F (36.9 C), height '6\' 3"'$  (1.905 m), weight 255 lb (115.7 kg), SpO2 97 %. Body mass index is 31.87 kg/m.  General: Cooperative, alert, well developed, in no acute distress. HEENT: Conjunctivae and lids unremarkable. Cardiovascular: Regular rhythm.  Lungs: Normal work of breathing. Neurologic: No focal deficits.   Lab Results  Component Value Date   CREATININE 1.32 08/05/2021   BUN 29 (H) 08/05/2021   NA 139 08/05/2021   K 4.7 08/05/2021   CL 108 08/05/2021   CO2 24 08/05/2021   Lab Results  Component Value Date   ALT 20 08/05/2021   AST 22 08/05/2021   ALKPHOS 55 08/05/2021   BILITOT 0.9 08/05/2021   Lab Results  Component Value Date   HGBA1C 6.0 08/05/2021   HGBA1C 5.8 (H) 07/04/2020   HGBA1C 5.8 (H) 08/01/2019   HGBA1C 5.8 (H) 09/04/2018   HGBA1C 6.0 (H) 11/01/2017   Lab Results  Component Value Date   INSULIN 3.9 08/01/2019   INSULIN 9.0 09/04/2018   INSULIN 22.4 11/01/2017   Lab Results  Component Value Date   TSH 1.010 07/08/2021   Lab Results  Component Value Date   CHOL 113 08/05/2021   HDL 48.40 08/05/2021   LDLCALC 55 08/05/2021   TRIG 50.0 08/05/2021   CHOLHDL 2 08/05/2021   Lab Results  Component Value Date   VD25OH 33.80  08/05/2021   VD25OH 39.95 01/24/2020   VD25OH 37.6 08/01/2019   Lab Results  Component Value Date   WBC 7.3 07/08/2021   HGB 13.8 07/08/2021   HCT 42.0 07/08/2021   MCV 88 07/08/2021   PLT 198 07/08/2021   Lab Results  Component Value Date   IRON 71 01/24/2020   FERRITIN 77.1 01/24/2020   Attestation Statements:   Reviewed by clinician on day of visit: allergies, medications, problem list, medical history, surgical history, family history, social history, and previous encounter notes.  Time spent on visit including  pre-visit chart review and post-visit care and charting was 35 minutes.   I, Trixie Dredge, am acting as transcriptionist for Dennard Nip, MD.  I have reviewed the above documentation for accuracy and completeness, and I agree with the above. -  Dennard Nip, MD

## 2021-10-14 ENCOUNTER — Telehealth: Payer: Self-pay | Admitting: Gastroenterology

## 2021-10-14 NOTE — Telephone Encounter (Signed)
Inbound call from patient states his insurance doesn't cover the prep rx, he says he doesn't mind paying full price if he has to. Please advise.

## 2021-10-15 NOTE — Telephone Encounter (Signed)
Patient contacted. He will try to use if they will run the Suprep with a goodrx card. He will call back if he has any additional problems and change the prep to [employer].

## 2021-10-21 ENCOUNTER — Ambulatory Visit (AMBULATORY_SURGERY_CENTER): Payer: Medicare PPO | Admitting: Gastroenterology

## 2021-10-21 ENCOUNTER — Encounter: Payer: Self-pay | Admitting: Gastroenterology

## 2021-10-21 ENCOUNTER — Ambulatory Visit: Payer: Medicare PPO | Admitting: Family Medicine

## 2021-10-21 VITALS — BP 146/85 | HR 74 | Temp 97.8°F | Resp 17 | Ht 75.0 in | Wt 260.0 lb

## 2021-10-21 DIAGNOSIS — D122 Benign neoplasm of ascending colon: Secondary | ICD-10-CM

## 2021-10-21 DIAGNOSIS — K6389 Other specified diseases of intestine: Secondary | ICD-10-CM

## 2021-10-21 DIAGNOSIS — D124 Benign neoplasm of descending colon: Secondary | ICD-10-CM

## 2021-10-21 DIAGNOSIS — D123 Benign neoplasm of transverse colon: Secondary | ICD-10-CM | POA: Diagnosis not present

## 2021-10-21 DIAGNOSIS — Z1211 Encounter for screening for malignant neoplasm of colon: Secondary | ICD-10-CM | POA: Diagnosis not present

## 2021-10-21 DIAGNOSIS — C182 Malignant neoplasm of ascending colon: Secondary | ICD-10-CM | POA: Diagnosis not present

## 2021-10-21 DIAGNOSIS — R195 Other fecal abnormalities: Secondary | ICD-10-CM

## 2021-10-21 MED ORDER — SODIUM CHLORIDE 0.9 % IV SOLN: 500.0000 mL | Freq: Once | INTRAVENOUS | Status: DC

## 2021-10-21 NOTE — Progress Notes (Signed)
Pt's states no medical or surgical changes since previsit or office visit. 

## 2021-10-21 NOTE — Progress Notes (Signed)
PT taken to PACU. Monitors in place. VSS. Report given to RN. 

## 2021-10-21 NOTE — Progress Notes (Signed)
No changes to clinical history since GI office visit on 10/07/21.  The patient is appropriate for an endoscopic procedure in the ambulatory setting.  - Wilfrid Lund, MD

## 2021-10-21 NOTE — Op Note (Signed)
[facility] Patient Name: Clayton [name] Procedure Date: 10/21/2021 3:22 PM MRN: [id number] Endoscopist: Mallie Mussel L. Loletha Carrow , MD Age: 76 Referring MD:  Date of Birth: Nov 06, 1945 Gender: Male Account #: [id number] Procedure:                Colonoscopy Indications:              Positive Cologuard test (which was patient's first                            CRC screening test) Medicines:                Monitored Anesthesia Care Procedure:                Pre-Anesthesia Assessment:                           - Prior to the procedure, a History and Physical                            was performed, and patient medications and                            allergies were reviewed. The patient's tolerance of                            previous anesthesia was also reviewed. The risks                            and benefits of the procedure and the sedation                            options and risks were discussed with the patient.                            All questions were answered, and informed consent                            was obtained. Prior Anticoagulants: The patient has                            taken Eliquis (apixaban), last dose was 2 days                            prior to procedure. ASA Grade Assessment: III - A                            patient with severe systemic disease. After                            reviewing the risks and benefits, the patient was                            deemed in satisfactory condition to undergo the  procedure.                           After obtaining informed consent, the colonoscope                            was passed under direct vision. Throughout the                            procedure, the patient's blood pressure, pulse, and                            oxygen saturations were monitored continuously. The                            Olympus CF-HQ190L [id number]) Colonoscope was                             introduced through the anus and advanced to the the                            cecum, identified by appendiceal orifice and                            ileocecal valve. The colonoscopy was performed                            without difficulty. The patient tolerated the                            procedure well. The quality of the bowel                            preparation was good. The ileocecal valve,                            appendiceal orifice, and rectum were photographed. Scope In: 3:25:43 PM Scope Out: 3:50:47 PM Scope Withdrawal Time: 0 hours 21 minutes 11 seconds  Total Procedure Duration: 0 hours 25 minutes 4 seconds  Findings:                 The perianal and digital rectal examinations were                            normal.                           A sessile non-obstructing mass was found in the mid                            ascending colon. The mass was non-circumferential.                            In addition, its diameter measured approximately  forty mm. Biopsies were taken with a cold forceps                            for histology (firm and friable tissue). Area was                            tattooed with an injection of 1 mL of Spot - two                            0.25m injections on opposite walls approx 5-7 cm                            distal to the mass (tattoos at the hepatic                            flexure). There were several small polyps close to                            the mass that were not removed.                           Three sessile polyps were found in the distal                            transverse colon (1) and hepatic flexure (2). The                            polyps were 3 to 6 mm in size. These polyps were                            removed with a cold snare. Resection and retrieval                            were complete.                           A 12-15 mm polyp was found in the descending colon.                             The polyp was pedunculated. The polyp was removed                            with a hot snare. Resection and retrieval were                            complete.                           Multiple diverticula were found in the left colon.                           Internal hemorrhoids were found.  The exam was otherwise without abnormality on                            direct and retroflexion views. Complications:            No immediate complications. Estimated Blood Loss:     Estimated blood loss was minimal. Impression:               - Rule out malignancy, mass in the mid ascending                            colon. Biopsied. Tattooed.                           - Three 3 to 6 mm polyps in the distal transverse                            colon and at the hepatic flexure, removed with a                            cold snare. Resected and retrieved.                           - One 12-15 mm polyp in the descending colon,                            removed with a hot snare. Resected and retrieved.                           - Diverticulosis in the left colon.                           - Internal hemorrhoids.                           - The examination was otherwise normal on direct                            and retroflexion views. Recommendation:           - Patient has a contact number available for                            emergencies. The signs and symptoms of potential                            delayed complications were discussed with the                            patient. Return to normal activities tomorrow.                            Written discharge instructions were provided to the                            patient.                           -  Resume previous diet.                           - Resume Eliquis (apixaban) at prior dose in 2 days.                           - Await pathology results.                           -  Repeat colonoscopy is recommended for                            surveillance. The colonoscopy date will be                            determined after pathology results from today's                            exam become available for review. [name] L. Loletha Carrow, MD 10/21/2021 4:00:55 PM This report has been signed electronically.

## 2021-10-21 NOTE — Patient Instructions (Signed)
Resume previous diet. - Resume Eliquis (apixaban) at prior dose in 2 days. - Await pathology results. - Repeat colonoscopy is recommended for surveillance. The colonoscopy date will be determined after pathology results from today's exam become available for review  YOU HAD AN ENDOSCOPIC PROCEDURE TODAY: Refer to the procedure report and other information in the discharge instructions given to you for any specific questions about what was found during the examination. If this information does not answer your questions, please call [facility] office at [phone] to clarify.   YOU SHOULD EXPECT: Some feelings of bloating in the abdomen. Passage of more gas than usual. Walking can help get rid of the air that was put into your GI tract during the procedure and reduce the bloating. If you had a lower endoscopy (such as a colonoscopy or flexible sigmoidoscopy) you may notice spotting of blood in your stool or on the toilet paper. Some abdominal soreness may be present for a day or two, also.  DIET: Your first meal following the procedure should be a light meal and then it is ok to progress to your normal diet. A half-sandwich or bowl of soup is an example of a good first meal. Heavy or fried foods are harder to digest and may make you feel nauseous or bloated. Drink plenty of fluids but you should avoid alcoholic beverages for 24 hours. If you had a esophageal dilation, please see attached instructions for diet.    ACTIVITY: Your care partner should take you home directly after the procedure. You should plan to take it easy, moving slowly for the rest of the day. You can resume normal activity the day after the procedure however YOU SHOULD NOT DRIVE, use power tools, machinery or perform tasks that involve climbing or major physical exertion for 24 hours (because of the sedation medicines used during the test).   SYMPTOMS TO REPORT IMMEDIATELY: A gastroenterologist can be reached at any hour. Please call  [phone]  for any of the following symptoms:  Following lower endoscopy (colonoscopy, flexible sigmoidoscopy) Excessive amounts of blood in the stool  Significant tenderness, worsening of abdominal pains  Swelling of the abdomen that is new, acute  Fever of 100 or higher   FOLLOW UP:  If any biopsies were taken you will be contacted by phone or by letter within the next 1-3 weeks. Call [phone]  if you have not heard about the biopsies in 3 weeks.  Please also call with any specific questions about appointments or follow up tests.

## 2021-10-22 ENCOUNTER — Telehealth: Payer: Self-pay | Admitting: *Deleted

## 2021-10-22 ENCOUNTER — Telehealth: Payer: Self-pay

## 2021-10-22 DIAGNOSIS — R195 Other fecal abnormalities: Secondary | ICD-10-CM

## 2021-10-22 DIAGNOSIS — H90A22 Sensorineural hearing loss, unilateral, left ear, with restricted hearing on the contralateral side: Secondary | ICD-10-CM | POA: Diagnosis not present

## 2021-10-22 DIAGNOSIS — H903 Sensorineural hearing loss, bilateral: Secondary | ICD-10-CM | POA: Diagnosis not present

## 2021-10-22 DIAGNOSIS — H8102 Meniere's disease, left ear: Secondary | ICD-10-CM | POA: Diagnosis not present

## 2021-10-22 DIAGNOSIS — Z9621 Cochlear implant status: Secondary | ICD-10-CM | POA: Diagnosis not present

## 2021-10-22 DIAGNOSIS — K6389 Other specified diseases of intestine: Secondary | ICD-10-CM

## 2021-10-22 DIAGNOSIS — C182 Malignant neoplasm of ascending colon: Secondary | ICD-10-CM

## 2021-10-22 NOTE — Telephone Encounter (Signed)
[place],  I spoke with Clayton [name] about the pathology results, he understands that his cancer and that we need to arrange lab work, scans and surgical consultation.  Please contact him and arrange the following:  CBC, CMP, CEA (at our office lab)  CT scan chest, abdomen and pelvis with oral and IV contrast. Indication: Ascending colon cancer  Referral to Drs. Leighton Ruff or Nadeen Landau at [facility] for ascending colon cancer  Set a 1 year recall for colonoscopy  Thank you  - HD  ___________________________  Dr. Raoul Pitch,  Clayton [name] on your patient. Great job convincing him to have a Cologuard test.  -Wilfrid Lund, [facility]

## 2021-10-22 NOTE — Telephone Encounter (Signed)
  Follow up Call-     10/21/2021    1:44 PM  Call back number  Post procedure Call Back phone  # [phone]  Permission to leave phone message Yes     Patient questions:  Do you have a fever, pain , or abdominal swelling? No. Pain Score  0 *  Have you tolerated food without any problems? Yes.    Have you been able to return to your normal activities? Yes.    Do you have any questions about your discharge instructions: Diet   No. Medications  No. Follow up visit  No.  Do you have questions or concerns about your Care? No.  Actions: * If pain score is 4 or above: No action needed, pain <4.

## 2021-10-22 NOTE — Telephone Encounter (Signed)
Received a call from Dr. Saralyn Pilar with pathology. Biopsy of ascending colon mass is consistent with adenocarcinoma.

## 2021-10-23 ENCOUNTER — Encounter: Payer: Self-pay | Admitting: Cardiovascular Disease

## 2021-10-23 ENCOUNTER — Encounter: Payer: Self-pay | Admitting: Family Medicine

## 2021-10-23 NOTE — Telephone Encounter (Signed)
Patient scheduled for CT scan on 10/11 at [facility]

## 2021-10-23 NOTE — Progress Notes (Signed)
Clayton [name] [facility] [place] Phone: [phone] Subjective:    I'm seeing this patient by the request  of:  Kuneff, Renee A, DO  CC: Back and neck pain follow-up  WGN:[id number]  Clayton [name] is a 76 y.o. male coming in with complaint of back and neck pain. OMT 08/20/2021. Patient states that his L knee and lumbar spine pain are still present and slightly higher due to stress. Has been able to golf but gets stiff.  L knee is doing well.  Since we have seen patient patient has had a colonoscopy and was found to have an adenocarcinoma of the ascending colon.  Patient is scheduled for a CT abdomen, chest and pelvis in 2 days. Medications patient has been prescribed: None          Reviewed prior external information including notes and imaging from previsou exam, outside providers and external EMR if available.   As well as notes that were available from care everywhere and other healthcare systems.  Past medical history, social, surgical and family history all reviewed in electronic medical record.  No pertanent information unless stated regarding to the chief complaint.   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 Humphrey Rolls; CPAP titration 11/12/04- Dr. Humphrey Rolls    No Known Allergies   Review of Systems:  No headache, visual changes, nausea, vomiting, diarrhea, constipation, dizziness, abdominal pain, skin rash, fevers, chills, night sweats, weight loss, swollen lymph nodes, body aches, joint swelling,  chest pain, shortness of breath, mood changes. POSITIVE muscle aches but overall feels relatively well  Objective  Blood pressure (!) 142/92, pulse 80, height '6\' 3"'$  (1.905 m), weight 260 lb (117.9 kg), SpO2 98 %.   General: No apparent distress alert and oriented x3 mood and affect normal, dressed appropriately.  HEENT: Pupils equal, extraocular movements intact  Respiratory: Patient's speak in full sentences and does not appear short of breath  Cardiovascular: No lower extremity edema, non tender, no erythema  Gait mild antalgic MSK:  Back does have some loss of lordosis.  Tightness noted in the paraspinal musculature.  Still has some difficulty on the right side of the hip noted.  Osteopathic findings  C2 flexed rotated and side bent right C6 flexed rotated and side bent left T3 extended rotated and side bent right inhaled rib T9 extended rotated and side bent left L2 flexed rotated and side bent right Sacrum right on right       Assessment and Plan:  Degenerative disc disease, lumbar Degenerative disc disease.  Patient did respond well to osteopathic manipulation.  CT scans are pending.  The patient did HVLA both secondary to patient's with most recent diagnosis of the adenocarcinoma until we know more at the moment.  Patient will get other laboratory work-up today for hopefully for proper staging of the adenocarcinoma.  We will move forward with this and patient can see me potentially 6  weeks after any surgical intervention that would be necessary.  Total time discussing with patient and reviewing patient's imaging with including colonoscopy 33 minutes    Nonallopathic problems  Decision today to treat with OMT was based on Physical Exam  After verbal consent patient was treated with HVLA, ME, FPR techniques in cervical, rib, thoracic, lumbar, and sacral  areas  Patient tolerated the procedure well with improvement in symptoms  Patient given exercises, stretches and  lifestyle modifications  See medications in patient instructions if given  Patient will follow up in 4-8 weeks    The above documentation has been reviewed and is accurate and complete [name], DO          Note: This dictation was prepared with Dragon dictation along with smaller phrase technology. Any transcriptional errors that result from this process are unintentional.

## 2021-10-23 NOTE — Addendum Note (Signed)
Addended by: Yevette Edwards on: 10/23/2021 09:22 AM   Modules accepted: Orders

## 2021-10-23 NOTE — Telephone Encounter (Signed)
Lab orders in epic. CT order in epic. Urgent secure staff message sent to radiology scheduling to contact patient to set up appt.  Urgent referral, records, patient's demographics, and insurance information have been faxed to [facility] at [phone]. 1 year [place] recall colonoscopy in epic.  Called and spoke with patient's wife. She is aware that lab orders have been placed and patient can stop by our lab at his convenience. No appt necessary, I provided wife with lab hours. Opal Sidles knows that patient should expect a call from radiology scheduling to set up his CT scan and knows to expect a call from [facility] to set up surgical consultation. Opal Sidles is aware that Dr. Loletha Carrow will repeat colonoscopy in 1 year. Opal Sidles states that she took all of the information down, but she will have patient call back to discuss as well. It will make him feel better to speak with me directly. I will await his call. Opal Sidles did verbalized understanding of all information.

## 2021-10-26 ENCOUNTER — Ambulatory Visit: Payer: Medicare PPO | Attending: Cardiovascular Disease | Admitting: Cardiovascular Disease

## 2021-10-26 ENCOUNTER — Other Ambulatory Visit (INDEPENDENT_AMBULATORY_CARE_PROVIDER_SITE_OTHER): Payer: Medicare PPO

## 2021-10-26 ENCOUNTER — Encounter: Payer: Self-pay | Admitting: Family Medicine

## 2021-10-26 ENCOUNTER — Ambulatory Visit: Payer: Medicare PPO | Admitting: Family Medicine

## 2021-10-26 VITALS — BP 136/94 | HR 77 | Wt 256.2 lb

## 2021-10-26 VITALS — BP 142/92 | HR 80 | Ht 75.0 in | Wt 260.0 lb

## 2021-10-26 DIAGNOSIS — K6389 Other specified diseases of intestine: Secondary | ICD-10-CM | POA: Diagnosis not present

## 2021-10-26 DIAGNOSIS — M9908 Segmental and somatic dysfunction of rib cage: Secondary | ICD-10-CM | POA: Diagnosis not present

## 2021-10-26 DIAGNOSIS — I480 Paroxysmal atrial fibrillation: Secondary | ICD-10-CM

## 2021-10-26 DIAGNOSIS — M2550 Pain in unspecified joint: Secondary | ICD-10-CM

## 2021-10-26 DIAGNOSIS — R195 Other fecal abnormalities: Secondary | ICD-10-CM | POA: Diagnosis not present

## 2021-10-26 DIAGNOSIS — M9903 Segmental and somatic dysfunction of lumbar region: Secondary | ICD-10-CM | POA: Diagnosis not present

## 2021-10-26 DIAGNOSIS — G4733 Obstructive sleep apnea (adult) (pediatric): Secondary | ICD-10-CM

## 2021-10-26 DIAGNOSIS — I10 Essential (primary) hypertension: Secondary | ICD-10-CM

## 2021-10-26 DIAGNOSIS — R7303 Prediabetes: Secondary | ICD-10-CM

## 2021-10-26 DIAGNOSIS — M9901 Segmental and somatic dysfunction of cervical region: Secondary | ICD-10-CM

## 2021-10-26 DIAGNOSIS — M9904 Segmental and somatic dysfunction of sacral region: Secondary | ICD-10-CM

## 2021-10-26 DIAGNOSIS — C182 Malignant neoplasm of ascending colon: Secondary | ICD-10-CM

## 2021-10-26 DIAGNOSIS — M9902 Segmental and somatic dysfunction of thoracic region: Secondary | ICD-10-CM

## 2021-10-26 DIAGNOSIS — M5136 Other intervertebral disc degeneration, lumbar region: Secondary | ICD-10-CM

## 2021-10-26 DIAGNOSIS — E669 Obesity, unspecified: Secondary | ICD-10-CM | POA: Diagnosis not present

## 2021-10-26 DIAGNOSIS — Z7901 Long term (current) use of anticoagulants: Secondary | ICD-10-CM

## 2021-10-26 LAB — COMPREHENSIVE METABOLIC PANEL
ALT: 20 U/L (ref 0–53)
AST: 21 U/L (ref 0–37)
Albumin: 4.4 g/dL (ref 3.5–5.2)
Alkaline Phosphatase: 63 U/L (ref 39–117)
BUN: 28 mg/dL — ABNORMAL HIGH (ref 6–23)
CO2: 22 mEq/L (ref 19–32)
Calcium: 10.5 mg/dL (ref 8.4–10.5)
Chloride: 107 mEq/L (ref 96–112)
Creatinine, Ser: 1.35 mg/dL (ref 0.40–1.50)
GFR: 51.47 mL/min — ABNORMAL LOW (ref >60.00)
Glucose, Bld: 97 mg/dL (ref 70–99)
Potassium: 4.5 mEq/L (ref 3.5–5.1)
Sodium: 138 mEq/L (ref 135–145)
Total Bilirubin: 0.7 mg/dL (ref 0.2–1.2)
Total Protein: 7.3 g/dL (ref 6.0–8.3)

## 2021-10-26 LAB — CBC WITH DIFFERENTIAL/PLATELET
Basophils Absolute: 0 10*3/uL (ref 0.0–0.1)
Basophils Relative: 0.6 % (ref 0.0–3.0)
Eosinophils Absolute: 0 10*3/uL (ref 0.0–0.7)
Eosinophils Relative: 0.7 % (ref 0.0–5.0)
HCT: 33.2 % — ABNORMAL LOW (ref 39.0–52.0)
Hemoglobin: 10.9 g/dL — ABNORMAL LOW (ref 13.0–17.0)
Lymphocytes Relative: 15.9 % (ref 12.0–46.0)
Lymphs Abs: 0.7 10*3/uL (ref 0.7–4.0)
MCHC: 33 g/dL (ref 30.0–36.0)
MCV: 79.1 fl (ref 78.0–100.0)
Monocytes Absolute: 0.5 10*3/uL (ref 0.1–1.0)
Monocytes Relative: 11.1 % (ref 3.0–12.0)
Neutro Abs: 3.3 10*3/uL (ref 1.4–7.7)
Neutrophils Relative %: 71.7 % (ref 43.0–77.0)
Platelets: 215 10*3/uL (ref 150.0–400.0)
RBC: 4.19 Mil/uL — ABNORMAL LOW (ref 4.22–5.81)
RDW: 16 % — ABNORMAL HIGH (ref 11.5–15.5)
WBC: 4.6 10*3/uL (ref 4.0–10.5)

## 2021-10-26 LAB — IBC PANEL
Iron: 23 ug/dL — ABNORMAL LOW (ref 42–165)
Saturation Ratios: 4.6 % — ABNORMAL LOW (ref 20.0–50.0)
TIBC: 501.2 ug/dL — ABNORMAL HIGH (ref 250.0–450.0)
Transferrin: 358 mg/dL (ref 212.0–360.0)

## 2021-10-26 LAB — SEDIMENTATION RATE: Sed Rate: 27 mm/hr — ABNORMAL HIGH (ref 0–20)

## 2021-10-26 LAB — FERRITIN: Ferritin: 7.4 ng/mL — ABNORMAL LOW (ref 22.0–322.0)

## 2021-10-26 MED ORDER — AMLODIPINE BESYLATE 5 MG PO TABS: 7.5000 mg | ORAL_TABLET | Freq: Every day | ORAL | 2 refills | Status: DC

## 2021-10-26 NOTE — Patient Instructions (Addendum)
See me 6 weeks after surgery Labs at [facility]

## 2021-10-26 NOTE — Assessment & Plan Note (Signed)
Degenerative disc disease.  Patient did respond well to osteopathic manipulation.  CT scans are pending.  The patient did HVLA both secondary to patient's with most recent diagnosis of the adenocarcinoma until we know more at the moment.  Patient will get other laboratory work-up today for hopefully for proper staging of the adenocarcinoma.  We will move forward with this and patient can see me potentially 6 weeks after any surgical intervention that would be necessary.  Total time discussing with patient and reviewing patient's imaging with including colonoscopy 33 minutes

## 2021-10-26 NOTE — Progress Notes (Signed)
Patient ID: MARGUERITE JARBOE, male   DOB: 09/20/1945, 76 y.o.   MRN: [id number]        HPI: RAYSHAUN NEEDLE is a 76 y.o. male who presents for a 9 month follow-up cardiology evaluation.  Mr. Askren has a history of hypertension, a remote history of PAF, status post cardioversion in 2007, obstructive sleep apnea for which he uses CPAP 100% of the time, hypertension, and hyperlipidemia. He has had weight fluctuations over the years.  In the past, he also had a history of Mnire's disease.  There was some concern by [name] that perhaps this may have been related to statin use, which led to its discontinuance.  He is not had Mnire's disease in some time.  He underwent left hip replacement surgery by Dr. [name] and now has been able to continue to be active and exercises routinely.   His  blood pressure regimen has been amlodipine/benazepril 10/40 daily, hydrochlorothiazide has been taking 25 Mill grams in the evening and Toprol-XL 100 mg daily. He had atrial of livalo  For hyperlipidemia but developed some diarrhea secondary to this.    He has not had much success with weight loss over the past several years.    He continues to use his CPAP therapy with 100% compliance.  He will not even take a nap without his CPAP therapy and he takes his CPAP unit on alll his travels.   Since initiating CPAP therapy, he is unaware of any palpitations.  He denies any recurrent atrial fibrillation.  In the past he has had difficulty with family stress and his father had passed away, and his wife's mother recently died after having developed lung CA and had significant PVD.  He was not exercising as much as he had in the past.  He has not been successful in weight loss.  He continues to be active and still walks 3-4 miles per day.  Oftentimes in the morning when he awakens his blood pressure is elevated at 150/90 but after exercise it drops to 128/80.  He is unaware of any recurrent atrial fibrillation.  He  continues to use CPAP with 100% compliance.   He completed a two-year grieving process.  On 01/19/2016 he committed to weight loss.  On January 1, he weighed 278 pounds .  He has been walking 4 miles 5 days per week as well as some intermittent stationary bike.  He has lost a total of 32 pounds since January.  He continues to use CPAP with 100% compliance and since he has been on CPAP, he has not had any recurrent atrial fibrillation and his blood pressure has been less labile.  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  He has had failures to Crestor and Lipitor in the past due to significant myalgias, development, even with weekly dosing.  I resumed zetia   In August 2018 he noticed his heart rate increasing and he presented for evaluation and was seen by [name], PAC.  He admitted to a rare palpitation.  He denied any chest pain with exertion or dyspnea on exertion.  He has suffered a broken rib.  This year.  He has a weight goal of 220 pounds.    I saw him in September 2018, at which time he was doing well from a cardiovascular standpoint.   His father was ill and ultimately passed away in [place].  As result he was going back and forth to the [facility] area.  This resulted in a change in his diet and ability to exercise.  I last saw him in October 2019 and his weight had increased from 240 back up to 270 pounds.  He was unaware of any episodes of atrial fibrillation.  He was using CPAP with 100% compliance along with his naps.  He was committed to begin weight loss again.    Since I saw him on October 19, 2018 he has had major lifestyle change.  He has been working with Dr. Leafy Ro in the healthy weight and wellness group.  Peak weight was 280 and most recent weight 244.  I saw him for a cardiology evaluation on August 09, 2018.  O ver the past 4 months, he had been under increased stress as result of his wife's illness.  He has been checking his blood pressure regularly and this has  been stable but on his blood pressure recording he has noticed the message of heart rate irregularity.  He is unaware of A. fib but has noticed this message for the last 3 to 4 months.  He is exercising every day and typically walks 2 miles in the morning and in the afternoon or evening does 30 minutes of stationary bike at least 5 to 6 days/week.  He continues to use CPAP.  I obtained a download in the office today from June 22 through August 08, 2018.  He is 100% compliant.  He has a ResMed air sense 10 auto unit with a pressure range of 8-20 with 95% pressure at 10.1 and maximum average and 11.5.  AHI is excellent at 1.2.     During his August 09, 2018 encounter his ECG demonstrated that he was back in atrial fibrillation and had a ventricular rate at 89 bpm.  There was LVH with repolarization changes.  At that time, I recommended discontinuance of amlodipine/benazepril combination and in its place started Cardizem CD 180 mg rather than amlodipine and olmesartan 20 mg rather than benazepril.  I initiated anticoagulation with Eliquis 5 mg twice a day.  He underwent a 2D echo Doppler study on August 16, 2018 which showed EF low normal at 50 to 55%.  There was evidence for significant LVH and moderate dilation of his left atrium.  I saw him for follow-up evaluation on August 23, 2018.  At that time he was now cognizant of his heart rate irregularity.  He has continued to use CPAP with excellent compliance.  A new download was obtained from July 5 through August 21, 2018 which shows 100% compliance with average usage 7 hours and 28 minutes.  AHI is 1.6 and his 95th percentile auto pressure is 10.1 with a maximum average pressure of 11.4.  He states he has lost weight over the past several weeks purposefully.  He continues to exercise now on a stationary bike.  He denies any chest pain PND orthopnea.  He was tolerating Eliquis without bleeding.  During that evaluation, we discussed different options including several  additional weeks of increased weight control versus initiating antiarrhythmic therapy.  Since his only other episode of atrial fibrillation previously occurred 13 years ago and he had not had any recurrence until this year he opted for an increased rate control trial.  As result metoprolol dose was further titrated.  I saw on September 15, 2018 for follow-up evaluation.  At that time remained in atrial fibrillation despite his increase metoprolol succinate to 100 mg in the morning and 50 mg at night with continuation of  diltiazem 180 mg, spironolactone 12.5 mg daily in addition to his olmesartan 20 mg.  I scheduled him for DC cardioversion but because of the need to obtain a COVID test I was unable to schedule this to be done by me prior to going on vacation.  He ultimately underwent the successful cardioversion by Dr. Davina Poke successfully and received 1 shock of 200 J of biphasic synchronized rhythm with restoration of sinus rhythm.  Chemistry profile done prior to the cardioversion revealed his creatinine had increased to 1.69 and as result I recommended he reduce his olmesartan down to 10 mg from his dose of 20 mg.  I saw him on October 10, 2018 in follow-up of his cardioversion.  At that time he was maintaining sinus rhythm and feeling well.  He had more energy.  His stress level had significantly reduced since his wife had successful L5-S1 back surgery by Dr. Vertell Limber.  He was continuing to use CPAP with 100% compliance.    I evaluated him on January 09, 2019.  At that time he stated that over the 3 months previous he had continued to feel well and remained asymptomatic.  At times there are still periods of increased stress.  He continued to use CPAP with 100% compliance.  A download was obtained from November 22 through January 08, 2019 which showed 100% compliance.  Average usage was 6 hours 47 minutes.  AHI is excellent at 1.4 with his AutoSet CPAP minimum pressure set at 8 and maximum of 20, with 95th  percentile pressure 10.2 with maximum average pressure 12.0.  During his evaluation, his ECG verify that he was back in atrial fibrillation.  At the time, he did not inform me that he had noted some irregularity but retrospectively this may have been going on for several weeks prior to that evaluation.  He was continuing to walk daily and exercising on his bike and was asymptomatic without shortness of breath.  During that evaluation I had a long discussion with him regarding possible EP evaluation for consideration of ablation.  After his significant discussion elected to initiate an attempt at antiarrhythmic therapy with low-dose flecainide initially at 50 mg twice a day with plans for office visit in 2 weeks.  He has continued to be on Eliquis for anticoagulation in addition to Zetia for his hyperlipidemia.  When I saw him on January 24, 2019 he stated that he had felt some fatigue once flecainide was instituted.  He was feeling well but this past Sunday he had been working very hard going up and down numerous steps carrying boxes with heavy exertion making at least 40 trips carrying Christmas girls back upstairs.  He was tired.  Sunday evening while sitting down he became disoriented anxious and it appeared that there was possibly some transient stress of aphasia.  He denied any focal weakness.  His symptoms ultimately resolved after 30 minutes.  He had called the office during the workweek and was concerned about his symptoms possibly secondary to flecainide.  He had self reduced his flecainide dose to just once a day rather than twice a day and is worked into my schedule today for follow-up evaluation.  Presently, he has no residual issues since that event several days ago.    At his January 2021 evaluation, I recommended he undergo a neurologic evaluation with Dr. Leonie Man as well as discussed an EP evaluation for possible consideration of future atrial fibrillation ablation.  He saw Dr. Curt Bears on February 08, 2019  and was in persistent atrial fibrillation.  Was discussion concerning continuing flecainide with subsequent high voltage cardioversion.  He saw Dr. Leonie Man February 27, 2019 and after a long evaluation it was felt that his transient episode of word finding difficulty and anomia likely represented mild cognitive impairment rather than TIA or seizure.  He subsequently saw Dr Curt Bears back on March 05, 2019 and on 323 he underwent successful cardioversion with Dr. Oval Linsey which required 3 shocks with restoration of sinus rhythm.  He was evaluated by me in March 2021 at which time he felt his heart rhythm had remained stable.    He was under considerable increased stress to his wife had fallen in sustained a fracture to her humerus as well as tear to her rotator cuff and biceps tendon.  Ul diltiazem was discontinued and he was started on amlodipine 5 mg and was told to continue olmesartan 20 mg in addition to his metoprolol and spironolactone.  Timately she underwent surgery.  He denies chest pain PND orthopnea.  He has continued to use CPAP download from March 05, 2019 through April 03, 2019 continues to show excellent compliance with an AHI of 2.7 and 95th percentile pressure 11.4 cm.   Since I last saw him, he was evaluated by Roby Lofts June 20, 2019.  At that time, he was getting to stroke with his CPAP but seen for the first 10 minutes before he was able to settle down and fall asleep.  He denies chest pain or shortness of breath.  He had noted some elevated blood pressures as well as some dyspnea on exertion.  During that evaluation his diltiazem was discontinued and he was started on amlodipine 5 mg.  He was told to continue his present dose of olmesartan, metoprolol, spironolactone.  During that evaluation he was maintaining sinus rhythm but had bradycardia and first-degree heart block.  Flecainide was reduced down to 50 mg twice a day since his symptoms of fatigability and dyspnea seem to  occur at the higher dose.  With return to a lower dose his symptoms resolved.  I evaluated him in June 2021 over the prior 3 months he denied any recurrent episodes of chest pain.    He continues to have issues in the first 10 minutes of his CPAP use which I suspect may be related to not having a ramp time on his machine which can easily be adjusted.  His blood pressure does continue to be somewhat labile but he states at home most of the time it is controlled.  He was unaware of any recurrent episodes of atrial fibrillation.  He denied presyncope or syncope.    He underwent a nuclear stress test which apparently had been ordered for dyspnea on exertion.  This was low risk study and demonstrated a hypertensive response to exercise.  A defect was felt to be present in the inferior wall which was interpreted as scar with mild peri-infarct ischemia, which may also have been exacerbated by his body habitus and diaphragmatic attenuation.  Currently he remains asymptomatic and denies chest pain or shortness of breath.  He has been evaluated by Dr. Hermina Barters for left cochlear Mnire's disease.  He underwent audiogram which showed mild to moderate severity high-frequency sensorineural neural hearing loss in the right ear and mild to moderately severe downsloping sensorineural neural hearing loss in the left ear.   I saw him in October 2021 at which time he denied any recurrent episodes of atrial fibrillation.  He  continued to be on amlodipine 5 mg, metoprolol XL 100 mg in the evening with 25 or 50 mg in the morning, olmesartan 40 mg, and spironolactone 12.5 mg daily.  He was on Eliquis for anticoagulation.  He wastolerating Livalo 2 mg and Zetia for hyperlipidemia with LDL cholesterol at 55 in June 2021.  He tries to play golf several times a week if possible and continues to score well.  He continues to be followed by Dr. Dennard Nip with plans for continued weight loss.  I saw him in February 2022 and  since his prior evaluation he was having issues with his knees.   He had undergone removal of his meniscus.  He has been followed by Charlann Boxer he recently had a hyaluronic acid injection with benefit.  He also has undergone evaluation with Dr. Ihor Gully with discomfort in his right hip.  He has low back issues at L5-S1 and is now on gabapentin.  He is unaware of recurrent atrial fibrillation.  Apparently he is was only taking flecainide 50 mg daily instead of twice a day.  He continued to be on amlodipine 7.5 mg, Eliquis 5 mg twice a day, metoprolol succinate 150 mg in the evening and spironolactone 12.5 mg daily.  At times he he has some mild blood pressure elevation.  He continued to be evaluated at healthy weight and wellness.  Because of his knee and hip issues he has not been playing as much golf as he had in the past.  He continuesd to use CPAP with 100% compliance. A download was obtained from January 20 through March 07, 2020.  He is averaging 7 hours and 2 minutes of CPAP use per night.  AHI is 1.5 with a 95th percentile pressure at 11.5 and maximum average pressure at 13.  During that evaluation, his QTc interval was prolonged at 497 and rather than further increase flecainide I recommended titration of metoprolol to 50 mg in the morning and 100 mg at night.  He continued to be on Livalo 2 mg daily and Zetia for hyperlipidemia.  I saw him on July 17, 2020.  Since his prior evaluation he developed an episode of recurrent atrial fibrillation and was seen by Jory Sims, NP on June 27, 2020 and was scheduled to see Dr. Curt Bears on June 30, 2020.  During that evaluation, his flecainide was discontinued and amiodarone 400 mg twice a day was initiated for 2 weeks with plans to reduce to 400 mg for 2 weeks and then 200 mg daily.  He was scheduled to undergo DC cardioversion on July 14 2020 and upon arrival for the procedure he was found to have converted to sinus rhythm and cardioversion was not performed.   When seen by Dr. Curt Bears, the patient had already a scheduled right hip replacement surgery to be done by Dr. [name] on August 12, 2020.  Dr. Curt Bears has scheduled Mr. Geffre to undergo cardioversion in late August 2022.  He was using CPAP with 100% compliance with AHI of 1.7 and 95th percentile pressure 10.7 with maximum average pressure 12.7 centimeters of water.  He underwent CT cardiac morphology/pulmonary vein prior to his ablation.  Calcium score was 42.7 Aggrastat units which is the 25th percentile for age, race and sex matched control.  He had an upper normal ascending aorta size at 3.9 cm.  He underwent successful atrial fibrillation ablation with Dr. Curt Bears on September 02, 2020.  He saw him in follow-up on December 16, 2020 and was maintaining  sinus rhythm and was taken off flecainide.  I last saw him on January 22, 2021 at which time he was maintaining sinus rhythm and continued to be on  Eliquis 5 mg twice a day.  He has been on amlodipine 7. 5 mg daily, metoprolol succinate 50 mg twice a day, olmesartan 40 mg daily in addition to spironolactone 12.5 mg daily for hypertension.  He is on Livalo 2 mg with Zetia 10 mg for hyperlipidemia.  He continues to see Dr. Leafy Ro at healthy weight and wellness program.  He has resumed his activity and is walking 2 miles in the morning and riding a stationary bike for 30 minutes later in the afternoon.    Since I last saw him, he is to require underwent an echo Doppler study on July 24, 2021 which showed EF 50 to 55% (54.2% on biplane).  He had moderate left atrial dilatation.  There was mild dilation of his aortic root at 43 mm and aortic arch at 45 mm.  He has been seen by Dr. Curt Bears and more recently by Barrington Ellison, PA-C.  He was noted to have very brief SVT on a long-term monitor.  He has continued to use CPAP and a download from September 7 through October 23, 2021 showed average use at 7 hours and 37 minutes per night.  AHI was excellent at 0.9 with his  pressure range 6 to 8 to 20 cm of water.  He had never had a colonoscopy recently had a Cologuard test recommended by his primary physician which was positive.  He subsequently underwent colonoscopy by Dr. Rachael Darby and small mass was detected in the colon with biopsy positive for malignancy.  He is undergoing further evaluation with CT imaging with plans to for surgical consultation.  He presents for evaluation   Past Medical History:  Diagnosis Date   Arthritis    hips    Atrial fibrillation (HCC)    Cataract    Cochlear Meniere syndrome of left ear 06/09/2015   CPAP (continuous positive airway pressure) dependence    Dysrhythmia    PAF- hx of 7 years ago    HLD (hyperlipidemia)    Hypertension 05/29/2010   ECHO-EF 67% wnl   Meniere disease    Obesity    Palpitations    S/P right total hip arthroplasty 08/12/2020   Sleep apnea 09/14/2004   [facility]- Dr. Humphrey Rolls; CPAP titration 11/12/04- Dr. Humphrey Rolls    Past Surgical History:  Procedure Laterality Date   ATRIAL FIBRILLATION ABLATION N/A 09/10/2020   Procedure: ATRIAL FIBRILLATION ABLATION;  Surgeon: Constance Haw, MD;  Location: [facility];  Service: Cardiovascular;  Laterality: N/A;   CARDIAC CATHETERIZATION  11/03/2005   CARDIOVERSION N/A 09/26/2018   Procedure: CARDIOVERSION;  Surgeon: Geralynn Rile, MD;  Location: [facility];  Service: Endoscopy;  Laterality: N/A;   CARDIOVERSION N/A 03/13/2019   Procedure: CARDIOVERSION;  Surgeon: Skeet Latch, MD;  Location: [facility];  Service: Cardiovascular;  Laterality: N/A;   CARDIOVERSION N/A 03/03/2021   Procedure: CARDIOVERSION;  Surgeon: Josue Hector, MD;  Location: [facility];  Service: Cardiovascular;  Laterality: N/A;   CATARACT EXTRACTION  01/19/2012   KNEE ARTHROSCOPY     left knee- 2002    TOTAL HIP ARTHROPLASTY  06/08/2011   Procedure: TOTAL HIP ARTHROPLASTY ANTERIOR APPROACH;  Surgeon: [name], MD;  Location: [facility];   Service: Orthopedics;  Laterality: Left;   TOTAL HIP ARTHROPLASTY Right 08/12/2020   ANTERIOR  APPROACH   TOTAL HIP ARTHROPLASTY Right 08/12/2020   Procedure: TOTAL HIP ARTHROPLASTY ANTERIOR APPROACH;  Surgeon: Paralee Cancel, MD;  Location: [facility];  Service: Orthopedics;  Laterality: Right;    No Known Allergies   Current Outpatient Medications  Medication Sig Dispense Refill   Coenzyme Q10 200 MG capsule Take 200 mg by mouth in the morning.     ELIQUIS 5 MG TABS tablet TAKE 1 TABLET BY MOUTH TWICE A DAY 180 tablet 1   ezetimibe (ZETIA) 10 MG tablet Take 1 tablet (10 mg total) by mouth in the morning. 90 tablet 1   metFORMIN (GLUCOPHAGE) 500 MG tablet Take 1 tablet (500 mg total) by mouth daily with breakfast. 90 tablet 1   metoprolol succinate (TOPROL-XL) 100 MG 24 hr tablet Take 1 tablet (100 mg total) by mouth daily. (Patient taking differently: Take 100 mg by mouth daily. 1/2 tablet in the AM and 1/2 tablet in the PM) 90 tablet 1   Multiple Vitamins-Minerals (MULTIVITAMIN WITH MINERALS) tablet Take 1 tablet by mouth daily.     olmesartan (BENICAR) 40 MG tablet TAKE 1 TABLET BY MOUTH EVERY DAY 90 tablet 3   Pitavastatin Calcium (LIVALO) 2 MG TABS Take 1 tablet (2 mg total) by mouth every evening. 90 tablet 1   spironolactone (ALDACTONE) 25 MG tablet Take 0.5 tablets (12.5 mg total) by mouth daily. 45 tablet 1   tamsulosin (FLOMAX) 0.4 MG CAPS capsule Take 0.4 mg by mouth in the morning.     triamcinolone cream (KENALOG) 0.5 % APPLY 1 APPLICATION TOPICALLY 2 (TWO) TIMES DAILY. TO AFFECTED AREAS. 30 g 3   amLODipine (NORVASC) 5 MG tablet Take 1.5 tablets (7.5 mg total) by mouth daily. 135 tablet 2   amoxicillin (AMOXIL) 500 MG capsule SMARTSIG:4 Capsule(s) By Mouth (Patient not taking: Reported on 10/26/2021)     No current facility-administered medications for this visit.    Socially he is remarried after his first wife passed away. He has 2 children. He is retired  and was the Physiological scientist at [employer]. there is no tobacco history.  He plays golf at least 2 days/week and this past year not only did he have a hole in 1 but he has also shot his age.  ROS General: Negative; No fevers, chills, or night sweats;  HEENT: Negative; No changes in vision or hearing, sinus congestion, difficulty swallowing Pulmonary: Negative; No cough, wheezing, shortness of breath, hemoptysis Cardiovascular: See HPI GI: Recent colonoscopy with GU: Negative; No dysuria, hematuria, or difficulty voiding Musculoskeletal: He is status post rib fracture.  He is status post hip replacement. Hematologic/Oncology: Negative; no easy bruising, bleeding Endocrine: Negative; no heat/cold intolerance; no diabetes Neuro: No vertigo or imbalance issues Skin: Negative; No rashes or skin lesions Psychiatric: Negative; No behavioral problems, depression Sleep: Positive for obstructive sleep apnea on CPAP therapy with 100% compliance No snoring, daytime sleepiness, hypersomnolence, bruxism, restless legs, hypnogognic hallucinations, no cataplexy Other comprehensive 14 point system review is negative.  PE BP (!) 136/94 (BP Location: Left Arm, Patient Position: Sitting)   Pulse 77   Wt 256 lb 3.2 oz (116.2 kg)   SpO2 99%   BMI 32.02 kg/m    Repeat blood pressure by me was 156/90.  Wt Readings from Last 3 Encounters:  10/26/21 256 lb 3.2 oz (116.2 kg)  10/26/21 260 lb (117.9 kg)  10/21/21 260 lb (117.9 kg)  In 2021 he had lost approximately 40 pounds   General: Alert, oriented, no distress.  Skin: normal turgor, no rashes, warm and dry HEENT: Normocephalic, atraumatic. Pupils equal round and reactive to light; sclera anicteric; extraocular muscles intact; Nose without nasal septal hypertrophy Mouth/Parynx benign; Mallinpatti scale Neck: No JVD, no carotid bruits; normal carotid upstroke Lungs: clear to ausculatation and percussion; no wheezing or rales Chest wall: without tenderness to  palpitation Heart: PMI not displaced, RRR, s1 s2 normal, 1/6 systolic murmur, no diastolic murmur, no rubs, gallops, thrills, or heaves Abdomen: soft, nontender; no hepatosplenomehaly, BS+; abdominal aorta nontender and not dilated by palpation. Back: no CVA tenderness Pulses 2+ Musculoskeletal: full range of motion, normal strength, no joint deformities Extremities: no clubbing cyanosis or edema, Homan's sign negative  Neurologic: grossly nonfocal; Cranial nerves grossly wnl Psychologic: Normal mood and affect    October 26, 2021 ECG (independently read by me): NSR at 77; RBBB, LAHB, LVH; QTc 484 msec   January 22, 2021 ECG (independently read by me):  NSR at 76, RBBB: PR 200 msec  July 17, 2020 ECG (independently read by me): NSR at 70; RBBB, 1st degree AV block; PR 220 msec; QTc 492 msec  March 10, 2020 ECG (independently read by me): NSR at 82; RBBB with repoarization, QTc 497 msec  November 06, 2019 ECG (independently read by me): Sinus rhythm at 64; 1st degree AV block; PR 212 msec;RBBB  July 09, 2019 ECG (independently read by me): Normal sinus rhythm at 70 bpm, first-degree AV block with a.  PR interval at 212 ms.  Right bundle branch block with repolarization changes.  Borderline LVH by voltage criteria.  QTc interval 470 ms.  April 04, 2019 ECG (independently read by me): Sinus bradycardia 59 bpm, first-degree AV block with a PR of 248 ms.  LVH.  Inferior Q waves, unchanged  January 24, 2019 ECG (independently read by me): Atrial fibrillation at 83 bpm.  Borderline LVH by voltage criteria in aVL; QTc interval 446 ms.  January 09, 2019 ECG (independently read by me): Atrial fibrillation ar 97, RBBB; Left axis deviation; Inferior Q waves, unchanged. QTc 487    August 09, 2018 ECG (independently read by me): Normal sinus rhythm at 72 bpm.  Left Axis deviation.  QTc interval 429 ms.  September 15, 2018 ECG (independently read by me): Atrial fibrillation at 92 bpm.  Right bundle  branch block with repolarization changes.  August 23, 2018 ECG (independently read by me): Atrial fibrillation at 96 bpm.  LVH with repolarization changes.  Left axis deviation.  Left anterior hemiblock.  Inferior Q waves  August 09, 2018 ECG (independently read by me):Atrial fibrillatoion at 44; LVH with repolarization  October 219 ECG (independently read by me): Normal sinus rhythm at 81 bpm.  Nonspecific intraventricular conduction delay.  Left axis deviation  September 2018 ECG (independently read by me): Normal sinus rhythm at 63 bpm.  Left bundle branch block with repolarization changes.  Normal intervals.  No ectopy.  April 2018 ECG (independently read by me): Normal sinus rhythm at 68 bpm.  LVH with repolarization.  Normal intervals.  November 2017 ECG (independently read by me): Normal sinus rhythm at 78 bpm.  Nonspecific interventricular delay.  QRS complex V1 V2.  August 2016 ECG (independently read by me): Normal sinus rhythm at 77 bpm.  LVH with repolarization changes.  Left axis deviation.  December 2015 ECG (independently read by me): Normal sinus rhythm at 87 bpm.  LVH by voltage criteria with T-wave changes in leads 1 and aVL.  July 2014 ECG: Normal sinus rhythm at  76 beats per minute. LVH with QRS widening.  LABS:     Latest Ref Rng & Units 10/26/2021    9:53 AM 08/05/2021    8:19 AM 07/08/2021   11:00 AM  BMP  Glucose 70 - 99 mg/dL 97  104  104   BUN 6 - 23 mg/dL _0 Creatinine 0.40 - 1.50 mg/dL 1.35  1.32  1.37   BUN/Creat Ratio 10 - 24   22   Sodium 135 - 145 mEq/L 138  139  138   Potassium 3.5 - 5.1 mEq/L 4.5  4.7  4.5   Chloride 96 - 112 mEq/L 107  108  104   CO2 19 - 32 mEq/L _1 Calcium 8.4 - 10.5 mg/dL 10.5  9.7    9.5  10.4       Latest Ref Rng & Units 10/26/2021    9:53 AM 08/05/2021    8:19 AM 07/08/2021   11:00 AM  Hepatic Function  Total Protein 6.0 - 8.3 g/dL 7.3  6.3  7.0   Albumin 3.5 - 5.2 g/dL 4.4  4.3  4.6   AST 0 - 37 U/L  _2 ALT 0 - 53 U/L _3 Alk Phosphatase 39 - 117 U/L 63  55  80   Total Bilirubin 0.2 - 1.2 mg/dL 0.7  0.9  0.8       Latest Ref Rng & Units 10/26/2021    9:53 AM 07/08/2021   11:00 AM 02/13/2021    3:37 PM  CBC  WBC 4.0 - 10.5 K/uL 4.6  7.3  7.3   Hemoglobin 13.0 - 17.0 g/dL 10.9  13.8  15.3   Hematocrit 39.0 - 52.0 % 33.2  42.0  46.2   Platelets 150.0 - 400.0 K/uL 215.0  198  189    Lab Results  Component Value Date   MCV 79.1 10/26/2021   MCV 88 07/08/2021   MCV 92 02/13/2021   Lab Results  Component Value Date   TSH 1.010 07/08/2021    Lab Results  Component Value Date   HGBA1C 6.0 08/05/2021    Lipid Panel     Component Value Date/Time   CHOL 113 08/05/2021 0819   CHOL 120 08/01/2020 1031   TRIG 50.0 08/05/2021 0819   HDL 48.40 08/05/2021 0819   HDL 54 08/01/2020 1031   CHOLHDL 2 08/05/2021 0819   VLDL 10.0 08/05/2021 0819   LDLCALC 55 08/05/2021 0819   LDLCALC 50 08/01/2020 1031   LDLCALC 119 (H) 10/12/2016 0902     RADIOLOGY: No results found.  IMPRESSION:  1. Paroxysmal atrial fibrillation (HCC)   2. Primary hypertension   3. Anticoagulated   4. OSA on CPAP   5. Colonic mass   6. Mild obesity   7. Pre-diabetes     ASSESSMENT AND PLAN: Mr Statzer is a 76 year old gentleman who has a long-standing history of hypertension and remotely had been on amlodipine/benazepril 10/40, HCTZ 25 mg, Toprol-XL 100 mg in addition to spironolactone 12.5 mg daily.  He had had a prior isolated episode of atrial fibrillation in 2007 and had undergone cardioversion.  On this regimen his blood pressure had stabilized and consistently was well controlled and he had not had any recurrent episodes of atrial fibrillation.   He had gained significant amount of weight since August 2019.  He initiated a major  lifestyle change and has been working closely with Dr. Leafy Ro from [facility] resulting in a greater than 40 pound weight loss in  2021.   When I saw him in late July 2020 he was in atrial fibrillation of questionable duration.  At the time retrospectively he believes he may have been in A. fib for 3 to 4 months duration.  I gradually titrated his medical regimen and changed his benazepril to diltiazem and olmesartan to take in addition to metoprolol succinate and his spironolactone 12.5 mg daily.  He had continued to use CPAP with 100% compliance.  His only previous episode of A. fib prior to this episode had occurred in 2007 and he had maintained sinus rhythm until his recurrent episode.  He underwent successful cardioversion on September 26, 2018 with restoration of sinus rhythm.  When I saw him on January 09, 2019 he was continuing to have periods of increased stress ECG again showed recurrent atrial fibrillation of questionable duration.  Retrospectively he felt he may have been in atrial fibrillation for 3 to 4 weeks duration prior to that visit.  He also had an episode of disorientation with possible transient expressive aphasia and underwent neurologic evaluation with Dr. Leonie Man and also EP evaluation with Dr. Curt Bears.  He was felt that his word finding difficulty and anomia most likely represented mild cognitive impairment rather than a TIA or stroke.  He had been on increased dose of flecainide up to 100 mg twice a day after his subsequent cardioversion.  His last echo Doppler study in January 2021 showed an EF of 50 to 55% without regional wall motion abnormalities.  When I last saw him in February 2022 he was asymptomatic without chest pain or shortness of breath and was maintaining sinus rhythm.  He has subsequently reverted back to atrial fibrillation and his flecainide dose was discontinued and he was started on amiodarone initially 400 mg twice a day when seen by Dr. Curt Bears on June 30, 2020.  He had pharmacologically converted to sinus rhythm when he presented for outpatient DC cardioversion on February 14, 2020.  Since my last  evaluation in June 2022, he underwent right successful hip replacement surgery by Dr. [name] and also successful atrial fibrillation ablation by Dr. Curt Bears.  He has been maintaining sinus rhythm and is now off flecainide and amiodarone.  He was recently found to have colonic mass noted on his recent colonoscopy.  He had never had a prior colonoscopy.  He will be undergoing CT imaging and surgical evaluation.  Today his blood pressure is elevated and he has only been taking amlodipine 5 mg where previously he had been on 7.5 mg.  He continues to be on spironolactone 12.5 mg daily olmesartan 40 mg in addition to metoprolol succinate 100 mg.  With his blood pressure elevation I am again recommending that he increase amlodipine back to 7.5 mg.  He continues to use CPAP for his obstructive sleep apnea and his most recent download from September 7 through October 23, 2021 continues to show excellent compliance.  Average use is 7 hours and 37 minutes per night.  AHI is excellent at 0.9 with his 95th percentile pressure 10.4 maximum average pressure at 11.7.  He will be following up with Dr. Loletha Carrow regarding his GI findings.  He also sees Dr. Thornell Mule with history of questionable Mnire's disease but he now is felt to have "heavy headedness."  He remains active and plays golf regularly without chest pain only  rare shortness of breath.  I will see him in 4 to 6 weeks for further evaluation.    [name], MD, [facility]  11/01/2021 11:54 AM

## 2021-10-26 NOTE — Patient Instructions (Signed)
Medication Instructions:  INCREASE amlodipine to 7.5 mg daily  *If you need a refill on your cardiac medications before your next appointment, please call your pharmacy*  Follow-Up: At [facility], you and your health needs are our priority.  As part of our continuing mission to provide you with exceptional heart care, we have created designated Provider Care Teams.  These Care Teams include your primary Cardiologist (physician) and Advanced Practice Providers (APPs -  Physician Assistants and Nurse Practitioners) who all work together to provide you with the care you need, when you need it.  We recommend signing up for the patient portal called "MyChart".  Sign up information is provided on this After Visit Summary.  MyChart is used to connect with patients for Virtual Visits (Telemedicine).  Patients are able to view lab/test results, encounter notes, upcoming appointments, etc.  Non-urgent messages can be sent to your provider as well.   To learn more about what you can do with MyChart, go to [email or web address].    Your next appointment:   4 week(s)  The format for your next appointment:   In Person  Provider:   Shelva Majestic, MD

## 2021-10-27 ENCOUNTER — Encounter: Payer: Self-pay | Admitting: Family Medicine

## 2021-10-27 LAB — CEA: CEA: <2 ng/mL

## 2021-10-28 ENCOUNTER — Ambulatory Visit (HOSPITAL_COMMUNITY)
Admission: RE | Admit: 2021-10-28 | Discharge: 2021-10-28 | Disposition: A | Discharge status: Home / Self Care | Payer: Medicare PPO | Source: Ambulatory Visit | Attending: Gastroenterology | Admitting: Gastroenterology

## 2021-10-28 DIAGNOSIS — K5730 Diverticulosis of large intestine without perforation or abscess without bleeding: Secondary | ICD-10-CM | POA: Diagnosis not present

## 2021-10-28 DIAGNOSIS — C182 Malignant neoplasm of ascending colon: Secondary | ICD-10-CM | POA: Diagnosis not present

## 2021-10-28 DIAGNOSIS — K6389 Other specified diseases of intestine: Secondary | ICD-10-CM | POA: Diagnosis not present

## 2021-10-28 DIAGNOSIS — E041 Nontoxic single thyroid nodule: Secondary | ICD-10-CM | POA: Diagnosis not present

## 2021-10-28 DIAGNOSIS — R195 Other fecal abnormalities: Secondary | ICD-10-CM | POA: Insufficient documentation

## 2021-10-28 MED ORDER — IOHEXOL 300 MG/ML  SOLN
100.0000 mL | Freq: Once | INTRAMUSCULAR | Status: AC | PRN
  Administered 2021-10-28: 100 mL via INTRAVENOUS

## 2021-10-29 ENCOUNTER — Ambulatory Visit (INDEPENDENT_AMBULATORY_CARE_PROVIDER_SITE_OTHER): Payer: Medicare PPO | Admitting: Psychologist

## 2021-10-29 ENCOUNTER — Encounter: Payer: Self-pay | Admitting: Gastroenterology

## 2021-10-29 DIAGNOSIS — F411 Generalized anxiety disorder: Secondary | ICD-10-CM | POA: Diagnosis not present

## 2021-10-29 NOTE — Progress Notes (Signed)
[facility] Counselor/Therapist Progress Note  Patient ID: Clayton [name], MRN: [id number],    Date: 10/29/2021  Time Spent: 08:05 am to 08:33 am; total time: 28 minutes  This session was held via video webex teletherapy due to the coronavirus risk at this time. The patient consented to video teletherapy and was located at his home during this session. He is aware it is the responsibility of the patient to secure confidentiality on his end of the session. The provider was in a private home office for the duration of this session. Limits of confidentiality were discussed with the patient.   Treatment Type: Individual Therapy  Reported Symptoms: Some stress related to new colon cancer diagnosis  Mental Status Exam: Appearance:  Well Groomed     Behavior: Appropriate  Motor: Normal  Speech/Language:  Normal Rate  Affect: Appropriate  Mood: normal  Thought process: normal  Thought content:   WNL  Sensory/Perceptual disturbances:   WNL  Orientation: oriented to person  Attention: Good  Concentration: Good  Memory: WNL  Fund of knowledge:  Good  Insight:   Fair  Judgment:  Good  Impulse Control: Good   Risk Assessment: Danger to Self:  No Self-injurious Behavior: No Danger to Others: No Duty to Warn:no Physical Aggression / Violence:No  Access to Firearms a concern: No  Gang Involvement:No   Subjective:  The patient described himself as okay considering the new diagnosis of colon cancer. He reflected on events since the last session. He reflected on how the medical team has been responsive to his needs. He meets with the surgeon next week to discuss next steps. He voiced that coping strategies have assisted him. He processed thoughts and emotions. He asked to follow up. He denied suicidal and homicidal ideation.   Interventions: Worked on developing a therapeutic relationship with the patient using active listening and reflective statements. Provided emotional  support using empathy and validation. Reviewed events since the last session. Normalized and validated expressed thoughts and emotions. Explored the next steps patient is taking regarding the new cancer diagnosis. Used socratic questions to assist the patient. Praised patient for being able to implement coping strategies previously discussed. Used a Product/process development scientist to assist the patient. Discussed next steps for the patient. Provided empathic statements. Assessed for suicidal and homicidal ideation  Homework: Meet with Dr. Dema Severin to discuss surgery options regarding colon cancer  Next Session: Emotional support and review medical appointment with Dr. Dema Severin  Diagnosis: F41.1 generalized anxiety disorder.   Plan:    Goals Reduce overall frequency, intensity, and duration of anxiety Stabilize anxiety level wile increasing ability to function Enhance ability to effectively cope with full variety of stressors Learn and implement coping skills that result in a reduction of anxiety   Objectives target date for all objectives is 10/14/2022 Verbalize an understanding of the cognitive, physiological, and behavioral components of anxiety Learning and implement calming skills to reduce overall anxiety Verbalize an understanding of the role that cognitive biases play in excessive irrational worry and persistent anxiety symptoms Identify, challenge, and replace based fearful talk Learn and implement problem solving strategies Identify and engage in pleasant activities Learning and implement personal and interpersonal skills to reduce anxiety and improve interpersonal relationships Learn to accept limitations in life and commit to tolerating, rather than avoiding, unpleasant emotions while accomplishing meaningful goals Identify major life conflicts from the past and present that form the basis for present anxiety Maintain involvement in work, family, and social activities Reestablish a consistent sleep-wake  cycle Cooperate with a medical evaluation  Interventions Engage the patient in behavioral activation Use instruction, modeling, and role-playing to build the client's general social, communication, and/or conflict resolution skills Use Acceptance and Commitment Therapy to help client accept uncomfortable realities in order to accomplish value-consistent goals Reinforce the client's insight into the role of his/her past emotional pain and present anxiety  Support the client in following through with work, family, and social activities Teach and implement sleep hygiene practices  Refer the patient to a physician for a psychotropic medication consultation Monito the clint's psychotropic medication compliance Discuss how anxiety typically involves excessive worry, various bodily expressions of tension, and avoidance of what is threatening that interact to maintain the problem  Teach the patient relaxation skills Assign the patient homework Discuss examples demonstrating that unrealistic worry overestimates the probability of threats and underestimates patient's ability  Assist the patient in analyzing his or her worries Help patient understand that avoidance is reinforcing   The patient reviewed the treatment plan on 10/08/2020. The patient reevaluated the treatment plan on 10/13/2021 and approved of it.   Conception Chancy, PsyD

## 2021-11-01 ENCOUNTER — Encounter (INDEPENDENT_AMBULATORY_CARE_PROVIDER_SITE_OTHER): Payer: Self-pay | Admitting: Family Medicine

## 2021-11-01 ENCOUNTER — Encounter: Payer: Self-pay | Admitting: Cardiovascular Disease

## 2021-11-05 ENCOUNTER — Ambulatory Visit (INDEPENDENT_AMBULATORY_CARE_PROVIDER_SITE_OTHER): Payer: Medicare PPO | Admitting: Family Medicine

## 2021-11-05 ENCOUNTER — Encounter (INDEPENDENT_AMBULATORY_CARE_PROVIDER_SITE_OTHER): Payer: Self-pay | Admitting: Family Medicine

## 2021-11-05 VITALS — BP 149/74 | HR 71 | Temp 98.3°F | Ht 75.0 in | Wt 253.0 lb

## 2021-11-05 DIAGNOSIS — Z6831 Body mass index (BMI) 31.0-31.9, adult: Secondary | ICD-10-CM

## 2021-11-05 DIAGNOSIS — K6389 Other specified diseases of intestine: Secondary | ICD-10-CM | POA: Insufficient documentation

## 2021-11-05 DIAGNOSIS — I10 Essential (primary) hypertension: Secondary | ICD-10-CM | POA: Diagnosis not present

## 2021-11-05 DIAGNOSIS — C182 Malignant neoplasm of ascending colon: Secondary | ICD-10-CM | POA: Diagnosis not present

## 2021-11-05 DIAGNOSIS — E669 Obesity, unspecified: Secondary | ICD-10-CM

## 2021-11-05 DIAGNOSIS — F439 Reaction to severe stress, unspecified: Secondary | ICD-10-CM | POA: Diagnosis not present

## 2021-11-05 NOTE — Progress Notes (Signed)
Office: [phone]  /  Fax: [phone]   Total lbs lost to date: 13 Total lbs lost since last in-office visit: 2     BP (!) 149/74   Pulse 71   Temp 98.3 F (36.8 C)   Ht '6\' 3"'$  (1.905 m)   Wt 253 lb (114.8 kg)   SpO2 98%   BMI 31.62 kg/m  He was weighed on the bioimpedance scale:  Body mass index is 31.62 kg/m.  General:  Alert, oriented and cooperative. Patient is in no acute distress.  Mental Status: Normal mood and affect. Normal behavior. Normal judgment and thought content.        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 Humphrey Rolls; CPAP titration 11/12/04- Dr. Humphrey Rolls    History of Present Illness The patient presents with a history of obesity, an ascending colonic mass, and hypertension. They recently underwent a colonoscopy and CT scan, which showed some thickening in the colon but no significant metastasis. The patient is scheduled to see a surgeon to discuss the possibility of resectioning the mass. They have been experiencing stress related to their medical condition but have been practicing meditation and mindfulness techniques to help manage it.  The patient has been working on losing weight, which has been successful to some extent, as they have lost 2 pounds. They are aware that losing weight can decrease the risk of colon cancer and have been making efforts to improve their overall health. They have not discussed fiber intake in detail but are open to increasing their fiber consumption to  further decrease their risk. The patient is considering incorporating higher fiber foods, such as beans, into their diet.  In addition to weight loss, the patient has been focusing on improving their overall health by managing their hypertension and cholesterol levels. They have been successful in keeping these conditions under control. The patient has also been receiving vaccinations as recommended by their primary care physician.  The patient has a history of atrial fibrillation, which is currently under control. They have been working closely with their healthcare team to manage their various health conditions and have been keeping their primary care physician informed of their progress through electronic communication.  Overall, the patient is actively engaged in managing their health and is seeking appropriate medical care for their colonic mass and other chronic conditions. They are committed to making lifestyle changes to improve their overall health and decrease their risk of further complications.  Assessment & Plan Colon thickening: Identified on recent CT scan, patient is scheduled to see a surgeon for further evaluation. -Continue with planned surgical consultation. -Encourage gradual increase in dietary fiber intake to avoid gastrointestinal discomfort. -encourage antioxidant rich foods  Stress management: Patient reports high stress levels but has found mindfulness and meditation helpful. -Encourage continued use of these techniques.  Weight loss: Patient has lost 2 pounds, which is beneficial for overall health and specifically  for reducing colon cancer risk. -Encourage continued efforts towards weight loss and healthy diet. -continue low carb plan + antioxidant and fiber rich foods  Blood pressure management: Patient reports lightheadedness with lower blood pressure readings. -Continue monitoring blood pressure at home and adjust treatment as necessary to maintain at a level  that is safe but also comfortable for the patient.  Follow-up in 1 month.  I have personally spent 40 minutes total time today in preparation, patient care, and documentation for this visit, including the following: review of clinical lab tests; review of medical tests/procedures/services.    Dennard Nip, MD

## 2021-11-09 ENCOUNTER — Ambulatory Visit (INDEPENDENT_AMBULATORY_CARE_PROVIDER_SITE_OTHER): Payer: Medicare PPO | Admitting: Psychologist

## 2021-11-09 DIAGNOSIS — F411 Generalized anxiety disorder: Secondary | ICD-10-CM

## 2021-11-09 NOTE — Progress Notes (Signed)
[facility] Counselor/Therapist Progress Note  Patient ID: Clayton [name], MRN: [id number],    Date: 11/09/2021  Time Spent: 12:01 pm to 12:41 pm; total time: 40 minutes  This session was held via video webex teletherapy due to the coronavirus risk at this time. The patient consented to video teletherapy and was located at his home during this session. He is aware it is the responsibility of the patient to secure confidentiality on his end of the session. The provider was in a private home office for the duration of this session. Limits of confidentiality were discussed with the patient.   Treatment Type: Individual Therapy  Reported Symptoms: Less distress related to cancer diagnosis  Mental Status Exam: Appearance:  Well Groomed     Behavior: Appropriate  Motor: Normal  Speech/Language:  Normal Rate  Affect: Appropriate  Mood: normal  Thought process: normal  Thought content:   WNL  Sensory/Perceptual disturbances:   WNL  Orientation: oriented to person  Attention: Good  Concentration: Good  Memory: WNL  Fund of knowledge:  Good  Insight:   Fair  Judgment:  Good  Impulse Control: Good   Risk Assessment: Danger to Self:  No Self-injurious Behavior: No Danger to Others: No Duty to Warn:no Physical Aggression / Violence:No  Access to Firearms a concern: No  Gang Involvement:No   Subjective:  The patient described himself as doing well while talking about all of his medical appointment since his last session with this clinician. He voiced the next step is to wait to hear from the surgeon's office to schedule the surgery to remove the tumor. Continuing to talk, patient described self as having a knot in his stomach. He was open to considering exercises to see if they would assist in decreasing that "knot" sensation. He then processed thoughts and feelings related to the cancer. He also talked about how his support system is doing. He asked to follow up. He  denied suicidal and homicidal ideation.   Interventions: Worked on developing a therapeutic relationship with the patient using active listening and reflective statements. Provided emotional support using empathy and validation. Reviewed events since the last session. Normalized and validated expressed thoughts and emotions. Praised the patient for being able to get in and see the oncology team as quickly as possible. Identified goals for the session. Explored the etiology of the "knot" sensation. Provided psychoeducation about progressive muscle relaxation. Facilitated problem solving to practice PMR at night time. Explored the idea of incorporating mindfulness, defusion, and calm before going to bed as well as using in the morning. Processed thoughts and emotions. Used socratic questions to assist the patient gain insight.  Assisted in problem solving. Processed any thoughts about surgery and Thanksgiving. Briefly processed support system and how they are coping with the new diagnosis. Discussed next steps for the patient. Provided empathic statements. Assessed for suicidal and homicidal ideation  Homework: Implement mindfulness, calm, and PMR at nighttime  Next Session: Review homework and emotional support.   Diagnosis: F41.1 generalized anxiety disorder.   Plan:    Goals Reduce overall frequency, intensity, and duration of anxiety Stabilize anxiety level wile increasing ability to function Enhance ability to effectively cope with full variety of stressors Learn and implement coping skills that result in a reduction of anxiety   Objectives target date for all objectives is 10/14/2022 Verbalize an understanding of the cognitive, physiological, and behavioral components of anxiety Learning and implement calming skills to reduce overall anxiety Verbalize an understanding of the  role that cognitive biases play in excessive irrational worry and persistent anxiety symptoms Identify, challenge,  and replace based fearful talk Learn and implement problem solving strategies Identify and engage in pleasant activities Learning and implement personal and interpersonal skills to reduce anxiety and improve interpersonal relationships Learn to accept limitations in life and commit to tolerating, rather than avoiding, unpleasant emotions while accomplishing meaningful goals Identify major life conflicts from the past and present that form the basis for present anxiety Maintain involvement in work, family, and social activities Reestablish a consistent sleep-wake cycle Cooperate with a medical evaluation  Interventions Engage the patient in behavioral activation Use instruction, modeling, and role-playing to build the client's general social, communication, and/or conflict resolution skills Use Acceptance and Commitment Therapy to help client accept uncomfortable realities in order to accomplish value-consistent goals Reinforce the client's insight into the role of his/her past emotional pain and present anxiety  Support the client in following through with work, family, and social activities Teach and implement sleep hygiene practices  Refer the patient to a physician for a psychotropic medication consultation Monito the clint's psychotropic medication compliance Discuss how anxiety typically involves excessive worry, various bodily expressions of tension, and avoidance of what is threatening that interact to maintain the problem  Teach the patient relaxation skills Assign the patient homework Discuss examples demonstrating that unrealistic worry overestimates the probability of threats and underestimates patient's ability  Assist the patient in analyzing his or her worries Help patient understand that avoidance is reinforcing   The patient reviewed the treatment plan on 10/08/2020. The patient reevaluated the treatment plan on 10/13/2021 and approved of it.   Conception Chancy, PsyD

## 2021-11-12 ENCOUNTER — Telehealth: Payer: Self-pay | Admitting: *Deleted

## 2021-11-12 NOTE — Telephone Encounter (Signed)
Has visit upcoming 11/23/21 - clearance will be addressed at that office visit. Appointment notes updated.   Patient on Eliquis for atrial fibrillation. Will route to pharmacy team for input.   CHA2DS2-VASc Score = 3   This indicates a 3.2% annual risk of stroke. The patient's score is based upon: CHF History: 0 HTN History: 1 Diabetes History: 0 Stroke History: 0 Vascular Disease History: 0 Age Score: 2 Gender Score: 0     Platelet count: 10/26/2021: Platelets 215.0   Creatinine clearance: 65 mL/min when adjusted for weight  10/26/2021: Creatinine, Ser 1.35; Hemoglobin 10.9   Serum creatinine: 1.35 mg/dL 10/26/21 0953 Estimated creatinine clearance: 64.6 mL/min  Loel Dubonnet, NP  11/12/21  12:26 PM

## 2021-11-12 NOTE — Telephone Encounter (Signed)
   Pre-operative Risk Assessment    Patient Name: Clayton [name]  DOB: [birth year] MRN: [id number]      Request for Surgical Clearance    Procedure:   LAPAROSCOPIC RIGHT HEMICOLECTOMY   Date of Surgery:  Clearance TBD                                 Surgeon:  DR. Harrell Gave [facility] or Practice Name:  [facility] Phone number:  [phone] Fax number:  [phone] ATTN: Malachi Bonds, CMA   Type of Clearance Requested:   - Medical  - Pharmacy:  Hold Apixaban (Eliquis)     Type of Anesthesia:  General    Additional requests/questions:    Jiles Prows   11/12/2021, 11:48 AM

## 2021-11-16 NOTE — Telephone Encounter (Addendum)
Patient with diagnosis of PAF(paroxysmal atrial fibrillation)  on Eliquis 5 mg twice daily  for anticoagulation.    Procedure:  laparoscopic right hemicolectomy  Date of procedure: TBD   CHA2DS2-VASc Score = 3   This indicates a 3.2% annual risk of stroke. The patient's score is based upon: CHF History: 0 HTN History: 1 Diabetes History: 0 Stroke History: 0 Vascular Disease History: 0 Age Score: 2 Gender Score: 0     SrCr: 1.35 (10/26/2021) CrCl : 59 mL/min (Adjt BW)  Platelet count :  215 (10/26/2021)   Per office protocol, patient can hold Eliquis for 2 days prior to procedure.     **This guidance is not considered finalized until pre-operative APP has relayed final recommendations.**

## 2021-11-19 NOTE — Telephone Encounter (Signed)
Called and left v/m on cell phone (okay per DPR). Stated Dr. Claiborne Billings will address concerns and questions during Monday's upcoming appt (11/23/21) along with pre-operative clearance. I gave our office number if he has any further questions.   Will remove this message from pre-op pool. CMA has added "Pre-op clearance" to appt for next week.   Finis Bud, NP

## 2021-11-19 NOTE — Telephone Encounter (Signed)
Patient calling with questions/concerns. Please advise

## 2021-11-23 ENCOUNTER — Ambulatory Visit: Payer: Medicare PPO | Admitting: Cardiovascular Disease

## 2021-11-24 NOTE — Telephone Encounter (Signed)
Clearance faxed to requesting provider via Epic  Patient made aware-see mychart message

## 2021-11-24 NOTE — Telephone Encounter (Signed)
OK for cardiac clearance for patient to have surgery;  need to hold eliquis per rec of pharmacist

## 2021-11-30 DIAGNOSIS — E119 Type 2 diabetes mellitus without complications: Secondary | ICD-10-CM | POA: Diagnosis not present

## 2021-11-30 DIAGNOSIS — Z961 Presence of intraocular lens: Secondary | ICD-10-CM | POA: Diagnosis not present

## 2021-11-30 DIAGNOSIS — H35413 Lattice degeneration of retina, bilateral: Secondary | ICD-10-CM | POA: Diagnosis not present

## 2021-12-01 NOTE — Progress Notes (Signed)
Sent message, via epic in basket, requesting orders in epic from surgeon.  

## 2021-12-02 ENCOUNTER — Encounter: Payer: Self-pay | Admitting: Cardiovascular Disease

## 2021-12-02 ENCOUNTER — Ambulatory Visit: Payer: Medicare PPO | Attending: Cardiovascular Disease | Admitting: Cardiovascular Disease

## 2021-12-02 ENCOUNTER — Ambulatory Visit: Payer: Self-pay | Admitting: Surgery

## 2021-12-02 DIAGNOSIS — R739 Hyperglycemia, unspecified: Secondary | ICD-10-CM

## 2021-12-02 DIAGNOSIS — Z01818 Encounter for other preprocedural examination: Secondary | ICD-10-CM

## 2021-12-02 DIAGNOSIS — E669 Obesity, unspecified: Secondary | ICD-10-CM | POA: Diagnosis not present

## 2021-12-02 DIAGNOSIS — G4733 Obstructive sleep apnea (adult) (pediatric): Secondary | ICD-10-CM

## 2021-12-02 DIAGNOSIS — Z7901 Long term (current) use of anticoagulants: Secondary | ICD-10-CM | POA: Diagnosis not present

## 2021-12-02 DIAGNOSIS — I480 Paroxysmal atrial fibrillation: Secondary | ICD-10-CM | POA: Diagnosis not present

## 2021-12-02 DIAGNOSIS — I10 Essential (primary) hypertension: Secondary | ICD-10-CM

## 2021-12-02 MED ORDER — SPIRONOLACTONE 25 MG PO TABS: ORAL_TABLET | ORAL | 1 refills | Status: DC

## 2021-12-02 NOTE — Patient Instructions (Signed)
Medication Instructions:  Take Spironolactone 25 mg, and 12.5 mg alternating every other day.  You may hold Eliquis 48 hours before surgery.   *If you need a refill on your cardiac medications before your next appointment, please call your pharmacy*   Follow-Up: At [facility], you and your health needs are our priority.  As part of our continuing mission to provide you with exceptional heart care, we have created designated Provider Care Teams.  These Care Teams include your primary Cardiologist (physician) and Advanced Practice Providers (APPs -  Physician Assistants and Nurse Practitioners) who all work together to provide you with the care you need, when you need it.  We recommend signing up for the patient portal called "MyChart".  Sign up information is provided on this After Visit Summary.  MyChart is used to connect with patients for Virtual Visits (Telemedicine).  Patients are able to view lab/test results, encounter notes, upcoming appointments, etc.  Non-urgent messages can be sent to your provider as well.   To learn more about what you can do with MyChart, go to [email or web address].    Your next appointment:   4 month(s)  The format for your next appointment:   In Person  Provider:   Shelva Majestic, MD

## 2021-12-02 NOTE — Progress Notes (Signed)
Patient ID: Clayton [name], male   DOB: 1945-06-23, 76 y.o.   MRN: [id number]        HPI: Clayton [name] is a 76 y.o. male who presents for a one month follow-up cardiology evaluation.  [name] has a history of hypertension, a remote history of PAF, status post cardioversion in 2007, obstructive sleep apnea for which he uses CPAP 100% of the time, hypertension, and hyperlipidemia. He has had weight fluctuations over the years.  In the past, he also had a history of Mnire's disease.  There was some concern by [name] that perhaps this may have been related to statin use, which led to its discontinuance.  He is not had Mnire's disease in some time.  He underwent left hip replacement surgery by Dr. [name] and now has been able to continue to be active and exercises routinely.   His  blood pressure regimen has been amlodipine/benazepril 10/40 daily, hydrochlorothiazide has been taking 25 Mill grams in the evening and Toprol-XL 100 mg daily. He had atrial of livalo  For hyperlipidemia but developed some diarrhea secondary to this.    He has not had much success with weight loss over the past several years.    He continues to use his CPAP therapy with 100% compliance.  He will not even take a nap without his CPAP therapy and he takes his CPAP unit on alll his travels.   Since initiating CPAP therapy, he is unaware of any palpitations.  He denies any recurrent atrial fibrillation.  In the past he has had difficulty with family stress and his father had passed away, and his wife's mother recently died after having developed lung CA and had significant PVD.  He was not exercising as much as he had in the past.  He has not been successful in weight loss.  He continues to be active and still walks 3-4 miles per day.  Oftentimes in the morning when he awakens his blood pressure is elevated at 150/90 but after exercise it drops to 128/80.  He is unaware of any recurrent atrial fibrillation.  He  continues to use CPAP with 100% compliance.   He completed a two-year grieving process.  On 01/19/2016 he committed to weight loss.  On January 1, he weighed 278 pounds .  He has been walking 4 miles 5 days per week as well as some intermittent stationary bike.  He has lost a total of 32 pounds since January.  He continues to use CPAP with 100% compliance and since he has been on CPAP, he has not had any recurrent atrial fibrillation and his blood pressure has been less labile.  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  He has had failures to Crestor and Lipitor in the past due to significant myalgias, development, even with weekly dosing.  I resumed zetia   In August 2018 he noticed his heart rate increasing and he presented for evaluation and was seen by [name], PAC.  He admitted to a rare palpitation.  He denied any chest pain with exertion or dyspnea on exertion.  He has suffered a broken rib.  This year.  He has a weight goal of 220 pounds.    I saw him in September 2018, at which time he was doing well from a cardiovascular standpoint.   His father was ill and ultimately passed away in [place].  As result he was going back and forth to the [facility] area.  This resulted in a change in his diet and ability to exercise.  I last saw him in October 2019 and his weight had increased from 240 back up to 270 pounds.  He was unaware of any episodes of atrial fibrillation.  He was using CPAP with 100% compliance along with his naps.  He was committed to begin weight loss again.    Since I saw him on October 19, 2018 he has had major lifestyle change.  He has been working with Dr. Leafy Ro in the healthy weight and wellness group.  Peak weight was 280 and most recent weight 244.  I saw him for a cardiology evaluation on August 09, 2018.  O ver the past 4 months, he had been under increased stress as result of his wife's illness.  He has been checking his blood pressure regularly and this has  been stable but on his blood pressure recording he has noticed the message of heart rate irregularity.  He is unaware of A. fib but has noticed this message for the last 3 to 4 months.  He is exercising every day and typically walks 2 miles in the morning and in the afternoon or evening does 30 minutes of stationary bike at least 5 to 6 days/week.  He continues to use CPAP.  I obtained a download in the office today from June 22 through August 08, 2018.  He is 100% compliant.  He has a ResMed air sense 10 auto unit with a pressure range of 8-20 with 95% pressure at 10.1 and maximum average and 11.5.  AHI is excellent at 1.2.     During his August 09, 2018 encounter his ECG demonstrated that he was back in atrial fibrillation and had a ventricular rate at 89 bpm.  There was LVH with repolarization changes.  At that time, I recommended discontinuance of amlodipine/benazepril combination and in its place started Cardizem CD 180 mg rather than amlodipine and olmesartan 20 mg rather than benazepril.  I initiated anticoagulation with Eliquis 5 mg twice a day.  He underwent a 2D echo Doppler study on August 16, 2018 which showed EF low normal at 50 to 55%.  There was evidence for significant LVH and moderate dilation of his left atrium.  I saw him for follow-up evaluation on August 23, 2018.  At that time he was now cognizant of his heart rate irregularity.  He has continued to use CPAP with excellent compliance.  A new download was obtained from July 5 through August 21, 2018 which shows 100% compliance with average usage 7 hours and 28 minutes.  AHI is 1.6 and his 95th percentile auto pressure is 10.1 with a maximum average pressure of 11.4.  He states he has lost weight over the past several weeks purposefully.  He continues to exercise now on a stationary bike.  He denies any chest pain PND orthopnea.  He was tolerating Eliquis without bleeding.  During that evaluation, we discussed different options including several  additional weeks of increased weight control versus initiating antiarrhythmic therapy.  Since his only other episode of atrial fibrillation previously occurred 13 years ago and he had not had any recurrence until this year he opted for an increased rate control trial.  As result metoprolol dose was further titrated.  I saw on September 15, 2018 for follow-up evaluation.  At that time remained in atrial fibrillation despite his increase metoprolol succinate to 100 mg in the morning and 50 mg at night with continuation of  diltiazem 180 mg, spironolactone 12.5 mg daily in addition to his olmesartan 20 mg.  I scheduled him for DC cardioversion but because of the need to obtain a COVID test I was unable to schedule this to be done by me prior to going on vacation.  He ultimately underwent the successful cardioversion by Dr. Davina Poke successfully and received 1 shock of 200 J of biphasic synchronized rhythm with restoration of sinus rhythm.  Chemistry profile done prior to the cardioversion revealed his creatinine had increased to 1.69 and as result I recommended he reduce his olmesartan down to 10 mg from his dose of 20 mg.  I saw him on October 10, 2018 in follow-up of his cardioversion.  At that time he was maintaining sinus rhythm and feeling well.  He had more energy.  His stress level had significantly reduced since his wife had successful L5-S1 back surgery by Dr. Vertell Limber.  He was continuing to use CPAP with 100% compliance.    I evaluated him on January 09, 2019.  At that time he stated that over the 3 months previous he had continued to feel well and remained asymptomatic.  At times there are still periods of increased stress.  He continued to use CPAP with 100% compliance.  A download was obtained from November 22 through January 08, 2019 which showed 100% compliance.  Average usage was 6 hours 47 minutes.  AHI is excellent at 1.4 with his AutoSet CPAP minimum pressure set at 8 and maximum of 20, with 95th  percentile pressure 10.2 with maximum average pressure 12.0.  During his evaluation, his ECG verify that he was back in atrial fibrillation.  At the time, he did not inform me that he had noted some irregularity but retrospectively this may have been going on for several weeks prior to that evaluation.  He was continuing to walk daily and exercising on his bike and was asymptomatic without shortness of breath.  During that evaluation I had a long discussion with him regarding possible EP evaluation for consideration of ablation.  After his significant discussion elected to initiate an attempt at antiarrhythmic therapy with low-dose flecainide initially at 50 mg twice a day with plans for office visit in 2 weeks.  He has continued to be on Eliquis for anticoagulation in addition to Zetia for his hyperlipidemia.  When I saw him on January 24, 2019 he stated that he had felt some fatigue once flecainide was instituted.  He was feeling well but this past Sunday he had been working very hard going up and down numerous steps carrying boxes with heavy exertion making at least 40 trips carrying Christmas girls back upstairs.  He was tired.  Sunday evening while sitting down he became disoriented anxious and it appeared that there was possibly some transient stress of aphasia.  He denied any focal weakness.  His symptoms ultimately resolved after 30 minutes.  He had called the office during the workweek and was concerned about his symptoms possibly secondary to flecainide.  He had self reduced his flecainide dose to just once a day rather than twice a day and is worked into my schedule today for follow-up evaluation.  Presently, he has no residual issues since that event several days ago.    At his January 2021 evaluation, I recommended he undergo a neurologic evaluation with Dr. Leonie Man as well as discussed an EP evaluation for possible consideration of future atrial fibrillation ablation.  He saw Dr. Curt Bears on February 08, 2019  and was in persistent atrial fibrillation.  Was discussion concerning continuing flecainide with subsequent high voltage cardioversion.  He saw Dr. Leonie Man February 27, 2019 and after a long evaluation it was felt that his transient episode of word finding difficulty and anomia likely represented mild cognitive impairment rather than TIA or seizure.  He subsequently saw Dr Curt Bears back on March 05, 2019 and on 323 he underwent successful cardioversion with Dr. Oval Linsey which required 3 shocks with restoration of sinus rhythm.  He was evaluated by me in March 2021 at which time he felt his heart rhythm had remained stable.    He was under considerable increased stress to his wife had fallen in sustained a fracture to her humerus as well as tear to her rotator cuff and biceps tendon.  Ul diltiazem was discontinued and he was started on amlodipine 5 mg and was told to continue olmesartan 20 mg in addition to his metoprolol and spironolactone.  Timately she underwent surgery.  He denies chest pain PND orthopnea.  He has continued to use CPAP download from March 05, 2019 through April 03, 2019 continues to show excellent compliance with an AHI of 2.7 and 95th percentile pressure 11.4 cm.   Since I last saw him, he was evaluated by Roby Lofts June 20, 2019.  At that time, he was getting to stroke with his CPAP but seen for the first 10 minutes before he was able to settle down and fall asleep.  He denies chest pain or shortness of breath.  He had noted some elevated blood pressures as well as some dyspnea on exertion.  During that evaluation his diltiazem was discontinued and he was started on amlodipine 5 mg.  He was told to continue his present dose of olmesartan, metoprolol, spironolactone.  During that evaluation he was maintaining sinus rhythm but had bradycardia and first-degree heart block.  Flecainide was reduced down to 50 mg twice a day since his symptoms of fatigability and dyspnea seem to  occur at the higher dose.  With return to a lower dose his symptoms resolved.  I evaluated him in June 2021 over the prior 3 months he denied any recurrent episodes of chest pain.    He continues to have issues in the first 10 minutes of his CPAP use which I suspect may be related to not having a ramp time on his machine which can easily be adjusted.  His blood pressure does continue to be somewhat labile but he states at home most of the time it is controlled.  He was unaware of any recurrent episodes of atrial fibrillation.  He denied presyncope or syncope.    He underwent a nuclear stress test which apparently had been ordered for dyspnea on exertion.  This was low risk study and demonstrated a hypertensive response to exercise.  A defect was felt to be present in the inferior wall which was interpreted as scar with mild peri-infarct ischemia, which may also have been exacerbated by his body habitus and diaphragmatic attenuation.  Currently he remains asymptomatic and denies chest pain or shortness of breath.  He has been evaluated by Dr. Hermina Barters for left cochlear Mnire's disease.  He underwent audiogram which showed mild to moderate severity high-frequency sensorineural neural hearing loss in the right ear and mild to moderately severe downsloping sensorineural neural hearing loss in the left ear.   I saw him in October 2021 at which time he denied any recurrent episodes of atrial fibrillation.  He  continued to be on amlodipine 5 mg, metoprolol XL 100 mg in the evening with 25 or 50 mg in the morning, olmesartan 40 mg, and spironolactone 12.5 mg daily.  He was on Eliquis for anticoagulation.  He wastolerating Livalo 2 mg and Zetia for hyperlipidemia with LDL cholesterol at 55 in June 2021.  He tries to play golf several times a week if possible and continues to score well.  He continues to be followed by Dr. Dennard Nip with plans for continued weight loss.  I saw him in February 2022 and  since his prior evaluation he was having issues with his knees.   He had undergone removal of his meniscus.  He has been followed by Charlann Boxer he recently had a hyaluronic acid injection with benefit.  He also has undergone evaluation with Dr. Ihor Gully with discomfort in his right hip.  He has low back issues at L5-S1 and is now on gabapentin.  He is unaware of recurrent atrial fibrillation.  Apparently he is was only taking flecainide 50 mg daily instead of twice a day.  He continued to be on amlodipine 7.5 mg, Eliquis 5 mg twice a day, metoprolol succinate 150 mg in the evening and spironolactone 12.5 mg daily.  At times he he has some mild blood pressure elevation.  He continued to be evaluated at healthy weight and wellness.  Because of his knee and hip issues he has not been playing as much golf as he had in the past.  He continuesd to use CPAP with 100% compliance. A download was obtained from January 20 through March 07, 2020.  He is averaging 7 hours and 2 minutes of CPAP use per night.  AHI is 1.5 with a 95th percentile pressure at 11.5 and maximum average pressure at 13.  During that evaluation, his QTc interval was prolonged at 497 and rather than further increase flecainide I recommended titration of metoprolol to 50 mg in the morning and 100 mg at night.  He continued to be on Livalo 2 mg daily and Zetia for hyperlipidemia.  I saw him on July 17, 2020.  Since his prior evaluation he developed an episode of recurrent atrial fibrillation and was seen by Jory Sims, NP on June 27, 2020 and was scheduled to see Dr. Curt Bears on June 30, 2020.  During that evaluation, his flecainide was discontinued and amiodarone 400 mg twice a day was initiated for 2 weeks with plans to reduce to 400 mg for 2 weeks and then 200 mg daily.  He was scheduled to undergo DC cardioversion on July 14 2020 and upon arrival for the procedure he was found to have converted to sinus rhythm and cardioversion was not performed.   When seen by Dr. Curt Bears, the patient had already a scheduled right hip replacement surgery to be done by Dr. [name] on August 12, 2020.  Dr. Curt Bears has scheduled Mr. Geffre to undergo cardioversion in late August 2022.  He was using CPAP with 100% compliance with AHI of 1.7 and 95th percentile pressure 10.7 with maximum average pressure 12.7 centimeters of water.  He underwent CT cardiac morphology/pulmonary vein prior to his ablation.  Calcium score was 42.7 Aggrastat units which is the 25th percentile for age, race and sex matched control.  He had an upper normal ascending aorta size at 3.9 cm.  He underwent successful atrial fibrillation ablation with Dr. Curt Bears on September 02, 2020.  He saw him in follow-up on December 16, 2020 and was maintaining  sinus rhythm and was taken off flecainide.  I saw him on January 22, 2021 at which time he was maintaining sinus rhythm and continued to be on  Eliquis 5 mg twice a day.  He has been on amlodipine 7. 5 mg daily, metoprolol succinate 50 mg twice a day, olmesartan 40 mg daily in addition to spironolactone 12.5 mg daily for hypertension.  He is on Livalo 2 mg with Zetia 10 mg for hyperlipidemia.  He continues to see Dr. Leafy Ro at healthy weight and wellness program.  He has resumed his activity and is walking 2 miles in the morning and riding a stationary bike for 30 minutes later in the afternoon.    Since I last saw him, he underwent an echo Doppler study on July 24, 2021 which showed EF 50 to 55% (54.2% on biplane).  He had moderate left atrial dilatation.  There was mild dilation of his aortic root at 43 mm and aortic arch at 45 mm.  He has been seen by Dr. Curt Bears and more recently by Barrington Ellison, PA-C.  He was noted to have very brief SVT on a long-term monitor.  He has continued to use CPAP and a download from September 7 through October 23, 2021 showed average use at 7 hours and 37 minutes per night.  AHI was excellent at 0.9 with his pressure range 6 to 8  to 20 cm of water.  He had never had a colonoscopy recently had a Cologuard test recommended by his primary physician which was positive.  He subsequently underwent colonoscopy by Dr. Hinton Dyer and a small mass was detected in the colon with biopsy positive for malignancy.  He is undergoing further evaluation with CT imaging with plans to for surgical consultation.    Since my last evaluation, he underwent CT imaging and no findings of metastatic disease in the chest abdomen or pelvis was detected.  He is tentatively scheduled to undergo surgery on December 4 with Dr. Nadeen Landau for resection of his colonic mass.  Presently, he continues to feel well.  He denies any chest pain or shortness of breath.  He continues to be on amlodipine 5 mg, metoprolol succinate 100 mg twice a day, olmesartan 40 mg, and spironolactone 12.5 mg daily for hypertension.  He is on Eliquis anticoagulation and denies bleeding.  He is on Zetia/Livalo for hyperlipidemia.  He has been taking over-the-counter ferrous sulfate.  He continues to use CPAP with 100% compliance.  CPAP setting is 8 to 20 cm of water and AHI is excellent at 0.7 with 95th percentile pressure 10.5 with maximum average pressure 11.9 cm of water.  He presents for preoperative clearance.  Past Medical History:  Diagnosis Date   Arthritis    hips    Atrial fibrillation (HCC)    Cataract    Cochlear Meniere syndrome of left ear 06/09/2015   CPAP (continuous positive airway pressure) dependence    Dysrhythmia    PAF- hx of 7 years ago    HLD (hyperlipidemia)    Hypertension 05/29/2010   ECHO-EF 67% wnl   Meniere disease    Obesity    Palpitations    S/P right total hip arthroplasty 08/12/2020   Sleep apnea 09/14/2004   [facility]- Dr. Humphrey Rolls; CPAP titration 11/12/04- Dr. Humphrey Rolls    Past Surgical History:  Procedure Laterality Date   ATRIAL FIBRILLATION ABLATION N/A 09/10/2020   Procedure: ATRIAL FIBRILLATION ABLATION;  Surgeon: Constance Haw, MD;  Location: [facility]  INVASIVE CV LAB;  Service: Cardiovascular;  Laterality: N/A;   CARDIAC CATHETERIZATION  11/03/2005   CARDIOVERSION N/A 09/26/2018   Procedure: CARDIOVERSION;  Surgeon: Geralynn Rile, MD;  Location: [place];  Service: Endoscopy;  Laterality: N/A;   CARDIOVERSION N/A 03/13/2019   Procedure: CARDIOVERSION;  Surgeon: Skeet Latch, MD;  Location: [place];  Service: Cardiovascular;  Laterality: N/A;   CARDIOVERSION N/A 03/03/2021   Procedure: CARDIOVERSION;  Surgeon: Josue Hector, MD;  Location: [facility] ENDOSCOPY;  Service: Cardiovascular;  Laterality: N/A;   CATARACT EXTRACTION  01/19/2012   KNEE ARTHROSCOPY     left knee- 2002    TOTAL HIP ARTHROPLASTY  06/08/2011   Procedure: TOTAL HIP ARTHROPLASTY ANTERIOR APPROACH;  Surgeon: [name], MD;  Location: [facility];  Service: Orthopedics;  Laterality: Left;   TOTAL HIP ARTHROPLASTY Right 08/12/2020   ANTERIOR APPROACH   TOTAL HIP ARTHROPLASTY Right 08/12/2020   Procedure: TOTAL HIP ARTHROPLASTY ANTERIOR APPROACH;  Surgeon: Paralee Cancel, MD;  Location: [facility];  Service: Orthopedics;  Laterality: Right;    No Known Allergies   Current Outpatient Medications  Medication Sig Dispense Refill   amLODipine (NORVASC) 5 MG tablet Take 1.5 tablets (7.5 mg total) by mouth daily. 135 tablet 2   Coenzyme Q10 200 MG capsule Take 200 mg by mouth in the morning.     ELIQUIS 5 MG TABS tablet TAKE 1 TABLET BY MOUTH TWICE A DAY 180 tablet 1   ezetimibe (ZETIA) 10 MG tablet Take 1 tablet (10 mg total) by mouth in the morning. 90 tablet 1   metFORMIN (GLUCOPHAGE) 500 MG tablet Take 1 tablet (500 mg total) by mouth daily with breakfast. 90 tablet 1   metoprolol succinate (TOPROL-XL) 100 MG 24 hr tablet Take 1 tablet (100 mg total) by mouth daily. (Patient taking differently: Take 50 mg by mouth in the morning and at bedtime.) 90 tablet 1   Multiple Vitamins-Minerals (MULTIVITAMIN WITH MINERALS) tablet Take 1  tablet by mouth daily.     olmesartan (BENICAR) 40 MG tablet TAKE 1 TABLET BY MOUTH EVERY DAY 90 tablet 3   Pitavastatin Calcium (LIVALO) 2 MG TABS Take 1 tablet (2 mg total) by mouth every evening. 90 tablet 1   tamsulosin (FLOMAX) 0.4 MG CAPS capsule Take 0.4 mg by mouth in the morning.     triamcinolone cream (KENALOG) 0.5 % APPLY 1 APPLICATION TOPICALLY 2 (TWO) TIMES DAILY. TO AFFECTED AREAS. (Patient taking differently: Apply 1 application  topically daily as needed (rash).) 30 g 3   ferrous sulfate 325 (65 FE) MG EC tablet Take 325 mg by mouth in the morning and at bedtime.     spironolactone (ALDACTONE) 25 MG tablet Take 1 tablet (25 mg) alternating every other day with 0.5 tablet (12.5 mg) (Patient taking differently: Take 12.5 mg by mouth daily.) 45 tablet 1   No current facility-administered medications for this visit.    Socially he is remarried after his first wife passed away. He has 2 children. He is retired  and was the Building control surveyor at [employer]. there is no tobacco history.  He plays golf at least 2 days/week and this past year not only did he have a hole in 1 but he has also shot his age.  ROS General: Negative; No fevers, chills, or night sweats;  HEENT: Negative; No changes in vision or hearing, sinus congestion, difficulty swallowing Pulmonary: Negative; No cough, wheezing, shortness of breath, hemoptysis Cardiovascular: See HPI GI: Recent colonoscopy with colonic mass.  GU: Negative; No dysuria, hematuria, or difficulty voiding Musculoskeletal: He is status post rib fracture.  He is status post hip replacement. Hematologic/Oncology: Negative; no easy bruising, bleeding Endocrine: Negative; no heat/cold intolerance; no diabetes Neuro: No vertigo or imbalance issues Skin: Negative; No rashes or skin lesions Psychiatric: Negative; No behavioral problems, depression Sleep: Positive for obstructive sleep apnea on CPAP therapy with 100% compliance No snoring, daytime  sleepiness, hypersomnolence, bruxism, restless legs, hypnogognic hallucinations, no cataplexy Other comprehensive 14 point system review is negative.  PE BP (!) 150/70   Pulse 72   Ht _0  (1.905 m)   Wt 265 lb 9.6 oz (120.5 kg)   SpO2 98%   BMI 33.20 kg/m    Repeat blood pressure by me was elevated at 150/76.    Wt Readings from Last 3 Encounters:  12/07/21 250 lb (113.4 kg)  12/02/21 265 lb 9.6 oz (120.5 kg)  11/05/21 253 lb (114.8 kg)  In 2021 he had lost approximately 40 pounds   General: Alert, oriented, no distress.  Skin: normal turgor, no rashes, warm and dry HEENT: Normocephalic, atraumatic. Pupils equal round and reactive to light; sclera anicteric; extraocular muscles intact; Nose without nasal septal hypertrophy Mouth/Parynx benign; Mallinpatti scale 3 Neck: No JVD, no carotid bruits; normal carotid upstroke Lungs: clear to ausculatation and percussion; no wheezing or rales Chest wall: without tenderness to palpitation Heart: PMI not displaced, RRR, s1 s2 normal, 1/6 systolic murmur, no diastolic murmur, no rubs, gallops, thrills, or heaves Abdomen: soft, nontender; no hepatosplenomehaly, BS+; abdominal aorta nontender and not dilated by palpation. Back: no CVA tenderness Pulses 2+ Musculoskeletal: full range of motion, normal strength, no joint deformities Extremities: no clubbing cyanosis or edema, Homan's sign negative  Neurologic: grossly nonfocal; Cranial nerves grossly wnl Psychologic: Normal mood and affect    December 02, 2021 ECG (independently read by me):  NSR at 72, RBBB, LAD, QTc 479 msec  October 26, 2021 ECG (independently read by me): NSR at 77; RBBB, LAHB, LVH; QTc 484 msec   January 22, 2021 ECG (independently read by me):  NSR at 76, RBBB: PR 200 msec  July 17, 2020 ECG (independently read by me): NSR at 70; RBBB, 1st degree AV block; PR 220 msec; QTc 492 msec  March 10, 2020 ECG (independently read by me): NSR at 82; RBBB with  repoarization, QTc 497 msec  November 06, 2019 ECG (independently read by me): Sinus rhythm at 64; 1st degree AV block; PR 212 msec;RBBB  July 09, 2019 ECG (independently read by me): Normal sinus rhythm at 70 bpm, first-degree AV block with a.  PR interval at 212 ms.  Right bundle branch block with repolarization changes.  Borderline LVH by voltage criteria.  QTc interval 470 ms.  April 04, 2019 ECG (independently read by me): Sinus bradycardia 59 bpm, first-degree AV block with a PR of 248 ms.  LVH.  Inferior Q waves, unchanged  January 24, 2019 ECG (independently read by me): Atrial fibrillation at 83 bpm.  Borderline LVH by voltage criteria in aVL; QTc interval 446 ms.  January 09, 2019 ECG (independently read by me): Atrial fibrillation ar 97, RBBB; Left axis deviation; Inferior Q waves, unchanged. QTc 487    August 09, 2018 ECG (independently read by me): Normal sinus rhythm at 72 bpm.  Left Axis deviation.  QTc interval 429 ms.  September 15, 2018 ECG (independently read by me): Atrial fibrillation at 92 bpm.  Right bundle branch block with repolarization changes.  August 23, 2018 ECG (independently read by me): Atrial fibrillation at 96 bpm.  LVH with repolarization changes.  Left axis deviation.  Left anterior hemiblock.  Inferior Q waves  August 09, 2018 ECG (independently read by me):Atrial fibrillatoion at 41; LVH with repolarization  October 219 ECG (independently read by me): Normal sinus rhythm at 81 bpm.  Nonspecific intraventricular conduction delay.  Left axis deviation  September 2018 ECG (independently read by me): Normal sinus rhythm at 63 bpm.  Left bundle branch block with repolarization changes.  Normal intervals.  No ectopy.  April 2018 ECG (independently read by me): Normal sinus rhythm at 68 bpm.  LVH with repolarization.  Normal intervals.  November 2017 ECG (independently read by me): Normal sinus rhythm at 78 bpm.  Nonspecific interventricular delay.  QRS complex V1  V2.  August 2016 ECG (independently read by me): Normal sinus rhythm at 77 bpm.  LVH with repolarization changes.  Left axis deviation.  December 2015 ECG (independently read by me): Normal sinus rhythm at 87 bpm.  LVH by voltage criteria with T-wave changes in leads 1 and aVL.  July 2014 ECG: Normal sinus rhythm at 76 beats per minute. LVH with QRS widening.  LABS:     Latest Ref Rng & Units 10/26/2021    9:53 AM 08/05/2021    8:19 AM 07/08/2021   11:00 AM  BMP  Glucose 70 - 99 mg/dL 97  104  104   BUN 6 - 23 mg/dL _0 Creatinine 0.40 - 1.50 mg/dL 1.35  1.32  1.37   BUN/Creat Ratio 10 - 24   22   Sodium 135 - 145 mEq/L 138  139  138   Potassium 3.5 - 5.1 mEq/L 4.5  4.7  4.5   Chloride 96 - 112 mEq/L 107  108  104   CO2 19 - 32 mEq/L _1 Calcium 8.4 - 10.5 mg/dL 10.5  9.7    9.5  10.4       Latest Ref Rng & Units 10/26/2021    9:53 AM 08/05/2021    8:19 AM 07/08/2021   11:00 AM  Hepatic Function  Total Protein 6.0 - 8.3 g/dL 7.3  6.3  7.0   Albumin 3.5 - 5.2 g/dL 4.4  4.3  4.6   AST 0 - 37 U/L _2 ALT 0 - 53 U/L _3 Alk Phosphatase 39 - 117 U/L 63  55  80   Total Bilirubin 0.2 - 1.2 mg/dL 0.7  0.9  0.8       Latest Ref Rng & Units 10/26/2021    9:53 AM 07/08/2021   11:00 AM 02/13/2021    3:37 PM  CBC  WBC 4.0 - 10.5 K/uL 4.6  7.3  7.3   Hemoglobin 13.0 - 17.0 g/dL 10.9  13.8  15.3   Hematocrit 39.0 - 52.0 % 33.2  42.0  46.2   Platelets 150.0 - 400.0 K/uL 215.0  198  189    Lab Results  Component Value Date   MCV 79.1 10/26/2021   MCV 88 07/08/2021   MCV 92 02/13/2021   Lab Results  Component Value Date   TSH 1.010 07/08/2021    Lab Results  Component Value Date   HGBA1C 6.0 08/05/2021    Lipid Panel     Component Value Date/Time   CHOL 113 08/05/2021 0819   CHOL 120  08/01/2020 1031   TRIG 50.0 08/05/2021 0819   HDL 48.40 08/05/2021 0819   HDL 54 08/01/2020 1031   CHOLHDL 2 08/05/2021 0819   VLDL 10.0 08/05/2021 0819    LDLCALC 55 08/05/2021 0819   LDLCALC 50 08/01/2020 1031   LDLCALC 119 (H) 10/12/2016 0902     RADIOLOGY: No results found.  IMPRESSION:  1. Preoperative clearance   2. Paroxysmal atrial fibrillation  Hospital): s/p AF ablation September 02, 2020, Dr. Curt Bears   3. Primary hypertension   4. OSA on CPAP   5. Anticoagulated   6. Mild obesity     ASSESSMENT AND PLAN: Mr Ferrebee is a 76 year old gentleman who has a long-standing history of hypertension and remotely had been on amlodipine/benazepril 10/40, HCTZ 25 mg, Toprol-XL 100 mg in addition to spironolactone 12.5 mg daily.  He had had an iisolated episode of atrial fibrillation in 2007 and had undergone cardioversion.  On this regimen his blood pressure had stabilized and consistently was well controlled and he had not had any recurrent episodes of atrial fibrillation.   He had gained significant amount of weight since August 2019.  He initiated a major lifestyle change and has been working closely with Dr. Leafy Ro from [facility] resulting in a greater than 40 pound weight loss in 2021.   When I saw him in late July 2020 he was in atrial fibrillation of questionable duration.  At the time retrospectively he believes he may have been in A. fib for 3 to 4 months duration.  I gradually titrated his medical regimen and changed his benazepril to diltiazem and olmesartan to take in addition to metoprolol succinate and his spironolactone 12.5 mg daily.  He had continued to use CPAP with 100% compliance.  His only previous episode of A. fib prior to this episode had occurred in 2007 and he had maintained sinus rhythm until his recurrent episode.  He underwent successful cardioversion on September 26, 2018 with restoration of sinus rhythm.  When I saw him on January 09, 2019 he was continuing to have periods of increased stress and ECG again showed recurrent atrial fibrillation of questionable duration.  Retrospectively he felt he may  have been in atrial fibrillation for 3 to 4 weeks duration prior to that visit.  He also had an episode of disorientation with possible transient expressive aphasia and underwent neurologic evaluation with Dr. Leonie Man and also EP evaluation with Dr. Curt Bears.  He was felt that his word finding difficulty and anomia most likely represented mild cognitive impairment rather than a TIA or stroke.  He had been on increased dose of flecainide up to 100 mg twice a day after his subsequent cardioversion. An echo Doppler study in January 2021 showed an EF of 50 to 55% without regional wall motion abnormalities.  When I last saw him in February 2022 he was asymptomatic without chest pain or shortness of breath and was maintaining sinus rhythm.  He subsequently reverted back to atrial fibrillation and his flecainide dose was discontinued and he was started on amiodarone initially 400 mg twice a day when seen by Dr. Curt Bears on June 30, 2020.  He had pharmacologically converted to sinus rhythm when he presented for outpatient DC cardioversion on February 14, 2020.  Since my last evaluation in June 2022, he underwent right successful hip replacement surgery by Dr. [name] and also successful atrial fibrillation ablation by Dr. Curt Bears.  He has been maintaining sinus rhythm and is now off flecainide  and amiodarone.  He had never undergone prior colonoscopy but was recently found to have a colonic mass noted on colonoscopy after a Cologuard test was positive.  Subsequent CT imaging demonstrated the a sending colonic primary but there was no findings of metastatic disease in the chest, abdomen or pelvis.  He did have probable cholelithiasis.  Presently he is stable from a cardiac standpoint.  He does not have any chest pain or shortness of breath.  His most recent echo Doppler study from July 2023 shows EF at 50 to 55% (54.2% by biplane).  There was mild LVH and mild grade 1 diastolic dysfunction.  His left atrium was moderately dilated  and there was mild dilatation of his aortic root measuring 43 mm, ascending aorta at 42 mm and aortic arch at 45 mm.  His ECG today confirms sinus rhythm with previously noted right bundle branch block and left axis deviation.  I am giving him clearance for him to undergo planned surgery which is tentatively scheduled for December 21, 2021 with Dr. Nadeen Landau.  Mr. Naaman Plummer continues to use CPAP therapy with excellent compliance and benefit.  His blood pressure today is elevated and there is trace ankle edema.  I have suggested slight titration of spironolactone from 12.5 mg daily to 12.5 mg alternating with 25 mg every other day.  He will continue with his current regimen of amlodipine 5 mg, metoprolol succinate 100 mg, and olmesartan.  I will see him in 4 months for reevaluation or sooner as needed.    [name], MD, [facility]  12/07/2021 1:50 PM

## 2021-12-03 NOTE — Progress Notes (Signed)
[place] Phone: [phone] Subjective:   Clayton [name], am serving as [name] scribe for Dr. Hulan Saas.  I'm seeing this patient by the request  of:  Kuneff, Renee A, DO  CC: Back and neck pain follow-up  SEG:[id number]  Clayton [name] is [name] 76 y.o. male coming in with complaint of back and neck pain. OMT 10/26/2021. Patient states that his knee pain is manageable. Back pain has been manageable.  Patient has been taking iron and has felt significantly better since he has done that.  Medications patient has been prescribed: None  Taking:         Reviewed prior external information including notes and imaging from previsou exam, outside providers and external EMR if available.   As well as notes that were available from care everywhere and other healthcare systems.  Past medical history, social, surgical and family history all reviewed in electronic medical record.  [name] pertanent information unless stated regarding to the chief complaint.   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 Humphrey Rolls; CPAP titration 11/12/04- Dr. Humphrey Rolls    [name] Known Allergies   Review of Systems:  [name] headache, visual changes, nausea, vomiting, diarrhea, constipation, dizziness, abdominal pain, skin rash, fevers, chills, night sweats, weight loss, swollen lymph nodes, body aches, joint swelling, chest pain, shortness of breath, mood changes. POSITIVE muscle aches  Objective  Blood pressure 138/86, pulse 77,  height '6\' 3"'$  (1.905 m), weight 250 lb (113.4 kg), SpO2 97 %.   General: [name] apparent distress alert and oriented x3 mood and affect normal, dressed appropriately.  HEENT: Pupils equal, extraocular movements intact  Respiratory: Patient's speak in full sentences and does not appear short of breath  Cardiovascular: [name] lower extremity edema, non tender, [name] erythema  Gait relatively normal MSK:  Back back exam does have some mild loss of lordosis.  Some tenderness to palpation of the paraspinal musculature.  Patient does have tightness with FABER test right greater than left.  Negative straight leg test.  Worsening pain with the last 5 degrees of extension  Osteopathic findings  C2 flexed rotated and side bent right C5 flexed rotated and side bent left T3 extended rotated and side bent right inhaled rib T8 extended rotated and side bent left L3 flexed rotated and side bent right Sacrum right on right       Assessment and Plan:  Degenerative disc disease, lumbar Degenerative disc disease.  Patient is having his ascending colon removed in 1 week's time.  Will need to do physical therapy once he is cleared probably.  Follow-up with me again in 6 weeks after surgery.    Nonallopathic problems  Decision today to treat with OMT was based on Physical Exam  After verbal consent patient was treated with HVLA, ME, FPR techniques in cervical, rib, thoracic, lumbar, and sacral  areas  Patient tolerated the procedure well with  improvement in symptoms  Patient given exercises, stretches and lifestyle modifications  See medications in patient instructions if given  Patient will follow up in 4-8 weeks     The above documentation has been reviewed and is accurate and complete [name], DO         Note: This dictation was prepared with Dragon dictation along with smaller phrase technology. Any transcriptional errors that result from this process are unintentional.

## 2021-12-03 NOTE — Patient Instructions (Addendum)
DUE TO COVID-19 ONLY TWO VISITORS  (aged 75 and older)  ARE ALLOWED TO COME WITH YOU AND STAY IN THE WAITING ROOM ONLY DURING PRE OP AND PROCEDURE.   **NO VISITORS ARE ALLOWED IN THE SHORT STAY AREA OR RECOVERY ROOM!!**  IF YOU WILL BE ADMITTED INTO THE HOSPITAL YOU ARE ALLOWED ONLY FOUR SUPPORT PEOPLE DURING VISITATION HOURS ONLY (7 AM -8PM)   The support person(s) must pass our screening, gel in and out, and wear a mask at all times, including in the patient's room. Patients must also wear a mask when staff or their support person are in the room. Visitors GUEST BADGE MUST BE WORN VISIBLY  One adult visitor may remain with you overnight and MUST be in the room by 8 P.M.     Your procedure is scheduled on: 12/21/21   Report to [facility] Main Entrance    Report to admitting at  10:15 AM   Call this number if you have problems the morning of surgery [phone]   Do not eat food :After Midnight.   After Midnight you may have the following liquids until _9:30_ AM/  DAY OF SURGERY  Water Black Coffee (sugar ok, NO MILK/CREAM OR CREAMERS)  Tea (sugar ok, NO MILK/CREAM OR CREAMERS) regular and decaf                             Plain Jell-O (NO RED)                                           Fruit ices (not with fruit pulp, NO RED)                                     Popsicles (NO RED)                                                                  Juice: apple, WHITE grape, WHITE cranberry Sports drinks like Gatorade (NO RED)              Drink 2 Ensure/G2 drinks AT 10:00 PM the night before surgery.        The day of surgery:  Drink ONE (1) Pre-Surgery Clear Ensure  at 9:15 AM the morning of surgery. Drink in one sitting. Do not sip.  This drink was given to you during your hospital  pre-op appointment visit. Nothing else to drink after completing the  Pre-Surgery Clear Ensure at 9:30 AM          If you have questions, please contact your surgeon's office.   FOLLOW  BOWEL PREP AND ANY ADDITIONAL PRE OP INSTRUCTIONS YOU RECEIVED FROM YOUR SURGEON'S OFFICE!!!  Drink plenty of fluids on the day of prep to prevent dehydration   Oral Hygiene is also important to reduce your risk of infection.  Remember - BRUSH YOUR TEETH THE MORNING OF SURGERY WITH YOUR REGULAR TOOTHPASTE    Take these medicines the morning of surgery with A SIP OF WATER: Ezetimibe- Zetia                                                                                                                           Metoprolol                                                                                                                           Amlodipine                                                                                                                           Tamsulosin   Before surgery.Stop taking __Eliquis_________on __12/1________as instructed by _____________.  Stop taking ____________as directed by your Surgeon/Cardiologist.  Contact your Surgeon/Cardiologist for instructions on Anticoagulant Therapy prior to surgery.   Bring CPAP mask and tubing day of surgery.                              You may not have any metal on your body including  jewelry, and body piercing             Do not wear lotions, powders, perfumes/cologne, or deodorant                Men may shave face and neck.   Do not bring valuables to the hospital. [place].   Contacts, dentures or bridgework may not be worn into surgery.   Bring small overnight bag day of surgery.   DO NOT [place]. PHARMACY WILL DISPENSE MEDICATIONS LISTED ON YOUR MEDICATION LIST TO YOU DURING YOUR ADMISSION  [place]!     Special Instructions: Bring a copy of your healthcare power of attorney and living will documents  the day of surgery if you haven't scanned them before.              Please  read over the following fact sheets you were given: IF YOU HAVE QUESTIONS ABOUT YOUR PRE-OP INSTRUCTIONS PLEASE CALL [phone]    [facility] - Preparing for Surgery Before surgery, you can play an important role.  Because skin is not sterile, your skin needs to be as free of germs as possible.  You can reduce the number of germs on your skin by washing with CHG (chlorahexidine gluconate) soap before surgery.  CHG is an antiseptic cleaner which kills germs and bonds with the skin to continue killing germs even after washing. Please DO NOT use if you have an allergy to CHG or antibacterial soaps.  If your skin becomes reddened/irritated stop using the CHG and inform your nurse when you arrive at Short Stay. You may shave your face/neck. Please follow these instructions carefully:  1.  Shower with CHG Soap the night before surgery and the  morning of Surgery.  2.  If you choose to wash your hair, wash your hair first as usual with your  normal  shampoo.  3.  After you shampoo, rinse your hair and body thoroughly to remove the  shampoo.                            4.  Use CHG as you would any other liquid soap.  You can apply chg directly  to the skin and wash                       Gently with a scrungie or clean washcloth.  5.  Apply the CHG Soap to your body ONLY FROM THE NECK DOWN.   Do not use on face/ open                           Wound or open sores. Avoid contact with eyes, ears mouth and genitals (private parts).                       Wash face,  Genitals (private parts) with your normal soap.             6.  Wash thoroughly, paying special attention to the area where your surgery  will be performed.  7.  Thoroughly rinse your body with warm water from the neck down.  8.  DO NOT shower/wash with your normal soap after using and rinsing off  the CHG Soap.                9.  Pat yourself dry with a clean towel.            10.  Wear clean pajamas.            11.  Place clean sheets on your  bed the night of your first shower and do not  sleep with pets. Day of Surgery : Do not apply any lotions/deodorants the morning of surgery.  Please wear clean clothes to the hospital/surgery center.  FAILURE TO FOLLOW THESE INSTRUCTIONS MAY RESULT IN THE CANCELLATION OF YOUR SURGERY    ________________________________________________________________________  Incentive Spirometer  An incentive spirometer is  a tool that can help keep your lungs clear and active. This tool measures how well you are filling your lungs with each breath. Taking long deep breaths may help reverse or decrease the chance of developing breathing (pulmonary) problems (especially infection) following: A long period of time when you are unable to move or be active. BEFORE THE PROCEDURE  If the spirometer includes an indicator to show your best effort, your nurse or respiratory therapist will set it to a desired goal. If possible, sit up straight or lean slightly forward. Try not to slouch. Hold the incentive spirometer in an upright position. INSTRUCTIONS FOR USE  Sit on the edge of your bed if possible, or sit up as far as you can in bed or on a chair. Hold the incentive spirometer in an upright position. Breathe out normally. Place the mouthpiece in your mouth and seal your lips tightly around it. Breathe in slowly and as deeply as possible, raising the piston or the ball toward the top of the column. Hold your breath for 3-5 seconds or for as long as possible. Allow the piston or ball to fall to the bottom of the column. Remove the mouthpiece from your mouth and breathe out normally. Rest for a few seconds and repeat Steps 1 through 7 at least 10 times every 1-2 hours when you are awake. Take your time and take a few normal breaths between deep breaths. The spirometer may include an indicator to show your best effort. Use the indicator as a goal to work toward during each repetition. After each set of 10 deep  breaths, practice coughing to be sure your lungs are clear. If you have an incision (the cut made at the time of surgery), support your incision when coughing by placing a pillow or rolled up towels firmly against it. Once you are able to get out of bed, walk around indoors and cough well. You may stop using the incentive spirometer when instructed by your caregiver.  RISKS AND COMPLICATIONS Take your time so you do not get dizzy or light-headed. If you are in pain, you may need to take or ask for pain medication before doing incentive spirometry. It is harder to take a deep breath if you are having pain. AFTER USE Rest and breathe slowly and easily. It can be helpful to keep track of a log of your progress. Your caregiver can provide you with a simple table to help with this. If you are using the spirometer at home, follow these instructions: [place] IF:  You are having difficultly using the spirometer. You have trouble using the spirometer as often as instructed. Your pain medication is not giving enough relief while using the spirometer. You develop fever of 100.5 F (38.1 C) or higher. SEEK IMMEDIATE MEDICAL CARE IF:  You cough up bloody sputum that had not been present before. You develop fever of 102 F (38.9 C) or greater. You develop worsening pain at or near the incision site. MAKE SURE YOU:  Understand these instructions. Will watch your condition. Will get help right away if you are not doing well or get worse. Document Released: 05/17/2006 Document Revised: 03/29/2011 Document Reviewed: 07/18/2006 [facility] Patient Information 2014 [place], [place].   ________________________________________________________________________

## 2021-12-04 ENCOUNTER — Ambulatory Visit: Payer: Medicare PPO | Admitting: Psychologist

## 2021-12-07 ENCOUNTER — Encounter: Payer: Self-pay | Admitting: Family Medicine

## 2021-12-07 ENCOUNTER — Ambulatory Visit: Payer: Medicare PPO | Admitting: Family Medicine

## 2021-12-07 ENCOUNTER — Encounter: Payer: Self-pay | Admitting: Cardiovascular Disease

## 2021-12-07 ENCOUNTER — Ambulatory Visit (INDEPENDENT_AMBULATORY_CARE_PROVIDER_SITE_OTHER): Payer: Medicare PPO | Admitting: Family Medicine

## 2021-12-07 VITALS — BP 138/86 | HR 77 | Ht 75.0 in | Wt 250.0 lb

## 2021-12-07 DIAGNOSIS — M9904 Segmental and somatic dysfunction of sacral region: Secondary | ICD-10-CM | POA: Diagnosis not present

## 2021-12-07 DIAGNOSIS — M9902 Segmental and somatic dysfunction of thoracic region: Secondary | ICD-10-CM | POA: Diagnosis not present

## 2021-12-07 DIAGNOSIS — M9901 Segmental and somatic dysfunction of cervical region: Secondary | ICD-10-CM | POA: Diagnosis not present

## 2021-12-07 DIAGNOSIS — M5136 Other intervertebral disc degeneration, lumbar region: Secondary | ICD-10-CM | POA: Diagnosis not present

## 2021-12-07 DIAGNOSIS — M9903 Segmental and somatic dysfunction of lumbar region: Secondary | ICD-10-CM

## 2021-12-07 NOTE — Assessment & Plan Note (Signed)
Degenerative disc disease.  Patient is having his ascending colon removed in 1 week's time.  Will need to do physical therapy once he is cleared probably.  Follow-up with me again in 6 weeks after surgery.

## 2021-12-07 NOTE — Patient Instructions (Signed)
You will do great with surgery Happy Holidays See me in 8 weeks

## 2021-12-08 ENCOUNTER — Encounter (HOSPITAL_COMMUNITY): Payer: Self-pay

## 2021-12-08 ENCOUNTER — Other Ambulatory Visit: Payer: Self-pay

## 2021-12-08 ENCOUNTER — Encounter (HOSPITAL_COMMUNITY)
Admission: RE | Admit: 2021-12-08 | Discharge: 2021-12-08 | Disposition: A | Discharge status: Home / Self Care | Payer: Medicare PPO | Source: Ambulatory Visit | Attending: Surgery | Admitting: Surgery

## 2021-12-08 VITALS — BP 171/94 | HR 72 | Temp 98.0°F | Resp 18 | Ht 75.0 in | Wt 265.0 lb

## 2021-12-08 DIAGNOSIS — I10 Essential (primary) hypertension: Secondary | ICD-10-CM | POA: Diagnosis not present

## 2021-12-08 DIAGNOSIS — R739 Hyperglycemia, unspecified: Secondary | ICD-10-CM | POA: Diagnosis not present

## 2021-12-08 DIAGNOSIS — Z01818 Encounter for other preprocedural examination: Secondary | ICD-10-CM | POA: Diagnosis not present

## 2021-12-08 LAB — CBC
HCT: 41.5 % (ref 39.0–52.0)
Hemoglobin: 13.4 g/dL (ref 13.0–17.0)
MCH: 28.3 pg (ref 26.0–34.0)
MCHC: 32.3 g/dL (ref 30.0–36.0)
MCV: 87.6 fL (ref 80.0–100.0)
Platelets: 199 10*3/uL (ref 150–400)
RBC: 4.74 MIL/uL (ref 4.22–5.81)
RDW: 18.3 % — ABNORMAL HIGH (ref 11.5–15.5)
WBC: 5.4 10*3/uL (ref 4.0–10.5)
nRBC: 0 % (ref 0.0–0.2)

## 2021-12-08 LAB — BASIC METABOLIC PANEL
Anion gap: 5 (ref 5–15)
BUN: 25 mg/dL — ABNORMAL HIGH (ref 8–23)
CO2: 24 mmol/L (ref 22–32)
Calcium: 10 mg/dL (ref 8.9–10.3)
Chloride: 107 mmol/L (ref 98–111)
Creatinine, Ser: 1.28 mg/dL — ABNORMAL HIGH (ref 0.61–1.24)
GFR, Estimated: 58 mL/min — ABNORMAL LOW (ref >60)
Glucose, Bld: 118 mg/dL — ABNORMAL HIGH (ref 70–99)
Potassium: 4.4 mmol/L (ref 3.5–5.1)
Sodium: 136 mmol/L (ref 135–145)

## 2021-12-08 LAB — TYPE AND SCREEN
ABO/RH(D): O POS
Antibody Screen: NEGATIVE

## 2021-12-08 LAB — HEMOGLOBIN A1C
Hgb A1c MFr Bld: 5.2 % (ref 4.8–5.6)
Mean Plasma Glucose: 102.54 mg/dL

## 2021-12-08 NOTE — Progress Notes (Signed)
Anesthesia note:  Bowel prep reminder:  yes  PCP - Elissa Lovett DO Cardiologist -Dr. Corky Downs Other-   Chest x-ray - no EKG - 10/26/21-epic Stress Test - 07/12/21-epic ECHO - 07/24/21-epic Cardiac Cath - 2007 CABG-no Pacemaker/ICD device last checked:NA  Sleep Study - yes severe OSA CPAP - yes  Pt is pre diabetic-NA CBG at PAT visit- Fasting Blood Sugar at home- Checks Blood Sugar _____  Blood Thinner:Eliquis/A fib/ Dr/ Claiborne Billings Blood Thinner Instructions: hold for 2 days/Dr. Claiborne Billings Aspirin Instructions: Last Dose:12/1  Anesthesia review: Yes  reason:a fib  Patient denies shortness of breath, fever, cough and chest pain at PAT appointment Pt reports no SOB with activities. Pt has severe OSA uses c-pap for naps  Patient verbalized understanding of instructions that were given to them at the PAT appointment. Patient was also instructed that they will need to review over the PAT instructions again at home before surgery.yes

## 2021-12-15 NOTE — Anesthesia Preprocedure Evaluation (Addendum)
Anesthesia Evaluation  Patient identified by MRN, date of birth, ID band Patient awake    Reviewed: Allergy & Precautions, NPO status , Patient's Chart, lab work & pertinent test results  Airway Mallampati: II  TM Distance: >3 FB Neck ROM: Full    Dental no notable dental hx.    Pulmonary sleep apnea and Continuous Positive Airway Pressure Ventilation    Pulmonary exam normal        Cardiovascular hypertension, Pt. on medications and Pt. on home beta blockers + dysrhythmias Atrial Fibrillation  Rhythm:Irregular Rate:Normal  Echo 07/24/2021 1. Left ventricular ejection fraction, by estimation, is 50 to 55%. Left  ventricular ejection fraction by 2D MOD biplane is 54.2 %. The left  ventricle has low normal function. The left ventricle has no regional wall  motion abnormalities. There is mild  left ventricular hypertrophy. Left ventricular diastolic parameters are  consistent with Grade I diastolic dysfunction (impaired relaxation).   2. Reduced RV free wall strain at -19.4%. Right ventricular systolic  function is normal. The right ventricular size is normal. There is normal  pulmonary artery systolic pressure. The estimated right ventricular  systolic pressure is 83.3 mmHg.   3. Left atrial size was moderately dilated.   4. The mitral valve is abnormal. Trivial mitral valve regurgitation.   5. The aortic valve is tricuspid. Aortic valve regurgitation is not  visualized.   6. Aortic dilatation noted. There is mild dilatation of the aortic root,  measuring 43 mm. There is mild dilatation of the ascending aorta,  measuring 42 mm. There is moderate dilatation of the aortic arch,  measuring 45 mm.   7. The inferior vena cava is normal in size with greater than 50%  respiratory variability, suggesting right atrial pressure of 3 mmHg.    Neuro/Psych negative neurological ROS  negative psych ROS   GI/Hepatic Neg liver ROS,,,CRC    Endo/Other  negative endocrine ROS    Renal/GU   negative genitourinary   Musculoskeletal  (+) Arthritis , Osteoarthritis,    Abdominal Normal abdominal exam  (+)   Peds  Hematology negative hematology ROS (+)   Anesthesia Other Findings   Reproductive/Obstetrics                             Anesthesia Physical Anesthesia Plan  ASA: 3  Anesthesia Plan: General   Post-op Pain Management:    Induction: Intravenous  PONV Risk Score and Plan: 2 and Ondansetron, Dexamethasone and Treatment may vary due to age or medical condition  Airway Management Planned: Mask and Oral ETT  Additional Equipment: None  Intra-op Plan:   Post-operative Plan: Extubation in OR  Informed Consent: I have reviewed the patients History and Physical, chart, labs and discussed the procedure including the risks, benefits and alternatives for the proposed anesthesia with the patient or authorized representative who has indicated his/her understanding and acceptance.       Plan Discussed with: CRNA  Anesthesia Plan Comments: (See PAT not 12/08/2021  Lab Results      Component                Value               Date                      WBC  5.4                 12/08/2021                HGB                      13.4                12/08/2021                HCT                      41.5                12/08/2021                MCV                      87.6                12/08/2021                PLT                      199                 12/08/2021             Lab Results      Component                Value               Date                      NA                       136                 12/08/2021                K                        4.4                 12/08/2021                CO2                      24                  12/08/2021                GLUCOSE                  118 (H)             12/08/2021                BUN                       25 (H)              12/08/2021                CREATININE               1.28 (H)  12/08/2021                CALCIUM                  10.0                12/08/2021                EGFR                     54 (L)              07/08/2021                GFRNONAA                 58 (L)              12/08/2021           )       Anesthesia Quick Evaluation

## 2021-12-15 NOTE — Progress Notes (Signed)
Anesthesia Chart Review   Case: [id number] Date/Time: 12/21/21 1245   Procedure: LAPAROSCOPIC RIGHT HEMICOLECTOMY (Right)   Anesthesia type: General   Pre-op diagnosis: COLON CANCER   Location: [facility]   Surgeons: Ileana Roup, MD       DISCUSSION:76 y.o. never smoker with h/o sleep apnea on CPAP, HTN, atrial fibrillation, colon cancer scheduled for above procedure 12/21/2021 with Dr. Nadeen Landau.   Pt last seen by cardiology 12/02/2021. Per OV note, "Presently he is stable from a cardiac standpoint. He does not have any chest pain or shortness of breath. His most recent echo Doppler study from July 2023 shows EF at 50 to 55% (54.2% by biplane). There was mild LVH and mild grade 1 diastolic dysfunction. His left atrium was moderately dilated and there was mild dilatation of his aortic root measuring 43 mm, ascending aorta at 42 mm and aortic arch at 45 mm. His ECG today confirms sinus rhythm with previously noted right bundle branch block and left axis deviation. I am giving him clearance for him to undergo planned surgery which is tentatively scheduled for December 21, 2021 with Dr. Nadeen Landau. "  Pt advised to hold Eliquis 2 days prior to procedure.   Anticipate pt can proceed with planned procedure barring acute status change.    VS: BP (!) 171/94   Pulse 72   Temp 36.7 C (Oral)   Resp 18   Ht '6\' 3"'$  (1.905 m)   Wt 120.2 kg   SpO2 98%   BMI 33.12 kg/m   PROVIDERS: Kuneff, Renee A, DO is PCP   Shelva Majestic, MD is Cardiologist  LABS: Labs reviewed: Acceptable for surgery. (all labs ordered are listed, but only abnormal results are displayed)  Labs Reviewed  BASIC METABOLIC PANEL - Abnormal; Notable for the following components:      Result Value   Glucose, Bld 118 (*)    BUN 25 (*)    Creatinine, Ser 1.28 (*)    GFR, Estimated 58 (*)    All other components within normal limits  CBC - Abnormal; Notable for the following components:   RDW  18.3 (*)    All other components within normal limits  HEMOGLOBIN A1C  TYPE AND SCREEN     IMAGES:   EKG:   CV: Echo 07/24/2021 1. Left ventricular ejection fraction, by estimation, is 50 to 55%. Left  ventricular ejection fraction by 2D MOD biplane is 54.2 %. The left  ventricle has low normal function. The left ventricle has no regional wall  motion abnormalities. There is mild  left ventricular hypertrophy. Left ventricular diastolic parameters are  consistent with Grade I diastolic dysfunction (impaired relaxation).   2. Reduced RV free wall strain at -19.4%. Right ventricular systolic  function is normal. The right ventricular size is normal. There is normal  pulmonary artery systolic pressure. The estimated right ventricular  systolic pressure is 83.1 mmHg.   3. Left atrial size was moderately dilated.   4. The mitral valve is abnormal. Trivial mitral valve regurgitation.   5. The aortic valve is tricuspid. Aortic valve regurgitation is not  visualized.   6. Aortic dilatation noted. There is mild dilatation of the aortic root,  measuring 43 mm. There is mild dilatation of the ascending aorta,  measuring 42 mm. There is moderate dilatation of the aortic arch,  measuring 45 mm.   7. The inferior vena cava is normal in size with greater than 50%  respiratory  variability, suggesting right atrial pressure of 3 mmHg.  Myocardial Perfusion 07/13/2019 The left ventricular ejection fraction is mildly decreased (45-54%). Nuclear stress EF: 47%. Blood pressure demonstrated a hypertensive response to exercise. There was no ST segment deviation noted during stress. Defect 1: There is a large defect of severe severity present in the basal inferior, mid inferior and apical inferior location. Findings consistent with prior myocardial infarction with peri-infarct ischemia. This is a low risk study.   Abnormal, low risk stress nuclear study with prior inferior infarct and mild  peri-infarct ischemia.  Gated ejection fraction 47% with hypokinesis of the inferior basal wall.  Mild left ventricular enlargement.  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 Humphrey Rolls; CPAP titration 11/12/04- Dr. Humphrey Rolls    Past Surgical History:  Procedure Laterality Date   ATRIAL FIBRILLATION ABLATION N/A 09/10/2020   Procedure: ATRIAL FIBRILLATION ABLATION;  Surgeon: Constance Haw, MD;  Location: [place] [facility];  Service: Cardiovascular;  Laterality: N/A;   CARDIAC CATHETERIZATION  11/03/2005   CARDIOVERSION N/A 09/26/2018   Procedure: CARDIOVERSION;  Surgeon: Geralynn Rile, MD;  Location: [place];  Service: Endoscopy;  Laterality: N/A;   CARDIOVERSION N/A 03/13/2019   Procedure: CARDIOVERSION;  Surgeon: Skeet Latch, MD;  Location: [place];  Service: Cardiovascular;  Laterality: N/A;   CARDIOVERSION N/A 03/03/2021   Procedure: CARDIOVERSION;  Surgeon: Josue Hector, MD;  Location: [facility];  Service: Cardiovascular;  Laterality: N/A;   CATARACT EXTRACTION  01/19/2012   KNEE ARTHROSCOPY     left knee- 2002    TOTAL HIP ARTHROPLASTY  06/08/2011   Procedure: TOTAL HIP ARTHROPLASTY ANTERIOR APPROACH;  Surgeon: [name], MD;  Location: [facility];  Service: Orthopedics;  Laterality: Left;   TOTAL HIP ARTHROPLASTY Right 08/12/2020   Procedure: TOTAL HIP ARTHROPLASTY ANTERIOR APPROACH;  Surgeon: Paralee Cancel, MD;  Location: [facility];  Service: Orthopedics;  Laterality: Right;    MEDICATIONS:  amLODipine (NORVASC) 5 MG tablet    Coenzyme Q10 200 MG capsule   ELIQUIS 5 MG TABS tablet   ezetimibe (ZETIA) 10 MG tablet   ferrous sulfate 325 (65 FE) MG EC tablet   metFORMIN (GLUCOPHAGE) 500 MG tablet   metoprolol succinate (TOPROL-XL) 100 MG 24 hr tablet   Multiple Vitamins-Minerals (MULTIVITAMIN WITH MINERALS) tablet   olmesartan (BENICAR) 40 MG tablet   Pitavastatin Calcium (LIVALO) 2 MG TABS   spironolactone (ALDACTONE) 25 MG tablet   tamsulosin (FLOMAX) 0.4 MG CAPS capsule   triamcinolone cream (KENALOG) 0.5 %   No current facility-administered medications for this encounter.    Konrad Felix Ward, PA-C [facility] [phone]

## 2021-12-21 ENCOUNTER — Other Ambulatory Visit: Payer: Self-pay

## 2021-12-21 ENCOUNTER — Encounter (HOSPITAL_COMMUNITY)
Admission: RE | Disposition: A | Discharge status: Home / Self Care | Payer: Self-pay | Source: Home / Self Care | Attending: Surgery

## 2021-12-21 ENCOUNTER — Inpatient Hospital Stay (HOSPITAL_COMMUNITY): Payer: Medicare PPO | Admitting: Physician Assistant

## 2021-12-21 ENCOUNTER — Inpatient Hospital Stay (HOSPITAL_COMMUNITY)
Admission: RE | Admit: 2021-12-21 | Discharge: 2021-12-24 | DRG: 330 | Disposition: A | Discharge status: Home / Self Care | Payer: Medicare PPO | Attending: Surgery | Admitting: Surgery

## 2021-12-21 ENCOUNTER — Encounter (HOSPITAL_COMMUNITY): Payer: Self-pay | Admitting: Surgery

## 2021-12-21 ENCOUNTER — Inpatient Hospital Stay (HOSPITAL_COMMUNITY): Payer: Medicare PPO | Admitting: Certified Registered Nurse Anesthetist

## 2021-12-21 DIAGNOSIS — Z9989 Dependence on other enabling machines and devices: Secondary | ICD-10-CM | POA: Diagnosis not present

## 2021-12-21 DIAGNOSIS — D649 Anemia, unspecified: Secondary | ICD-10-CM | POA: Diagnosis present

## 2021-12-21 DIAGNOSIS — Z823 Family history of stroke: Secondary | ICD-10-CM | POA: Diagnosis not present

## 2021-12-21 DIAGNOSIS — K660 Peritoneal adhesions (postprocedural) (postinfection): Secondary | ICD-10-CM | POA: Diagnosis present

## 2021-12-21 DIAGNOSIS — Z7984 Long term (current) use of oral hypoglycemic drugs: Secondary | ICD-10-CM | POA: Diagnosis not present

## 2021-12-21 DIAGNOSIS — E669 Obesity, unspecified: Secondary | ICD-10-CM | POA: Diagnosis not present

## 2021-12-21 DIAGNOSIS — I4891 Unspecified atrial fibrillation: Secondary | ICD-10-CM | POA: Diagnosis not present

## 2021-12-21 DIAGNOSIS — C182 Malignant neoplasm of ascending colon: Secondary | ICD-10-CM | POA: Diagnosis not present

## 2021-12-21 DIAGNOSIS — Z7901 Long term (current) use of anticoagulants: Secondary | ICD-10-CM | POA: Diagnosis not present

## 2021-12-21 DIAGNOSIS — D122 Benign neoplasm of ascending colon: Secondary | ICD-10-CM | POA: Diagnosis not present

## 2021-12-21 DIAGNOSIS — Z8249 Family history of ischemic heart disease and other diseases of the circulatory system: Secondary | ICD-10-CM

## 2021-12-21 DIAGNOSIS — Z833 Family history of diabetes mellitus: Secondary | ICD-10-CM | POA: Diagnosis not present

## 2021-12-21 DIAGNOSIS — Z79899 Other long term (current) drug therapy: Secondary | ICD-10-CM

## 2021-12-21 DIAGNOSIS — D72829 Elevated white blood cell count, unspecified: Secondary | ICD-10-CM | POA: Diagnosis not present

## 2021-12-21 DIAGNOSIS — E785 Hyperlipidemia, unspecified: Secondary | ICD-10-CM | POA: Diagnosis not present

## 2021-12-21 DIAGNOSIS — N400 Enlarged prostate without lower urinary tract symptoms: Secondary | ICD-10-CM | POA: Diagnosis present

## 2021-12-21 DIAGNOSIS — M1990 Unspecified osteoarthritis, unspecified site: Secondary | ICD-10-CM | POA: Diagnosis present

## 2021-12-21 DIAGNOSIS — Z96643 Presence of artificial hip joint, bilateral: Secondary | ICD-10-CM | POA: Diagnosis present

## 2021-12-21 DIAGNOSIS — I130 Hypertensive heart and chronic kidney disease with heart failure and stage 1 through stage 4 chronic kidney disease, or unspecified chronic kidney disease: Secondary | ICD-10-CM | POA: Diagnosis present

## 2021-12-21 DIAGNOSIS — I10 Essential (primary) hypertension: Secondary | ICD-10-CM | POA: Diagnosis present

## 2021-12-21 DIAGNOSIS — I4819 Other persistent atrial fibrillation: Secondary | ICD-10-CM | POA: Diagnosis present

## 2021-12-21 DIAGNOSIS — Z8261 Family history of arthritis: Secondary | ICD-10-CM

## 2021-12-21 DIAGNOSIS — I5032 Chronic diastolic (congestive) heart failure: Secondary | ICD-10-CM | POA: Diagnosis not present

## 2021-12-21 DIAGNOSIS — Z8349 Family history of other endocrine, nutritional and metabolic diseases: Secondary | ICD-10-CM

## 2021-12-21 DIAGNOSIS — N182 Chronic kidney disease, stage 2 (mild): Secondary | ICD-10-CM | POA: Diagnosis present

## 2021-12-21 DIAGNOSIS — R7303 Prediabetes: Secondary | ICD-10-CM | POA: Diagnosis present

## 2021-12-21 DIAGNOSIS — Z9049 Acquired absence of other specified parts of digestive tract: Secondary | ICD-10-CM

## 2021-12-21 DIAGNOSIS — C189 Malignant neoplasm of colon, unspecified: Secondary | ICD-10-CM | POA: Diagnosis present

## 2021-12-21 DIAGNOSIS — G4733 Obstructive sleep apnea (adult) (pediatric): Secondary | ICD-10-CM | POA: Diagnosis not present

## 2021-12-21 DIAGNOSIS — Z800 Family history of malignant neoplasm of digestive organs: Secondary | ICD-10-CM

## 2021-12-21 DIAGNOSIS — Z6832 Body mass index (BMI) 32.0-32.9, adult: Secondary | ICD-10-CM

## 2021-12-21 DIAGNOSIS — I700 Atherosclerosis of aorta: Secondary | ICD-10-CM | POA: Diagnosis present

## 2021-12-21 DIAGNOSIS — Z6833 Body mass index (BMI) 33.0-33.9, adult: Secondary | ICD-10-CM | POA: Diagnosis not present

## 2021-12-21 DIAGNOSIS — D631 Anemia in chronic kidney disease: Secondary | ICD-10-CM | POA: Diagnosis present

## 2021-12-21 DIAGNOSIS — I480 Paroxysmal atrial fibrillation: Secondary | ICD-10-CM | POA: Diagnosis present

## 2021-12-21 HISTORY — DX: Chronic diastolic (congestive) heart failure: I50.32

## 2021-12-21 HISTORY — PX: LAPAROSCOPIC PARTIAL RIGHT COLECTOMY: SHX5913

## 2021-12-21 HISTORY — DX: Atherosclerosis of aorta: I70.0

## 2021-12-21 LAB — GLUCOSE, CAPILLARY
Glucose-Capillary: 141 mg/dL — ABNORMAL HIGH (ref 70–99)
Glucose-Capillary: 170 mg/dL — ABNORMAL HIGH (ref 70–99)

## 2021-12-21 SURGERY — LAPAROSCOPIC PARTIAL RIGHT COLECTOMY
Anesthesia: General | Laterality: Right

## 2021-12-21 MED ORDER — LACTATED RINGERS IV SOLN: INTRAVENOUS | Status: DC

## 2021-12-21 MED ORDER — FENTANYL CITRATE (PF) 250 MCG/5ML IJ SOLN
INTRAMUSCULAR | Status: DC | PRN
  Administered 2021-12-21: 50 ug via INTRAVENOUS
  Administered 2021-12-21 (×2): 100 ug via INTRAVENOUS

## 2021-12-21 MED ORDER — ACETAMINOPHEN 500 MG PO TABS
1000.0000 mg | ORAL_TABLET | Freq: Four times a day (QID) | ORAL | Status: DC
  Administered 2021-12-21 – 2021-12-24 (×10): 1000 mg via ORAL
  Filled 2021-12-21 (×12): qty 2

## 2021-12-21 MED ORDER — EPHEDRINE 5 MG/ML INJ
INTRAVENOUS | Status: AC
  Filled 2021-12-21: qty 5

## 2021-12-21 MED ORDER — SIMETHICONE 80 MG PO CHEW
40.0000 mg | CHEWABLE_TABLET | Freq: Four times a day (QID) | ORAL | Status: DC | PRN
  Administered 2021-12-21: 40 mg via ORAL
  Filled 2021-12-21: qty 1

## 2021-12-21 MED ORDER — SPIRONOLACTONE 12.5 MG HALF TABLET
12.5000 mg | ORAL_TABLET | Freq: Every day | ORAL | Status: DC
  Administered 2021-12-22 – 2021-12-24 (×3): 12.5 mg via ORAL
  Filled 2021-12-21 (×3): qty 1

## 2021-12-21 MED ORDER — HYDROMORPHONE HCL 1 MG/ML IJ SOLN
0.5000 mg | INTRAMUSCULAR | Status: DC | PRN
  Administered 2021-12-22: 0.5 mg via INTRAVENOUS
  Filled 2021-12-21: qty 0.5

## 2021-12-21 MED ORDER — BUPIVACAINE LIPOSOME 1.3 % IJ SUSP: 20.0000 mL | Freq: Once | INTRAMUSCULAR | Status: DC

## 2021-12-21 MED ORDER — ALBUMIN HUMAN 5 % IV SOLN
INTRAVENOUS | Status: AC
  Filled 2021-12-21: qty 250

## 2021-12-21 MED ORDER — ONDANSETRON HCL 4 MG PO TABS: 4.0000 mg | ORAL_TABLET | Freq: Four times a day (QID) | ORAL | Status: DC | PRN

## 2021-12-21 MED ORDER — AMLODIPINE BESYLATE 5 MG PO TABS
7.5000 mg | ORAL_TABLET | Freq: Every day | ORAL | Status: DC
  Administered 2021-12-22: 7.5 mg via ORAL
  Filled 2021-12-21: qty 2

## 2021-12-21 MED ORDER — DIPHENHYDRAMINE HCL 50 MG/ML IJ SOLN: 12.5000 mg | Freq: Four times a day (QID) | INTRAMUSCULAR | Status: DC | PRN

## 2021-12-21 MED ORDER — DIPHENHYDRAMINE HCL 12.5 MG/5ML PO ELIX: 12.5000 mg | ORAL_SOLUTION | Freq: Four times a day (QID) | ORAL | Status: DC | PRN

## 2021-12-21 MED ORDER — METOPROLOL SUCCINATE ER 50 MG PO TB24
100.0000 mg | ORAL_TABLET | Freq: Every day | ORAL | Status: DC
  Administered 2021-12-22 – 2021-12-24 (×3): 100 mg via ORAL
  Filled 2021-12-21 (×3): qty 2

## 2021-12-21 MED ORDER — TRAMADOL HCL 50 MG PO TABS
50.0000 mg | ORAL_TABLET | Freq: Four times a day (QID) | ORAL | Status: DC | PRN
  Administered 2021-12-21 – 2021-12-23 (×2): 50 mg via ORAL
  Filled 2021-12-21 (×2): qty 1

## 2021-12-21 MED ORDER — CHLORHEXIDINE GLUCONATE 0.12 % MT SOLN
15.0000 mL | Freq: Once | OROMUCOSAL | Status: AC
  Administered 2021-12-21: 15 mL via OROMUCOSAL

## 2021-12-21 MED ORDER — ALVIMOPAN 12 MG PO CAPS
12.0000 mg | ORAL_CAPSULE | Freq: Two times a day (BID) | ORAL | Status: DC
  Administered 2021-12-22: 12 mg via ORAL
  Filled 2021-12-21: qty 1

## 2021-12-21 MED ORDER — BUPIVACAINE LIPOSOME 1.3 % IJ SUSP
INTRAMUSCULAR | Status: DC | PRN
  Administered 2021-12-21: 20 mL

## 2021-12-21 MED ORDER — SUGAMMADEX SODIUM 500 MG/5ML IV SOLN
INTRAVENOUS | Status: AC
  Filled 2021-12-21: qty 5

## 2021-12-21 MED ORDER — POLYETHYLENE GLYCOL 3350 17 GM/SCOOP PO POWD: 1.0000 | Freq: Once | ORAL | Status: DC

## 2021-12-21 MED ORDER — PROPOFOL 10 MG/ML IV BOLUS
INTRAVENOUS | Status: DC | PRN
  Administered 2021-12-21: 20 mg via INTRAVENOUS
  Administered 2021-12-21: 180 mg via INTRAVENOUS

## 2021-12-21 MED ORDER — BISACODYL 5 MG PO TBEC: 20.0000 mg | DELAYED_RELEASE_TABLET | Freq: Once | ORAL | Status: DC

## 2021-12-21 MED ORDER — CHLORHEXIDINE GLUCONATE CLOTH 2 % EX PADS: 6.0000 | MEDICATED_PAD | Freq: Once | CUTANEOUS | Status: DC

## 2021-12-21 MED ORDER — BUPIVACAINE-EPINEPHRINE 0.5% -1:200000 IJ SOLN
INTRAMUSCULAR | Status: DC | PRN
  Administered 2021-12-21: 15 mL

## 2021-12-21 MED ORDER — ONDANSETRON HCL 4 MG/2ML IJ SOLN
INTRAMUSCULAR | Status: AC
  Filled 2021-12-21: qty 2

## 2021-12-21 MED ORDER — LIDOCAINE HCL (PF) 2 % IJ SOLN
INTRAMUSCULAR | Status: AC
  Filled 2021-12-21: qty 5

## 2021-12-21 MED ORDER — ORAL CARE MOUTH RINSE: 15.0000 mL | Freq: Once | OROMUCOSAL | Status: AC

## 2021-12-21 MED ORDER — STERILE WATER FOR IRRIGATION IR SOLN
Status: DC | PRN
  Administered 2021-12-21: 1000 mL

## 2021-12-21 MED ORDER — TAMSULOSIN HCL 0.4 MG PO CAPS
0.4000 mg | ORAL_CAPSULE | Freq: Every morning | ORAL | Status: DC
  Administered 2021-12-22 – 2021-12-24 (×3): 0.4 mg via ORAL
  Filled 2021-12-21 (×3): qty 1

## 2021-12-21 MED ORDER — KETAMINE HCL 10 MG/ML IJ SOLN
INTRAMUSCULAR | Status: DC | PRN
  Administered 2021-12-21: 10 mg via INTRAVENOUS
  Administered 2021-12-21: 20 mg via INTRAVENOUS

## 2021-12-21 MED ORDER — FENTANYL CITRATE (PF) 250 MCG/5ML IJ SOLN
INTRAMUSCULAR | Status: AC
  Filled 2021-12-21: qty 5

## 2021-12-21 MED ORDER — BUPIVACAINE-EPINEPHRINE (PF) 0.5% -1:200000 IJ SOLN
INTRAMUSCULAR | Status: AC
  Filled 2021-12-21: qty 30

## 2021-12-21 MED ORDER — METRONIDAZOLE 500 MG PO TABS: 1000.0000 mg | ORAL_TABLET | ORAL | Status: DC

## 2021-12-21 MED ORDER — ONDANSETRON HCL 4 MG/2ML IJ SOLN
INTRAMUSCULAR | Status: DC | PRN
  Administered 2021-12-21: 4 mg via INTRAVENOUS

## 2021-12-21 MED ORDER — SODIUM CHLORIDE (PF) 0.9 % IJ SOLN
INTRAMUSCULAR | Status: DC | PRN
  Administered 2021-12-21: 15 mL

## 2021-12-21 MED ORDER — NEOMYCIN SULFATE 500 MG PO TABS: 1000.0000 mg | ORAL_TABLET | ORAL | Status: DC

## 2021-12-21 MED ORDER — ENSURE PRE-SURGERY PO LIQD: 592.0000 mL | Freq: Once | ORAL | Status: DC

## 2021-12-21 MED ORDER — LACTATED RINGERS IV SOLN: INTRAVENOUS | Status: DC | PRN

## 2021-12-21 MED ORDER — TRIAMCINOLONE ACETONIDE 0.5 % EX CREA: 1.0000 | TOPICAL_CREAM | Freq: Every day | CUTANEOUS | Status: DC | PRN

## 2021-12-21 MED ORDER — ROCURONIUM BROMIDE 10 MG/ML (PF) SYRINGE
PREFILLED_SYRINGE | INTRAVENOUS | Status: DC | PRN
  Administered 2021-12-21: 20 mg via INTRAVENOUS
  Administered 2021-12-21: 60 mg via INTRAVENOUS
  Administered 2021-12-21: 20 mg via INTRAVENOUS

## 2021-12-21 MED ORDER — ENSURE SURGERY PO LIQD
237.0000 mL | Freq: Two times a day (BID) | ORAL | Status: DC
  Administered 2021-12-22 – 2021-12-24 (×3): 237 mL via ORAL

## 2021-12-21 MED ORDER — HEPARIN SODIUM (PORCINE) 5000 UNIT/ML IJ SOLN
5000.0000 [IU] | Freq: Three times a day (TID) | INTRAMUSCULAR | Status: DC
  Administered 2021-12-21 – 2021-12-23 (×5): 5000 [IU] via SUBCUTANEOUS
  Filled 2021-12-21 (×5): qty 1

## 2021-12-21 MED ORDER — HEPARIN SODIUM (PORCINE) 5000 UNIT/ML IJ SOLN
5000.0000 [IU] | Freq: Once | INTRAMUSCULAR | Status: AC
  Administered 2021-12-21: 5000 [IU] via SUBCUTANEOUS
  Filled 2021-12-21: qty 1

## 2021-12-21 MED ORDER — ENSURE PRE-SURGERY PO LIQD: 296.0000 mL | Freq: Once | ORAL | Status: DC

## 2021-12-21 MED ORDER — ALVIMOPAN 12 MG PO CAPS
12.0000 mg | ORAL_CAPSULE | ORAL | Status: AC
  Administered 2021-12-21: 12 mg via ORAL
  Filled 2021-12-21: qty 1

## 2021-12-21 MED ORDER — EPHEDRINE SULFATE-NACL 50-0.9 MG/10ML-% IV SOSY
PREFILLED_SYRINGE | INTRAVENOUS | Status: DC | PRN
  Administered 2021-12-21: 5 mg via INTRAVENOUS
  Administered 2021-12-21: 10 mg via INTRAVENOUS
  Administered 2021-12-21: 5 mg via INTRAVENOUS
  Administered 2021-12-21: 10 mg via INTRAVENOUS
  Administered 2021-12-21 (×2): 5 mg via INTRAVENOUS
  Administered 2021-12-21: 10 mg via INTRAVENOUS

## 2021-12-21 MED ORDER — PROPOFOL 10 MG/ML IV BOLUS
INTRAVENOUS | Status: AC
  Filled 2021-12-21: qty 20

## 2021-12-21 MED ORDER — DEXAMETHASONE SODIUM PHOSPHATE 10 MG/ML IJ SOLN
INTRAMUSCULAR | Status: AC
  Filled 2021-12-21: qty 1

## 2021-12-21 MED ORDER — IRBESARTAN 150 MG PO TABS
300.0000 mg | ORAL_TABLET | Freq: Every day | ORAL | Status: DC
  Administered 2021-12-22: 300 mg via ORAL
  Filled 2021-12-21: qty 2

## 2021-12-21 MED ORDER — LIDOCAINE 2% (20 MG/ML) 5 ML SYRINGE
INTRAMUSCULAR | Status: DC | PRN
  Administered 2021-12-21: 1.5 mg/kg/h via INTRAVENOUS
  Administered 2021-12-21: 80 mg via INTRAVENOUS

## 2021-12-21 MED ORDER — ALUM & MAG HYDROXIDE-SIMETH 200-200-20 MG/5ML PO SUSP: 30.0000 mL | Freq: Four times a day (QID) | ORAL | Status: DC | PRN

## 2021-12-21 MED ORDER — EZETIMIBE 10 MG PO TABS
10.0000 mg | ORAL_TABLET | Freq: Every morning | ORAL | Status: DC
  Administered 2021-12-22 – 2021-12-24 (×3): 10 mg via ORAL
  Filled 2021-12-21 (×3): qty 1

## 2021-12-21 MED ORDER — HYDRALAZINE HCL 20 MG/ML IJ SOLN: 10.0000 mg | INTRAMUSCULAR | Status: DC | PRN

## 2021-12-21 MED ORDER — PHENYLEPHRINE HCL-NACL 20-0.9 MG/250ML-% IV SOLN
INTRAVENOUS | Status: DC | PRN
  Administered 2021-12-21: 35 ug/min via INTRAVENOUS

## 2021-12-21 MED ORDER — ALBUMIN HUMAN 5 % IV SOLN: INTRAVENOUS | Status: DC | PRN

## 2021-12-21 MED ORDER — DEXAMETHASONE SODIUM PHOSPHATE 10 MG/ML IJ SOLN
INTRAMUSCULAR | Status: DC | PRN
  Administered 2021-12-21: 10 mg via INTRAVENOUS

## 2021-12-21 MED ORDER — INSULIN ASPART 100 UNIT/ML IJ SOLN: 0.0000 [IU] | Freq: Every day | INTRAMUSCULAR | Status: DC

## 2021-12-21 MED ORDER — ROCURONIUM BROMIDE 10 MG/ML (PF) SYRINGE
PREFILLED_SYRINGE | INTRAVENOUS | Status: AC
  Filled 2021-12-21: qty 10

## 2021-12-21 MED ORDER — FERROUS SULFATE 325 (65 FE) MG PO TABS
325.0000 mg | ORAL_TABLET | Freq: Two times a day (BID) | ORAL | Status: DC
  Administered 2021-12-22 – 2021-12-24 (×5): 325 mg via ORAL
  Filled 2021-12-21 (×5): qty 1

## 2021-12-21 MED ORDER — SUGAMMADEX SODIUM 500 MG/5ML IV SOLN
INTRAVENOUS | Status: DC | PRN
  Administered 2021-12-21: 250 mg via INTRAVENOUS

## 2021-12-21 MED ORDER — BUPIVACAINE LIPOSOME 1.3 % IJ SUSP
INTRAMUSCULAR | Status: AC
  Filled 2021-12-21: qty 20

## 2021-12-21 MED ORDER — ACETAMINOPHEN 500 MG PO TABS
1000.0000 mg | ORAL_TABLET | ORAL | Status: AC
  Administered 2021-12-21: 1000 mg via ORAL
  Filled 2021-12-21: qty 2

## 2021-12-21 MED ORDER — INSULIN ASPART 100 UNIT/ML IJ SOLN
0.0000 [IU] | Freq: Three times a day (TID) | INTRAMUSCULAR | Status: DC
  Administered 2021-12-22 – 2021-12-23 (×4): 2 [IU] via SUBCUTANEOUS

## 2021-12-21 MED ORDER — FENTANYL CITRATE PF 50 MCG/ML IJ SOSY: 25.0000 ug | PREFILLED_SYRINGE | INTRAMUSCULAR | Status: DC | PRN

## 2021-12-21 MED ORDER — SODIUM CHLORIDE 0.9 % IV SOLN
2.0000 g | INTRAVENOUS | Status: AC
  Administered 2021-12-21: 2 g via INTRAVENOUS
  Filled 2021-12-21: qty 2

## 2021-12-21 MED ORDER — ONDANSETRON HCL 4 MG/2ML IJ SOLN
4.0000 mg | Freq: Four times a day (QID) | INTRAMUSCULAR | Status: DC | PRN
  Administered 2021-12-23: 4 mg via INTRAVENOUS
  Filled 2021-12-21: qty 2

## 2021-12-21 MED ORDER — 0.9 % SODIUM CHLORIDE (POUR BTL) OPTIME
TOPICAL | Status: DC | PRN
  Administered 2021-12-21: 1000 mL

## 2021-12-21 SURGICAL SUPPLY — 82 items
ADH SKN CLS APL DERMABOND .7 (GAUZE/BANDAGES/DRESSINGS) ×1
APPLIER CLIP 5 13 M/L LIGAMAX5 (MISCELLANEOUS)
APPLIER CLIP ROT 10 11.4 M/L (STAPLE)
APR CLP MED LRG 11.4X10 (STAPLE)
APR CLP MED LRG 5 ANG JAW (MISCELLANEOUS)
BAG COUNTER SPONGE SURGICOUNT (BAG) IMPLANT
BAG SPNG CNTER NS LX DISP (BAG)
BLADE EXTENDED COATED 6.5IN (ELECTRODE) IMPLANT
CABLE HIGH FREQUENCY MONO STRZ (ELECTRODE) IMPLANT
CATH MUSHROOM 28FR (CATHETERS) IMPLANT
CATH MUSHROOM 30FR (CATHETERS) IMPLANT
CELLS DAT CNTRL 66122 CELL SVR (MISCELLANEOUS) IMPLANT
CLIP APPLIE 5 13 M/L LIGAMAX5 (MISCELLANEOUS) IMPLANT
CLIP APPLIE ROT 10 11.4 M/L (STAPLE) IMPLANT
DERMABOND ADVANCED .7 DNX12 (GAUZE/BANDAGES/DRESSINGS) IMPLANT
DISSECTOR BLUNT TIP ENDO 5MM (MISCELLANEOUS) IMPLANT
DRAIN CHANNEL 19F RND (DRAIN) IMPLANT
DRAPE SURG IRRIG POUCH 19X23 (DRAPES) ×1 IMPLANT
DRSG OPSITE POSTOP 4X10 (GAUZE/BANDAGES/DRESSINGS) IMPLANT
DRSG OPSITE POSTOP 4X6 (GAUZE/BANDAGES/DRESSINGS) IMPLANT
DRSG OPSITE POSTOP 4X8 (GAUZE/BANDAGES/DRESSINGS) IMPLANT
ELECT REM PT RETURN 15FT ADLT (MISCELLANEOUS) ×1 IMPLANT
EVACUATOR SILICONE 100CC (DRAIN) IMPLANT
GAUZE SPONGE 4X4 12PLY STRL (GAUZE/BANDAGES/DRESSINGS) IMPLANT
GLOVE BIO SURGEON STRL SZ7.5 (GLOVE) ×2 IMPLANT
GLOVE INDICATOR 8.0 STRL GRN (GLOVE) ×2 IMPLANT
GOWN STRL REUS W/ TWL XL LVL3 (GOWN DISPOSABLE) ×4 IMPLANT
GOWN STRL REUS W/TWL XL LVL3 (GOWN DISPOSABLE) ×4
HOLDER FOLEY CATH W/STRAP (MISCELLANEOUS) ×1 IMPLANT
IRRIG SUCT STRYKERFLOW 2 WTIP (MISCELLANEOUS) ×1
IRRIGATION SUCT STRKRFLW 2 WTP (MISCELLANEOUS) ×1 IMPLANT
KIT TURNOVER KIT A (KITS) IMPLANT
LIGASURE IMPACT 36 18CM CVD LR (INSTRUMENTS) IMPLANT
NDL INSUFFLATION 14GA 120MM (NEEDLE) IMPLANT
NEEDLE INSUFFLATION 14GA 120MM (NEEDLE) IMPLANT
PACK COLON (CUSTOM PROCEDURE TRAY) ×1 IMPLANT
PAD POSITIONING PINK XL (MISCELLANEOUS) ×1 IMPLANT
PENCIL SMOKE EVACUATOR (MISCELLANEOUS) IMPLANT
RELOAD PROXIMATE 75MM BLUE (ENDOMECHANICALS) ×2 IMPLANT
RELOAD STAPLE 75 3.8 BLU REG (ENDOMECHANICALS) IMPLANT
RETRACTOR WND ALEXIS 18 MED (MISCELLANEOUS) IMPLANT
RTRCTR WOUND ALEXIS 18CM MED (MISCELLANEOUS)
SCISSORS LAP 5X35 DISP (ENDOMECHANICALS) ×1 IMPLANT
SEALER TISSUE G2 STRG ARTC 35C (ENDOMECHANICALS) ×1 IMPLANT
SET TUBE SMOKE EVAC HIGH FLOW (TUBING) ×1 IMPLANT
SLEEVE ADV FIXATION 5X100MM (TROCAR) ×2 IMPLANT
SLEEVE Z-THREAD 12X100MM (TROCAR) IMPLANT
SPIKE FLUID TRANSFER (MISCELLANEOUS) ×1 IMPLANT
SPONGE DRAIN TRACH 4X4 STRL 2S (GAUZE/BANDAGES/DRESSINGS) IMPLANT
STAPLER 90 3.5 STAND SLIM (STAPLE) ×1
STAPLER 90 3.5 STD SLIM (STAPLE) IMPLANT
STAPLER GUN LINEAR PROX 60 (STAPLE) IMPLANT
STAPLER PROXIMATE 75MM BLUE (STAPLE) IMPLANT
STAPLER VISISTAT 35W (STAPLE) IMPLANT
SUT ETHILON 3 0 PS 1 (SUTURE) IMPLANT
SUT MNCRL AB 4-0 PS2 18 (SUTURE) IMPLANT
SUT PDS AB 1 TP1 54 (SUTURE) IMPLANT
SUT PDS AB 1 TP1 96 (SUTURE) IMPLANT
SUT PROLENE 2 0 KS (SUTURE) ×1 IMPLANT
SUT PROLENE 2 0 SH DA (SUTURE) ×1 IMPLANT
SUT SILK 2 0 (SUTURE)
SUT SILK 2 0 SH CR/8 (SUTURE) ×1 IMPLANT
SUT SILK 2-0 18XBRD TIE 12 (SUTURE) ×1 IMPLANT
SUT SILK 3 0 (SUTURE)
SUT SILK 3 0 SH CR/8 (SUTURE) ×1 IMPLANT
SUT SILK 3-0 18XBRD TIE 12 (SUTURE) ×1 IMPLANT
SUT VIC AB 2-0 SH 27 (SUTURE) ×1
SUT VIC AB 2-0 SH 27X BRD (SUTURE) IMPLANT
SUT VIC AB 3-0 SH 18 (SUTURE) IMPLANT
SUT VIC AB 3-0 SH 27 (SUTURE) ×1
SUT VIC AB 3-0 SH 27X BRD (SUTURE) IMPLANT
SUT VICRYL 2 0 18  UND BR (SUTURE)
SUT VICRYL 2 0 18 UND BR (SUTURE) ×1 IMPLANT
SYS LAPSCP GELPORT 120MM (MISCELLANEOUS)
SYS WOUND ALEXIS 18CM MED (MISCELLANEOUS)
SYSTEM LAPSCP GELPORT 120MM (MISCELLANEOUS) IMPLANT
SYSTEM WOUND ALEXIS 18CM MED (MISCELLANEOUS) IMPLANT
TOWEL OR 17X26 10 PK STRL BLUE (TOWEL DISPOSABLE) IMPLANT
TOWEL OR NON WOVEN STRL DISP B (DISPOSABLE) ×1 IMPLANT
TRAY IRRIG W/60CC SYR STRL (SET/KITS/TRAYS/PACK) ×1 IMPLANT
TROCAR ADV FIXATION 5X100MM (TROCAR) ×1 IMPLANT
TROCAR BALLN 12MMX100 BLUNT (TROCAR) IMPLANT

## 2021-12-21 NOTE — Consult Note (Addendum)
Initial Consultation Note   Patient: Clayton [name] MEQ:[id number] DOB: Jun 13, 1945 PCP: Ma Hillock, DO DOA: 12/21/2021 DOS: the patient was seen and examined on 12/21/2021 Primary service: Ileana Roup, MD  Referring physician: Nadeen Landau MD. Reason for consult: Medical management.  Assessment and Plan: Principal Problem:   Adenocarcinoma, colon ([place])   S/P right hemicolectomy Continue analgesics as needed.  Continue antiemetics as needed. Postop care per surgical team.  Active Problems:   OSA on CPAP Defer postop reinitiation on CPAP to primary team..    Hyperlipidemia Aortic atherosclerosis (HCC) Continue ezetimibe 10 mg p.o. daily. Continue pitavastatin 2 mg in the evening.    BMI 33.0-33.9,adult Has made significant progress. Continue lifestyle modifications. Follow-up primary care provider.    Prediabetes Postop diet per surgery. Continue metformin 500 mg p.o. twice daily. CBG monitoring with RI sliding scale while in the hospital.    Paroxysmal atrial fibrillation (HCC) CHA2DS2-VASc Score of at least 6. Continue holding apixaban. Resume anticoagulation per general surgery. Continue metoprolol for rate control.    Primary hypertension Continue amlodipine 7.5 mg p.o. daily. Continue metoprolol succinate 50 mg p.o. daily.   Continue olmesartan 40 mg daily or formulary equivalent. Continue spironolactone 12.5 mg p.o. daily.    Chronic diastolic heart failure (HCC)  No signs of decompensation. Continue beta-blocker, ARB and diuretic as above. Follow-up with cardiology as scheduled as an outpatient.    Normocytic anemia Monitor hematocrit and hemoglobin. Transfuse as needed.    TR[facility] will continue to follow the patient.  HPI: Clayton [name] is a 76 y.o. male with past medical history of hypertension, hyperlipidemia, BPH, OSA, persistent atrial fibrillation, prediabetes, class I obesity with a BMI of 33.12 kg/m, osteoarthritis of  multiple sites, mildly impaired renal function who underwent a colonoscopy and diagnosed with a right ascending colon mass by Dr. Loletha Carrow on 10/21/2021, subsequently referred to Dr. Nadeen Landau and seen at the office on 11/05/2021, seen preoperatively by his cardiologist Dr. Shelva Majestic on 12/02/2021 who underwent had right-sided laparoscopic hemicolectomy uneventfully with Dr. Nadeen Landau today and we have been consulted to assist with medical management postoperatively.  He was evaluated while still in PACU.  He denied pain, chills, nausea, emesis, dyspnea, palpitations or chest pains.  No recent headache, sore throat, cough, wheezing or hemoptysis.  No PND, orthopnea or recent pitting edema of the lower extremities.  No dysuria, frequency or hematuria.  No polyuria, polydipsia, polyphagia or blurred vision.  Review of Systems: As mentioned in the history of present illness. All other systems reviewed and are negative.  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 Humphrey Rolls; CPAP titration 11/12/04- Dr. Humphrey Rolls   Past Surgical History:  Procedure Laterality Date   ATRIAL FIBRILLATION ABLATION N/A 09/10/2020   Procedure: ATRIAL FIBRILLATION ABLATION;  Surgeon: Constance Haw, MD;  Location: [place] [facility];  Service: Cardiovascular;  Laterality: N/A;   CARDIAC CATHETERIZATION  11/03/2005   CARDIOVERSION N/A  09/26/2018   Procedure: CARDIOVERSION;  Surgeon: Geralynn Rile, MD;  Location: [place];  Service: Endoscopy;  Laterality: N/A;   CARDIOVERSION  N/A 03/13/2019   Procedure: CARDIOVERSION;  Surgeon: Skeet Latch, MD;  Location: [place];  Service: Cardiovascular;  Laterality: N/A;   CARDIOVERSION N/A 03/03/2021   Procedure: CARDIOVERSION;  Surgeon: Josue Hector, MD;  Location: [facility];  Service: Cardiovascular;  Laterality: N/A;   CATARACT EXTRACTION  01/19/2012   KNEE ARTHROSCOPY     left knee- 2002    TOTAL HIP ARTHROPLASTY  06/08/2011   Procedure: TOTAL HIP ARTHROPLASTY ANTERIOR APPROACH;  Surgeon: [name], MD;  Location[facility];  Service: Orthopedics;  Laterality: Left;   TOTAL HIP ARTHROPLASTY Right 08/12/2020   Procedure: TOTAL HIP ARTHROPLASTY ANTERIOR APPROACH;  Surgeon: Paralee Cancel, MD;  Location: [facility];  Service: Orthopedics;  Laterality: Right;   Social History:  reports that he has never smoked. He has never used smokeless tobacco. He reports that he does not drink alcohol and does not use drugs.  No Known Allergies  Family History  Problem Relation Age of Onset   Hypertension Mother    Stroke Mother    Diabetes Mother    Hyperlipidemia Mother    Obesity Mother    Arthritis Father    Heart disease Father    Diabetes Sister    Stroke Maternal Grandmother        40 died    Heart disease Maternal Grandfather    Hypertension Maternal Grandfather    Diabetes Maternal Grandfather    Heart disease Paternal Grandfather     Prior to Admission medications   Medication Sig Start Date End Date Taking? Authorizing Provider  amLODipine (NORVASC) 5 MG tablet Take 1.5 tablets (7.5 mg total) by mouth daily. 10/26/21  Yes [name], MD  Coenzyme Q10 200 MG capsule Take 200 mg by mouth in the morning.   Yes [provider]  ELIQUIS 5 MG TABS tablet TAKE 1 TABLET BY MOUTH TWICE A DAY 10/09/21  Yes Tillery, Satira Mccallum, PA-C  ezetimibe (ZETIA) 10 MG tablet Take 1 tablet (10 mg total) by mouth in the morning. 07/15/21  Yes Camnitz, Ocie Doyne, MD  ferrous sulfate 325 (65 FE) MG EC tablet  Take 325 mg by mouth in the morning and at bedtime.   Yes [provider]  metFORMIN (GLUCOPHAGE) 500 MG tablet Take 1 tablet (500 mg total) by mouth daily with breakfast. 08/07/21  Yes Kuneff, Renee A, DO  metoprolol succinate (TOPROL-XL) 100 MG 24 hr tablet Take 1 tablet (100 mg total) by mouth daily. Patient taking differently: Take 50 mg by mouth in the morning and at bedtime. 07/15/21  Yes Camnitz, Will Hassell Done, MD  Multiple Vitamins-Minerals (MULTIVITAMIN WITH MINERALS) tablet Take 1 tablet by mouth daily.   Yes [provider]  olmesartan (BENICAR) 40 MG tablet TAKE 1 TABLET BY MOUTH EVERY DAY 04/14/21  Yes [name], MD  Pitavastatin Calcium (LIVALO) 2 MG TABS Take 1 tablet (2 mg total) by mouth every evening. 07/15/21  Yes Camnitz, Ocie Doyne, MD  spironolactone (ALDACTONE) 25 MG tablet Take 1 tablet (25 mg) alternating every other day with 0.5 tablet (12.5 mg) Patient taking differently: Take 12.5 mg by mouth daily. 12/02/21  Yes [name], MD  tamsulosin (FLOMAX) 0.4 MG CAPS capsule Take 0.4 mg by mouth in the morning. 02/28/19  Yes [provider]  triamcinolone cream (KENALOG) 0.5 % APPLY 1 APPLICATION TOPICALLY  2 (TWO) TIMES DAILY. TO AFFECTED AREAS. Patient taking differently: Apply 1 application  topically daily as needed (rash). 04/27/21  Yes [name], DO    Physical Exam: Vitals:   12/21/21 1035 12/21/21 1106 12/21/21 1538  BP:  (!) 143/74 (!) 150/82  Pulse:  85 73  Resp:  18 19  Temp:  99.1 F (37.3 C) (!) 97.4 F (36.3 C)  TempSrc:  Oral   SpO2:  96% 97%  Weight: 120.2 kg    Height: '6\' 3"'$  (1.905 m)     Physical Exam Vitals and nursing note reviewed.  Constitutional:      Appearance: Normal appearance.  HENT:     Head: Normocephalic.     Mouth/Throat:     Mouth: Mucous membranes are dry.  Eyes:     General: No scleral icterus.    Pupils: Pupils are equal, round, and reactive to light.  Neck:     Vascular: No JVD.   Cardiovascular:     Rate and Rhythm: Normal rate and regular rhythm.     Heart sounds: S1 normal and S2 normal.  Pulmonary:     Effort: Pulmonary effort is normal.     Breath sounds: No wheezing, rhonchi or rales.  Abdominal:     General: There is no distension.     Palpations: Abdomen is soft.     Tenderness: There is no abdominal tenderness.  Musculoskeletal:     Cervical back: Neck supple.     Right lower leg: No edema.     Left lower leg: No edema.  Skin:    General: Skin is warm and dry.  Neurological:     General: No focal deficit present.     Mental Status: He is alert and oriented to person, place, and time.  Psychiatric:        Mood and Affect: Mood normal.        Behavior: Behavior normal.   Data Reviewed:   There are no new results to review at this time.   07/24/2021 echocardiogram: IMPRESSIONS    1. Left ventricular ejection fraction, by estimation, is 50 to 55%. Left  ventricular ejection fraction by 2D MOD biplane is 54.2 %. The left  ventricle has low normal function. The left ventricle has no regional wall  motion abnormalities. There is mild  left ventricular hypertrophy. Left ventricular diastolic parameters are  consistent with Grade I diastolic dysfunction (impaired relaxation).   2. Reduced RV free wall strain at -19.4%. Right ventricular systolic  function is normal. The right ventricular size is normal. There is normal  pulmonary artery systolic pressure. The estimated right ventricular  systolic pressure is 72.0 mmHg.   3. Left atrial size was moderately dilated.   4. The mitral valve is abnormal. Trivial mitral valve regurgitation.   5. The aortic valve is tricuspid. Aortic valve regurgitation is not  visualized.   6. Aortic dilatation noted. There is mild dilatation of the aortic root,  measuring 43 mm. There is mild dilatation of the ascending aorta,  measuring 42 mm. There is moderate dilatation of the aortic arch,  measuring 45 mm.   7.  The inferior vena cava is normal in size with greater than 50%  respiratory variability, suggesting right atrial pressure of 3 mmHg.   Family Communication:  Primary team communication: Spoke to Dr. Nadeen Landau. Thank you very much for involving [name] in the care of your patient.  Author: Reubin Milan, MD 12/21/2021 3:49 PM  For  on call review [email or web address].   This document was prepared using Dragon voice recognition software and may contain some unintended transcription errors.

## 2021-12-21 NOTE — Transfer of Care (Signed)
Immediate Anesthesia Transfer of Care Note  Patient: MALIQ PILLEY  Procedure(s) Performed: LAPAROSCOPIC RIGHT HEMICOLECTOMY (Right)  Patient Location: PACU  Anesthesia Type:General  Level of Consciousness: awake and alert   Airway & Oxygen Therapy: Patient Spontanous Breathing and Patient connected to face mask oxygen  Post-op Assessment: Report given to RN and Post -op Vital signs reviewed and stable  Post vital signs: Reviewed and stable  Last Vitals:  Vitals Value Taken Time  BP 150/82 12/21/21 1538  Temp    Pulse 73 12/21/21 1542  Resp 23 12/21/21 1542  SpO2 93 % 12/21/21 1542  Vitals shown include unvalidated device data.  Last Pain:  Vitals:   12/21/21 1106  TempSrc: Oral         Complications: No notable events documented.

## 2021-12-21 NOTE — Anesthesia Procedure Notes (Signed)
Procedure Name: Intubation Date/Time: 12/21/2021 1:17 PM  Performed by: Maxwell Caul, CRNAPre-anesthesia Checklist: Patient identified, Emergency Drugs available, Suction available and Patient being monitored Patient Re-evaluated:Patient Re-evaluated prior to induction Oxygen Delivery Method: Circle system utilized Preoxygenation: Pre-oxygenation with 100% oxygen Induction Type: IV induction Ventilation: Mask ventilation without difficulty Laryngoscope Size: Mac and 4 Grade View: Grade I Tube type: Oral Tube size: 7.5 mm Number of attempts: 1 Airway Equipment and Method: Stylet and Oral airway Placement Confirmation: ETT inserted through vocal cords under direct vision, positive ETCO2 and breath sounds checked- equal and bilateral Secured at: 21 cm Tube secured with: Tape Dental Injury: Teeth and Oropharynx as per pre-operative assessment

## 2021-12-21 NOTE — H&P (Signed)
CC: Here today for surgery  HPI: Clayton [name] is an 76 y.o. male with history of HTN, HLD, BPH, OSA, afib (on Eliquis), whom was seen in the office 11/05/21 as a referral by Dr. Loletha Carrow for evaluation of colon cancer.  He underwent colonoscopy with Dr. Loletha Carrow 10/21/2021: - Rule out malignancy, mass in the mid ascending colon. Biopsied. Tattooed. - Three 3 to 6 mm polyps in the distal transverse colon and at the hepatic flexure, removed with a cold snare. Resected and retrieved. - One 12-15 mm polyp in the descending colon, removed with a hot snare. Resected and retrieved. - Diverticulosis in the left colon. - Internal hemorrhoids. - The examination was otherwise normal on direct and retroflexion views.  PATH -  Ascending colon polyp biopsy-invasive adenocarcinoma, moderately differentiated -Other polyps were all tubular adenomas without high-grade dysplasia.  CT CAP 10/29/21 -  1. The ascending colonic primary is equivocally identified as detailed above. No dominant or obstructive mass seen. 2. No findings of metastatic disease in the chest, abdomen, or pelvis. 3. Probable cholelithiasis 4. Coronary artery atherosclerosis. Aortic Atherosclerosis (ICD10-I70.0). 5. Degraded evaluation of the pelvis, secondary to beam hardening artifact from bilateral hip arthroplasty.  He was referred to see [name]. His colonoscopy was done for the purposes of a positive Cologuard.  INTERVAL HX He denies any changes in his health or health history since we met in the office. Tolerated bowel prep with satisfactory result. Off Eliquis since last Thursday. States he is ready for surgery.  PMH: HTN, HLD, BPH, OSA, afib (on Eliquis)  PSH: Hip surgery (Dr. [name]); no prior abdominal or pelvic surgery  FHx: Aunt whom had colon cancer. Denies any other known family history of colorectal, breast, endometrial or ovarian cancer  Social Hx: Denies use of tobacco/EtOH/illicit drug. He is here today with his  wife. 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   Past Medical History:  Diagnosis Date   Arthritis    hips    Atrial fibrillation (HCC)    Cataract    Cochlear Meniere syndrome of left ear 06/09/2015   CPAP (continuous positive airway pressure) dependence    Dysrhythmia    PAF- hx of 7 years ago    HLD (hyperlipidemia)    Hypertension 05/29/2010   ECHO-EF 67% wnl   Meniere disease    Obesity    Palpitations    S/P right total hip arthroplasty 08/12/2020   Sleep apnea 09/14/2004   [facility]- Dr. Humphrey Rolls; CPAP titration 11/12/04- Dr. Humphrey Rolls    Past Surgical History:  Procedure Laterality Date   ATRIAL FIBRILLATION ABLATION N/A 09/10/2020   Procedure: ATRIAL FIBRILLATION ABLATION;  Surgeon: Constance Haw, MD;  Location: [facility];  Service: Cardiovascular;  Laterality: N/A;   CARDIAC CATHETERIZATION  11/03/2005   CARDIOVERSION N/A 09/26/2018   Procedure: CARDIOVERSION;  Surgeon: Geralynn Rile, MD;  Location: [facility];  Service: Endoscopy;  Laterality: N/A;   CARDIOVERSION N/A 03/13/2019   Procedure: CARDIOVERSION;  Surgeon: Skeet Latch, MD;  Location: [facility];  Service: Cardiovascular;  Laterality: N/A;   CARDIOVERSION N/A 03/03/2021   Procedure: CARDIOVERSION;  Surgeon: Josue Hector, MD;  Location: [facility];  Service: Cardiovascular;  Laterality: N/A;   CATARACT EXTRACTION  01/19/2012   KNEE ARTHROSCOPY     left knee- 2002    TOTAL HIP ARTHROPLASTY  06/08/2011   Procedure: TOTAL HIP ARTHROPLASTY ANTERIOR APPROACH;  Surgeon:  [name], MD;  Location: [facility];  Service: Orthopedics;  Laterality: Left;   TOTAL HIP ARTHROPLASTY Right 08/12/2020   Procedure: TOTAL HIP ARTHROPLASTY ANTERIOR APPROACH;  Surgeon: Paralee Cancel, MD;  Location: [facility];  Service: Orthopedics;  Laterality: Right;    Family History  Problem Relation Age of Onset    Hypertension Mother    Stroke Mother    Diabetes Mother    Hyperlipidemia Mother    Obesity Mother    Arthritis Father    Heart disease Father    Diabetes Sister    Stroke Maternal Grandmother        8 died    Heart disease Maternal Grandfather    Hypertension Maternal Grandfather    Diabetes Maternal Grandfather    Heart disease Paternal Grandfather     Social:  reports that he has never smoked. He has never used smokeless tobacco. He reports that he does not drink alcohol and does not use drugs.  Allergies: No Known Allergies  Medications: I have reviewed the patient's current medications.  No results found for this or any previous visit (from the past 48 hour(s)).  No results found.  ROS - all of the below systems have been reviewed with the patient and positives are indicated with bold text General: chills, fever or night sweats Eyes: blurry vision or double vision ENT: epistaxis or sore throat Allergy/Immunology: itchy/watery eyes or nasal congestion Hematologic/Lymphatic: bleeding problems, blood clots or swollen lymph nodes Endocrine: temperature intolerance or unexpected weight changes Breast: new or changing breast lumps or nipple discharge Resp: cough, shortness of breath, or wheezing CV: chest pain or dyspnea on exertion GI: as per HPI GU: dysuria, trouble voiding, or hematuria MSK: joint pain or joint stiffness Neuro: TIA or stroke symptoms Derm: pruritus and skin lesion changes Psych: anxiety and depression  PE There were no vitals taken for this visit. Constitutional: NAD; conversant Eyes: Moist conjunctiva Lungs: Normal respiratory effort CV: RRR GI: Abd soft, NT/ND Psychiatric: Appropriate affect  No results found for this or any previous visit (from the past 70 hour(s)).  No results found.   A/P: Clayton [name] is an 76 y.o. male with hx of HTN, HLD, BPH, OSA, afib (on Eliquis) here for surgery for ascending colon  cancer-biopsy/tattooed.  CEA (10/26/21) - <2.0 CT CAP 10/28/21 - no evidence of metastatic disease; questionably identified on CT in the ascending colon-this is rather small on his colonoscopy and therefore may be difficult to see on CT scan.  -The anatomy and physiology of the GI tract was reviewed with the patient. The pathophysiology of colorectal cancer was discussed as well with associated pictures. -We have discussed various different treatment options going forward including surgery (the most definitive) to address this -laparoscopic right hemicolectomy  -Cardiac clearance-Dr. Shelva Majestic - saw Dr. Claiborne Billings 12/02/21 and was cleared for surgery.  -The planned procedure, material risks (including, but not limited to, pain, bleeding, infection, scarring, need for blood transfusion, damage to surrounding structures- blood vessels/nerves/viscus/organs, damage to ureter, urine leak, leak from anastomosis, need for additional procedures, scenarios where a stoma may be necessary and where it may be permanent, worsening of pre-existing medical conditions, hernia, recurrence, pneumonia, heart attack, stroke, death) benefits and alternatives to surgery were discussed at length. The patient's questions were answered to his satisfaction, he voiced understanding and elected to proceed with surgery. Additionally, we discussed typical postoperative expectations and the recovery process.   Nadeen Landau, [facility], [place]

## 2021-12-21 NOTE — Op Note (Signed)
PATIENT: Clayton [name]  76 y.o. male  Patient Care Team: Ma Hillock, DO as PCP - General (Family Medicine) [name], MD as PCP - Cardiology (Cardiology) Constance Haw, MD as PCP - Electrophysiology (Cardiology) [name], MD as Consulting Physician (Cardiology) Warden Fillers, MD as Consulting Physician (Ophthalmology) Sharyne Peach, MD as Consulting Physician (Ophthalmology) Sydnee Levans, MD as Consulting Physician (Dermatology) Vicie Mutters, MD as Consulting Physician (Otolaryngology) Paralee Cancel, MD as Consulting Physician (Orthopedic Surgery) Ardis Hughs, MD as Attending Physician (Urology)  PREOP DIAGNOSIS: Invasive adenocarcinoma, ascending colon  POSTOP DIAGNOSIS: Same  PROCEDURE: Laparoscopic right hemicolectomy  SURGEON: Sharon Mt. Eder Macek, MD  ASSISTANT: Sheldon Silvan, MD PGY-5  ANESTHESIA: General endotracheal  EBL: 50 mL Total I/O In: 350 [IV Piggyback:350] Out: 300 [Urine:250; Blood:50]  DRAINS: None  SPECIMEN: Right colon (including terminal ileum, appendix)  COUNTS: Sponge, needle and instrument counts were reported correct x2  FINDINGS:  Some omental containing adhesions in the left lower quadrant around the medial umbilical ligament.  No evidence of metastatic disease on visceral parietal peritoneum or liver. Tattoo evident at the level of the hepatic flexure with mass proximal to this that is small and consistent with what is seen endoscopically.  Laparoscopic right hemicolectomy with ileocolic anastomosis carried out uneventfully.  NARRATIVE:  The patient was identified & brought into the operating room, placed supine on the operating table and SCDs were applied to the lower extremities. General endotracheal anesthesia was induced. He was positioned supine with arms tucked. Antibiotics were administered. A foley catheter was placed under sterile conditions. Hair in the region of planned surgery was clipped.  The abdomen was prepped and draped in a sterile fashion. A timeout was performed confirming our patient and plan.   Beginning with the extraction port, a supraumbilical incision was made and carried down to the midline fascia. This was then incised with electrocautery. The peritoneum was identified and elevated between clamps and carefully opened sharply. A small Alexis wound protector with a cap and associated port was then placed. The abdomen was insufflated to 15 mmHg with CO2. A laparoscope was placed and camera inspection revealed no evidence of injury. Bilateral TAP blocks were then performed under laparoscopic visualization using a mixture of 0.25% marcaine with epinepherine + Exparel. 3 additional ports were then placed under direct laparoscopic visualization - two in the left hemiabdomen and one in the right abdomen. The abdomen was surveyed. The liver and peritoneum appeared normal.  There were no signs of metastatic disease. Tattoo evident at the level of the hepatic flexure  He was positioned in trendelenburg with left side down.  Fairly significant amounts of intra-abdominal adipose tissue somewhat impeded visualization and therefore a lateral to medial approach was employed.  The omentum was reflected above the liver.  The appendix is readily identified and retracted medially.  Attachments of the appendix are freed from the abdominal wall.  There also omental adhesions extending down into the pelvis to his medial umbilical ligaments that is freed laparoscopically.  In doing so, were able to better visualize the right lower quadrant anatomy.  This also aided in retraction of the omentum into the upper abdomen.  The appendix was further mobilized medially freeing its attachments.  The terminal ileum was also mobilized from the underlying retroperitoneal structures, taking care to preserve the gonadal vessels and right ureter free of any injury.  Following this, the cecum and ascending colon are fully  mobilized medially by incising the Jaeceon Michelin line  of [place].  The associated mesocolon is also mobilized medially.  The beginnings of the duodenum were identified and this is carefully swept down.  He is then repositioned in reverse Trendelenburg.  The omentum was retracted anteriorly and the transverse colon was retracted caudad.  Are able to mobilize the omentum from the transverse colon in doing so to gain access to the lesser sac.  This is carried laterally working towards the hepatic flexure and in doing so were able to completely mobilize the hepatic flexure free.  The duodenum was also notified from this approach and the overlying mesocolon is carefully freed from its attachments.  The retroperitoneum is reinspected and hemostasis is verified.  We are then able to confirm that the appendix reaches into the left upper quadrant without any difficulty as does the distal ileum.  A locking grasper was then placed onto the appendix.  At this point, the abdomen was desufflated and the terminal ileum and right colon delivered through the wound protector.  A window was created in the mesentery of the distal ileum.  The distal ileum was then divided using a GIA blue load stapler. A Kary Kos is then placed onto the proximal ileal staple line to assist in maintaining orientation.  Staple lines were inspected and noted to be intact and hemostatic.  The mesentery is then ligated and divided using the laparoscopic Enseal device.  The ileocolic pedicle was identified, circumferentially dissected, and then ligated and divided using the laparoscopic Enseal device near its origin.  The ileocolic pedicle was inspected and noted to be hemostatic.  We then turned our attention to the distal point of transection.  The ascending colon and hepatic flexure fully mobilized out of the wound protector.  There is a subtle mass palpable in the ascending colon proximal to the tattoo at the expected location.  The distal point of transection  is identified at a location that includes the right branch of the middle colic vessel.  This preserve the main branch of the middle colic feeding the remaining transverse colon.  A window was created the mesentery at this level and the transverse colon is divided using a GIA blue load stapler.  The staple was reinspected and noted to be intact and hemostatic.  The intervening mesentery between the area just proximal to the ileocolic pedicle is ligated and divided using the laparoscopic Enseal device.  The right branch of the middle colic is also taken in this fashion.  The specimen is is then passed off.  Attention was turned to creating the anastomosis. The distal ileum and transverse colon were inspected for orientation to ensure no twisting nor bowel included in the mesenteric defect. An anastomosis was created between the distal ileum and the transverse colon using a 75 mm GIA blue load stapler. The staple line was inspected and noted to be hemostatic.  The common enterotomy channel was closed using a TA 90 blue load stapler incorporating the prior ileal staple line with this. Hemostasis was achieved at the staple line using 3-0 silk U-stitches. 3-0 silk sutures were used to imbricate the corners of the staple line as well.  A 2-0 silk suture was placed securing the apex of the anastomosis. The anastomosis was palpated and noted to be widely patent, 3 fingerbreadth. This was then placed back into the abdomen. The abdomen was then irrigated with sterile saline and hemostasis verified. The omentum was then brought down over the anastomosis. The wound protector cap was replaced and CO2 reinsufflated. The laparoscopic  ports were removed under direct visualization and the sites noted to be hemostatic. The Alexis wound protector was removed, counts were reported correct, and we switched to clean instruments, gowns and drapes.   The fascia was then closed using two running #1 PDS sutures. All sponge, needle and  instrument counts were reported correct.  The skin of all incision sites was closed with 4-0 monocryl subcuticular suture. Dermabond was placed on all incision sites. He was then awakened from anesthesia and sent to recovery in stable condition.   DISPOSITION: PACU in satisfactory condition

## 2021-12-21 NOTE — Anesthesia Postprocedure Evaluation (Signed)
Anesthesia Post Note  Patient: Clayton [name]  Procedure(s) Performed: LAPAROSCOPIC RIGHT HEMICOLECTOMY (Right)     Patient location during evaluation: PACU Anesthesia Type: General Level of consciousness: awake and alert Pain management: pain level controlled Vital Signs Assessment: post-procedure vital signs reviewed and stable Respiratory status: spontaneous breathing, nonlabored ventilation, respiratory function stable and patient connected to nasal cannula oxygen Cardiovascular status: blood pressure returned to baseline and stable Postop Assessment: no apparent nausea or vomiting Anesthetic complications: no   No notable events documented.  Last Vitals:  Vitals:   12/21/21 1615 12/21/21 1630  BP: 118/67 113/71  Pulse: 68 69  Resp: 19 15  Temp:  (!) 36.3 C  SpO2: 93% 94%    Last Pain:  Vitals:   12/21/21 1630  TempSrc:   PainSc: 0-No pain                 Belenda Cruise P Ariya Bohannon

## 2021-12-22 ENCOUNTER — Encounter (HOSPITAL_COMMUNITY): Payer: Self-pay | Admitting: Surgery

## 2021-12-22 ENCOUNTER — Other Ambulatory Visit (HOSPITAL_COMMUNITY): Payer: Self-pay

## 2021-12-22 DIAGNOSIS — Z9049 Acquired absence of other specified parts of digestive tract: Secondary | ICD-10-CM | POA: Diagnosis not present

## 2021-12-22 LAB — GLUCOSE, CAPILLARY
Glucose-Capillary: 142 mg/dL — ABNORMAL HIGH (ref 70–99)
Glucose-Capillary: 148 mg/dL — ABNORMAL HIGH (ref 70–99)
Glucose-Capillary: 98 mg/dL (ref 70–99)
Glucose-Capillary: 135 mg/dL — ABNORMAL HIGH (ref 70–99)

## 2021-12-22 LAB — CBC
HCT: 39.6 % (ref 39.0–52.0)
Hemoglobin: 13.1 g/dL (ref 13.0–17.0)
MCH: 28.7 pg (ref 26.0–34.0)
MCHC: 33.1 g/dL (ref 30.0–36.0)
MCV: 86.7 fL (ref 80.0–100.0)
Platelets: 206 10*3/uL (ref 150–400)
RBC: 4.57 MIL/uL (ref 4.22–5.81)
RDW: 17.2 % — ABNORMAL HIGH (ref 11.5–15.5)
WBC: 11.1 10*3/uL — ABNORMAL HIGH (ref 4.0–10.5)
nRBC: 0 % (ref 0.0–0.2)

## 2021-12-22 LAB — BASIC METABOLIC PANEL
Anion gap: 7 (ref 5–15)
BUN: 19 mg/dL (ref 8–23)
CO2: 22 mmol/L (ref 22–32)
Calcium: 9.7 mg/dL (ref 8.9–10.3)
Chloride: 105 mmol/L (ref 98–111)
Creatinine, Ser: 1.27 mg/dL — ABNORMAL HIGH (ref 0.61–1.24)
GFR, Estimated: 59 mL/min — ABNORMAL LOW (ref >60)
Glucose, Bld: 146 mg/dL — ABNORMAL HIGH (ref 70–99)
Potassium: 4.7 mmol/L (ref 3.5–5.1)
Sodium: 134 mmol/L — ABNORMAL LOW (ref 135–145)

## 2021-12-22 MED ORDER — TRAMADOL HCL 50 MG PO TABS
50.0000 mg | ORAL_TABLET | Freq: Four times a day (QID) | ORAL | 0 refills | Status: DC | PRN
  Filled 2021-12-22: qty 10, 3d supply, fill #0

## 2021-12-22 NOTE — Progress Notes (Signed)
Mobility Specialist - Progress Note   12/22/21 1216  Mobility  Activity Ambulated independently in hallway  Level of Assistance Independent  Assistive Device None  Distance Ambulated (ft) 260 ft  Activity Response Tolerated well  Mobility Referral Yes  $Mobility charge 1 Mobility   Pt received EOB and agreeable to mobility. No complaints during mobility session. Pt to bed after session with all needs met & family in room.   [facility]

## 2021-12-22 NOTE — Progress Notes (Signed)
Subjective No acute events. Feeling quite well. No n/v. Pain well controlled, more soreness than pain. Tolerating liquids without issue. No flatus/BM yet. Up walking around, foley out  Objective: Vital signs in last 24 hours: Temp:  [97.4 F (36.3 C)-99.7 F (37.6 C)] 99.7 F (37.6 C) (12/05 0519) Pulse Rate:  [66-110] 106 (12/05 0519) Resp:  [12-21] 18 (12/05 0519) BP: (113-158)/(63-97) 158/97 (12/05 0519) SpO2:  [93 %-100 %] 97 % (12/05 0519) Weight:  [120.2 kg] 120.2 kg (12/04 1035) Last BM Date : 12/21/21  Intake/Output from previous day: 12/04 0701 - 12/05 0700 In: 3743.2 [P.O.:720; I.V.:2673.2; IV Piggyback:350] Out: 2600 [Urine:2550; Blood:50] Intake/Output this shift: No intake/output data recorded.  Gen: NAD, comfortable CV: Irreg rate/rhythm Pulm: Normal work of breathing Abd: Soft, NT/ND. Incisions c/d/I without erythema nor drainage Ext: SCDs in place  Lab Results: CBC  Recent Labs    12/22/21 0445  WBC 11.1*  HGB 13.1  HCT 39.6  PLT 206   BMET Recent Labs    12/22/21 0445  NA 134*  K 4.7  CL 105  CO2 22  GLUCOSE 146*  BUN 19  CREATININE 1.27*  CALCIUM 9.7   PT/INR No results for input(s): "LABPROT", "INR" in the last 72 hours. ABG No results for input(s): "PHART", "HCO3" in the last 72 hours.  Invalid input(s): "PCO2", "PO2"  Studies/Results:  Anti-infectives: Anti-infectives (From admission, onward)    Start     Dose/Rate Route Frequency Ordered Stop   12/21/21 1400  neomycin (MYCIFRADIN) tablet 1,000 mg  Status:  Discontinued       See Hyperspace for full Linked Orders Report.   1,000 mg Oral 3 times per day 12/21/21 1033 12/21/21 1036   12/21/21 1400  metroNIDAZOLE (FLAGYL) tablet 1,000 mg  Status:  Discontinued       See Hyperspace for full Linked Orders Report.   1,000 mg Oral 3 times per day 12/21/21 1033 12/21/21 1036   12/21/21 1045  cefoTEtan (CEFOTAN) 2 g in sodium chloride 0.9 % 100 mL IVPB        2 g 200 mL/hr over 30  Minutes Intravenous On call to O.R. 12/21/21 1033 12/21/21 1348        Assessment/Plan: Patient Active Problem List   Diagnosis Date Noted   S/P right hemicolectomy 12/21/2021   Normocytic anemia 12/21/2021   Chronic diastolic heart failure ([employer]) 12/21/2021   Aortic atherosclerosis ([place]) 12/21/2021   Colonic mass 11/05/2021   Primary hypertension 11/05/2021   Stress 10/06/2021   Adenocarcinoma, colon ([place]) 08/28/2021   Vaccination declined- ALL 07/27/2021   Impaired renal function 07/22/2021   Osteoarthritis of left knee 07/22/2021   Paroxysmal atrial fibrillation ([place]) 02/09/2021   Samyria Rudie coat syndrome with hypertension 02/09/2021   Degenerative disc disease, lumbar 01/21/2021   Class 2 severe obesity with serious comorbidity and body mass index (BMI) of 35.0 to 35.9 in adult ([place]) 07/04/2020   Secondary hypercoagulable state ([place]) 07/04/2020   Arthritis of right hip 11/13/2019   Pes planus 11/13/2019   Degenerative arthritis of left knee 04/18/2019   Persistent atrial fibrillation ([place])    Nonallopathic lesion of lumbosacral region 04/23/2016   Nonallopathic lesion of sacral region 04/23/2016   Nonallopathic lesion of thoracic region 04/23/2016   Delayed union of rib fracture, left 02/26/2016   Elevated hemoglobin A1c 06/09/2015   BMI 33.0-33.9,adult 06/09/2015   Prediabetes 06/09/2015   OSA on CPAP 08/20/2012   Hyperlipidemia 08/20/2012   S/P left THA, AA 06/08/2011  s/p Procedure(s): LAPAROSCOPIC RIGHT HEMICOLECTOMY 12/21/2021  -Spent time reviewing his procedure, findings and long-terms moving forward.  -Doing well -Advance to solids now as tolerated -D/C IVF, D/C foley -Ambulate 5x/day -TRH following for assistance in comorbidity management - appreciate their help -Ppx: SQH, SCDs; awaiting toleration of diet and hgb stability before restarting PO anticoagulation   LOS: 1 day   Nadeen Landau, MD [facility], [place]

## 2021-12-22 NOTE — Consult Note (Signed)
Hospitalist Service Medical Consultation   Clayton [name]  TFT:[id number]  DOB: Oct 02, 1945  DOA: 12/21/2021  PCP: Ma Hillock, DO    Requesting physician: Dr. Nadeen Landau, general surgery  Reason for consultation: Management of medical problems   History of Present Illness: Clayton [name] is an 76 y.o. male with HTN, HFpEF, PAF, severe OSA and dyslipidemia who was admitted for right hemicolectomy for adenocarcinoma.  Patient underwent a hemicolectomy yesterday 70/06/2374 without complications.  Patient states he is doing very well today.  This is how well he is doing, notes he is able to walk up and down the hallway twice today.  No shortness of breath.  His appetite is good and he is eating and urinating well.  Has not had a bowel movement as yet.  No abdominal pain, notes that it is well-controlled with as needed pain medications.    Assessment and Plan Principal Problem:   S/P right hemicolectomy Active Problems:   OSA on CPAP   Hyperlipidemia   BMI 33.0-33.9,adult   Prediabetes   Paroxysmal atrial fibrillation (HCC)   Adenocarcinoma, colon (HCC)   Primary hypertension   Normocytic anemia   Chronic diastolic heart failure (HCC)   Aortic atherosclerosis (HCC)  HTN BP is well-controlled on present regimen of amlodipine, metoprolol, irbesartan and spironolactone  HFpEF Patient is presently euvolemic on metoprolol, irbesartan and spironolactone Patient is eating and drinking well, creatinine is stable at 1.3, agree with no need for IV fluid resuscitation Continue present management  Elevated glucose without diagnosis of diabetes Blood sugars range 140s to 170s, mostly 140s on present SSI Continue present management  PAF Rate is well-controlled on metoprolol Would recommend restarting anticoagulation as soon as is safe per general surgery recommendations given elevated CHA2DS2-VASc score of 6  Anemia/leukocytosis H&H are stable  postop Leukocytosis is likely reactive, no evidence for infection    Review of Systems:  As per HPI otherwise 10 point review of systems negative.   Past Medical History: Past Medical History:  Diagnosis Date   Aortic atherosclerosis ([place]) 12/21/2021   Arthritis    hips    Atrial fibrillation (HCC)    Cataract    Chronic diastolic heart failure ([place]) 12/21/2021   Cochlear Meniere syndrome of left ear 06/09/2015   CPAP (continuous positive airway pressure) dependence    Dysrhythmia    PAF- hx of 7 years ago    HLD (hyperlipidemia)    Hypertension 05/29/2010   ECHO-EF 67% wnl   Meniere disease    Obesity    OSA on CPAP 08/20/2012   Palpitations    S/P right total hip arthroplasty 08/12/2020   Sleep apnea 09/14/2004   [facility]- Dr. Humphrey Rolls; CPAP titration 11/12/04- Dr. Humphrey Rolls    Past Surgical History: Past Surgical History:  Procedure Laterality Date   ATRIAL FIBRILLATION ABLATION N/A 09/10/2020   Procedure: ATRIAL FIBRILLATION ABLATION;  Surgeon: Constance Haw, MD;  Location: [facility];  Service: Cardiovascular;  Laterality: N/A;   CARDIAC CATHETERIZATION  11/03/2005   CARDIOVERSION N/A 09/26/2018   Procedure: CARDIOVERSION;  Surgeon: Geralynn Rile, MD;  Location: [facility];  Service: Endoscopy;  Laterality: N/A;   CARDIOVERSION N/A 03/13/2019   Procedure: CARDIOVERSION;  Surgeon: Skeet Latch, MD;  Location: [facility] [facility];  Service: Cardiovascular;  Laterality: N/A;   CARDIOVERSION N/A 03/03/2021   Procedure: CARDIOVERSION;  Surgeon: Josue Hector, MD;  Location:  [facility];  Service: Cardiovascular;  Laterality: N/A;   CATARACT EXTRACTION  01/19/2012   KNEE ARTHROSCOPY     left knee- 2002    LAPAROSCOPIC PARTIAL RIGHT COLECTOMY Right 12/21/2021   Procedure: LAPAROSCOPIC RIGHT HEMICOLECTOMY;  Surgeon: Ileana Roup, MD;  Location: [facility];  Service: General;  Laterality: Right;   TOTAL HIP ARTHROPLASTY  06/08/2011    Procedure: TOTAL HIP ARTHROPLASTY ANTERIOR APPROACH;  Surgeon: [name], MD;  Location: [facility];  Service: Orthopedics;  Laterality: Left;   TOTAL HIP ARTHROPLASTY Right 08/12/2020   Procedure: TOTAL HIP ARTHROPLASTY ANTERIOR APPROACH;  Surgeon: Paralee Cancel, MD;  Location: [facility];  Service: Orthopedics;  Laterality: Right;     Allergies:  No Known Allergies   Social History:  reports that he has never smoked. He has never used smokeless tobacco. He reports that he does not drink alcohol and does not use drugs.   Family History: Family History  Problem Relation Age of Onset   Hypertension Mother    Stroke Mother    Diabetes Mother    Hyperlipidemia Mother    Obesity Mother    Arthritis Father    Heart disease Father    Diabetes Sister    Stroke Maternal Grandmother        41 died    Heart disease Maternal Grandfather    Hypertension Maternal Grandfather    Diabetes Maternal Grandfather    Heart disease Paternal Grandfather      Physical Exam: Vitals:   12/22/21 0147 12/22/21 0519 12/22/21 1002 12/22/21 1336  BP: (!) 153/94 (!) 158/97 (!) 149/103 124/77  Pulse: (!) 110 (!) 106 96 93  Resp: '18 18 18 18  '$ Temp: 98.9 F (37.2 C) 99.7 F (37.6 C) 99.1 F (37.3 C) 98.8 F (37.1 C)  TempSrc: Oral Oral Oral Oral  SpO2: 96% 97% 97% 97%  Weight:      Height:        Constitutional: Well-appearing gentleman in excellent spirits, talking malleable he is in no apparent distress CVS: S1-S2 no LE edema, normal pedal pulses  Respiratory:  normal effort,  CTA without adventitious sounds.  GI: NABS, soft, mild tenderness to light palpation in mid left abdominal field. LE: No edema. No cyanosis Neuro: A/O x 3, Moving all extremities equally with normal strength, CN 3-12 intact, grossly nonfocal.  Psych: patient is logical and coherent, judgement and insight appear normal, mood and affect appropriate to situation.   Data reviewed:  I have personally reviewed following  labs and imaging studies Labs:  CBC: Recent Labs  Lab 12/22/21 0445  WBC 11.1*  HGB 13.1  HCT 39.6  MCV 86.7  PLT 545    Basic Metabolic Panel: Recent Labs  Lab 12/22/21 0445  NA 134*  K 4.7  CL 105  CO2 22  GLUCOSE 146*  BUN 19  CREATININE 1.27*  CALCIUM 9.7   GFR Estimated Creatinine Clearance: 70.2 mL/min (A) (by C-G formula based on SCr of 1.27 mg/dL (H)). Liver Function Tests: No results for input(s): "AST", "ALT", "ALKPHOS", "BILITOT", "PROT", "ALBUMIN" in the last 168 hours. No results for input(s): "LIPASE", "AMYLASE" in the last 168 hours. No results for input(s): "AMMONIA" in the last 168 hours. Coagulation profile No results for input(s): "INR", "PROTIME" in the last 168 hours.  Cardiac Enzymes: No results for input(s): "CKTOTAL", "CKMB", "CKMBINDEX", "TROPONINI" in the last 168 hours. BNP: Invalid input(s): "POCBNP" CBG: Recent Labs  Lab 12/21/21 1800 12/21/21 2208 12/22/21 0745 12/22/21 1134  GLUCAP 141* 170* 142* 148*   D-Dimer No results for input(s): "DDIMER" in the last 72 hours. Hgb A1c No results for input(s): "HGBA1C" in the last 72 hours. Lipid Profile No results for input(s): "CHOL", "HDL", "LDLCALC", "TRIG", "CHOLHDL", "LDLDIRECT" in the last 72 hours. Thyroid function studies No results for input(s): "TSH", "T4TOTAL", "T3FREE", "THYROIDAB" in the last 72 hours.  Invalid input(s): "FREET3" Anemia work up No results for input(s): "VITAMINB12", "FOLATE", "FERRITIN", "TIBC", "IRON", "RETICCTPCT" in the last 72 hours. Urinalysis    Component Value Date/Time   COLORURINE YELLOW 07/04/2020 1436   APPEARANCEUR CLEAR 07/04/2020 1436   LABSPEC 1.026 07/04/2020 1436   PHURINE 5.5 07/04/2020 1436   GLUCOSEU NEGATIVE 07/04/2020 1436   HGBUR NEGATIVE 07/04/2020 1436   BILIRUBINUR NEGATIVE 12/26/2012 2045   KETONESUR TRACE (A) 07/04/2020 1436   PROTEINUR 2+ (A) 07/04/2020 1436   UROBILINOGEN 0.2 12/26/2012 2045   NITRITE NEGATIVE  12/26/2012 2045   LEUKOCYTESUR NEGATIVE 12/26/2012 2045     Sepsis Labs Recent Labs  Lab 12/22/21 0445  WBC 11.1*   Microbiology No results found for this or any previous visit (from the past 240 hour(s)).     Inpatient Medications:   Scheduled Meds:  acetaminophen  1,000 mg Oral Q6H   alvimopan  12 mg Oral BID   amLODipine  7.5 mg Oral Daily   ezetimibe  10 mg Oral q AM   feeding supplement  237 mL Oral BID BM   ferrous sulfate  325 mg Oral BID WC   heparin injection (subcutaneous)  5,000 Units Subcutaneous Q8H   insulin aspart  0-15 Units Subcutaneous TID WC   insulin aspart  0-5 Units Subcutaneous QHS   irbesartan  300 mg Oral Daily   metoprolol succinate  100 mg Oral Daily   spironolactone  12.5 mg Oral Daily   tamsulosin  0.4 mg Oral q AM   Continuous Infusions:   Radiological Exams on Admission: No results found.  Impression/Recommendations     Thank you for this consultation.  Our [facility] hospitalist team will follow the patient with you.     Vashti Hey M.D. [facility] 12/22/2021, 4:46 PM Please page [facility] [email or web address] for questions

## 2021-12-22 NOTE — Progress Notes (Signed)
  Transition of Care [facility]) Screening Note   Patient Details  Name: Clayton [name] Date of Birth: November 19, 1945   Transition of [facility]) CM/SW Contact:    Lennart Pall, LCSW Phone Number: 12/22/2021, 10:38 AM    Transition of Care Department [facility]) has reviewed patient and no TOC needs have been identified at this time. We will continue to monitor patient advancement through interdisciplinary progression rounds. If new patient transition needs arise, please place a TOC consult.

## 2021-12-23 DIAGNOSIS — I10 Essential (primary) hypertension: Secondary | ICD-10-CM | POA: Diagnosis not present

## 2021-12-23 DIAGNOSIS — Z9049 Acquired absence of other specified parts of digestive tract: Secondary | ICD-10-CM | POA: Diagnosis not present

## 2021-12-23 LAB — BASIC METABOLIC PANEL
Anion gap: 8 (ref 5–15)
BUN: 23 mg/dL (ref 8–23)
CO2: 22 mmol/L (ref 22–32)
Calcium: 9.8 mg/dL (ref 8.9–10.3)
Chloride: 108 mmol/L (ref 98–111)
Creatinine, Ser: 1.52 mg/dL — ABNORMAL HIGH (ref 0.61–1.24)
GFR, Estimated: 47 mL/min — ABNORMAL LOW (ref >60)
Glucose, Bld: 112 mg/dL — ABNORMAL HIGH (ref 70–99)
Potassium: 4.8 mmol/L (ref 3.5–5.1)
Sodium: 138 mmol/L (ref 135–145)

## 2021-12-23 LAB — CBC
HCT: 40.6 % (ref 39.0–52.0)
Hemoglobin: 13.1 g/dL (ref 13.0–17.0)
MCH: 28.7 pg (ref 26.0–34.0)
MCHC: 32.3 g/dL (ref 30.0–36.0)
MCV: 89 fL (ref 80.0–100.0)
Platelets: 189 10*3/uL (ref 150–400)
RBC: 4.56 MIL/uL (ref 4.22–5.81)
RDW: 17.6 % — ABNORMAL HIGH (ref 11.5–15.5)
WBC: 8.5 10*3/uL (ref 4.0–10.5)
nRBC: 0 % (ref 0.0–0.2)

## 2021-12-23 LAB — GLUCOSE, CAPILLARY
Glucose-Capillary: 122 mg/dL — ABNORMAL HIGH (ref 70–99)
Glucose-Capillary: 104 mg/dL — ABNORMAL HIGH (ref 70–99)
Glucose-Capillary: 143 mg/dL — ABNORMAL HIGH (ref 70–99)
Glucose-Capillary: 116 mg/dL — ABNORMAL HIGH (ref 70–99)

## 2021-12-23 MED ORDER — HYDRALAZINE HCL 25 MG PO TABS
25.0000 mg | ORAL_TABLET | Freq: Three times a day (TID) | ORAL | Status: DC
  Administered 2021-12-23 – 2021-12-24 (×5): 25 mg via ORAL
  Filled 2021-12-23 (×5): qty 1

## 2021-12-23 MED ORDER — AMLODIPINE BESYLATE 10 MG PO TABS
10.0000 mg | ORAL_TABLET | Freq: Every day | ORAL | Status: DC
  Administered 2021-12-23 – 2021-12-24 (×2): 10 mg via ORAL
  Filled 2021-12-23 (×2): qty 1

## 2021-12-23 MED ORDER — APIXABAN 5 MG PO TABS
5.0000 mg | ORAL_TABLET | Freq: Two times a day (BID) | ORAL | Status: DC
  Administered 2021-12-23 – 2021-12-24 (×2): 5 mg via ORAL
  Filled 2021-12-23 (×2): qty 1

## 2021-12-23 NOTE — Progress Notes (Signed)
Subjective No acute events. Feeling quite well. Short lived nausea at 2 am, no emesis, none since. Passing flatus; 2 Bms without any apparent blood. Up walking around. Comfortable on his feet, not needing assistance.  Objective: Vital signs in last 24 hours: Temp:  [98.4 F (36.9 C)-99.2 F (37.3 C)] 98.4 F (36.9 C) (12/06 0525) Pulse Rate:  [83-96] 83 (12/06 0525) Resp:  [18] 18 (12/06 0525) BP: (124-164)/(77-103) 153/95 (12/06 0525) SpO2:  [96 %-97 %] 96 % (12/06 0525) FiO2 (%):  [21 %] 21 % (12/05 2052) Last BM Date : 12/22/21  Intake/Output from previous day: 12/05 0701 - 12/06 0700 In: 1848.2 [P.O.:1680; I.V.:168.2] Out: 1625 [Urine:1625] Intake/Output this shift: No intake/output data recorded.  Gen: NAD, comfortable; up in chair eating breakfast CV: Irreg rate/rhythm Pulm: Normal work of breathing Abd: Soft, NT/ND Ext: SCDs in place  Lab Results: CBC  Recent Labs    12/22/21 0445 12/23/21 0457  WBC 11.1* 8.5  HGB 13.1 13.1  HCT 39.6 40.6  PLT 206 189   BMET Recent Labs    12/22/21 0445 12/23/21 0457  NA 134* 138  K 4.7 4.8  CL 105 108  CO2 22 22  GLUCOSE 146* 112*  BUN 19 23  CREATININE 1.27* 1.52*  CALCIUM 9.7 9.8   PT/INR No results for input(s): "LABPROT", "INR" in the last 72 hours. ABG No results for input(s): "PHART", "HCO3" in the last 72 hours.  Invalid input(s): "PCO2", "PO2"  Studies/Results:  Anti-infectives: Anti-infectives (From admission, onward)    Start     Dose/Rate Route Frequency Ordered Stop   12/21/21 1400  neomycin (MYCIFRADIN) tablet 1,000 mg  Status:  Discontinued       See Hyperspace for full Linked Orders Report.   1,000 mg Oral 3 times per day 12/21/21 1033 12/21/21 1036   12/21/21 1400  metroNIDAZOLE (FLAGYL) tablet 1,000 mg  Status:  Discontinued       See Hyperspace for full Linked Orders Report.   1,000 mg Oral 3 times per day 12/21/21 1033 12/21/21 1036   12/21/21 1045  cefoTEtan (CEFOTAN) 2 g in sodium  chloride 0.9 % 100 mL IVPB        2 g 200 mL/hr over 30 Minutes Intravenous On call to O.R. 12/21/21 1033 12/21/21 1348        Assessment/Plan: Patient Active Problem List   Diagnosis Date Noted   S/P right hemicolectomy 12/21/2021   Normocytic anemia 12/21/2021   Chronic diastolic heart failure ([place]) 12/21/2021   Aortic atherosclerosis ([place]) 12/21/2021   Colonic mass 11/05/2021   Primary hypertension 11/05/2021   Stress 10/06/2021   Adenocarcinoma, colon ([place]) 08/28/2021   Vaccination declined- ALL 07/27/2021   Impaired renal function 07/22/2021   Osteoarthritis of left knee 07/22/2021   Paroxysmal atrial fibrillation ([place]) 02/09/2021   Timothy Townsel coat syndrome with hypertension 02/09/2021   Degenerative disc disease, lumbar 01/21/2021   Class 2 severe obesity with serious comorbidity and body mass index (BMI) of 35.0 to 35.9 in adult [facility]) 07/04/2020   Secondary hypercoagulable state ([place]) 07/04/2020   Arthritis of right hip 11/13/2019   Pes planus 11/13/2019   Degenerative arthritis of left knee 04/18/2019   Persistent atrial fibrillation ([place])    Nonallopathic lesion of lumbosacral region 04/23/2016   Nonallopathic lesion of sacral region 04/23/2016   Nonallopathic lesion of thoracic region 04/23/2016   Delayed union of rib fracture, left 02/26/2016   Elevated hemoglobin A1c 06/09/2015   BMI 33.0-33.9,adult 06/09/2015   Prediabetes  06/09/2015   OSA on CPAP 08/20/2012   Hyperlipidemia 08/20/2012   S/P left THA, AA 06/08/2011   s/p Procedure(s): LAPAROSCOPIC RIGHT HEMICOLECTOMY 12/21/2021  -Doing well -Diabetic solid diet as tolerated -Ambulate 5x/day -D/C SQH; ok to resume Eliquis this PM as hgb stable (ordered), no blood in stool -[facility] following for assistance in comorbidity management - appreciate their help -Cr 1.52 from 1.27 in setting of known CKD. UOP excellent @ 1.6L/24 hr. Continue to monitor. Tolerating good PO and staying hydrated. Avoid medications that may  worsen. If develops PO intolerance, restart MIVF. -Ppx: SQH, SCDs; awaiting toleration of diet and hgb stability before restarting PO anticoagulation  -Dispo: possible discharge as soon as tomorrow if continues to do well, stable on eliquis, cleared by TR[facility].   LOS: 2 days   Nadeen Landau, MD [facility], [place]

## 2021-12-23 NOTE — Progress Notes (Signed)
Mobility Specialist - Progress Note   12/23/21 1250  Mobility  Activity Ambulated independently in hallway  Level of Assistance Independent  Distance Ambulated (ft) 400 ft  Activity Response Tolerated well  Mobility Referral Yes  $Mobility charge 1 Mobility   Pt received EOB and agreeable to mobility. No complaints during mobility. Pt to bed after session with all needs met.     [facility]

## 2021-12-23 NOTE — Progress Notes (Signed)
CONSULT PROGRESS NOTE  Clayton [name] AYT:[id number] DOB: 25-Mar-1945 DOA: 12/21/2021 PCP: Ma Hillock, DO  HPI/Recap of past 24 hours: Clayton [name] is an 76 y.o. male with HTN, HFpEF, PAF, severe OSA and dyslipidemia who was admitted for right hemicolectomy for adenocarcinoma.  Patient underwent a hemicolectomy on 35/05/7320 without complications.    Today, patient denies any new complaints, just some abdominal soreness postop.  Patient ambulating, urinating adequately, adequate bowel movements, tolerating orally.    Assessment/Plan: Principal Problem:   S/P right hemicolectomy Active Problems:   OSA on CPAP   Hyperlipidemia   BMI 33.0-33.9,adult   Prediabetes   Paroxysmal atrial fibrillation (HCC)   Adenocarcinoma, colon (HCC)   Primary hypertension   Normocytic anemia   Chronic diastolic heart failure (HCC)   Aortic atherosclerosis (HCC)   HTN BP stable Increased amlodipine, continue metoprolol, Aldactone.  Added hydralazine  Held irbesartan due to bump in creatinine   HFpEF Patient is presently euvolemic on metoprolol, irbesartan and spironolactone Continue present management   Elevated glucose without diagnosis of diabetes A1c 5.2 Blood sugars range 140s to 170s, mostly 140s on present SSI Continue present management  CKD stage II Creatinine with a slight bump Daily BMP Encouraged to drink orally   PAF Rate is well-controlled on metoprolol Continue apixaban   Obesity Lifestyle modification advised    Estimated body mass index is 33.12 kg/m as calculated from the following:   Height as of this encounter: '6\' 3"'$  (1.905 m).   Weight as of this encounter: 120.2 kg.     Code Status: Full  Family Communication: Son at bedside  Disposition Plan: Status is: Inpatient Remains inpatient appropriate because: Level of care      Consultants: [facility]  Procedures: Right hemicolectomy  Antimicrobials: None  DVT  prophylaxis: Apixaban   Objective: Vitals:   12/22/21 1336 12/22/21 2040 12/23/21 0525 12/23/21 1215  BP: 124/77 (!) 164/94 (!) 153/95 136/85  Pulse: 93 88 83 84  Resp: '18 18 18 18  '$ Temp: 98.8 F (37.1 C) 99.2 F (37.3 C) 98.4 F (36.9 C)   TempSrc: Oral Oral Oral   SpO2: 97% 96% 96% 97%  Weight:      Height:        Intake/Output Summary (Last 24 hours) at 12/23/2021 1940 Last data filed at 12/23/2021 0600 Gross per 24 hour  Intake 600 ml  Output 1100 ml  Net -500 ml   Filed Weights   12/21/21 1035  Weight: 120.2 kg    Exam: General: NAD  Cardiovascular: S1, S2 present Respiratory: CTAB Abdomen: Soft, +tender, nondistended, bowel sounds present, laparoscopic scars, CDI Musculoskeletal: No bilateral pedal edema noted Skin: Normal Psychiatry: Normal mood     Data Reviewed: CBC: Recent Labs  Lab 12/22/21 0445 12/23/21 0457  WBC 11.1* 8.5  HGB 13.1 13.1  HCT 39.6 40.6  MCV 86.7 89.0  PLT 206 025   Basic Metabolic Panel: Recent Labs  Lab 12/22/21 0445 12/23/21 0457  NA 134* 138  K 4.7 4.8  CL 105 108  CO2 22 22  GLUCOSE 146* 112*  BUN 19 23  CREATININE 1.27* 1.52*  CALCIUM 9.7 9.8   GFR: Estimated Creatinine Clearance: 58.7 mL/min (A) (by C-G formula based on SCr of 1.52 mg/dL (H)). Liver Function Tests: No results for input(s): "AST", "ALT", "ALKPHOS", "BILITOT", "PROT", "ALBUMIN" in the last 168 hours. No results for input(s): "LIPASE", "AMYLASE" in the last 168 hours. No results for input(s): "AMMONIA" in the last  168 hours. Coagulation Profile: No results for input(s): "INR", "PROTIME" in the last 168 hours. Cardiac Enzymes: No results for input(s): "CKTOTAL", "CKMB", "CKMBINDEX", "TROPONINI" in the last 168 hours. BNP (last 3 results) No results for input(s): "PROBNP" in the last 8760 hours. HbA1C: No results for input(s): "HGBA1C" in the last 72 hours. CBG: Recent Labs  Lab 12/22/21 1700 12/22/21 2109 12/23/21 0724 12/23/21 1216  12/23/21 1606  GLUCAP 98 135* 122* 104* 143*   Lipid Profile: No results for input(s): "CHOL", "HDL", "LDLCALC", "TRIG", "CHOLHDL", "LDLDIRECT" in the last 72 hours. Thyroid Function Tests: No results for input(s): "TSH", "T4TOTAL", "FREET4", "T3FREE", "THYROIDAB" in the last 72 hours. Anemia Panel: No results for input(s): "VITAMINB12", "FOLATE", "FERRITIN", "TIBC", "IRON", "RETICCTPCT" in the last 72 hours. Urine analysis:    Component Value Date/Time   COLORURINE YELLOW 07/04/2020 [place] 07/04/2020 1436   LABSPEC 1.026 07/04/2020 1436   PHURINE 5.5 07/04/2020 1436   GLUCOSEU NEGATIVE 07/04/2020 1436   HGBUR NEGATIVE 07/04/2020 1436   BILIRUBINUR NEGATIVE 12/26/2012 2045   KETONESUR TRACE (A) 07/04/2020 1436   PROTEINUR 2+ (A) 07/04/2020 1436   UROBILINOGEN 0.2 12/26/2012 2045   NITRITE NEGATIVE 12/26/2012 2045   LEUKOCYTESUR NEGATIVE 12/26/2012 2045   Sepsis Labs: '@LABRCNTIP'$ (procalcitonin:4,lacticidven:4)  )No results found for this or any previous visit (from the past 240 hour(s)).    Studies: No results found.  Scheduled Meds:  acetaminophen  1,000 mg Oral Q6H   amLODipine  10 mg Oral Daily   apixaban  5 mg Oral BID   ezetimibe  10 mg Oral q AM   feeding supplement  237 mL Oral BID BM   ferrous sulfate  325 mg Oral BID WC   hydrALAZINE  25 mg Oral Q8H   insulin aspart  0-15 Units Subcutaneous TID WC   insulin aspart  0-5 Units Subcutaneous QHS   metoprolol succinate  100 mg Oral Daily   spironolactone  12.5 mg Oral Daily   tamsulosin  0.4 mg Oral q AM    Continuous Infusions:   LOS: 2 days     Alma Friendly, MD [facility]  If 7PM-7AM, please contact night-coverage [email or web address] 12/23/2021, 7:40 PM

## 2021-12-24 ENCOUNTER — Other Ambulatory Visit (HOSPITAL_COMMUNITY): Payer: Self-pay

## 2021-12-24 DIAGNOSIS — Z9049 Acquired absence of other specified parts of digestive tract: Secondary | ICD-10-CM | POA: Diagnosis not present

## 2021-12-24 DIAGNOSIS — I10 Essential (primary) hypertension: Secondary | ICD-10-CM | POA: Diagnosis not present

## 2021-12-24 LAB — BASIC METABOLIC PANEL
Anion gap: 9 (ref 5–15)
Anion gap: 8 (ref 5–15)
BUN: 30 mg/dL — ABNORMAL HIGH (ref 8–23)
BUN: 37 mg/dL — ABNORMAL HIGH (ref 8–23)
CO2: 20 mmol/L — ABNORMAL LOW (ref 22–32)
CO2: 21 mmol/L — ABNORMAL LOW (ref 22–32)
Calcium: 9.6 mg/dL (ref 8.9–10.3)
Calcium: 9.7 mg/dL (ref 8.9–10.3)
Chloride: 108 mmol/L (ref 98–111)
Chloride: 106 mmol/L (ref 98–111)
Creatinine, Ser: 1.62 mg/dL — ABNORMAL HIGH (ref 0.61–1.24)
Creatinine, Ser: 1.57 mg/dL — ABNORMAL HIGH (ref 0.61–1.24)
GFR, Estimated: 44 mL/min — ABNORMAL LOW (ref >60)
GFR, Estimated: 46 mL/min — ABNORMAL LOW (ref >60)
Glucose, Bld: 106 mg/dL — ABNORMAL HIGH (ref 70–99)
Glucose, Bld: 107 mg/dL — ABNORMAL HIGH (ref 70–99)
Potassium: 4.8 mmol/L (ref 3.5–5.1)
Potassium: 4.5 mmol/L (ref 3.5–5.1)
Sodium: 137 mmol/L (ref 135–145)
Sodium: 135 mmol/L (ref 135–145)

## 2021-12-24 LAB — CBC
HCT: 40.7 % (ref 39.0–52.0)
Hemoglobin: 13.1 g/dL (ref 13.0–17.0)
MCH: 28.7 pg (ref 26.0–34.0)
MCHC: 32.2 g/dL (ref 30.0–36.0)
MCV: 89.1 fL (ref 80.0–100.0)
Platelets: 184 10*3/uL (ref 150–400)
RBC: 4.57 MIL/uL (ref 4.22–5.81)
RDW: 17.6 % — ABNORMAL HIGH (ref 11.5–15.5)
WBC: 6.9 10*3/uL (ref 4.0–10.5)
nRBC: 0 % (ref 0.0–0.2)

## 2021-12-24 LAB — SURGICAL PATHOLOGY

## 2021-12-24 LAB — GLUCOSE, CAPILLARY
Glucose-Capillary: 126 mg/dL — ABNORMAL HIGH (ref 70–99)
Glucose-Capillary: 105 mg/dL — ABNORMAL HIGH (ref 70–99)

## 2021-12-24 MED ORDER — AMLODIPINE BESYLATE 5 MG PO TABS: 10.0000 mg | ORAL_TABLET | Freq: Every day | ORAL | 2 refills | Status: DC

## 2021-12-24 MED ORDER — LACTATED RINGERS IV BOLUS
500.0000 mL | Freq: Once | INTRAVENOUS | Status: AC
  Administered 2021-12-24: 500 mL via INTRAVENOUS

## 2021-12-24 NOTE — Progress Notes (Addendum)
CONSULT PROGRESS NOTE  Clayton [name] VWU:[id number] DOB: 1945-03-06 DOA: 12/21/2021 PCP: Ma Hillock, DO  HPI/Recap of past 24 hours: Clayton [name] is an 76 y.o. male with HTN, HFpEF, PAF, severe OSA and dyslipidemia who was admitted for right hemicolectomy for adenocarcinoma.  Patient underwent a hemicolectomy on 29/05/6211 without complications.    Today, patient denies any new complaints.  Creatinine with a slight bump, but still close to his baseline.  Received 1 L bolus, with mild improvement in creatinine.  Discussed extensively with patient to hold his Benicar, and increase amlodipine (med rec updated to like to change). Advised to keep a BP log and follow-up with his PCP in 1 week with repeat labs and further instruction on when to restart Benicar pending his repeat creatinine.  Bladder scan with no urinary retention.  Patient encouraged to stay hydrated.    [facility] hospitalist will sign off on 12/24/21    Assessment/Plan: Principal Problem:   S/P right hemicolectomy Active Problems:   OSA on CPAP   Hyperlipidemia   BMI 33.0-33.9,adult   Prediabetes   Paroxysmal atrial fibrillation (HCC)   Adenocarcinoma, colon (HCC)   Primary hypertension   Normocytic anemia   Chronic diastolic heart failure (HCC)   Aortic atherosclerosis (HCC)   HTN BP stable Increased amlodipine, continue metoprolol, Aldactone.  Added hydralazine while inpatient Held irbesartan due to bump in creatinine   HFpEF Patient is presently euvolemic on metoprolol, irbesartan and spironolactone Continue present management   Elevated glucose without diagnosis of diabetes A1c 5.2 Continue home regimen  CKD stage II Creatinine with a slight bump Received 1 L bolus of LR on 12/7, with mild improvement in creatinine.  Discussed extensively with patient to hold his Benicar, and increase amlodipine until follow-up with PCP   PAF Rate is well-controlled on metoprolol Continue apixaban    Obesity Lifestyle modification advised    Estimated body mass index is 33.12 kg/m as calculated from the following:   Height as of this encounter: '6\' 3"'$  (1.905 m).   Weight as of this encounter: 120.2 kg.     Code Status: Full  Family Communication: None at bedside  Disposition Plan: Status is: Inpatient Remains inpatient appropriate because: Level of care      Consultants: [facility] hospitalist  Procedures: Right hemicolectomy  Antimicrobials: None  DVT prophylaxis: Apixaban   Objective: Vitals:   12/23/21 1215 12/23/21 2034 12/24/21 0510 12/24/21 1343  BP: 136/85 (!) 146/84 130/87 132/84  Pulse: 84 79 76 90  Resp: '18 18 18 18  '$ Temp:  97.8 F (36.6 C) 98.3 F (36.8 C) 97.6 F (36.4 C)  TempSrc:  Oral Oral Oral  SpO2: 97% 99% 96% 99%  Weight:      Height:        Intake/Output Summary (Last 24 hours) at 12/24/2021 1442 Last data filed at 12/24/2021 1343 Gross per 24 hour  Intake 1080 ml  Output 1300 ml  Net -220 ml   Filed Weights   12/21/21 1035  Weight: 120.2 kg    Exam: General: NAD  Cardiovascular: S1, S2 present Respiratory: CTAB Abdomen: Soft, nontender, nondistended, bowel sounds present, laparoscopic scars, CDI Musculoskeletal: No bilateral pedal edema noted Skin: Normal Psychiatry: Normal mood     Data Reviewed: CBC: Recent Labs  Lab 12/22/21 0445 12/23/21 0457 12/24/21 0516  WBC 11.1* 8.5 6.9  HGB 13.1 13.1 13.1  HCT 39.6 40.6 40.7  MCV 86.7 89.0 89.1  PLT 206 189 086   Basic Metabolic  Panel: Recent Labs  Lab 12/22/21 0445 12/23/21 0457 12/24/21 0516 12/24/21 1326  NA 134* 138 137 135  K 4.7 4.8 4.8 4.5  CL 105 108 108 106  CO2 22 22 20* 21*  GLUCOSE 146* 112* 106* 107*  BUN 19 23 30* 37*  CREATININE 1.27* 1.52* 1.62* 1.57*  CALCIUM 9.7 9.8 9.6 9.7   GFR: Estimated Creatinine Clearance: 56.8 mL/min (A) (by C-G formula based on SCr of 1.57 mg/dL (H)). Liver Function Tests: No results for input(s): "AST",  "ALT", "ALKPHOS", "BILITOT", "PROT", "ALBUMIN" in the last 168 hours. No results for input(s): "LIPASE", "AMYLASE" in the last 168 hours. No results for input(s): "AMMONIA" in the last 168 hours. Coagulation Profile: No results for input(s): "INR", "PROTIME" in the last 168 hours. Cardiac Enzymes: No results for input(s): "CKTOTAL", "CKMB", "CKMBINDEX", "TROPONINI" in the last 168 hours. BNP (last 3 results) No results for input(s): "PROBNP" in the last 8760 hours. HbA1C: No results for input(s): "HGBA1C" in the last 72 hours. CBG: Recent Labs  Lab 12/23/21 1216 12/23/21 1606 12/23/21 2148 12/24/21 0740 12/24/21 1156  GLUCAP 104* 143* 116* 126* 105*   Lipid Profile: No results for input(s): "CHOL", "HDL", "LDLCALC", "TRIG", "CHOLHDL", "LDLDIRECT" in the last 72 hours. Thyroid Function Tests: No results for input(s): "TSH", "T4TOTAL", "FREET4", "T3FREE", "THYROIDAB" in the last 72 hours. Anemia Panel: No results for input(s): "VITAMINB12", "FOLATE", "FERRITIN", "TIBC", "IRON", "RETICCTPCT" in the last 72 hours. Urine analysis:    Component Value Date/Time   COLORURINE YELLOW 07/04/2020 [place] 07/04/2020 1436   LABSPEC 1.026 07/04/2020 1436   PHURINE 5.5 07/04/2020 1436   GLUCOSEU NEGATIVE 07/04/2020 1436   HGBUR NEGATIVE 07/04/2020 1436   BILIRUBINUR NEGATIVE 12/26/2012 2045   KETONESUR TRACE (A) 07/04/2020 1436   PROTEINUR 2+ (A) 07/04/2020 1436   UROBILINOGEN 0.2 12/26/2012 2045   NITRITE NEGATIVE 12/26/2012 2045   LEUKOCYTESUR NEGATIVE 12/26/2012 2045   Sepsis Labs: '@LABRCNTIP'$ (procalcitonin:4,lacticidven:4)  )No results found for this or any previous visit (from the past 240 hour(s)).    Studies: No results found.  Scheduled Meds:  acetaminophen  1,000 mg Oral Q6H   amLODipine  10 mg Oral Daily   apixaban  5 mg Oral BID   ezetimibe  10 mg Oral q AM   feeding supplement  237 mL Oral BID BM   ferrous sulfate  325 mg Oral BID WC    hydrALAZINE  25 mg Oral Q8H   insulin aspart  0-15 Units Subcutaneous TID WC   insulin aspart  0-5 Units Subcutaneous QHS   metoprolol succinate  100 mg Oral Daily   spironolactone  12.5 mg Oral Daily   tamsulosin  0.4 mg Oral q AM    Continuous Infusions:   LOS: 3 days     Alma Friendly, MD [facility]  If 7PM-7AM, please contact night-coverage [email or web address] 12/24/2021, 2:42 PM

## 2021-12-24 NOTE — Progress Notes (Signed)
Subjective No acute events. Feeling quite well. Short lived nausea at 2 am, no emesis, none since. Passing flatus; 2 Bms without any apparent blood. Up walking around. Comfortable on his feet, not needing assistance.  Objective: Vital signs in last 24 hours: Temp:  [97.8 F (36.6 C)-98.3 F (36.8 C)] 98.3 F (36.8 C) (12/07 0510) Pulse Rate:  [76-84] 76 (12/07 0510) Resp:  [18] 18 (12/07 0510) BP: (130-146)/(84-87) 130/87 (12/07 0510) SpO2:  [96 %-99 %] 96 % (12/07 0510) FiO2 (%):  [21 %] 21 % (12/06 1949) Last BM Date : 12/23/21  Intake/Output from previous day: 12/06 0701 - 12/07 0700 In: 600 [P.O.:600] Out: 1300 [Urine:1300] Intake/Output this shift: No intake/output data recorded.  Gen: NAD, comfortable; up walking around room; good spirits CV: Irreg rate/rhythm Pulm: Normal work of breathing Abd: Soft, NT/ND Ext: SCDs in place  Lab Results: CBC  Recent Labs    12/23/21 0457 12/24/21 0516  WBC 8.5 6.9  HGB 13.1 13.1  HCT 40.6 40.7  PLT 189 184   BMET Recent Labs    12/23/21 0457 12/24/21 0516  NA 138 137  K 4.8 4.8  CL 108 108  CO2 22 20*  GLUCOSE 112* 106*  BUN 23 30*  CREATININE 1.52* 1.62*  CALCIUM 9.8 9.6   PT/INR No results for input(s): "LABPROT", "INR" in the last 72 hours. ABG No results for input(s): "PHART", "HCO3" in the last 72 hours.  Invalid input(s): "PCO2", "PO2"  Studies/Results:  Anti-infectives: Anti-infectives (From admission, onward)    Start     Dose/Rate Route Frequency Ordered Stop   12/21/21 1400  neomycin (MYCIFRADIN) tablet 1,000 mg  Status:  Discontinued       See Hyperspace for full Linked Orders Report.   1,000 mg Oral 3 times per day 12/21/21 1033 12/21/21 1036   12/21/21 1400  metroNIDAZOLE (FLAGYL) tablet 1,000 mg  Status:  Discontinued       See Hyperspace for full Linked Orders Report.   1,000 mg Oral 3 times per day 12/21/21 1033 12/21/21 1036   12/21/21 1045  cefoTEtan (CEFOTAN) 2 g in sodium chloride  0.9 % 100 mL IVPB        2 g 200 mL/hr over 30 Minutes Intravenous On call to O.R. 12/21/21 1033 12/21/21 1348        Assessment/Plan: Patient Active Problem List   Diagnosis Date Noted   S/P right hemicolectomy 12/21/2021   Normocytic anemia 12/21/2021   Chronic diastolic heart failure ([place]) 12/21/2021   Aortic atherosclerosis ([place]) 12/21/2021   Colonic mass 11/05/2021   Primary hypertension 11/05/2021   Stress 10/06/2021   Adenocarcinoma, colon ([place]) 08/28/2021   Vaccination declined- ALL 07/27/2021   Impaired renal function 07/22/2021   Osteoarthritis of left knee 07/22/2021   Paroxysmal atrial fibrillation ([place]) 02/09/2021   Janice Bodine coat syndrome with hypertension 02/09/2021   Degenerative disc disease, lumbar 01/21/2021   Class 2 severe obesity with serious comorbidity and body mass index (BMI) of 35.0 to 35.9 in adult [facility]) 07/04/2020   Secondary hypercoagulable state ([place]) 07/04/2020   Arthritis of right hip 11/13/2019   Pes planus 11/13/2019   Degenerative arthritis of left knee 04/18/2019   Persistent atrial fibrillation ([place])    Nonallopathic lesion of lumbosacral region 04/23/2016   Nonallopathic lesion of sacral region 04/23/2016   Nonallopathic lesion of thoracic region 04/23/2016   Delayed union of rib fracture, left 02/26/2016   Elevated hemoglobin A1c 06/09/2015   BMI 33.0-33.9,adult 06/09/2015   Prediabetes  06/09/2015   OSA on CPAP 08/20/2012   Hyperlipidemia 08/20/2012   S/P left THA, AA 06/08/2011   s/p Procedure(s): LAPAROSCOPIC RIGHT HEMICOLECTOMY 12/21/2021  -Doing well -Diabetic solid diet as tolerated -Ambulate 5x/day -Hgb stable on Eliquis -TRH following for assistance in comorbidity management - appreciate their help -Cr 1.62<--1.52<--1.27 in setting of known CKD. UOP excellent @ 1.3L/24 hr. Gave 500 cc bolus this morning. Tolerating PO and staying hydrated. Avoid medications that may worsen. If develops PO intolerance, restart MIVF. -Ppx:  Eliquis, SCDs  -Dispo: Reports when he had hip surgery, exact same thing happened with Cr following surgery. Expect this will be self-limited. Noted med changes by TRH. Awaiting clearance for discharge by [facility] - may need to repeat Cr in AM. Will defer to  their better judgement.  **Reviewed results of pathology with him today -  A. COLON, RIGHT, RESECTION:  - Invasive moderately differentiated adenocarcinoma, 2.6 cm, involving  ascending colon  - Carcinoma invades into the submucosa  - Negative for lymphovascular invasion  - Resection margins are negative for carcinoma  - Benign unremarkable appendix  - Separate tubular adenoma without high-grade dysplasia  - Separate sessile serrated polyp without cytologic dysplasia  pT1, pN0 (0/16 LNs)   LOS: 3 days   Nadeen Landau, MD [facility], [place]

## 2021-12-24 NOTE — Progress Notes (Signed)
Patient bladder scanned and showed 30m residual.

## 2021-12-24 NOTE — Progress Notes (Signed)
Discharge instructions given to patient and all questions were answered.  

## 2021-12-24 NOTE — Discharge Instructions (Signed)
POST OP INSTRUCTIONS AFTER COLON SURGERY  DIET: Be sure to include lots of fluids daily to stay hydrated - 64oz of water per day (8, 8 oz glasses).  Avoid fast food or heavy meals for the first couple of weeks as your are more likely to get nauseated. Avoid raw/uncooked fruits or vegetables for the first 4 weeks (its ok to have these if they are blended into smoothie form). If you have fruits/vegetables, make sure they are cooked until soft enough to mash on the roof of your mouth and chew your food well. Otherwise, diet as tolerated.  Take your usually prescribed home medications unless otherwise directed.  PAIN CONTROL: Pain is best controlled by a usual combination of three different methods TOGETHER: Ice/Heat Over the counter pain medication Prescription pain medication Most patients will experience some swelling and bruising around the surgical site.  Ice packs or heating pads (30-60 minutes up to 6 times a day) will help. Some people prefer to use ice alone, heat alone, alternating between ice & heat.  Experiment to what works for you.  Swelling and bruising can take several weeks to resolve.   It is helpful to take an over-the-counter pain medication regularly for the first few weeks: Ibuprofen (Motrin/Advil) - 200mg tabs - take 3 tabs (600mg) every 6 hours as needed for pain (unless you have been directed previously to avoid NSAIDs/ibuprofen) Acetaminophen (Tylenol) - you may take 650mg every 6 hours as needed. You can take this with motrin as they act differently on the body. If you are taking a narcotic pain medication that has acetaminophen in it, do not take over the counter tylenol at the same time. NOTE: You may take both of these medications together - most patients  find it most helpful when alternating between the two (i.e. Ibuprofen at 6am, tylenol at 9am, ibuprofen at 12pm ...) A  prescription for pain medication should be given to you upon discharge.  Take your pain medication as  prescribed if your pain is not adequatly controlled with the over-the-counter pain reliefs mentioned above.  Avoid getting constipated.  Between the surgery and the pain medications, it is common to experience some constipation.  Increasing fluid intake and taking a fiber supplement (such as Metamucil, Citrucel, FiberCon, MiraLax, etc) 1-2 times a day regularly will usually help prevent this problem from occurring.  A mild laxative (prune juice, Milk of Magnesia, MiraLax, etc) should be taken according to package directions if there are no bowel movements after 48 hours.    Dressing: Your incisions are covered in Dermabond which is like sterile superglue for the skin. This will come off on it's own in a couple weeks. It is waterproof and you may bathe normally starting the day after your surgery in a shower. Avoid baths/pools/lakes/oceans until your wounds have fully healed.  ACTIVITIES as tolerated:   Avoid heavy lifting (>10lbs or 1 gallon of milk) for the next 6 weeks. You may resume regular daily activities as tolerated--such as daily self-care, walking, climbing stairs--gradually increasing activities as tolerated.  If you can walk 30 minutes without difficulty, it is safe to try more intense activity such as jogging, treadmill, bicycling, low-impact aerobics.  DO NOT PUSH THROUGH PAIN.  Let pain be your guide: If it hurts to do something, don't do it. You may drive when you are no longer taking prescription pain medication, you can comfortably wear a seatbelt, and you can safely maneuver your car and apply brakes.  FOLLOW UP in our   office Please call [facility] at [phone] to set up an appointment to see your surgeon in the office for a follow-up appointment approximately 2 weeks after your surgery. Make sure that you call for this appointment the day you arrive home to insure a convenient appointment time.  9. If you have disability or family leave forms that need to be completed, you may have  them completed by your primary care physician's office; for return to work instructions, please ask our office staff and they will be happy to assist you in obtaining this documentation   When to call us [phone]: Poor pain control Reactions / problems with new medications (rash/itching, etc)  Fever over 101.5 F (38.5 C) Inability to urinate Nausea/vomiting Worsening swelling or bruising Continued bleeding from incision. Increased pain, redness, or drainage from the incision  The clinic staff is available to answer your questions during regular business hours (8:30am-5pm).  Please don't hesitate to call and ask to speak to one of our nurses for clinical concerns.   A surgeon from [facility] is always on call at the hospitals   If you have a medical emergency, go to the nearest emergency room or call 911.  [facility] [address], [address], [place], [place]  [address] MAIN: [phone] FAX: [phone] [email or web address]  

## 2021-12-25 ENCOUNTER — Telehealth: Payer: Self-pay

## 2021-12-25 NOTE — Patient Outreach (Signed)
  Care Coordination TOC Note Transition Care Management Follow-up Telephone Call Date of discharge and from where: 12/24/21-[facility]  Dx "s/p hemicolectomy" How have you been since you were released from the hospital? Patient pleased to report how well he is dong. He shares that overall this has been a good experience for him. He is not really having pain-just more soreness with movement which is to be expected. He has pain med in the home as needed but has not had to take it.  Any questions or concerns? No  Items Reviewed: Did the pt receive and understand the discharge instructions provided? Yes  Medications obtained and verified? Yes  Other? Yes  post-op care Any new allergies since your discharge? No  Dietary orders reviewed? Yes Do you have support at home? Yes   Home Care and Equipment/Supplies: Were home health services ordered? not applicable If so, what is the name of the agency? N/A  Has the agency set up a time to come to the patient's home? not applicable Were any new equipment or medical supplies ordered?  No What is the name of the medical supply agency? N/A Were you able to get the supplies/equipment? not applicable Do you have any questions related to the use of the equipment or supplies? No  Functional Questionnaire: (I = Independent and D = Dependent) ADLs: I  Bathing/Dressing- I  Meal Prep- A  Eating- I  Maintaining continence- I  Transferring/Ambulation- I  Managing Meds- I  Follow up appointments reviewed:  PCP Hospital f/u appt confirmed? Patient will call office today to make follow up appt. [facility] f/u appt confirmed?  Yes-Scheduled to see surgeon on 01/21/22.  . Are transportation arrangements needed? No  If their condition worsens, is the pt aware to call PCP or go to the Emergency Dept.? Yes Was the patient provided with contact information for the PCP's office or ED? Yes Was to pt encouraged to call back with questions or  concerns? Yes  SDOH assessments and interventions completed:   Yes SDOH Interventions Today    Flowsheet Row Most Recent Value  SDOH Interventions   Food Insecurity Interventions Intervention Not Indicated  Transportation Interventions Intervention Not Indicated       Care Coordination Interventions:  Education provided    Encounter Outcome:  Pt. Visit Completed    Enzo Montgomery, RN,BSN,CCM [facility] Telephonic Care Management Coordinator Direct Phone: [phone] Toll Free: [phone] Fax: [phone]

## 2021-12-28 ENCOUNTER — Encounter (INDEPENDENT_AMBULATORY_CARE_PROVIDER_SITE_OTHER): Payer: Self-pay | Admitting: Family Medicine

## 2021-12-28 NOTE — Discharge Summary (Signed)
Patient ID: JAMMY PLOTKIN MRN: [id number] DOB/AGE: [birth year] 75 y.o.  Admit date: 12/21/2021 Discharge date: 12/24/2021  Discharge Diagnoses Patient Active Problem List   Diagnosis Date Noted   S/P right hemicolectomy 12/21/2021   Normocytic anemia 12/21/2021   Chronic diastolic heart failure ([place]) 12/21/2021   Aortic atherosclerosis ([place]) 12/21/2021   Colonic mass 11/05/2021   Primary hypertension 11/05/2021   Stress 10/06/2021   Adenocarcinoma, colon ([place]) 08/28/2021   Vaccination declined- ALL 07/27/2021   Impaired renal function 07/22/2021   Osteoarthritis of left knee 07/22/2021   Paroxysmal atrial fibrillation ([place]) 02/09/2021   Zuleima Haser coat syndrome with hypertension 02/09/2021   Degenerative disc disease, lumbar 01/21/2021   Class 2 severe obesity with serious comorbidity and body mass index (BMI) of 35.0 to 35.9 in adult [facility]) 07/04/2020   Secondary hypercoagulable state ([place]) 07/04/2020   Arthritis of right hip 11/13/2019   Pes planus 11/13/2019   Degenerative arthritis of left knee 04/18/2019   Persistent atrial fibrillation ([place])    Nonallopathic lesion of lumbosacral region 04/23/2016   Nonallopathic lesion of sacral region 04/23/2016   Nonallopathic lesion of thoracic region 04/23/2016   Delayed union of rib fracture, left 02/26/2016   Elevated hemoglobin A1c 06/09/2015   BMI 33.0-33.9,adult 06/09/2015   Prediabetes 06/09/2015   OSA on CPAP 08/20/2012   Hyperlipidemia 08/20/2012   S/P left THA, AA 06/08/2011    Consultants TRH/Internal medicine  Procedures OR 12/21/21  Hospital Course: He was taken to the OR - laparoscopic right hemicolectomy 12/21/21. He was admitted postoperatively and recovered well. TRH was consulted for assistance in managing comorbidities. His diet was advanced and he progressed appropriately. On 12/24/21 he was comfortable with and stable for discharge home. Postop instructions and expectations were reviewed. Pathology was discussed.  Follow up in my office arranged.   Allergies as of 12/24/2021   No Known Allergies      Medication List     STOP taking these medications    olmesartan 40 MG tablet Commonly known as: BENICAR       TAKE these medications    amLODipine 5 MG tablet Commonly known as: NORVASC Take 2 tablets (10 mg total) by mouth daily. What changed: how much to take   Coenzyme Q10 200 MG capsule Take 200 mg by mouth in the morning.   Eliquis 5 MG Tabs tablet Generic drug: apixaban TAKE 1 TABLET BY MOUTH TWICE A DAY   ezetimibe 10 MG tablet Commonly known as: ZETIA Take 1 tablet (10 mg total) by mouth in the morning.   ferrous sulfate 325 (65 FE) MG EC tablet Take 325 mg by mouth in the morning and at bedtime.   Livalo 2 MG Tabs Generic drug: Pitavastatin Calcium Take 1 tablet (2 mg total) by mouth every evening.   metFORMIN 500 MG tablet Commonly known as: GLUCOPHAGE Take 1 tablet (500 mg total) by mouth daily with breakfast.   metoprolol succinate 100 MG 24 hr tablet Commonly known as: TOPROL-XL Take 1 tablet (100 mg total) by mouth daily. What changed:  how much to take when to take this   multivitamin with minerals tablet Take 1 tablet by mouth daily.   spironolactone 25 MG tablet Commonly known as: ALDACTONE Take 1 tablet (25 mg) alternating every other day with 0.5 tablet (12.5 mg) What changed:  how much to take how to take this when to take this additional instructions   tamsulosin 0.4 MG Caps capsule Commonly known as: FLOMAX Take 0.4 mg by  mouth in the morning.   traMADol 50 MG tablet Commonly known as: ULTRAM Take 1 tablet (50 mg total) by mouth every 6 (six) hours as needed (postop pain not controlled with tylenol first).   triamcinolone cream 0.5 % Commonly known as: KENALOG APPLY 1 APPLICATION TOPICALLY 2 (TWO) TIMES DAILY. TO AFFECTED AREAS. What changed:  when to take this reasons to take this additional instructions           Follow-up Information     Ileana Roup, MD Follow up on 01/21/2022.   Specialties: General Surgery, Colon and Rectal Surgery Contact information: [place] [address] [phone]         Howard Pouch A, DO. Schedule an appointment as soon as possible for a visit in 1 week(s).   Specialty: Family Medicine Contact information: [place] [address] [phone]                 Sharon Mt. Dema Severin, M.D. [facility]

## 2021-12-29 ENCOUNTER — Ambulatory Visit: Payer: Medicare PPO | Admitting: Cardiology

## 2021-12-31 ENCOUNTER — Ambulatory Visit (INDEPENDENT_AMBULATORY_CARE_PROVIDER_SITE_OTHER): Payer: Medicare PPO | Admitting: Family Medicine

## 2022-01-01 ENCOUNTER — Ambulatory Visit: Payer: Medicare PPO | Admitting: Family Medicine

## 2022-01-01 ENCOUNTER — Encounter: Payer: Self-pay | Admitting: Family Medicine

## 2022-01-01 VITALS — BP 130/75 | HR 70 | Temp 98.0°F | Ht 75.0 in | Wt 263.0 lb

## 2022-01-01 DIAGNOSIS — Z9049 Acquired absence of other specified parts of digestive tract: Secondary | ICD-10-CM

## 2022-01-01 DIAGNOSIS — Z85038 Personal history of other malignant neoplasm of large intestine: Secondary | ICD-10-CM

## 2022-01-01 DIAGNOSIS — C189 Malignant neoplasm of colon, unspecified: Secondary | ICD-10-CM | POA: Diagnosis not present

## 2022-01-01 DIAGNOSIS — Z09 Encounter for follow-up examination after completed treatment for conditions other than malignant neoplasm: Secondary | ICD-10-CM | POA: Diagnosis not present

## 2022-01-01 NOTE — Progress Notes (Signed)
Clayton [name] , 10/06/1945, 76 y.o., male MRN: [id number] Patient Care Team    Relationship Specialty Notifications Start End  Ma Hillock, DO PCP - General Family Medicine  06/09/15   [name], MD PCP - Cardiology Cardiology  10/10/18   Constance Haw, MD PCP - Electrophysiology Cardiology Admissions 02/08/19   [name], MD Consulting Physician Cardiology  06/09/15   Warden Fillers, MD Consulting Physician Ophthalmology  06/09/15   Sharyne Peach, MD Consulting Physician Ophthalmology  06/09/15   Sydnee Levans, MD Consulting Physician Dermatology  06/09/15   Vicie Mutters, MD Consulting Physician Otolaryngology  06/09/15   Paralee Cancel, MD Consulting Physician Orthopedic Surgery  06/09/15   Ardis Hughs, MD Attending Physician Urology  07/27/21     Chief Complaint  Patient presents with   Hospitalization Follow-up     Subjective:  Clayton [name]  is a 76 y.o. male presents for hospital follow up after recent admission on 05/21/2021 for primary diagnosis adenocarcinoma/colonic mass-right laparoscopic hemicolectomy.  Patient was discharged on 12/24/2021 to home. Patients discharge summary has been reviewed, as well as all labs/image studies obtained during hospitalization.  Medication reconciliation completed today.  Patients hospital course: She had a positive Cologuard testing.  Went on to have a colonoscopy which identified a colonic mass.  Admitted on 12/21/2021 for right laparoscopic hemicolectomy.    COLON, RIGHT, RESECTION: P1N0 - Invasive moderately differentiated adenocarcinoma, 2.6 cm, involving  ascending colon  - Carcinoma invades into the submucosa  - Negative for lymphovascular invasion  - Resection margins are negative for carcinoma  - Benign unremarkable appendix  - Separate tubular adenoma without high-grade dysplasia  - Separate sessile serrated polyp without cytologic dysplasia   Since hospital discharge patient reports overall  he is doing well.  He is feeling well.  He is attempting to take it easy and allow himself to recuperate.  Eating and drinking his normal and bowel habits are normal.  He has appropriate follow-up scheduled with his specialty teams.  He is very thankful we urged him to have the Cologuard testing.  No results for input(s): "HGB", "HCT", "WBC", "PLT" in the last 168 hours.    Latest Ref Rng & Units 12/24/2021    1:26 PM 12/24/2021    5:16 AM 12/23/2021    4:57 AM  CMP  Glucose 70 - 99 mg/dL 107  106  112   BUN 8 - 23 mg/dL 37  30  23   Creatinine 0.61 - 1.24 mg/dL 1.57  1.62  1.52   Sodium 135 - 145 mmol/L 135  137  138   Potassium 3.5 - 5.1 mmol/L 4.5  4.8  4.8   Chloride 98 - 111 mmol/L 106  108  108   CO2 22 - 32 mmol/L '21  20  22   '$ Calcium 8.9 - 10.3 mg/dL 9.7  9.6  9.8       No results found.      01/01/2022   10:25 AM 07/27/2021    9:04 AM 07/04/2020    1:32 PM 11/01/2017    8:40 AM 06/09/2015   10:02 AM  Depression screen PHQ 2/9  Decreased Interest 0 0 0 1 0  Down, Depressed, Hopeless 0 0 0 0 0  PHQ - 2 Score 0 0 0 1 0  Altered sleeping    3   Tired, decreased energy    1   Change in appetite    0  Feeling bad or failure about yourself     0   Trouble concentrating    0   Moving slowly or fidgety/restless    0   Suicidal thoughts    0   PHQ-9 Score    5   Difficult doing work/chores    Not difficult at all     No Known Allergies Social History   Tobacco Use   Smoking status: Never   Smokeless tobacco: Never  Substance Use Topics   Alcohol use: No   Past Medical History:  Diagnosis Date   Aortic atherosclerosis ([place]) 12/21/2021   Arthritis    hips    Atrial fibrillation (HCC)    Cataract    Chronic diastolic heart failure ([place]) 12/21/2021   Cochlear Meniere syndrome of left ear 06/09/2015   CPAP (continuous positive airway pressure) dependence    Dysrhythmia    PAF- hx of 7 years ago    HLD (hyperlipidemia)    Hypertension 05/29/2010   ECHO-EF 67%  wnl   Meniere disease    Obesity    OSA on CPAP 08/20/2012   Palpitations    S/P right total hip arthroplasty 08/12/2020   Sleep apnea 09/14/2004   [facility]- Dr. Humphrey Rolls; CPAP titration 11/12/04- Dr. Humphrey Rolls   Past Surgical History:  Procedure Laterality Date   ATRIAL FIBRILLATION ABLATION N/A 09/10/2020   Procedure: ATRIAL FIBRILLATION ABLATION;  Surgeon: Constance Haw, MD;  Location: [facility];  Service: Cardiovascular;  Laterality: N/A;   CARDIAC CATHETERIZATION  11/03/2005   CARDIOVERSION N/A 09/26/2018   Procedure: CARDIOVERSION;  Surgeon: Geralynn Rile, MD;  Location: [facility];  Service: Endoscopy;  Laterality: N/A;   CARDIOVERSION N/A 03/13/2019   Procedure: CARDIOVERSION;  Surgeon: Skeet Latch, MD;  Location: [facility];  Service: Cardiovascular;  Laterality: N/A;   CARDIOVERSION N/A 03/03/2021   Procedure: CARDIOVERSION;  Surgeon: Josue Hector, MD;  Location: [facility];  Service: Cardiovascular;  Laterality: N/A;   CATARACT EXTRACTION  01/19/2012   KNEE ARTHROSCOPY     left knee- 2002    LAPAROSCOPIC PARTIAL RIGHT COLECTOMY Right 12/21/2021   Procedure: LAPAROSCOPIC RIGHT HEMICOLECTOMY;  Surgeon: Ileana Roup, MD;  Location: [facility];  Service: General;  Laterality: Right;   TOTAL HIP ARTHROPLASTY  06/08/2011   Procedure: TOTAL HIP ARTHROPLASTY ANTERIOR APPROACH;  Surgeon: [name], MD;  Location: [facility];  Service: Orthopedics;  Laterality: Left;   TOTAL HIP ARTHROPLASTY Right 08/12/2020   Procedure: TOTAL HIP ARTHROPLASTY ANTERIOR APPROACH;  Surgeon: Paralee Cancel, MD;  Location: [facility];  Service: Orthopedics;  Laterality: Right;   Family History  Problem Relation Age of Onset   Hypertension Mother    Stroke Mother    Diabetes Mother    Hyperlipidemia Mother    Obesity Mother    Arthritis Father    Heart disease Father    Diabetes Sister    Stroke Maternal Grandmother        51 died    Heart disease  Maternal Grandfather    Hypertension Maternal Grandfather    Diabetes Maternal Grandfather    Heart disease Paternal Grandfather    Allergies as of 01/01/2022   No Known Allergies      Medication List        Accurate as of January 01, 2022  4:47 PM. If you have any questions, ask your nurse or doctor.          amLODipine 5 MG tablet Commonly  known as: NORVASC Take 2 tablets (10 mg total) by mouth daily.   Coenzyme Q10 200 MG capsule Take 200 mg by mouth in the morning.   Eliquis 5 MG Tabs tablet Generic drug: apixaban TAKE 1 TABLET BY MOUTH TWICE A DAY   ezetimibe 10 MG tablet Commonly known as: ZETIA Take 1 tablet (10 mg total) by mouth in the morning.   ferrous sulfate 325 (65 FE) MG EC tablet Take 325 mg by mouth in the morning and at bedtime.   Livalo 2 MG Tabs Generic drug: Pitavastatin Calcium Take 1 tablet (2 mg total) by mouth every evening.   metFORMIN 500 MG tablet Commonly known as: GLUCOPHAGE Take 1 tablet (500 mg total) by mouth daily with breakfast.   metoprolol succinate 100 MG 24 hr tablet Commonly known as: TOPROL-XL Take 1 tablet (100 mg total) by mouth daily. What changed:  how much to take when to take this   multivitamin with minerals tablet Take 1 tablet by mouth daily.   spironolactone 25 MG tablet Commonly known as: ALDACTONE Take 1 tablet (25 mg) alternating every other day with 0.5 tablet (12.5 mg) What changed:  how much to take how to take this when to take this additional instructions   tamsulosin 0.4 MG Caps capsule Commonly known as: FLOMAX Take 0.4 mg by mouth in the morning.   traMADol 50 MG tablet Commonly known as: ULTRAM Take 1 tablet (50 mg total) by mouth every 6 (six) hours as needed (postop pain not controlled with tylenol first).   triamcinolone cream 0.5 % Commonly known as: KENALOG APPLY 1 APPLICATION TOPICALLY 2 (TWO) TIMES DAILY. TO AFFECTED AREAS.        All past medical history, surgical  history, allergies, family history, immunizations and medications were updated in the EMR today and reviewed under the history and medication portions of their EMR.      ROS: Negative, with the exception of above mentioned in HPI   Objective:  BP 130/75   Pulse 70   Temp 98 F (36.7 C) (Oral)   Ht '6\' 3"'$  (1.905 m)   Wt 263 lb (119.3 kg)   SpO2 99%   BMI 32.87 kg/m  Body mass index is 32.87 kg/m. Physical Exam Vitals and nursing note reviewed.  Constitutional:      General: He is not in acute distress.    Appearance: Normal appearance. He is not ill-appearing, toxic-appearing or diaphoretic.  HENT:     Head: Normocephalic and atraumatic.     Mouth/Throat:     Mouth: Mucous membranes are moist.  Eyes:     General: No scleral icterus.       Right eye: No discharge.        Left eye: No discharge.     Extraocular Movements: Extraocular movements intact.     Pupils: Pupils are equal, round, and reactive to light.  Cardiovascular:     Rate and Rhythm: Normal rate and regular rhythm.     Heart sounds: No murmur heard. Pulmonary:     Effort: Pulmonary effort is normal. No respiratory distress.     Breath sounds: Normal breath sounds. No wheezing, rhonchi or rales.  Musculoskeletal:     Cervical back: Neck supple.     Right lower leg: No edema.     Left lower leg: No edema.  Lymphadenopathy:     Cervical: No cervical adenopathy.  Skin:    General: Skin is warm and dry.     Coloration:  Skin is not jaundiced or pale.     Findings: No rash.  Neurological:     Mental Status: He is alert and oriented to person, place, and time. Mental status is at baseline.  Psychiatric:        Mood and Affect: Mood normal.        Behavior: Behavior normal.        Thought Content: Thought content normal.        Judgment: Judgment normal.    Assessment/Plan: CORY KITT is a 76 y.o. male present for OV for Hospital discharge follow up Adenocarcinoma, colon (HCC)/S/P right  hemicolectomy Has appropriate specialty team follow-up scheduled. He is doing great status post right laparoscopic hemicolectomy. He is grateful that cancer was caught early enough and that he was urged to have Cologuard testing. He is especially pleased with his health care teams. No need to repeat lab work today.  Recently discharged and labs look great then and patient is doing very well without any concerns or symptoms. Follow-up as needed  Reviewed expectations re: course of current medical issues. Discussed self-management of symptoms. Outlined signs and symptoms indicating need for more acute intervention. Patient verbalized understanding and all questions were answered. Patient received an After-Visit Summary. Any changes in medications were reviewed and patient was provided with updated med list with their AVS.     No orders of the defined types were placed in this encounter.    Note is dictated utilizing voice recognition software. Although note has been proof read prior to signing, occasional typographical errors still can be missed. If any questions arise, please do not hesitate to call for verification.   electronically signed by:  Howard Pouch, DO  [place]

## 2022-01-01 NOTE — Patient Instructions (Addendum)
Return if symptoms worsen or fail to improve.        Great to see you today.  You look awesome. Take time to heal.

## 2022-01-04 ENCOUNTER — Encounter: Payer: Self-pay | Admitting: Family Medicine

## 2022-01-04 ENCOUNTER — Encounter: Payer: Self-pay | Admitting: Cardiovascular Disease

## 2022-01-05 ENCOUNTER — Ambulatory Visit (INDEPENDENT_AMBULATORY_CARE_PROVIDER_SITE_OTHER): Payer: Medicare PPO | Admitting: Psychologist

## 2022-01-05 DIAGNOSIS — D485 Neoplasm of uncertain behavior of skin: Secondary | ICD-10-CM | POA: Diagnosis not present

## 2022-01-05 DIAGNOSIS — F411 Generalized anxiety disorder: Secondary | ICD-10-CM

## 2022-01-05 DIAGNOSIS — L820 Inflamed seborrheic keratosis: Secondary | ICD-10-CM | POA: Diagnosis not present

## 2022-01-05 DIAGNOSIS — D044 Carcinoma in situ of skin of scalp and neck: Secondary | ICD-10-CM | POA: Diagnosis not present

## 2022-01-05 NOTE — Progress Notes (Signed)
[facility] Counselor/Therapist Progress Note  Patient ID: Clayton [name], MRN: [id number],    Date: 01/05/2022  Time Spent: 09:02 am to 09:22 am; total time: 20 minutes  This session was held via video webex teletherapy due to the coronavirus risk at this time. The patient consented to video teletherapy and was located at his home during this session. He is aware it is the responsibility of the patient to secure confidentiality on his end of the session. The provider was in a private home office for the duration of this session. Limits of confidentiality were discussed with the patient.   Treatment Type: Individual Therapy  Reported Symptoms: Less distress following surgery to remove cancer  Mental Status Exam: Appearance:  Well Groomed     Behavior: Appropriate  Motor: Normal  Speech/Language:  Normal Rate  Affect: Appropriate  Mood: normal  Thought process: normal  Thought content:   WNL  Sensory/Perceptual disturbances:   WNL  Orientation: oriented to person  Attention: Good  Concentration: Good  Memory: WNL  Fund of knowledge:  Good  Insight:   Fair  Judgment:  Good  Impulse Control: Good   Risk Assessment: Danger to Self:  No Self-injurious Behavior: No Danger to Others: No Duty to Warn:no Physical Aggression / Violence:No  Access to Firearms a concern: No  Gang Involvement:No   Subjective:  The patient described himself as doing well while reflecting on the surgery indicating that it went well. He voiced that currently they got everything and that he does not need further treatment at this time. From there, he reflected on the recovery process and what that looks like to him moving forward. He processed thoughts and emotions. He asked to follow up. He denied suicidal and homicidal ideation.   Interventions: Worked on developing a therapeutic relationship with the patient using active listening and reflective statements. Provided emotional support  using empathy and validation. Reviewed events since the last session. Praised the patient for having positive news from the surgery. Reflected on the surgery and processed how the patient was feeling following the surgery. Used socratic questions to assist the patient. Processed the recovery process. Processed thoughts and emotions. Normalized and validated thoughts. Provided empathic statements. Assessed for suicidal and homicidal ideation  Homework: Continue using coping strategies  Next Session: Review homework and emotional support. Discuss losing weight to assist the patient.   Diagnosis: F41.1 generalized anxiety disorder.   Plan:    Goals Reduce overall frequency, intensity, and duration of anxiety Stabilize anxiety level wile increasing ability to function Enhance ability to effectively cope with full variety of stressors Learn and implement coping skills that result in a reduction of anxiety   Objectives target date for all objectives is 10/14/2022 Verbalize an understanding of the cognitive, physiological, and behavioral components of anxiety Learning and implement calming skills to reduce overall anxiety Verbalize an understanding of the role that cognitive biases play in excessive irrational worry and persistent anxiety symptoms Identify, challenge, and replace based fearful talk Learn and implement problem solving strategies Identify and engage in pleasant activities Learning and implement personal and interpersonal skills to reduce anxiety and improve interpersonal relationships Learn to accept limitations in life and commit to tolerating, rather than avoiding, unpleasant emotions while accomplishing meaningful goals Identify major life conflicts from the past and present that form the basis for present anxiety Maintain involvement in work, family, and social activities Reestablish a consistent sleep-wake cycle Cooperate with a medical evaluation  Interventions Engage the  patient  in behavioral activation Use instruction, modeling, and role-playing to build the client's general social, communication, and/or conflict resolution skills Use Acceptance and Commitment Therapy to help client accept uncomfortable realities in order to accomplish value-consistent goals Reinforce the client's insight into the role of his/her past emotional pain and present anxiety  Support the client in following through with work, family, and social activities Teach and implement sleep hygiene practices  Refer the patient to a physician for a psychotropic medication consultation Monito the clint's psychotropic medication compliance Discuss how anxiety typically involves excessive worry, various bodily expressions of tension, and avoidance of what is threatening that interact to maintain the problem  Teach the patient relaxation skills Assign the patient homework Discuss examples demonstrating that unrealistic worry overestimates the probability of threats and underestimates patient's ability  Assist the patient in analyzing his or her worries Help patient understand that avoidance is reinforcing   The patient reviewed the treatment plan on 10/08/2020. The patient reevaluated the treatment plan on 10/13/2021 and approved of it.   Conception Chancy, PsyD

## 2022-01-09 ENCOUNTER — Other Ambulatory Visit: Payer: Self-pay | Admitting: Cardiovascular Disease

## 2022-01-12 ENCOUNTER — Other Ambulatory Visit: Payer: Self-pay | Admitting: Cardiology

## 2022-01-21 ENCOUNTER — Encounter: Payer: Self-pay | Admitting: Family Medicine

## 2022-01-21 DIAGNOSIS — C4492 Squamous cell carcinoma of skin, unspecified: Secondary | ICD-10-CM | POA: Insufficient documentation

## 2022-01-21 HISTORY — DX: Squamous cell carcinoma of skin, unspecified: C44.92

## 2022-01-26 NOTE — Progress Notes (Unsigned)
[name] [place] Phone: [phone] Subjective:   Rito Ehrlich, am serving as a scribe for Dr. Hulan Saas. I'm seeing this patient by the request  of:  Kuneff, Renee A, DO  CC: Back and neck pain follow-up  ENI:[id number]  CHASEN MENDELL is a 77 y.o. male coming in with complaint of back and neck pain.OMT 12/07/2021. Patient states that he is ready to be worked on.  We have not been able to get any manipulation or anything else secondary to patient having the cancer removal of the colon.  Patient is feeling better overall though at this point.  Patient has been released to start activities to start working out.  Medications patient has been prescribed: None  Taking:         Reviewed prior external information including notes and imaging from previsou exam, outside providers and external EMR if available.   As well as notes that were available from care everywhere and other healthcare systems.  Past medical history, social, surgical and family history all reviewed in electronic medical record.  No pertanent information unless stated regarding to the chief complaint.   Past Medical History:  Diagnosis Date   Aortic atherosclerosis ([place]) 12/21/2021   Arthritis    hips    Atrial fibrillation (HCC)    Cataract    Chronic diastolic heart failure ([place]) 12/21/2021   Cochlear Meniere syndrome of left ear 06/09/2015   CPAP (continuous positive airway pressure) dependence    Dysrhythmia    PAF- hx of 7 years ago    HLD (hyperlipidemia)    Hypertension 05/29/2010   ECHO-EF 67% wnl   Meniere disease    Obesity    OSA on CPAP 08/20/2012   Palpitations    S/P right total hip arthroplasty 08/12/2020   Sleep apnea 09/14/2004   [facility]- Dr. Humphrey Rolls; CPAP titration 11/12/04- Dr. Humphrey Rolls    No Known Allergies   Review of Systems:  No headache, visual changes, nausea, vomiting, diarrhea,  constipation, dizziness, abdominal pain, skin rash, fevers, chills, night sweats, weight loss, swollen lymph nodes, body aches, joint swelling, chest pain, shortness of breath, mood changes. POSITIVE muscle aches  Objective  Blood pressure 130/84, pulse 81, height '6\' 3"'$  (1.905 m), weight 263 lb (119.3 kg), SpO2 98 %.   General: No apparent distress alert and oriented x3 mood and affect normal, dressed appropriately.  HEENT: Pupils equal, extraocular movements intact  Respiratory: Patient's speak in full sentences and does not appear short of breath  Cardiovascular: No lower extremity edema, non tender, no erythema  Low back exam does have some loss of lordosis.  Some tenderness to palpation in the paraspinal musculature.  Patient does have tightness with Corky Sox right greater than left.  Osteopathic findings  C5 flexed rotated and side bent left T3 extended rotated and side bent right inhaled rib T8 extended rotated and side bent left L2 flexed rotated and side bent right Sacrum right on right       Assessment and Plan:  Degenerative disc disease, lumbar Tightness noted  Loss of lordosis discussed HEP  Discussed which activities to do and which ones to avoid.  Discussed core strength  Rtc in 6 weeks      Nonallopathic problems  Decision today to treat with OMT was based on Physical Exam  After verbal consent patient was treated with HVLA, ME, FPR techniques in cervical, rib, thoracic, lumbar, and sacral  areas  Patient tolerated the procedure well with improvement in symptoms  Patient given exercises, stretches and lifestyle modifications  See medications in patient instructions if given  Patient will follow up in 4-8 weeks    The above documentation has been reviewed and is accurate and complete [name], DO          Note: This dictation was prepared with Dragon dictation along with smaller phrase technology. Any transcriptional errors that result from  this process are unintentional.

## 2022-02-01 ENCOUNTER — Ambulatory Visit: Payer: Medicare PPO | Admitting: Family Medicine

## 2022-02-01 VITALS — BP 130/84 | HR 81 | Ht 75.0 in | Wt 263.0 lb

## 2022-02-01 DIAGNOSIS — M5136 Other intervertebral disc degeneration, lumbar region: Secondary | ICD-10-CM | POA: Diagnosis not present

## 2022-02-01 DIAGNOSIS — M9902 Segmental and somatic dysfunction of thoracic region: Secondary | ICD-10-CM | POA: Diagnosis not present

## 2022-02-01 DIAGNOSIS — M9908 Segmental and somatic dysfunction of rib cage: Secondary | ICD-10-CM | POA: Diagnosis not present

## 2022-02-01 DIAGNOSIS — M9901 Segmental and somatic dysfunction of cervical region: Secondary | ICD-10-CM

## 2022-02-01 DIAGNOSIS — M9903 Segmental and somatic dysfunction of lumbar region: Secondary | ICD-10-CM

## 2022-02-01 DIAGNOSIS — M9904 Segmental and somatic dysfunction of sacral region: Secondary | ICD-10-CM

## 2022-02-01 NOTE — Patient Instructions (Addendum)
Good to see you  So glad you are doing so great  Follow up 6 weeks

## 2022-02-01 NOTE — Assessment & Plan Note (Signed)
Tightness noted  Loss of lordosis discussed HEP  Discussed which activities to do and which ones to avoid.  Discussed core strength  Rtc in 6 weeks

## 2022-02-04 ENCOUNTER — Ambulatory Visit (INDEPENDENT_AMBULATORY_CARE_PROVIDER_SITE_OTHER): Payer: Medicare PPO | Admitting: Family Medicine

## 2022-02-04 ENCOUNTER — Encounter (INDEPENDENT_AMBULATORY_CARE_PROVIDER_SITE_OTHER): Payer: Self-pay | Admitting: Family Medicine

## 2022-02-04 VITALS — BP 151/77 | HR 89 | Temp 98.0°F | Ht 75.0 in | Wt 261.0 lb

## 2022-02-04 DIAGNOSIS — Z6832 Body mass index (BMI) 32.0-32.9, adult: Secondary | ICD-10-CM | POA: Diagnosis not present

## 2022-02-04 DIAGNOSIS — E669 Obesity, unspecified: Secondary | ICD-10-CM

## 2022-02-04 DIAGNOSIS — D044 Carcinoma in situ of skin of scalp and neck: Secondary | ICD-10-CM | POA: Diagnosis not present

## 2022-02-04 DIAGNOSIS — R7303 Prediabetes: Secondary | ICD-10-CM

## 2022-02-09 NOTE — Progress Notes (Unsigned)
Chief Complaint:   OBESITY Clayton [name] is here to discuss his progress with his obesity treatment plan along with follow-up of his obesity related diagnoses. Arlo is on {MWMwtlossportion/plan2:23431} and states he is following his eating plan approximately ***% of the time. Hyde states he is *** *** minutes *** times per week.  Today's visit was #: *** Starting weight: *** Starting date: *** Today's weight: *** Today's date: 02/04/2022 Total lbs lost to date: *** Total lbs lost since last in-office visit: ***  Interim History: ***  Subjective:   1. Pre-diabetes ***  Assessment/Plan:   1. Pre-diabetes ***  2. BMI 32.0-32.9,adult  3. Obesity, Beginning BMI 35.09 Clayton [name] is currently in the action stage of change. As such, his goal is to continue with weight loss efforts. He has agreed to following a lower carbohydrate, vegetable and lean protein rich diet plan + berries with limited red meat and cheese.   Patient was educated on an antioxidant diet in depth.   Behavioral modification strategies: increasing lean protein intake.  Clayton [name] has agreed to follow-up with our clinic in 4 weeks. He was informed of the importance of frequent follow-up visits to maximize his success with intensive lifestyle modifications for his multiple health conditions.   Objective:   Blood pressure (!) 151/77, pulse 89, temperature 98 F (36.7 C), height '6\' 3"'$  (1.905 m), weight 261 lb (118.4 kg), SpO2 97 %. Body mass index is 32.62 kg/m.  General: Cooperative, alert, well developed, in no acute distress. HEENT: Conjunctivae and lids unremarkable. Cardiovascular: Regular rhythm.  Lungs: Normal work of breathing. Neurologic: No focal deficits.   Lab Results  Component Value Date   CREATININE 1.57 (H) 12/24/2021   BUN 37 (H) 12/24/2021   NA 135 12/24/2021   K 4.5 12/24/2021   CL 106 12/24/2021   CO2 21 (L) 12/24/2021   Lab Results  Component Value Date   ALT 20 10/26/2021   AST 21  10/26/2021   ALKPHOS 63 10/26/2021   BILITOT 0.7 10/26/2021   Lab Results  Component Value Date   HGBA1C 5.2 12/08/2021   HGBA1C 6.0 08/05/2021   HGBA1C 5.8 (H) 07/04/2020   HGBA1C 5.8 (H) 08/01/2019   HGBA1C 5.8 (H) 09/04/2018   Lab Results  Component Value Date   INSULIN 3.9 08/01/2019   INSULIN 9.0 09/04/2018   INSULIN 22.4 11/01/2017   Lab Results  Component Value Date   TSH 1.010 07/08/2021   Lab Results  Component Value Date   CHOL 113 08/05/2021   HDL 48.40 08/05/2021   LDLCALC 55 08/05/2021   TRIG 50.0 08/05/2021   CHOLHDL 2 08/05/2021   Lab Results  Component Value Date   VD25OH 33.80 08/05/2021   VD25OH 39.95 01/24/2020   VD25OH 37.6 08/01/2019   Lab Results  Component Value Date   WBC 6.9 12/24/2021   HGB 13.1 12/24/2021   HCT 40.7 12/24/2021   MCV 89.1 12/24/2021   PLT 184 12/24/2021   Lab Results  Component Value Date   IRON 23 (L) 10/26/2021   TIBC 501.2 (H) 10/26/2021   FERRITIN 7.4 (L) 10/26/2021   Attestation Statements:   Reviewed by clinician on day of visit: allergies, medications, problem list, medical history, surgical history, family history, social history, and previous encounter notes.  Time spent on visit including pre-visit chart review and post-visit care and charting was 45 minutes.   Wilhemena Durie, am acting as transcriptionist for Dennard Nip, MD.  I have reviewed the above documentation  for accuracy and completeness, and I agree with the above. -  ***

## 2022-02-10 ENCOUNTER — Ambulatory Visit (INDEPENDENT_AMBULATORY_CARE_PROVIDER_SITE_OTHER): Payer: Medicare PPO | Admitting: Psychologist

## 2022-02-10 DIAGNOSIS — F411 Generalized anxiety disorder: Secondary | ICD-10-CM

## 2022-02-10 NOTE — Progress Notes (Signed)
[facility] Counselor/Therapist Progress Note  Patient ID: Clayton [name], MRN: [id number],    Date: 02/10/2022  Time Spent: 10:05 am to 10:45 am; total time: 40 minutes  This session was held via video webex teletherapy due to the coronavirus risk at this time. The patient consented to video teletherapy and was located at his home during this session. He is aware it is the responsibility of the patient to secure confidentiality on his end of the session. The provider was in a private home office for the duration of this session. Limits of confidentiality were discussed with the patient.   Treatment Type: Individual Therapy  Reported Symptoms: Less distress   Mental Status Exam: Appearance:  Well Groomed     Behavior: Appropriate  Motor: Normal  Speech/Language:  Normal Rate  Affect: Appropriate  Mood: normal  Thought process: normal  Thought content:   WNL  Sensory/Perceptual disturbances:   WNL  Orientation: oriented to person  Attention: Good  Concentration: Good  Memory: WNL  Fund of knowledge:  Good  Insight:   Fair  Judgment:  Good  Impulse Control: Good   Risk Assessment: Danger to Self:  No Self-injurious Behavior: No Danger to Others: No Duty to Warn:no Physical Aggression / Violence:No  Access to Firearms a concern: No  Gang Involvement:No   Subjective:  The patient described himself as doing well reflecting on events since the last time. He spent time talking about focusing on losing weight. He processed thoughts and emotions. He asked to follow up. He denied suicidal and homicidal ideation.   Interventions: Worked on developing a therapeutic relationship with the patient using active listening and reflective statements. Provided emotional support using empathy and validation. Reviewed events since the last session. Reviewed events since the last session. Praised the patient for doing well and explored what has assisted the patient. Identified  goals for the session. Provided psychoeducation about nutrition. Used self-disclosure to assist the patient. Encouraged the patient to discuss nutrition concerns with medical provider and nutrionist. Discussed next steps for counseling. Normalized and validated thoughts. Provided empathic statements. Assessed for suicidal and homicidal ideation  Homework: NA  Next Session: Emotional support  Diagnosis: F41.1 generalized anxiety disorder.   Plan:    Goals Reduce overall frequency, intensity, and duration of anxiety Stabilize anxiety level wile increasing ability to function Enhance ability to effectively cope with full variety of stressors Learn and implement coping skills that result in a reduction of anxiety   Objectives target date for all objectives is 10/14/2022 Verbalize an understanding of the cognitive, physiological, and behavioral components of anxiety Learning and implement calming skills to reduce overall anxiety Verbalize an understanding of the role that cognitive biases play in excessive irrational worry and persistent anxiety symptoms Identify, challenge, and replace based fearful talk Learn and implement problem solving strategies Identify and engage in pleasant activities Learning and implement personal and interpersonal skills to reduce anxiety and improve interpersonal relationships Learn to accept limitations in life and commit to tolerating, rather than avoiding, unpleasant emotions while accomplishing meaningful goals Identify major life conflicts from the past and present that form the basis for present anxiety Maintain involvement in work, family, and social activities Reestablish a consistent sleep-wake cycle Cooperate with a medical evaluation  Interventions Engage the patient in behavioral activation Use instruction, modeling, and role-playing to build the client's general social, communication, and/or conflict resolution skills Use Acceptance and Commitment  Therapy to help client accept uncomfortable realities in order to accomplish value-consistent  goals Reinforce the client's insight into the role of his/her past emotional pain and present anxiety  Support the client in following through with work, family, and social activities Teach and implement sleep hygiene practices  Refer the patient to a physician for a psychotropic medication consultation Monito the clint's psychotropic medication compliance Discuss how anxiety typically involves excessive worry, various bodily expressions of tension, and avoidance of what is threatening that interact to maintain the problem  Teach the patient relaxation skills Assign the patient homework Discuss examples demonstrating that unrealistic worry overestimates the probability of threats and underestimates patient's ability  Assist the patient in analyzing his or her worries Help patient understand that avoidance is reinforcing   The patient reviewed the treatment plan on 10/08/2020. The patient reevaluated the treatment plan on 10/13/2021 and approved of it.   Conception Chancy, PsyD

## 2022-02-23 DIAGNOSIS — R3915 Urgency of urination: Secondary | ICD-10-CM | POA: Diagnosis not present

## 2022-02-23 DIAGNOSIS — N401 Enlarged prostate with lower urinary tract symptoms: Secondary | ICD-10-CM | POA: Diagnosis not present

## 2022-02-24 ENCOUNTER — Other Ambulatory Visit: Payer: Self-pay

## 2022-02-24 DIAGNOSIS — R7303 Prediabetes: Secondary | ICD-10-CM

## 2022-02-24 MED ORDER — METFORMIN HCL 500 MG PO TABS: 500.0000 mg | ORAL_TABLET | Freq: Every day | ORAL | 0 refills | Status: DC

## 2022-02-26 ENCOUNTER — Other Ambulatory Visit: Payer: Self-pay

## 2022-02-26 DIAGNOSIS — R7303 Prediabetes: Secondary | ICD-10-CM

## 2022-02-28 ENCOUNTER — Other Ambulatory Visit: Payer: Self-pay | Admitting: Cardiovascular Disease

## 2022-03-04 ENCOUNTER — Ambulatory Visit (INDEPENDENT_AMBULATORY_CARE_PROVIDER_SITE_OTHER): Payer: Medicare PPO | Admitting: Family Medicine

## 2022-03-05 ENCOUNTER — Ambulatory Visit: Payer: Medicare PPO | Admitting: Cardiology

## 2022-03-08 ENCOUNTER — Encounter: Payer: Self-pay | Admitting: Gastroenterology

## 2022-03-08 ENCOUNTER — Ambulatory Visit: Payer: Medicare PPO | Admitting: Gastroenterology

## 2022-03-08 VITALS — BP 140/88 | HR 88 | Ht 75.0 in | Wt 268.2 lb

## 2022-03-08 DIAGNOSIS — K648 Other hemorrhoids: Secondary | ICD-10-CM | POA: Diagnosis not present

## 2022-03-08 DIAGNOSIS — K5730 Diverticulosis of large intestine without perforation or abscess without bleeding: Secondary | ICD-10-CM | POA: Diagnosis not present

## 2022-03-08 DIAGNOSIS — Z85038 Personal history of other malignant neoplasm of large intestine: Secondary | ICD-10-CM

## 2022-03-08 NOTE — Patient Instructions (Signed)
_______________________________________________________  If your blood pressure at your visit was 140/90 or greater, please contact your primary care physician to follow up on this.  _______________________________________________________  If you are age 76 or older, your body mass index should be between 23-30. Your Body mass index is 33.53 kg/m. If this is out of the aforementioned range listed, please consider follow up with your Primary Care Provider.  If you are age 38 or younger, your body mass index should be between 19-25. Your Body mass index is 33.53 kg/m. If this is out of the aformentioned range listed, please consider follow up with your Primary Care Provider.   ________________________________________________________  The [facility] providers would like to encourage you to use [facility] to communicate with providers for non-urgent requests or questions.  Due to long hold times on the telephone, sending your provider a message by [facility] may be a faster and more efficient way to get a response.  Please allow 48 business hours for a response.  Please remember that this is for non-urgent requests.  _______________________________________________________  Follow up as needed.  You will be due for a recall colonoscopy in 12-2022. We will send you a reminder in the mail when it gets closer to that time.   It was a pleasure to see you today!  Thank you for trusting me with your gastrointestinal care!

## 2022-03-08 NOTE — Progress Notes (Signed)
[facility] Progress Note  Chief Complaint:  Chief Complaint  Patient presents with   Colonoscopy    Patient is here to discuss when to schedule his next Colonoscopy. Patient would also like to discuss hemorrhoids and diverticulosis found on last Colonoscopy.    Subjective  History: Clayton [name] follows up with me today after right and colectomy for ascending colon adenocarcinoma diagnosed on colonoscopy 10/21/2021 done for positive Cologuard study ordered by primary care. He underwent right hemicolectomy on 12/21/2021 by Dr. Nadeen Landau  He states that he has done very well postoperatively. He now has regular bowel movements, with a normal BM first thing in the mornings. He is primarily here today to discuss planning for his next colonoscopy as well as the results of his last colonoscopy. He is asymptomatic in regards to the hemorrhoids and diverticulosis noted on his colonoscopy.  He is working toward healthy weight reduction with a goal weight of 220lbs. He has been unable to resume his daily walking regimen due to his left knee issues, but is using his stationary bike. He started playing golf again.   ROS: Review of Systems  Constitutional:  Negative for appetite change and fever.  HENT:  Negative for trouble swallowing.   Respiratory:  Negative for cough and shortness of breath.   Cardiovascular:  Negative for chest pain.  Gastrointestinal:  Negative for abdominal distention, abdominal pain, anal bleeding, blood in stool, constipation, diarrhea, nausea, rectal pain and vomiting.  Genitourinary:  Negative for dysuria.  Musculoskeletal:  Negative for back pain.  Skin:  Negative for rash.  Neurological:  Negative for weakness.  All other systems reviewed and are negative.    The patient's Past Medical, Family and Social History were reviewed and are on file in the EMR.  Objective:  Med list reviewed  Current Outpatient Medications:    amLODipine (NORVASC) 5 MG  tablet, TAKE 1.5 TABLETS BY MOUTH EVERY DAY (Patient taking differently: Take 10 mg by mouth daily.), Disp: 135 tablet, Rfl: 2   Coenzyme Q10 200 MG capsule, Take 200 mg by mouth in the morning., Disp: , Rfl:    ELIQUIS 5 MG TABS tablet, TAKE 1 TABLET BY MOUTH TWICE A DAY, Disp: 180 tablet, Rfl: 1   ezetimibe (ZETIA) 10 MG tablet, Take 1 tablet (10 mg total) by mouth in the morning., Disp: 90 tablet, Rfl: 1   ferrous sulfate 325 (65 FE) MG EC tablet, Take 325 mg by mouth in the morning and at bedtime., Disp: , Rfl:    metFORMIN (GLUCOPHAGE) 500 MG tablet, Take 1 tablet (500 mg total) by mouth daily with breakfast., Disp: 30 tablet, Rfl: 0   metoprolol succinate (TOPROL-XL) 100 MG 24 hr tablet, Take 1 tablet (100 mg total) by mouth daily. (Patient taking differently: Take 50 mg by mouth in the morning and at bedtime.), Disp: 90 tablet, Rfl: 1   Multiple Vitamins-Minerals (MULTIVITAMIN WITH MINERALS) tablet, Take 1 tablet by mouth daily., Disp: , Rfl:    Pitavastatin Calcium (LIVALO) 2 MG TABS, Take 1 tablet (2 mg total) by mouth every evening., Disp: 90 tablet, Rfl: 1   spironolactone (ALDACTONE) 25 MG tablet, TAKE 1 TABLET ALTERNATING EVERY OTHER DAY WITH 0.5 TABLET, Disp: 135 tablet, Rfl: 1   tamsulosin (FLOMAX) 0.4 MG CAPS capsule, Take 0.4 mg by mouth in the morning., Disp: , Rfl:    Vital signs in last 24 hrs: Vitals:   03/08/22 1403  BP: (!) 140/88  Pulse: 88  SpO2: 96%  Wt Readings from Last 3 Encounters:  03/08/22 268 lb 4 oz (121.7 kg)  02/04/22 261 lb (118.4 kg)  02/01/22 263 lb (119.3 kg)     Physical Exam Well-appearing - -no additional exam.  Entire visit spent resulting record review and discussion.  Labs:  Positive cologuard 08/17/21   ___________________________________________ Radiologic studies:  CT Chest Abdomen Pelvis With Contrast 10/28/2021: IMPRESSION: 1. The ascending colonic primary is equivocally identified as detailed above. No dominant or obstructive  mass seen. 2. No findings of metastatic disease in the chest, abdomen, or pelvis. 3. Probable cholelithiasis 4. Coronary artery atherosclerosis. Aortic Atherosclerosis (ICD10-I70.0). 5. Degraded evaluation of the pelvis, secondary to beam hardening artifact from bilateral hip arthroplasty.   ____________________________________________ Other:  Colonoscopy 10/21/21: - The perianal and digital rectal examinations were normal. - A sessile non-obstructing mass was found in the mid ascending colon. The mass was noncircumferential. In addition, its diameter measured approximately forty mm. Biopsies were taken with a cold forceps for histology (firm and friable tissue). Area was tattooed with an injection of 1 mL of Spot - two 0.64m injections on opposite walls approx 5-7 cm distal to the mass (tattoos at the hepatic flexure). There were several small polyps close to the mass that were not removed. - Three sessile polyps were found in the distal transverse colon (1) and hepatic flexure (2). The polyps were 3 to 6 mm in size. These polyps were removed with a cold snare. Resection and retrieval were complete. - A 12-15 mm polyp was found in the descending colon. The polyp was pedunculated. The polyp was removed with a hot snare. Resection and retrieval were complete. - Multiple diverticula were found in the left colon. - Internal hemorrhoids were found.   . COLON, RIGHT, RESECTION 12/21/2021: - Invasive moderately differentiated adenocarcinoma, 2.6 cm, involving ascending colon - Carcinoma invades into the submucosa - Negative for lymphovascular invasion - Resection margins are negative for carcinoma - Benign unremarkable appendix - Separate tubular adenoma without high-grade dysplasia - Separate sessile serrated polyp without cytologic dysplasia - See oncology table   ONCOLOGY TABLE:   COLON AND RECTUM, CARCINOMA:  Resection, Including Transanal Disk Excision of Rectal Neoplasms  Procedure:  Resection, right colon Tumor Site: Ascending colon Tumor Size: 2.6 cm Macroscopic Tumor Perforation: Not identified Histologic Type: Adenocarcinoma Histologic Grade: G2: Moderately differentiated Multiple Primary Sites: Not applicable Tumor Extension: Carcinoma invades submucosa Lymphovascular Invasion: Not identified Perineural Invasion: Not identified Treatment Effect: No known presurgical therapy Margins:      Margin Status for Invasive Carcinoma: All margins negative for invasive carcinoma      Margin Status for Non-Invasive Tumor: All margins negative for high-grade dysplasia / intramucosal           carcinoma and low-grade dysplasia Regional Lymph Nodes:      Number of Lymph Nodes with Tumor: 0      Number of Lymph Nodes Examined: 16 Tumor Deposits: Not identified Distant Metastasis:      Distant Site(s) Involved: Not applicable Pathologic Stage Classification (pTNM, AJCC 8th Edition): pT1, pN0 Ancillary Studies: MMR / MSI testing will be ordered. Representative Tumor Block: A6 Comments: None     Also, pathology showed negative MMR IHC _____________________________________________ Assessment & Plan   Assessment: History of colon cancer  Diverticulosis of colon without hemorrhage  Internal hemorrhoids  Right-sided colon cancer discovered on Cologuard, early stage treated with surgery, no adjuvant therapy.  Asymptomatic colon diverticulosis and internal hemorrhoids.   Plan: Surveillance colonoscopy in 12/2022  See me in the interim as needed.  20 minutes were spent on this encounter (including chart review, history/exam, counseling/coordination of care, and documentation) > 50% of that time was spent on counseling and coordination of care.   Wilfrid Lund, MD    Velora Heckler Lanette Hampshire, [place], MD, have reviewed all documentation for this visit. The documentation on 03/08/22 for the exam, diagnosis, procedures, and orders are all accurate and  complete.   I,Alexis Herring,acting as a Education administrator for [place], MD.,have documented all relevant documentation on the behalf of Doran Stabler, MD,as directed by  Doran Stabler, MD while in the presence of Doran Stabler, MD.

## 2022-03-08 NOTE — Progress Notes (Deleted)
[facility] Progress Note  Chief Complaint: ***  Subjective  History: Clayton [name] follows up with me today after right and colectomy for ascending colon adenocarcinoma diagnosed on colonoscopy 10/21/2021 done for positive Cologuard study ordered by primary care. He underwent right hemicolectomy on 12/21/2021 by Dr. Nadeen Landau ***  ROS: Cardiovascular:  no chest pain Respiratory: no dyspnea  The patient's Past Medical, Family and Social History were reviewed and are on file in the EMR.  Objective:  Med list reviewed  Current Outpatient Medications:    amLODipine (NORVASC) 5 MG tablet, TAKE 1.5 TABLETS BY MOUTH EVERY DAY (Patient taking differently: Take 10 mg by mouth daily.), Disp: 135 tablet, Rfl: 2   Coenzyme Q10 200 MG capsule, Take 200 mg by mouth in the morning., Disp: , Rfl:    ELIQUIS 5 MG TABS tablet, TAKE 1 TABLET BY MOUTH TWICE A DAY, Disp: 180 tablet, Rfl: 1   ezetimibe (ZETIA) 10 MG tablet, Take 1 tablet (10 mg total) by mouth in the morning., Disp: 90 tablet, Rfl: 1   ferrous sulfate 325 (65 FE) MG EC tablet, Take 325 mg by mouth in the morning and at bedtime., Disp: , Rfl:    metFORMIN (GLUCOPHAGE) 500 MG tablet, Take 1 tablet (500 mg total) by mouth daily with breakfast., Disp: 30 tablet, Rfl: 0   metoprolol succinate (TOPROL-XL) 100 MG 24 hr tablet, Take 1 tablet (100 mg total) by mouth daily. (Patient taking differently: Take 50 mg by mouth in the morning and at bedtime.), Disp: 90 tablet, Rfl: 1   Multiple Vitamins-Minerals (MULTIVITAMIN WITH MINERALS) tablet, Take 1 tablet by mouth daily., Disp: , Rfl:    Pitavastatin Calcium (LIVALO) 2 MG TABS, Take 1 tablet (2 mg total) by mouth every evening., Disp: 90 tablet, Rfl: 1   spironolactone (ALDACTONE) 25 MG tablet, TAKE 1 TABLET ALTERNATING EVERY OTHER DAY WITH 0.5 TABLET, Disp: 135 tablet, Rfl: 1   tamsulosin (FLOMAX) 0.4 MG CAPS capsule, Take 0.4 mg by mouth in the morning., Disp: , Rfl:    Vital signs  in last 24 hrs: There were no vitals filed for this visit. Wt Readings from Last 3 Encounters:  02/04/22 261 lb (118.4 kg)  02/01/22 263 lb (119.3 kg)  01/01/22 263 lb (119.3 kg)    Physical Exam  *** HEENT: sclera anicteric, oral mucosa moist without lesions Neck: supple, no thyromegaly, JVD or lymphadenopathy Cardiac: ***,  no peripheral edema Pulm: clear to auscultation bilaterally, normal RR and effort noted Abdomen: soft, *** tenderness, with active bowel sounds. No guarding or palpable hepatosplenomegaly. Skin; warm and dry, no jaundice or rash  Labs:   ___________________________________________ Radiologic studies:   ____________________________________________ Other: A. COLON, RIGHT, RESECTION: - Invasive moderately differentiated adenocarcinoma, 2.6 cm, involving ascending colon - Carcinoma invades into the submucosa - Negative for lymphovascular invasion - Resection margins are negative for carcinoma - Benign unremarkable appendix - Separate tubular adenoma without high-grade dysplasia - Separate sessile serrated polyp without cytologic dysplasia - See oncology table      ONCOLOGY TABLE:   COLON AND RECTUM, CARCINOMA:  Resection, Including Transanal Disk Excision of Rectal Neoplasms  Procedure: Resection, right colon Tumor Site: Ascending colon Tumor Size: 2.6 cm Macroscopic Tumor Perforation: Not identified Histologic Type: Adenocarcinoma Histologic Grade: G2: Moderately differentiated Multiple Primary Sites: Not applicable Tumor Extension: Carcinoma invades submucosa Lymphovascular Invasion: Not identified Perineural Invasion: Not identified Treatment Effect: No known presurgical therapy Margins:      Margin Status for Invasive Carcinoma:  All margins negative for invasive carcinoma      Margin Status for Non-Invasive Tumor: All margins negative for high-grade dysplasia / intramucosal           carcinoma and low-grade dysplasia Regional Lymph  Nodes:      Number of Lymph Nodes with Tumor: 0      Number of Lymph Nodes Examined: 16 Tumor Deposits: Not identified Distant Metastasis:      Distant Site(s) Involved: Not applicable Pathologic Stage Classification (pTNM, AJCC 8th Edition): pT1, pN0 Ancillary Studies: MMR / MSI testing will be ordered. Representative Tumor Block: A6 Comments: None   Also, pathology showed negative MMR IHC  _____________________________________________ Assessment & Plan  Assessment: No diagnosis found.    Plan:   *** minutes were spent on this encounter (including chart review, history/exam, counseling/coordination of care, and documentation) > 50% of that time was spent on counseling and coordination of care.   Nelida Meuse III

## 2022-03-09 ENCOUNTER — Ambulatory Visit: Payer: Medicare PPO | Admitting: Psychologist

## 2022-03-15 ENCOUNTER — Ambulatory Visit: Payer: Medicare PPO | Admitting: Family Medicine

## 2022-03-15 ENCOUNTER — Ambulatory Visit (INDEPENDENT_AMBULATORY_CARE_PROVIDER_SITE_OTHER): Payer: Medicare PPO | Admitting: Psychologist

## 2022-03-15 VITALS — BP 132/86 | HR 80 | Ht 75.0 in | Wt 268.0 lb

## 2022-03-15 DIAGNOSIS — F411 Generalized anxiety disorder: Secondary | ICD-10-CM | POA: Diagnosis not present

## 2022-03-15 DIAGNOSIS — M170 Bilateral primary osteoarthritis of knee: Secondary | ICD-10-CM | POA: Diagnosis not present

## 2022-03-15 DIAGNOSIS — M5136 Other intervertebral disc degeneration, lumbar region: Secondary | ICD-10-CM

## 2022-03-15 DIAGNOSIS — M9903 Segmental and somatic dysfunction of lumbar region: Secondary | ICD-10-CM | POA: Diagnosis not present

## 2022-03-15 DIAGNOSIS — M9902 Segmental and somatic dysfunction of thoracic region: Secondary | ICD-10-CM | POA: Diagnosis not present

## 2022-03-15 DIAGNOSIS — M9901 Segmental and somatic dysfunction of cervical region: Secondary | ICD-10-CM | POA: Diagnosis not present

## 2022-03-15 DIAGNOSIS — M9904 Segmental and somatic dysfunction of sacral region: Secondary | ICD-10-CM | POA: Diagnosis not present

## 2022-03-15 DIAGNOSIS — M9908 Segmental and somatic dysfunction of rib cage: Secondary | ICD-10-CM

## 2022-03-15 NOTE — Patient Instructions (Addendum)
Injection in knees today Good to see you! See you again in 6 weeks

## 2022-03-15 NOTE — Assessment & Plan Note (Signed)
Mild worsening pain likely compensating for some of the discomfort and pain.  Discussed which activities to do and which ones to avoid.  Increase activity slowly.  Follow-up again in 6 to 8 weeks

## 2022-03-15 NOTE — Progress Notes (Signed)
[place] Counselor/Therapist Progress Note  Patient ID: LOMAN ISMAILI, MRN: [id number],    Date: 03/15/2022  Time Spent: 09:04 am to 09:30 am; total time: 26 minutes  This session This session was held via in person. The patient consented to in-person therapy and was in the clinician's office. Limits of confidentiality were discussed with the patient.    Treatment Type: Individual Therapy  Reported Symptoms: Less distress   Mental Status Exam: Appearance:  Well Groomed     Behavior: Appropriate  Motor: Normal  Speech/Language:  Normal Rate  Affect: Appropriate  Mood: normal  Thought process: normal  Thought content:   WNL  Sensory/Perceptual disturbances:   WNL  Orientation: oriented to person  Attention: Good  Concentration: Good  Memory: WNL  Fund of knowledge:  Good  Insight:   Fair  Judgment:  Good  Impulse Control: Good   Risk Assessment: Danger to Self:  No Self-injurious Behavior: No Danger to Others: No Duty to Warn:no Physical Aggression / Violence:No  Access to Firearms a concern: No  Gang Involvement:No   Subjective:  The patient described himself as doing well while talking about his weekend away. From there, he indicated that he was in a state of transition for therapy, wanting to focus on the etiology and root cause of anxiety. He also stated understanding regarding transition for the clinician. He processed thoughts and emotions while discussing next steps. He asked to follow up. He denied suicidal and homicidal ideation.   Interventions: Worked on developing a therapeutic relationship with the patient using active listening and reflective statements. Provided emotional support using empathy and validation. Reviewed events since the last session. Used summary statements. Praised the patient for doing well and explored what has assisted the patient. Identified the goals for the session. Reviewed interaction with Dr. Leafy Ro. Used socratic  questions to assist the patient. Identified the theme of transition. Disclosed to patient regarding leaving healthcare organization. Processed thoughts and emotions. Validated thoughts and emotions. Discussed options for the patient moving forward. Answered questions. Provided empathic statements. Assessed for suicidal and homicidal ideation  Homework: NA  Next Session: Emotional support. Discuss transition and termination  Diagnosis: F41.1 generalized anxiety disorder.   Plan:    Goals Reduce overall frequency, intensity, and duration of anxiety Stabilize anxiety level wile increasing ability to function Enhance ability to effectively cope with full variety of stressors Learn and implement coping skills that result in a reduction of anxiety   Objectives target date for all objectives is 10/14/2022 Verbalize an understanding of the cognitive, physiological, and behavioral components of anxiety Learning and implement calming skills to reduce overall anxiety Verbalize an understanding of the role that cognitive biases play in excessive irrational worry and persistent anxiety symptoms Identify, challenge, and replace based fearful talk Learn and implement problem solving strategies Identify and engage in pleasant activities Learning and implement personal and interpersonal skills to reduce anxiety and improve interpersonal relationships Learn to accept limitations in life and commit to tolerating, rather than avoiding, unpleasant emotions while accomplishing meaningful goals Identify major life conflicts from the past and present that form the basis for present anxiety Maintain involvement in work, family, and social activities Reestablish a consistent sleep-wake cycle Cooperate with a medical evaluation  Interventions Engage the patient in behavioral activation Use instruction, modeling, and role-playing to build the client's general social, communication, and/or conflict resolution  skills Use Acceptance and Commitment Therapy to help client accept uncomfortable realities in order to accomplish value-consistent goals Reinforce  the client's insight into the role of his/her past emotional pain and present anxiety  Support the client in following through with work, family, and social activities Teach and implement sleep hygiene practices  Refer the patient to a physician for a psychotropic medication consultation Monito the clint's psychotropic medication compliance Discuss how anxiety typically involves excessive worry, various bodily expressions of tension, and avoidance of what is threatening that interact to maintain the problem  Teach the patient relaxation skills Assign the patient homework Discuss examples demonstrating that unrealistic worry overestimates the probability of threats and underestimates patient's ability  Assist the patient in analyzing his or her worries Help patient understand that avoidance is reinforcing   The patient reviewed the treatment plan on 10/08/2020. The patient reevaluated the treatment plan on 10/13/2021 and approved of it.   Conception Chancy, PsyD

## 2022-03-15 NOTE — Progress Notes (Signed)
[facility] [place] Phone: [phone] Subjective:   [name], am serving as [name] scribe for Dr. Hulan Saas.  I'm seeing this patient by the request  of:  Kuneff, Renee A, DO  CC: back and neck pain   [id number]  Clayton [name] is [name] 77 y.o. male coming in with complaint of back and neck pain. OMT On 02/01/2022. Patient states that his knee pain has been increasing recently. Using TENS at night. Right knee is also starting to bother him. Fees like his strength is diminishing in L leg.   Back and neck are doing well.  Has had some tightness recently.  Affecting some daily activities.         Reviewed prior external information including notes and imaging from previsou exam, outside providers and external EMR if available.   As well as notes that were available from care everywhere and other healthcare systems.  Past medical history, social, surgical and family history all reviewed in electronic medical record.  No pertanent information unless stated regarding to the chief complaint.   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 Humphrey Rolls; CPAP titration 11/12/04- Dr. Humphrey Rolls    No Known Allergies   Review of Systems:  No headache, visual changes, nausea, vomiting, diarrhea, constipation, dizziness, abdominal pain, skin rash, fevers, chills, night sweats, weight loss,  swollen lymph nodes, body aches, joint swelling, chest pain, shortness of breath, mood changes. POSITIVE muscle aches  Objective  Blood pressure 132/86, pulse 80, height '6\' 3"'$  (1.905 m), weight 268 lb (121.6 kg), SpO2 97 %.   General: No apparent distress alert and oriented x3 mood and affect normal, dressed appropriately.  HEENT: Pupils equal, extraocular movements intact  Respiratory: Patient's speak in full sentences and does not appear short of breath  Cardiovascular: No lower extremity edema, non tender, no erythema  Low back does have some loss of lordosis noted.  Some tenderness to palpation in the paraspinal musculature.  Patient does have some limited sidebending bilaterally. Knee exams bilaterally do show the patient has trace effusion noted.  No crepitus noted with motion.  More effusion noted on the left greater than the right.  Osteopathic findings  C6 flexed rotated and side bent right  T6 extended rotated and side bent right inhaled rib L1 flexed rotated and side bent right Sacrum right on right  After informed written and verbal consent, patient was seated on exam table. Right knee was prepped with alcohol swab and utilizing anterolateral approach, patient's right knee space was injected with 4:1  marcaine 0.5%: Kenalog '40mg'$ /dL. Patient tolerated the procedure well without immediate complications.  After informed written and verbal consent, patient was seated on exam table. Left knee was prepped with alcohol swab and utilizing anterolateral approach, patient's left knee space  was injected with 4:1  marcaine 0.5%: Kenalog '40mg'$ /dL. Patient tolerated the procedure well without immediate complications.     Assessment and Plan:  Degenerative arthritis of knee, bilateral Bilateral injections given today, tolerated the procedure well, discussed icing regimen and home exercise, which activities.  Which ones to avoid follow-up with me again in 6 to 8 weeks.  Patient wants to avoid  any other surgical intervention.  I think that this will be highly likely.  Continue to work on weight loss.  Follow-up again in 6 to 8 weeks  Degenerative disc disease, lumbar Mild worsening pain likely compensating for some of the discomfort and pain.  Discussed which activities to do and which ones to avoid.  Increase activity slowly.  Follow-up again in 6 to 8 weeks    Nonallopathic problems  Decision today to treat with OMT was based on Physical Exam  After verbal consent patient was treated with HVLA, ME, FPR techniques in cervical, rib, thoracic, lumbar, and sacral  areas  Patient tolerated the procedure well with improvement in symptoms  Patient given exercises, stretches and lifestyle modifications  See medications in patient instructions if given  Patient will follow up in 4-8 weeks     The above documentation has been reviewed and is accurate and complete [name], DO         Note: This dictation was prepared with Dragon dictation along with smaller phrase technology. Any transcriptional errors that result from this process are unintentional.

## 2022-03-15 NOTE — Assessment & Plan Note (Signed)
Bilateral injections given today, tolerated the procedure well, discussed icing regimen and home exercise, which activities.  Which ones to avoid follow-up with me again in 6 to 8 weeks.  Patient wants to avoid any other surgical intervention.  I think that this will be highly likely.  Continue to work on weight loss.  Follow-up again in 6 to 8 weeks

## 2022-03-22 ENCOUNTER — Other Ambulatory Visit: Payer: Self-pay | Admitting: Family Medicine

## 2022-03-22 DIAGNOSIS — R7303 Prediabetes: Secondary | ICD-10-CM

## 2022-03-23 ENCOUNTER — Ambulatory Visit (INDEPENDENT_AMBULATORY_CARE_PROVIDER_SITE_OTHER): Payer: Medicare PPO | Admitting: Family Medicine

## 2022-03-24 ENCOUNTER — Ambulatory Visit (INDEPENDENT_AMBULATORY_CARE_PROVIDER_SITE_OTHER): Payer: Medicare PPO | Admitting: Psychologist

## 2022-03-24 DIAGNOSIS — F411 Generalized anxiety disorder: Secondary | ICD-10-CM | POA: Diagnosis not present

## 2022-03-24 NOTE — Progress Notes (Signed)
[place] Counselor/Therapist Progress Note  Patient ID: BRYOR STRIANO, MRN: [id number],    Date: 03/24/2022  Time Spent: 09:03 am to 09:30 am; total time: 27 minutes  This session This session was held via in person. The patient consented to in-person therapy and was in the clinician's office. Limits of confidentiality were discussed with the patient.    Treatment Type: Individual Therapy  Reported Symptoms: Less distress   Mental Status Exam: Appearance:  Well Groomed     Behavior: Appropriate  Motor: Normal  Speech/Language:  Normal Rate  Affect: Appropriate  Mood: normal  Thought process: normal  Thought content:   WNL  Sensory/Perceptual disturbances:   WNL  Orientation: oriented to person  Attention: Good  Concentration: Good  Memory: WNL  Fund of knowledge:  Good  Insight:   Fair  Judgment:  Good  Impulse Control: Good   Risk Assessment: Danger to Self:  No Self-injurious Behavior: No Danger to Others: No Duty to Warn:no Physical Aggression / Violence:No  Access to Firearms a concern: No  Gang Involvement:No   Subjective:  The patient described himself as doing well and denied any concerns. Patient spent the session reflecting on the therapeutic relationship and how it assisted him. He processed thoughts and emotions. He terminated with this therapist to transition to a new therapist. He denied suicidal and homicidal ideation.   Interventions: Worked on developing a therapeutic relationship with the patient using active listening and reflective statements. Provided emotional support using empathy and validation. Reviewed events since the last session. Processed upcoming events. Normalized and validated thoughts and emotions. Processed the therapeutic relationship and how it assisted the patient. Reflected on patient's growth and how therapy facilitated patient looking at stress from a different perspective. Reflected on how therapy assisted him  through different challenges in 2023. Discussed next steps. Provided empathic statements. Assessed for suicidal and homicidal ideation  Homework: NA  Next Session: NA. Patient will transition to start seeing a new therapist.   Diagnosis: F41.1 generalized anxiety disorder.   Plan:    Goals Reduce overall frequency, intensity, and duration of anxiety Stabilize anxiety level wile increasing ability to function Enhance ability to effectively cope with full variety of stressors Learn and implement coping skills that result in a reduction of anxiety   Objectives target date for all objectives is 10/14/2022 Verbalize an understanding of the cognitive, physiological, and behavioral components of anxiety Learning and implement calming skills to reduce overall anxiety Verbalize an understanding of the role that cognitive biases play in excessive irrational worry and persistent anxiety symptoms Identify, challenge, and replace based fearful talk Learn and implement problem solving strategies Identify and engage in pleasant activities Learning and implement personal and interpersonal skills to reduce anxiety and improve interpersonal relationships Learn to accept limitations in life and commit to tolerating, rather than avoiding, unpleasant emotions while accomplishing meaningful goals Identify major life conflicts from the past and present that form the basis for present anxiety Maintain involvement in work, family, and social activities Reestablish a consistent sleep-wake cycle Cooperate with a medical evaluation  Interventions Engage the patient in behavioral activation Use instruction, modeling, and role-playing to build the client's general social, communication, and/or conflict resolution skills Use Acceptance and Commitment Therapy to help client accept uncomfortable realities in order to accomplish value-consistent goals Reinforce the client's insight into the role of his/her past  emotional pain and present anxiety  Support the client in following through with work, family, and social activities Teach and  implement sleep hygiene practices  Refer the patient to a physician for a psychotropic medication consultation Monito the clint's psychotropic medication compliance Discuss how anxiety typically involves excessive worry, various bodily expressions of tension, and avoidance of what is threatening that interact to maintain the problem  Teach the patient relaxation skills Assign the patient homework Discuss examples demonstrating that unrealistic worry overestimates the probability of threats and underestimates patient's ability  Assist the patient in analyzing his or her worries Help patient understand that avoidance is reinforcing   The patient reviewed the treatment plan on 10/08/2020. The patient reevaluated the treatment plan on 10/13/2021 and approved of it.   Conception Chancy, PsyD

## 2022-04-07 DIAGNOSIS — G4733 Obstructive sleep apnea (adult) (pediatric): Secondary | ICD-10-CM | POA: Diagnosis not present

## 2022-04-12 ENCOUNTER — Ambulatory Visit: Payer: Medicare PPO | Attending: Cardiovascular Disease | Admitting: Cardiovascular Disease

## 2022-04-12 VITALS — BP 158/98 | HR 88 | Ht 75.0 in | Wt 268.0 lb

## 2022-04-12 DIAGNOSIS — I10 Essential (primary) hypertension: Secondary | ICD-10-CM | POA: Diagnosis not present

## 2022-04-12 DIAGNOSIS — Z7901 Long term (current) use of anticoagulants: Secondary | ICD-10-CM

## 2022-04-12 DIAGNOSIS — I480 Paroxysmal atrial fibrillation: Secondary | ICD-10-CM | POA: Diagnosis not present

## 2022-04-12 DIAGNOSIS — R7303 Prediabetes: Secondary | ICD-10-CM | POA: Diagnosis not present

## 2022-04-12 DIAGNOSIS — M25473 Effusion, unspecified ankle: Secondary | ICD-10-CM

## 2022-04-12 DIAGNOSIS — E782 Mixed hyperlipidemia: Secondary | ICD-10-CM

## 2022-04-12 DIAGNOSIS — E669 Obesity, unspecified: Secondary | ICD-10-CM | POA: Diagnosis not present

## 2022-04-12 DIAGNOSIS — G4733 Obstructive sleep apnea (adult) (pediatric): Secondary | ICD-10-CM | POA: Diagnosis not present

## 2022-04-12 MED ORDER — AMLODIPINE BESYLATE 10 MG PO TABS: 10.0000 mg | ORAL_TABLET | Freq: Every day | ORAL | 3 refills | Status: DC

## 2022-04-12 MED ORDER — METOPROLOL SUCCINATE ER 50 MG PO TB24: ORAL_TABLET | ORAL | 3 refills | Status: DC

## 2022-04-12 MED ORDER — SPIRONOLACTONE 25 MG PO TABS: 25.0000 mg | ORAL_TABLET | Freq: Every day | ORAL | 1 refills | Status: DC

## 2022-04-12 NOTE — Patient Instructions (Addendum)
Medication Instructions:    Change in the following medications   Increase Amlodipine 10 mg daily   Increase Metoprolol Succinate  add an additional  50 mg ( 1 and 1/2 tablet= total  75 mg ) in the morning  and 50 mg in the evening   Increase Spironolactone 25 mg  ( whole tablet) daily    *If you need a refill on your cardiac medications before your next appointment, please call your pharmacy*   Lab Work: in 2 weeks  fasting LIPID CMP CBC TSH   If you have labs (blood work) drawn today and your tests are completely normal, you will receive your results only by: [place] (if you have MyChart) OR A paper copy in the mail If you have any lab test that is abnormal or we need to change your treatment, we will call you to review the results.   Testing/Procedures: Not needed   Follow-Up: At [facility], you and your health needs are our priority.  As part of our continuing mission to provide you with exceptional heart care, we have created designated Provider Care Teams.  These Care Teams include your primary Cardiologist (physician) and Advanced Practice Providers (APPs -  Physician Assistants and Nurse Practitioners) who all work together to provide you with the care you need, when you need it.     Your next appointment:   2 to 3 month(s)  The format for your next appointment:   In Person  Provider:   Shelva Majestic, MD    Other Instructions

## 2022-04-12 NOTE — Progress Notes (Signed)
Patient ID: Clayton [name], male   DOB: [birth year], 77 y.o.   MRN: [id number]        HPI: [name] is a 77 y.o. male who presents for a 4 month follow-up cardiology evaluation.  [name] has a history of hypertension, a remote history of PAF, status post cardioversion in 2007, obstructive sleep apnea for which he uses CPAP 100% of the time, hypertension, and hyperlipidemia. He has had weight fluctuations over the years.  In the past, he also had a history of Mnire's disease.  There was some concern by [name] that perhaps this may have been related to statin use, which led to its discontinuance.  He is not had Mnire's disease in some time.  He underwent left hip replacement surgery by Dr. [name] and now has been able to continue to be active and exercises routinely.   His  blood pressure regimen has been amlodipine/benazepril 10/40 daily, hydrochlorothiazide has been taking 25 Mill grams in the evening and Toprol-XL 100 mg daily. He had atrial of livalo  For hyperlipidemia but developed some diarrhea secondary to this.    He has not had much success with weight loss over the past several years.    He continues to use his CPAP therapy with 100% compliance.  He will not even take a nap without his CPAP therapy and he takes his CPAP unit on alll his travels.   Since initiating CPAP therapy, he is unaware of any palpitations.  He denies any recurrent atrial fibrillation.  In the past he has had difficulty with family stress and his father had passed away, and his wife's mother recently died after having developed lung CA and had significant PVD.  He was not exercising as much as he had in the past.  He has not been successful in weight loss.  He continues to be active and still walks 3-4 miles per day.  Oftentimes in the morning when he awakens his blood pressure is elevated at 150/90 but after exercise it drops to 128/80.  He is unaware of any recurrent atrial fibrillation.  He  continues to use CPAP with 100% compliance.   He completed a two-year grieving process.  On 01/19/2016 he committed to weight loss.  On January 1, he weighed 278 pounds .  He has been walking 4 miles 5 days per week as well as some intermittent stationary bike.  He has lost a total of 32 pounds since January.  He continues to use CPAP with 100% compliance and since he has been on CPAP, he has not had any recurrent atrial fibrillation and his blood pressure has been less labile.  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  He has had failures to Crestor and Lipitor in the past due to significant myalgias, development, even with weekly dosing.  I resumed zetia   In August 2018 he noticed his heart rate increasing and he presented for evaluation and was seen by [name], PAC.  He admitted to a rare palpitation.  He denied any chest pain with exertion or dyspnea on exertion.  He has suffered a broken rib.  This year.  He has a weight goal of 220 pounds.    I saw him in September 2018, at which time he was doing well from a cardiovascular standpoint.   His father was ill and ultimately passed away in [place].  As result he was going back and forth to the [facility] area.  This resulted in a change in his diet and ability to exercise.  I last saw him in October 2019 and his weight had increased from 240 back up to 270 pounds.  He was unaware of any episodes of atrial fibrillation.  He was using CPAP with 100% compliance along with his naps.  He was committed to begin weight loss again.    Since I saw him on October 19, 2018 he has had major lifestyle change.  He has been working with Dr. Leafy Ro in the healthy weight and wellness group.  Peak weight was 280 and most recent weight 244.  I saw him for a cardiology evaluation on August 09, 2018.  O ver the past 4 months, he had been under increased stress as result of his wife's illness.  He has been checking his blood pressure regularly and this has  been stable but on his blood pressure recording he has noticed the message of heart rate irregularity.  He is unaware of A. fib but has noticed this message for the last 3 to 4 months.  He is exercising every day and typically walks 2 miles in the morning and in the afternoon or evening does 30 minutes of stationary bike at least 5 to 6 days/week.  He continues to use CPAP.  I obtained a download in the office today from June 22 through August 08, 2018.  He is 100% compliant.  He has a ResMed air sense 10 auto unit with a pressure range of 8-20 with 95% pressure at 10.1 and maximum average and 11.5.  AHI is excellent at 1.2.     During his August 09, 2018 encounter his ECG demonstrated that he was back in atrial fibrillation and had a ventricular rate at 89 bpm.  There was LVH with repolarization changes.  At that time, I recommended discontinuance of amlodipine/benazepril combination and in its place started Cardizem CD 180 mg rather than amlodipine and olmesartan 20 mg rather than benazepril.  I initiated anticoagulation with Eliquis 5 mg twice a day.  He underwent a 2D echo Doppler study on August 16, 2018 which showed EF low normal at 50 to 55%.  There was evidence for significant LVH and moderate dilation of his left atrium.  I saw him for follow-up evaluation on August 23, 2018.  At that time he was now cognizant of his heart rate irregularity.  He has continued to use CPAP with excellent compliance.  A new download was obtained from July 5 through August 21, 2018 which shows 100% compliance with average usage 7 hours and 28 minutes.  AHI is 1.6 and his 95th percentile auto pressure is 10.1 with a maximum average pressure of 11.4.  He states he has lost weight over the past several weeks purposefully.  He continues to exercise now on a stationary bike.  He denies any chest pain PND orthopnea.  He was tolerating Eliquis without bleeding.  During that evaluation, we discussed different options including several  additional weeks of increased weight control versus initiating antiarrhythmic therapy.  Since his only other episode of atrial fibrillation previously occurred 13 years ago and he had not had any recurrence until this year he opted for an increased rate control trial.  As result metoprolol dose was further titrated.  I saw on September 15, 2018 for follow-up evaluation.  At that time remained in atrial fibrillation despite his increase metoprolol succinate to 100 mg in the morning and 50 mg at night with continuation of  diltiazem 180 mg, spironolactone 12.5 mg daily in addition to his olmesartan 20 mg.  I scheduled him for DC cardioversion but because of the need to obtain a COVID test I was unable to schedule this to be done by me prior to going on vacation.  He ultimately underwent the successful cardioversion by Dr. Davina Poke successfully and received 1 shock of 200 J of biphasic synchronized rhythm with restoration of sinus rhythm.  Chemistry profile done prior to the cardioversion revealed his creatinine had increased to 1.69 and as result I recommended he reduce his olmesartan down to 10 mg from his dose of 20 mg.  I saw him on October 10, 2018 in follow-up of his cardioversion.  At that time he was maintaining sinus rhythm and feeling well.  He had more energy.  His stress level had significantly reduced since his wife had successful L5-S1 back surgery by Dr. Vertell Limber.  He was continuing to use CPAP with 100% compliance.    I evaluated him on January 09, 2019.  At that time he stated that over the 3 months previous he had continued to feel well and remained asymptomatic.  At times there are still periods of increased stress.  He continued to use CPAP with 100% compliance.  A download was obtained from November 22 through January 08, 2019 which showed 100% compliance.  Average usage was 6 hours 47 minutes.  AHI is excellent at 1.4 with his AutoSet CPAP minimum pressure set at 8 and maximum of 20, with 95th  percentile pressure 10.2 with maximum average pressure 12.0.  During his evaluation, his ECG verify that he was back in atrial fibrillation.  At the time, he did not inform me that he had noted some irregularity but retrospectively this may have been going on for several weeks prior to that evaluation.  He was continuing to walk daily and exercising on his bike and was asymptomatic without shortness of breath.  During that evaluation I had a long discussion with him regarding possible EP evaluation for consideration of ablation.  After his significant discussion elected to initiate an attempt at antiarrhythmic therapy with low-dose flecainide initially at 50 mg twice a day with plans for office visit in 2 weeks.  He has continued to be on Eliquis for anticoagulation in addition to Zetia for his hyperlipidemia.  When I saw him on January 24, 2019 he stated that he had felt some fatigue once flecainide was instituted.  He was feeling well but this past Sunday he had been working very hard going up and down numerous steps carrying boxes with heavy exertion making at least 40 trips carrying Christmas girls back upstairs.  He was tired.  Sunday evening while sitting down he became disoriented anxious and it appeared that there was possibly some transient stress of aphasia.  He denied any focal weakness.  His symptoms ultimately resolved after 30 minutes.  He had called the office during the workweek and was concerned about his symptoms possibly secondary to flecainide.  He had self reduced his flecainide dose to just once a day rather than twice a day and is worked into my schedule today for follow-up evaluation.  Presently, he has no residual issues since that event several days ago.    At his January 2021 evaluation, I recommended he undergo a neurologic evaluation with Dr. Leonie Man as well as discussed an EP evaluation for possible consideration of future atrial fibrillation ablation.  He saw Dr. Curt Bears on February 08, 2019  and was in persistent atrial fibrillation.  Was discussion concerning continuing flecainide with subsequent high voltage cardioversion.  He saw Dr. Leonie Man February 27, 2019 and after a long evaluation it was felt that his transient episode of word finding difficulty and anomia likely represented mild cognitive impairment rather than TIA or seizure.  He subsequently saw Dr Curt Bears back on March 05, 2019 and on 323 he underwent successful cardioversion with Dr. Oval Linsey which required 3 shocks with restoration of sinus rhythm.  He was evaluated by me in March 2021 at which time he felt his heart rhythm had remained stable.    He was under considerable increased stress to his wife had fallen in sustained a fracture to her humerus as well as tear to her rotator cuff and biceps tendon.  Ul diltiazem was discontinued and he was started on amlodipine 5 mg and was told to continue olmesartan 20 mg in addition to his metoprolol and spironolactone.  Timately she underwent surgery.  He denies chest pain PND orthopnea.  He has continued to use CPAP download from March 05, 2019 through April 03, 2019 continues to show excellent compliance with an AHI of 2.7 and 95th percentile pressure 11.4 cm.   He was evaluated by Roby Lofts June 20, 2019.  He denies chest pain or shortness of breath.  He had noted some elevated blood pressures as well as some dyspnea on exertion.  During that evaluation his diltiazem was discontinued and he was started on amlodipine 5 mg.  He was told to continue his present dose of olmesartan, metoprolol, spironolactone.  During that evaluation he was maintaining sinus rhythm but had bradycardia and first-degree heart block.  Flecainide was reduced down to 50 mg twice a day since his symptoms of fatigability and dyspnea seem to occur at the higher dose.  With return to a lower dose his symptoms resolved.  I evaluated him in June 2021 over the prior 3 months he denied any recurrent  episodes of chest pain.    He continues to have issues in the first 10 minutes of his CPAP use which I suspect may be related to not having a ramp time on his machine which can easily be adjusted.  His blood pressure does continue to be somewhat labile but he states at home most of the time it is controlled.  He was unaware of any recurrent episodes of atrial fibrillation.  He denied presyncope or syncope.    He underwent a nuclear stress test which apparently had been ordered for dyspnea on exertion.  This was low risk study and demonstrated a hypertensive response to exercise.  A defect was felt to be present in the inferior wall which was interpreted as scar with mild peri-infarct ischemia, which may also have been exacerbated by his body habitus and diaphragmatic attenuation.  Currently he remains asymptomatic and denies chest pain or shortness of breath.  He has been evaluated by Dr. Hermina Barters for left cochlear Mnire's disease.  He underwent audiogram which showed mild to moderate severity high-frequency sensorineural neural hearing loss in the right ear and mild to moderately severe downsloping sensorineural neural hearing loss in the left ear.   I saw him in October 2021 at which time he denied any recurrent episodes of atrial fibrillation.  He  continued to be on amlodipine 5 mg, metoprolol XL 100 mg in the evening with 25 or 50 mg in the morning, olmesartan 40 mg, and spironolactone 12.5 mg daily.  He was on  Eliquis for anticoagulation.  He wastolerating Livalo 2 mg and Zetia for hyperlipidemia with LDL cholesterol at 55 in June 2021.  He tries to play golf several times a week if possible and continues to score well.  He continues to be followed by Dr. Dennard Nip with plans for continued weight loss.  I saw him in February 2022 and since his prior evaluation he was having issues with his knees.   He had undergone removal of his meniscus.  He has been followed by Charlann Boxer he recently had a  hyaluronic acid injection with benefit.  He also has undergone evaluation with Dr. Ihor Gully with discomfort in his right hip.  He has low back issues at L5-S1 and is now on gabapentin.  He is unaware of recurrent atrial fibrillation.  Apparently he is was only taking flecainide 50 mg daily instead of twice a day.  He continued to be on amlodipine 7.5 mg, Eliquis 5 mg twice a day, metoprolol succinate 150 mg in the evening and spironolactone 12.5 mg daily.  At times he he has some mild blood pressure elevation.  He continued to be evaluated at healthy weight and wellness.  Because of his knee and hip issues he has not been playing as much golf as he had in the past.  He continuesd to use CPAP with 100% compliance. A download was obtained from January 20 through March 07, 2020.  He is averaging 7 hours and 2 minutes of CPAP use per night.  AHI is 1.5 with a 95th percentile pressure at 11.5 and maximum average pressure at 13.  During that evaluation, his QTc interval was prolonged at 497 and rather than further increase flecainide I recommended titration of metoprolol to 50 mg in the morning and 100 mg at night.  He continued to be on Livalo 2 mg daily and Zetia for hyperlipidemia.  I saw him on July 17, 2020.  Since his prior evaluation he developed an episode of recurrent atrial fibrillation and was seen by Jory Sims, NP on June 27, 2020 and was scheduled to see Dr. Curt Bears on June 30, 2020.  During that evaluation, his flecainide was discontinued and amiodarone 400 mg twice a day was initiated for 2 weeks with plans to reduce to 400 mg for 2 weeks and then 200 mg daily.  He was scheduled to undergo DC cardioversion on July 14 2020 and upon arrival for the procedure he was found to have converted to sinus rhythm and cardioversion was not performed.  When seen by Dr. Curt Bears, the patient had already a scheduled right hip replacement surgery to be done by Dr. [name] on August 12, 2020.  Dr. Curt Bears has scheduled  Mr. Bohrer to undergo cardioversion in late August 2022.  He was using CPAP with 100% compliance with AHI of 1.7 and 95th percentile pressure 10.7 with maximum average pressure 12.7 centimeters of water.  He underwent CT cardiac morphology/pulmonary vein prior to his ablation.  Calcium score was 42.7 Aggrastat units which is the 25th percentile for age, race and sex matched control.  He had an upper normal ascending aorta size at 3.9 cm.  He underwent successful atrial fibrillation ablation with Dr. Curt Bears on September 02, 2020.  He saw him in follow-up on December 16, 2020 and was maintaining sinus rhythm and was taken off flecainide.  I saw him on January 22, 2021 at which time he was maintaining sinus rhythm and continued to be on  Eliquis 5 mg twice a  day.  He has been on amlodipine 7. 5 mg daily, metoprolol succinate 50 mg twice a day, olmesartan 40 mg daily in addition to spironolactone 12.5 mg daily for hypertension.  He is on Livalo 2 mg with Zetia 10 mg for hyperlipidemia.  He continues to see Dr. Leafy Ro at healthy weight and wellness program.  He has resumed his activity and is walking 2 miles in the morning and riding a stationary bike for 30 minutes later in the afternoon.    He underwent an echo Doppler study on July 24, 2021 which showed EF 50 to 55% (54.2% on biplane).  He had moderate left atrial dilatation.  There was mild dilation of his aortic root at 43 mm and aortic arch at 45 mm.  He has been seen by Dr. Curt Bears and more recently by Barrington Ellison, PA-C.  He was noted to have very brief SVT on a long-term monitor.  He has continued to use CPAP and a download from September 7 through October 23, 2021 showed average use at 7 hours and 37 minutes per night.  AHI was excellent at 0.9 with his pressure range 6 to 8 to 20 cm of water.  He had never had a colonoscopy recently had a Cologuard test recommended by his primary physician which was positive.  He subsequently underwent colonoscopy by  Dr. Hinton Dyer and a small mass was detected in the colon with biopsy positive for malignancy.  He is undergoing further evaluation with CT imaging with plans to for surgical consultation.    I last saw him on December 02, 2021. Since his prior evaluation, he underwent CT imaging and no findings of metastatic disease in the chest abdomen or pelvis was detected.  He is tentatively scheduled to undergo surgery on December 4 with Dr. Nadeen Landau for resection of his colonic mass.  Presently, he continues to feel well.  He denies any chest pain or shortness of breath.  He continues to be on amlodipine 5 mg, metoprolol succinate 100 mg twice a day, olmesartan 40 mg, and spironolactone 12.5 mg daily for hypertension.  He is on Eliquis anticoagulation and denies bleeding.  He is on Zetia/Livalo for hyperlipidemia.  He has been taking over-the-counter ferrous sulfate.  He continues to use CPAP with 100% compliance.  CPAP setting is 8 to 20 cm of water and AHI is excellent at 0.7 with 95th percentile pressure 10.5 with maximum average pressure 11.9 cm of water.   Since his last evaluation, he underwent successful right hemicolectomy for ascending colon adenocarcinoma on December 21, 2021 by Dr. Nadeen Landau.  Surgery was uncomplicated and tumor margins were negative.  He has subsequently been seen by Dr. Loletha Carrow of GI March 08, 2022 and was doing well.  He is now trying to get back in shape.  He has had some difficulty with his left knee and has been using a stationary bike rather than walking.  He underwent biopsy and resection of a skin lesion on his neck which was squamous cell CA. presently, he feels well.  He denies any chest pain.  He denies palpitations or any recurrent atrial fibrillation.  He admits to some occasional ankle swelling.  He continues to use CPAP with 100% compliance and has been on CPAP for 17 years.  A download was obtained from February 24 through April 11, 2022 which shows 100% compliance  with average use at 7 hours and 15 minutes.  AHI is excellent at 1.3.  His CPAP is set  at a pressure range of 8 to 20 cm.  95th percentile pressure was 10.8 with maximum average pressure 13.0.  Presently he is on amlodipine 7.5 mg, metoprolol succinate which he takes 50 mg twice a day, spironolactone 25 mg alternating with 12.5 mg on Monday Wednesday and Friday.  He is on Zetia 10 mg and Livalo 2 mg daily.  He is on metformin 500 mg daily.  He presents for evaluation.   Past Medical History:  Diagnosis Date   Aortic atherosclerosis ([place]) 12/21/2021   Arthritis    hips    Atrial fibrillation ([facility])    Cataract    Chronic diastolic heart failure ([place]) 12/21/2021   Cochlear Meniere syndrome of left ear 06/09/2015   CPAP (continuous positive airway pressure) dependence    Dysrhythmia    PAF- hx of 7 years ago    HLD (hyperlipidemia)    Hypertension 05/29/2010   ECHO-EF 67% wnl   Meniere disease    Obesity    OSA on CPAP 08/20/2012   Palpitations    S/P right total hip arthroplasty 08/12/2020   Sleep apnea 09/14/2004   [facility]- Dr. Humphrey Rolls; CPAP titration 11/12/04- Dr. Humphrey Rolls    Past Surgical History:  Procedure Laterality Date   ATRIAL FIBRILLATION ABLATION N/A 09/10/2020   Procedure: ATRIAL FIBRILLATION ABLATION;  Surgeon: Constance Haw, MD;  Location: [facility];  Service: Cardiovascular;  Laterality: N/A;   CARDIAC CATHETERIZATION  11/03/2005   CARDIOVERSION N/A 09/26/2018   Procedure: CARDIOVERSION;  Surgeon: Geralynn Rile, MD;  Location: [facility];  Service: Endoscopy;  Laterality: N/A;   CARDIOVERSION N/A 03/13/2019   Procedure: CARDIOVERSION;  Surgeon: Skeet Latch, MD;  Location: [facility];  Service: Cardiovascular;  Laterality: N/A;   CARDIOVERSION N/A 03/03/2021   Procedure: CARDIOVERSION;  Surgeon: Josue Hector, MD;  Location: [facility];  Service: Cardiovascular;  Laterality: N/A;   CATARACT EXTRACTION  01/19/2012    KNEE ARTHROSCOPY     left knee- 2002    LAPAROSCOPIC PARTIAL RIGHT COLECTOMY Right 12/21/2021   Procedure: LAPAROSCOPIC RIGHT HEMICOLECTOMY;  Surgeon: Ileana Roup, MD;  Location: [facility];  Service: General;  Laterality: Right;   TOTAL HIP ARTHROPLASTY  06/08/2011   Procedure: TOTAL HIP ARTHROPLASTY ANTERIOR APPROACH;  Surgeon: [name], MD;  Location: [facility];  Service: Orthopedics;  Laterality: Left;   TOTAL HIP ARTHROPLASTY Right 08/12/2020   Procedure: TOTAL HIP ARTHROPLASTY ANTERIOR APPROACH;  Surgeon: Paralee Cancel, MD;  Location: [facility];  Service: Orthopedics;  Laterality: Right;    No Known Allergies   Current Outpatient Medications  Medication Sig Dispense Refill   amLODipine (NORVASC) 10 MG tablet Take 1 tablet (10 mg total) by mouth daily. 90 tablet 3   Coenzyme Q10 200 MG capsule Take 200 mg by mouth in the morning.     ferrous sulfate 325 (65 FE) MG EC tablet Take 325 mg by mouth in the morning and at bedtime.     metFORMIN (GLUCOPHAGE) 500 MG tablet TAKE 1 TABLET BY MOUTH EVERY DAY WITH BREAKFAST 90 tablet 0   metoprolol succinate (TOPROL-XL) 50 MG 24 hr tablet Take 75 mg  ( 1 and 1/2 tablet of 50 mg ) in the morning  and 50 mg ( 1 tablet ) in the evening 135 tablet 3   Multiple Vitamins-Minerals (MULTIVITAMIN WITH MINERALS) tablet Take 1 tablet by mouth daily.     Pitavastatin Calcium (LIVALO) 2 MG TABS Take 1 tablet (2 mg total)  by mouth every evening. 90 tablet 1   tamsulosin (FLOMAX) 0.4 MG CAPS capsule Take 0.4 mg by mouth in the morning.     ELIQUIS 5 MG TABS tablet TAKE 1 TABLET BY MOUTH TWICE A DAY 180 tablet 1   ezetimibe (ZETIA) 10 MG tablet TAKE 1 TABLET (10 MG TOTAL) BY MOUTH IN THE MORNING 90 tablet 1   spironolactone (ALDACTONE) 25 MG tablet Take 1 tablet (25 mg total) by mouth daily. 135 tablet 1   No current facility-administered medications for this visit.    Socially he is remarried after his first wife passed away. He has 2 children. He is  retired  and was the Building control surveyor at [employer]. there is no tobacco history.  He plays golf at least 2 days/week and this past year not only did he have a hole in 1 but he has also shot his age.  ROS General: Negative; No fevers, chills, or night sweats;  HEENT: Negative; No changes in vision or hearing, sinus congestion, difficulty swallowing Pulmonary: Negative; No cough, wheezing, shortness of breath, hemoptysis Cardiovascular: See HPI GI: Recent colonoscopy with colonic mass; status post right hemicolectomy for ascending colon adenocarcinoma GU: Negative; No dysuria, hematuria, or difficulty voiding Musculoskeletal: He is status post rib fracture.  He is status post hip replacement.  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 Psychiatric: Negative; No behavioral problems, depression Sleep: Positive for obstructive sleep apnea on CPAP therapy with 100% compliance No snoring, daytime sleepiness, hypersomnolence, bruxism, restless legs, hypnogognic hallucinations, no cataplexy Other comprehensive 14 point system review is negative.  PE BP (!) 158/98 (BP Location: Left Arm, Patient Position: Sitting, Cuff Size: Large)   Pulse 88   Ht 6\' 3"  (1.905 m)   Wt 268 lb (121.6 kg)   BMI 33.50 kg/m    Repeat blood pressure by me was initially elevated at 160/100.  Repeat 154/96.  He states his blood pressure at home typically runs around [id number] systolically.  Wt Readings from Last 3 Encounters:  04/12/22 268 lb (121.6 kg)  03/15/22 268 lb (121.6 kg)  03/08/22 268 lb 4 oz (121.7 kg)  In 2021 he had lost approximately 40 pounds   General: Alert, oriented, no distress.  Skin: normal turgor, no rashes, warm and dry HEENT: Normocephalic, atraumatic. Pupils equal round and reactive to  light; sclera anicteric; extraocular muscles intact; Nose without nasal septal hypertrophy Mouth/Parynx benign; Mallinpatti scale 3 Neck: No JVD, no carotid bruits; normal carotid upstroke Lungs: clear to ausculatation and percussion; no wheezing or rales Chest wall: without tenderness to palpitation Heart: PMI not displaced, RRR, s1 s2 normal, 1/6 systolic murmur, no diastolic murmur, no rubs, gallops, thrills, or heaves Abdomen: Central adiposity; soft, nontender; no hepatosplenomehaly, BS+; abdominal aorta nontender and not dilated by palpation. Back: no CVA tenderness Pulses 2+ Musculoskeletal: full range of motion, normal strength, no joint deformities Extremities: no clubbing cyanosis or edema, Homan's sign negative  Neurologic: grossly nonfocal; Cranial nerves grossly wnl Psychologic: Normal mood and affect  April 12, 2022 ECG (independently read by me): NSR at 89 RBBB, LAHB   December 02, 2021 ECG (independently read by me):  NSR at 72, RBBB, LAD, QTc 479 msec  October 26, 2021 ECG (independently read by me): NSR at 77; RBBB, LAHB, LVH; QTc 484 msec   January 22, 2021 ECG (independently read by  me):  NSR at 76, RBBB: PR 200 msec  July 17, 2020 ECG (independently read by me): NSR at 70; RBBB, 1st degree AV block; PR 220 msec; QTc 492 msec  March 10, 2020 ECG (independently read by me): NSR at 82; RBBB with repoarization, QTc 497 msec  November 06, 2019 ECG (independently read by me): Sinus rhythm at 64; 1st degree AV block; PR 212 msec;RBBB  July 09, 2019 ECG (independently read by me): Normal sinus rhythm at 70 bpm, first-degree AV block with a.  PR interval at 212 ms.  Right bundle branch block with repolarization changes.  Borderline LVH by voltage criteria.  QTc interval 470 ms.  April 04, 2019 ECG (independently read by me): Sinus bradycardia 59 bpm, first-degree AV block with a PR of 248 ms.  LVH.  Inferior Q waves, unchanged  January 24, 2019 ECG (independently read by  me): Atrial fibrillation at 83 bpm.  Borderline LVH by voltage criteria in aVL; QTc interval 446 ms.  January 09, 2019 ECG (independently read by me): Atrial fibrillation ar 97, RBBB; Left axis deviation; Inferior Q waves, unchanged. QTc 487    August 09, 2018 ECG (independently read by me): Normal sinus rhythm at 72 bpm.  Left Axis deviation.  QTc interval 429 ms.  September 15, 2018 ECG (independently read by me): Atrial fibrillation at 92 bpm.  Right bundle branch block with repolarization changes.  August 23, 2018 ECG (independently read by me): Atrial fibrillation at 96 bpm.  LVH with repolarization changes.  Left axis deviation.  Left anterior hemiblock.  Inferior Q waves  August 09, 2018 ECG (independently read by me):Atrial fibrillatoion at 15; LVH with repolarization  October 219 ECG (independently read by me): Normal sinus rhythm at 81 bpm.  Nonspecific intraventricular conduction delay.  Left axis deviation  September 2018 ECG (independently read by me): Normal sinus rhythm at 63 bpm.  Left bundle branch block with repolarization changes.  Normal intervals.  No ectopy.  April 2018 ECG (independently read by me): Normal sinus rhythm at 68 bpm.  LVH with repolarization.  Normal intervals.  November 2017 ECG (independently read by me): Normal sinus rhythm at 78 bpm.  Nonspecific interventricular delay.  QRS complex V1 V2.  August 2016 ECG (independently read by me): Normal sinus rhythm at 77 bpm.  LVH with repolarization changes.  Left axis deviation.  December 2015 ECG (independently read by me): Normal sinus rhythm at 87 bpm.  LVH by voltage criteria with T-wave changes in leads 1 and aVL.  July 2014 ECG: Normal sinus rhythm at 76 beats per minute. LVH with QRS widening.  LABS:     Latest Ref Rng & Units 12/24/2021    1:26 PM 12/24/2021    5:16 AM 12/23/2021    4:57 AM  BMP  Glucose 70 - 99 mg/dL 107  106  112   BUN 8 - 23 mg/dL 37  30  23   Creatinine 0.61 - 1.24 mg/dL 1.57  1.62   1.52   Sodium 135 - 145 mmol/L 135  137  138   Potassium 3.5 - 5.1 mmol/L 4.5  4.8  4.8   Chloride 98 - 111 mmol/L 106  108  108   CO2 22 - 32 mmol/L 21  20  22    Calcium 8.9 - 10.3 mg/dL 9.7  9.6  9.8       Latest Ref Rng & Units 10/26/2021    9:53 AM 08/05/2021    8:19 AM 07/08/2021  11:00 AM  Hepatic Function  Total Protein 6.0 - 8.3 g/dL 7.3  6.3  7.0   Albumin 3.5 - 5.2 g/dL 4.4  4.3  4.6   AST 0 - 37 U/L 21  22  24    ALT 0 - 53 U/L 20  20  21    Alk Phosphatase 39 - 117 U/L 63  55  80   Total Bilirubin 0.2 - 1.2 mg/dL 0.7  0.9  0.8       Latest Ref Rng & Units 12/24/2021    5:16 AM 12/23/2021    4:57 AM 12/22/2021    4:45 AM  CBC  WBC 4.0 - 10.5 K/uL 6.9  8.5  11.1   Hemoglobin 13.0 - 17.0 g/dL 13.1  13.1  13.1   Hematocrit 39.0 - 52.0 % 40.7  40.6  39.6   Platelets 150 - 400 K/uL 184  189  206    Lab Results  Component Value Date   MCV 89.1 12/24/2021   MCV 89.0 12/23/2021   MCV 86.7 12/22/2021   Lab Results  Component Value Date   TSH 1.010 07/08/2021    Lab Results  Component Value Date   HGBA1C 5.2 12/08/2021    Lipid Panel     Component Value Date/Time   CHOL 113 08/05/2021 0819   CHOL 120 08/01/2020 1031   TRIG 50.0 08/05/2021 0819   HDL 48.40 08/05/2021 0819   HDL 54 08/01/2020 1031   CHOLHDL 2 08/05/2021 0819   VLDL 10.0 08/05/2021 0819   LDLCALC 55 08/05/2021 0819   LDLCALC 50 08/01/2020 1031   LDLCALC 119 (H) 10/12/2016 0902     RADIOLOGY: No results found.  IMPRESSION:  1. Paroxysmal atrial fibrillation: Status post ablation September 02, 2020 by Dr. Curt Bears   2. Essential hypertension   3. OSA on CPAP   4. Mixed hyperlipidemia   5. Ankle edema   6. Anticoagulated   7. Mild obesity   8. Pre-diabetes      ASSESSMENT AND PLAN: Mr Funaro is a 77 year old gentleman who has a long-standing history of hypertension and remotely had been on amlodipine/benazepril 10/40, HCTZ 25 mg, Toprol-XL 100 mg in addition to spironolactone 12.5 mg  daily.  He had had an iisolated episode of atrial fibrillation in 2007 and had undergone cardioversion.  On this regimen his blood pressure had stabilized and consistently was well controlled and he had not had any recurrent episodes of atrial fibrillation for many years.  He had gained significant amount of weight since August 2019.  He initiated a major lifestyle change and has been working closely with Dr. Leafy Ro from [facility] resulting in a greater than 40 pound weight loss in 2021.   When I saw him in late July 2020 he was in atrial fibrillation of questionable duration.  At the time retrospectively he believes he may have been in A. fib for 3 to 4 months duration.  I gradually titrated his medical regimen and changed his benazepril to diltiazem and olmesartan to take in addition to metoprolol succinate and his spironolactone 12.5 mg daily.  He had continued to use CPAP with 100% compliance.  His only previous episode of A. fib prior to this episode had occurred in 2007 and he had maintained sinus rhythm until his recurrent episode.  He underwent successful cardioversion on September 26, 2018 with restoration of sinus rhythm.  When I saw him on January 09, 2019 he was continuing to have  periods of increased stress and ECG again showed recurrent atrial fibrillation of questionable duration.  Retrospectively he felt he may have been in atrial fibrillation for 3 to 4 weeks duration prior to that visit.  He also had an episode of disorientation with possible transient expressive aphasia and underwent neurologic evaluation with Dr. Leonie Man and also EP evaluation with Dr. Curt Bears.  He was felt that his word finding difficulty and anomia most likely represented mild cognitive impairment rather than a TIA or stroke.  He had been on increased dose of flecainide up to 100 mg twice a day after his subsequent cardioversion. An echo Doppler study in January 2021 showed an EF of 50 to 55% without  regional wall motion abnormalities.  When I last saw him in February 2022 he was asymptomatic without chest pain or shortness of breath and was maintaining sinus rhythm.  He subsequently reverted back to atrial fibrillation and his flecainide dose was discontinued and he was started on amiodarone initially 400 mg twice a day when seen by Dr. Curt Bears on June 30, 2020.  He had pharmacologically converted to sinus rhythm when he presented for outpatient DC cardioversion on February 14, 2020.  He underwent right successful hip replacement surgery by Dr. [name] and also successful atrial fibrillation ablation by Dr. Curt Bears.  He has been maintaining sinus rhythm and is now off flecainide and amiodarone.  He had never undergone prior colonoscopy but was recently found to have a colonic mass noted on colonoscopy after a Cologuard test was positive.  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  But there was no findings of metastatic disease in the chest, abdomen or pelvis.  He did have probable cholelithiasis.  His most recent echo Doppler study from July 2023 shows EF at 50 to 55% (54.2% by biplane).  There was mild LVH and mild grade 1 diastolic dysfunction.  His left atrium was moderately dilated and there was mild dilatation of his aortic root measuring 43 mm, ascending aorta at 42 mm and aortic arch at 45 mm.  At his November 2023 evaluation, he was given clearance to undergo planned surgery.  He underwent successful right hemicolectomy on December 21, 2021 by Dr. Nadeen Landau.  Clinically he has done remarkably well with reference to this.  He also recently had undergone biopsy and resection of small left squamous cell carcinoma of his neck.  Weight today is 268 pounds with his goal set at less than 220.  He has been trying to get back in shape.  He has been undergoing injections in his left knee and has been using a stationary bike.  His blood pressure today is elevated despite his  current medical regimen.  He has 1+ ankle edema. I have recommended titration of amlodipine from 7.5 mg to 10 mg.  I am increasing spironolactone to 25 mg daily from 25 alternating with 12.5 mg every other day.  I have also recommended slight titration of metoprolol succinate from 50 mg twice a day to 75 mg in the morning and 50 mg at night.  Apparently, since his last visit he has been off olmesartan.  I am recommending follow-up laboratory with a comprehensive metabolic panel, CBC TSH and lipid studies.  He will monitor his blood pressure.  I will contact him regarding results of laboratory.  Weight loss was encouraged.  I will see him in 2 to 3 months for follow-up evaluation or sooner as needed.    [name], MD, [facility]  04/19/2022 9:14 AM

## 2022-04-14 ENCOUNTER — Ambulatory Visit (INDEPENDENT_AMBULATORY_CARE_PROVIDER_SITE_OTHER): Payer: Medicare PPO

## 2022-04-14 ENCOUNTER — Other Ambulatory Visit: Payer: Self-pay | Admitting: Student

## 2022-04-14 ENCOUNTER — Other Ambulatory Visit: Payer: Self-pay | Admitting: Cardiology

## 2022-04-14 DIAGNOSIS — Z0000 Encounter for general adult medical examination without abnormal findings: Secondary | ICD-10-CM | POA: Diagnosis not present

## 2022-04-14 DIAGNOSIS — I4819 Other persistent atrial fibrillation: Secondary | ICD-10-CM

## 2022-04-14 NOTE — Telephone Encounter (Signed)
Prescription refill request for Eliquis received. Indication: Afib  Last office visit: 04/12/22 Claiborne Billings)  Scr: 1.57 (12/24/21)  Age: 77 Weight: 121.6kg  Appropriate dose. Refill sent.

## 2022-04-14 NOTE — Patient Instructions (Signed)
Clayton [name] , Thank you for taking time to come for your Medicare Wellness Visit. I appreciate your ongoing commitment to your health goals. Please review the following plan we discussed and let me know if I can assist you in the future.   These are the goals we discussed:  Goals       Activity and Exercise Increased      Evidence-based guidance:  Review current exercise levels.  Assess patient perspective on exercise or activity level, barriers to increasing activity, motivation and readiness for change.  Recommend or set healthy exercise goal based on individual tolerance.  Encourage small steps toward making change in amount of exercise or activity.  Urge reduction of sedentary activities or screen time.  Promote group activities within the community or with family or support person.  Consider referral to rehabiliation therapist for assessment and exercise/activity plan.   Notes:       Blood Pressure < 140/90      LIFESTYLE - ATTEND STRESS MANAGEMENT CLASSES      Maintain Mobility and Function      Evidence-based guidance:  Emphasize the importance of physical activity and aerobic exercise as included in treatment plan; assess barriers to adherence; consider patient's abilities and preferences.  Encourage gradual increase in activity or exercise instead of stopping if pain occurs.  Reinforce individual therapy exercise prescription, such as strengthening, stabilization and stretching programs.  Promote optimal body mechanics to stabilize the spine with lifting and functional activity.  Encourage activity and mobility modifications to facilitate optimal function, such as using a log roll for bed mobility or dressing from a seated position.  Reinforce individual adaptive equipment recommendations to limit excessive spinal movements, such as a Systems analyst.  Assess adequacy of sleep; encourage use of sleep hygiene techniques, such as bedtime routine; use of white noise; dark, cool  bedroom; avoiding daytime naps, heavy meals or exercise before bedtime.  Promote positions and modification to optimize sleep and sexual activity; consider pillows or positioning devices to assist in maintaining neutral spine.  Explore options for applying ergonomic principles at work and home, such as frequent position changes, using ergonomically designed equipment and working at optimal height.  Promote modifications to increase comfort with driving such as lumbar support, optimizing seat and steering wheel position, using cruise control and taking frequent rest stops to stretch and walk.   Notes:       Patient Stated      Lose weight       Weight (lb) < 220 lb (99.8 kg) (pt-stated)        This is a list of the screening recommended for you and due dates:  Health Maintenance  Topic Date Due   DTaP/Tdap/Td vaccine (1 - Tdap) Never done   Flu Shot  04/18/2022*   COVID-19 Vaccine (2 - Moderna risk series) 08/13/2027*   Medicare Annual Wellness Visit  04/14/2023   Cologuard (Stool DNA test)  08/17/2024   Hepatitis C Screening: USPSTF Recommendation to screen - Ages 18-79 yo.  Completed   HPV Vaccine  Aged Out   Pneumonia Vaccine  Discontinued   Colon Cancer Screening  Discontinued   Zoster (Shingles) Vaccine  Discontinued  *Topic was postponed. The date shown is not the original due date.    Advanced directives: Please bring a copy of your health care power of attorney and living will to the office at your convenience.  Conditions/risks identified: lose weight   Next appointment: Follow up in one year for  your annual wellness visit.   Preventive Care 67 Years and Older, Male  Preventive care refers to lifestyle choices and visits with your health care provider that can promote health and wellness. What does preventive care include? A yearly physical exam. This is also called an annual well check. Dental exams once or twice a year. Routine eye exams. Ask your health care  provider how often you should have your eyes checked. Personal lifestyle choices, including: Daily care of your teeth and gums. Regular physical activity. Eating a healthy diet. Avoiding tobacco and drug use. Limiting alcohol use. Practicing safe sex. Taking low doses of aspirin every day. Taking vitamin and mineral supplements as recommended by your health care provider. What happens during an annual well check? The services and screenings done by your health care provider during your annual well check will depend on your age, overall health, lifestyle risk factors, and family history of disease. Counseling  Your health care provider may ask you questions about your: Alcohol use. Tobacco use. Drug use. Emotional well-being. Home and relationship well-being. Sexual activity. Eating habits. History of falls. Memory and ability to understand (cognition). Work and work Statistician. Screening  You may have the following tests or measurements: Height, weight, and BMI. Blood pressure. Lipid and cholesterol levels. These may be checked every 5 years, or more frequently if you are over 80 years old. Skin check. Lung cancer screening. You may have this screening every year starting at age 6 if you have a 30-pack-year history of smoking and currently smoke or have quit within the past 15 years. Fecal occult blood test (FOBT) of the stool. You may have this test every year starting at age 95. Flexible sigmoidoscopy or colonoscopy. You may have a sigmoidoscopy every 5 years or a colonoscopy every 10 years starting at age 70. Prostate cancer screening. Recommendations will vary depending on your family history and other risks. Hepatitis C blood test. Hepatitis B blood test. Sexually transmitted disease (STD) testing. Diabetes screening. This is done by checking your blood sugar (glucose) after you have not eaten for a while (fasting). You may have this done every 1-3 years. Abdominal aortic  aneurysm (AAA) screening. You may need this if you are a current or former smoker. Osteoporosis. You may be screened starting at age 50 if you are at high risk. Talk with your health care provider about your test results, treatment options, and if necessary, the need for more tests. Vaccines  Your health care provider may recommend certain vaccines, such as: Influenza vaccine. This is recommended every year. Tetanus, diphtheria, and acellular pertussis (Tdap, Td) vaccine. You may need a Td booster every 10 years. Zoster vaccine. You may need this after age 74. Pneumococcal 13-valent conjugate (PCV13) vaccine. One dose is recommended after age 33. Pneumococcal polysaccharide (PPSV23) vaccine. One dose is recommended after age 68. Talk to your health care provider about which screenings and vaccines you need and how often you need them. This information is not intended to replace advice given to you by your health care provider. Make sure you discuss any questions you have with your health care provider. Document Released: 01/31/2015 Document Revised: 09/24/2015 Document Reviewed: 11/05/2014 Elsevier Interactive Patient Education  2017 [employer] Prevention in the Home Falls can cause injuries. They can happen to people of all ages. There are many things you can do to make your home safe and to help prevent falls. What can I do on the outside of my home? Regularly  fix the edges of walkways and driveways and fix any cracks. Remove anything that might make you trip as you walk through a door, such as a raised step or threshold. Trim any bushes or trees on the path to your home. Use bright outdoor lighting. Clear any walking paths of anything that might make someone trip, such as rocks or tools. Regularly check to see if handrails are loose or broken. Make sure that both sides of any steps have handrails. Any raised decks and porches should have guardrails on the edges. Have any leaves,  snow, or ice cleared regularly. Use sand or salt on walking paths during winter. Clean up any spills in your garage right away. This includes oil or grease spills. What can I do in the bathroom? Use night lights. Install grab bars by the toilet and in the tub and shower. Do not use towel bars as grab bars. Use non-skid mats or decals in the tub or shower. If you need to sit down in the shower, use a plastic, non-slip stool. Keep the floor dry. Clean up any water that spills on the floor as soon as it happens. Remove soap buildup in the tub or shower regularly. Attach bath mats securely with double-sided non-slip rug tape. Do not have throw rugs and other things on the floor that can make you trip. What can I do in the bedroom? Use night lights. Make sure that you have a light by your bed that is easy to reach. Do not use any sheets or blankets that are too big for your bed. They should not hang down onto the floor. Have a firm chair that has side arms. You can use this for support while you get dressed. Do not have throw rugs and other things on the floor that can make you trip. What can I do in the kitchen? Clean up any spills right away. Avoid walking on wet floors. Keep items that you use a lot in easy-to-reach places. If you need to reach something above you, use a strong step stool that has a grab bar. Keep electrical cords out of the way. Do not use floor polish or wax that makes floors slippery. If you must use wax, use non-skid floor wax. Do not have throw rugs and other things on the floor that can make you trip. What can I do with my stairs? Do not leave any items on the stairs. Make sure that there are handrails on both sides of the stairs and use them. Fix handrails that are broken or loose. Make sure that handrails are as long as the stairways. Check any carpeting to make sure that it is firmly attached to the stairs. Fix any carpet that is loose or worn. Avoid having throw  rugs at the top or bottom of the stairs. If you do have throw rugs, attach them to the floor with carpet tape. Make sure that you have a light switch at the top of the stairs and the bottom of the stairs. If you do not have them, ask someone to add them for you. What else can I do to help prevent falls? Wear shoes that: Do not have high heels. Have rubber bottoms. Are comfortable and fit you well. Are closed at the toe. Do not wear sandals. If you use a stepladder: Make sure that it is fully opened. Do not climb a closed stepladder. Make sure that both sides of the stepladder are locked into place. Ask someone to hold  it for you, if possible. Clearly mark and make sure that you can see: Any grab bars or handrails. First and last steps. Where the edge of each step is. Use tools that help you move around (mobility aids) if they are needed. These include: Canes. Walkers. Scooters. Crutches. Turn on the lights when you go into a dark area. Replace any light bulbs as soon as they burn out. Set up your furniture so you have a clear path. Avoid moving your furniture around. If any of your floors are uneven, fix them. If there are any pets around you, be aware of where they are. Review your medicines with your doctor. Some medicines can make you feel dizzy. This can increase your chance of falling. Ask your doctor what other things that you can do to help prevent falls. This information is not intended to replace advice given to you by your health care provider. Make sure you discuss any questions you have with your health care provider. Document Released: 10/31/2008 Document Revised: 06/12/2015 Document Reviewed: 02/08/2014 Elsevier Interactive Patient Education  2017 [employer].

## 2022-04-14 NOTE — Progress Notes (Signed)
I connected with  [name] on 04/14/22 by a audio enabled telemedicine application and verified that I am speaking with the correct person using two identifiers.  Patient Location: Home  Provider Location: Home Office  I discussed the limitations of evaluation and management by telemedicine. The patient expressed understanding and agreed to proceed.   Subjective:   Clayton [name] is a 77 y.o. male who presents for Medicare Annual/Subsequent preventive examination.  Review of Systems     Cardiac Risk Factors include: advanced age (>63men, >46 women);dyslipidemia;hypertension;male gender;obesity (BMI >30kg/m2)     Objective:    There were no vitals filed for this visit. There is no height or weight on file to calculate BMI.     04/14/2022    7:58 AM 12/21/2021   11:03 AM 12/08/2021   10:20 AM 04/10/2021    4:24 PM 03/03/2021    7:43 AM 03/03/2021    7:34 AM 03/03/2021    7:33 AM  Advanced Directives  Does Patient Have a Medical Advance Directive? Yes Yes Yes Yes  Yes Yes  Type of Paramedic of [employer];Living will [place];Living will [place];Living will Living will;Healthcare Power of [place];Living will Healthcare Power of [place]  Does patient want to make changes to medical advance directive?  No - Patient declined  No - Patient declined     Copy of [place] in Chart? No - copy requested No - copy requested  No - copy requested Yes - validated most recent copy scanned in chart (See row information)  No - copy requested    Current Medications (verified) Outpatient Encounter Medications as of 04/14/2022  Medication Sig   amLODipine (NORVASC) 10 MG tablet Take 1 tablet (10 mg total) by mouth daily.   Coenzyme Q10 200 MG capsule Take 200 mg by mouth in the morning.   ELIQUIS 5 MG TABS tablet TAKE 1 TABLET BY MOUTH TWICE A DAY    ezetimibe (ZETIA) 10 MG tablet Take 1 tablet (10 mg total) by mouth in the morning.   ferrous sulfate 325 (65 FE) MG EC tablet Take 325 mg by mouth in the morning and at bedtime.   metFORMIN (GLUCOPHAGE) 500 MG tablet TAKE 1 TABLET BY MOUTH EVERY DAY WITH BREAKFAST   metoprolol succinate (TOPROL-XL) 50 MG 24 hr tablet Take 75 mg  ( 1 and 1/2 tablet of 50 mg ) in the morning  and 50 mg ( 1 tablet ) in the evening   Multiple Vitamins-Minerals (MULTIVITAMIN WITH MINERALS) tablet Take 1 tablet by mouth daily.   Pitavastatin Calcium (LIVALO) 2 MG TABS Take 1 tablet (2 mg total) by mouth every evening.   spironolactone (ALDACTONE) 25 MG tablet Take 1 tablet (25 mg total) by mouth daily.   tamsulosin (FLOMAX) 0.4 MG CAPS capsule Take 0.4 mg by mouth in the morning.   No facility-administered encounter medications on file as of 04/14/2022.    Allergies (verified) Patient has no known allergies.   History: Past Medical History:  Diagnosis Date   Aortic atherosclerosis ([place]) 12/21/2021   Arthritis    hips    Atrial fibrillation ([facility])    Cataract    Chronic diastolic heart failure ([place]) 12/21/2021   Cochlear Meniere syndrome of left ear 06/09/2015   CPAP (continuous positive airway pressure) dependence    Dysrhythmia    PAF- hx of 7 years ago    HLD (  hyperlipidemia)    Hypertension 05/29/2010   ECHO-EF 67% wnl   Meniere disease    Obesity    OSA on CPAP 08/20/2012   Palpitations    S/P right total hip arthroplasty 08/12/2020   Sleep apnea 09/14/2004   [facility]- Dr. Humphrey Rolls; CPAP titration 11/12/04- Dr. Humphrey Rolls   Past Surgical History:  Procedure Laterality Date   ATRIAL FIBRILLATION ABLATION N/A 09/10/2020   Procedure: ATRIAL FIBRILLATION ABLATION;  Surgeon: Constance Haw, MD;  Location: [facility];  Service: Cardiovascular;  Laterality: N/A;   CARDIAC CATHETERIZATION  11/03/2005   CARDIOVERSION N/A 09/26/2018   Procedure: CARDIOVERSION;  Surgeon: Geralynn Rile, MD;  Location: [facility];  Service: Endoscopy;  Laterality: N/A;   CARDIOVERSION N/A 03/13/2019   Procedure: CARDIOVERSION;  Surgeon: Skeet Latch, MD;  Location: [facility];  Service: Cardiovascular;  Laterality: N/A;   CARDIOVERSION N/A 03/03/2021   Procedure: CARDIOVERSION;  Surgeon: Josue Hector, MD;  Location: [facility];  Service: Cardiovascular;  Laterality: N/A;   CATARACT EXTRACTION  01/19/2012   KNEE ARTHROSCOPY     left knee- 2002    LAPAROSCOPIC PARTIAL RIGHT COLECTOMY Right 12/21/2021   Procedure: LAPAROSCOPIC RIGHT HEMICOLECTOMY;  Surgeon: Ileana Roup, MD;  Location: [facility];  Service: General;  Laterality: Right;   TOTAL HIP ARTHROPLASTY  06/08/2011   Procedure: TOTAL HIP ARTHROPLASTY ANTERIOR APPROACH;  Surgeon: [name], MD;  Location: [facility];  Service: Orthopedics;  Laterality: Left;   TOTAL HIP ARTHROPLASTY Right 08/12/2020   Procedure: TOTAL HIP ARTHROPLASTY ANTERIOR APPROACH;  Surgeon: Paralee Cancel, MD;  Location: [facility];  Service: Orthopedics;  Laterality: Right;   Family History  Problem Relation Age of Onset   Hypertension Mother    Stroke Mother    Diabetes Mother    Hyperlipidemia Mother    Obesity Mother    Arthritis Father    Heart disease Father    Diabetes Sister    Stroke Maternal Grandmother        57 died    Heart disease Maternal Grandfather    Hypertension Maternal Grandfather    Diabetes Maternal Grandfather    Heart disease Paternal Grandfather    Social History   Socioeconomic History   Marital status: Married    Spouse name: Kenley Morgano   Number of children: 2   Years of education: Not on file   Highest education level: Not on file  Occupational History   Occupation: Retired  Tobacco Use   Smoking status: Never   Smokeless tobacco: Never  Vaping Use   Vaping Use: Never used  Substance and Sexual Activity   Alcohol use: No   Drug use: No   Sexual activity: Yes    Birth  control/protection: None  Other Topics Concern   Not on file  Social History Narrative   Married. [name].Takes care of his mother-in-law as well.    Retired, Conservator, museum/gallery.    Drinks caffeine beverages. Takes a daily vitamin.   Exercises routinely.    Smoke detector in the home.    Ambulates independently.    2-sons   Social Determinants of Health   Financial Resource Strain: Low Risk  (04/14/2022)   Overall Financial Resource Strain (CARDIA)    Difficulty of Paying Living Expenses: Not hard at all  Food Insecurity: No Food Insecurity (04/14/2022)   Hunger Vital Sign    Worried About Running Out of Food in the Last Year: Never true  Ran Out of Food in the Last Year: Never true  Transportation Needs: No Transportation Needs (04/14/2022)   PRAPARE - Hydrologist (Medical): No    Lack of Transportation (Non-Medical): No  Physical Activity: Sufficiently Active (04/14/2022)   Exercise Vital Sign    Days of Exercise per Week: 7 days    Minutes of Exercise per Session: 50 min  Stress: No Stress Concern Present (04/14/2022)   [place]    Feeling of Stress : Not at all  Social Connections: Moderately Isolated (04/14/2022)   Social Connection and Isolation Panel [NHANES]    Frequency of Communication with Friends and Family: More than three times a week    Frequency of Social Gatherings with Friends and Family: More than three times a week    Attends Religious Services: Never    Marine scientist or Organizations: No    Attends Music therapist: Never    Marital Status: Married    Tobacco Counseling Counseling given: Not Answered   Clinical Intake:  Pre-visit preparation completed: Yes  Pain : No/denies pain     BMI - recorded: 33.5 Nutritional Status: BMI > 30  Obese Nutritional Risks: None Diabetes: No  How often do you need to have someone help you  when you read instructions, pamphlets, or other written materials from your doctor or pharmacy?: 1 - Never  Diabetic?no  Interpreter Needed?: No  Information entered by :: Charlott Rakes, LPN   Activities of Daily Living    04/14/2022    8:00 AM 12/21/2021    6:00 PM  In your present state of health, do you have any difficulty performing the following activities:  Hearing? 1 0  Comment left ear hearing aid   Vision? 0 0  Difficulty concentrating or making decisions? 0 0  Walking or climbing stairs? 0 0  Dressing or bathing? 0 0  Doing errands, shopping? 0 0  Preparing Food and eating ? N   Using the Toilet? N   In the past six months, have you accidently leaked urine? N   Do you have problems with loss of bowel control? N   Managing your Medications? N   Managing your Finances? N   Housekeeping or managing your Housekeeping? N     Patient Care Team: Ma Hillock, DO as PCP - General (Family Medicine) [name], MD as PCP - Cardiology (Cardiology) Constance Haw, MD as PCP - Electrophysiology (Cardiology) [name], MD as PCP - Sleep Medicine (Cardiology) [name], MD as Consulting Physician (Cardiology) Warden Fillers, MD as Consulting Physician (Ophthalmology) Sharyne Peach, MD as Consulting Physician (Ophthalmology) Sydnee Levans, MD as Consulting Physician (Dermatology) Vicie Mutters, MD as Consulting Physician (Otolaryngology) Paralee Cancel, MD as Consulting Physician (Orthopedic Surgery) Ardis Hughs, MD as Attending Physician (Urology)  Indicate any recent Medical Services you may have received from other than [facility] providers in the past year (date may be approximate).     Assessment:   This is a routine wellness examination for [facility].  Hearing/Vision screen Hearing Screening - Comments:: Pt wears hearing aids for left ear  Vision Screening - Comments:: Pt follows up with Dr Katy Fitch for annul eye exams   Dietary issues  and exercise activities discussed: Current Exercise Habits: Home exercise routine, Type of exercise: Other - see comments (stationary bike), Time (Minutes): 50, Frequency (Times/Week): 7, Weekly Exercise (Minutes/Week): 350   Goals  Addressed             This Visit's Progress    Patient Stated       Lose weight        Depression Screen    04/14/2022    7:56 AM 01/01/2022   10:25 AM 07/27/2021    9:04 AM 07/04/2020    1:32 PM 11/01/2017    8:40 AM 06/09/2015   10:02 AM  PHQ 2/9 Scores  PHQ - 2 Score 0 0 0 0 1 0  PHQ- 9 Score     5     Fall Risk    04/14/2022    7:59 AM 01/01/2022   10:26 AM 04/10/2021    4:26 PM 07/04/2020    1:32 PM 02/27/2019   10:33 AM  Fall Risk   Falls in the past year? 0 0 0 0 0  Number falls in past yr: 0 0 0 0   Injury with Fall? 0 0 0 0   Risk for fall due to : Impaired vision;Impaired balance/gait No Fall Risks No Fall Risks    Risk for fall due to: Comment readers      Follow up Falls prevention discussed Falls evaluation completed Falls evaluation completed Falls evaluation completed     [place]:  Any stairs in or around the home? Yes  If so, are there any without handrails? Yes  Home free of loose throw rugs in walkways, pet beds, electrical cords, etc? Yes  Adequate lighting in your home to reduce risk of falls? Yes   ASSISTIVE DEVICES UTILIZED TO PREVENT FALLS:  Life alert? No  Use of a cane, walker or w/c? No  Grab bars in the bathroom? Yes  Shower chair or bench in shower? No  Elevated toilet seat or a handicapped toilet? No   TIMED UP AND GO:  Was the test performed? No .   Cognitive Function:    04/10/2021    4:31 PM  MMSE - Mini Mental State Exam  Not completed: Unable to complete        04/14/2022    8:02 AM 04/10/2021    4:30 PM  6CIT Screen  What Year? 0 points 0 points  What month? 0 points 0 points  What time? 0 points 0 points  Count back from 20 0 points 0 points  Months  in reverse 0 points 0 points  Repeat phrase 0 points 0 points  Total Score 0 points 0 points    Immunizations Immunization History  Administered Date(s) Administered   Moderna SARS-COV2 Booster Vaccination 11/09/2019   Moderna Sars-Covid-2 Vaccination 03/31/2019    TDAP status: Due, Education has been provided regarding the importance of this vaccine. Advised may receive this vaccine at local pharmacy or Health Dept. Aware to provide a copy of the vaccination record if obtained from local pharmacy or Health Dept. Verbalized acceptance and understanding.  Flu Vaccine status: Declined, Education has been provided regarding the importance of this vaccine but patient still declined. Advised may receive this vaccine at local pharmacy or Health Dept. Aware to provide a copy of the vaccination record if obtained from local pharmacy or Health Dept. Verbalized acceptance and understanding.  Pneumococcal vaccine status: Declined,  Education has been provided regarding the importance of this vaccine but patient still declined. Advised may receive this vaccine at local pharmacy or Health Dept. Aware to provide a copy of the vaccination record if obtained from local pharmacy or Health  Dept. Verbalized acceptance and understanding.   Covid-19 vaccine status: Completed vaccines  Qualifies for Shingles Vaccine? Yes   Zostavax completed No   Shingrix Completed?: No.    Education has been provided regarding the importance of this vaccine. Patient has been advised to call insurance company to determine out of pocket expense if they have not yet received this vaccine. Advised may also receive vaccine at local pharmacy or Health Dept. Verbalized acceptance and understanding.  Screening Tests Health Maintenance  Topic Date Due   DTaP/Tdap/Td (1 - Tdap) Never done   INFLUENZA VACCINE  04/18/2022 (Originally 08/18/2021)   COVID-19 Vaccine (2 - Moderna risk series) 08/13/2027 (Originally 12/07/2019)   Medicare  Annual Wellness (AWV)  04/14/2023   Fecal DNA (Cologuard)  08/17/2024   Hepatitis C Screening  Completed   HPV VACCINES  Aged Out   Pneumonia Vaccine 2+ Years old  Discontinued   COLONOSCOPY (Pts 45-71yrs Insurance coverage will need to be confirmed)  Discontinued   Zoster Vaccines- Shingrix  Discontinued    Health Maintenance  Health Maintenance Due  Topic Date Due   DTaP/Tdap/Td (1 - Tdap) Never done    Colorectal cancer screening: Type of screening: Cologuard. Completed 08/17/21. Repeat every 3 years   Additional Screening:  Hepatitis C Screening:  Completed 08/05/21  Vision Screening: Recommended annual ophthalmology exams for early detection of glaucoma and other disorders of the eye. Is the patient up to date with their annual eye exam?  Yes  Who is the provider or what is the name of the office in which the patient attends annual eye exams? Dr Katy Fitch If pt is not established with a provider, would they like to be referred to a provider to establish care? No .   Dental Screening: Recommended annual dental exams for proper oral hygiene  Community Resource Referral / Chronic Care Management: CRR required this visit?  No   CCM required this visit?  No      Plan:     I have personally reviewed and noted the following in the patient's chart:   Medical and social history Use of alcohol, tobacco or illicit drugs  Current medications and supplements including opioid prescriptions. Patient is not currently taking opioid prescriptions. Functional ability and status Nutritional status Physical activity Advanced directives List of other physicians Hospitalizations, surgeries, and ER visits in previous 12 months Vitals Screenings to include cognitive, depression, and falls Referrals and appointments  In addition, I have reviewed and discussed with patient certain preventive protocols, quality metrics, and best practice recommendations. A written personalized care plan  for preventive services as well as general preventive health recommendations were provided to patient.     Willette Brace, LPN   [id number]   Nurse Notes: none

## 2022-04-19 ENCOUNTER — Other Ambulatory Visit: Payer: Self-pay

## 2022-04-19 ENCOUNTER — Encounter: Payer: Self-pay | Admitting: Cardiovascular Disease

## 2022-04-19 DIAGNOSIS — R7303 Prediabetes: Secondary | ICD-10-CM

## 2022-04-27 NOTE — Progress Notes (Unsigned)
[name] [address] [place] [address] Phone: [phone] Subjective:   Clayton [name], am serving as a scribe for Dr. Antoine Primas.  I'm seeing this patient by the request  of:  Kuneff, Renee A, DO  CC: Back and neck pain follow-up  DDU:[id number]  JAKAYLEN FABER is a 77 y.o. male coming in with complaint of back and neck pain. OMT on 03/15/2022. Patient states that his knee pain is improving. Using TENS less on his knees. Wants to start walking more but is using stationary bike as well.   Medications patient has been prescribed:   Taking:         Reviewed prior external information including notes and imaging from previsou exam, outside providers and external EMR if available.   As well as notes that were available from care everywhere and other healthcare systems.  Past medical history, social, surgical and family history all reviewed in electronic medical record.  No pertanent information unless stated regarding to the chief complaint.   Past Medical History:  Diagnosis Date   Aortic atherosclerosis 12/21/2021   Arthritis    hips    Atrial fibrillation    Cataract    Chronic diastolic heart failure 12/21/2021   Cochlear Meniere syndrome of left ear 06/09/2015   CPAP (continuous positive airway pressure) dependence    Dysrhythmia    PAF- hx of 7 years ago    HLD (hyperlipidemia)    Hypertension 05/29/2010   ECHO-EF 67% wnl   Meniere disease    Obesity    OSA on CPAP 08/20/2012   Palpitations    S/P right total hip arthroplasty 08/12/2020   Sleep apnea 09/14/2004   [facility]- Dr. Welton Flakes; CPAP titration 11/12/04- Dr. Welton Flakes    No Known Allergies   Review of Systems:  No headache, visual changes, nausea, vomiting, diarrhea, constipation, dizziness, abdominal pain, skin rash, fevers, chills, night sweats, weight loss, swollen lymph nodes, body aches, joint swelling, chest pain, shortness of breath, mood  changes. POSITIVE muscle aches  Objective  Blood pressure (!) 148/96, pulse 63, height 6\' 3"  (1.905 m), weight 268 lb (121.6 kg), SpO2 96 %.   General: No apparent distress alert and oriented x3 mood and affect normal, dressed appropriately.  HEENT: Pupils equal, extraocular movements intact  Respiratory: Patient's speak in full sentences and does not appear short of breath  Cardiovascular: No lower extremity edema, non tender, no erythema  Significant stiffness of the upper back noted.  This does have stiffness in the thoracolumbar junction more than usual. Osteopathic findings  C5 flexed rotated and side bent left T3 extended rotated and side bent right inhaled rib T11 extended rotated and side bent right L1 flexed rotated and side bent right L5 flexed rotated and side bent left Sacrum right on right     Assessment and Plan:  Degenerative disc disease, lumbar Degenerative disc disease noted.  Discussed icing regimen and home exercises.  We discussed core strengthening and weight loss in great detail.  Patient is doing relatively well and doing more topical anti-inflammatories.  Patient has had some difficulty with different labs over the course of time still trying to avoid oral anti-inflammatories with him on the Eliquis as well.  Patient increasing his activity we will see patient in 6 to 8 weeks as well for further evaluation and treatment.    Nonallopathic problems  Decision today to treat with OMT was based on Physical Exam  After  verbal consent patient was treated with HVLA, ME, FPR techniques in cervical, rib, thoracic, lumbar, and sacral  areas  Patient tolerated the procedure well with improvement in symptoms  Patient given exercises, stretches and lifestyle modifications  See medications in patient instructions if given  Patient will follow up in 6-8 weeks     The above documentation has been reviewed and is accurate and complete Judi Saa, DO          Note: This dictation was prepared with Dragon dictation along with smaller phrase technology. Any transcriptional errors that result from this process are unintentional.

## 2022-04-28 ENCOUNTER — Encounter: Payer: Self-pay | Admitting: Family Medicine

## 2022-04-28 ENCOUNTER — Ambulatory Visit: Payer: Medicare PPO | Admitting: Family Medicine

## 2022-04-28 VITALS — BP 148/96 | HR 63 | Ht 75.0 in | Wt 268.0 lb

## 2022-04-28 DIAGNOSIS — M9904 Segmental and somatic dysfunction of sacral region: Secondary | ICD-10-CM

## 2022-04-28 DIAGNOSIS — M9902 Segmental and somatic dysfunction of thoracic region: Secondary | ICD-10-CM

## 2022-04-28 DIAGNOSIS — M9908 Segmental and somatic dysfunction of rib cage: Secondary | ICD-10-CM | POA: Diagnosis not present

## 2022-04-28 DIAGNOSIS — M9901 Segmental and somatic dysfunction of cervical region: Secondary | ICD-10-CM

## 2022-04-28 DIAGNOSIS — M9903 Segmental and somatic dysfunction of lumbar region: Secondary | ICD-10-CM

## 2022-04-28 DIAGNOSIS — M5136 Other intervertebral disc degeneration, lumbar region: Secondary | ICD-10-CM | POA: Diagnosis not present

## 2022-04-28 NOTE — Assessment & Plan Note (Signed)
Degenerative disc disease noted.  Discussed icing regimen and home exercises.  We discussed core strengthening and weight loss in great detail.  Patient is doing relatively well and doing more topical anti-inflammatories.  Patient has had some difficulty with different labs over the course of time still trying to avoid oral anti-inflammatories with him on the Eliquis as well.  Patient increasing his activity we will see patient in 6 to 8 weeks as well for further evaluation and treatment.

## 2022-05-04 ENCOUNTER — Ambulatory Visit: Payer: Medicare PPO | Admitting: Cardiology

## 2022-05-06 DIAGNOSIS — I10 Essential (primary) hypertension: Secondary | ICD-10-CM | POA: Diagnosis not present

## 2022-05-06 DIAGNOSIS — I480 Paroxysmal atrial fibrillation: Secondary | ICD-10-CM | POA: Diagnosis not present

## 2022-05-06 DIAGNOSIS — E782 Mixed hyperlipidemia: Secondary | ICD-10-CM | POA: Diagnosis not present

## 2022-05-06 DIAGNOSIS — G4733 Obstructive sleep apnea (adult) (pediatric): Secondary | ICD-10-CM | POA: Diagnosis not present

## 2022-05-07 LAB — LIPID PANEL
Chol/HDL Ratio: 2.2 ratio (ref 0.0–5.0)
Cholesterol, Total: 114 mg/dL (ref 100–199)
HDL: 52 mg/dL (ref >39)
LDL Chol Calc (NIH): 50 mg/dL (ref 0–99)
Triglycerides: 52 mg/dL (ref 0–149)
VLDL Cholesterol Cal: 12 mg/dL (ref 5–40)

## 2022-05-07 LAB — CBC
Hematocrit: 46.8 % (ref 37.5–51.0)
Hemoglobin: 15.9 g/dL (ref 13.0–17.7)
MCH: 30.8 pg (ref 26.6–33.0)
MCHC: 34 g/dL (ref 31.5–35.7)
MCV: 91 fL (ref 79–97)
Platelets: 189 10*3/uL (ref 150–450)
RBC: 5.17 x10E6/uL (ref 4.14–5.80)
RDW: 13.4 % (ref 11.6–15.4)
WBC: 5.2 10*3/uL (ref 3.4–10.8)

## 2022-05-07 LAB — COMPREHENSIVE METABOLIC PANEL
ALT: 29 IU/L (ref 0–44)
AST: 31 IU/L (ref 0–40)
Albumin/Globulin Ratio: 2.3 — ABNORMAL HIGH (ref 1.2–2.2)
Albumin: 4.5 g/dL (ref 3.8–4.8)
Alkaline Phosphatase: 74 IU/L (ref 44–121)
BUN/Creatinine Ratio: 19 (ref 10–24)
BUN: 25 mg/dL (ref 8–27)
Bilirubin Total: 0.9 mg/dL (ref 0.0–1.2)
CO2: 21 mmol/L (ref 20–29)
Calcium: 10.6 mg/dL — ABNORMAL HIGH (ref 8.6–10.2)
Chloride: 105 mmol/L (ref 96–106)
Creatinine, Ser: 1.34 mg/dL — ABNORMAL HIGH (ref 0.76–1.27)
Globulin, Total: 2 g/dL (ref 1.5–4.5)
Glucose: 109 mg/dL — ABNORMAL HIGH (ref 70–99)
Potassium: 4.9 mmol/L (ref 3.5–5.2)
Sodium: 141 mmol/L (ref 134–144)
Total Protein: 6.5 g/dL (ref 6.0–8.5)
eGFR: 55 mL/min/{1.73_m2} — ABNORMAL LOW (ref >59)

## 2022-05-07 LAB — TSH: TSH: 0.911 u[IU]/mL (ref 0.450–4.500)

## 2022-05-18 ENCOUNTER — Ambulatory Visit (INDEPENDENT_AMBULATORY_CARE_PROVIDER_SITE_OTHER): Payer: Medicare PPO | Admitting: Psychology

## 2022-05-18 DIAGNOSIS — F411 Generalized anxiety disorder: Secondary | ICD-10-CM | POA: Diagnosis not present
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
## 2022-05-31 ENCOUNTER — Encounter: Payer: Self-pay | Admitting: Cardiovascular Disease

## 2022-06-04 ENCOUNTER — Ambulatory Visit: Payer: Medicare PPO | Attending: Cardiovascular Disease | Admitting: Cardiovascular Disease

## 2022-06-04 ENCOUNTER — Encounter: Payer: Self-pay | Admitting: Cardiovascular Disease

## 2022-06-04 DIAGNOSIS — I480 Paroxysmal atrial fibrillation: Secondary | ICD-10-CM | POA: Diagnosis not present

## 2022-06-04 DIAGNOSIS — D6869 Other thrombophilia: Secondary | ICD-10-CM | POA: Diagnosis not present

## 2022-06-04 DIAGNOSIS — G4733 Obstructive sleep apnea (adult) (pediatric): Secondary | ICD-10-CM

## 2022-06-04 DIAGNOSIS — N1831 Chronic kidney disease, stage 3a: Secondary | ICD-10-CM

## 2022-06-04 DIAGNOSIS — I10 Essential (primary) hypertension: Secondary | ICD-10-CM | POA: Diagnosis not present

## 2022-06-04 DIAGNOSIS — M25473 Effusion, unspecified ankle: Secondary | ICD-10-CM

## 2022-06-04 DIAGNOSIS — E669 Obesity, unspecified: Secondary | ICD-10-CM

## 2022-06-04 DIAGNOSIS — E782 Mixed hyperlipidemia: Secondary | ICD-10-CM

## 2022-06-04 MED ORDER — METOPROLOL SUCCINATE ER 50 MG PO TB24: ORAL_TABLET | ORAL | 3 refills | Status: DC

## 2022-06-04 NOTE — Patient Instructions (Signed)
Medication Instructions:  Increase Metoprolol- take 100 mg in the morning, and 75 mg in the evening.   *If you need a refill on your cardiac medications before your next appointment, please call your pharmacy*   Follow-Up: At [facility], you and your health needs are our priority.  As part of our continuing mission to provide you with exceptional heart care, we have created designated Provider Care Teams.  These Care Teams include your primary Cardiologist (physician) and Advanced Practice Providers (APPs -  Physician Assistants and Nurse Practitioners) who all work together to provide you with the care you need, when you need it.  We recommend signing up for the patient portal called "MyChart".  Sign up information is provided on this After Visit Summary.  MyChart is used to connect with patients for Virtual Visits (Telemedicine).  Patients are able to view lab/test results, encounter notes, upcoming appointments, etc.  Non-urgent messages can be sent to your provider as well.   To learn more about what you can do with MyChart, go to [email or web address].    Your next appointment:   2 month(s)  Provider:   Marjie Skiff, PA-C, Azalee Course, PA-C, Bernadene Person, NP, or Carlos Levering, NP    Then, Nicki Guadalajara, MD will plan to see you again in 6 month(s).

## 2022-06-04 NOTE — Progress Notes (Unsigned)
Patient ID: DEMETRES DARRAGH, male   DOB: [birth year], 77 y.o.   MRN: [id number]        HPI: RAYLEE MADANI is a 77 y.o. male who presents for a 2 month follow-up cardiology evaluation.  Mr. Kaseman has a history of hypertension, a remote history of PAF, status post cardioversion in 2007, obstructive sleep apnea for which he uses CPAP 100% of the time, hypertension, and hyperlipidemia. He has had weight fluctuations over the years.  In the past, he also had a history of Mnire's disease.  There was some concern by Dr. Dorma Russell that perhaps this may have been related to statin use, which led to its discontinuance.  He is not had Mnire's disease in some time.  He underwent left hip replacement surgery by Dr. Charlann Boxer and now has been able to continue to be active and exercises routinely.   His  blood pressure regimen has been amlodipine/benazepril 10/40 daily, hydrochlorothiazide has been taking 25 Mill grams in the evening and Toprol-XL 100 mg daily. He had atrial of livalo  For hyperlipidemia but developed some diarrhea secondary to this.    He has not had much success with weight loss over the past several years.    He continues to use his CPAP therapy with 100% compliance.  He will not even take a nap without his CPAP therapy and he takes his CPAP unit on alll his travels.   Since initiating CPAP therapy, he is unaware of any palpitations.  He denies any recurrent atrial fibrillation.  In the past he has had difficulty with family stress and his father had passed away, and his wife's mother recently died after having developed lung CA and had significant PVD.  He was not exercising as much as he had in the past.  He has not been successful in weight loss.  He continues to be active and still walks 3-4 miles per day.  Oftentimes in the morning when he awakens his blood pressure is elevated at 150/90 but after exercise it drops to 128/80.  He is unaware of any recurrent atrial fibrillation.  He  continues to use CPAP with 100% compliance.   He completed a two-year grieving process.  On 01/19/2016 he committed to weight loss.  On January 1, he weighed 278 pounds .  He has been walking 4 miles 5 days per week as well as some intermittent stationary bike.  He has lost a total of 32 pounds since January.  He continues to use CPAP with 100% compliance and since he has been on CPAP, he has not had any recurrent atrial fibrillation and his blood pressure has been less labile.  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  He has had failures to Crestor and Lipitor in the past due to significant myalgias, development, even with weekly dosing.  I resumed zetia   In August 2018 he noticed his heart rate increasing and he presented for evaluation and was seen by Theodore Demark, PAC.  He admitted to a rare palpitation.  He denied any chest pain with exertion or dyspnea on exertion.  He has suffered a broken rib.  This year.  He has a weight goal of 220 pounds.    I saw him in September 2018, at which time he was doing well from a cardiovascular standpoint.   His father was ill and ultimately passed away in [place].  As result he was going back and forth to the [facility] area.  This resulted in a change in his diet and ability to exercise.  I last saw him in October 2019 and his weight had increased from 240 back up to 270 pounds.  He was unaware of any episodes of atrial fibrillation.  He was using CPAP with 100% compliance along with his naps.  He was committed to begin weight loss again.    Since I saw him on October 19, 2018 he has had major lifestyle change.  He has been working with Dr. Dalbert Garnet in the healthy weight and wellness group.  Peak weight was 280 and most recent weight 244.  I saw him for a cardiology evaluation on August 09, 2018.  O ver the past 4 months, he had been under increased stress as result of his wife's illness.  He has been checking his blood pressure regularly and this has  been stable but on his blood pressure recording he has noticed the message of heart rate irregularity.  He is unaware of A. fib but has noticed this message for the last 3 to 4 months.  He is exercising every day and typically walks 2 miles in the morning and in the afternoon or evening does 30 minutes of stationary bike at least 5 to 6 days/week.  He continues to use CPAP.  I obtained a download in the office today from June 22 through August 08, 2018.  He is 100% compliant.  He has a ResMed air sense 10 auto unit with a pressure range of 8-20 with 95% pressure at 10.1 and maximum average and 11.5.  AHI is excellent at 1.2.     During his August 09, 2018 encounter his ECG demonstrated that he was back in atrial fibrillation and had a ventricular rate at 89 bpm.  There was LVH with repolarization changes.  At that time, I recommended discontinuance of amlodipine/benazepril combination and in its place started Cardizem CD 180 mg rather than amlodipine and olmesartan 20 mg rather than benazepril.  I initiated anticoagulation with Eliquis 5 mg twice a day.  He underwent a 2D echo Doppler study on August 16, 2018 which showed EF low normal at 50 to 55%.  There was evidence for significant LVH and moderate dilation of his left atrium.  I saw him for follow-up evaluation on August 23, 2018.  At that time he was now cognizant of his heart rate irregularity.  He has continued to use CPAP with excellent compliance.  A new download was obtained from July 5 through August 21, 2018 which shows 100% compliance with average usage 7 hours and 28 minutes.  AHI is 1.6 and his 95th percentile auto pressure is 10.1 with a maximum average pressure of 11.4.  He states he has lost weight over the past several weeks purposefully.  He continues to exercise now on a stationary bike.  He denies any chest pain PND orthopnea.  He was tolerating Eliquis without bleeding.  During that evaluation, we discussed different options including several  additional weeks of increased weight control versus initiating antiarrhythmic therapy.  Since his only other episode of atrial fibrillation previously occurred 13 years ago and he had not had any recurrence until this year he opted for an increased rate control trial.  As result metoprolol dose was further titrated.  I saw on September 15, 2018 for follow-up evaluation.  At that time remained in atrial fibrillation despite his increase metoprolol succinate to 100 mg in the morning and 50 mg at night with continuation of  diltiazem 180 mg, spironolactone 12.5 mg daily in addition to his olmesartan 20 mg.  I scheduled him for DC cardioversion but because of the need to obtain a COVID test I was unable to schedule this to be done by me prior to going on vacation.  He ultimately underwent the successful cardioversion by Dr. Bufford Buttner successfully and received 1 shock of 200 J of biphasic synchronized rhythm with restoration of sinus rhythm.  Chemistry profile done prior to the cardioversion revealed his creatinine had increased to 1.69 and as result I recommended he reduce his olmesartan down to 10 mg from his dose of 20 mg.  I saw him on October 10, 2018 in follow-up of his cardioversion.  At that time he was maintaining sinus rhythm and feeling well.  He had more energy.  His stress level had significantly reduced since his wife had successful L5-S1 back surgery by Dr. Venetia Maxon.  He was continuing to use CPAP with 100% compliance.    I evaluated him on January 09, 2019.  At that time he stated that over the 3 months previous he had continued to feel well and remained asymptomatic.  At times there are still periods of increased stress.  He continued to use CPAP with 100% compliance.  A download was obtained from November 22 through January 08, 2019 which showed 100% compliance.  Average usage was 6 hours 47 minutes.  AHI is excellent at 1.4 with his AutoSet CPAP minimum pressure set at 8 and maximum of 20, with 95th  percentile pressure 10.2 with maximum average pressure 12.0.  During his evaluation, his ECG verify that he was back in atrial fibrillation.  At the time, he did not inform me that he had noted some irregularity but retrospectively this may have been going on for several weeks prior to that evaluation.  He was continuing to walk daily and exercising on his bike and was asymptomatic without shortness of breath.  During that evaluation I had a long discussion with him regarding possible EP evaluation for consideration of ablation.  After his significant discussion elected to initiate an attempt at antiarrhythmic therapy with low-dose flecainide initially at 50 mg twice a day with plans for office visit in 2 weeks.  He has continued to be on Eliquis for anticoagulation in addition to Zetia for his hyperlipidemia.  When I saw him on January 24, 2019 he stated that he had felt some fatigue once flecainide was instituted.  He was feeling well but this past Sunday he had been working very hard going up and down numerous steps carrying boxes with heavy exertion making at least 40 trips carrying Christmas girls back upstairs.  He was tired.  Sunday evening while sitting down he became disoriented anxious and it appeared that there was possibly some transient stress of aphasia.  He denied any focal weakness.  His symptoms ultimately resolved after 30 minutes.  He had called the office during the workweek and was concerned about his symptoms possibly secondary to flecainide.  He had self reduced his flecainide dose to just once a day rather than twice a day and is worked into my schedule today for follow-up evaluation.  Presently, he has no residual issues since that event several days ago.    At his January 2021 evaluation, I recommended he undergo a neurologic evaluation with Dr. Pearlean Brownie as well as discussed an EP evaluation for possible consideration of future atrial fibrillation ablation.  He saw Dr. Elberta Fortis on February 08, 2019  and was in persistent atrial fibrillation.  Was discussion concerning continuing flecainide with subsequent high voltage cardioversion.  He saw Dr. Pearlean Brownie February 27, 2019 and after a long evaluation it was felt that his transient episode of word finding difficulty and anomia likely represented mild cognitive impairment rather than TIA or seizure.  He subsequently saw Dr Elberta Fortis back on March 05, 2019 and on 323 he underwent successful cardioversion with Dr. Duke Salvia which required 3 shocks with restoration of sinus rhythm.  He was evaluated by me in March 2021 at which time he felt his heart rhythm had remained stable.    He was under considerable increased stress to his wife had fallen in sustained a fracture to her humerus as well as tear to her rotator cuff and biceps tendon.  Ul diltiazem was discontinued and he was started on amlodipine 5 mg and was told to continue olmesartan 20 mg in addition to his metoprolol and spironolactone.  Timately she underwent surgery.  He denies chest pain PND orthopnea.  He has continued to use CPAP download from March 05, 2019 through April 03, 2019 continues to show excellent compliance with an AHI of 2.7 and 95th percentile pressure 11.4 cm.   He was evaluated by Judy Pimple June 20, 2019.  He denies chest pain or shortness of breath.  He had noted some elevated blood pressures as well as some dyspnea on exertion.  During that evaluation his diltiazem was discontinued and he was started on amlodipine 5 mg.  He was told to continue his present dose of olmesartan, metoprolol, spironolactone.  During that evaluation he was maintaining sinus rhythm but had bradycardia and first-degree heart block.  Flecainide was reduced down to 50 mg twice a day since his symptoms of fatigability and dyspnea seem to occur at the higher dose.  With return to a lower dose his symptoms resolved.  I evaluated him in June 2021 over the prior 3 months he denied any recurrent  episodes of chest pain.    He continues to have issues in the first 10 minutes of his CPAP use which I suspect may be related to not having a ramp time on his machine which can easily be adjusted.  His blood pressure does continue to be somewhat labile but he states at home most of the time it is controlled.  He was unaware of any recurrent episodes of atrial fibrillation.  He denied presyncope or syncope.    He underwent a nuclear stress test which apparently had been ordered for dyspnea on exertion.  This was low risk study and demonstrated a hypertensive response to exercise.  A defect was felt to be present in the inferior wall which was interpreted as scar with mild peri-infarct ischemia, which may also have been exacerbated by his body habitus and diaphragmatic attenuation.  Currently he remains asymptomatic and denies chest pain or shortness of breath.  He has been evaluated by Dr. Maximino Sarin for left cochlear Mnire's disease.  He underwent audiogram which showed mild to moderate severity high-frequency sensorineural neural hearing loss in the right ear and mild to moderately severe downsloping sensorineural neural hearing loss in the left ear.   I saw him in October 2021 at which time he denied any recurrent episodes of atrial fibrillation.  He  continued to be on amlodipine 5 mg, metoprolol XL 100 mg in the evening with 25 or 50 mg in the morning, olmesartan 40 mg, and spironolactone 12.5 mg daily.  He was on  Eliquis for anticoagulation.  He wastolerating Livalo 2 mg and Zetia for hyperlipidemia with LDL cholesterol at 55 in June 2021.  He tries to play golf several times a week if possible and continues to score well.  He continues to be followed by Dr. Quillian Quince with plans for continued weight loss.  I saw him in February 2022 and since his prior evaluation he was having issues with his knees.   He had undergone removal of his meniscus.  He has been followed by Terrilee Files he recently had a  hyaluronic acid injection with benefit.  He also has undergone evaluation with Dr. Constance Goltz with discomfort in his right hip.  He has low back issues at L5-S1 and is now on gabapentin.  He is unaware of recurrent atrial fibrillation.  Apparently he is was only taking flecainide 50 mg daily instead of twice a day.  He continued to be on amlodipine 7.5 mg, Eliquis 5 mg twice a day, metoprolol succinate 150 mg in the evening and spironolactone 12.5 mg daily.  At times he he has some mild blood pressure elevation.  He continued to be evaluated at healthy weight and wellness.  Because of his knee and hip issues he has not been playing as much golf as he had in the past.  He continuesd to use CPAP with 100% compliance. A download was obtained from January 20 through March 07, 2020.  He is averaging 7 hours and 2 minutes of CPAP use per night.  AHI is 1.5 with a 95th percentile pressure at 11.5 and maximum average pressure at 13.  During that evaluation, his QTc interval was prolonged at 497 and rather than further increase flecainide I recommended titration of metoprolol to 50 mg in the morning and 100 mg at night.  He continued to be on Livalo 2 mg daily and Zetia for hyperlipidemia.  I saw him on July 17, 2020.  Since his prior evaluation he developed an episode of recurrent atrial fibrillation and was seen by Joni Reining, NP on June 27, 2020 and was scheduled to see Dr. Elberta Fortis on June 30, 2020.  During that evaluation, his flecainide was discontinued and amiodarone 400 mg twice a day was initiated for 2 weeks with plans to reduce to 400 mg for 2 weeks and then 200 mg daily.  He was scheduled to undergo DC cardioversion on July 14 2020 and upon arrival for the procedure he was found to have converted to sinus rhythm and cardioversion was not performed.  When seen by Dr. Elberta Fortis, the patient had already a scheduled right hip replacement surgery to be done by Dr. Charlann Boxer on August 12, 2020.  Dr. Elberta Fortis has scheduled  Mr. Picardo to undergo cardioversion in late August 2022.  He was using CPAP with 100% compliance with AHI of 1.7 and 95th percentile pressure 10.7 with maximum average pressure 12.7 centimeters of water.  He underwent CT cardiac morphology/pulmonary vein prior to his ablation.  Calcium score was 42.7 Aggrastat units which is the 25th percentile for age, race and sex matched control.  He had an upper normal ascending aorta size at 3.9 cm.  He underwent successful atrial fibrillation ablation with Dr. Elberta Fortis on September 02, 2020.  He saw him in follow-up on December 16, 2020 and was maintaining sinus rhythm and was taken off flecainide.  I saw him on January 22, 2021 at which time he was maintaining sinus rhythm and continued to be on  Eliquis 5 mg twice a  day.  He has been on amlodipine 7. 5 mg daily, metoprolol succinate 50 mg twice a day, olmesartan 40 mg daily in addition to spironolactone 12.5 mg daily for hypertension.  He is on Livalo 2 mg with Zetia 10 mg for hyperlipidemia.  He continues to see Dr. Dalbert Garnet at healthy weight and wellness program.  He has resumed his activity and is walking 2 miles in the morning and riding a stationary bike for 30 minutes later in the afternoon.    He underwent an echo Doppler study on July 24, 2021 which showed EF 50 to 55% (54.2% on biplane).  He had moderate left atrial dilatation.  There was mild dilation of his aortic root at 43 mm and aortic arch at 45 mm.  He has been seen by Dr. Elberta Fortis and more recently by Maxine Glenn, PA-C.  He was noted to have very brief SVT on a long-term monitor.  He has continued to use CPAP and a download from September 7 through October 23, 2021 showed average use at 7 hours and 37 minutes per night.  AHI was excellent at 0.9 with his pressure range 6 to 8 to 20 cm of water.  He had never had a colonoscopy recently had a Cologuard test recommended by his primary physician which was positive.  He subsequently underwent colonoscopy by  Dr. Annabelle Harman and a small mass was detected in the colon with biopsy positive for malignancy.  He is undergoing further evaluation with CT imaging with plans to for surgical consultation.    I saw him on December 02, 2021. Since his prior evaluation, he underwent CT imaging and no findings of metastatic disease in the chest abdomen or pelvis was detected.  He is tentatively scheduled to undergo surgery on December 4 with Dr. Marin Olp for resection of his colonic mass.  Presently, he continues to feel well.  He denies any chest pain or shortness of breath.  He continues to be on amlodipine 5 mg, metoprolol succinate 100 mg twice a day, olmesartan 40 mg, and spironolactone 12.5 mg daily for hypertension.  He is on Eliquis anticoagulation and denies bleeding.  He is on Zetia/Livalo for hyperlipidemia.  He has been taking over-the-counter ferrous sulfate.  He continues to use CPAP with 100% compliance.  CPAP setting is 8 to 20 cm of water and AHI is excellent at 0.7 with 95th percentile pressure 10.5 with maximum average pressure 11.9 cm of water.   Since his last evaluation, he underwent successful right hemicolectomy for ascending colon adenocarcinoma on December 21, 2021 by Dr. Marin Olp.  Surgery was uncomplicated and tumor margins were negative.  He has subsequently been seen by Dr. Myrtie Neither of GI March 08, 2022 and was doing well.  He is now trying to get back in shape.  He has had some difficulty with his left knee and has been using a stationary bike rather than walking.  He underwent biopsy and resection of a skin lesion on his neck which was squamous cell CA.   I last saw him on April 12, 2022. presently, he feels well.  He denies any chest pain.  He denies palpitations or any recurrent atrial fibrillation.  He admits to some occasional ankle swelling.  He continues to use CPAP with 100% compliance and has been on CPAP for 17 years.  A download was obtained from February 24 through April 11, 2022 which shows 100% compliance with average use at 7 hours and 15 minutes.  AHI  is excellent at 1.3.  His CPAP is set at a pressure range of 8 to 20 cm.  95th percentile pressure was 10.8 with maximum average pressure 13.0.  Presently he is on amlodipine 7.5 mg, metoprolol succinate which he takes 50 mg twice a day, spironolactone 25 mg alternating with 12.5 mg on Monday Wednesday and Friday.  He is on Zetia 10 mg and Livalo 2 mg daily.  He is on metformin 500 mg daily.  He presents for evaluation.   Past Medical History:  Diagnosis Date   Aortic atherosclerosis (HCC) 12/21/2021   Arthritis    hips    Atrial fibrillation (HCC)    Cataract    Chronic diastolic heart failure (HCC) 12/21/2021   Cochlear Meniere syndrome of left ear 06/09/2015   CPAP (continuous positive airway pressure) dependence    Dysrhythmia    PAF- hx of 7 years ago    HLD (hyperlipidemia)    Hypertension 05/29/2010   ECHO-EF 67% wnl   Meniere disease    Obesity    OSA on CPAP 08/20/2012   Palpitations    S/P right total hip arthroplasty 08/12/2020   Sleep apnea 09/14/2004   [facility]- Dr. Welton Flakes; CPAP titration 11/12/04- Dr. Welton Flakes    Past Surgical History:  Procedure Laterality Date   ATRIAL FIBRILLATION ABLATION N/A 09/10/2020   Procedure: ATRIAL FIBRILLATION ABLATION;  Surgeon: Regan Lemming, MD;  Location: [facility] INVASIVE CV LAB;  Service: Cardiovascular;  Laterality: N/A;   CARDIAC CATHETERIZATION  11/03/2005   CARDIOVERSION N/A 09/26/2018   Procedure: CARDIOVERSION;  Surgeon: Sande Rives, MD;  Location: [facility] ENDOSCOPY;  Service: Endoscopy;  Laterality: N/A;   CARDIOVERSION N/A 03/13/2019   Procedure: CARDIOVERSION;  Surgeon: Chilton Si, MD;  Location: [facility] ENDOSCOPY;  Service: Cardiovascular;  Laterality: N/A;   CARDIOVERSION N/A 03/03/2021   Procedure: CARDIOVERSION;  Surgeon: Wendall Stade, MD;  Location: [facility] ENDOSCOPY;  Service: Cardiovascular;  Laterality: N/A;    CATARACT EXTRACTION  01/19/2012   KNEE ARTHROSCOPY     left knee- 2002    LAPAROSCOPIC PARTIAL RIGHT COLECTOMY Right 12/21/2021   Procedure: LAPAROSCOPIC RIGHT HEMICOLECTOMY;  Surgeon: Andria Meuse, MD;  Location: [facility] ORS;  Service: General;  Laterality: Right;   TOTAL HIP ARTHROPLASTY  06/08/2011   Procedure: TOTAL HIP ARTHROPLASTY ANTERIOR APPROACH;  Surgeon: Shelda Pal, MD;  Location: [facility] ORS;  Service: Orthopedics;  Laterality: Left;   TOTAL HIP ARTHROPLASTY Right 08/12/2020   Procedure: TOTAL HIP ARTHROPLASTY ANTERIOR APPROACH;  Surgeon: Durene Romans, MD;  Location: [facility] ORS;  Service: Orthopedics;  Laterality: Right;    No Known Allergies   Current Outpatient Medications  Medication Sig Dispense Refill   amLODipine (NORVASC) 10 MG tablet Take 1 tablet (10 mg total) by mouth daily. 90 tablet 3   Coenzyme Q10 200 MG capsule Take 200 mg by mouth in the morning.     ELIQUIS 5 MG TABS tablet TAKE 1 TABLET BY MOUTH TWICE A DAY 180 tablet 1   ezetimibe (ZETIA) 10 MG tablet TAKE 1 TABLET (10 MG TOTAL) BY MOUTH IN THE MORNING 90 tablet 1   ferrous sulfate 325 (65 FE) MG EC tablet Take 325 mg by mouth in the morning and at bedtime.     metFORMIN (GLUCOPHAGE) 500 MG tablet TAKE 1 TABLET BY MOUTH EVERY DAY WITH BREAKFAST 90 tablet 0   metoprolol succinate (TOPROL-XL) 50 MG 24 hr tablet Take 75 mg  ( 1 and 1/2 tablet of 50 mg )  in the morning  and 50 mg ( 1 tablet ) in the evening 135 tablet 3   Multiple Vitamins-Minerals (MULTIVITAMIN WITH MINERALS) tablet Take 1 tablet by mouth daily.     Pitavastatin Calcium (LIVALO) 2 MG TABS Take 1 tablet (2 mg total) by mouth every evening. 90 tablet 1   spironolactone (ALDACTONE) 25 MG tablet Take 1 tablet (25 mg total) by mouth daily. 135 tablet 1   tamsulosin (FLOMAX) 0.4 MG CAPS capsule Take 0.4 mg by mouth in the morning.     No current facility-administered medications for this visit.    Socially he is remarried after his first wife passed  away. He has 2 children. He is retired  and was the Charity fundraiser at [employer]. there is no tobacco history.  He plays golf at least 2 days/week and this past year not only did he have a hole in 1 but he has also shot his age.  ROS General: Negative; No fevers, chills, or night sweats;  HEENT: Negative; No changes in vision or hearing, sinus congestion, difficulty swallowing Pulmonary: Negative; No cough, wheezing, shortness of breath, hemoptysis Cardiovascular: See HPI GI: Recent colonoscopy with colonic mass; status post right hemicolectomy for ascending colon adenocarcinoma GU: Negative; No dysuria, hematuria, or difficulty voiding Musculoskeletal: He is status post rib fracture.  He is status post hip replacement.  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 Psychiatric: Negative; No behavioral problems, depression Sleep: Positive for obstructive sleep apnea on CPAP therapy with 100% compliance No snoring, daytime sleepiness, hypersomnolence, bruxism, restless legs, hypnogognic hallucinations, no cataplexy Other comprehensive 14 point system review is negative.  PE BP (!) 154/90 (BP Location: Left Arm, Patient Position: Sitting, Cuff Size: Large)   Pulse 94   Ht 6\' 3"  (1.905 m)   Wt 266 lb 6.4 oz (120.8 kg)   SpO2 96%   BMI 33.30 kg/m    Repeat blood pressure by me was initially elevated at 160/100.  Repeat 154/96.  He states his blood pressure at home typically runs around 125-130 systolically.  Wt Readings from Last 3 Encounters:  06/04/22 266 lb 6.4 oz (120.8 kg)  04/28/22 268 lb (121.6 kg)  04/12/22 268 lb (121.6 kg)  In 2021 he had lost approximately 40 pounds   General: Alert, oriented, no distress.  Skin: normal turgor, no rashes, warm and dry HEENT:  Normocephalic, atraumatic. Pupils equal round and reactive to light; sclera anicteric; extraocular muscles intact; Nose without nasal septal hypertrophy Mouth/Parynx benign; Mallinpatti scale 3 Neck: No JVD, no carotid bruits; normal carotid upstroke Lungs: clear to ausculatation and percussion; no wheezing or rales Chest wall: without tenderness to palpitation Heart: PMI not displaced, RRR, s1 s2 normal, 1/6 systolic murmur, no diastolic murmur, no rubs, gallops, thrills, or heaves Abdomen: Central adiposity; soft, nontender; no hepatosplenomehaly, BS+; abdominal aorta nontender and not dilated by palpation. Back: no CVA tenderness Pulses 2+ Musculoskeletal: full range of motion, normal strength, no joint deformities Extremities: no clubbing cyanosis or edema, Homan's sign negative  Neurologic: grossly nonfocal; Cranial nerves grossly wnl Psychologic: Normal mood and affect   Jun 04, 2022 ECG (independently read by me): NSR at 94, RBBB, LAHB  April 12, 2022 ECG (independently read by me): NSR at 89 RBBB, LAHB   December 02, 2021 ECG (independently read by me):  NSR at 72, RBBB, LAD, QTc  479 msec  October 26, 2021 ECG (independently read by me): NSR at 77; RBBB, LAHB, LVH; QTc 484 msec   January 22, 2021 ECG (independently read by me):  NSR at 76, RBBB: PR 200 msec  July 17, 2020 ECG (independently read by me): NSR at 70; RBBB, 1st degree AV block; PR 220 msec; QTc 492 msec  March 10, 2020 ECG (independently read by me): NSR at 82; RBBB with repoarization, QTc 497 msec  November 06, 2019 ECG (independently read by me): Sinus rhythm at 64; 1st degree AV block; PR 212 msec;RBBB  July 09, 2019 ECG (independently read by me): Normal sinus rhythm at 70 bpm, first-degree AV block with a.  PR interval at 212 ms.  Right bundle branch block with repolarization changes.  Borderline LVH by voltage criteria.  QTc interval 470 ms.  April 04, 2019 ECG (independently read by me): Sinus bradycardia  59 bpm, first-degree AV block with a PR of 248 ms.  LVH.  Inferior Q waves, unchanged  January 24, 2019 ECG (independently read by me): Atrial fibrillation at 83 bpm.  Borderline LVH by voltage criteria in aVL; QTc interval 446 ms.  January 09, 2019 ECG (independently read by me): Atrial fibrillation ar 97, RBBB; Left axis deviation; Inferior Q waves, unchanged. QTc 487    August 09, 2018 ECG (independently read by me): Normal sinus rhythm at 72 bpm.  Left Axis deviation.  QTc interval 429 ms.  September 15, 2018 ECG (independently read by me): Atrial fibrillation at 92 bpm.  Right bundle branch block with repolarization changes.  August 23, 2018 ECG (independently read by me): Atrial fibrillation at 96 bpm.  LVH with repolarization changes.  Left axis deviation.  Left anterior hemiblock.  Inferior Q waves  August 09, 2018 ECG (independently read by me):Atrial fibrillatoion at 68; LVH with repolarization  October 219 ECG (independently read by me): Normal sinus rhythm at 81 bpm.  Nonspecific intraventricular conduction delay.  Left axis deviation  September 2018 ECG (independently read by me): Normal sinus rhythm at 63 bpm.  Left bundle branch block with repolarization changes.  Normal intervals.  No ectopy.  April 2018 ECG (independently read by me): Normal sinus rhythm at 68 bpm.  LVH with repolarization.  Normal intervals.  November 2017 ECG (independently read by me): Normal sinus rhythm at 78 bpm.  Nonspecific interventricular delay.  QRS complex V1 V2.  August 2016 ECG (independently read by me): Normal sinus rhythm at 77 bpm.  LVH with repolarization changes.  Left axis deviation.  December 2015 ECG (independently read by me): Normal sinus rhythm at 87 bpm.  LVH by voltage criteria with T-wave changes in leads 1 and aVL.  July 2014 ECG: Normal sinus rhythm at 76 beats per minute. LVH with QRS widening.  LABS:     Latest Ref Rng & Units 05/06/2022   10:52 AM 12/24/2021    1:26 PM  12/24/2021    5:16 AM  BMP  Glucose 70 - 99 mg/dL 161  096  045   BUN 8 - 27 mg/dL 25  37  30   Creatinine 0.76 - 1.27 mg/dL 4.09  8.11  9.14   BUN/Creat Ratio 10 - 24 19     Sodium 134 - 144 mmol/L 141  135  137   Potassium 3.5 - 5.2 mmol/L 4.9  4.5  4.8   Chloride 96 - 106 mmol/L 105  106  108   CO2 20 - 29 mmol/L 21  21  20   Calcium 8.6 - 10.2 mg/dL 13.0  9.7  9.6       Latest Ref Rng & Units 05/06/2022   10:52 AM 10/26/2021    9:53 AM 08/05/2021    8:19 AM  Hepatic Function  Total Protein 6.0 - 8.5 g/dL 6.5  7.3  6.3   Albumin 3.8 - 4.8 g/dL 4.5  4.4  4.3   AST 0 - 40 IU/L 31  21  22    ALT 0 - 44 IU/L 29  20  20    Alk Phosphatase 44 - 121 IU/L 74  63  55   Total Bilirubin 0.0 - 1.2 mg/dL 0.9  0.7  0.9       Latest Ref Rng & Units 05/06/2022   10:52 AM 12/24/2021    5:16 AM 12/23/2021    4:57 AM  CBC  WBC 3.4 - 10.8 x10E3/uL 5.2  6.9  8.5   Hemoglobin 13.0 - 17.7 g/dL 86.5  78.4  69.6   Hematocrit 37.5 - 51.0 % 46.8  40.7  40.6   Platelets 150 - 450 x10E3/uL 189  184  189    Lab Results  Component Value Date   MCV 91 05/06/2022   MCV 89.1 12/24/2021   MCV 89.0 12/23/2021   Lab Results  Component Value Date   TSH 0.911 05/06/2022    Lab Results  Component Value Date   HGBA1C 5.2 12/08/2021    Lipid Panel     Component Value Date/Time   CHOL 114 05/06/2022 1052   TRIG 52 05/06/2022 1052   HDL 52 05/06/2022 1052   CHOLHDL 2.2 05/06/2022 1052   CHOLHDL 2 08/05/2021 0819   VLDL 10.0 08/05/2021 0819   LDLCALC 50 05/06/2022 1052   LDLCALC 119 (H) 10/12/2016 0902     RADIOLOGY: No results found.  IMPRESSION:  No diagnosis found.    ASSESSMENT AND PLAN: Mr Henschen is a 77 year old gentleman who has a long-standing history of hypertension and remotely had been on amlodipine/benazepril 10/40, HCTZ 25 mg, Toprol-XL 100 mg in addition to spironolactone 12.5 mg daily.  He had had an iisolated episode of atrial fibrillation in 2007 and had undergone  cardioversion.  On this regimen his blood pressure had stabilized and consistently was well controlled and he had not had any recurrent episodes of atrial fibrillation for many years.  He had gained significant amount of weight since August 2019.  He initiated a major lifestyle change and has been working closely with Dr. Dalbert Garnet from [facility] resulting in a greater than 40 pound weight loss in 2021.   When I saw him in late July 2020 he was in atrial fibrillation of questionable duration.  At the time retrospectively he believes he may have been in A. fib for 3 to 4 months duration.  I gradually titrated his medical regimen and changed his benazepril to diltiazem and olmesartan to take in addition to metoprolol succinate and his spironolactone 12.5 mg daily.  He had continued to use CPAP with 100% compliance.  His only previous episode of A. fib prior to this episode had occurred in 2007 and he had maintained sinus rhythm until his recurrent episode.  He underwent successful cardioversion on September 26, 2018 with restoration of sinus rhythm.  When I saw him on January 09, 2019 he was continuing to have periods of increased stress and ECG again showed recurrent atrial fibrillation of questionable duration.  Retrospectively he felt he may  have been in atrial fibrillation for 3 to 4 weeks duration prior to that visit.  He also had an episode of disorientation with possible transient expressive aphasia and underwent neurologic evaluation with Dr. Pearlean Brownie and also EP evaluation with Dr. Elberta Fortis.  He was felt that his word finding difficulty and anomia most likely represented mild cognitive impairment rather than a TIA or stroke.  He had been on increased dose of flecainide up to 100 mg twice a day after his subsequent cardioversion. An echo Doppler study in January 2021 showed an EF of 50 to 55% without regional wall motion abnormalities.  When I last saw him in February 2022 he was asymptomatic  without chest pain or shortness of breath and was maintaining sinus rhythm.  He subsequently reverted back to atrial fibrillation and his flecainide dose was discontinued and he was started on amiodarone initially 400 mg twice a day when seen by Dr. Elberta Fortis on June 30, 2020.  He had pharmacologically converted to sinus rhythm when he presented for outpatient DC cardioversion on February 14, 2020.  He underwent right successful hip replacement surgery by Dr. Charlann Boxer and also successful atrial fibrillation ablation by Dr. Elberta Fortis.  He has been maintaining sinus rhythm and is now off flecainide and amiodarone.  He had never undergone prior colonoscopy but was recently found to have a colonic mass noted on colonoscopy after a Cologuard test was positive.  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  But there was no findings of metastatic disease in the chest, abdomen or pelvis.  He did have probable cholelithiasis.  His most recent echo Doppler study from July 2023 shows EF at 50 to 55% (54.2% by biplane).  There was mild LVH and mild grade 1 diastolic dysfunction.  His left atrium was moderately dilated and there was mild dilatation of his aortic root measuring 43 mm, ascending aorta at 42 mm and aortic arch at 45 mm.  At his November 2023 evaluation, he was given clearance to undergo planned surgery.  He underwent successful right hemicolectomy on December 21, 2021 by Dr. Marin Olp.  Clinically he has done remarkably well with reference to this.  He also recently had undergone biopsy and resection of small left squamous cell carcinoma of his neck.  Weight today is 268 pounds with his goal set at less than 220.  He has been trying to get back in shape.  He has been undergoing injections in his left knee and has been using a stationary bike.  His blood pressure today is elevated despite his current medical regimen.  He has 1+ ankle edema. I have recommended titration of amlodipine from  7.5 mg to 10 mg.  I am increasing spironolactone to 25 mg daily from 25 alternating with 12.5 mg every other day.  I have also recommended slight titration of metoprolol succinate from 50 mg twice a day to 75 mg in the morning and 50 mg at night.  Apparently, since his last visit he has been off olmesartan.  I am recommending follow-up laboratory with a comprehensive metabolic panel, CBC TSH and lipid studies.  He will monitor his blood pressure.  I will contact him regarding results of laboratory.  Weight loss was encouraged.  I will see him in 2 to 3 months for follow-up evaluation or sooner as needed.    Lennette Bihari, MD, [facility]  06/04/2022 3:45 PM

## 2022-06-06 ENCOUNTER — Encounter: Payer: Self-pay | Admitting: Cardiovascular Disease

## 2022-06-08 NOTE — Progress Notes (Signed)
[name] [address] [place] [address] Phone: [phone] Subjective:   Clayton [name], am serving as a scribe for Dr. Antoine Primas.  I'm seeing this patient by the request  of:  Kuneff, Renee A, DO  CC: Back and neck pain follow-up  [id number]  Clayton [name] is a 77 y.o. male coming in with complaint of back and neck pain. OMT on 04/28/2022. Patient states that using TENS unit for knee pain. L knee pain is better than the R knee.   Medications patient has been prescribed:   Taking:         Reviewed prior external information including notes and imaging from previsou exam, outside providers and external EMR if available.   As well as notes that were available from care everywhere and other healthcare systems.  Past medical history, social, surgical and family history all reviewed in electronic medical record.  No pertanent information unless stated regarding to the chief complaint.   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 Welton Flakes; CPAP titration 11/12/04- Dr. Welton Flakes    No Known Allergies   Review of Systems:  No headache, visual changes, nausea, vomiting, diarrhea, constipation, dizziness, abdominal pain, skin rash, fevers, chills, night sweats, weight loss, swollen lymph nodes, body aches, joint swelling, chest pain, shortness of breath, mood changes. POSITIVE muscle  aches  Objective  Blood pressure 124/84, pulse 74, height 6\' 3"  (1.905 m), SpO2 98 %.   General: No apparent distress alert and oriented x3 mood and affect normal, dressed appropriately.  HEENT: Pupils equal, extraocular movements intact  Respiratory: Patient's speak in full sentences and does not appear short of breath  Cardiovascular: No lower extremity edema, non tender, no erythema  Low back exam does have some mild loss of lordosis noted.  Does have some tenderness when reaching up or hip flexion on the right side.  Patient has negative straight leg test noted.  Tightness with FABER test on the right greater than left.  Osteopathic findings  C3 flexed rotated and side bent right C7 flexed rotated and side bent left T3 extended rotated and side bent right inhaled rib T9 extended rotated and side bent left L1 flexed rotated and side bent right Sacrum right on right       Assessment and Plan:  Degenerative disc disease, lumbar Known arthritic changes.  Discussed with patient icing regimen and home exercises.  Discussed continuing to work on core stability.  Likely patient has been very active.  Patient is starting to have some mild right hip pain 2 years after the replacement.  We discussed if worsening symptoms we will need to consider the possibility of a bone scan.  Hopeful that patient will continue to make improvement.  Follow-up again in 6 to 8 weeks otherwise.    Nonallopathic  problems  Decision today to treat with OMT was based on Physical Exam  After verbal consent patient was treated with HVLA, ME, FPR techniques in cervical, rib, thoracic, lumbar, and sacral  areas  Patient tolerated the procedure well with improvement in symptoms  Patient given exercises, stretches and lifestyle modifications  See medications in patient instructions if given  Patient will follow up in 4-8 weeks     The above documentation has been reviewed and is accurate and complete  Judi Saa, DO         Note: This dictation was prepared with Dragon dictation along with smaller phrase technology. Any transcriptional errors that result from this process are unintentional.

## 2022-06-09 ENCOUNTER — Ambulatory Visit: Payer: Medicare PPO | Admitting: Family Medicine

## 2022-06-09 VITALS — BP 124/84 | HR 74 | Ht 75.0 in

## 2022-06-09 DIAGNOSIS — M9901 Segmental and somatic dysfunction of cervical region: Secondary | ICD-10-CM

## 2022-06-09 DIAGNOSIS — M9908 Segmental and somatic dysfunction of rib cage: Secondary | ICD-10-CM | POA: Diagnosis not present

## 2022-06-09 DIAGNOSIS — M9904 Segmental and somatic dysfunction of sacral region: Secondary | ICD-10-CM

## 2022-06-09 DIAGNOSIS — M9902 Segmental and somatic dysfunction of thoracic region: Secondary | ICD-10-CM | POA: Diagnosis not present

## 2022-06-09 DIAGNOSIS — M9903 Segmental and somatic dysfunction of lumbar region: Secondary | ICD-10-CM | POA: Diagnosis not present

## 2022-06-09 DIAGNOSIS — M5136 Other intervertebral disc degeneration, lumbar region: Secondary | ICD-10-CM

## 2022-06-10 ENCOUNTER — Encounter: Payer: Self-pay | Admitting: Family Medicine

## 2022-06-10 NOTE — Assessment & Plan Note (Signed)
Known arthritic changes.  Discussed with patient icing regimen and home exercises.  Discussed continuing to work on core stability.  Likely patient has been very active.  Patient is starting to have some mild right hip pain 2 years after the replacement.  We discussed if worsening symptoms we will need to consider the possibility of a bone scan.  Hopeful that patient will continue to make improvement.  Follow-up again in 6 to 8 weeks otherwise.

## 2022-06-15 ENCOUNTER — Ambulatory Visit (INDEPENDENT_AMBULATORY_CARE_PROVIDER_SITE_OTHER): Payer: Medicare PPO | Admitting: Psychology

## 2022-06-15 DIAGNOSIS — F411 Generalized anxiety disorder: Secondary | ICD-10-CM

## 2022-06-15 NOTE — Progress Notes (Signed)
Behavioral Health Counselor/Therapist Progress Note  Patient ID: Clayton [name], MRN: [id number]    Date: 06/15/22  Time Spent: 2:05  pm - 3:00 pm : 55 Minutes  Treatment Type: Individual Therapy.  Reported Symptoms: anxiety.   Mental Status Exam: Appearance:  Neat and Well Groomed     Behavior: Appropriate  Motor: Normal  Speech/Language:  Clear and Coherent  Affect: Appropriate  Mood: normal  Thought process: normal  Thought content:   WNL  Sensory/Perceptual disturbances:   WNL  Orientation: oriented to person, place, time/date, and situation  Attention: Good  Concentration: Good  Memory: WNL  Fund of knowledge:  Good  Insight:   Good  Judgment:  Good  Impulse Control: Good   Risk Assessment: Danger to Self:  No Self-injurious Behavior: No Danger to Others: No Duty to Warn:no Physical Aggression / Violence:No  Access to Firearms a concern: No  Gang Involvement:No   Subjective:   Clayton [name] participated in the session, in person in the office with the therapist, and consented to treatment Clayton [name] reviewed the events of the past week. We reviewed numerous treatment approaches including CBT, BA, Problem Solving, and Solution focused therapy. Psych-education regarding the Clayton [name] diagnosis of Generalized anxiety disorder was provided during the session. We discussed Clayton [name]'s goals treatment goals which include manage his anxiety, specifically morning anxiety, including engaging in more mindfulness, processing past events, manage day to day stress,  process his anxiety (specifically upon waking), process loss of first wife and the effect of this on his anxiety in relation to his current wife, Clayton [name] (labeled PTSD by Clayton [name]). Clayton [name] provided verbal approval of the treatment plan. He noted his anxiety in the am including stomach nausea and tightness, tightness in chest, and feeling "heavy headed". He noted feeling "wobbly" for a few moments prior  to regaining equilibrium. He noted having full work-ups to no avail and being told this I possibility a neurological issue. He noted his biggest trigger to anxiety is his wife's health due to the traumatic loss of his 1st wife who passed due to cancer.    Interventions: Psycho-education & Goal Setting.   Diagnosis:   Generalized anxiety disorder  Psychiatric Treatment: No , NA  Treatment Plan:  Client Abilities/Strengths Clayton [name] is intelligent, forthcoming, and motivated for change.   Support System: Family  Client Treatment Preferences Outpatient Therapy.   Client Statement of Needs Clayton [name] would like to manage his anxiety, specifically morning anxiety, including engaging in more mindfulness, processing past events, manage day to day stress,  process his anxiety (specifically upon waking), process loss of first wife and the effect of this on his anxiety in relation to his current wife, Clayton [name] (labeled PTSD by Clayton [name]).   Treatment Level Weekly  Symptoms  Anxiety: anxiety, difficulty managing worry, worrying about different things.   (Status: maintained)  Goals:   Clayton [name] experiences symptoms of Anxiety.   Treatment plan signed and available on s-drive:  No, pending patient signature.     Target Date: 06/15/23 Frequency: Weekly  Progress: 0 Modality: individual    Therapist will provide referrals for additional resources as appropriate.  Therapist will provide psycho-education regarding Clayton [name] diagnosis and corresponding treatment approaches and interventions. Licensed Clinical Social Worker, [place], LCSW will support the patient's ability to achieve the goals identified. will employ CBT, BA, Problem-solving, Solution Focused, Mindfulness,  coping skills, & other evidenced-based practices will be used to promote progress towards healthy functioning to help manage decrease symptoms associated  with his diagnosis.   Reduce overall level, frequency, and intensity of the  feelings of depression, anxiety  evidenced by decreased overall symptoms from 6 to 7 days/week to 0 to 1 days/week per client report for at least 3 consecutive months. Verbally express understanding of the relationship between feelings of depression, anxiety and their impact on thinking patterns and behaviors. Verbalize an understanding of the role that distorted thinking plays in creating fears, excessive worry, and ruminations.  Clayton [name] participated in the creation of the treatment plan)    Delight Ovens, LCSW

## 2022-06-24 ENCOUNTER — Other Ambulatory Visit: Payer: Self-pay | Admitting: Family Medicine

## 2022-06-24 DIAGNOSIS — R7303 Prediabetes: Secondary | ICD-10-CM

## 2022-06-25 ENCOUNTER — Telehealth: Payer: Self-pay | Admitting: Family Medicine

## 2022-06-25 DIAGNOSIS — R7303 Prediabetes: Secondary | ICD-10-CM

## 2022-06-25 MED ORDER — METFORMIN HCL 500 MG PO TABS: ORAL_TABLET | ORAL | 0 refills | Status: DC

## 2022-06-25 NOTE — Telephone Encounter (Signed)
Patient is requesting refill on metformin. Pharmacy is confirmed as [facility] in [place].   metFORMIN (GLUCOPHAGE) 500 MG tablet

## 2022-06-25 NOTE — Telephone Encounter (Signed)
Spoke with patient regarding results/recommendations.  

## 2022-06-25 NOTE — Telephone Encounter (Signed)
Edit patient called his pharmacy which told him to call us.

## 2022-06-29 ENCOUNTER — Ambulatory Visit: Payer: Medicare PPO | Admitting: Family Medicine

## 2022-06-29 ENCOUNTER — Encounter: Payer: Self-pay | Admitting: Family Medicine

## 2022-06-29 VITALS — BP 153/68 | HR 68 | Temp 98.4°F | Wt 267.0 lb

## 2022-06-29 DIAGNOSIS — R7303 Prediabetes: Secondary | ICD-10-CM | POA: Diagnosis not present

## 2022-06-29 DIAGNOSIS — C189 Malignant neoplasm of colon, unspecified: Secondary | ICD-10-CM

## 2022-06-29 DIAGNOSIS — E782 Mixed hyperlipidemia: Secondary | ICD-10-CM

## 2022-06-29 DIAGNOSIS — Z6835 Body mass index (BMI) 35.0-35.9, adult: Secondary | ICD-10-CM | POA: Diagnosis not present

## 2022-06-29 DIAGNOSIS — I4819 Other persistent atrial fibrillation: Secondary | ICD-10-CM

## 2022-06-29 DIAGNOSIS — I10 Essential (primary) hypertension: Secondary | ICD-10-CM

## 2022-06-29 DIAGNOSIS — I5032 Chronic diastolic (congestive) heart failure: Secondary | ICD-10-CM

## 2022-06-29 LAB — POCT GLYCOSYLATED HEMOGLOBIN (HGB A1C)
HbA1c POC (<> result, manual entry): 5.6 % (ref 4.0–5.6)
HbA1c, POC (controlled diabetic range): 5.6 % (ref 0.0–7.0)
HbA1c, POC (prediabetic range): 5.6 % — AB (ref 5.7–6.4)
Hemoglobin A1C: 5.6 % (ref 4.0–5.6)

## 2022-06-29 MED ORDER — METFORMIN HCL 500 MG PO TABS: ORAL_TABLET | ORAL | 1 refills | Status: DC

## 2022-06-29 NOTE — Progress Notes (Signed)
Clayton [name] , 1945/11/08, 77 y.o., male MRN: [id number] Patient Care Team    Relationship Specialty Notifications Start End  Natalia Leatherwood, DO PCP - General Family Medicine  06/09/15   Lennette Bihari, MD PCP - Cardiology Cardiology  10/10/18   Regan Lemming, MD PCP - Electrophysiology Cardiology Admissions 02/08/19   Lennette Bihari, MD PCP - Sleep Medicine Cardiology  04/12/22   Lennette Bihari, MD Consulting Physician Cardiology  06/09/15   Sallye Lat, MD Consulting Physician Ophthalmology  06/09/15   Elise Benne, MD Consulting Physician Ophthalmology  06/09/15   Bufford Buttner, MD Consulting Physician Dermatology  06/09/15   Ermalinda Barrios, MD Consulting Physician Otolaryngology  06/09/15   Durene Romans, MD Consulting Physician Orthopedic Surgery  06/09/15   Crist Fat, MD Attending Physician Urology  07/27/21     Chief Complaint  Patient presents with   Prediabetes    cmc     Subjective: Clayton [name] is a 77 y.o. Pt presents for an OV to follow up on his prediabetes. He had been started on metformin by another provider. Highest A1c 6.0 in EMR. He is exercising to tolerance of his orthopedic conditions.  He is working with Dr.Beasley on weight loss . He is following with cardiology routinely and they manage all cardiac meds.   His colon cancer follow up is scheduled for 12/2022.   Reviewed recent labs and calcium elevated to 10.6 (normal <10.2). He does not take a calcium supplement.      04/14/2022    7:56 AM 01/01/2022   10:25 AM 07/27/2021    9:04 AM 07/04/2020    1:32 PM 11/01/2017    8:40 AM  Depression screen PHQ 2/9  Decreased Interest 0 0 0 0 1  Down, Depressed, Hopeless 0 0 0 0 0  PHQ - 2 Score 0 0 0 0 1  Altered sleeping     3  Tired, decreased energy     1  Change in appetite     0  Feeling bad or failure about yourself      0  Trouble concentrating     0  Moving slowly or fidgety/restless     0  Suicidal thoughts     0   PHQ-9 Score     5  Difficult doing work/chores     Not difficult at all    No Known Allergies Social History   Social History Narrative   Married. Porfirio Mylar.Takes care of his mother-in-law as well.    Retired, Manufacturing engineer.    Drinks caffeine beverages. Takes a daily vitamin.   Exercises routinely.    Smoke detector in the home.    Ambulates independently.    2-sons   Past Medical History:  Diagnosis Date   Aortic atherosclerosis (HCC) 12/21/2021   Arthritis    hips    Atrial fibrillation (HCC)    Cataract    Chronic diastolic heart failure (HCC) 12/21/2021   Cochlear Meniere syndrome of left ear 06/09/2015   CPAP (continuous positive airway pressure) dependence    Dysrhythmia    PAF- hx of 7 years ago    HLD (hyperlipidemia)    Hypertension 05/29/2010   ECHO-EF 67% wnl   Meniere disease    Obesity    OSA on CPAP 08/20/2012   Palpitations    S/P right total hip arthroplasty 08/12/2020   Sleep apnea 09/14/2004   [facility]- Dr. Welton Flakes;  CPAP titration 11/12/04- Dr. Welton Flakes   Past Surgical History:  Procedure Laterality Date   ATRIAL FIBRILLATION ABLATION N/A 09/10/2020   Procedure: ATRIAL FIBRILLATION ABLATION;  Surgeon: Regan Lemming, MD;  Location: [facility] INVASIVE CV LAB;  Service: Cardiovascular;  Laterality: N/A;   CARDIAC CATHETERIZATION  11/03/2005   CARDIOVERSION N/A 09/26/2018   Procedure: CARDIOVERSION;  Surgeon: Sande Rives, MD;  Location: [facility] ENDOSCOPY;  Service: Endoscopy;  Laterality: N/A;   CARDIOVERSION N/A 03/13/2019   Procedure: CARDIOVERSION;  Surgeon: Chilton Si, MD;  Location: [facility] ENDOSCOPY;  Service: Cardiovascular;  Laterality: N/A;   CARDIOVERSION N/A 03/03/2021   Procedure: CARDIOVERSION;  Surgeon: Wendall Stade, MD;  Location: [facility] ENDOSCOPY;  Service: Cardiovascular;  Laterality: N/A;   CATARACT EXTRACTION  01/19/2012   KNEE ARTHROSCOPY     left knee- 2002    LAPAROSCOPIC PARTIAL RIGHT COLECTOMY Right  12/21/2021   Procedure: LAPAROSCOPIC RIGHT HEMICOLECTOMY;  Surgeon: Andria Meuse, MD;  Location: [facility] ORS;  Service: General;  Laterality: Right;   TOTAL HIP ARTHROPLASTY  06/08/2011   Procedure: TOTAL HIP ARTHROPLASTY ANTERIOR APPROACH;  Surgeon: Shelda Pal, MD;  Location: [facility] ORS;  Service: Orthopedics;  Laterality: Left;   TOTAL HIP ARTHROPLASTY Right 08/12/2020   Procedure: TOTAL HIP ARTHROPLASTY ANTERIOR APPROACH;  Surgeon: Durene Romans, MD;  Location: [facility] ORS;  Service: Orthopedics;  Laterality: Right;   Family History  Problem Relation Age of Onset   Hypertension Mother    Stroke Mother    Diabetes Mother    Hyperlipidemia Mother    Obesity Mother    Arthritis Father    Heart disease Father    Diabetes Sister    Stroke Maternal Grandmother        23 died    Heart disease Maternal Grandfather    Hypertension Maternal Grandfather    Diabetes Maternal Grandfather    Heart disease Paternal Grandfather    Allergies as of 06/29/2022   No Known Allergies      Medication List        Accurate as of June 29, 2022  9:18 AM. If you have any questions, ask your nurse or doctor.          amLODipine 10 MG tablet Commonly known as: NORVASC Take 1 tablet (10 mg total) by mouth daily.   Coenzyme Q10 200 MG capsule Take 200 mg by mouth in the morning.   Eliquis 5 MG Tabs tablet Generic drug: apixaban TAKE 1 TABLET BY MOUTH TWICE A DAY   ezetimibe 10 MG tablet Commonly known as: ZETIA TAKE 1 TABLET (10 MG TOTAL) BY MOUTH IN THE MORNING   ferrous sulfate 325 (65 FE) MG EC tablet Take 325 mg by mouth in the morning and at bedtime.   metFORMIN 500 MG tablet Commonly known as: GLUCOPHAGE TAKE 1 TABLET BY MOUTH EVERY DAY WITH BREAKFAST   metoprolol succinate 50 MG 24 hr tablet Commonly known as: TOPROL-XL Take 100 mg (2 tablets) by mouth in the morning, and 75 mg (1.5 tablets) in the evening.   multivitamin with minerals tablet Take 1 tablet by mouth daily.    Pitavastatin Calcium 2 MG Tabs Commonly known as: Livalo Take 1 tablet (2 mg total) by mouth every evening.   spironolactone 25 MG tablet Commonly known as: ALDACTONE Take 1 tablet (25 mg total) by mouth daily.   tamsulosin 0.4 MG Caps capsule Commonly known as: FLOMAX Take 0.4 mg by mouth in the morning.   triamcinolone cream 0.5 %  Commonly known as: KENALOG APPLY 1 APPLICATION TOPICALLY 2 (TWO) TIMES DAILY. TO AFFECTED AREAS.        All past medical history, surgical history, allergies, family history, immunizations andmedications were updated in the EMR today and reviewed under the history and medication portions of their EMR.     ROS Negative, with the exception of above mentioned in HPI   Objective:  BP (!) 153/68   Pulse 68   Temp 98.4 F (36.9 C)   Wt 267 lb (121.1 kg)   SpO2 95%   BMI 33.37 kg/m  Body mass index is 33.37 kg/m. Physical Exam Vitals and nursing note reviewed.  Constitutional:      General: He is not in acute distress.    Appearance: Normal appearance. He is not ill-appearing, toxic-appearing or diaphoretic.  HENT:     Head: Normocephalic and atraumatic.  Eyes:     General: No scleral icterus.       Right eye: No discharge.        Left eye: No discharge.     Extraocular Movements: Extraocular movements intact.     Pupils: Pupils are equal, round, and reactive to light.  Cardiovascular:     Rate and Rhythm: Normal rate and regular rhythm.  Pulmonary:     Effort: Pulmonary effort is normal. No respiratory distress.     Breath sounds: Normal breath sounds. No wheezing, rhonchi or rales.  Musculoskeletal:     Right lower leg: No edema.     Left lower leg: No edema.  Skin:    General: Skin is warm.     Findings: No rash.  Neurological:     Mental Status: He is alert and oriented to person, place, and time. Mental status is at baseline.  Psychiatric:        Mood and Affect: Mood normal.        Behavior: Behavior normal.         Thought Content: Thought content normal.        Judgment: Judgment normal.      No results found. No results found. Results for orders placed or performed in visit on 06/29/22 (from the past 24 hour(s))  POCT HgB A1C     Status: Abnormal   Collection Time: 06/29/22  8:35 AM  Result Value Ref Range   Hemoglobin A1C 5.6 4.0 - 5.6 %   HbA1c POC (<> result, manual entry) 5.6 4.0 - 5.6 %   HbA1c, POC (prediabetic range) 5.6 (A) 5.7 - 6.4 %   HbA1c, POC (controlled diabetic range) 5.6 0.0 - 7.0 %    Assessment/Plan: Clayton [name] is a 77 y.o. male present for OV for  Pre-diabetes/obesity Discussed discontinuing metformin is A1c remains below 6 next visit and he continues to lose weight.  Discussed trying swimming for exercise, to help him get the workout he desires without the orthopedic strain on knees.  - POCT HgB A1C 5.6  Primary hypertension/HLD/CHF-diastolic/A.fib s/p ablation) Above goal today x3 Encouraged him to monitor at home and if remains > 130/80 routinely he should call his cardio team to discuss.   Adenocarcinoma, colon (HCC) Continue routine follow up with gastro  Hypercalcemia Reviewed on recent labs from April by Cardio.  - Comp Met (CMET) - PTH, Intact and Calcium - Vitamin D (25 hydroxy)  Reviewed expectations re: course of current medical issues. Discussed self-management of symptoms. Outlined signs and symptoms indicating need for more acute intervention. Patient verbalized understanding and all questions were  answered. Patient received an After-Visit Summary.    Orders Placed This Encounter  Procedures   Comp Met (CMET)   PTH, Intact and Calcium   Vitamin D (25 hydroxy)   POCT HgB A1C   Meds ordered this encounter  Medications   metFORMIN (GLUCOPHAGE) 500 MG tablet    Sig: TAKE 1 TABLET BY MOUTH EVERY DAY WITH BREAKFAST    Dispense:  90 tablet    Refill:  1   Referral Orders  No referral(s) requested today     Note is dictated  utilizing voice recognition software. Although note has been proof read prior to signing, occasional typographical errors still can be missed. If any questions arise, please do not hesitate to call for verification.   electronically signed by:  Felix Pacini, DO  [facility] - OR

## 2022-06-29 NOTE — Patient Instructions (Signed)
Return in about 24 weeks (around 12/14/2022) for Routine chronic condition follow-up.        Great to see you today.  I have refilled the medication(s) we provide.   If labs were collected, we will inform you of lab results once received either by echart message or telephone call.   - echart message- for normal results that have been seen by the patient already.   - telephone call: abnormal results or if patient has not viewed results in their echart.

## 2022-06-30 ENCOUNTER — Encounter: Payer: Self-pay | Admitting: Family Medicine

## 2022-06-30 LAB — COMPREHENSIVE METABOLIC PANEL
AG Ratio: 2 (calc) (ref 1.0–2.5)
ALT: 26 U/L (ref 9–46)
AST: 21 U/L (ref 10–35)
Albumin: 4.5 g/dL (ref 3.6–5.1)
Alkaline phosphatase (APISO): 62 U/L (ref 35–144)
BUN: 22 mg/dL (ref 7–25)
CO2: 20 mmol/L (ref 20–32)
Calcium: 9.9 mg/dL (ref 8.6–10.3)
Chloride: 104 mmol/L (ref 98–110)
Creat: 1.23 mg/dL (ref 0.70–1.28)
Globulin: 2.2 g/dL (calc) (ref 1.9–3.7)
Glucose, Bld: 129 mg/dL — ABNORMAL HIGH (ref 65–99)
Potassium: 4.4 mmol/L (ref 3.5–5.3)
Sodium: 136 mmol/L (ref 135–146)
Total Bilirubin: 1.3 mg/dL — ABNORMAL HIGH (ref 0.2–1.2)
Total Protein: 6.7 g/dL (ref 6.1–8.1)

## 2022-06-30 LAB — PTH, INTACT AND CALCIUM
Calcium: 9.9 mg/dL (ref 8.6–10.3)
PTH: 51 pg/mL (ref 16–77)

## 2022-06-30 LAB — VITAMIN D 25 HYDROXY (VIT D DEFICIENCY, FRACTURES): Vit D, 25-Hydroxy: 35 ng/mL (ref 30–100)

## 2022-07-08 DIAGNOSIS — D225 Melanocytic nevi of trunk: Secondary | ICD-10-CM | POA: Diagnosis not present

## 2022-07-08 DIAGNOSIS — L814 Other melanin hyperpigmentation: Secondary | ICD-10-CM | POA: Diagnosis not present

## 2022-07-08 DIAGNOSIS — L821 Other seborrheic keratosis: Secondary | ICD-10-CM | POA: Diagnosis not present

## 2022-07-08 DIAGNOSIS — Z85828 Personal history of other malignant neoplasm of skin: Secondary | ICD-10-CM | POA: Diagnosis not present

## 2022-07-09 DIAGNOSIS — G4733 Obstructive sleep apnea (adult) (pediatric): Secondary | ICD-10-CM | POA: Diagnosis not present

## 2022-07-13 ENCOUNTER — Ambulatory Visit (INDEPENDENT_AMBULATORY_CARE_PROVIDER_SITE_OTHER): Payer: Medicare PPO | Admitting: Psychology

## 2022-07-13 DIAGNOSIS — F411 Generalized anxiety disorder: Secondary | ICD-10-CM | POA: Diagnosis not present

## 2022-07-13 NOTE — Progress Notes (Signed)
[facility] Counselor/Therapist Progress Note  Patient ID: Clayton [name], MRN: [id number]    Date: 07/13/22  Time Spent: 10:04 am - 10:53 am : 49 Minutes  Treatment Type: Individual Therapy.  Reported Symptoms: anxiety.   Mental Status Exam: Appearance:  Neat and Well Groomed     Behavior: Appropriate  Motor: Normal  Speech/Language:  Clear and Coherent  Affect: Appropriate  Mood: normal  Thought process: normal  Thought content:   WNL  Sensory/Perceptual disturbances:   WNL  Orientation: oriented to person, place, time/date, and situation  Attention: Good  Concentration: Good  Memory: WNL  Fund of knowledge:  Good  Insight:   Good  Judgment:  Good  Impulse Control: Good   Risk Assessment: Danger to Self:  No Self-injurious Behavior: No Danger to Others: No Duty to Warn:no Physical Aggression / Violence:No  Access to Firearms a concern: No  Gang Involvement:No   Subjective:   Rise Patience participated in the session, in person in the office with the therapist, and consented to treatment Kanden reviewed the events of the past week. [name] noted upcoming travel plan and noted excitement regarding this. He noted continuing to wake up anxious and cannot identify the cause of this. He noted being more mindful in the AM and sometimes in the PM but noted barriers include feeling tired at night. He noted wondering if he is having difficulty adjusting to being "old" and "not feeling the way I hope to". He noted often waking with a "dread" feeling when needing to use the bathroom. He noted thinking that he feels "no control". He noted his stomach tightness upon waking. He noted 3-4 nights out of the week, he does not wake up anxious. He noted having a good day, not being stressed, and not being in pain is usually when he gets a good night's sleep. We worked on shifting the self-talk including "this might be the last time you bowl", which could be affecting his  night-time sleep. We discussed working on shifting perspective and being more in the moment. We discussed ways to be more mindful and challenging negative self-talk proactively and managing his symptoms reactively when waking. We worked challenging this via challenging negative thoughts and feelings. Therapist modeled this during the session. Therapist provided psycho-education regarding negative self-talk and the effect on mood. We worked on identifying ways to challenge negative thoughts and feelings, once awake, to reduce distress and return to sleep. We scheduled a following for continued treatment. [name] was engaged and motivated during the session and expressed commitment towards goals. Therapist provided supportive therapy.    Interventions: CBT and Interpersonal  Diagnosis:   Generalized anxiety disorder  Psychiatric Treatment: No , NA  Treatment Plan:  Client Abilities/Strengths Judd is intelligent, forthcoming, and motivated for change.   Support System: Family  Client Treatment Preferences Outpatient Therapy.   Client Statement of Needs Tymon would like to manage his anxiety, specifically morning anxiety, including engaging in more mindfulness, processing past events, manage day to day stress,  process his anxiety (specifically upon waking), process loss of first wife and the effect of this on his anxiety in relation to his current wife, [name] (labeled PTSD by [name]).   Treatment Level Weekly  Symptoms  Anxiety: anxiety, difficulty managing worry, worrying about different things.   (Status: maintained)  Goals:   [name] experiences symptoms of Anxiety.   Treatment plan signed and available on s-drive:  No, pending patient signature.     Target Date:  06/15/23 Frequency: Weekly  Progress: 0 Modality: individual    Therapist will provide referrals for additional resources as appropriate.  Therapist will provide psycho-education regarding Holdan's diagnosis and  corresponding treatment approaches and interventions. Licensed Clinical Social Worker, [place], LCSW will support the patient's ability to achieve the goals identified. will employ CBT, BA, Problem-solving, Solution Focused, Mindfulness,  coping skills, & other evidenced-based practices will be used to promote progress towards healthy functioning to help manage decrease symptoms associated with his diagnosis.   Reduce overall level, frequency, and intensity of the feelings of depression, anxiety  evidenced by decreased overall symptoms from 6 to 7 days/week to 0 to 1 days/week per client report for at least 3 consecutive months. Verbally express understanding of the relationship between feelings of depression, anxiety and their impact on thinking patterns and behaviors. Verbalize an understanding of the role that distorted thinking plays in creating fears, excessive worry, and ruminations.  [name] participated in the creation of the treatment plan)    Delight Ovens, LCSW

## 2022-07-28 NOTE — Progress Notes (Unsigned)
[name] [address] [place] [address] Phone: [phone] Subjective:   INadine Counts, am serving as a scribe for Dr. Antoine Primas.  I'm seeing this patient by the request  of:  Kuneff, Renee A, DO  CC: Neck and back pain follow-up  [id number]  Clayton [name] is a 77 y.o. male coming in with complaint of back and neck pain. OMT on 06/09/2022. Patient states hernia check? No pain.  Medications patient has been prescribed: Kenalog cream  Taking:         Reviewed prior external information including notes and imaging from previsou exam, outside providers and external EMR if available.   As well as notes that were available from care everywhere and other healthcare systems.  Past medical history, social, surgical and family history all reviewed in electronic medical record.  No pertanent information unless stated regarding to the chief complaint.   Past Medical History:  Diagnosis Date   Aortic atherosclerosis (HCC) 12/21/2021   Arthritis    hips    Atrial fibrillation (HCC)    Cataract    Chronic diastolic heart failure (HCC) 12/21/2021   Cochlear Meniere syndrome of left ear 06/09/2015   CPAP (continuous positive airway pressure) dependence    Dysrhythmia    PAF- hx of 7 years ago    HLD (hyperlipidemia)    Hypertension 05/29/2010   ECHO-EF 67% wnl   Meniere disease    Obesity    OSA on CPAP 08/20/2012   Palpitations    S/P right total hip arthroplasty 08/12/2020   Sleep apnea 09/14/2004   [facility]- Dr. Welton Flakes; CPAP titration 11/12/04- Dr. Welton Flakes   Squamous cell carcinoma of skin of neck    right; 2023    No Known Allergies   Review of Systems:  No headache, visual changes, nausea, vomiting, diarrhea, constipation, dizziness, abdominal pain, skin rash, fevers, chills, night sweats, weight loss, swollen lymph nodes, body aches, joint swelling, chest pain, shortness of breath, mood changes. POSITIVE  muscle aches  Objective  Blood pressure 138/84, pulse 92, height 6\' 3"  (1.905 m), weight 270 lb (122.5 kg), SpO2 95%.   General: No apparent distress alert and oriented x3 mood and affect normal, dressed appropriately.  HEENT: Pupils equal, extraocular movements intact  Respiratory: Patient's speak in full sentences and does not appear short of breath  Cardiovascular: No lower extremity edema, non tender, no erythema  Low back does have significant tightness noted.  Tenderness to palpation over the paraspinal musculature of the lumbar spine.  Tightness noted with FABER test. Antalgic gait noted.  Osteopathic findings  T9 extended rotated and side bent left L2 flexed rotated and side bent right Sacrum right on right       Assessment and Plan:  Arthritis of right hip Patient does have a hip and instability noted.  Will get a bone scan to further evaluate.  Did have the replacement.  Follow-up with me again in 6 to 8 weeks otherwise.  Degenerative disc disease, lumbar Degenerative arthritis that I think has actually had some more exacerbation recently.  Discussed icing regimen and home exercises.  Increase activity slowly.  Follow-up again in 6 to 8 weeks.    Nonallopathic problems  Decision today to treat with OMT was based on Physical Exam  After verbal consent patient was treated with HVLA, ME, FPR techniques in  thoracic, lumbar, and sacral  areas  Patient tolerated the procedure well with improvement in symptoms  Patient given exercises, stretches and lifestyle modifications  See medications in patient instructions if given  Patient will follow up in 4-8 weeks     The above documentation has been reviewed and is accurate and complete Judi Saa, DO         Note: This dictation was prepared with Dragon dictation along with smaller phrase technology. Any transcriptional errors that result from this process are unintentional.

## 2022-07-29 ENCOUNTER — Encounter: Payer: Self-pay | Admitting: Family Medicine

## 2022-07-29 ENCOUNTER — Ambulatory Visit: Payer: Medicare PPO | Admitting: Family Medicine

## 2022-07-29 VITALS — BP 138/84 | HR 92 | Ht 75.0 in | Wt 270.0 lb

## 2022-07-29 DIAGNOSIS — M9903 Segmental and somatic dysfunction of lumbar region: Secondary | ICD-10-CM | POA: Diagnosis not present

## 2022-07-29 DIAGNOSIS — M5136 Other intervertebral disc degeneration, lumbar region: Secondary | ICD-10-CM

## 2022-07-29 DIAGNOSIS — M1611 Unilateral primary osteoarthritis, right hip: Secondary | ICD-10-CM

## 2022-07-29 DIAGNOSIS — M9902 Segmental and somatic dysfunction of thoracic region: Secondary | ICD-10-CM | POA: Diagnosis not present

## 2022-07-29 DIAGNOSIS — M9904 Segmental and somatic dysfunction of sacral region: Secondary | ICD-10-CM

## 2022-07-29 NOTE — Assessment & Plan Note (Signed)
Degenerative arthritis that I think has actually had some more exacerbation recently.  Discussed icing regimen and home exercises.  Increase activity slowly.  Follow-up again in 6 to 8 weeks.

## 2022-07-29 NOTE — Patient Instructions (Addendum)
Good to see you! Schedule Bone Scan R hip See you again in 6 weeks

## 2022-07-29 NOTE — Assessment & Plan Note (Signed)
Patient does have a hip and instability noted.  Will get a bone scan to further evaluate.  Did have the replacement.  Follow-up with me again in 6 to 8 weeks otherwise.

## 2022-08-02 ENCOUNTER — Telehealth: Payer: Self-pay

## 2022-08-02 ENCOUNTER — Other Ambulatory Visit: Payer: Self-pay

## 2022-08-02 DIAGNOSIS — Z96641 Presence of right artificial hip joint: Secondary | ICD-10-CM

## 2022-08-02 NOTE — Telephone Encounter (Signed)
I did not order a dexa for this patient.  Why are they requesting an order? I did not order nay xray to generate this order.  Possibly ordered by speciality team

## 2022-08-02 NOTE — Telephone Encounter (Signed)
Order was needed for a Bone Scan per Dr Katrinka Blazing, not DEXA.  Correct order has been placed. Informed patient and canceled dexa appointment.

## 2022-08-02 NOTE — Telephone Encounter (Signed)
[facility] called requesting order for DEXA scan. No order in EPIC.  Patient is scheduled tomorrow 7/16 at 1:30pm.

## 2022-08-03 ENCOUNTER — Inpatient Hospital Stay: Admission: RE | Admit: 2022-08-03 | Payer: Medicare PPO | Source: Ambulatory Visit

## 2022-08-04 ENCOUNTER — Encounter: Payer: Self-pay | Admitting: Physician Assistant

## 2022-08-04 ENCOUNTER — Ambulatory Visit: Payer: Medicare PPO | Attending: Physician Assistant | Admitting: Physician Assistant

## 2022-08-04 VITALS — BP 136/88 | HR 90 | Ht 75.0 in | Wt 273.0 lb

## 2022-08-04 DIAGNOSIS — E782 Mixed hyperlipidemia: Secondary | ICD-10-CM | POA: Diagnosis not present

## 2022-08-04 DIAGNOSIS — I10 Essential (primary) hypertension: Secondary | ICD-10-CM

## 2022-08-04 DIAGNOSIS — I480 Paroxysmal atrial fibrillation: Secondary | ICD-10-CM | POA: Diagnosis not present

## 2022-08-04 NOTE — Progress Notes (Signed)
Cardiology Office Note:  .   Date:  08/04/2022  ID:  Clayton [name], DOB 1945/03/01, MRN [id number] PCP: Natalia Leatherwood, DO  [facility] Providers Cardiologist:  Nicki Guadalajara, MD Electrophysiologist:  Will Jorja Loa, MD  Sleep Medicine:  Nicki Guadalajara, MD     History of Present Illness: .   Clayton [name] is a 77 y.o. male with past medical history of remote PAF s/p cardioversion 2007, OSA on CPAP, hypertension, hyperlipidemia and Mnire's disease.  There was some concern that his Mnire's disease was related to statin use, this led to its discontinuation.  He had diarrhea on Livalo.  He had recurrence of atrial fibrillation in July 2020.  Echocardiogram obtained on 08/16/2018 showed EF 50 to 55%, significant LVH, moderate dilatation of the left atrium.  Rate control therapy was increased.  He ultimately underwent successful cardioversion with restoration of sinus rhythm.  Unfortunately by December 2020, he was already back in atrial fibrillation.  He was started on flecainide, however developed fatigue.  He was seen by Dr. Elberta Fortis in January 2021 for persistent atrial fibrillation.  He did develop an transient episode of word finding difficulty, this was evaluated by Dr. Pearlean Brownie and felt to be representative of mild cognitive impairment rather than TIA.  He eventually underwent successful cardioversion on flecainide.  He had fatigue and dyspnea on higher dose of flecainide, therefore the dosage was reduced.  Myoview obtained for dyspnea on exertion in June 2021 showed EF 47%, large defect of severe severity present in the basal inferior, mid inferior and apical inferior location consistent with previous MI with peri-infarct ischemia, overall low risk study.  This was reviewed by Dr. Tresa Endo, given lack of any anginal symptom, no ischemic workup was pursued.  He had another recurrent atrial fibrillation in 2022, flecainide discontinued and amiodarone started.  He was scheduled to undergo DC  cardioversion in June 2022, however found to be back in sinus rhythm on arrival, therefore procedure was canceled.  He eventually underwent atrial fibrillation ablation by Dr. Elberta Fortis on 09/02/2020.  Cardiac CT image obtained around the time showed coronary calcium score of 42.7 which places the patient at the 24th percentile for age and sex matched control, ascending aorta near the upper border normal at 3.9 cm.  Last echocardiogram obtained in July 2023 showed EF 50 to 55%, moderate LAE, dilated aortic root at 4.3 cm, dilated aortic arch at 4.5 cm.  He underwent colonoscopy which found a small mass, biopsy positive for malignancy.  CT imaging showed no metastatic disease. He underwent resection of the colonic mass by Dr. Marin Olp in December 2023.  He also underwent biopsy and resection of the skin lesion on his neck which turned out to be squamous cell carcinoma.  He was most recently seen by Dr. Tresa Endo in May 2024 at which time he was doing well.  His blood pressure was elevated, metoprolol was increased to 100 mg in a.m. and 50 mg in p.m., it was recommended for him to have a earlier follow-up.  If blood pressure remains elevated, may need to reinitiate low-dose olmesartan.  Patient presents today for follow-up.  Initial blood pressure was 138/88.  Manual repeat by myself was 144/78.  Even though blood pressure is elevated in the office, he says his blood pressure has been normal at home.  He says he has average systolic blood pressure at home based on his [employer] blood pressure machine was 125.  He mentioned he was scared by his  blood pressure machine last night because the blood pressure machine picked up irregular heartbeat.  He was concerned that his A-fib might have came back.  That is why he did not sleep much last night.  When listen to his heart, his heart is very regular, suggestive of sinus rhythm.  Given normalization of the blood pressure, I did not recommend any blood pressure medication  adjustment.  He can follow-up with Dr. Tresa Endo as previously scheduled in October and Dr. Elberta Fortis as previously scheduled in September.  ROS:   He denies chest pain, palpitations, dyspnea, pnd, orthopnea, n, v, dizziness, syncope, edema, weight gain, or early satiety. All other systems reviewed and are otherwise negative except as noted above.    Studies Reviewed: .        Cardiac Studies & Procedures     STRESS TESTS  MYOCARDIAL PERFUSION IMAGING 07/13/2019  Narrative  The left ventricular ejection fraction is mildly decreased (45-54%).  Nuclear stress EF: 47%.  Blood pressure demonstrated a hypertensive response to exercise.  There was no ST segment deviation noted during stress.  Defect 1: There is a large defect of severe severity present in the basal inferior, mid inferior and apical inferior location.  Findings consistent with prior myocardial infarction with peri-infarct ischemia.  This is a low risk study.  Abnormal, low risk stress nuclear study with prior inferior infarct and mild peri-infarct ischemia.  Gated ejection fraction 47% with hypokinesis of the inferior basal wall.  Mild left ventricular enlargement.   ECHOCARDIOGRAM  ECHOCARDIOGRAM COMPLETE 07/24/2021  Narrative ECHOCARDIOGRAM REPORT    Patient Name:   Clayton [name] Date of Exam: 07/24/2021 Medical Rec #:  [id number]        Height:       75.0 in Accession #:    [id number]       Weight:       254.8 lb Date of Birth:  Dec 12, 1945        BSA:          2.432 m Patient Age:    75 years         BP:           140/90 mmHg Patient Gender: M                HR:           73 bpm. Exam Location:  Outpatient  Procedure: 2D Echo, Cardiac Doppler, Color Doppler and Strain Analysis  Indications:    R53.83 Fatigue  History:        Patient has prior history of Echocardiogram examinations, most recent 02/01/2019. Ablation, Arrythmias:Atrial Fibrillation, Signs/Symptoms:Fatigue; Risk Factors:Hypertension, Sleep  Apnea, Dyslipidemia and Non-Smoker. Patient denies chest pain, SOB and leg edema. His only complaint is fatigue.  Sonographer:    Carlos American RVT, RDCS (AE), RDMS Referring Phys: [phone] Mariam Dollar [facility]  IMPRESSIONS   1. Left ventricular ejection fraction, by estimation, is 50 to 55%. Left ventricular ejection fraction by 2D MOD biplane is 54.2 %. The left ventricle has low normal function. The left ventricle has no regional wall motion abnormalities. There is mild left ventricular hypertrophy. Left ventricular diastolic parameters are consistent with Grade I diastolic dysfunction (impaired relaxation). 2. Reduced RV free wall strain at -19.4%. Right ventricular systolic function is normal. The right ventricular size is normal. There is normal pulmonary artery systolic pressure. The estimated right ventricular systolic pressure is 16.4 mmHg. 3. Left atrial size was moderately dilated. 4. The mitral valve is abnormal.  Trivial mitral valve regurgitation. 5. The aortic valve is tricuspid. Aortic valve regurgitation is not visualized. 6. Aortic dilatation noted. There is mild dilatation of the aortic root, measuring 43 mm. There is mild dilatation of the ascending aorta, measuring 42 mm. There is moderate dilatation of the aortic arch, measuring 45 mm. 7. The inferior vena cava is normal in size with greater than 50% respiratory variability, suggesting right atrial pressure of 3 mmHg.  Comparison(s): Changes from prior study are noted. 02/01/2019: LVEF 50%, moderate LVH, asc aor 39mm.  Conclusion(s)/Recommendation(s): Recommend contrast CT thoracic aorta to evaluate extent of aneurysm if not already know or obtained.  FINDINGS Left Ventricle: Left ventricular ejection fraction, by estimation, is 50 to 55%. Left ventricular ejection fraction by 2D MOD biplane is 54.2 %. The left ventricle has low normal function. The left ventricle has no regional wall motion abnormalities. The left  ventricular internal cavity size was normal in size. There is mild left ventricular hypertrophy. Left ventricular diastolic parameters are consistent with Grade I diastolic dysfunction (impaired relaxation). Indeterminate filling pressures.  Right Ventricle: Reduced RV free wall strain at -19.4%. The right ventricular size is normal. No increase in right ventricular wall thickness. Right ventricular systolic function is normal. There is normal pulmonary artery systolic pressure. The tricuspid regurgitant velocity is 1.83 m/s, and with an assumed right atrial pressure of 3 mmHg, the estimated right ventricular systolic pressure is 16.4 mmHg.  Left Atrium: Left atrial size was moderately dilated.  Right Atrium: Right atrial size was normal in size.  Pericardium: There is no evidence of pericardial effusion.  Mitral Valve: The mitral valve is abnormal. Mild mitral annular calcification. Trivial mitral valve regurgitation.  Tricuspid Valve: The tricuspid valve is grossly normal. Tricuspid valve regurgitation is trivial.  Aortic Valve: The aortic valve is tricuspid. Aortic valve regurgitation is not visualized. Aortic valve mean gradient measures 5.0 mmHg. Aortic valve peak gradient measures 9.0 mmHg. Aortic valve area, by VTI measures 3.81 cm.  Pulmonic Valve: The pulmonic valve was normal in structure. Pulmonic valve regurgitation is not visualized.  Aorta: Aortic dilatation noted. There is mild dilatation of the aortic root, measuring 43 mm. There is mild dilatation of the ascending aorta, measuring 42 mm. There is moderate dilatation of the aortic arch, measuring 45 mm.  Venous: The inferior vena cava is normal in size with greater than 50% respiratory variability, suggesting right atrial pressure of 3 mmHg.  IAS/Shunts: No atrial level shunt detected by color flow Doppler.   LEFT VENTRICLE PLAX 2D                        Biplane EF (MOD) LVIDd:         5.35 cm         LV Biplane EF:    Left LVIDs:         3.91 cm                          ventricular LV PW:         1.32 cm                          ejection LV IVS:        1.14 cm                          fraction by LVOT diam:  2.40 cm                          2D MOD LV SV:         108                              biplane is LV SV Index:   44                               54.2 %. LVOT Area:     4.52 cm Diastology LV e' medial:    4.12 cm/s LV Volumes (MOD)               LV E/e' medial:  20.4 LV vol d, MOD    80.0 ml       LV e' lateral:   10.90 cm/s A2C:                           LV E/e' lateral: 7.7 LV vol d, MOD    121.0 ml A4C: LV vol s, MOD    47.0 ml A2C: LV vol s, MOD    47.5 ml A4C: LV SV MOD A2C:   33.0 ml LV SV MOD A4C:   121.0 ml LV SV MOD BP:    56.1 ml  RIGHT VENTRICLE RV S prime:     11.65 cm/s TAPSE (M-mode): 1.8 cm  LEFT ATRIUM              Index        RIGHT ATRIUM           Index LA diam:        4.40 cm  1.81 cm/m   RA Area:     13.20 cm LA Vol (A2C):   137.0 ml 56.33 ml/m  RA Volume:   26.20 ml  10.77 ml/m LA Vol (A4C):   104.0 ml 42.76 ml/m LA Biplane Vol: 123.0 ml 50.57 ml/m AORTIC VALVE                     PULMONIC VALVE AV Area (Vmax):    3.74 cm      PV Vmax:       1.04 m/s AV Area (Vmean):   3.51 cm      PV Peak grad:  4.4 mmHg AV Area (VTI):     3.81 cm AV Vmax:           150.00 cm/s AV Vmean:          101.000 cm/s AV VTI:            0.284 m AV Peak Grad:      9.0 mmHg AV Mean Grad:      5.0 mmHg LVOT Vmax:         124.00 cm/s LVOT Vmean:        78.300 cm/s LVOT VTI:          0.239 m LVOT/AV VTI ratio: 0.84  AORTA Ao Root diam: 4.30 cm Ao Asc diam:  4.20 cm Ao Arch diam: 4.5 cm  MITRAL VALVE               TRICUSPID VALVE MV Area (PHT): 3.58 cm    TR Peak grad:   13.4 mmHg MV Decel Time: 212  msec    TR Vmax:        183.00 cm/s MV E velocity: 84.10 cm/s MV A velocity: 92.90 cm/s  SHUNTS MV E/A ratio:  0.91        Systemic VTI:  0.24 m Systemic Diam: 2.40  cm  Zoila Shutter MD Electronically signed by Zoila Shutter MD Signature Date/Time: 07/24/2021/11:50:45 AM    Final    MONITORS  LONG TERM MONITOR (3-14 DAYS) 07/31/2021  Narrative Patch Wear Time:  11 days and 9 hours  Predominant rhythm was sinus rhythm 20 SVT runs all less than 12 beats Triggered episodes associated with sinus rhythm and short runs of SVT  Will Camnitz, MD           Risk Assessment/Calculations:    CHA2DS2-VASc Score = 3   This indicates a 3.2% annual risk of stroke. The patient's score is based upon: CHF History: 0 HTN History: 1 Diabetes History: 0 Stroke History: 0 Vascular Disease History: 0 Age Score: 2 Gender Score: 0            Physical Exam:   VS:  BP 136/88 (BP Location: Left Arm, Patient Position: Sitting, Cuff Size: Normal)   Pulse 90   Ht 6\' 3"  (1.905 m)   Wt 273 lb (123.8 kg)   SpO2 95%   BMI 34.12 kg/m    Wt Readings from Last 3 Encounters:  08/04/22 273 lb (123.8 kg)  07/29/22 270 lb (122.5 kg)  06/29/22 267 lb (121.1 kg)    GEN: Well nourished, well developed in no acute distress NECK: No JVD; No carotid bruits CARDIAC: RRR, no murmurs, rubs, gallops RESPIRATORY:  Clear to auscultation without rales, wheezing or rhonchi  ABDOMEN: Soft, non-tender, non-distended EXTREMITIES:  No edema; No deformity   ASSESSMENT AND PLAN: .    Paroxysmal atrial fibrillation: Currently maintaining sinus rhythm.  On Eliquis and metoprolol succinate  Hypertension: Blood pressure elevated in the office today, however has been normal at home with average blood pressure around 125 mmHg  Hyperlipidemia: On pitavastatin       Dispo: Follow-up with Dr. Tresa Endo and Dr. Elberta Fortis as previously scheduled.  Signed, Azalee Course, PA

## 2022-08-04 NOTE — Patient Instructions (Signed)
 Medication Instructions:  No Changes *If you need a refill on your cardiac medications before your next appointment, please call your pharmacy*   Lab Work: No Labs If you have labs (blood work) drawn today and your tests are completely normal, you will receive your results only by: [place] (if you have MyChart) OR A paper copy in the mail If you have any lab test that is abnormal or we need to change your treatment, we will call you to review the results.   Testing/Procedures: No Testing   Follow-Up: At [facility], you and your health needs are our priority.  As part of our continuing mission to provide you with exceptional heart care, we have created designated Provider Care Teams.  These Care Teams include your primary Cardiologist (physician) and Advanced Practice Providers (APPs -  Physician Assistants and Nurse Practitioners) who all work together to provide you with the care you need, when you need it.  We recommend signing up for the patient portal called "MyChart".  Sign up information is provided on this After Visit Summary.  MyChart is used to connect with patients for Virtual Visits (Telemedicine).  Patients are able to view lab/test results, encounter notes, upcoming appointments, etc.  Non-urgent messages can be sent to your provider as well.   To learn more about what you can do with MyChart, go to [email or web address].    Your next appointment:   Keep Scheduled Appointment  Provider:   Shelva Majestic, MD

## 2022-08-06 ENCOUNTER — Encounter (HOSPITAL_COMMUNITY): Admission: RE | Admit: 2022-08-06 | Payer: Medicare PPO | Source: Ambulatory Visit

## 2022-08-06 ENCOUNTER — Encounter (HOSPITAL_COMMUNITY): Payer: Medicare PPO

## 2022-08-16 ENCOUNTER — Encounter (HOSPITAL_COMMUNITY)
Admission: RE | Admit: 2022-08-16 | Discharge: 2022-08-16 | Disposition: A | Discharge status: Home / Self Care | Payer: Medicare PPO | Source: Ambulatory Visit | Attending: Family Medicine | Admitting: Family Medicine

## 2022-08-16 DIAGNOSIS — Z96643 Presence of artificial hip joint, bilateral: Secondary | ICD-10-CM | POA: Diagnosis not present

## 2022-08-16 DIAGNOSIS — M4696 Unspecified inflammatory spondylopathy, lumbar region: Secondary | ICD-10-CM | POA: Diagnosis not present

## 2022-08-16 DIAGNOSIS — Z96641 Presence of right artificial hip joint: Secondary | ICD-10-CM

## 2022-08-16 DIAGNOSIS — M25551 Pain in right hip: Secondary | ICD-10-CM | POA: Diagnosis not present

## 2022-08-16 MED ORDER — TECHNETIUM TC 99M MEDRONATE IV KIT
22.0000 | PACK | Freq: Once | INTRAVENOUS | Status: AC | PRN
  Administered 2022-08-16: 22 via INTRAVENOUS

## 2022-08-17 ENCOUNTER — Ambulatory Visit (INDEPENDENT_AMBULATORY_CARE_PROVIDER_SITE_OTHER): Payer: Medicare PPO | Admitting: Psychology

## 2022-08-17 DIAGNOSIS — F411 Generalized anxiety disorder: Secondary | ICD-10-CM

## 2022-08-17 NOTE — Progress Notes (Signed)
[facility] Counselor/Therapist Progress Note  Patient ID: Clayton [name], MRN: [id number]    Date: 08/17/22  Time Spent: 9:02 am - 9:53 am : 51 Minutes  Treatment Type: Individual Therapy.  Reported Symptoms: anxiety.   Mental Status Exam: Appearance:  Neat and Well Groomed     Behavior: Appropriate  Motor: Normal  Speech/Language:  Clear and Coherent  Affect: Appropriate  Mood: normal  Thought process: normal  Thought content:   WNL  Sensory/Perceptual disturbances:   WNL  Orientation: oriented to person, place, time/date, and situation  Attention: Good  Concentration: Good  Memory: WNL  Fund of knowledge:  Good  Insight:   Good  Judgment:  Good  Impulse Control: Good   Risk Assessment: Danger to Self:  No Self-injurious Behavior: No Danger to Others: No Duty to Warn:no Physical Aggression / Violence:No  Access to Firearms a concern: No  Gang Involvement:No   Subjective:   Clayton [name] participated in the session, in person in the office with the therapist, and consented to treatment Clayton [name] reviewed the events of the past week. Clayton [name] noted making some progress in being more mindful but noted that this has not been integrated in his daily routine. He noted have a history of hypertension and noted having "white coat syndrome". He noted worry that his Afib might return, which causes him anxiety. He noted his Afib developing with the loss of his first wife and a reoccurrence when his second wife was ill. He noted recently taking his BP, at home, in preparation of his cardiological appointment and noted the device alerting him of some issue but noted that the alert not being specific. He noted with some mindfulness activities and exercise, his BP dropped. He noted having a recent scan and endorsed white coat syndrome, as well. We worked on identifying ways to engage in mindfulness more consistently. Clayton [name] noted a need to be more disciplined with this. His  related goals include exercise, losing weight, and solidifying healthful routines. We worked on identifying a system to remind him of tasks. He discussed tools such as written reminders, phone reminders, or a smart speaker to announce calendar. We discussed ways to check-in with goals. He noted that morning, when his BP is up, is "the one time of the day that I don't feel in control" and highlighted this as ""Boogie Man at night". We will explore this going forward during follow-up sessions. Clayton [name] was engaged and motivated and expressed commitment towards session goals. Therapist praised Clayton [name] and a follow-up was scheduled for continued treatment.    Interventions: CBT   Diagnosis:   Generalized anxiety disorder  Psychiatric Treatment: No , NA  Treatment Plan:  Client Abilities/Strengths Clayton [name] is intelligent, forthcoming, and motivated for change.   Support System: Family  Client Treatment Preferences Outpatient Therapy.   Client Statement of Needs Clayton [name] would like to manage his anxiety, specifically morning anxiety, including engaging in more mindfulness, processing past events, manage day to day stress,  process his anxiety (specifically upon waking), process loss of first wife and the effect of this on his anxiety in relation to his current wife, Clayton [name] (labeled PTSD by Clayton [name]).   Treatment Level Weekly  Symptoms  Anxiety: anxiety, difficulty managing worry, worrying about different things.   (Status: maintained)  Goals:   Clayton [name] experiences symptoms of Anxiety.   Treatment plan signed and available on s-drive:  No, pending patient signature.     Target Date: 06/15/23 Frequency: Weekly  Progress: 0 Modality:  individual    Therapist will provide referrals for additional resources as appropriate.  Therapist will provide psycho-education regarding Clayton [name]'s diagnosis and corresponding treatment approaches and interventions. Licensed Clinical Social Worker, [place], LCSW  will support the patient's ability to achieve the goals identified. will employ CBT, BA, Problem-solving, Solution Focused, Mindfulness,  coping skills, & other evidenced-based practices will be used to promote progress towards healthy functioning to help manage decrease symptoms associated with his diagnosis.   Reduce overall level, frequency, and intensity of the feelings of depression, anxiety  evidenced by decreased overall symptoms from 6 to 7 days/week to 0 to 1 days/week per client report for at least 3 consecutive months. Verbally express understanding of the relationship between feelings of depression, anxiety and their impact on thinking patterns and behaviors. Verbalize an understanding of the role that distorted thinking plays in creating fears, excessive worry, and ruminations.  Clayton [name] participated in the creation of the treatment plan)    Delight Ovens, LCSW

## 2022-08-23 ENCOUNTER — Encounter: Payer: Self-pay | Admitting: Family Medicine

## 2022-09-05 ENCOUNTER — Other Ambulatory Visit: Payer: Self-pay | Admitting: Cardiology

## 2022-09-07 NOTE — Progress Notes (Unsigned)
[name] [address] [place] [address] Phone: [phone] Subjective:   Clayton [name], am serving as a scribe for Dr. Antoine Primas.  I'm seeing this patient by the request  of:  Kuneff, Renee A, DO  CC: Right knee pain follow-up  GMW:[id number]  Clayton [name] is a 77 y.o. male coming in with complaint of back and neck pain. OMT on 07/29/2022. Patient states that his L knee is doing well after having visco injection. Right knee is painful intermittently. Wants to know workout that he can do to lose weight with his knee pain.            Reviewed prior external information including notes and imaging from previsou exam, outside providers and external EMR if available.   As well as notes that were available from care everywhere and other healthcare systems.  Past medical history, social, surgical and family history all reviewed in electronic medical record.  No pertanent information unless stated regarding to the chief complaint.   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 Welton Flakes; CPAP titration 11/12/04- Dr. Welton Flakes   Squamous cell carcinoma of skin of neck    right; 2023    No Known Allergies   Review of Systems:  No headache, visual changes, nausea, vomiting, diarrhea, constipation, dizziness, abdominal pain, skin rash, fevers, chills, night sweats, weight loss, swollen lymph nodes,  body aches, joint swelling, chest pain, shortness of breath, mood changes. POSITIVE muscle aches  Objective  Blood pressure (!) 142/92, pulse 75, height 6\' 3"  (1.905 m), SpO2 98%.   General: No apparent distress alert and oriented x3 mood and affect normal, dressed appropriately.  HEENT: Pupils equal, extraocular movements intact  Respiratory: Patient's speak in full sentences and does not appear short of breath  Cardiovascular: No lower extremity edema, non tender, no erythema  Low back does have some loss lordosis noted.  Some tenderness to palpation in the paraspinal musculature.  Tightness noted with some mild loss of lordosis.  Patient has gained some weight recently.  Osteopathic findings  C5 flexed rotated and side bent left T3 extended rotated and side bent right inhaled rib T5 extended rotated and side bent left L5 flexed rotated and side bent left  Sacrum right on right       Assessment and Plan:  Degenerative disc disease, lumbar Degenerative disc disease of the lumbar spine.  Does have some mild loss of lordosis.  Nothing no severe enough.  Encouraged him to continue to work on the weight loss.  I do think it would be significantly beneficial.  Discussed avoiding certain activities.  Follow-up with me again in 6 to 8 weeks otherwise to further evaluate.    Nonallopathic problems  Decision today to treat with OMT was based on Physical Exam  After verbal  consent patient was treated with HVLA, ME, FPR techniques in cervical, rib, thoracic, lumbar, and sacral  areas  Patient tolerated the procedure well with improvement in symptoms  Patient given exercises, stretches and lifestyle modifications  See medications in patient instructions if given  Patient will follow up in 4-8 weeks     The above documentation has been reviewed and is accurate and complete Judi Saa, DO         Note: This dictation was prepared with Dragon dictation along with smaller  phrase technology. Any transcriptional errors that result from this process are unintentional.

## 2022-09-09 ENCOUNTER — Ambulatory Visit: Payer: Medicare PPO | Admitting: Family Medicine

## 2022-09-09 ENCOUNTER — Encounter: Payer: Self-pay | Admitting: Family Medicine

## 2022-09-09 VITALS — BP 142/92 | HR 75 | Ht 75.0 in

## 2022-09-09 DIAGNOSIS — M9908 Segmental and somatic dysfunction of rib cage: Secondary | ICD-10-CM | POA: Diagnosis not present

## 2022-09-09 DIAGNOSIS — M9902 Segmental and somatic dysfunction of thoracic region: Secondary | ICD-10-CM | POA: Diagnosis not present

## 2022-09-09 DIAGNOSIS — M9904 Segmental and somatic dysfunction of sacral region: Secondary | ICD-10-CM | POA: Diagnosis not present

## 2022-09-09 DIAGNOSIS — M5136 Other intervertebral disc degeneration, lumbar region: Secondary | ICD-10-CM

## 2022-09-09 DIAGNOSIS — M9901 Segmental and somatic dysfunction of cervical region: Secondary | ICD-10-CM

## 2022-09-09 DIAGNOSIS — M9903 Segmental and somatic dysfunction of lumbar region: Secondary | ICD-10-CM

## 2022-09-09 NOTE — Assessment & Plan Note (Signed)
Degenerative disc disease of the lumbar spine.  Does have some mild loss of lordosis.  Nothing no severe enough.  Encouraged him to continue to work on the weight loss.  I do think it would be significantly beneficial.  Discussed avoiding certain activities.  Follow-up with me again in 6 to 8 weeks otherwise to further evaluate.

## 2022-09-09 NOTE — Patient Instructions (Signed)
Great to see you Think of swimming See me in 6-8 weeks

## 2022-09-14 ENCOUNTER — Other Ambulatory Visit: Payer: Self-pay | Admitting: Cardiovascular Disease

## 2022-09-14 ENCOUNTER — Ambulatory Visit (INDEPENDENT_AMBULATORY_CARE_PROVIDER_SITE_OTHER): Payer: Medicare PPO | Admitting: Psychology

## 2022-09-14 DIAGNOSIS — F411 Generalized anxiety disorder: Secondary | ICD-10-CM

## 2022-09-14 DIAGNOSIS — I4819 Other persistent atrial fibrillation: Secondary | ICD-10-CM

## 2022-09-14 NOTE — Addendum Note (Signed)
Encounter addended by: Larina Earthly, RT on: 09/14/2022 8:22 AM  Actions taken: Imaging Exam ended, Charge Capture section accepted

## 2022-09-14 NOTE — Progress Notes (Signed)
[facility] Counselor/Therapist Progress Note  Patient ID: Clayton [name], MRN: [id number]    Date: 09/14/22  Time Spent: 12:04 pm - 12:50 pm : 46 Minutes  Treatment Type: Individual Therapy.  Reported Symptoms: anxiety.   Mental Status Exam: Appearance:  Neat and Well Groomed     Behavior: Appropriate  Motor: Normal  Speech/Language:  Clear and Coherent  Affect: Appropriate  Mood: normal  Thought process: normal  Thought content:   WNL  Sensory/Perceptual disturbances:   WNL  Orientation: oriented to person, place, time/date, and situation  Attention: Good  Concentration: Good  Memory: WNL  Fund of knowledge:  Good  Insight:   Good  Judgment:  Good  Impulse Control: Good   Risk Assessment: Danger to Self:  No Self-injurious Behavior: No Danger to Others: No Duty to Warn:no Physical Aggression / Violence:No  Access to Firearms a concern: No  Gang Involvement:No   Subjective:   Clayton [name] participated in the session, in person in the office with the therapist, and consented to treatment Clayton [name] reviewed the events of the past week. Clayton [name] noted making some progress in his morning-time anxiety and noted the improvement being due to his mindfulness and relaxation exercises he is doing on a nightly basis. He noted the intensity and duration of his anxiety, in the morning, has reduced. He noted having an upcoming cardiological appointment and noted continuing to experience white coat syndrome. He noted his intent to measure his BP a week prior to his visit and to assess this in addition to his level of anxiety, as well. He noted things generally going quite well and noted his focus on maintaining his health. He did note feeling some aches and pains along with some physical limitations. He noted his efforts to be mindful and taking regular breaks. He noted his interest and effort to lose weight and noted having being successful, in the past, in weight-loss. We  discussed the goal of reviewing his diet and putting this into place and monitoring progress while looking for trends and not minor changes day to day. Clayton [name] was engaged and motivated during the session and expressed his commitment towards our goals. Therapist praised Clayton [name] for his effort and provided supportive therapy. A follow-up was scheduled for continued treatment.    Interventions: CBT   Diagnosis:   Generalized anxiety disorder  Psychiatric Treatment: No , NA  Treatment Plan:  Client Abilities/Strengths Clayton [name] is intelligent, forthcoming, and motivated for change.   Support System: Family  Client Treatment Preferences Outpatient Therapy.   Client Statement of Needs Clayton [name] would like to manage his anxiety, specifically morning anxiety, including engaging in more mindfulness, processing past events, manage day to day stress,  process his anxiety (specifically upon waking), process loss of first wife and the effect of this on his anxiety in relation to his current wife, Clayton [name] (labeled PTSD by Clayton [name]).   Treatment Level Weekly  Symptoms  Anxiety: anxiety, difficulty managing worry, worrying about different things.   (Status: maintained)  Goals:   Clayton [name] experiences symptoms of Anxiety.   Treatment plan signed and available on s-drive:  No, pending patient signature.     Target Date: 06/15/23 Frequency: Weekly  Progress: 0 Modality: individual    Therapist will provide referrals for additional resources as appropriate.  Therapist will provide psycho-education regarding Abundio's diagnosis and corresponding treatment approaches and interventions. Licensed Clinical Social Worker, [place], LCSW will support the patient's ability to achieve the goals identified. will employ CBT, BA,  Problem-solving, Solution Focused, Mindfulness,  coping skills, & other evidenced-based practices will be used to promote progress towards healthy functioning to help manage decrease symptoms  associated with his diagnosis.   Reduce overall level, frequency, and intensity of the feelings of depression, anxiety  evidenced by decreased overall symptoms from 6 to 7 days/week to 0 to 1 days/week per client report for at least 3 consecutive months. Verbally express understanding of the relationship between feelings of depression, anxiety and their impact on thinking patterns and behaviors. Verbalize an understanding of the role that distorted thinking plays in creating fears, excessive worry, and ruminations.  Clayton [name] participated in the creation of the treatment plan)    Delight Ovens, LCSW

## 2022-09-15 NOTE — Telephone Encounter (Signed)
Prescription refill request for Eliquis received. Indication: Afib  Last office visit: 08/04/22 Lisabeth Devoid)  Scr: 1.23 (06/29/22)  Age: 77 Weight: 123.8kg  Appropriate dose. Refill sent.

## 2022-09-15 NOTE — Addendum Note (Signed)
Encounter addended by: Merideth Abbey on: 09/15/2022 7:59 AM  Actions taken: Imaging Exam ended, Charge Capture section accepted

## 2022-09-28 IMAGING — RF DG HIP (WITH PELVIS) OPERATIVE*R*
1 series · 2 of 2 positions shown · non-contrast
Comparison: 12/26/2019, 11/13/2019

CLINICAL DATA: 75-year-old male with right hip surgery

EXAM:
OPERATIVE RIGHT HIP (WITH PELVIS IF PERFORMED) 2 VIEWS
TECHNIQUE: Fluoroscopic spot image(s) were submitted for interpretation
post-operatively.

[Series 1: unknown protocol · 0.20mm/px · 2 of 2 slices shown]
[im 1/2]
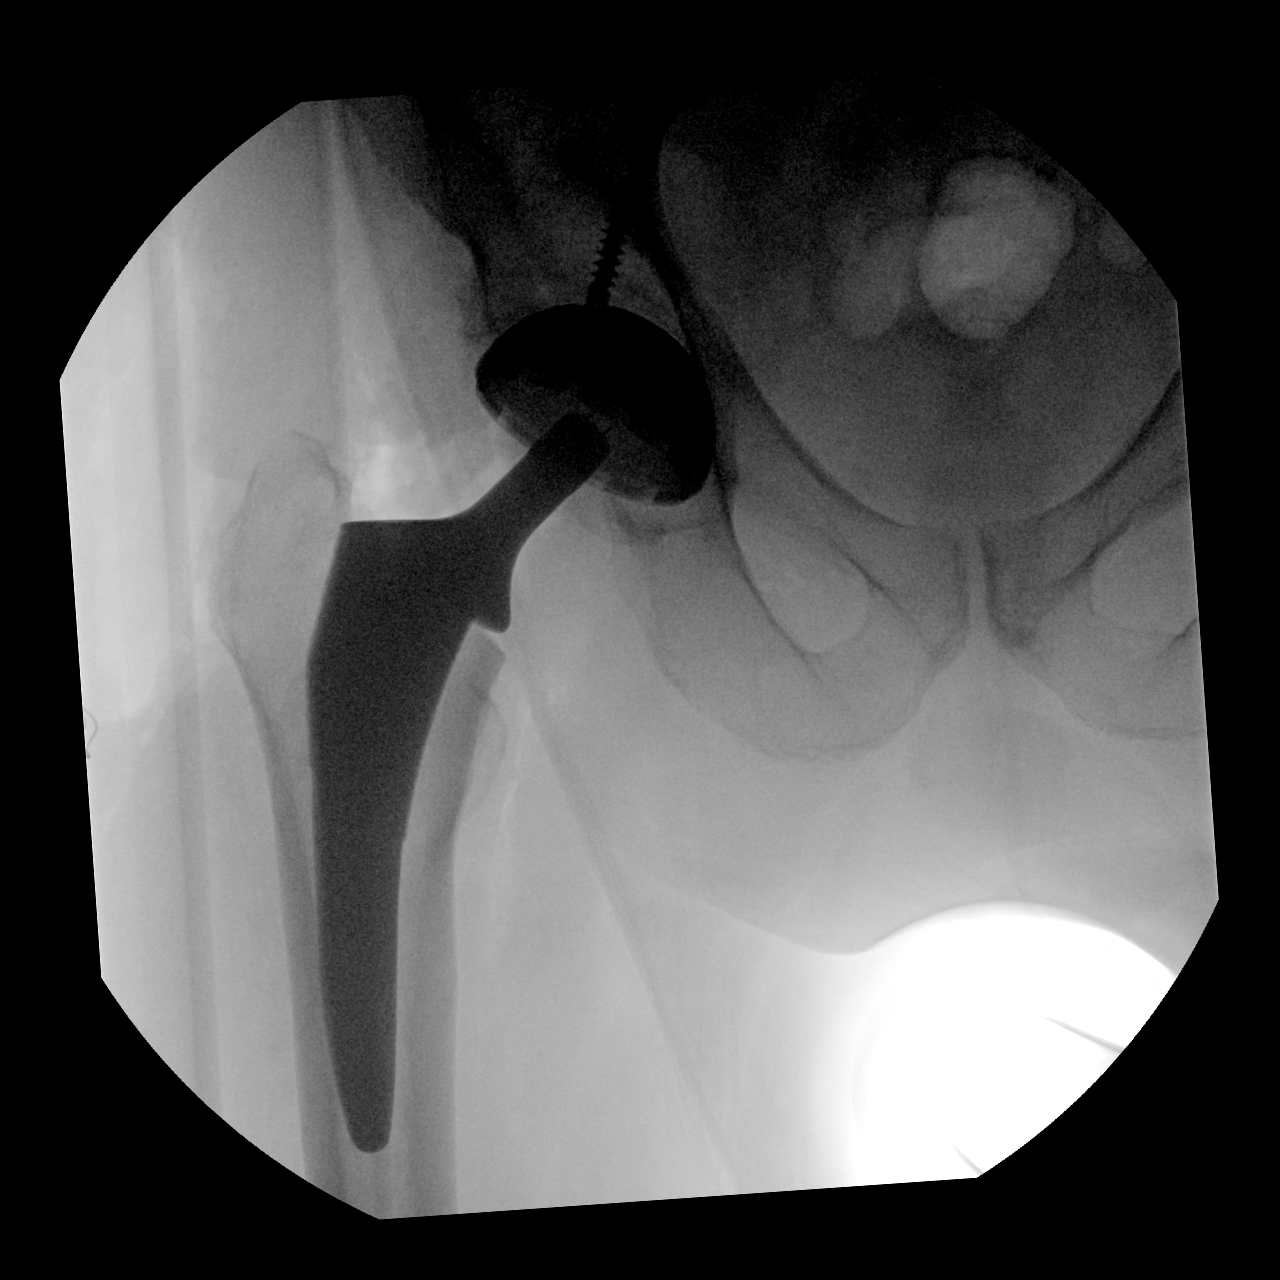
[im 2/2]
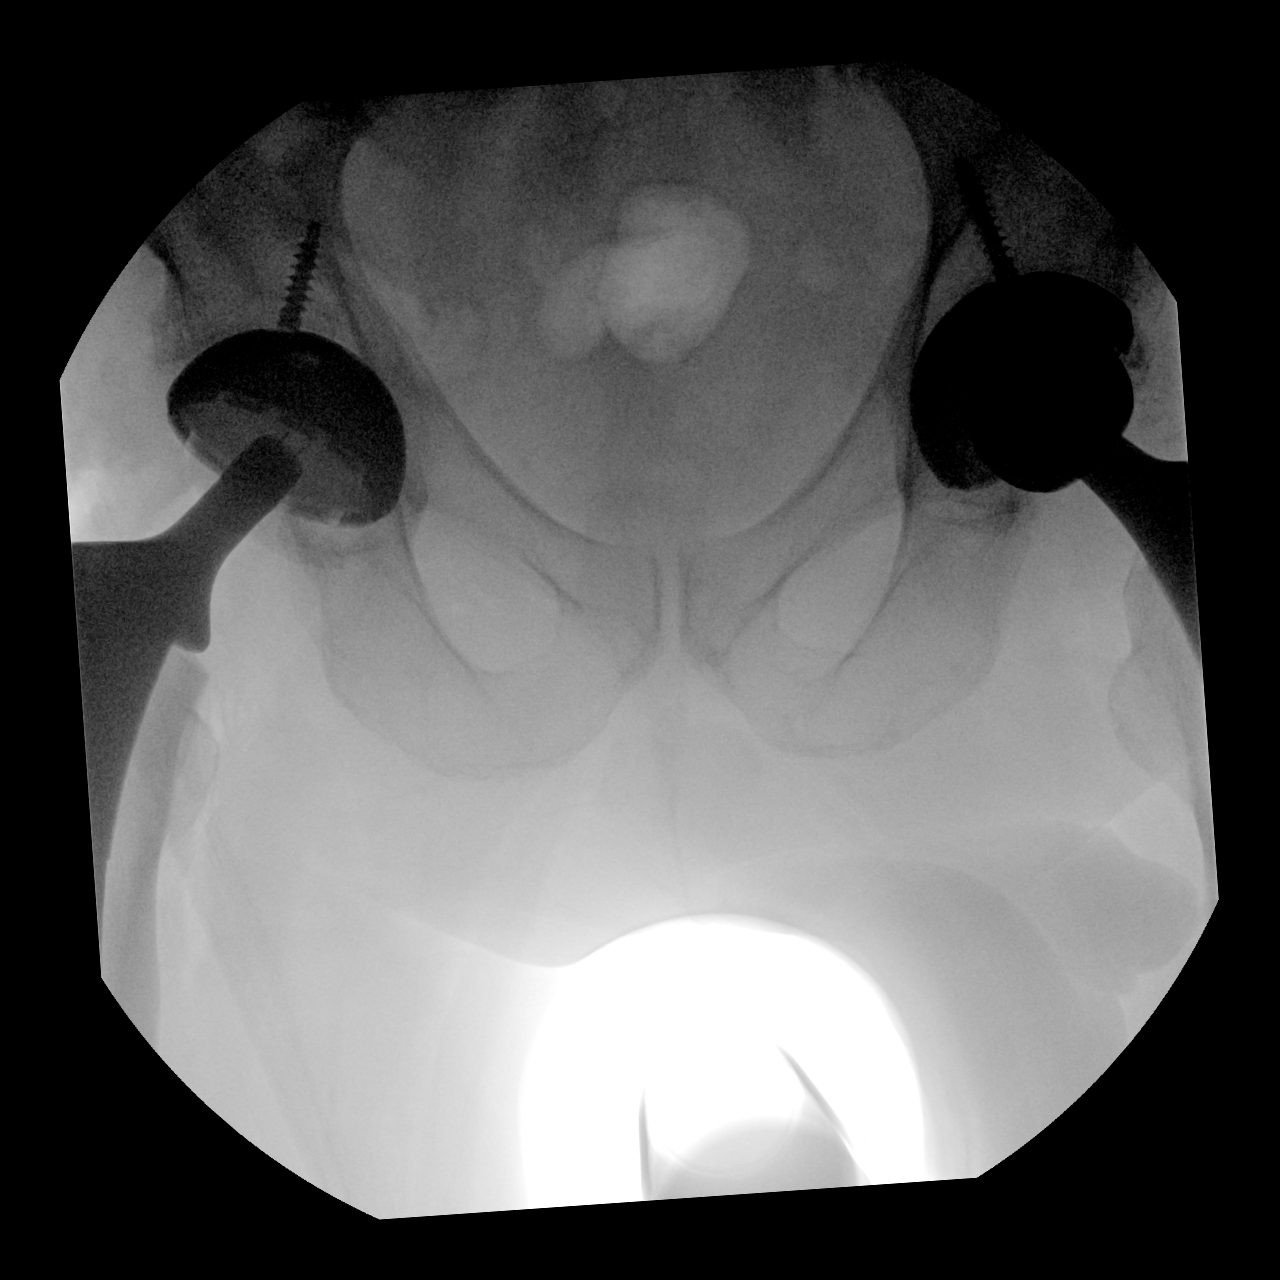

[2 of 2 positions shown; findings below may reference images not displayed]

FINDINGS: Limited intraoperative fluoroscopic spot images of the right hip
demonstrates interval right hip arthroplasty. No complicating
features.
IMPRESSION: Limited intraoperative fluoroscopic spot images demonstrates right
hip arthroplasty with no complicating features. Please refer to the
dictated operative report for full details of intraoperative
findings and procedure.

## 2022-09-28 IMAGING — DX DG PORTABLE PELVIS
1 series · 1 of 1 positions shown · non-contrast
Comparison: Earlier today.

CLINICAL DATA: Postop right hip replacement

EXAM:
PORTABLE PELVIS 1-2 VIEWS

[pelvis ap]
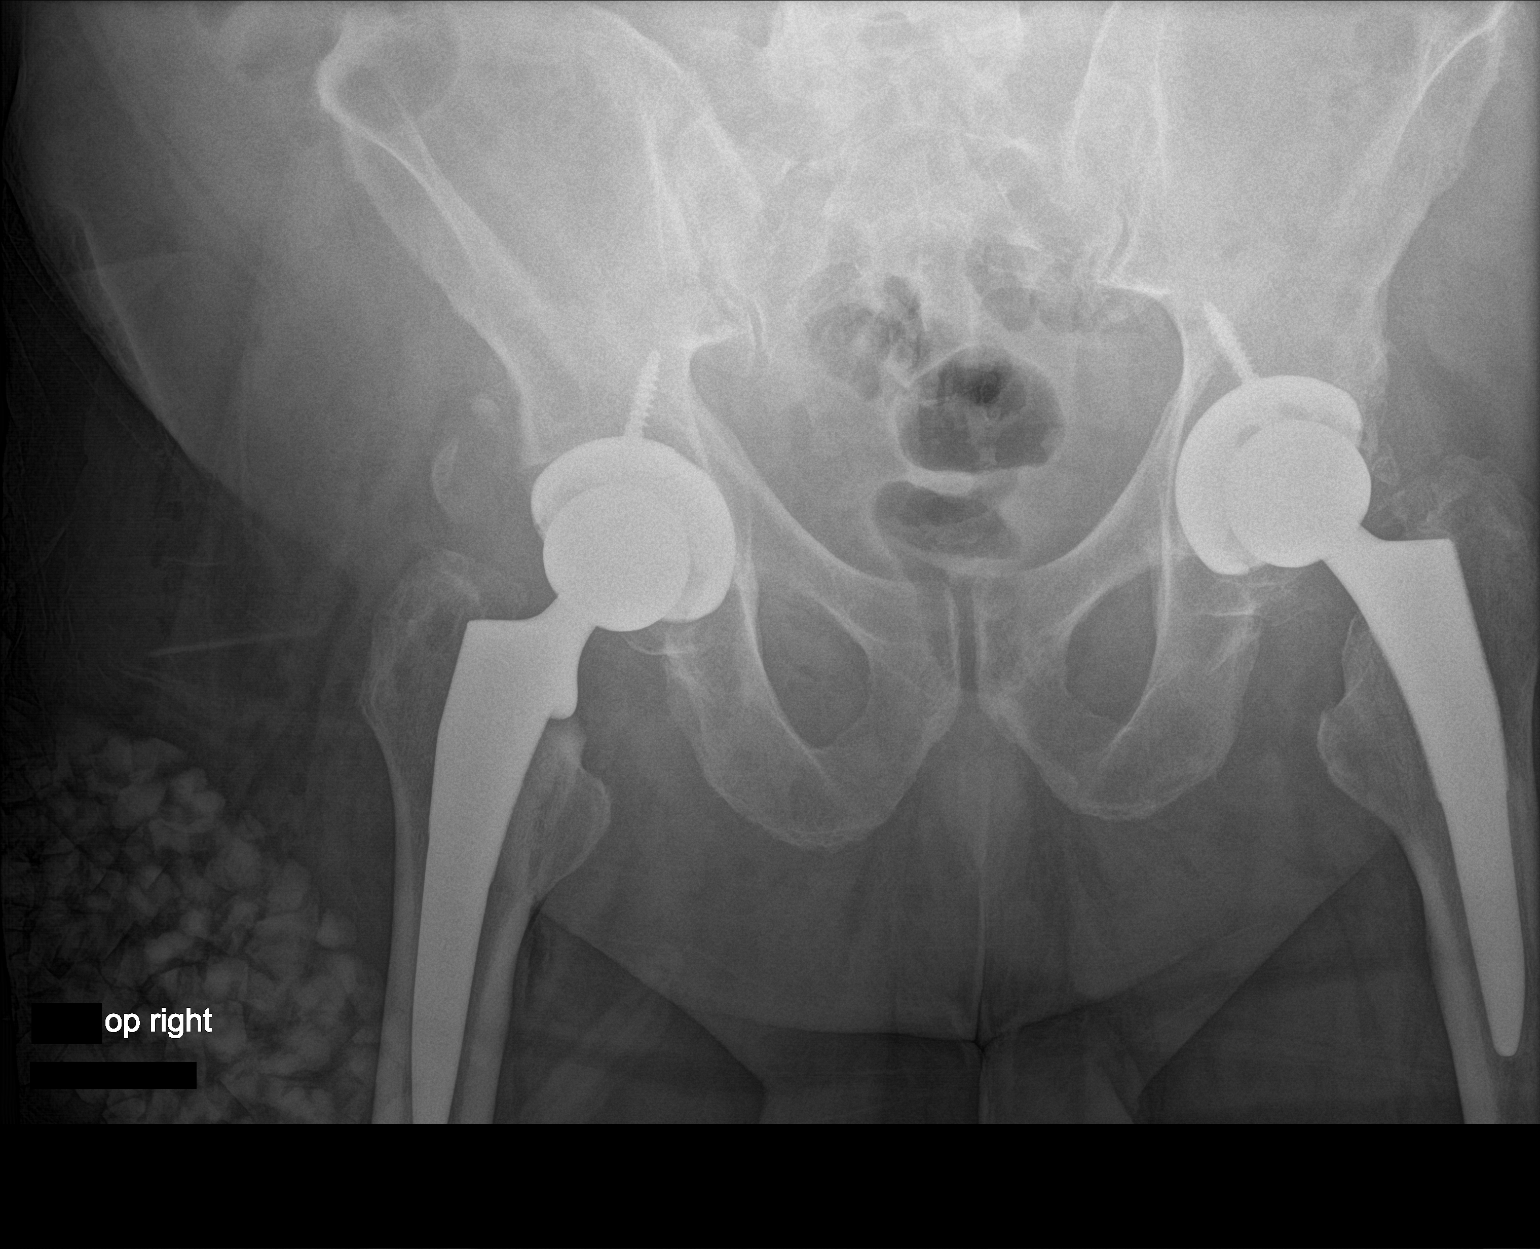

[1 of 1 positions shown; findings below may reference images not displayed]

FINDINGS: New total hip replacement performed on the right. Components appear
well positioned. No radiographically detectable complication.
Previous hip replacement on the left
IMPRESSION: New total hip replacement on the right, good appearance.

## 2022-10-05 ENCOUNTER — Ambulatory Visit: Payer: Medicare PPO | Admitting: Cardiology

## 2022-10-06 ENCOUNTER — Other Ambulatory Visit: Payer: Self-pay | Admitting: Cardiology

## 2022-10-07 DIAGNOSIS — G4733 Obstructive sleep apnea (adult) (pediatric): Secondary | ICD-10-CM | POA: Diagnosis not present

## 2022-10-13 NOTE — Progress Notes (Unsigned)
[name] [address] [place] [address] Phone: [phone] Subjective:   INadine Counts, am serving as a scribe for Dr. Antoine Primas.  I'm seeing this patient by the request  of:  Kuneff, Renee A, DO  CC: Neck and back pain  WNU:[id number]  Clayton [name] is a 77 y.o. male coming in with complaint of back and neck pain. OMT 09/09/2022.  Patient states doing well. Here for manipulation. Same per usual. No new concerns.  Medications patient has been prescribed: Kenalog cream  Taking:         Reviewed prior external information including notes and imaging from previsou exam, outside providers and external EMR if available.   As well as notes that were available from care everywhere and other healthcare systems.  Past medical history, social, surgical and family history all reviewed in electronic medical record.  No pertanent information unless stated regarding to the chief complaint.   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 Welton Flakes; CPAP titration 11/12/04- Dr. Welton Flakes   Squamous cell carcinoma of skin of neck    right; 2023    No Known Allergies   Review of Systems:  No headache, visual changes, nausea, vomiting, diarrhea, constipation, dizziness, abdominal pain, skin rash, fevers, chills, night sweats, weight loss, swollen lymph nodes, body aches, joint swelling, chest pain, shortness  of breath, mood changes. POSITIVE muscle aches  Objective  Blood pressure 128/84, pulse 78, height 6\' 3"  (1.905 m), SpO2 96%.   General: No apparent distress alert and oriented x3 mood and affect normal, dressed appropriately.  HEENT: Pupils equal, extraocular movements intact  Respiratory: Patient's speak in full sentences and does not appear short of breath  Cardiovascular: No lower extremity edema, non tender, no erythema  Neck exam does have some loss of lordosis. Loss of lordosis patient does have tightness noted on the left side of the neck as well as the left side of the parascapular area. Knee exam vital otherwise do show the patient does have some arthritic changes noted.  Trace effusion noted of the patellofemoral joints.  No significant instability noted.  Osteopathic findings  C2 flexed rotated and side bent right C5 flexed rotated and side bent left T3 extended rotated and side bent right inhaled rib T5 extended rotated and side bent left L2 flexed rotated and side bent right Sacrum right on right    Assessment and Plan:  Degenerative arthritis of knee, bilateral Arthritic changes noted.  Discussed icing regimen and home exercises.  Discussed which activities to do and which ones to avoid.  Increase activity slowly.  Follow-up again in 6 to 8 weeks.  Degenerative disc disease, lumbar Degenerative disc disease is still responds extremely well to osteopathic manipulation.  Worked on cervical spine more than we doing a usually.  Discussed  which activities to do and which ones to avoid.  Increase activity slowly.  Follow-up again in 6 to 8 weeks.    Nonallopathic problems  Decision today to treat with OMT was based on Physical Exam  After verbal consent patient was treated with HVLA, ME, FPR techniques in cervical, rib, thoracic, lumbar, and sacral  areas  Patient tolerated the procedure well with improvement in symptoms  Patient given exercises, stretches and  lifestyle modifications  See medications in patient instructions if given  Patient will follow up in 4-8 weeks     The above documentation has been reviewed and is accurate and complete Judi Saa, DO         Note: This dictation was prepared with Dragon dictation along with smaller phrase technology. Any transcriptional errors that result from this process are unintentional.

## 2022-10-14 ENCOUNTER — Ambulatory Visit: Payer: Medicare PPO

## 2022-10-14 ENCOUNTER — Ambulatory Visit: Payer: Medicare PPO | Admitting: Family Medicine

## 2022-10-14 ENCOUNTER — Encounter: Payer: Self-pay | Admitting: Family Medicine

## 2022-10-14 VITALS — BP 128/84 | HR 78 | Ht 75.0 in

## 2022-10-14 DIAGNOSIS — M170 Bilateral primary osteoarthritis of knee: Secondary | ICD-10-CM | POA: Diagnosis not present

## 2022-10-14 DIAGNOSIS — M5030 Other cervical disc degeneration, unspecified cervical region: Secondary | ICD-10-CM | POA: Diagnosis not present

## 2022-10-14 DIAGNOSIS — M47812 Spondylosis without myelopathy or radiculopathy, cervical region: Secondary | ICD-10-CM | POA: Diagnosis not present

## 2022-10-14 DIAGNOSIS — M9902 Segmental and somatic dysfunction of thoracic region: Secondary | ICD-10-CM

## 2022-10-14 DIAGNOSIS — M9904 Segmental and somatic dysfunction of sacral region: Secondary | ICD-10-CM

## 2022-10-14 DIAGNOSIS — M9903 Segmental and somatic dysfunction of lumbar region: Secondary | ICD-10-CM

## 2022-10-14 DIAGNOSIS — M5136 Other intervertebral disc degeneration, lumbar region: Secondary | ICD-10-CM | POA: Diagnosis not present

## 2022-10-14 DIAGNOSIS — M4802 Spinal stenosis, cervical region: Secondary | ICD-10-CM | POA: Diagnosis not present

## 2022-10-14 DIAGNOSIS — M9901 Segmental and somatic dysfunction of cervical region: Secondary | ICD-10-CM

## 2022-10-14 DIAGNOSIS — M9908 Segmental and somatic dysfunction of rib cage: Secondary | ICD-10-CM | POA: Diagnosis not present

## 2022-10-14 DIAGNOSIS — M1711 Unilateral primary osteoarthritis, right knee: Secondary | ICD-10-CM | POA: Diagnosis not present

## 2022-10-14 DIAGNOSIS — M25461 Effusion, right knee: Secondary | ICD-10-CM | POA: Diagnosis not present

## 2022-10-14 DIAGNOSIS — M25561 Pain in right knee: Secondary | ICD-10-CM | POA: Diagnosis not present

## 2022-10-14 NOTE — Patient Instructions (Addendum)
Xrays today Do prescribed exercises at least 3x a week See you again in 6-7 weeks

## 2022-10-14 NOTE — Assessment & Plan Note (Signed)
Degenerative disc disease is still responds extremely well to osteopathic manipulation.  Worked on cervical spine more than we doing a usually.  Discussed which activities to do and which ones to avoid.  Increase activity slowly.  Follow-up again in 6 to 8 weeks.

## 2022-10-14 NOTE — Assessment & Plan Note (Signed)
Arthritic changes noted.  Discussed icing regimen and home exercises.  Discussed which activities to do and which ones to avoid.  Increase activity slowly.  Follow-up again in 6 to 8 weeks.

## 2022-10-25 ENCOUNTER — Ambulatory Visit (INDEPENDENT_AMBULATORY_CARE_PROVIDER_SITE_OTHER): Payer: Medicare PPO | Admitting: Psychology

## 2022-10-25 DIAGNOSIS — F411 Generalized anxiety disorder: Secondary | ICD-10-CM

## 2022-10-25 NOTE — Progress Notes (Signed)
[facility] Counselor/Therapist Progress Note  Patient ID: Clayton [name], MRN: [id number]    Date: 10/25/22  Time Spent: 10:03 am - 10:49 am : 46 Minutes  Treatment Type: Individual Therapy.  Reported Symptoms: anxiety.   Mental Status Exam: Appearance:  Neat and Well Groomed     Behavior: Appropriate  Motor: Normal  Speech/Language:  Clear and Coherent  Affect: Appropriate  Mood: normal  Thought process: normal  Thought content:   WNL  Sensory/Perceptual disturbances:   WNL  Orientation: oriented to person, place, time/date, and situation  Attention: Good  Concentration: Good  Memory: WNL  Fund of knowledge:  Good  Insight:   Good  Judgment:  Good  Impulse Control: Good   Risk Assessment: Danger to Self:  No Self-injurious Behavior: No Danger to Others: No Duty to Warn:no Physical Aggression / Violence:No  Access to Firearms a concern: No  Gang Involvement:No   Subjective:   Rise Patience participated in the session, in person in the office with the therapist, and consented to treatment Alyas reviewed the events of the past week. Clayton [name] noted his wife's recent 10 day visit and noted this going well but noted stressors during her travel. He noted continued efforts towards using mindfulness at night to aid in his morning anxiety but noted that his anxiety has not subsided. He noted that his hypothesis is that "things will change" which leads to his AM anxiety including loss of ability, life drastically changing, or something affects his wife negatively such as financial strain due to illness. He noted his first wife passing (Clayton [name]) and noted this possibly playing part. We worked on explored this during the session. We worked on acceptance during the session and ways to acknowledge, accept, and move from this experience every morning in a positive manner. Therapist modeled this during the session. Clayton [name] was engaged and motivated during the session. He  expressed commitment towards our goals. Therapist praised Clayton [name] and provided supportive therapy. A follow-up was scheduled for continued treatment.   Interventions: CBT   Diagnosis:   Generalized anxiety disorder  Psychiatric Treatment: No , NA  Treatment Plan:  Client Abilities/Strengths [name] is intelligent, forthcoming, and motivated for change.   Support System: Family  Client Treatment Preferences Outpatient Therapy.   Client Statement of Needs [name] would like to manage his anxiety, specifically morning anxiety, including engaging in more mindfulness, processing past events, manage day to day stress,  process his anxiety (specifically upon waking), process loss of first wife and the effect of this on his anxiety in relation to his current wife, Clayton [name] (labeled PTSD by Clayton [name]).   Treatment Level Weekly  Symptoms  Anxiety: anxiety, difficulty managing worry, worrying about different things.   (Status: maintained)  Goals:   Clayton [name] experiences symptoms of Anxiety.   Treatment plan signed and available on s-drive:  No, pending patient signature.     Target Date: 06/15/23 Frequency: Weekly  Progress: 0 Modality: individual    Therapist will provide referrals for additional resources as appropriate.  Therapist will provide psycho-education regarding Makana's diagnosis and corresponding treatment approaches and interventions. Licensed Clinical Social Worker, [place], LCSW will support the patient's ability to achieve the goals identified. will employ CBT, BA, Problem-solving, Solution Focused, Mindfulness,  coping skills, & other evidenced-based practices will be used to promote progress towards healthy functioning to help manage decrease symptoms associated with his diagnosis.   Reduce overall level, frequency, and intensity of the feelings of depression, anxiety  evidenced by  decreased overall symptoms from 6 to 7 days/week to 0 to 1 days/week per client report for at  least 3 consecutive months. Verbally express understanding of the relationship between feelings of depression, anxiety and their impact on thinking patterns and behaviors. Verbalize an understanding of the role that distorted thinking plays in creating fears, excessive worry, and ruminations.  Clayton [name] participated in the creation of the treatment plan)    Delight Ovens, LCSW

## 2022-10-28 DIAGNOSIS — Z7901 Long term (current) use of anticoagulants: Secondary | ICD-10-CM | POA: Diagnosis not present

## 2022-10-28 DIAGNOSIS — H903 Sensorineural hearing loss, bilateral: Secondary | ICD-10-CM | POA: Diagnosis not present

## 2022-10-28 DIAGNOSIS — Z0110 Encounter for examination of ears and hearing without abnormal findings: Secondary | ICD-10-CM | POA: Diagnosis not present

## 2022-10-28 DIAGNOSIS — Z79899 Other long term (current) drug therapy: Secondary | ICD-10-CM | POA: Diagnosis not present

## 2022-10-28 DIAGNOSIS — H6123 Impacted cerumen, bilateral: Secondary | ICD-10-CM | POA: Diagnosis not present

## 2022-10-28 DIAGNOSIS — H8102 Meniere's disease, left ear: Secondary | ICD-10-CM | POA: Diagnosis not present

## 2022-10-28 DIAGNOSIS — H90A22 Sensorineural hearing loss, unilateral, left ear, with restricted hearing on the contralateral side: Secondary | ICD-10-CM | POA: Diagnosis not present

## 2022-11-02 ENCOUNTER — Telehealth: Payer: Self-pay | Admitting: Gastroenterology

## 2022-11-02 ENCOUNTER — Encounter: Payer: Self-pay | Admitting: Gastroenterology

## 2022-11-02 NOTE — Telephone Encounter (Signed)
Pt stated that he had two episodes of "orange tinged blood in his stool". 1 episode yesterday and 1 today with some orange leakage in his brief. Pt was questioned if it was stool and he stated that he thought that it was blood. No other GI symptoms.. Pt stated that he had a Laparoscopic Right Hemicolectomy 12/21/2021. Pt stated that he is out of town in in [place] about 8 hours aware. Pt was notified that I would contact him on 11/04/2022 for a symptom update and possibly schedule him for an office visit. ( 7 day Hold)  Pt verbalized understanding with all questions answered.

## 2022-11-02 NOTE — Telephone Encounter (Signed)
Inbound call from patient stating that he is seeing blood in his stool and is requesting a call from the nurse to discuss. Please advise.

## 2022-11-03 ENCOUNTER — Encounter: Payer: Self-pay | Admitting: Family Medicine

## 2022-11-04 NOTE — Telephone Encounter (Signed)
Pt was called for symptom update. Pt stated that the bleeding is better. Pt stated that he took a sitz bath yesterday which seems to help. Due to pt history pt was scheduled for an office visit to see Boone Master PA on 11/11/2022 at 1:30 for symptoms and discuss colonoscopy. Pt made aware. Pt verbalized understanding with all questions answered.

## 2022-11-09 ENCOUNTER — Encounter: Payer: Self-pay | Admitting: Cardiovascular Disease

## 2022-11-09 ENCOUNTER — Ambulatory Visit: Payer: Medicare PPO | Attending: Cardiovascular Disease | Admitting: Cardiovascular Disease

## 2022-11-09 VITALS — BP 160/90 | HR 89 | Ht 75.0 in | Wt 270.0 lb

## 2022-11-09 DIAGNOSIS — I10 Essential (primary) hypertension: Secondary | ICD-10-CM

## 2022-11-09 DIAGNOSIS — E782 Mixed hyperlipidemia: Secondary | ICD-10-CM | POA: Diagnosis not present

## 2022-11-09 DIAGNOSIS — K6389 Other specified diseases of intestine: Secondary | ICD-10-CM | POA: Diagnosis not present

## 2022-11-09 DIAGNOSIS — D6869 Other thrombophilia: Secondary | ICD-10-CM

## 2022-11-09 DIAGNOSIS — I480 Paroxysmal atrial fibrillation: Secondary | ICD-10-CM

## 2022-11-09 DIAGNOSIS — G4733 Obstructive sleep apnea (adult) (pediatric): Secondary | ICD-10-CM | POA: Diagnosis not present

## 2022-11-09 NOTE — Progress Notes (Signed)
Patient ID: Clayton [name], male   DOB: 17-May-1945, 77 y.o.   MRN: [id number]        HPI: Clayton [name] is a 77 y.o. male who presents for a 5 month follow-up cardiology evaluation.  Mr. Ragin has a history of hypertension, a remote history of PAF, status post cardioversion in 2007, obstructive sleep apnea for which he uses CPAP 100% of the time, hypertension, and hyperlipidemia. He has had weight fluctuations over the years.  In the past, he also had a history of Mnire's disease.  There was some concern by Dr. Dorma Russell that perhaps this may have been related to statin use, which led to its discontinuance.  He is not had Mnire's disease in some time.  He underwent left hip replacement surgery by Dr. Charlann Boxer and now has been able to continue to be active and exercises routinely.   His  blood pressure regimen has been amlodipine/benazepril 10/40 daily, hydrochlorothiazide has been taking 25 Mill grams in the evening and Toprol-XL 100 mg daily. He had atrial of livalo  For hyperlipidemia but developed some diarrhea secondary to this.    He has not had much success with weight loss over the past several years.    He continues to use his CPAP therapy with 100% compliance.  He will not even take a nap without his CPAP therapy and he takes his CPAP unit on alll his travels.   Since initiating CPAP therapy, he is unaware of any palpitations.  He denies any recurrent atrial fibrillation.  In the past he has had difficulty with family stress and his father had passed away, and his wife's mother recently died after having developed lung CA and had significant PVD.  He was not exercising as much as he had in the past.  He has not been successful in weight loss.  He continues to be active and still walks 3-4 miles per day.  Oftentimes in the morning when he awakens his blood pressure is elevated at 150/90 but after exercise it drops to 128/80.  He is unaware of any recurrent atrial fibrillation.  He  continues to use CPAP with 100% compliance.   He completed a two-year grieving process.  On 01/19/2016 he committed to weight loss.  On January 1, he weighed 278 pounds .  He has been walking 4 miles 5 days per week as well as some intermittent stationary bike.  He has lost a total of 32 pounds since January.  He continues to use CPAP with 100% compliance and since he has been on CPAP, he has not had any recurrent atrial fibrillation and his blood pressure has been less labile.  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  He has had failures to Crestor and Lipitor in the past due to significant myalgias, development, even with weekly dosing.  I resumed zetia   In August 2018 he noticed his heart rate increasing and he presented for evaluation and was seen by Theodore Demark, PAC.  He admitted to a rare palpitation.  He denied any chest pain with exertion or dyspnea on exertion.  He has suffered a broken rib.  This year.  He has a weight goal of 220 pounds.    I saw him in September 2018, at which time he was doing well from a cardiovascular standpoint.   His father was ill and ultimately passed away in [place].  As result he was going back and forth to the [facility] area.  This resulted in a change in his diet and ability to exercise.  I last saw him in October 2019 and his weight had increased from 240 back up to 270 pounds.  He was unaware of any episodes of atrial fibrillation.  He was using CPAP with 100% compliance along with his naps.  He was committed to begin weight loss again.    Since I saw him on October 19, 2018 he has had major lifestyle change.  He has been working with Dr. Dalbert Garnet in the healthy weight and wellness group.  Peak weight was 280 and most recent weight 244.  I saw him for a cardiology evaluation on August 09, 2018.  O ver the past 4 months, he had been under increased stress as result of his wife's illness.  He has been checking his blood pressure regularly and this has  been stable but on his blood pressure recording he has noticed the message of heart rate irregularity.  He is unaware of A. fib but has noticed this message for the last 3 to 4 months.  He is exercising every day and typically walks 2 miles in the morning and in the afternoon or evening does 30 minutes of stationary bike at least 5 to 6 days/week.  He continues to use CPAP.  I obtained a download in the office today from June 22 through August 08, 2018.  He is 100% compliant.  He has a ResMed air sense 10 auto unit with a pressure range of 8-20 with 95% pressure at 10.1 and maximum average and 11.5.  AHI is excellent at 1.2.     During his August 09, 2018 encounter his ECG demonstrated that he was back in atrial fibrillation and had a ventricular rate at 89 bpm.  There was LVH with repolarization changes.  At that time, I recommended discontinuance of amlodipine/benazepril combination and in its place started Cardizem CD 180 mg rather than amlodipine and olmesartan 20 mg rather than benazepril.  I initiated anticoagulation with Eliquis 5 mg twice a day.  He underwent a 2D echo Doppler study on August 16, 2018 which showed EF low normal at 50 to 55%.  There was evidence for significant LVH and moderate dilation of his left atrium.  I saw him for follow-up evaluation on August 23, 2018.  At that time he was now cognizant of his heart rate irregularity.  He has continued to use CPAP with excellent compliance.  A new download was obtained from July 5 through August 21, 2018 which shows 100% compliance with average usage 7 hours and 28 minutes.  AHI is 1.6 and his 95th percentile auto pressure is 10.1 with a maximum average pressure of 11.4.  He states he has lost weight over the past several weeks purposefully.  He continues to exercise now on a stationary bike.  He denies any chest pain PND orthopnea.  He was tolerating Eliquis without bleeding.  During that evaluation, we discussed different options including several  additional weeks of increased weight control versus initiating antiarrhythmic therapy.  Since his only other episode of atrial fibrillation previously occurred 13 years ago and he had not had any recurrence until this year he opted for an increased rate control trial.  As result metoprolol dose was further titrated.  I saw on September 15, 2018 for follow-up evaluation.  At that time remained in atrial fibrillation despite his increase metoprolol succinate to 100 mg in the morning and 50 mg at night with continuation of  diltiazem 180 mg, spironolactone 12.5 mg daily in addition to his olmesartan 20 mg.  I scheduled him for DC cardioversion but because of the need to obtain a COVID test I was unable to schedule this to be done by me prior to going on vacation.  He ultimately underwent the successful cardioversion by Dr. Bufford Buttner successfully and received 1 shock of 200 J of biphasic synchronized rhythm with restoration of sinus rhythm.  Chemistry profile done prior to the cardioversion revealed his creatinine had increased to 1.69 and as result I recommended he reduce his olmesartan down to 10 mg from his dose of 20 mg.  I saw him on October 10, 2018 in follow-up of his cardioversion.  At that time he was maintaining sinus rhythm and feeling well.  He had more energy.  His stress level had significantly reduced since his wife had successful L5-S1 back surgery by Dr. Venetia Maxon.  He was continuing to use CPAP with 100% compliance.    I evaluated him on January 09, 2019.  At that time he stated that over the 3 months previous he had continued to feel well and remained asymptomatic.  At times there are still periods of increased stress.  He continued to use CPAP with 100% compliance.  A download was obtained from November 22 through January 08, 2019 which showed 100% compliance.  Average usage was 6 hours 47 minutes.  AHI is excellent at 1.4 with his AutoSet CPAP minimum pressure set at 8 and maximum of 20, with 95th  percentile pressure 10.2 with maximum average pressure 12.0.  During his evaluation, his ECG verify that he was back in atrial fibrillation.  At the time, he did not inform me that he had noted some irregularity but retrospectively this may have been going on for several weeks prior to that evaluation.  He was continuing to walk daily and exercising on his bike and was asymptomatic without shortness of breath.  During that evaluation I had a long discussion with him regarding possible EP evaluation for consideration of ablation.  After his significant discussion elected to initiate an attempt at antiarrhythmic therapy with low-dose flecainide initially at 50 mg twice a day with plans for office visit in 2 weeks.  He has continued to be on Eliquis for anticoagulation in addition to Zetia for his hyperlipidemia.  When I saw him on January 24, 2019 he stated that he had felt some fatigue once flecainide was instituted.  He was feeling well but this past Sunday he had been working very hard going up and down numerous steps carrying boxes with heavy exertion making at least 40 trips carrying Christmas girls back upstairs.  He was tired.  Sunday evening while sitting down he became disoriented anxious and it appeared that there was possibly some transient stress of aphasia.  He denied any focal weakness.  His symptoms ultimately resolved after 30 minutes.  He had called the office during the workweek and was concerned about his symptoms possibly secondary to flecainide.  He had self reduced his flecainide dose to just once a day rather than twice a day and is worked into my schedule today for follow-up evaluation.  Presently, he has no residual issues since that event several days ago.    At his January 2021 evaluation, I recommended he undergo a neurologic evaluation with Dr. Pearlean Brownie as well as discussed an EP evaluation for possible consideration of future atrial fibrillation ablation.  He saw Dr. Elberta Fortis on February 08, 2019  and was in persistent atrial fibrillation.  Was discussion concerning continuing flecainide with subsequent high voltage cardioversion.  He saw Dr. Pearlean Brownie February 27, 2019 and after a long evaluation it was felt that his transient episode of word finding difficulty and anomia likely represented mild cognitive impairment rather than TIA or seizure.  He subsequently saw Dr Elberta Fortis back on March 05, 2019 and on 323 he underwent successful cardioversion with Dr. Duke Salvia which required 3 shocks with restoration of sinus rhythm.  He was evaluated by me in March 2021 at which time he felt his heart rhythm had remained stable.    He was under considerable increased stress to his wife had fallen in sustained a fracture to her humerus as well as tear to her rotator cuff and biceps tendon.  Ul diltiazem was discontinued and he was started on amlodipine 5 mg and was told to continue olmesartan 20 mg in addition to his metoprolol and spironolactone.  Timately she underwent surgery.  He denies chest pain PND orthopnea.  He has continued to use CPAP download from March 05, 2019 through April 03, 2019 continues to show excellent compliance with an AHI of 2.7 and 95th percentile pressure 11.4 cm.   He was evaluated by Judy Pimple June 20, 2019.  He denies chest pain or shortness of breath.  He had noted some elevated blood pressures as well as some dyspnea on exertion.  During that evaluation his diltiazem was discontinued and he was started on amlodipine 5 mg.  He was told to continue his present dose of olmesartan, metoprolol, spironolactone.  During that evaluation he was maintaining sinus rhythm but had bradycardia and first-degree heart block.  Flecainide was reduced down to 50 mg twice a day since his symptoms of fatigability and dyspnea seem to occur at the higher dose.  With return to a lower dose his symptoms resolved.  I evaluated him in June 2021 over the prior 3 months he denied any recurrent  episodes of chest pain.    He continues to have issues in the first 10 minutes of his CPAP use which I suspect may be related to not having a ramp time on his machine which can easily be adjusted.  His blood pressure does continue to be somewhat labile but he states at home most of the time it is controlled.  He was unaware of any recurrent episodes of atrial fibrillation.  He denied presyncope or syncope.    He underwent a nuclear stress test which apparently had been ordered for dyspnea on exertion.  This was low risk study and demonstrated a hypertensive response to exercise.  A defect was felt to be present in the inferior wall which was interpreted as scar with mild peri-infarct ischemia, which may also have been exacerbated by his body habitus and diaphragmatic attenuation.  Currently he remains asymptomatic and denies chest pain or shortness of breath.  He has been evaluated by Dr. Maximino Sarin for left cochlear Mnire's disease.  He underwent audiogram which showed mild to moderate severity high-frequency sensorineural neural hearing loss in the right ear and mild to moderately severe downsloping sensorineural neural hearing loss in the left ear.   I saw him in October 2021 at which time he denied any recurrent episodes of atrial fibrillation.  He  continued to be on amlodipine 5 mg, metoprolol XL 100 mg in the evening with 25 or 50 mg in the morning, olmesartan 40 mg, and spironolactone 12.5 mg daily.  He was on  Eliquis for anticoagulation.  He wastolerating Livalo 2 mg and Zetia for hyperlipidemia with LDL cholesterol at 55 in June 2021.  He tries to play golf several times a week if possible and continues to score well.  He continues to be followed by Dr. Quillian Quince with plans for continued weight loss.  I saw him in February 2022 and since his prior evaluation he was having issues with his knees.   He had undergone removal of his meniscus.  He has been followed by Terrilee Files he recently had a  hyaluronic acid injection with benefit.  He also has undergone evaluation with Dr. Constance Goltz with discomfort in his right hip.  He has low back issues at L5-S1 and is now on gabapentin.  He is unaware of recurrent atrial fibrillation.  Apparently he is was only taking flecainide 50 mg daily instead of twice a day.  He continued to be on amlodipine 7.5 mg, Eliquis 5 mg twice a day, metoprolol succinate 150 mg in the evening and spironolactone 12.5 mg daily.  At times he he has some mild blood pressure elevation.  He continued to be evaluated at healthy weight and wellness.  Because of his knee and hip issues he has not been playing as much golf as he had in the past.  He continuesd to use CPAP with 100% compliance. A download was obtained from January 20 through March 07, 2020.  He is averaging 7 hours and 2 minutes of CPAP use per night.  AHI is 1.5 with a 95th percentile pressure at 11.5 and maximum average pressure at 13.  During that evaluation, his QTc interval was prolonged at 497 and rather than further increase flecainide I recommended titration of metoprolol to 50 mg in the morning and 100 mg at night.  He continued to be on Livalo 2 mg daily and Zetia for hyperlipidemia.  I saw him on July 17, 2020.  Since his prior evaluation he developed an episode of recurrent atrial fibrillation and was seen by Joni Reining, NP on June 27, 2020 and was scheduled to see Dr. Elberta Fortis on June 30, 2020.  During that evaluation, his flecainide was discontinued and amiodarone 400 mg twice a day was initiated for 2 weeks with plans to reduce to 400 mg for 2 weeks and then 200 mg daily.  He was scheduled to undergo DC cardioversion on July 14 2020 and upon arrival for the procedure he was found to have converted to sinus rhythm and cardioversion was not performed.  When seen by Dr. Elberta Fortis, the patient had already a scheduled right hip replacement surgery to be done by Dr. Charlann Boxer on August 12, 2020.  Dr. Elberta Fortis has scheduled  Mr. Laspisa to undergo cardioversion in late August 2022.  He was using CPAP with 100% compliance with AHI of 1.7 and 95th percentile pressure 10.7 with maximum average pressure 12.7 centimeters of water.  He underwent CT cardiac morphology/pulmonary vein prior to his ablation.  Calcium score was 42.7 Aggrastat units which is the 25th percentile for age, race and sex matched control.  He had an upper normal ascending aorta size at 3.9 cm.  He underwent successful atrial fibrillation ablation with Dr. Elberta Fortis on September 02, 2020.  He saw him in follow-up on December 16, 2020 and was maintaining sinus rhythm and was taken off flecainide.  I saw him on January 22, 2021 at which time he was maintaining sinus rhythm and continued to be on  Eliquis 5 mg twice a  day.  He has been on amlodipine 7. 5 mg daily, metoprolol succinate 50 mg twice a day, olmesartan 40 mg daily in addition to spironolactone 12.5 mg daily for hypertension.  He is on Livalo 2 mg with Zetia 10 mg for hyperlipidemia.  He continues to see Dr. Dalbert Garnet at healthy weight and wellness program.  He has resumed his activity and is walking 2 miles in the morning and riding a stationary bike for 30 minutes later in the afternoon.    He underwent an echo Doppler study on July 24, 2021 which showed EF 50 to 55% (54.2% on biplane).  He had moderate left atrial dilatation.  There was mild dilation of his aortic root at 43 mm and aortic arch at 45 mm.  He has been seen by Dr. Elberta Fortis and more recently by Maxine Glenn, PA-C.  He was noted to have very brief SVT on a long-term monitor.  He has continued to use CPAP and a download from September 7 through October 23, 2021 showed average use at 7 hours and 37 minutes per night.  AHI was excellent at 0.9 with his pressure range 6 to 8 to 20 cm of water.  He had never had a colonoscopy recently had a Cologuard test recommended by his primary physician which was positive.  He subsequently underwent colonoscopy by  Dr. Annabelle Harman and a small mass was detected in the colon with biopsy positive for malignancy.  He is undergoing further evaluation with CT imaging with plans to for surgical consultation.    I saw him on December 02, 2021. Since his prior evaluation, he underwent CT imaging and no findings of metastatic disease in the chest abdomen or pelvis was detected.  He is tentatively scheduled to undergo surgery on December 4 with Dr. Marin Olp for resection of his colonic mass.  Presently, he continues to feel well.  He denies any chest pain or shortness of breath.  He continues to be on amlodipine 5 mg, metoprolol succinate 100 mg twice a day, olmesartan 40 mg, and spironolactone 12.5 mg daily for hypertension.  He is on Eliquis anticoagulation and denies bleeding.  He is on Zetia/Livalo for hyperlipidemia.  He has been taking over-the-counter ferrous sulfate.  He continues to use CPAP with 100% compliance.  CPAP setting is 8 to 20 cm of water and AHI is excellent at 0.7 with 95th percentile pressure 10.5 with maximum average pressure 11.9 cm of water.   Since his last evaluation, he underwent successful right hemicolectomy for ascending colon adenocarcinoma on December 21, 2021 by Dr. Marin Olp.  Surgery was uncomplicated and tumor margins were negative.  He has subsequently been seen by Dr. Myrtie Neither of GI March 08, 2022 and was doing well.  He is now trying to get back in shape.  He has had some difficulty with his left knee and has been using a stationary bike rather than walking.  He underwent biopsy and resection of a skin lesion on his neck which was squamous cell CA.   I saw him on April 12, 2022.  He felt well and denied any chest pain, palpitations, or awareness of any recurrent atrial fibrillation.   He admits to some occasional ankle swelling.  He continues to use CPAP with 100% compliance and has been on CPAP for 17 years.  A download was obtained from February 24 through April 11, 2022 which  shows 100% compliance with average use at 7 hours and 15 minutes.  AHI is excellent  at 1.3.  His CPAP is set at a pressure range of 8 to 20 cm.  95th percentile pressure was 10.8 with maximum average pressure 13.0.  Presently he is on amlodipine 7.5 mg, metoprolol succinate which he takes 50 mg twice a day, spironolactone 25 mg alternating with 12.5 mg on Monday Wednesday and Friday.  He is on Zetia 10 mg and Livalo 2 mg daily.  He is on metformin 500 mg daily.  During that evaluation, he had 1+ ankle edema and his blood pressure was elevated with stage II hypertension.  I recommended further titration of amlodipine to 10 mg and increase spironolactone to 25 mg alternating with 12.5 mg every other day.  He apparently had been taken off olmesartan previous to my evaluation which I suspect was in December 2023 when his creatinine was increased at 1.62.  I recommended slight titration of metoprolol succinate from 50 mg twice a day to 75 mg in the morning and 50 mg at night and follow-up laboratory was recommended.  I last saw him on Jun 04, 2022 at which time he felt well.  He was continuing to be active and has been spreading mulch in his yard.  He typically notes his resting heart rate in the 90s.  He is unaware of any recurrent atrial fibrillation.  He continues to use CPAP at night [employer] is now his DME company.  I obtained a download in the office today which shows excellent use with average use at 7 hours and 40 minutes per night.  His CPAP is set at a pressure range of 8 to 20 cm of water and AHI is 1.4.  95th percentile pressure is 11.2 with maximum average pressure of 12.6.  His ECG showed normal sinus rhythm at 94 bpm with right bundle branch block and left anterior hemiblock.  His blood pressure continued to be elevated and I recommended titration of metoprolol succinate to 100 mg in the morning and 75 mg in the evening.  He was evaluated by Azalee Course, PA on August 04, 2022 after his blood pressure machine  the night before suggested possible irregular rhythm.  When seen by Azalee Course, he was maintaining sinus rhythm and did not have recurrent AF.  His average blood pressure at home systolic was 125 mmHg.  Since I last saw him, he has continued to feel well.  He states blood pressure at home typically is in the 120s over 70s.  He now is wearing a left hearing aid.  He does have some periods of blood pressure lability.  He has internal hemorrhoids and did have bleeding noted for short duration and he self held Eliquis for 3 days.  He is now back on Eliquis 5 mg twice a day.  He continues to be on amlodipine 10 mg, metoprolol succinate 100 mg in the morning and 75 mg in the evening in addition to spironolactone 25 mg daily.  He is on PET-avid statin 2 mg for hyperlipidemia which she tolerates well in addition with Zetia 10 mg.  He continues to use CPAP and his DME company is [employer].  I obtained a download from September 21 through November 07, 2022.  He continues to be compliant with usage averaging 7 hours and 21 minutes on days used.  His pressure set at a range of 8 to 20 cm, and his 95th percentile pressure is 11.0 with maximum average pressure 13.3.  AHI is 1.5.  He presents for evaluation.  Past Medical History:  Diagnosis Date   Aortic atherosclerosis (HCC) 12/21/2021   Arthritis    hips    Atrial fibrillation (HCC)    Cataract    Chronic diastolic heart failure (HCC) 12/21/2021   Cochlear Meniere syndrome of left ear 06/09/2015   CPAP (continuous positive airway pressure) dependence    Dysrhythmia    PAF- hx of 7 years ago    HLD (hyperlipidemia)    Hypertension 05/29/2010   ECHO-EF 67% wnl   Meniere disease    Obesity    OSA on CPAP 08/20/2012   Palpitations    S/P right total hip arthroplasty 08/12/2020   Sleep apnea 09/14/2004   [facility]- Dr. Welton Flakes; CPAP titration 11/12/04- Dr. Welton Flakes   Squamous cell carcinoma of skin of neck    right; 2023    Past Surgical History:   Procedure Laterality Date   ATRIAL FIBRILLATION ABLATION N/A 09/10/2020   Procedure: ATRIAL FIBRILLATION ABLATION;  Surgeon: Regan Lemming, MD;  Location: [facility] INVASIVE CV LAB;  Service: Cardiovascular;  Laterality: N/A;   CARDIAC CATHETERIZATION  11/03/2005   CARDIOVERSION N/A 09/26/2018   Procedure: CARDIOVERSION;  Surgeon: Sande Rives, MD;  Location: [facility] ENDOSCOPY;  Service: Endoscopy;  Laterality: N/A;   CARDIOVERSION N/A 03/13/2019   Procedure: CARDIOVERSION;  Surgeon: Chilton Si, MD;  Location: [facility] ENDOSCOPY;  Service: Cardiovascular;  Laterality: N/A;   CARDIOVERSION N/A 03/03/2021   Procedure: CARDIOVERSION;  Surgeon: Wendall Stade, MD;  Location: [facility] ENDOSCOPY;  Service: Cardiovascular;  Laterality: N/A;   CATARACT EXTRACTION  01/19/2012   KNEE ARTHROSCOPY     left knee- 2002    LAPAROSCOPIC PARTIAL RIGHT COLECTOMY Right 12/21/2021   Procedure: LAPAROSCOPIC RIGHT HEMICOLECTOMY;  Surgeon: Andria Meuse, MD;  Location: [facility] ORS;  Service: General;  Laterality: Right;   TOTAL HIP ARTHROPLASTY  06/08/2011   Procedure: TOTAL HIP ARTHROPLASTY ANTERIOR APPROACH;  Surgeon: Shelda Pal, MD;  Location: [facility] ORS;  Service: Orthopedics;  Laterality: Left;   TOTAL HIP ARTHROPLASTY Right 08/12/2020   Procedure: TOTAL HIP ARTHROPLASTY ANTERIOR APPROACH;  Surgeon: Durene Romans, MD;  Location: [facility] ORS;  Service: Orthopedics;  Laterality: Right;    No Known Allergies   Current Outpatient Medications  Medication Sig Dispense Refill   amLODipine (NORVASC) 10 MG tablet Take 1 tablet (10 mg total) by mouth daily. 90 tablet 3   Coenzyme Q10 200 MG capsule Take 200 mg by mouth at bedtime.     cyanocobalamin (VITAMIN B12) 100 MCG tablet Take 200 mcg by mouth daily. Patient takes a gummie     ELIQUIS 5 MG TABS tablet TAKE 1 TABLET BY MOUTH TWICE A DAY 180 tablet 1   ezetimibe (ZETIA) 10 MG tablet TAKE 1 TABLET (10 MG TOTAL) BY MOUTH IN THE MORNING 90 tablet 1   ferrous  sulfate 325 (65 FE) MG EC tablet Take 325 mg by mouth in the morning and at bedtime.     metFORMIN (GLUCOPHAGE) 500 MG tablet TAKE 1 TABLET BY MOUTH EVERY DAY WITH BREAKFAST 90 tablet 1   metoprolol succinate (TOPROL-XL) 50 MG 24 hr tablet Take 100 mg (2 tablets) by mouth in the morning, and 75 mg (1.5 tablets) in the evening. 360 tablet 3   Multiple Vitamins-Minerals (MULTIVITAMIN WITH MINERALS) tablet Take 1 tablet by mouth daily.     Pitavastatin Calcium 2 MG TABS TAKE 1 TABLET BY MOUTH EVERY DAY IN THE EVENING 90 tablet 3   spironolactone (ALDACTONE) 25 MG tablet Take 1 tablet (25  mg total) by mouth daily. 135 tablet 1   tamsulosin (FLOMAX) 0.4 MG CAPS capsule Take 0.4 mg by mouth in the morning.     triamcinolone cream (KENALOG) 0.5 % APPLY 1 APPLICATION TOPICALLY 2 (TWO) TIMES DAILY. TO AFFECTED AREAS. 30 g 3   Zinc Sulfate (ZINC 15 PO) Take 200 mcg by mouth. Patient takes agummie     Na Sulfate-K Sulfate-Mg Sulf (SUPREP BOWEL PREP KIT) 17.5-3.13-1.6 GM/177ML SOLN Take 1 kit by mouth as directed. 324 mL 0   No current facility-administered medications for this visit.    Socially he is remarried after his first wife passed away. He has 2 children. He is retired  and was the Charity fundraiser at [employer]. there is no tobacco history.  He plays golf at least 2 days/week and this past year not only did he have a hole in 1 but he has also shot his age.  ROS General: Negative; No fevers, chills, or night sweats;  HEENT: Negative; No changes in vision or hearing, sinus congestion, difficulty swallowing Pulmonary: Negative; No cough, wheezing, shortness of breath, hemoptysis Cardiovascular: See HPI GI: Recent colonoscopy with colonic mass; status post right hemicolectomy for ascending colon adenocarcinoma GU: Negative; No dysuria, hematuria, or difficulty voiding Musculoskeletal: He is status post rib fracture.  He is status post hip replacement.  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 Psychiatric: Negative; No behavioral problems, depression Sleep: Positive for obstructive sleep apnea on CPAP therapy with 100% compliance No snoring, daytime sleepiness, hypersomnolence, bruxism, restless legs, hypnogognic hallucinations, no cataplexy Other comprehensive 14 point system review is negative.  PE BP (!) 160/90   Pulse 89   Ht 6\' 3"  (1.905 m)   Wt 270 lb (122.5 kg)   SpO2 99%   BMI 33.75 kg/m    Repeat blood pressure by me was initially 162/84.  He states blood pressure at home typically is in the 120s over 70s.  Wt Readings from Last 3 Encounters:  11/11/22 265 lb (120.2 kg)  11/09/22 270 lb (122.5 kg)  08/04/22 273 lb (123.8 kg)  In 2021 he had lost approximately 40 pounds  General: Alert, oriented, no distress.  Skin: normal turgor, no rashes, warm and dry HEENT: Normocephalic, atraumatic. Pupils equal round and reactive to light; sclera anicteric; extraocular muscles intact; left ear hearing aid Nose without nasal septal hypertrophy Mouth/Parynx benign; Mallinpatti scale 3 Neck: No JVD, no carotid bruits; normal carotid upstroke Lungs: clear to ausculatation and percussion; no wheezing or rales Chest wall: without tenderness to palpitation Heart: PMI not displaced, RRR in the 80s, s1 s2 normal, 1/6 systolic murmur, no diastolic murmur, no rubs, gallops, thrills, or heaves Abdomen: soft, nontender; no hepatosplenomehaly, BS+; abdominal aorta nontender and not dilated by palpation. Back: no CVA tenderness Pulses 2+ Musculoskeletal: full range of motion, normal strength, no joint deformities Extremities: no clubbing cyanosis or edema, Homan's sign negative  Neurologic: grossly nonfocal; Cranial nerves grossly wnl Psychologic: Normal mood and affect  EKG  Interpretation Date/Time:  Tuesday November 09 2022 08:10:56 EDT Ventricular Rate:  64 PR Interval:  212 QRS Duration:  164 QT Interval:  436 QTC Calculation: 449 R Axis:   -74  Text Interpretation: Sinus rhythm with 1st degree A-V block Left axis deviation Right bundle branch block When compared with ECG of 03-Mar-2021 08:55, No significant change was found  Confirmed by Nicki Guadalajara ([id number]) on 11/09/2022 8:12:09 AM    Jun 04, 2022 ECG (independently read by me): NSR at 94, RBBB, LAHB  April 12, 2022 ECG (independently read by me): NSR at 89 RBBB, LAHB   December 02, 2021 ECG (independently read by me):  NSR at 72, RBBB, LAD, QTc 479 msec  October 26, 2021 ECG (independently read by me): NSR at 77; RBBB, LAHB, LVH; QTc 484 msec   January 22, 2021 ECG (independently read by me):  NSR at 76, RBBB: PR 200 msec  July 17, 2020 ECG (independently read by me): NSR at 70; RBBB, 1st degree AV block; PR 220 msec; QTc 492 msec  March 10, 2020 ECG (independently read by me): NSR at 82; RBBB with repoarization, QTc 497 msec  November 06, 2019 ECG (independently read by me): Sinus rhythm at 64; 1st degree AV block; PR 212 msec;RBBB  July 09, 2019 ECG (independently read by me): Normal sinus rhythm at 70 bpm, first-degree AV block with a.  PR interval at 212 ms.  Right bundle branch block with repolarization changes.  Borderline LVH by voltage criteria.  QTc interval 470 ms.  April 04, 2019 ECG (independently read by me): Sinus bradycardia 59 bpm, first-degree AV block with a PR of 248 ms.  LVH.  Inferior Q waves, unchanged  January 24, 2019 ECG (independently read by me): Atrial fibrillation at 83 bpm.  Borderline LVH by voltage criteria in aVL; QTc interval 446 ms.  January 09, 2019 ECG (independently read by me): Atrial fibrillation ar 97, RBBB; Left axis deviation; Inferior Q waves, unchanged. QTc 487    August 09, 2018 ECG (independently read by me): Normal sinus rhythm at 72 bpm.  Left Axis  deviation.  QTc interval 429 ms.  September 15, 2018 ECG (independently read by me): Atrial fibrillation at 92 bpm.  Right bundle branch block with repolarization changes.  August 23, 2018 ECG (independently read by me): Atrial fibrillation at 96 bpm.  LVH with repolarization changes.  Left axis deviation.  Left anterior hemiblock.  Inferior Q waves  August 09, 2018 ECG (independently read by me):Atrial fibrillatoion at 41; LVH with repolarization  October 219 ECG (independently read by me): Normal sinus rhythm at 81 bpm.  Nonspecific intraventricular conduction delay.  Left axis deviation  September 2018 ECG (independently read by me): Normal sinus rhythm at 63 bpm.  Left bundle branch block with repolarization changes.  Normal intervals.  No ectopy.  April 2018 ECG (independently read by me): Normal sinus rhythm at 68 bpm.  LVH with repolarization.  Normal intervals.  November 2017 ECG (independently read by me): Normal sinus rhythm at 78 bpm.  Nonspecific interventricular delay.  QRS complex V1 V2.  August 2016 ECG (independently read by me): Normal sinus rhythm at 77 bpm.  LVH with repolarization changes.  Left axis deviation.  December 2015 ECG (independently read by me): Normal sinus rhythm at 87 bpm.  LVH by voltage criteria with T-wave changes in leads 1 and aVL.  July 2014 ECG: Normal sinus rhythm at 76 beats per minute. LVH with QRS widening.  LABS:     Latest Ref Rng & Units 06/29/2022    9:18 AM 05/06/2022   10:52 AM 12/24/2021    1:26 PM  BMP  Glucose 65 - 99 mg/dL 595  638  756   BUN 7 - 25 mg/dL 22  25  37   Creatinine 0.70 - 1.28 mg/dL 4.33  2.95  1.88  BUN/Creat Ratio 6 - 22 (calc) SEE NOTE:  19    Sodium 135 - 146 mmol/L 136  141  135   Potassium 3.5 - 5.3 mmol/L 4.4  4.9  4.5   Chloride 98 - 110 mmol/L 104  105  106   CO2 20 - 32 mmol/L 20  21  21    Calcium 8.6 - 10.3 mg/dL 8.6 - 93.2 mg/dL 9.9    9.9  35.5  9.7       Latest Ref Rng & Units 06/29/2022    9:18 AM  05/06/2022   10:52 AM 10/26/2021    9:53 AM  Hepatic Function  Total Protein 6.1 - 8.1 g/dL 6.7  6.5  7.3   Albumin 3.8 - 4.8 g/dL  4.5  4.4   AST 10 - 35 U/L 21  31  21    ALT 9 - 46 U/L 26  29  20    Alk Phosphatase 44 - 121 IU/L  74  63   Total Bilirubin 0.2 - 1.2 mg/dL 1.3  0.9  0.7       Latest Ref Rng & Units 05/06/2022   10:52 AM 12/24/2021    5:16 AM 12/23/2021    4:57 AM  CBC  WBC 3.4 - 10.8 x10E3/uL 5.2  6.9  8.5   Hemoglobin 13.0 - 17.7 g/dL 73.2  20.2  54.2   Hematocrit 37.5 - 51.0 % 46.8  40.7  40.6   Platelets 150 - 450 x10E3/uL 189  184  189    Lab Results  Component Value Date   MCV 91 05/06/2022   MCV 89.1 12/24/2021   MCV 89.0 12/23/2021   Lab Results  Component Value Date   TSH 0.911 05/06/2022    Lab Results  Component Value Date   HGBA1C 5.6 06/29/2022   HGBA1C 5.6 06/29/2022   HGBA1C 5.6 (A) 06/29/2022   HGBA1C 5.6 06/29/2022    Lipid Panel     Component Value Date/Time   CHOL 114 05/06/2022 1052   TRIG 52 05/06/2022 1052   HDL 52 05/06/2022 1052   CHOLHDL 2.2 05/06/2022 1052   CHOLHDL 2 08/05/2021 0819   VLDL 10.0 08/05/2021 0819   LDLCALC 50 05/06/2022 1052   LDLCALC 119 (H) 10/12/2016 0902     RADIOLOGY: No results found.  IMPRESSION:  1. Essential hypertension   2. OSA on CPAP   3. Paroxysmal atrial fibrillation: Status post ablation September 02, 2020 by Dr. Elberta Fortis   4. Mixed hyperlipidemia   5. Colonic mass: s/p resection 12/21/2021   6. Secondary hypercoagulable state [facility])     ASSESSMENT AND PLAN: Mr Chirinos is a 77 year old gentleman who has a long-standing history of hypertension and remotely had been on amlodipine/benazepril 10/40, HCTZ 25 mg, Toprol-XL 100 mg in addition to spironolactone 12.5 mg daily.  He had had an iisolated episode of atrial fibrillation in 2007 and had undergone cardioversion.  On this regimen his blood pressure had stabilized and consistently was well controlled and he had not had any recurrent episodes  of atrial fibrillation for many years.  He had gained significant amount of weight since August 2019.  He initiated a major lifestyle change and has been working closely with Dr. Dalbert Garnet from [facility] resulting in a greater than 40 pound weight loss in 2021.   When I saw him in late July 2020 he was in atrial fibrillation of questionable duration.  At the time retrospectively  he may have  been in A. fib for 3 to 4 months duration.  I gradually titrated his medical regimen and changed his benazepril to diltiazem and olmesartan to take in addition to metoprolol succinate and his spironolactone 12.5 mg daily.  He had continued to use CPAP with 100% compliance.  His only previous episode of A. fib prior to this episode had occurred in 2007 and he had maintained sinus rhythm until his recurrent episode.  He underwent successful cardioversion on September 26, 2018 with restoration of sinus rhythm.  When I saw him on January 09, 2019 he was continuing to have periods of increased stress and ECG again showed recurrent atrial fibrillation of questionable duration.  Retrospectively he felt he may have been in atrial fibrillation for 3 to 4 weeks duration prior to that visit.  He also had an episode of disorientation with possible transient expressive aphasia and underwent neurologic evaluation with Dr. Pearlean Brownie and also EP evaluation with Dr. Elberta Fortis.  He was felt that his word finding difficulty and anomia most likely represented mild cognitive impairment rather than a TIA or stroke.  He had been on increased dose of flecainide up to 100 mg twice a day after his subsequent cardioversion. An echo Doppler study in January 2021 showed an EF of 50 to 55% without regional wall motion abnormalities.  When I last saw him in February 2022 he was asymptomatic without chest pain or shortness of breath and was maintaining sinus rhythm.  He subsequently reverted back to atrial fibrillation and his flecainide dose  was discontinued and he was started on amiodarone initially 400 mg twice a day when seen by Dr. Elberta Fortis on June 30, 2020.  He had pharmacologically converted to sinus rhythm when he presented for outpatient DC cardioversion on February 14, 2020.  He underwent right successful hip replacement surgery by Dr. Charlann Boxer and also successful atrial fibrillation ablation by Dr. Elberta Fortis.  He has been maintaining sinus rhythm and is now off flecainide and amiodarone.  He had never undergone prior colonoscopy but was recently found to have a colonic mass noted on colonoscopy after a Cologuard test was positive.  Subsequent CT imaging demonstrated the ascending colonic adenocarcinoma without findings of metastatic disease in the chest, abdomen or pelvis.  He did have probable cholelithiasis.  His echo Doppler study from July 2023 showed EF at 50 to 55% (54.2% by biplane),mild LVH and mild grade 1 diastolic dysfunction.  His left atrium was moderately dilated and there was mild dilatation of his aortic root measuring 43 mm, ascending aorta at 42 mm and aortic arch at 45 mm.  At his November 2023 evaluation, he was given clearance to undergo planned surgery.  He underwent successful right hemicolectomy on December 21, 2021 by Dr. Marin Olp.  Clinically he has done remarkably well with reference to this.  He also recently had undergone biopsy and resection of small left squamous cell carcinoma of his neck.  At his follow-up visit with me in March 2024 he was no longer on olmesartan.  Blood pressure was elevated and I recommended titration of amlodipine from 5 to 10 mg, increase spironolactone to 25 mg alternating with 12.5 mg every other day and slightly increase his beta-blocker regimen to metoprolol succinate 75 mg in the morning and 50 mg at night.  Follow-up laboratory was recommended.  Metoprolol was further titrated to 100 mg in the morning and 75 mg in the evening on his Jun 04, 2018 for evaluation.  Presently, he states  his blood  pressure at home typically runs in the 120s over 70s.  He usually has blood pressure elevation in the office.  He is maintaining sinus rhythm and is unaware of recurrent atrial fibrillation.  He continues to be on atorvastatin for hyperlipidemia.  Lipid studies on May 06, 2022 were excellent with total cholesterol 114, HDL 52, LDL 50 and triglycerides 52.  Hemoglobin A1c on June 29, 2022 was 5.6 and creatinine 1.23.  He continues to use CPAP with excellent compliance.  I am making slight modification to his CPAP setting and will change him to a range of 9 to 18 cm of water.  [employer] is his DME company.  He will be seeing Dr. Elberta Fortis for follow-up evaluation in January 2025.  He continues to see Dr. Lovey Newcomer and is no longer felt to have Mnire's disease and will be scheduled to undergo an MRI to further evaluate his "heavy headedness."  I will see him in 4 to 5 months for follow-up Cardiologic evaluation or sooner as needed.   Lennette Bihari, MD, [facility]  11/14/2022 10:55 AM

## 2022-11-09 NOTE — Progress Notes (Signed)
CPAP Pressure changed from 8-20 to 9-18. New device ordered: AirSense 11. Order sent to [facility] 11/09/22

## 2022-11-09 NOTE — Patient Instructions (Signed)
Medication Instructions:  No medication changes *If you need a refill on your cardiac medications before your next appointment, please call your pharmacy*   Lab Work: No labs ordered  If you have labs (blood work) drawn today and your tests are completely normal, you will receive your results only by: MyChart Message (if you have MyChart) OR A paper copy in the mail If you have any lab test that is abnormal or we need to change your treatment, we will call you to review the results.   Testing/Procedures: No testing ordered   Follow-Up: At [facility], you and your health needs are our priority.  As part of our continuing mission to provide you with exceptional heart care, we have created designated Provider Care Teams.  These Care Teams include your primary Cardiologist (physician) and Advanced Practice Providers (APPs -  Physician Assistants and Nurse Practitioners) who all work together to provide you with the care you need, when you need it.  We recommend signing up for the patient portal called "MyChart".  Sign up information is provided on this After Visit Summary.  MyChart is used to connect with patients for Virtual Visits (Telemedicine).  Patients are able to view lab/test results, encounter notes, upcoming appointments, etc.  Non-urgent messages can be sent to your provider as well.   To learn more about what you can do with MyChart, go to [email or web address].    Your next appointment:   4 month(s)  Provider:   Nicki Guadalajara, MD     Other Instructions You will return after you have used your new c-pap machine for 90 days for sleep compliance .

## 2022-11-11 ENCOUNTER — Telehealth: Payer: Self-pay

## 2022-11-11 ENCOUNTER — Encounter: Payer: Self-pay | Admitting: Gastroenterology

## 2022-11-11 ENCOUNTER — Ambulatory Visit: Payer: Medicare PPO | Admitting: Gastroenterology

## 2022-11-11 VITALS — BP 125/75 | HR 72 | Ht 75.0 in | Wt 265.0 lb

## 2022-11-11 DIAGNOSIS — Z85038 Personal history of other malignant neoplasm of large intestine: Secondary | ICD-10-CM

## 2022-11-11 DIAGNOSIS — K625 Hemorrhage of anus and rectum: Secondary | ICD-10-CM | POA: Diagnosis not present

## 2022-11-11 DIAGNOSIS — G4733 Obstructive sleep apnea (adult) (pediatric): Secondary | ICD-10-CM | POA: Diagnosis not present

## 2022-11-11 DIAGNOSIS — Z860100 Personal history of colon polyps, unspecified: Secondary | ICD-10-CM | POA: Diagnosis not present

## 2022-11-11 DIAGNOSIS — Z7901 Long term (current) use of anticoagulants: Secondary | ICD-10-CM | POA: Diagnosis not present

## 2022-11-11 DIAGNOSIS — K648 Other hemorrhoids: Secondary | ICD-10-CM

## 2022-11-11 MED ORDER — NA SULFATE-K SULFATE-MG SULF 17.5-3.13-1.6 GM/177ML PO SOLN: 1.0000 | ORAL | 0 refills | Status: DC

## 2022-11-11 NOTE — Patient Instructions (Addendum)
_______________________________________________________  If your blood pressure at your visit was 140/90 or greater, please contact your primary care physician to follow up on this.  If you are age 76 or younger, your body mass index should be between 19-25. Your Body mass index is 33.12 kg/m. If this is out of the aformentioned range listed, please consider follow up with your Primary Care Provider.  ________________________________________________________  The [facility] providers would like to encourage you to use [facility] to communicate with providers for non-urgent requests or questions.  Due to long hold times on the telephone, sending your provider a message by [facility] may be a faster and more efficient way to get a response.  Please allow 48 business hours for a response.  Please remember that this is for non-urgent requests.  _______________________________________________________  Bonita Quin have been scheduled for a colonoscopy. Please follow written instructions given to you at your visit today.   Please pick up your prep supplies at the pharmacy within the next 1-3 days.  If you use inhalers (even only as needed), please bring them with you on the day of your procedure.  DO NOT TAKE 7 DAYS PRIOR TO TEST- Trulicity (dulaglutide) Ozempic, Wegovy (semaglutide) Mounjaro (tirzepatide) Bydureon Bcise (exanatide extended release)  DO NOT TAKE 1 DAY PRIOR TO YOUR TEST Rybelsus (semaglutide) Adlyxin (lixisenatide) Victoza (liraglutide) Byetta (exanatide) ___________________________________________________________________________  Due to recent changes in healthcare laws, you may see the results of your imaging and laboratory studies on MyChart before your provider has had a chance to review them.  We understand that in some cases there may be results that are confusing or concerning to you. Not all laboratory results come back in the same time frame and the provider may be waiting for  multiple results in order to interpret others.  Please give [name] 48 hours in order for your provider to thoroughly review all the results before contacting the office for clarification of your results.   Thank you for entrusting me with your care and choosing [facility].  Boone Master, PA-C

## 2022-11-11 NOTE — Progress Notes (Signed)
Chief Complaint: Rectal bleeding Primary GI MD: Dr. Myrtie Neither  HPI: 77 year old male history of A-fib (on Eliquis), ascending colon adenocarcinoma s/p right hemicolectomy, presents for evaluation of rectal bleeding  Patient with history of ascending colon adenocarcinoma discovered after positive Cologuard in 10/2021.  Early stage treated right hemicolectomy, no adjuvant therapy 12/2021 by Dr. Marin Olp.  He also has a history of asymptomatic colon diverticulosis and internal hemorrhoids.  Last seen February 2024 by Dr. Myrtie Neither to discuss planning for his next colonoscopy.  He was asymptomatic in regards to hemorrhoids and diverticulosis at that time.  Plan was to do surveillance colonoscopy December 2024.  Recent CMP 06/29/2022 notable for slight elevated bilirubin of 1.3, otherwise normal  Patient states he recently took an 8-hour drive to [facility] to see his children and grandchildren.  When he got there he had 1 episode of rectal bleeding.  He states the last time this happened was in 2023 right after his surgery.  He states at that time he was told to come off of his Eliquis for 2 days and do sitz bath's.  At that time his symptoms had resolved.  So this time when it occurred he did the same thing and came off of his Eliquis for 2 days and did sitz bath's and has not had a recurrence since.  Had 1 episode of rectal bleeding that was a small amount.  Denies any other symptoms.  Denies change in weight, pain, change in bowel habits.  PREVIOUS GI WORKUP   Colonoscopy 10/21/21: - The perianal and digital rectal examinations were normal.  - A sessile non-obstructing mass was found in the mid ascending colon. The mass was noncircumferential. In addition, its diameter measured approximately forty mm. Biopsies were taken with a cold forceps for histology (firm and friable tissue). Area was tattooed with an injection of 1 mL of Spot - two 0.81ml injections on opposite walls approx 5-7 cm distal  to the mass (tattoos at the hepatic flexure). There were several small polyps close to the mass that were not removed.  - Three sessile polyps were found in the distal transverse colon (1) and hepatic flexure (2). The polyps were 3 to 6 mm in size. These polyps were removed with a cold snare. Resection and retrieval were complete.  - A 12-15 mm polyp was found in the descending colon. The polyp was pedunculated. The polyp was removed with a hot snare. Resection and retrieval were complete.  - Multiple diverticula were found in the left colon.  - Internal hemorrhoids were found.     . COLON, RIGHT, RESECTION 12/21/2021: - Invasive moderately differentiated adenocarcinoma, 2.6 cm, involving ascending colon - Carcinoma invades into the submucosa - Negative for lymphovascular invasion - Resection margins are negative for carcinoma - Benign unremarkable appendix - Separate tubular adenoma without high-grade dysplasia - Separate sessile serrated polyp without cytologic dysplasia - See oncology table   ONCOLOGY TABLE:   COLON AND RECTUM, CARCINOMA:  Resection, Including Transanal Disk Excision of Rectal Neoplasms  Procedure: Resection, right colon Tumor Site: Ascending colon Tumor Size: 2.6 cm Macroscopic Tumor Perforation: Not identified Histologic Type: Adenocarcinoma Histologic Grade: G2: Moderately differentiated Multiple Primary Sites: Not applicable Tumor Extension: Carcinoma invades submucosa Lymphovascular Invasion: Not identified Perineural Invasion: Not identified Treatment Effect: No known presurgical therapy Margins:      Margin Status for Invasive Carcinoma: All margins negative for invasive carcinoma      Margin Status for Non-Invasive Tumor: All margins negative for high-grade dysplasia /  intramucosal           carcinoma and low-grade dysplasia Regional Lymph Nodes:      Number of Lymph Nodes with Tumor: 0      Number of Lymph Nodes Examined: 16 Tumor Deposits: Not  identified Distant Metastasis:      Distant Site(s) Involved: Not applicable Pathologic Stage Classification (pTNM, AJCC 8th Edition): pT1, pN0 Ancillary Studies: MMR / MSI testing will be ordered. 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 Welton Flakes; CPAP titration 11/12/04- Dr. Welton Flakes   Squamous cell carcinoma of skin of neck    right; 2023    Past Surgical History:  Procedure Laterality Date   ATRIAL FIBRILLATION ABLATION N/A 09/10/2020   Procedure: ATRIAL FIBRILLATION ABLATION;  Surgeon: Regan Lemming, MD;  Location: [facility] INVASIVE CV LAB;  Service: Cardiovascular;  Laterality: N/A;   CARDIAC CATHETERIZATION  11/03/2005   CARDIOVERSION N/A 09/26/2018   Procedure: CARDIOVERSION;  Surgeon: Sande Rives, MD;  Location: [facility] ENDOSCOPY;  Service: Endoscopy;  Laterality: N/A;   CARDIOVERSION N/A 03/13/2019   Procedure: CARDIOVERSION;  Surgeon: Chilton Si, MD;  Location: [facility] ENDOSCOPY;  Service: Cardiovascular;  Laterality: N/A;   CARDIOVERSION N/A 03/03/2021   Procedure: CARDIOVERSION;  Surgeon: Wendall Stade, MD;  Location: [facility] ENDOSCOPY;  Service: Cardiovascular;  Laterality: N/A;   CATARACT EXTRACTION  01/19/2012   KNEE ARTHROSCOPY     left knee- 2002    LAPAROSCOPIC PARTIAL RIGHT COLECTOMY Right 12/21/2021   Procedure: LAPAROSCOPIC RIGHT HEMICOLECTOMY;  Surgeon: Andria Meuse, MD;  Location: [facility] ORS;  Service: General;  Laterality: Right;   TOTAL HIP ARTHROPLASTY  06/08/2011   Procedure: TOTAL HIP ARTHROPLASTY ANTERIOR APPROACH;  Surgeon: Shelda Pal, MD;  Location: [facility] ORS;  Service: Orthopedics;  Laterality: Left;   TOTAL HIP ARTHROPLASTY Right 08/12/2020   Procedure: TOTAL HIP ARTHROPLASTY ANTERIOR APPROACH;  Surgeon: Durene Romans, MD;  Location: [facility] ORS;  Service: Orthopedics;  Laterality: Right;    Current Outpatient Medications  Medication Sig Dispense Refill   amLODipine (NORVASC) 10 MG tablet Take 1 tablet (10 mg total) by mouth daily. 90 tablet 3   Coenzyme Q10 200 MG capsule Take 200 mg by mouth at bedtime.     cyanocobalamin (VITAMIN B12) 100 MCG tablet Take 200 mcg by mouth daily. Patient takes a gummie     ELIQUIS 5 MG TABS tablet TAKE 1 TABLET BY MOUTH TWICE A DAY 180 tablet 1   ezetimibe (ZETIA) 10 MG tablet TAKE 1 TABLET (10 MG TOTAL) BY MOUTH IN THE MORNING 90 tablet 1   ferrous sulfate 325 (65 FE) MG EC tablet Take 325 mg by mouth in the morning and at bedtime.     metFORMIN (GLUCOPHAGE) 500 MG tablet TAKE 1 TABLET BY MOUTH EVERY DAY WITH BREAKFAST  90 tablet 1   metoprolol succinate (TOPROL-XL) 50 MG 24 hr tablet Take 100 mg (2 tablets) by mouth in the morning, and 75 mg (1.5 tablets) in the evening. 360 tablet 3   Multiple Vitamins-Minerals (MULTIVITAMIN WITH MINERALS) tablet Take 1 tablet by mouth daily.     Pitavastatin Calcium 2 MG TABS TAKE 1 TABLET BY MOUTH EVERY DAY IN THE EVENING 90 tablet 3   spironolactone (ALDACTONE) 25 MG tablet Take 1 tablet (25 mg total) by mouth daily. 135 tablet 1   tamsulosin (FLOMAX) 0.4 MG CAPS capsule Take 0.4 mg by mouth in the morning.     triamcinolone cream (KENALOG) 0.5 % APPLY 1 APPLICATION TOPICALLY 2 (TWO) TIMES DAILY. TO AFFECTED AREAS. 30 g 3   Zinc Sulfate (ZINC 15 PO) Take 200 mcg by mouth. Patient takes agummie     No current facility-administered medications for this visit.     Allergies as of 11/11/2022   (No Known Allergies)    Family History  Problem Relation Age of Onset   Hypertension Mother    Stroke Mother    Diabetes Mother    Hyperlipidemia Mother    Obesity Mother    Arthritis Father    Heart disease Father    Diabetes Sister    Stroke Maternal Grandmother        27 died    Heart disease Maternal Grandfather    Hypertension Maternal Grandfather    Diabetes Maternal Grandfather    Heart disease Paternal Grandfather     Social History   Socioeconomic History   Marital status: Married    Spouse name: Torion Shaak   Number of children: 2   Years of education: Not on file   Highest education level: Not on file  Occupational History   Occupation: Retired  Tobacco Use   Smoking status: Never   Smokeless tobacco: Never  Vaping Use   Vaping status: Never Used  Substance and Sexual Activity   Alcohol use: No   Drug use: No   Sexual activity: Yes    Birth control/protection: None  Other Topics Concern   Not on file  Social History Narrative   Married. Porfirio Mylar.Takes care of his mother-in-law as well.    Retired, Manufacturing engineer.    Drinks caffeine beverages. Takes a daily vitamin.   Exercises routinely.    Smoke detector in the home.    Ambulates independently.    2-sons   Social Determinants of Health   Financial Resource Strain: Low Risk  (04/14/2022)   Overall Financial Resource Strain (CARDIA)    Difficulty of Paying Living Expenses: Not hard at all  Food Insecurity: Low Risk  (10/28/2022)   Received from [facility]   Hunger Vital Sign    Worried About Running Out of Food in the Last Year: Never true    Ran Out of Food in the Last Year: Never true  Transportation Needs: No Transportation Needs (10/28/2022)   Received from [facility]    In the past 12 months, has lack of reliable transportation kept you from medical appointments, meetings, work or from getting things needed for daily living?  : No  Physical Activity: Sufficiently Active (04/14/2022)   Exercise Vital Sign    Days of Exercise per Week: 7 days    Minutes of Exercise per Session: 50 min  Stress: No Stress Concern Present (04/14/2022)   [employer] of Occupational Health - Occupational Stress Questionnaire    Feeling of  Stress : Not at all  Social Connections: Moderately Isolated (04/14/2022)   Social Connection and Isolation Panel [NHANES]    Frequency of Communication with Friends and Family: More than three times a week    Frequency of Social Gatherings with Friends and Family: More than three times a week    Attends Religious Services: Never    Database administrator or Organizations: No    Attends Banker Meetings: Never    Marital Status: Married  Catering manager Violence: Not At Risk (04/14/2022)   Humiliation, Afraid, Rape, and Kick questionnaire    Fear of Current or Ex-Partner: No    Emotionally Abused: No    Physically Abused: No    Sexually Abused: No    Review of Systems:    Constitutional: No weight loss, fever, chills, weakness or fatigue HEENT: Eyes: No change in vision               Ears, Nose, Throat:  No change in hearing or congestion Skin: No rash or itching Cardiovascular: No chest pain, chest pressure or palpitations   Respiratory: No SOB or cough Gastrointestinal: See HPI and otherwise negative Genitourinary: No dysuria or change in urinary frequency Neurological: No headache, dizziness or syncope Musculoskeletal: No new muscle or joint pain Hematologic: No bleeding or bruising Psychiatric: No history of depression or anxiety    Physical Exam:  Vital signs: There were no vitals taken for this visit.  Constitutional: NAD, Well developed, Well nourished, alert and cooperative Head:  Normocephalic and atraumatic. Eyes:   PEERL, EOMI. No icterus. Conjunctiva pink. Respiratory: Respirations even and unlabored. Lungs clear to auscultation bilaterally.   No  wheezes, crackles, or rhonchi.  Cardiovascular:  Regular rate and rhythm. No peripheral edema, cyanosis or pallor.  Gastrointestinal:  Soft, nondistended, nontender. No rebound or guarding. Normal bowel sounds. No appreciable masses or hepatomegaly. Rectal:  Not performed.  Msk:  Symmetrical without gross deformities. Without edema, no deformity or joint abnormality.  Neurologic:  Alert and  oriented x4;  grossly normal neurologically.  Skin:   Dry and intact without significant lesions or rashes. Psychiatric: Oriented to person, place and time. Demonstrates good judgement and reason without abnormal affect or behaviors.   RELEVANT LABS AND IMAGING: CBC    Component Value Date/Time   WBC 5.2 05/06/2022 1052   WBC 6.9 12/24/2021 0516   RBC 5.17 05/06/2022 1052   RBC 4.57 12/24/2021 0516   HGB 15.9 05/06/2022 1052   HCT 46.8 05/06/2022 1052   PLT 189 05/06/2022 1052   MCV 91 05/06/2022 1052   MCH 30.8 05/06/2022 1052   MCH 28.7 12/24/2021 0516   MCHC 34.0 05/06/2022 1052   MCHC 32.2 12/24/2021 0516   RDW 13.4 05/06/2022 1052   LYMPHSABS 0.7 10/26/2021 0953   MONOABS 0.5 10/26/2021 0953   EOSABS 0.0 10/26/2021 0953   BASOSABS 0.0 10/26/2021 0953    CMP     Component Value Date/Time   NA 136 06/29/2022 0918   NA 141 05/06/2022 1052   K 4.4 06/29/2022 0918   CL 104 06/29/2022 0918   CO2 20 06/29/2022 0918   GLUCOSE 129 (H) 06/29/2022 0918   BUN 22 06/29/2022 0918   BUN 25 05/06/2022 1052   CREATININE 1.23 06/29/2022 0918   CALCIUM 9.9 06/29/2022 0918   CALCIUM 9.9 06/29/2022 0918   PROT 6.7 06/29/2022 0918   PROT 6.5 05/06/2022 1052   ALBUMIN 4.5 05/06/2022 1052   AST 21 06/29/2022 1610  ALT 26 06/29/2022 0918   ALKPHOS 74 05/06/2022 1052   BILITOT 1.3 (H) 06/29/2022 0918   BILITOT 0.9 05/06/2022 1052   GFRNONAA 46 (L) 12/24/2021 1326   GFRNONAA 54 (L) 10/12/2016 0902   GFRAA 64 06/25/2019 1014   GFRAA 63 10/12/2016 0902     Assessment/Plan:   77 year old  male history of ascending colon adenocarcinoma discovered on Cologuard s/p right hemicolectomy 12/2021 with no adjuvant therapy and history of internal hemorrhoids presenting with 1 episode of rectal bleeding that resolved after stopping his Eliquis for 2 days and using sitz bath's.  History of colon cancer History of colonic polyps Due for repeat colonoscopy 12/2022.  In light of rectal bleeding (which is likely hemorrhoidal) will go ahead and schedule colonoscopy  Internal hemorrhoids Rectal bleeding 1 episode of rectal bleeding after an 8-hour drive was likely an exacerbation of his internal hemorrhoids especially since it improved after stopping Eliquis and doing sitz bath's.  However, he is due for his colonoscopy.  He is also interested in banding. - Schedule colonoscopy - Hold Eliquis for 2 days - I thoroughly discussed the procedure with the patient (at bedside) to include nature of the procedure, alternatives, benefits, and risks (including but not limited to bleeding, infection, perforation, anesthesia/cardiac pulmonary complications).  Patient verbalized understanding and gave verbal consent to proceed with procedure. - Schedule branding appointment for after colonoscopy with Dr. Myrtie Neither  Chronic use of long-term anticoagulation Obtain blood thinner cease from cardiologist to hold for 2 days  Finley Dinkel Jolee Ewing [facility] 11/11/2022, 12:37 PM  Cc: Felix Pacini A, DO

## 2022-11-11 NOTE — Telephone Encounter (Signed)
[facility] Pre-operative Risk Assessment     Request for surgical clearance:     Endoscopy Procedure  What type of surgery is being performed?     Colonoscopy  When is this surgery scheduled?     01-05-23  What type of clearance is required ?   Pharmacy  Are there any medications that need to be held prior to surgery and how long? Eliquis x 2 days  Practice name and name of physician performing surgery?      [facility]  What is your office phone and fax number?      Phone- [phone]  Fax- [phone]  Anesthesia type (None, local, MAC, general) ?       MAC

## 2022-11-12 NOTE — Progress Notes (Signed)
____________________________________________________________  Attending physician addendum:  Thank you for sending this case to me. I have reviewed the entire note and agree with the plan.   Malyssa Maris Danis, MD  ____________________________________________________________  

## 2022-11-14 ENCOUNTER — Encounter: Payer: Self-pay | Admitting: Cardiovascular Disease

## 2022-11-14 ENCOUNTER — Encounter: Payer: Self-pay | Admitting: Family Medicine

## 2022-11-15 ENCOUNTER — Encounter: Payer: Self-pay | Admitting: Family Medicine

## 2022-11-15 ENCOUNTER — Ambulatory Visit: Payer: Medicare PPO | Admitting: Family Medicine

## 2022-11-15 VITALS — BP 138/84 | HR 90 | Temp 98.2°F | Wt 274.2 lb

## 2022-11-15 DIAGNOSIS — J209 Acute bronchitis, unspecified: Secondary | ICD-10-CM

## 2022-11-15 MED ORDER — DOXYCYCLINE HYCLATE 100 MG PO TABS: 100.0000 mg | ORAL_TABLET | Freq: Two times a day (BID) | ORAL | 0 refills | Status: DC

## 2022-11-15 NOTE — Patient Instructions (Signed)

## 2022-11-15 NOTE — Telephone Encounter (Signed)
Patient advised that he has been given clearance to hold Eliquis 2 days prior to colonoscopy scheduled for 01-05-23.  Patient advised to take last dose of Eliquis on 01-02-23, and hewill be advised when to restart Eliquis by Dr Myrtie Neither after the procedure.  Patient agreed to plan and verbalized understanding.  No further questions.

## 2022-11-15 NOTE — Telephone Encounter (Signed)
Patient Name: Clayton [name]  DOB: [birth year] MRN: [id number]  Primary Cardiologist: Nicki Guadalajara, MD  Clinical pharmacists have reviewed the patient's past medical history, labs, and current medications as part of preoperative protocol coverage. The following recommendations have been made:  Per office protocol, patient can hold Eliquis for 2 days prior to procedure as requested.   I will route this recommendation to the requesting party via Epic fax function and remove from pre-op pool.  Please call with questions.  Napoleon Form, Leodis Rains, NP 11/15/2022, 9:17 AM

## 2022-11-15 NOTE — Telephone Encounter (Signed)
Patient with diagnosis of afib on Eliquis for anticoagulation.    Procedure: colonoscopy Date of procedure: 01/05/23  CHA2DS2-VASc Score = 5  This indicates a 7.2% annual risk of stroke. The patient's score is based upon: CHF History: 1 HTN History: 1 Diabetes History: 0 Stroke History: 0 Vascular Disease History: 1 Age Score: 2 Gender Score: 0   CrCl 37mL/min using adjusted body weight Platelet count 189K  Per office protocol, patient can hold Eliquis for 2 days prior to procedure as requested.    **This guidance is not considered finalized until pre-operative APP has relayed final recommendations.**

## 2022-11-15 NOTE — Progress Notes (Signed)
Clayton [name] , 09-05-1945, 77 y.o., male MRN: [id number] Patient Care Team    Relationship Specialty Notifications Start End  Natalia Leatherwood, DO PCP - General Family Medicine  06/09/15   Lennette Bihari, MD PCP - Cardiology Cardiology  10/10/18   Regan Lemming, MD PCP - Electrophysiology Cardiology Admissions 02/08/19   Lennette Bihari, MD PCP - Sleep Medicine Cardiology  04/12/22   Lennette Bihari, MD Consulting Physician Cardiology  06/09/15   Sallye Lat, MD Consulting Physician Ophthalmology  06/09/15   Elise Benne, MD Consulting Physician Ophthalmology  06/09/15   Bufford Buttner, MD Consulting Physician Dermatology  06/09/15   Ermalinda Barrios, MD Consulting Physician Otolaryngology  06/09/15   Durene Romans, MD Consulting Physician Orthopedic Surgery  06/09/15   Crist Fat, MD Attending Physician Urology  07/27/21     Chief Complaint  Patient presents with   Cough    3 weeks; cough got deeper 10-12 days ago; neg covid yesterday     Subjective: Clayton [name] is a 77 y.o. Pt presents for an OV with complaints of worsening cough  of 2 weeks duration.  Associated symptoms include wife is also ill since returning from [place]. He reports symptoms are worsening. Phlegm production is worsening. Pt has tried OTC products to ease their symptoms.      04/14/2022    7:56 AM 01/01/2022   10:25 AM 07/27/2021    9:04 AM 07/04/2020    1:32 PM 11/01/2017    8:40 AM  Depression screen PHQ 2/9  Decreased Interest 0 0 0 0 1  Down, Depressed, Hopeless 0 0 0 0 0  PHQ - 2 Score 0 0 0 0 1  Altered sleeping     3  Tired, decreased energy     1  Change in appetite     0  Feeling bad or failure about yourself      0  Trouble concentrating     0  Moving slowly or fidgety/restless     0  Suicidal thoughts     0  PHQ-9 Score     5  Difficult doing work/chores     Not difficult at all    No Known Allergies Social History   Social History Narrative   Married.  Porfirio Mylar.Takes care of his mother-in-law as well.    Retired, Manufacturing engineer.    Drinks caffeine beverages. Takes a daily vitamin.   Exercises routinely.    Smoke detector in the home.    Ambulates independently.    2-sons   Past Medical History:  Diagnosis Date   Aortic atherosclerosis (HCC) 12/21/2021   Arthritis    hips    Atrial fibrillation (HCC)    Cataract    Chronic diastolic heart failure (HCC) 12/21/2021   Cochlear Meniere syndrome of left ear 06/09/2015   CPAP (continuous positive airway pressure) dependence    Dysrhythmia    PAF- hx of 7 years ago    HLD (hyperlipidemia)    Hypertension 05/29/2010   ECHO-EF 67% wnl   Meniere disease    Obesity    OSA on CPAP 08/20/2012   Palpitations    S/P right total hip arthroplasty 08/12/2020   Sleep apnea 09/14/2004   [facility]- Dr. Welton Flakes; CPAP titration 11/12/04- Dr. Welton Flakes   Squamous cell carcinoma of skin of neck    right; 2023   Past Surgical History:  Procedure Laterality Date  ATRIAL FIBRILLATION ABLATION N/A 09/10/2020   Procedure: ATRIAL FIBRILLATION ABLATION;  Surgeon: Regan Lemming, MD;  Location: [facility] INVASIVE CV LAB;  Service: Cardiovascular;  Laterality: N/A;   CARDIAC CATHETERIZATION  11/03/2005   CARDIOVERSION N/A 09/26/2018   Procedure: CARDIOVERSION;  Surgeon: Sande Rives, MD;  Location: [facility] ENDOSCOPY;  Service: Endoscopy;  Laterality: N/A;   CARDIOVERSION N/A 03/13/2019   Procedure: CARDIOVERSION;  Surgeon: Chilton Si, MD;  Location: [facility] ENDOSCOPY;  Service: Cardiovascular;  Laterality: N/A;   CARDIOVERSION N/A 03/03/2021   Procedure: CARDIOVERSION;  Surgeon: Wendall Stade, MD;  Location: [facility] ENDOSCOPY;  Service: Cardiovascular;  Laterality: N/A;   CATARACT EXTRACTION  01/19/2012   KNEE ARTHROSCOPY     left knee- 2002    LAPAROSCOPIC PARTIAL RIGHT COLECTOMY Right 12/21/2021   Procedure: LAPAROSCOPIC RIGHT HEMICOLECTOMY;  Surgeon: Andria Meuse, MD;   Location: [facility] ORS;  Service: General;  Laterality: Right;   TOTAL HIP ARTHROPLASTY  06/08/2011   Procedure: TOTAL HIP ARTHROPLASTY ANTERIOR APPROACH;  Surgeon: Shelda Pal, MD;  Location: [facility] ORS;  Service: Orthopedics;  Laterality: Left;   TOTAL HIP ARTHROPLASTY Right 08/12/2020   Procedure: TOTAL HIP ARTHROPLASTY ANTERIOR APPROACH;  Surgeon: Durene Romans, MD;  Location: [facility] ORS;  Service: Orthopedics;  Laterality: Right;   Family History  Problem Relation Age of Onset   Hypertension Mother    Stroke Mother    Diabetes Mother    Hyperlipidemia Mother    Obesity Mother    Arthritis Father    Heart disease Father    Diabetes Sister    Stroke Maternal Grandmother        7 died    Heart disease Maternal Grandfather    Hypertension Maternal Grandfather    Diabetes Maternal Grandfather    Heart disease Paternal Grandfather    Allergies as of 11/15/2022   No Known Allergies      Medication List        Accurate as of November 15, 2022  4:01 PM. If you have any questions, ask your nurse or doctor.          amLODipine 10 MG tablet Commonly known as: NORVASC Take 1 tablet (10 mg total) by mouth daily.   Coenzyme Q10 200 MG capsule Take 200 mg by mouth at bedtime.   cyanocobalamin 100 MCG tablet Commonly known as: VITAMIN B12 Take 200 mcg by mouth daily. Patient takes a gummie   doxycycline 100 MG tablet Commonly known as: VIBRA-TABS Take 1 tablet (100 mg total) by mouth 2 (two) times daily. Started by: Felix Pacini   Eliquis 5 MG Tabs tablet Generic drug: apixaban TAKE 1 TABLET BY MOUTH TWICE A DAY   ezetimibe 10 MG tablet Commonly known as: ZETIA TAKE 1 TABLET (10 MG TOTAL) BY MOUTH IN THE MORNING   ferrous sulfate 325 (65 FE) MG EC tablet Take 325 mg by mouth in the morning and at bedtime.   metFORMIN 500 MG tablet Commonly known as: GLUCOPHAGE TAKE 1 TABLET BY MOUTH EVERY DAY WITH BREAKFAST   metoprolol succinate 50 MG 24 hr tablet Commonly known as:  TOPROL-XL Take 100 mg (2 tablets) by mouth in the morning, and 75 mg (1.5 tablets) in the evening.   multivitamin with minerals tablet Take 1 tablet by mouth daily.   Na Sulfate-K Sulfate-Mg Sulf 17.5-3.13-1.6 GM/177ML Soln Commonly known as: Suprep Bowel Prep Kit Take 1 kit by mouth as directed.   Pitavastatin Calcium 2 MG Tabs TAKE 1 TABLET BY MOUTH  EVERY DAY IN THE EVENING   spironolactone 25 MG tablet Commonly known as: ALDACTONE Take 1 tablet (25 mg total) by mouth daily.   tamsulosin 0.4 MG Caps capsule Commonly known as: FLOMAX Take 0.4 mg by mouth in the morning.   triamcinolone cream 0.5 % Commonly known as: KENALOG APPLY 1 APPLICATION TOPICALLY 2 (TWO) TIMES DAILY. TO AFFECTED AREAS.   ZINC 15 PO Take 200 mcg by mouth. Patient takes agummie        All past medical history, surgical history, allergies, family history, immunizations andmedications were updated in the EMR today and reviewed under the history and medication portions of their EMR.     Review of Systems  Constitutional:  Negative for chills, fever and malaise/fatigue.  HENT:  Positive for congestion. Negative for ear pain and sore throat.   Eyes:  Negative for pain and discharge.  Respiratory:  Positive for cough and sputum production. Negative for shortness of breath and wheezing.   Gastrointestinal:  Negative for constipation, diarrhea, nausea and vomiting.  Musculoskeletal:  Negative for myalgias.  Skin:  Negative for rash.  Neurological:  Negative for dizziness and headaches.   Negative, with the exception of above mentioned in HPI   Objective:  BP 138/84   Pulse 90   Temp 98.2 F (36.8 C)   Wt 274 lb 3.2 oz (124.4 kg)   SpO2 98%   BMI 34.27 kg/m  Body mass index is 34.27 kg/m. Physical Exam Vitals and nursing note reviewed.  Constitutional:      General: He is not in acute distress.    Appearance: Normal appearance. He is not ill-appearing, toxic-appearing or diaphoretic.  HENT:      Head: Normocephalic and atraumatic.  Eyes:     General: No scleral icterus.       Right eye: No discharge.        Left eye: No discharge.     Extraocular Movements: Extraocular movements intact.     Pupils: Pupils are equal, round, and reactive to light.  Cardiovascular:     Rate and Rhythm: Normal rate and regular rhythm.  Pulmonary:     Effort: Pulmonary effort is normal. No respiratory distress.     Breath sounds: Normal breath sounds. No wheezing, rhonchi or rales.     Comments: Cough. Winded with speaking Musculoskeletal:     Right lower leg: No edema.     Left lower leg: No edema.  Skin:    General: Skin is warm.     Findings: No rash.  Neurological:     Mental Status: He is alert and oriented to person, place, and time. Mental status is at baseline.  Psychiatric:        Mood and Affect: Mood normal.        Behavior: Behavior normal.        Thought Content: Thought content normal.        Judgment: Judgment normal.     No results found. No results found. No results found for this or any previous visit (from the past 24 hour(s)).  Assessment/Plan: DAWAUN PASE is a 77 y.o. male present for OV for  Acute bronchitis with symptoms greater than 10 days Rest, hydrate.   mucinex (DM if cough), nettie pot or nasal saline.  Doxy BID x10d prescribed, take until completed.  IM depo medrol 80 mg  If cough present it can last up to 6-8 weeks.  F/U 2 weeks if not improved.   Reviewed expectations re:  course of current medical issues. Discussed self-management of symptoms. Outlined signs and symptoms indicating need for more acute intervention. Patient verbalized understanding and all questions were answered. Patient received an After-Visit Summary.    No orders of the defined types were placed in this encounter.  Meds ordered this encounter  Medications   doxycycline (VIBRA-TABS) 100 MG tablet    Sig: Take 1 tablet (100 mg total) by mouth 2 (two) times daily.     Dispense:  20 tablet    Refill:  0   Referral Orders  No referral(s) requested today     Note is dictated utilizing voice recognition software. Although note has been proof read prior to signing, occasional typographical errors still can be missed. If any questions arise, please do not hesitate to call for verification.   electronically signed by:  Felix Pacini, DO  [facility] - OR

## 2022-11-18 ENCOUNTER — Encounter: Payer: Self-pay | Admitting: Family Medicine

## 2022-11-18 MED ORDER — BENZONATATE 200 MG PO CAPS: 200.0000 mg | ORAL_CAPSULE | Freq: Two times a day (BID) | ORAL | 0 refills | Status: DC | PRN

## 2022-11-18 NOTE — Telephone Encounter (Signed)
Please inform patient I called in Tessalon Perles to help with his cough. Over-the-counter Coricidin can also be used safely

## 2022-11-23 ENCOUNTER — Encounter: Payer: Self-pay | Admitting: Family Medicine

## 2022-11-23 ENCOUNTER — Ambulatory Visit: Payer: Medicare PPO | Admitting: Family Medicine

## 2022-11-24 NOTE — Progress Notes (Deleted)
  [name] [address] [place] [address] Phone: [phone] Subjective:    I'm seeing this patient by the request  of:  Kuneff, Renee A, DO  CC:   [id number]  Clayton [name] is a 77 y.o. male coming in with complaint of back and neck pain. OMT on 10/14/2022. Patient states   Medications patient has been prescribed:   Taking:         Reviewed prior external information including notes and imaging from previsou exam, outside providers and external EMR if available.   As well as notes that were available from care everywhere and other healthcare systems.  Past medical history, social, surgical and family history all reviewed in electronic medical record.  No pertanent information unless stated regarding to the chief complaint.   Past Medical History:  Diagnosis Date   Aortic atherosclerosis (HCC) 12/21/2021   Arthritis    hips    Atrial fibrillation (HCC)    Cataract    Chronic diastolic heart failure (HCC) 12/21/2021   Cochlear Meniere syndrome of left ear 06/09/2015   CPAP (continuous positive airway pressure) dependence    Dysrhythmia    PAF- hx of 7 years ago    HLD (hyperlipidemia)    Hypertension 05/29/2010   ECHO-EF 67% wnl   Meniere disease    Obesity    OSA on CPAP 08/20/2012   Palpitations    S/P right total hip arthroplasty 08/12/2020   Sleep apnea 09/14/2004   [facility]- Dr. Welton Flakes; CPAP titration 11/12/04- Dr. Welton Flakes   Squamous cell carcinoma of skin of neck    right; 2023    No Known Allergies   Review of Systems:  No headache, visual changes, nausea, vomiting, diarrhea, constipation, dizziness, abdominal pain, skin rash, fevers, chills, night sweats, weight loss, swollen lymph nodes, body aches, joint swelling, chest pain, shortness of breath, mood changes. POSITIVE muscle aches  Objective  There were no vitals taken for this visit.   General: No apparent distress alert and oriented  x3 mood and affect normal, dressed appropriately.  HEENT: Pupils equal, extraocular movements intact  Respiratory: Patient's speak in full sentences and does not appear short of breath  Cardiovascular: No lower extremity edema, non tender, no erythema  Gait MSK:  Back   Osteopathic findings  C2 flexed rotated and side bent right C6 flexed rotated and side bent left T3 extended rotated and side bent right inhaled rib T9 extended rotated and side bent left L2 flexed rotated and side bent right Sacrum right on right       Assessment and Plan:  No problem-specific Assessment & Plan notes found for this encounter.    Nonallopathic problems  Decision today to treat with OMT was based on Physical Exam  After verbal consent patient was treated with HVLA, ME, FPR techniques in cervical, rib, thoracic, lumbar, and sacral  areas  Patient tolerated the procedure well with improvement in symptoms  Patient given exercises, stretches and lifestyle modifications  See medications in patient instructions if given  Patient will follow up in 4-8 weeks             Note: This dictation was prepared with Dragon dictation along with smaller phrase technology. Any transcriptional errors that result from this process are unintentional.

## 2022-11-25 ENCOUNTER — Ambulatory Visit: Payer: Medicare PPO | Admitting: Family Medicine

## 2022-11-29 ENCOUNTER — Ambulatory Visit: Payer: Medicare PPO | Admitting: Family Medicine

## 2022-11-29 ENCOUNTER — Encounter: Payer: Self-pay | Admitting: Family Medicine

## 2022-11-29 VITALS — BP 138/82 | HR 90 | Temp 98.2°F | Wt 271.6 lb

## 2022-11-29 DIAGNOSIS — J209 Acute bronchitis, unspecified: Secondary | ICD-10-CM | POA: Diagnosis not present

## 2022-11-29 DIAGNOSIS — R7303 Prediabetes: Secondary | ICD-10-CM

## 2022-11-29 DIAGNOSIS — G720 Drug-induced myopathy: Secondary | ICD-10-CM | POA: Diagnosis not present

## 2022-11-29 DIAGNOSIS — T466X5A Adverse effect of antihyperlipidemic and antiarteriosclerotic drugs, initial encounter: Secondary | ICD-10-CM

## 2022-11-29 DIAGNOSIS — I700 Atherosclerosis of aorta: Secondary | ICD-10-CM

## 2022-11-29 LAB — POCT GLYCOSYLATED HEMOGLOBIN (HGB A1C)
HbA1c POC (<> result, manual entry): 5.9 % (ref 4.0–5.6)
HbA1c, POC (controlled diabetic range): 5.9 % (ref 0.0–7.0)
HbA1c, POC (prediabetic range): 5.9 % (ref 5.7–6.4)
Hemoglobin A1C: 5.9 % — AB (ref 4.0–5.6)

## 2022-11-29 MED ORDER — AZITHROMYCIN 250 MG PO TABS: ORAL_TABLET | ORAL | 0 refills | Status: AC

## 2022-11-29 MED ORDER — METFORMIN HCL 500 MG PO TABS: ORAL_TABLET | ORAL | 1 refills | Status: DC

## 2022-11-29 NOTE — Patient Instructions (Addendum)
Return in about 24 weeks (around 05/16/2023) for cpe (20 min), Routine chronic condition follow-up. Please cancel 12/3 appt.        Great to see you today.  I have refilled the medication(s) we provide.   If labs were collected or images ordered, we will inform you of  results once we have received them and reviewed. We will contact you either by echart message, or telephone call.  Please give ample time to the testing facility, and our office to run,  receive and review results. Please do not call inquiring of results, even if you can see them in your chart. We will contact you as soon as we are able. If it has been over 1 week since the test was completed, and you have not yet heard from [name], then please call us.    - echart message- for normal results that have been seen by the patient already.   - telephone call: abnormal results or if patient has not viewed results in their echart.  If a referral to a specialist was entered for you, please call us in 2 weeks if you have not heard from the specialist office to schedule.

## 2022-11-29 NOTE — Progress Notes (Signed)
Clayton [name] , 01/06/1946, 77 y.o., male MRN: [id number] Patient Care Team    Relationship Specialty Notifications Start End  Natalia Leatherwood, DO PCP - General Family Medicine  06/09/15   Lennette Bihari, MD PCP - Cardiology Cardiology  10/10/18   Regan Lemming, MD PCP - Electrophysiology Cardiology Admissions 02/08/19   Lennette Bihari, MD PCP - Sleep Medicine Cardiology  04/12/22   Lennette Bihari, MD Consulting Physician Cardiology  06/09/15   Sallye Lat, MD Consulting Physician Ophthalmology  06/09/15   Elise Benne, MD Consulting Physician Ophthalmology  06/09/15   Bufford Buttner, MD Consulting Physician Dermatology  06/09/15   Ermalinda Barrios, MD Consulting Physician Otolaryngology  06/09/15   Durene Romans, MD Consulting Physician Orthopedic Surgery  06/09/15   Crist Fat, MD Attending Physician Urology  07/27/21     Chief Complaint  Patient presents with   Prediabetes    Chronic Conditions/illness Management Follow-up on bronchitis     Subjective: Clayton [name] is a 77 y.o. Pt presents for Chronic Conditions/illness Management and f/u >Seen 2 weeks ago for persistent bronchitis infection and treated with Doxy BID x10d prescribed and   IM depo medrol 80 mg   Prior note: an OV with complaints of worsening cough  of 2 weeks duration.  Associated symptoms include wife is also ill since returning from [place]. Clayton [name] reports symptoms are worsening. Phlegm production is worsening. Pt has tried OTC products to ease their symptoms.   prediabetes.  She reports compliance with metformin 500 mg daily. Patient denies dizziness, hyperglycemic or hypoglycemic events. Patient denies numbness, tingling in the extremities or nonhealing wounds of feet.  Clayton [name] had been started on metformin by another provider. Highest A1c 6.0 in EMR. Clayton [name] is exercising to tolerance of his orthopedic conditions.  Clayton [name] is working with Dr.Beasley on weight loss . Clayton [name] is following with  cardiology routinely and they manage all cardiac meds.      04/14/2022    7:56 AM 01/01/2022   10:25 AM 07/27/2021    9:04 AM 07/04/2020    1:32 PM 11/01/2017    8:40 AM  Depression screen PHQ 2/9  Decreased Interest 0 0 0 0 1  Down, Depressed, Hopeless 0 0 0 0 0  PHQ - 2 Score 0 0 0 0 1  Altered sleeping     3  Tired, decreased energy     1  Change in appetite     0  Feeling bad or failure about yourself      0  Trouble concentrating     0  Moving slowly or fidgety/restless     0  Suicidal thoughts     0  PHQ-9 Score     5  Difficult doing work/chores     Not difficult at all    No Known Allergies Social History   Social History Narrative   Married. Clayton [name].Takes care of his mother-in-law as well.    Retired, Manufacturing engineer.    Drinks caffeine beverages. Takes a daily vitamin.   Exercises routinely.    Smoke detector in the home.    Ambulates independently.    2-sons   Past Medical History:  Diagnosis Date   Aortic atherosclerosis (HCC) 12/21/2021   Arthritis    hips    Atrial fibrillation (HCC)    Cataract    Chronic diastolic heart failure (HCC) 12/21/2021   Cochlear Meniere syndrome of left ear 06/09/2015  CPAP (continuous positive airway pressure) dependence    Dysrhythmia    PAF- hx of 7 years ago    HLD (hyperlipidemia)    Hypertension 05/29/2010   ECHO-EF 67% wnl   Meniere disease    Obesity    OSA on CPAP 08/20/2012   Palpitations    S/P right total hip arthroplasty 08/12/2020   Sleep apnea 09/14/2004   [facility]- Dr. Welton Flakes; CPAP titration 11/12/04- Dr. Welton Flakes   Squamous cell carcinoma of skin of neck    right; 2023   Past Surgical History:  Procedure Laterality Date   ATRIAL FIBRILLATION ABLATION N/A 09/10/2020   Procedure: ATRIAL FIBRILLATION ABLATION;  Surgeon: Regan Lemming, MD;  Location: [facility];  Service: Cardiovascular;  Laterality: N/A;   CARDIAC CATHETERIZATION  11/03/2005   CARDIOVERSION N/A  09/26/2018   Procedure: CARDIOVERSION;  Surgeon: Sande Rives, MD;  Location: [facility] ENDOSCOPY;  Service: Endoscopy;  Laterality: N/A;   CARDIOVERSION N/A 03/13/2019   Procedure: CARDIOVERSION;  Surgeon: Chilton Si, MD;  Location: [facility] ENDOSCOPY;  Service: Cardiovascular;  Laterality: N/A;   CARDIOVERSION N/A 03/03/2021   Procedure: CARDIOVERSION;  Surgeon: Wendall Stade, MD;  Location: [facility] ENDOSCOPY;  Service: Cardiovascular;  Laterality: N/A;   CATARACT EXTRACTION  01/19/2012   KNEE ARTHROSCOPY     left knee- 2002    LAPAROSCOPIC PARTIAL RIGHT COLECTOMY Right 12/21/2021   Procedure: LAPAROSCOPIC RIGHT HEMICOLECTOMY;  Surgeon: Andria Meuse, MD;  Location: [facility] ORS;  Service: General;  Laterality: Right;   TOTAL HIP ARTHROPLASTY  06/08/2011   Procedure: TOTAL HIP ARTHROPLASTY ANTERIOR APPROACH;  Surgeon: Shelda Pal, MD;  Location: [facility] ORS;  Service: Orthopedics;  Laterality: Left;   TOTAL HIP ARTHROPLASTY Right 08/12/2020   Procedure: TOTAL HIP ARTHROPLASTY ANTERIOR APPROACH;  Surgeon: Durene Romans, MD;  Location: [facility] ORS;  Service: Orthopedics;  Laterality: Right;   Family History  Problem Relation Age of Onset   Hypertension Mother    Stroke Mother    Diabetes Mother    Hyperlipidemia Mother    Obesity Mother    Arthritis Father    Heart disease Father    Diabetes Sister    Stroke Maternal Grandmother        66 died    Heart disease Maternal Grandfather    Hypertension Maternal Grandfather    Diabetes Maternal Grandfather    Heart disease Paternal Grandfather    Allergies as of 11/29/2022   No Known Allergies      Medication List        Accurate as of November 29, 2022  9:10 AM. If you have any questions, ask your nurse or doctor.          STOP taking these medications    benzonatate 200 MG capsule Commonly known as: TESSALON Stopped by: Felix Pacini   doxycycline 100 MG tablet Commonly known as: VIBRA-TABS Stopped by: Felix Pacini        TAKE these medications    amLODipine 10 MG tablet Commonly known as: NORVASC Take 1 tablet (10 mg total) by mouth daily.   azithromycin 250 MG tablet Commonly known as: ZITHROMAX Take 2 tablets on day 1, then 1 tablet daily on days 2 through 5 Started by: Felix Pacini   Coenzyme Q10 200 MG capsule Take 200 mg by mouth at bedtime.   cyanocobalamin 100 MCG tablet Commonly known as: VITAMIN B12 Take 200 mcg by mouth daily. Patient takes a gummie   Eliquis 5 MG  Tabs tablet Generic drug: apixaban TAKE 1 TABLET BY MOUTH TWICE A DAY   ezetimibe 10 MG tablet Commonly known as: ZETIA TAKE 1 TABLET (10 MG TOTAL) BY MOUTH IN THE MORNING   ferrous sulfate 325 (65 FE) MG EC tablet Take 325 mg by mouth in the morning and at bedtime.   metFORMIN 500 MG tablet Commonly known as: GLUCOPHAGE TAKE 1 TABLET BY MOUTH EVERY DAY WITH BREAKFAST   metoprolol succinate 50 MG 24 hr tablet Commonly known as: TOPROL-XL Take 100 mg (2 tablets) by mouth in the morning, and 75 mg (1.5 tablets) in the evening.   multivitamin with minerals tablet Take 1 tablet by mouth daily.   Na Sulfate-K Sulfate-Mg Sulf 17.5-3.13-1.6 GM/177ML Soln Commonly known as: Suprep Bowel Prep Kit Take 1 kit by mouth as directed.   Pitavastatin Calcium 2 MG Tabs TAKE 1 TABLET BY MOUTH EVERY DAY IN THE EVENING   spironolactone 25 MG tablet Commonly known as: ALDACTONE Take 1 tablet (25 mg total) by mouth daily.   tamsulosin 0.4 MG Caps capsule Commonly known as: FLOMAX Take 0.4 mg by mouth in the morning.   triamcinolone cream 0.5 % Commonly known as: KENALOG APPLY 1 APPLICATION TOPICALLY 2 (TWO) TIMES DAILY. TO AFFECTED AREAS.   ZINC 15 PO Take 200 mcg by mouth. Patient takes agummie        All past medical history, surgical history, allergies, family history, immunizations andmedications were updated in the EMR today and reviewed under the history and medication portions of their EMR.      ROS Negative, with the exception of above mentioned in HPI   Objective:  BP 138/82   Pulse 90   Temp 98.2 F (36.8 C)   Wt 271 lb 9.6 oz (123.2 kg)   SpO2 97%   BMI 33.95 kg/m  Body mass index is 33.95 kg/m. Physical Exam Vitals and nursing note reviewed.  Constitutional:      General: Clayton [name] is not in acute distress.    Appearance: Normal appearance. Clayton [name] is not ill-appearing, toxic-appearing or diaphoretic.  HENT:     Head: Normocephalic and atraumatic.     Nose: Rhinorrhea present.  Eyes:     General: No scleral icterus.       Right eye: No discharge.        Left eye: No discharge.     Extraocular Movements: Extraocular movements intact.     Pupils: Pupils are equal, round, and reactive to light.  Cardiovascular:     Rate and Rhythm: Normal rate and regular rhythm.  Pulmonary:     Effort: Pulmonary effort is normal. No respiratory distress.     Breath sounds: Normal breath sounds. No wheezing, rhonchi or rales.  Musculoskeletal:     Cervical back: Neck supple.     Right lower leg: No edema.     Left lower leg: No edema.  Skin:    General: Skin is warm.     Findings: No rash.  Neurological:     Mental Status: Clayton [name] is alert and oriented to person, place, and time. Mental status is at baseline.  Psychiatric:        Mood and Affect: Mood normal.        Behavior: Behavior normal.        Thought Content: Thought content normal.        Judgment: Judgment normal.     No results found. No results found. Results for orders placed or performed in visit on 11/29/22 (  from the past 24 hour(s))  POCT HgB A1C     Status: Abnormal   Collection Time: 11/29/22  8:52 AM  Result Value Ref Range   Hemoglobin A1C 5.9 (A) 4.0 - 5.6 %   HbA1c POC (<> result, manual entry) 5.9 4.0 - 5.6 %   HbA1c, POC (prediabetic range) 5.9 5.7 - 6.4 %   HbA1c, POC (controlled diabetic range) 5.9 0.0 - 7.0 %    Assessment/Plan: KENSON MOLA is a 77 y.o. male present for OV for   Pre-diabetes/obesity Stable Continue metformin 500 mg daily.  Discussed trying swimming for exercise, to help him get the workout Clayton [name] desires without the orthopedic strain on knees.  - POCT HgB A1C 5.6>5.9 A1c collected today  Aortic atherosclerosis-statin myopathy: Continue follow-ups with cardiology. Continue Zetia  Adenocarcinoma, colon (HCC) Continue routine follow up with gastro Clayton [name] has his colonoscopy scheduled for December 2024  Bronchitis: Start allegra  Z-pack addition.    Reviewed expectations re: course of current medical issues. Discussed self-management of symptoms. Outlined signs and symptoms indicating need for more acute intervention. Patient verbalized understanding and all questions were answered. Patient received an After-Visit Summary.    Orders Placed This Encounter  Procedures   POCT HgB A1C   Meds ordered this encounter  Medications   metFORMIN (GLUCOPHAGE) 500 MG tablet    Sig: TAKE 1 TABLET BY MOUTH EVERY DAY WITH BREAKFAST    Dispense:  90 tablet    Refill:  1   azithromycin (ZITHROMAX) 250 MG tablet    Sig: Take 2 tablets on day 1, then 1 tablet daily on days 2 through 5    Dispense:  6 tablet    Refill:  0   Referral Orders  No referral(s) requested today     Note is dictated utilizing voice recognition software. Although note has been proof read prior to signing, occasional typographical errors still can be missed. If any questions arise, please do not hesitate to call for verification.   electronically signed by:  Felix Pacini, DO  [facility] - OR

## 2022-12-06 ENCOUNTER — Ambulatory Visit: Payer: Medicare PPO | Admitting: Psychology

## 2022-12-06 DIAGNOSIS — I6781 Acute cerebrovascular insufficiency: Secondary | ICD-10-CM | POA: Diagnosis not present

## 2022-12-06 DIAGNOSIS — J3489 Other specified disorders of nose and nasal sinuses: Secondary | ICD-10-CM | POA: Diagnosis not present

## 2022-12-06 DIAGNOSIS — H90A22 Sensorineural hearing loss, unilateral, left ear, with restricted hearing on the contralateral side: Secondary | ICD-10-CM | POA: Diagnosis not present

## 2022-12-06 DIAGNOSIS — H6123 Impacted cerumen, bilateral: Secondary | ICD-10-CM | POA: Diagnosis not present

## 2022-12-06 DIAGNOSIS — H8102 Meniere's disease, left ear: Secondary | ICD-10-CM | POA: Diagnosis not present

## 2022-12-06 DIAGNOSIS — G319 Degenerative disease of nervous system, unspecified: Secondary | ICD-10-CM | POA: Diagnosis not present

## 2022-12-06 NOTE — Progress Notes (Unsigned)
[name] [address] [place] [address] Phone: [phone] Subjective:   Clayton [name], am serving as a scribe for Dr. Antoine Primas.  I'm seeing this patient by the request  of:  Kuneff, Renee A, DO  CC: Low back pain follow-up  [id number]  Clayton [name] is a 77 y.o. male coming in with complaint of back and neck pain. OMT 10/14/2022. Patient states same per usual. No new concerns.  Patient has been able to be somewhat active but did have a cold previously.  Medications patient has been prescribed: None  Taking:         Reviewed prior external information including notes and imaging from previsou exam, outside providers and external EMR if available.   As well as notes that were available from care everywhere and other healthcare systems.  Past medical history, social, surgical and family history all reviewed in electronic medical record.  No pertanent information unless stated regarding to the chief complaint.   Past Medical History:  Diagnosis Date   Aortic atherosclerosis (HCC) 12/21/2021   Arthritis    hips    Atrial fibrillation (HCC)    Cataract    Chronic diastolic heart failure (HCC) 12/21/2021   Cochlear Meniere syndrome of left ear 06/09/2015   CPAP (continuous positive airway pressure) dependence    Dysrhythmia    PAF- hx of 7 years ago    HLD (hyperlipidemia)    Hypertension 05/29/2010   ECHO-EF 67% wnl   Meniere disease    Obesity    OSA on CPAP 08/20/2012   Palpitations    S/P right total hip arthroplasty 08/12/2020   Sleep apnea 09/14/2004   [facility]- Dr. Welton Flakes; CPAP titration 11/12/04- Dr. Welton Flakes   Squamous cell carcinoma of skin of neck    right; 2023    No Known Allergies   Review of Systems:  No headache, visual changes, nausea, vomiting, diarrhea, constipation, dizziness, abdominal pain, skin rash, fevers, chills, night sweats, weight loss, swollen lymph nodes, body aches,  joint swelling, chest pain, shortness of breath, mood changes. POSITIVE muscle aches  Objective  Blood pressure 132/80, pulse 100, height 6\' 3"  (1.905 m), weight 275 lb (124.7 kg), SpO2 96%.   General: No apparent distress alert and oriented x3 mood and affect normal, dressed appropriately.  HEENT: Pupils equal, extraocular movements intact  Respiratory: Patient's speak in full sentences and does not appear short of breath  Cardiovascular: No lower extremity edema, non tender, no erythema  Back does have some loss lordosis noted.  Some tenderness to palpation in the paraspinal musculature.  Tightness with FABER test bilaterally.  Lacks last 5 degrees of extension multiple different movements.  Osteopathic findings  C2 flexed rotated and side bent right C6 flexed rotated and side bent left T3 extended rotated and side bent right inhaled rib T9 extended rotated and side bent left L2 flexed rotated and side bent right Sacrum right on right    Assessment and Plan:  Degenerative disc disease, lumbar Chronic problem with a mild exacerbation.  Likely some increasing discomfort and pain secondary to patient having recent cold and doing more coughing.  Not being quite as active.  Likely this did help some of his knee pain.  Discussed which activities to do and which ones to avoid.  Patient is adamant he is going to start more on weight loss again which she has had improvement on previously.  Follow-up with me again in 6  to 8 weeks    Nonallopathic problems  Decision today to treat with OMT was based on Physical Exam  After verbal consent patient was treated with HVLA, ME, FPR techniques in cervical, rib, thoracic, lumbar, and sacral  areas  Patient tolerated the procedure well with improvement in symptoms  Patient given exercises, stretches and lifestyle modifications  See medications in patient instructions if given  Patient will follow up in 6-8 weeks    The above documentation has  been reviewed and is accurate and complete Judi Saa, DO          Note: This dictation was prepared with Dragon dictation along with smaller phrase technology. Any transcriptional errors that result from this process are unintentional.

## 2022-12-07 ENCOUNTER — Encounter: Payer: Self-pay | Admitting: Family Medicine

## 2022-12-07 ENCOUNTER — Ambulatory Visit: Payer: Medicare PPO | Admitting: Family Medicine

## 2022-12-07 VITALS — BP 132/80 | HR 100 | Ht 75.0 in | Wt 275.0 lb

## 2022-12-07 DIAGNOSIS — M9901 Segmental and somatic dysfunction of cervical region: Secondary | ICD-10-CM | POA: Diagnosis not present

## 2022-12-07 DIAGNOSIS — M9904 Segmental and somatic dysfunction of sacral region: Secondary | ICD-10-CM | POA: Diagnosis not present

## 2022-12-07 DIAGNOSIS — M9908 Segmental and somatic dysfunction of rib cage: Secondary | ICD-10-CM

## 2022-12-07 DIAGNOSIS — M9903 Segmental and somatic dysfunction of lumbar region: Secondary | ICD-10-CM | POA: Diagnosis not present

## 2022-12-07 DIAGNOSIS — M51362 Other intervertebral disc degeneration, lumbar region with discogenic back pain and lower extremity pain: Secondary | ICD-10-CM

## 2022-12-07 DIAGNOSIS — M9902 Segmental and somatic dysfunction of thoracic region: Secondary | ICD-10-CM

## 2022-12-07 NOTE — Patient Instructions (Addendum)
Thanks for all the stories Happy holidays See you next visit

## 2022-12-07 NOTE — Assessment & Plan Note (Signed)
Chronic problem with a mild exacerbation.  Likely some increasing discomfort and pain secondary to patient having recent cold and doing more coughing.  Not being quite as active.  Likely this did help some of his knee pain.  Discussed which activities to do and which ones to avoid.  Patient is adamant he is going to start more on weight loss again which she has had improvement on previously.  Follow-up with me again in 6 to 8 weeks

## 2022-12-08 DIAGNOSIS — L821 Other seborrheic keratosis: Secondary | ICD-10-CM | POA: Diagnosis not present

## 2022-12-08 DIAGNOSIS — B078 Other viral warts: Secondary | ICD-10-CM | POA: Diagnosis not present

## 2022-12-09 DIAGNOSIS — Z961 Presence of intraocular lens: Secondary | ICD-10-CM | POA: Diagnosis not present

## 2022-12-09 DIAGNOSIS — E119 Type 2 diabetes mellitus without complications: Secondary | ICD-10-CM | POA: Diagnosis not present

## 2022-12-09 DIAGNOSIS — H35413 Lattice degeneration of retina, bilateral: Secondary | ICD-10-CM | POA: Diagnosis not present

## 2022-12-09 LAB — HM DIABETES EYE EXAM

## 2022-12-12 DIAGNOSIS — G4733 Obstructive sleep apnea (adult) (pediatric): Secondary | ICD-10-CM | POA: Diagnosis not present

## 2022-12-21 ENCOUNTER — Ambulatory Visit: Payer: Medicare PPO | Admitting: Family Medicine

## 2022-12-27 ENCOUNTER — Ambulatory Visit: Payer: Medicare PPO | Admitting: Psychology

## 2023-01-05 ENCOUNTER — Encounter: Payer: Medicare PPO | Admitting: Gastroenterology

## 2023-01-05 ENCOUNTER — Ambulatory Visit: Payer: Medicare PPO | Admitting: Gastroenterology

## 2023-01-05 ENCOUNTER — Encounter: Payer: Self-pay | Admitting: Gastroenterology

## 2023-01-05 VITALS — BP 133/87 | HR 85 | Temp 98.1°F | Resp 18 | Ht 75.0 in | Wt 265.0 lb

## 2023-01-05 DIAGNOSIS — Z85038 Personal history of other malignant neoplasm of large intestine: Secondary | ICD-10-CM | POA: Diagnosis not present

## 2023-01-05 DIAGNOSIS — Z1211 Encounter for screening for malignant neoplasm of colon: Secondary | ICD-10-CM | POA: Diagnosis not present

## 2023-01-05 DIAGNOSIS — K5730 Diverticulosis of large intestine without perforation or abscess without bleeding: Secondary | ICD-10-CM | POA: Diagnosis not present

## 2023-01-05 DIAGNOSIS — G4733 Obstructive sleep apnea (adult) (pediatric): Secondary | ICD-10-CM | POA: Diagnosis not present

## 2023-01-05 DIAGNOSIS — Z980 Intestinal bypass and anastomosis status: Secondary | ICD-10-CM | POA: Diagnosis not present

## 2023-01-05 DIAGNOSIS — K648 Other hemorrhoids: Secondary | ICD-10-CM

## 2023-01-05 DIAGNOSIS — I4891 Unspecified atrial fibrillation: Secondary | ICD-10-CM | POA: Diagnosis not present

## 2023-01-05 MED ORDER — SODIUM CHLORIDE 0.9 % IV SOLN: 500.0000 mL | INTRAVENOUS | Status: DC

## 2023-01-05 NOTE — Progress Notes (Signed)
History and Physical:  This patient presents for endoscopic testing for: Encounter Diagnosis  Name Primary?   History of colon cancer Yes    Incidentally, he had a self-limited episode of rectal bleeding in last couple months Eliquis has been held for this procedure. Patient is otherwise without complaints or active issues today.   Past Medical History: Past Medical History:  Diagnosis Date   Aortic atherosclerosis (HCC) 12/21/2021   Arthritis    hips    Atrial fibrillation (HCC)    Cataract    Chronic diastolic heart failure (HCC) 12/21/2021   Cochlear Meniere syndrome of left ear 06/09/2015   CPAP (continuous positive airway pressure) dependence    Dysrhythmia    PAF- hx of 7 years ago    HLD (hyperlipidemia)    Hypertension 05/29/2010   ECHO-EF 67% wnl   Meniere disease    Obesity    OSA on CPAP 08/20/2012   Palpitations    S/P right total hip arthroplasty 08/12/2020   Sleep apnea 09/14/2004   [facility]- Dr. Welton Flakes; CPAP titration 11/12/04- Dr. Welton Flakes   Squamous cell carcinoma of skin of neck    right; 2023     Past Surgical History: Past Surgical History:  Procedure Laterality Date   ATRIAL FIBRILLATION ABLATION N/A 09/10/2020   Procedure: ATRIAL FIBRILLATION ABLATION;  Surgeon: Regan Lemming, MD;  Location: [facility] INVASIVE CV LAB;  Service: Cardiovascular;  Laterality: N/A;   CARDIAC CATHETERIZATION  11/03/2005   CARDIOVERSION N/A 09/26/2018   Procedure: CARDIOVERSION;  Surgeon: Sande Rives, MD;  Location: [facility] ENDOSCOPY;  Service: Endoscopy;  Laterality: N/A;   CARDIOVERSION N/A 03/13/2019   Procedure: CARDIOVERSION;  Surgeon: Chilton Si, MD;  Location: [facility] ENDOSCOPY;  Service: Cardiovascular;  Laterality: N/A;   CARDIOVERSION N/A 03/03/2021   Procedure: CARDIOVERSION;  Surgeon: Wendall Stade, MD;  Location: [facility] ENDOSCOPY;  Service: Cardiovascular;  Laterality: N/A;   CATARACT EXTRACTION  01/19/2012   KNEE ARTHROSCOPY     left  knee- 2002    LAPAROSCOPIC PARTIAL RIGHT COLECTOMY Right 12/21/2021   Procedure: LAPAROSCOPIC RIGHT HEMICOLECTOMY;  Surgeon: Andria Meuse, MD;  Location: [facility] ORS;  Service: General;  Laterality: Right;   TOTAL HIP ARTHROPLASTY  06/08/2011   Procedure: TOTAL HIP ARTHROPLASTY ANTERIOR APPROACH;  Surgeon: Shelda Pal, MD;  Location: [facility] ORS;  Service: Orthopedics;  Laterality: Left;   TOTAL HIP ARTHROPLASTY Right 08/12/2020   Procedure: TOTAL HIP ARTHROPLASTY ANTERIOR APPROACH;  Surgeon: Durene Romans, MD;  Location: [facility] ORS;  Service: Orthopedics;  Laterality: Right;    Allergies: No Known Allergies  Outpatient Meds: Current Outpatient Medications  Medication Sig Dispense Refill   amLODipine (NORVASC) 10 MG tablet Take 1 tablet (10 mg total) by mouth daily. 90 tablet 3   Coenzyme Q10 200 MG capsule Take 200 mg by mouth at bedtime.     cyanocobalamin (VITAMIN B12) 100 MCG tablet Take 200 mcg by mouth daily. Patient takes a gummie     ezetimibe (ZETIA) 10 MG tablet TAKE 1 TABLET (10 MG TOTAL) BY MOUTH IN THE MORNING 90 tablet 1   metFORMIN (GLUCOPHAGE) 500 MG tablet TAKE 1 TABLET BY MOUTH EVERY DAY WITH BREAKFAST 90 tablet 1   metoprolol succinate (TOPROL-XL) 50 MG 24 hr tablet Take 100 mg (2 tablets) by mouth in the morning, and 75 mg (1.5 tablets) in the evening. 360 tablet 3   Multiple Vitamins-Minerals (MULTIVITAMIN WITH MINERALS) tablet Take 1 tablet by mouth daily.     Pitavastatin  Calcium 2 MG TABS TAKE 1 TABLET BY MOUTH EVERY DAY IN THE EVENING 90 tablet 3   spironolactone (ALDACTONE) 25 MG tablet Take 1 tablet (25 mg total) by mouth daily. 135 tablet 1   tamsulosin (FLOMAX) 0.4 MG CAPS capsule Take 0.4 mg by mouth in the morning.     Zinc Sulfate (ZINC 15 PO) Take 200 mcg by mouth. Patient takes agummie     ELIQUIS 5 MG TABS tablet TAKE 1 TABLET BY MOUTH TWICE A DAY 180 tablet 1   ferrous sulfate 325 (65 FE) MG EC tablet Take 325 mg by mouth in the morning and at bedtime.      triamcinolone cream (KENALOG) 0.5 % APPLY 1 APPLICATION TOPICALLY 2 (TWO) TIMES DAILY. TO AFFECTED AREAS. 30 g 3   Current Facility-Administered Medications  Medication Dose Route Frequency Provider Last Rate Last Admin   0.9 %  sodium chloride infusion  500 mL Intravenous Continuous Charlie Pitter III, MD          ___________________________________________________________________ Objective   Exam:  BP (!) 159/97   Pulse 95   Temp 98.1 F (36.7 C)   Ht 6\' 3"  (1.905 m)   Wt 265 lb (120.2 kg)   SpO2 95%   BMI 33.12 kg/m   CV: regular , S1/S2 Resp: clear to auscultation bilaterally, normal RR and effort noted GI: soft, no tenderness, with active bowel sounds.   Assessment: Encounter Diagnosis  Name Primary?   History of colon cancer Yes     Plan: Colonoscopy   The benefits and risks of the planned procedure were described in detail with the patient or (when appropriate) their health care proxy.  Risks were outlined as including, but not limited to, bleeding, infection, perforation, adverse medication reaction leading to cardiac or pulmonary decompensation, pancreatitis (if ERCP).  The limitation of incomplete mucosal visualization was also discussed.  No guarantees or warranties were given.  The patient is appropriate for an endoscopic procedure in the ambulatory setting.   - Amada Jupiter, MD

## 2023-01-05 NOTE — Progress Notes (Signed)
Vss nad trans to pacu 

## 2023-01-05 NOTE — Patient Instructions (Signed)
Discharge instructions given. Handouts on Diverticulosis and Hemorrhoids. Resume previous medications. YOU HAD AN ENDOSCOPIC PROCEDURE TODAY AT THE [facility]:   Refer to the procedure report that was given to you for any specific questions about what was found during the examination.  If the procedure report does not answer your questions, please call your gastroenterologist to clarify.  If you requested that your care partner not be given the details of your procedure findings, then the procedure report has been included in a sealed envelope for you to review at your convenience later.  YOU SHOULD EXPECT: Some feelings of bloating in the abdomen. Passage of more gas than usual.  Walking can help get rid of the air that was put into your GI tract during the procedure and reduce the bloating. If you had a lower endoscopy (such as a colonoscopy or flexible sigmoidoscopy) you may notice spotting of blood in your stool or on the toilet paper. If you underwent a bowel prep for your procedure, you may not have a normal bowel movement for a few days.  Please Note:  You might notice some irritation and congestion in your nose or some drainage.  This is from the oxygen used during your procedure.  There is no need for concern and it should clear up in a day or so.  SYMPTOMS TO REPORT IMMEDIATELY:  Following lower endoscopy (colonoscopy or flexible sigmoidoscopy):  Excessive amounts of blood in the stool  Significant tenderness or worsening of abdominal pains  Swelling of the abdomen that is new, acute  Fever of 100F or higher  For urgent or emergent issues, a gastroenterologist can be reached at any hour by calling [phone]. Do not use MyChart messaging for urgent concerns.    DIET:  We do recommend a small meal at first, but then you may proceed to your regular diet.  Drink plenty of fluids but you should avoid alcoholic beverages for 24 hours.  ACTIVITY:  You should plan to take  it easy for the rest of today and you should NOT DRIVE or use heavy machinery until tomorrow (because of the sedation medicines used during the test).    FOLLOW UP: Our staff will call the number listed on your records the next business day following your procedure.  We will call around 7:15- 8:00 am to check on you and address any questions or concerns that you may have regarding the information given to you following your procedure. If we do not reach you, we will leave a message.     If any biopsies were taken you will be contacted by phone or by letter within the next 1-3 weeks.  Please call us at [phone] if you have not heard about the biopsies in 3 weeks.    SIGNATURES/CONFIDENTIALITY: You and/or your care partner have signed paperwork which will be entered into your electronic medical record.  These signatures attest to the fact that that the information above on your After Visit Summary has been reviewed and is understood.  Full responsibility of the confidentiality of this discharge information lies with you and/or your care-partner.  

## 2023-01-05 NOTE — Progress Notes (Signed)
Pt's states no medical or surgical changes since previsit or office visit. 

## 2023-01-05 NOTE — Op Note (Signed)
[facility] Patient Name: Clayton [name] Procedure Date: 01/05/2023 11:34 AM MRN: [id number] Endoscopist: Sherilyn Cooter L. Myrtie Neither , MD, [id number] Age: 77 Referring MD:  Date of Birth: 30-Aug-1945 Gender: Male Account #: [id number] Procedure:                Colonoscopy Indications:              High risk colon cancer surveillance: Personal                            history of colon cancer                           Adenocarcinoma ascending colon, resected December                            2023. This is the first postsurgical surveillance                            colonoscopy. Medicines:                Monitored Anesthesia Care Procedure:                Pre-Anesthesia Assessment:                           - Prior to the procedure, a History and Physical                            was performed, and patient medications and                            allergies were reviewed. The patient's tolerance of                            previous anesthesia was also reviewed. The risks                            and benefits of the procedure and the sedation                            options and risks were discussed with the patient.                            All questions were answered, and informed consent                            was obtained. Prior Anticoagulants: The patient has                            taken Eliquis (apixaban), last dose was 3 days                            prior to procedure. ASA Grade Assessment: III - A  patient with severe systemic disease. After                            reviewing the risks and benefits, the patient was                            deemed in satisfactory condition to undergo the                            procedure.                           After obtaining informed consent, the colonoscope                            was passed under direct vision. Throughout the                            procedure, the patient's  blood pressure, pulse, and                            oxygen saturations were monitored continuously. The                            CF [id number] #[id number] was introduced through the anus                            and advanced to the the ileocolonic anastomosis.                            The colonoscopy was somewhat difficult due to a                            redundant colon. The patient tolerated the                            procedure well. The quality of the bowel                            preparation was good. The rectum and ileo-colonic                            anastomosis were photographed. Scope In: 11:44:39 AM Scope Out: 11:54:06 AM Scope Withdrawal Time: 0 hours 6 minutes 38 seconds  Total Procedure Duration: 0 hours 9 minutes 27 seconds  Findings:                 The perianal and digital rectal examinations were                            normal.                           There was evidence of a prior side-to-side  ileo-colonic anastomosis in the proximal transverse                            colon. This was patent and was characterized by                            healthy appearing mucosa and visible sutures. The                            distal aspect of theneo-terminal ileum was seen,                            but it could not be deeply intubated due to scope                            looping and postsurgical anatomy.                           Multiple diverticula were found in the left colon.                           Internal hemorrhoids were found. The hemorrhoids                            were small.                           The exam was otherwise without abnormality on                            direct and retroflexion views. Complications:            No immediate complications. Estimated Blood Loss:     Estimated blood loss: none. Impression:               - Patent side-to-side ileo-colonic anastomosis,                             characterized by healthy appearing mucosa and                            visible sutures.                           - Diverticulosis in the left colon.                           - Internal hemorrhoids.                           - The examination was otherwise normal on direct                            and retroflexion views.                           - No specimens collected.  Recommendation:           - Patient has a contact number available for                            emergencies. The signs and symptoms of potential                            delayed complications were discussed with the                            patient. Return to normal activities tomorrow.                            Written discharge instructions were provided to the                            patient.                           - Resume previous diet.                           - Resume Eliquis (apixaban) at prior dose today.                           - Repeat colonoscopy in 3 years for surveillance. Jarquis Walker L. Myrtie Neither, MD 01/05/2023 12:00:54 PM This report has been signed electronically.

## 2023-01-06 ENCOUNTER — Telehealth: Payer: Self-pay

## 2023-01-06 NOTE — Telephone Encounter (Signed)
  Follow up Call-     01/05/2023   10:51 AM 10/21/2021    1:44 PM  Call back number  Post procedure Call Back phone  # [phone]  Permission to leave phone message Yes Yes     Patient questions:  Do you have a fever, pain , or abdominal swelling? No. Pain Score  0 *  Have you tolerated food without any problems? Yes.    Have you been able to return to your normal activities? Yes.    Do you have any questions about your discharge instructions: Diet   No. Medications  No. Follow up visit  No.  Do you have questions or concerns about your Care? No.  Actions: * If pain score is 4 or above: No action needed, pain <4.

## 2023-01-11 DIAGNOSIS — G4733 Obstructive sleep apnea (adult) (pediatric): Secondary | ICD-10-CM | POA: Diagnosis not present

## 2023-01-13 DIAGNOSIS — G4733 Obstructive sleep apnea (adult) (pediatric): Secondary | ICD-10-CM | POA: Diagnosis not present

## 2023-01-27 NOTE — Progress Notes (Signed)
 [name] [address] [place] [address] Phone: [phone] Subjective:   LILLETTE Clayton [name], am serving as a scribe for Dr. Arthea Claudene.  I'm seeing this patient by the request  of:  Kuneff, Renee A, DO  CC: back and neck pain follow p   YEP:Dlagzrupcz  Clayton [name] is a 78 y.o. male coming in with complaint of back and neck pain. OMT on 12/07/2022. Patient states that he has been doing well. Has occasional muscle spasms.  Knee has been doing well.            Reviewed prior external information including notes and imaging from previsou exam, outside providers and external EMR if available.   As well as notes that were available from care everywhere and other healthcare systems.  Past medical history, social, surgical and family history all reviewed in electronic medical record.  No pertanent information unless stated regarding to the chief complaint.   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 fernand; CPAP titration 11/12/04- Dr. fernand   Squamous cell carcinoma of skin of neck    right; 2023    No Known Allergies   Review of Systems:  No headache, visual changes, nausea, vomiting, diarrhea, constipation, dizziness, abdominal pain, skin rash, fevers, chills, night sweats, weight loss, swollen lymph nodes, body aches, joint swelling, chest pain, shortness of breath, mood changes.  POSITIVE muscle aches  Objective  Blood pressure 116/78, pulse 73, height 6' 3 (1.905 m), SpO2 96%.   General: No apparent distress alert and oriented x3 mood and affect normal, dressed appropriately.  HEENT: Pupils equal, extraocular movements intact  Respiratory: Patient's speak in full sentences and does not appear short of breath  Cardiovascular: No lower extremity edema, non tender, no erythema  Gait MSK:  Back does have some loss lordosis.  Tightness noted in the paraspinal musculature.  Positive FABER test.  Osteopathic findings  C2 flexed rotated and side bent right C7 flexed rotated and side bent right T3 extended rotated and side bent right inhaled rib T9 extended rotated and side bent left L2 flexed rotated and side bent right L4 flexed rotated and side bent left Sacrum right on right       Assessment and Plan:  Degenerative disc disease, lumbar Degenerative disc disease.  No is going to be working on core strength and stability.  Discussed icing regimen and home exercises, discussed which activities to do and which ones to avoid.  Increase activity slowly.  Follow-up with me again in 6 to 8 weeks.    Nonallopathic problems  Decision today to treat with OMT was based on Physical Exam  After verbal consent patient was treated with HVLA, ME, FPR techniques in cervical, rib, thoracic, lumbar, and sacral  areas  Patient tolerated the procedure well with improvement  in symptoms  Patient given exercises, stretches and lifestyle modifications  See medications in patient instructions if given  Patient will follow up in 4-8 weeks     The above documentation has been reviewed and is accurate and complete Makhai Fulco M Rogina Schiano, DO         Note: This dictation was prepared with Dragon dictation along with smaller phrase technology. Any transcriptional errors that result from this process are unintentional.

## 2023-01-31 ENCOUNTER — Encounter: Payer: Self-pay | Admitting: Family Medicine

## 2023-01-31 ENCOUNTER — Ambulatory Visit: Payer: Medicare PPO | Admitting: Family Medicine

## 2023-01-31 VITALS — BP 116/78 | HR 73 | Ht 75.0 in

## 2023-01-31 DIAGNOSIS — M9902 Segmental and somatic dysfunction of thoracic region: Secondary | ICD-10-CM

## 2023-01-31 DIAGNOSIS — M9903 Segmental and somatic dysfunction of lumbar region: Secondary | ICD-10-CM

## 2023-01-31 DIAGNOSIS — M9904 Segmental and somatic dysfunction of sacral region: Secondary | ICD-10-CM

## 2023-01-31 DIAGNOSIS — M9908 Segmental and somatic dysfunction of rib cage: Secondary | ICD-10-CM

## 2023-01-31 DIAGNOSIS — M9901 Segmental and somatic dysfunction of cervical region: Secondary | ICD-10-CM | POA: Diagnosis not present

## 2023-01-31 DIAGNOSIS — M51362 Other intervertebral disc degeneration, lumbar region with discogenic back pain and lower extremity pain: Secondary | ICD-10-CM

## 2023-01-31 NOTE — Assessment & Plan Note (Signed)
 Degenerative disc disease.  No is going to be working on core strength and stability.  Discussed icing regimen and home exercises, discussed which activities to do and which ones to avoid.  Increase activity slowly.  Follow-up with me again in 6 to 8 weeks

## 2023-02-01 ENCOUNTER — Ambulatory Visit: Payer: Medicare PPO | Admitting: Psychology

## 2023-02-01 ENCOUNTER — Ambulatory Visit: Payer: Medicare PPO | Admitting: Cardiology

## 2023-02-01 DIAGNOSIS — F411 Generalized anxiety disorder: Secondary | ICD-10-CM

## 2023-02-01 NOTE — Progress Notes (Signed)
 [facility] Counselor/Therapist Progress Note  Patient ID: Clayton [name], MRN: [id number]    Date: 02/01/23  Time Spent: 12:03 am - 12:47 am : 44 Minutes  Treatment Type: Individual Therapy.  Reported Symptoms: anxiety.   Mental Status Exam: Appearance:  Neat and Well Groomed     Behavior: Appropriate  Motor: Normal  Speech/Language:  Clear and Coherent  Affect: Appropriate  Mood: normal  Thought process: normal  Thought content:   WNL  Sensory/Perceptual disturbances:   WNL  Orientation: oriented to person, place, time/date, and situation  Attention: Good  Concentration: Good  Memory: WNL  Fund of knowledge:  Good  Insight:   Good  Judgment:  Good  Impulse Control: Good   Risk Assessment: Danger to Self:  No Self-injurious Behavior: No Danger to Others: No Duty to Warn:no Physical Aggression / Violence:No  Access to Firearms a concern: No  Gang Involvement:No   Subjective:   Maple LITTIE Constant participated in the session, in person in the office with the therapist, and consented to treatment Dalbert reviewed the events of the past week. [name] noted receivng a clean bill of health from his recent colonoscopy. He noted doing well, health wise, for someone who is 89. He noted doing well cardiologically and receiving a clear bill of health from his cancer doctor; [name] noted having a history of skin cancer. Her noted his hope to lost 50lbs in 50 week. He noted recently being diagnosed with acute bronchitis and noted this being a setback. He noted beginning to workout on his stationary bike and is hopeful to lose weight going forward. He noted his plan to make edits to his dietary intake, as well. He has a history of participating in [facility], with Dr. Verdon, and noted being successful. He is intending to use the same eating plan as before. Therapist praised [name] for his focus on managing and improving his health. He noted reduced nighttime  anxiety due to a shorter worry list as a result of an improved bill of health. He noted experiencing anxiety during sleep but noted that this did not wake him. We processed his weight-loss goals and we worked on exploring various ways to measure progress. We worked on processing his goals, ways to stay motivated, checking-in with self, preparation, and being purposeful. He noted his intention to use an app to track his intake. He noted the adjustments he has made in his physical activity due to aging and various ailments.  We discussed ways to manage the fluctuations of weight-loss that can occur with dietary changes. Therapist praised [name] for his effort during the session and to work towards improving his health. Therapist validated [name] for his effort and energy during the session. A follow-up was scheduled for continued treatment.   Interventions: CBT   Diagnosis:   Generalized anxiety disorder  Psychiatric Treatment: No , NA  Treatment Plan:  Client Abilities/Strengths Kiel is intelligent, forthcoming, and motivated for change.   Support System: Family  Client Treatment Preferences Outpatient Therapy.   Client Statement of Needs Jhett would like to manage his anxiety, specifically morning anxiety, including engaging in more mindfulness, processing past events, manage day to day stress,  process his anxiety (specifically upon waking), process loss of first wife and the effect of this on his anxiety in relation to his current wife, Slater (labeled PTSD by [name]).   Treatment Level Weekly  Symptoms  Anxiety: anxiety, difficulty managing worry, worrying about different things.   (Status:  maintained)  Goals:   Maple experiences symptoms of Anxiety.   Treatment plan signed and available on s-drive:  No, pending patient signature.     Target Date: 06/15/23 Frequency: Weekly  Progress: 0 Modality: individual    Therapist will provide referrals for additional resources as  appropriate.  Therapist will provide psycho-education regarding [place]'s diagnosis and corresponding treatment approaches and interventions. Licensed Clinical Social Worker, Heath, LCSW will support the patient's ability to achieve the goals identified. will employ CBT, BA, Problem-solving, Solution Focused, Mindfulness,  coping skills, & other evidenced-based practices will be used to promote progress towards healthy functioning to help manage decrease symptoms associated with his diagnosis.   Reduce overall level, frequency, and intensity of the feelings of depression, anxiety  evidenced by decreased overall symptoms from 6 to 7 days/week to 0 to 1 days/week per client report for at least 3 consecutive months. Verbally express understanding of the relationship between feelings of depression, anxiety and their impact on thinking patterns and behaviors. Verbalize an understanding of the role that distorted thinking plays in creating fears, excessive worry, and ruminations.  [name] participated in the creation of the treatment plan)    [name], LCSW

## 2023-02-08 DIAGNOSIS — E119 Type 2 diabetes mellitus without complications: Secondary | ICD-10-CM | POA: Diagnosis not present

## 2023-02-08 DIAGNOSIS — I951 Orthostatic hypotension: Secondary | ICD-10-CM | POA: Diagnosis not present

## 2023-02-08 DIAGNOSIS — H5310 Unspecified subjective visual disturbances: Secondary | ICD-10-CM | POA: Diagnosis not present

## 2023-02-08 DIAGNOSIS — H35413 Lattice degeneration of retina, bilateral: Secondary | ICD-10-CM | POA: Diagnosis not present

## 2023-02-10 ENCOUNTER — Encounter: Payer: Medicare PPO | Admitting: Gastroenterology

## 2023-02-11 DIAGNOSIS — G4733 Obstructive sleep apnea (adult) (pediatric): Secondary | ICD-10-CM | POA: Diagnosis not present

## 2023-02-21 ENCOUNTER — Encounter: Payer: Self-pay | Admitting: Family Medicine

## 2023-03-09 ENCOUNTER — Encounter: Payer: Self-pay | Admitting: Cardiovascular Disease

## 2023-03-11 ENCOUNTER — Other Ambulatory Visit: Payer: Self-pay | Admitting: Cardiovascular Disease

## 2023-03-11 DIAGNOSIS — I4819 Other persistent atrial fibrillation: Secondary | ICD-10-CM

## 2023-03-11 NOTE — Progress Notes (Signed)
 [name] [address] [place] [address] Phone: [phone] Subjective:    I'm seeing this patient by the request  of:  Kuneff, Renee A, DO  CC: back and neck pain follow up   OZH:[id number]  Clayton [name] is a 78 y.o. male coming in with complaint of back and neck pain. OMT on 01/31/2023. Patient states that his L knee is sore. Wearing custom knee brace for relief. Uses TENS 4x a week.   Also c/o pain in R knee.   Medications patient has been prescribed:   Taking:         Reviewed prior external information including notes and imaging from previsou exam, outside providers and external EMR if available.   As well as notes that were available from care everywhere and other healthcare systems.  Past medical history, social, surgical and family history all reviewed in electronic medical record.  No pertanent information unless stated regarding to the chief complaint.   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 Welton Flakes; CPAP titration 11/12/04- Dr. Welton Flakes   Squamous cell carcinoma of skin of neck    right; 2023    No Known Allergies   Review of Systems:  No headache, visual changes, nausea, vomiting, diarrhea, constipation, dizziness, abdominal pain, skin rash, fevers, chills, night sweats, weight loss, swollen lymph nodes, body aches, joint swelling, chest pain, shortness of breath, mood  changes. POSITIVE muscle aches  Objective  Blood pressure 138/88, pulse (!) 114, height 6\' 3"  (1.905 m), weight 268 lb (121.6 kg), SpO2 97%.   General: No apparent distress alert and oriented x3 mood and affect normal, dressed appropriately.  HEENT: Pupils equal, extraocular movements intact  Respiratory: Patient's speak in full sentences and does not appear short of breath  Cardiovascular: No lower extremity edema, non tender, no erythema  Gait relatively normal MSK:  Back does have loss of lordosis  Tightness noted with FABER test bilaterally. Patient's overall left knee does have significant arthritic changes and does have an effusion noted.  OA stability brace noted.  Osteopathic findings  C2 flexed rotated and side bent right C7 flexed rotated and side bent left T3 extended rotated and side bent right inhaled rib T9 extended rotated and side bent left L2 flexed rotated and side bent right L4 flexed rotated and side bent left Sacrum right on right  After informed written and verbal consent, patient was seated on exam table. Left knee was prepped with alcohol swab and utilizing anterolateral approach, patient's left knee space was injected with 4:1  marcaine 0.5%: Kenalog 40mg /dL. Patient tolerated the procedure well without immediate complications.   Assessment and Plan:  Osteoarthritis of left knee Chronic problem that does seem to be doing a little better long-term with patient having the OA stability brace.  Needs to have  his Velcro replaced because of how much she does use it.  Discussed with patient that icing regimen and home exercises.  Discussed which activities to do and which ones to avoid.  Follow-up again with me in 6 to 8 weeks to see how he is doing.  Degenerative disc disease, lumbar Degenerative arthritic changes noted.  Discussed icing regimen and home exercises, which activities to do and which ones to avoid.    Nonallopathic problems  Decision today to  treat with OMT was based on Physical Exam  After verbal consent patient was treated with HVLA, ME, FPR techniques in cervical, rib, thoracic, lumbar, and sacral  areas  Patient tolerated the procedure well with improvement in symptoms  Patient given exercises, stretches and lifestyle modifications  See medications in patient instructions if given  Patient will follow up in 4-8 weeks    The above documentation has been reviewed and is accurate and complete Judi Saa, DO          Note: This dictation was prepared with Dragon dictation along with smaller phrase technology. Any transcriptional errors that result from this process are unintentional.

## 2023-03-14 ENCOUNTER — Encounter: Payer: Self-pay | Admitting: Family Medicine

## 2023-03-14 ENCOUNTER — Ambulatory Visit: Payer: Medicare PPO | Admitting: Family Medicine

## 2023-03-14 VITALS — BP 138/88 | HR 114 | Ht 75.0 in | Wt 268.0 lb

## 2023-03-14 DIAGNOSIS — M9904 Segmental and somatic dysfunction of sacral region: Secondary | ICD-10-CM | POA: Diagnosis not present

## 2023-03-14 DIAGNOSIS — M9901 Segmental and somatic dysfunction of cervical region: Secondary | ICD-10-CM | POA: Diagnosis not present

## 2023-03-14 DIAGNOSIS — M9902 Segmental and somatic dysfunction of thoracic region: Secondary | ICD-10-CM | POA: Diagnosis not present

## 2023-03-14 DIAGNOSIS — M9903 Segmental and somatic dysfunction of lumbar region: Secondary | ICD-10-CM

## 2023-03-14 DIAGNOSIS — M1712 Unilateral primary osteoarthritis, left knee: Secondary | ICD-10-CM | POA: Diagnosis not present

## 2023-03-14 DIAGNOSIS — M9908 Segmental and somatic dysfunction of rib cage: Secondary | ICD-10-CM | POA: Diagnosis not present

## 2023-03-14 DIAGNOSIS — M51362 Other intervertebral disc degeneration, lumbar region with discogenic back pain and lower extremity pain: Secondary | ICD-10-CM | POA: Diagnosis not present

## 2023-03-14 DIAGNOSIS — G4733 Obstructive sleep apnea (adult) (pediatric): Secondary | ICD-10-CM | POA: Diagnosis not present

## 2023-03-14 NOTE — Telephone Encounter (Signed)
 Prescription refill request for Eliquis received. Indication:afib Last office visit:10/24 Scr:1.23  6/24 Age: 78 Weight:120.2  kg  Prescription refilled

## 2023-03-14 NOTE — Patient Instructions (Addendum)
 Injection in knee today See me again in 6 weeks Watch other knee

## 2023-03-14 NOTE — Assessment & Plan Note (Signed)
 Chronic problem that does seem to be doing a little better long-term with patient having the OA stability brace.  Needs to have his Velcro replaced because of how much she does use it.  Discussed with patient that icing regimen and home exercises.  Discussed which activities to do and which ones to avoid.  Follow-up again with me in 6 to 8 weeks to see how he is doing.

## 2023-03-14 NOTE — Assessment & Plan Note (Signed)
 Degenerative arthritic changes noted.  Discussed icing regimen and home exercises, which activities to do and which ones to avoid.

## 2023-03-15 ENCOUNTER — Encounter: Payer: Self-pay | Admitting: Cardiovascular Disease

## 2023-03-15 ENCOUNTER — Ambulatory Visit: Payer: Medicare PPO | Attending: Cardiovascular Disease | Admitting: Cardiovascular Disease

## 2023-03-15 VITALS — BP 170/99 | HR 83 | Ht 75.0 in | Wt 268.0 lb

## 2023-03-15 DIAGNOSIS — I10 Essential (primary) hypertension: Secondary | ICD-10-CM

## 2023-03-15 DIAGNOSIS — C189 Malignant neoplasm of colon, unspecified: Secondary | ICD-10-CM | POA: Diagnosis not present

## 2023-03-15 DIAGNOSIS — D6869 Other thrombophilia: Secondary | ICD-10-CM

## 2023-03-15 DIAGNOSIS — G4733 Obstructive sleep apnea (adult) (pediatric): Secondary | ICD-10-CM

## 2023-03-15 DIAGNOSIS — E669 Obesity, unspecified: Secondary | ICD-10-CM

## 2023-03-15 DIAGNOSIS — K6389 Other specified diseases of intestine: Secondary | ICD-10-CM | POA: Diagnosis not present

## 2023-03-15 DIAGNOSIS — I480 Paroxysmal atrial fibrillation: Secondary | ICD-10-CM

## 2023-03-15 DIAGNOSIS — E782 Mixed hyperlipidemia: Secondary | ICD-10-CM

## 2023-03-15 DIAGNOSIS — Z8679 Personal history of other diseases of the circulatory system: Secondary | ICD-10-CM

## 2023-03-15 DIAGNOSIS — Z9889 Other specified postprocedural states: Secondary | ICD-10-CM

## 2023-03-15 DIAGNOSIS — I4819 Other persistent atrial fibrillation: Secondary | ICD-10-CM | POA: Diagnosis not present

## 2023-03-15 MED ORDER — METOPROLOL TARTRATE 100 MG PO TABS: ORAL_TABLET | ORAL | 3 refills | Status: DC

## 2023-03-15 MED ORDER — OLMESARTAN MEDOXOMIL 20 MG PO TABS: 20.0000 mg | ORAL_TABLET | Freq: Every day | ORAL | 3 refills | Status: DC

## 2023-03-15 NOTE — Patient Instructions (Signed)
 Medication Instructions:  Take Metoprolol Succinate 100mg  in the morning and 75mg  in the evening hours  Begin Olmesartan 20mg  daily   *If you need a refill on your cardiac medications before your next appointment, please call your pharmacy*   Lab Work: Fasting labs will be completed with Primary Care. If you have labs (blood work) drawn today and your tests are completely normal, you will receive your results only by: MyChart Message (if you have MyChart) OR A paper copy in the mail If you have any lab test that is abnormal or we need to change your treatment, we will call you to review the results.   Testing/Procedures: No procedures ordered today.    Follow-Up: At [facility], you and your health needs are our priority.  As part of our continuing mission to provide you with exceptional heart care, we have created designated Provider Care Teams.  These Care Teams include your primary Cardiologist (physician) and Advanced Practice Providers (APPs -  Physician Assistants and Nurse Practitioners) who all work together to provide you with the care you need, when you need it.  We recommend signing up for the patient portal called "MyChart".  Sign up information is provided on this After Visit Summary.  MyChart is used to connect with patients for Virtual Visits (Telemedicine).  Patients are able to view lab/test results, encounter notes, upcoming appointments, etc.  Non-urgent messages can be sent to your provider as well.   To learn more about what you can do with MyChart, go to [email or web address].    Your next appointment:   3 month(s) for sleep  Provider:   Nicki Guadalajara, MD     Other Instructions If you have any questions or concerns regarding your c-pap, bi-pap or sleep accessories, please contact Brandie Rorie at [phone].           1st Floor: - Lobby - Registration  - Pharmacy  - Lab - Cafe   2nd Floor: - PV Lab - Diagnostic Testing (echo, CT,  nuclear med)   3rd Floor: - Vacant   4th Floor: - TCTS (cardiothoracic surgery) - AFib Clinic - Structural Heart Clinic - Vascular Surgery  - Vascular Ultrasound   5th Floor: - [facility] (general and EP) - Clinical Pharmacy for coumadin, hypertension, lipid, weight-loss medications, and med management appointments      Valet parking services will be available as well.

## 2023-03-15 NOTE — Progress Notes (Unsigned)
 Patient ID: Clayton [name], male   DOB: September 16, 1945, 78 y.o.   MRN: [id number]        HPI: Clayton [name] is a 78 y.o. male who presents for a 5 month follow-up cardiology evaluation.  Clayton [name] has a history of hypertension, a remote history of PAF, status post cardioversion in 2007, obstructive sleep apnea for which he uses CPAP 100% of the time, hypertension, and hyperlipidemia. He has had weight fluctuations over the years.  In the past, he also had a history of Mnire's disease.  There was some concern by Dr. Dorma Russell that perhaps this may have been related to statin use, which led to its discontinuance.  He is not had Mnire's disease in some time.  He underwent left hip replacement surgery by Dr. Charlann Boxer and now has been able to continue to be active and exercises routinely.   His  blood pressure regimen has been amlodipine/benazepril 10/40 daily, hydrochlorothiazide has been taking 25 Mill grams in the evening and Toprol-XL 100 mg daily. He had atrial of livalo  For hyperlipidemia but developed some diarrhea secondary to this.    He has not had much success with weight loss over the past several years.    He continues to use his CPAP therapy with 100% compliance.  He will not even take a nap without his CPAP therapy and he takes his CPAP unit on alll his travels.   Since initiating CPAP therapy, he is unaware of any palpitations.  He denies any recurrent atrial fibrillation.  In the past he has had difficulty with family stress and his father had passed away, and his wife's mother recently died after having developed lung CA and had significant PVD.  He was not exercising as much as he had in the past.  He has not been successful in weight loss.  He continues to be active and still walks 3-4 miles per day.  Oftentimes in the morning when he awakens his blood pressure is elevated at 150/90 but after exercise it drops to 128/80.  He is unaware of any recurrent atrial fibrillation.  He  continues to use CPAP with 100% compliance.   He completed a two-year grieving process.  On 01/19/2016 he committed to weight loss.  On January 1, he weighed 278 pounds .  He has been walking 4 miles 5 days per week as well as some intermittent stationary bike.  He has lost a total of 32 pounds since January.  He continues to use CPAP with 100% compliance and since he has been on CPAP, he has not had any recurrent atrial fibrillation and his blood pressure has been less labile.  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  He has had failures to Crestor and Lipitor in the past due to significant myalgias, development, even with weekly dosing.  I resumed zetia   In August 2018 he noticed his heart rate increasing and he presented for evaluation and was seen by Clayton [name], PAC.  He admitted to a rare palpitation.  He denied any chest pain with exertion or dyspnea on exertion.  He has suffered a broken rib.  This year.  He has a weight goal of 220 pounds.    I saw him in September 2018, at which time he was doing well from a cardiovascular standpoint.   His father was ill and ultimately passed away in [place].  As result he was going back and forth to the [facility] area.  This resulted in a change in his diet and ability to exercise.  I last saw him in October 2019 and his weight had increased from 240 back up to 270 pounds.  He was unaware of any episodes of atrial fibrillation.  He was using CPAP with 100% compliance along with his naps.  He was committed to begin weight loss again.    Since I saw him on October 19, 2018 he has had major lifestyle change.  He has been working with Dr. Dalbert Garnet in the healthy weight and wellness group.  Peak weight was 280 and most recent weight 244.  I saw him for a cardiology evaluation on August 09, 2018.  O ver the past 4 months, he had been under increased stress as result of his wife's illness.  He has been checking his blood pressure regularly and this has  been stable but on his blood pressure recording he has noticed the message of heart rate irregularity.  He is unaware of A. fib but has noticed this message for the last 3 to 4 months.  He is exercising every day and typically walks 2 miles in the morning and in the afternoon or evening does 30 minutes of stationary bike at least 5 to 6 days/week.  He continues to use CPAP.  I obtained a download in the office today from June 22 through August 08, 2018.  He is 100% compliant.  He has a ResMed air sense 10 auto unit with a pressure range of 8-20 with 95% pressure at 10.1 and maximum average and 11.5.  AHI is excellent at 1.2.     During his August 09, 2018 encounter his ECG demonstrated that he was back in atrial fibrillation and had a ventricular rate at 89 bpm.  There was LVH with repolarization changes.  At that time, I recommended discontinuance of amlodipine/benazepril combination and in its place started Cardizem CD 180 mg rather than amlodipine and olmesartan 20 mg rather than benazepril.  I initiated anticoagulation with Eliquis 5 mg twice a day.  He underwent a 2D echo Doppler study on August 16, 2018 which showed EF low normal at 50 to 55%.  There was evidence for significant LVH and moderate dilation of his left atrium.  I saw him for follow-up evaluation on August 23, 2018.  At that time he was now cognizant of his heart rate irregularity.  He has continued to use CPAP with excellent compliance.  A new download was obtained from July 5 through August 21, 2018 which shows 100% compliance with average usage 7 hours and 28 minutes.  AHI is 1.6 and his 95th percentile auto pressure is 10.1 with a maximum average pressure of 11.4.  He states he has lost weight over the past several weeks purposefully.  He continues to exercise now on a stationary bike.  He denies any chest pain PND orthopnea.  He was tolerating Eliquis without bleeding.  During that evaluation, we discussed different options including several  additional weeks of increased weight control versus initiating antiarrhythmic therapy.  Since his only other episode of atrial fibrillation previously occurred 13 years ago and he had not had any recurrence until this year he opted for an increased rate control trial.  As result metoprolol dose was further titrated.  I saw on September 15, 2018 for follow-up evaluation.  At that time remained in atrial fibrillation despite his increase metoprolol succinate to 100 mg in the morning and 50 mg at night with continuation of  diltiazem 180 mg, spironolactone 12.5 mg daily in addition to his olmesartan 20 mg.  I scheduled him for DC cardioversion but because of the need to obtain a COVID test I was unable to schedule this to be done by me prior to going on vacation.  He ultimately underwent the successful cardioversion by Dr. Bufford Buttner successfully and received 1 shock of 200 J of biphasic synchronized rhythm with restoration of sinus rhythm.  Chemistry profile done prior to the cardioversion revealed his creatinine had increased to 1.69 and as result I recommended he reduce his olmesartan down to 10 mg from his dose of 20 mg.  I saw him on October 10, 2018 in follow-up of his cardioversion.  At that time he was maintaining sinus rhythm and feeling well.  He had more energy.  His stress level had significantly reduced since his wife had successful L5-S1 back surgery by Dr. Venetia Maxon.  He was continuing to use CPAP with 100% compliance.    I evaluated him on January 09, 2019.  At that time he stated that over the 3 months previous he had continued to feel well and remained asymptomatic.  At times there are still periods of increased stress.  He continued to use CPAP with 100% compliance.  A download was obtained from November 22 through January 08, 2019 which showed 100% compliance.  Average usage was 6 hours 47 minutes.  AHI is excellent at 1.4 with his AutoSet CPAP minimum pressure set at 8 and maximum of 20, with 95th  percentile pressure 10.2 with maximum average pressure 12.0.  During his evaluation, his ECG verify that he was back in atrial fibrillation.  At the time, he did not inform me that he had noted some irregularity but retrospectively this may have been going on for several weeks prior to that evaluation.  He was continuing to walk daily and exercising on his bike and was asymptomatic without shortness of breath.  During that evaluation I had a long discussion with him regarding possible EP evaluation for consideration of ablation.  After his significant discussion elected to initiate an attempt at antiarrhythmic therapy with low-dose flecainide initially at 50 mg twice a day with plans for office visit in 2 weeks.  He has continued to be on Eliquis for anticoagulation in addition to Zetia for his hyperlipidemia.  When I saw him on January 24, 2019 he stated that he had felt some fatigue once flecainide was instituted.  He was feeling well but this past Sunday he had been working very hard going up and down numerous steps carrying boxes with heavy exertion making at least 40 trips carrying Christmas girls back upstairs.  He was tired.  Sunday evening while sitting down he became disoriented anxious and it appeared that there was possibly some transient stress of aphasia.  He denied any focal weakness.  His symptoms ultimately resolved after 30 minutes.  He had called the office during the workweek and was concerned about his symptoms possibly secondary to flecainide.  He had self reduced his flecainide dose to just once a day rather than twice a day and is worked into my schedule today for follow-up evaluation.  Presently, he has no residual issues since that event several days ago.    At his January 2021 evaluation, I recommended he undergo a neurologic evaluation with Dr. Pearlean Brownie as well as discussed an EP evaluation for possible consideration of future atrial fibrillation ablation.  He saw Dr. Elberta Fortis on February 08, 2019  and was in persistent atrial fibrillation.  Was discussion concerning continuing flecainide with subsequent high voltage cardioversion.  He saw Dr. Pearlean Brownie February 27, 2019 and after a long evaluation it was felt that his transient episode of word finding difficulty and anomia likely represented mild cognitive impairment rather than TIA or seizure.  He subsequently saw Dr Elberta Fortis back on March 05, 2019 and on 323 he underwent successful cardioversion with Dr. Duke Salvia which required 3 shocks with restoration of sinus rhythm.  He was evaluated by me in March 2021 at which time he felt his heart rhythm had remained stable.    He was under considerable increased stress to his wife had fallen in sustained a fracture to her humerus as well as tear to her rotator cuff and biceps tendon.  Ul diltiazem was discontinued and he was started on amlodipine 5 mg and was told to continue olmesartan 20 mg in addition to his metoprolol and spironolactone.  Timately she underwent surgery.  He denies chest pain PND orthopnea.  He has continued to use CPAP download from March 05, 2019 through April 03, 2019 continues to show excellent compliance with an AHI of 2.7 and 95th percentile pressure 11.4 cm.   He was evaluated by Judy Pimple June 20, 2019.  He denies chest pain or shortness of breath.  He had noted some elevated blood pressures as well as some dyspnea on exertion.  During that evaluation his diltiazem was discontinued and he was started on amlodipine 5 mg.  He was told to continue his present dose of olmesartan, metoprolol, spironolactone.  During that evaluation he was maintaining sinus rhythm but had bradycardia and first-degree heart block.  Flecainide was reduced down to 50 mg twice a day since his symptoms of fatigability and dyspnea seem to occur at the higher dose.  With return to a lower dose his symptoms resolved.  I evaluated him in June 2021 over the prior 3 months he denied any recurrent  episodes of chest pain.    He continues to have issues in the first 10 minutes of his CPAP use which I suspect may be related to not having a ramp time on his machine which can easily be adjusted.  His blood pressure does continue to be somewhat labile but he states at home most of the time it is controlled.  He was unaware of any recurrent episodes of atrial fibrillation.  He denied presyncope or syncope.    He underwent a nuclear stress test which apparently had been ordered for dyspnea on exertion.  This was low risk study and demonstrated a hypertensive response to exercise.  A defect was felt to be present in the inferior wall which was interpreted as scar with mild peri-infarct ischemia, which may also have been exacerbated by his body habitus and diaphragmatic attenuation.  Currently he remains asymptomatic and denies chest pain or shortness of breath.  He has been evaluated by Dr. Maximino Sarin for left cochlear Mnire's disease.  He underwent audiogram which showed mild to moderate severity high-frequency sensorineural neural hearing loss in the right ear and mild to moderately severe downsloping sensorineural neural hearing loss in the left ear.   I saw him in October 2021 at which time he denied any recurrent episodes of atrial fibrillation.  He  continued to be on amlodipine 5 mg, metoprolol XL 100 mg in the evening with 25 or 50 mg in the morning, olmesartan 40 mg, and spironolactone 12.5 mg daily.  He was on  Eliquis for anticoagulation.  He wastolerating Livalo 2 mg and Zetia for hyperlipidemia with LDL cholesterol at 55 in June 2021.  He tries to play golf several times a week if possible and continues to score well.  He continues to be followed by Dr. Quillian Quince with plans for continued weight loss.  I saw him in February 2022 and since his prior evaluation he was having issues with his knees.   He had undergone removal of his meniscus.  He has been followed by Terrilee Files he recently had a  hyaluronic acid injection with benefit.  He also has undergone evaluation with Dr. Constance Goltz with discomfort in his right hip.  He has low back issues at L5-S1 and is now on gabapentin.  He is unaware of recurrent atrial fibrillation.  Apparently he is was only taking flecainide 50 mg daily instead of twice a day.  He continued to be on amlodipine 7.5 mg, Eliquis 5 mg twice a day, metoprolol succinate 150 mg in the evening and spironolactone 12.5 mg daily.  At times he he has some mild blood pressure elevation.  He continued to be evaluated at healthy weight and wellness.  Because of his knee and hip issues he has not been playing as much golf as he had in the past.  He continuesd to use CPAP with 100% compliance. A download was obtained from January 20 through March 07, 2020.  He is averaging 7 hours and 2 minutes of CPAP use per night.  AHI is 1.5 with a 95th percentile pressure at 11.5 and maximum average pressure at 13.  During that evaluation, his QTc interval was prolonged at 497 and rather than further increase flecainide I recommended titration of metoprolol to 50 mg in the morning and 100 mg at night.  He continued to be on Livalo 2 mg daily and Zetia for hyperlipidemia.  I saw him on July 17, 2020.  Since his prior evaluation he developed an episode of recurrent atrial fibrillation and was seen by Joni Reining, NP on June 27, 2020 and was scheduled to see Dr. Elberta Fortis on June 30, 2020.  During that evaluation, his flecainide was discontinued and amiodarone 400 mg twice a day was initiated for 2 weeks with plans to reduce to 400 mg for 2 weeks and then 200 mg daily.  He was scheduled to undergo DC cardioversion on July 14 2020 and upon arrival for the procedure he was found to have converted to sinus rhythm and cardioversion was not performed.  When seen by Dr. Elberta Fortis, the patient had already a scheduled right hip replacement surgery to be done by Dr. Charlann Boxer on August 12, 2020.  Dr. Elberta Fortis has scheduled  Mr. Tangeman to undergo cardioversion in late August 2022.  He was using CPAP with 100% compliance with AHI of 1.7 and 95th percentile pressure 10.7 with maximum average pressure 12.7 centimeters of water.  He underwent CT cardiac morphology/pulmonary vein prior to his ablation.  Calcium score was 42.7 Aggrastat units which is the 25th percentile for age, race and sex matched control.  He had an upper normal ascending aorta size at 3.9 cm.  He underwent successful atrial fibrillation ablation with Dr. Elberta Fortis on September 02, 2020.  He saw him in follow-up on December 16, 2020 and was maintaining sinus rhythm and was taken off flecainide.  I saw him on January 22, 2021 at which time he was maintaining sinus rhythm and continued to be on  Eliquis 5 mg twice a  day.  He has been on amlodipine 7. 5 mg daily, metoprolol succinate 50 mg twice a day, olmesartan 40 mg daily in addition to spironolactone 12.5 mg daily for hypertension.  He is on Livalo 2 mg with Zetia 10 mg for hyperlipidemia.  He continues to see Dr. Dalbert Garnet at healthy weight and wellness program.  He has resumed his activity and is walking 2 miles in the morning and riding a stationary bike for 30 minutes later in the afternoon.    He underwent an echo Doppler study on July 24, 2021 which showed EF 50 to 55% (54.2% on biplane).  He had moderate left atrial dilatation.  There was mild dilation of his aortic root at 43 mm and aortic arch at 45 mm.  He has been seen by Dr. Elberta Fortis and more recently by Maxine Glenn, PA-C.  He was noted to have very brief SVT on a long-term monitor.  He has continued to use CPAP and a download from September 7 through October 23, 2021 showed average use at 7 hours and 37 minutes per night.  AHI was excellent at 0.9 with his pressure range 6 to 8 to 20 cm of water.  He had never had a colonoscopy recently had a Cologuard test recommended by his primary physician which was positive.  He subsequently underwent colonoscopy by  Dr. Annabelle Harman and a small mass was detected in the colon with biopsy positive for malignancy.  He is undergoing further evaluation with CT imaging with plans to for surgical consultation.    I saw him on December 02, 2021. Since his prior evaluation, he underwent CT imaging and no findings of metastatic disease in the chest abdomen or pelvis was detected.  He is tentatively scheduled to undergo surgery on December 4 with Dr. Marin Olp for resection of his colonic mass.  Presently, he continues to feel well.  He denies any chest pain or shortness of breath.  He continues to be on amlodipine 5 mg, metoprolol succinate 100 mg twice a day, olmesartan 40 mg, and spironolactone 12.5 mg daily for hypertension.  He is on Eliquis anticoagulation and denies bleeding.  He is on Zetia/Livalo for hyperlipidemia.  He has been taking over-the-counter ferrous sulfate.  He continues to use CPAP with 100% compliance.  CPAP setting is 8 to 20 cm of water and AHI is excellent at 0.7 with 95th percentile pressure 10.5 with maximum average pressure 11.9 cm of water.   Since his last evaluation, he underwent successful right hemicolectomy for ascending colon adenocarcinoma on December 21, 2021 by Dr. Marin Olp.  Surgery was uncomplicated and tumor margins were negative.  He has subsequently been seen by Dr. Myrtie Neither of GI March 08, 2022 and was doing well.  He is now trying to get back in shape.  He has had some difficulty with his left knee and has been using a stationary bike rather than walking.  He underwent biopsy and resection of a skin lesion on his neck which was squamous cell CA.   I saw him on April 12, 2022.  He felt well and denied any chest pain, palpitations, or awareness of any recurrent atrial fibrillation.   He admits to some occasional ankle swelling.  He continues to use CPAP with 100% compliance and has been on CPAP for 17 years.  A download was obtained from February 24 through April 11, 2022 which  shows 100% compliance with average use at 7 hours and 15 minutes.  AHI is excellent  at 1.3.  His CPAP is set at a pressure range of 8 to 20 cm.  95th percentile pressure was 10.8 with maximum average pressure 13.0.  Presently he is on amlodipine 7.5 mg, metoprolol succinate which he takes 50 mg twice a day, spironolactone 25 mg alternating with 12.5 mg on Monday Wednesday and Friday.  He is on Zetia 10 mg and Livalo 2 mg daily.  He is on metformin 500 mg daily.  During that evaluation, he had 1+ ankle edema and his blood pressure was elevated with stage II hypertension.  I recommended further titration of amlodipine to 10 mg and increase spironolactone to 25 mg alternating with 12.5 mg every other day.  He apparently had been taken off olmesartan previous to my evaluation which I suspect was in December 2023 when his creatinine was increased at 1.62.  I recommended slight titration of metoprolol succinate from 50 mg twice a day to 75 mg in the morning and 50 mg at night and follow-up laboratory was recommended.  I last saw him on Jun 04, 2022 at which time he felt well.  He was continuing to be active and has been spreading mulch in his yard.  He typically notes his resting heart rate in the 90s.  He is unaware of any recurrent atrial fibrillation.  He continues to use CPAP at night [employer] is now his DME company.  I obtained a download in the office today which shows excellent use with average use at 7 hours and 40 minutes per night.  His CPAP is set at a pressure range of 8 to 20 cm of water and AHI is 1.4.  95th percentile pressure is 11.2 with maximum average pressure of 12.6.  His ECG showed normal sinus rhythm at 94 bpm with right bundle branch block and left anterior hemiblock.  His blood pressure continued to be elevated and I recommended titration of metoprolol succinate to 100 mg in the morning and 75 mg in the evening.  He was evaluated by Azalee Course, PA on August 04, 2022 after his blood pressure machine  the night before suggested possible irregular rhythm.  When seen by Azalee Course, he was maintaining sinus rhythm and did not have recurrent AF.  His average blood pressure at home systolic was 125 mmHg.  Since I last saw him, he has continued to feel well.  He states blood pressure at home typically is in the 120s over 70s.  He now is wearing a left hearing aid.  He does have some periods of blood pressure lability.  He has internal hemorrhoids and did have bleeding noted for short duration and he self held Eliquis for 3 days.  He is now back on Eliquis 5 mg twice a day.  He continues to be on amlodipine 10 mg, metoprolol succinate 100 mg in the morning and 75 mg in the evening in addition to spironolactone 25 mg daily.  He is on PET-avid statin 2 mg for hyperlipidemia which she tolerates well in addition with Zetia 10 mg.  He continues to use CPAP and his DME company is [employer].  I obtained a download from September 21 through November 07, 2022.  He continues to be compliant with usage averaging 7 hours and 21 minutes on days used.  His pressure set at a range of 8 to 20 cm, and his 95th percentile pressure is 11.0 with maximum average pressure 13.3.  AHI is 1.5.  He presents for evaluation.  Past Medical History:  Diagnosis Date   Aortic atherosclerosis (HCC) 12/21/2021   Arthritis    hips    Atrial fibrillation (HCC)    Cataract    Chronic diastolic heart failure (HCC) 12/21/2021   Cochlear Meniere syndrome of left ear 06/09/2015   CPAP (continuous positive airway pressure) dependence    Dysrhythmia    PAF- hx of 7 years ago    HLD (hyperlipidemia)    Hypertension 05/29/2010   ECHO-EF 67% wnl   Meniere disease    Obesity    OSA on CPAP 08/20/2012   Palpitations    S/P right total hip arthroplasty 08/12/2020   Sleep apnea 09/14/2004   [facility]- Dr. Welton Flakes; CPAP titration 11/12/04- Dr. Welton Flakes   Squamous cell carcinoma of skin of neck    right; 2023    Past Surgical History:   Procedure Laterality Date   ATRIAL FIBRILLATION ABLATION N/A 09/10/2020   Procedure: ATRIAL FIBRILLATION ABLATION;  Surgeon: Regan Lemming, MD;  Location: [facility] INVASIVE CV LAB;  Service: Cardiovascular;  Laterality: N/A;   CARDIAC CATHETERIZATION  11/03/2005   CARDIOVERSION N/A 09/26/2018   Procedure: CARDIOVERSION;  Surgeon: Sande Rives, MD;  Location: [facility] ENDOSCOPY;  Service: Endoscopy;  Laterality: N/A;   CARDIOVERSION N/A 03/13/2019   Procedure: CARDIOVERSION;  Surgeon: Chilton Si, MD;  Location: [facility] ENDOSCOPY;  Service: Cardiovascular;  Laterality: N/A;   CARDIOVERSION N/A 03/03/2021   Procedure: CARDIOVERSION;  Surgeon: Wendall Stade, MD;  Location: [facility] ENDOSCOPY;  Service: Cardiovascular;  Laterality: N/A;   CATARACT EXTRACTION  01/19/2012   KNEE ARTHROSCOPY     left knee- 2002    LAPAROSCOPIC PARTIAL RIGHT COLECTOMY Right 12/21/2021   Procedure: LAPAROSCOPIC RIGHT HEMICOLECTOMY;  Surgeon: Andria Meuse, MD;  Location: [facility] ORS;  Service: General;  Laterality: Right;   TOTAL HIP ARTHROPLASTY  06/08/2011   Procedure: TOTAL HIP ARTHROPLASTY ANTERIOR APPROACH;  Surgeon: Shelda Pal, MD;  Location: [facility] ORS;  Service: Orthopedics;  Laterality: Left;   TOTAL HIP ARTHROPLASTY Right 08/12/2020   Procedure: TOTAL HIP ARTHROPLASTY ANTERIOR APPROACH;  Surgeon: Durene Romans, MD;  Location: [facility] ORS;  Service: Orthopedics;  Laterality: Right;    No Known Allergies   Current Outpatient Medications  Medication Sig Dispense Refill   amLODipine (NORVASC) 10 MG tablet Take 1 tablet (10 mg total) by mouth daily. 90 tablet 3   Coenzyme Q10 200 MG capsule Take 200 mg by mouth at bedtime.     cyanocobalamin (VITAMIN B12) 100 MCG tablet Take 500 mcg by mouth daily. Patient takes a gummie     ELIQUIS 5 MG TABS tablet TAKE 1 TABLET BY MOUTH TWICE A DAY 180 tablet 1   ezetimibe (ZETIA) 10 MG tablet TAKE 1 TABLET (10 MG TOTAL) BY MOUTH IN THE MORNING 90 tablet 1   ferrous  sulfate 325 (65 FE) MG EC tablet Take 325 mg by mouth in the morning and at bedtime.     metFORMIN (GLUCOPHAGE) 500 MG tablet TAKE 1 TABLET BY MOUTH EVERY DAY WITH BREAKFAST 90 tablet 1   metoprolol succinate (TOPROL-XL) 50 MG 24 hr tablet Take 100 mg (2 tablets) by mouth in the morning, and 75 mg (1.5 tablets) in the evening. 360 tablet 3   Multiple Vitamins-Minerals (MULTI-VITAMIN/MINERALS PO) Take by mouth daily. Takes two in the AM     Pitavastatin Calcium 2 MG TABS TAKE 1 TABLET BY MOUTH EVERY DAY IN THE EVENING 90 tablet 3   spironolactone (ALDACTONE) 25 MG tablet Take 1 tablet (  25 mg total) by mouth daily. 135 tablet 1   tamsulosin (FLOMAX) 0.4 MG CAPS capsule Take 0.4 mg by mouth in the morning.     triamcinolone cream (KENALOG) 0.5 % APPLY 1 APPLICATION TOPICALLY 2 (TWO) TIMES DAILY. TO AFFECTED AREAS. 30 g 3   Zinc Sulfate (ZINC 15 PO) Take 30 mcg by mouth. Patient takes a gummy     amoxicillin (AMOXIL) 500 MG capsule Take 1,000 mg by mouth as needed. (Patient not taking: Reported on 03/15/2023)     No current facility-administered medications for this visit.    Socially he is remarried after his first wife passed away. He has 2 children. He is retired  and was the Charity fundraiser at [employer]. there is no tobacco history.  He plays golf at least 2 days/week and this past year not only did he have a hole in 1 but he has also shot his age.  ROS General: Negative; No fevers, chills, or night sweats;  HEENT: Negative; No changes in vision or hearing, sinus congestion, difficulty swallowing Pulmonary: Negative; No cough, wheezing, shortness of breath, hemoptysis Cardiovascular: See HPI GI: Recent colonoscopy with colonic mass; status post right hemicolectomy for ascending colon adenocarcinoma GU: Negative; No dysuria, hematuria, or difficulty voiding Musculoskeletal: He is status post rib fracture.  He is status post hip replacement.  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 Psychiatric: Negative; No behavioral problems, depression Sleep: Positive for obstructive sleep apnea on CPAP therapy with 100% compliance No snoring, daytime sleepiness, hypersomnolence, bruxism, restless legs, hypnogognic hallucinations, no cataplexy Other comprehensive 14 point system review is negative.  PE BP (!) 170/99 (BP Location: Left Arm, Patient Position: Sitting, Cuff Size: Large)   Pulse 83   Ht 6\' 3"  (1.905 m)   Wt 268 lb (121.6 kg)   SpO2 96%   BMI 33.50 kg/m    Repeat blood pressure by me was initially 162/84.  He states blood pressure at home typically is in the 120s over 70s.  Wt Readings from Last 3 Encounters:  03/15/23 268 lb (121.6 kg)  03/14/23 268 lb (121.6 kg)  01/05/23 265 lb (120.2 kg)  In 2021 he had lost approximately 40 pounds  General: Alert, oriented, no distress.  Skin: normal turgor, no rashes, warm and dry HEENT: Normocephalic, atraumatic. Pupils equal round and reactive to light; sclera anicteric; extraocular muscles intact; left ear hearing aid Nose without nasal septal hypertrophy Mouth/Parynx benign; Mallinpatti scale 3 Neck: No JVD, no carotid bruits; normal carotid upstroke Lungs: clear to ausculatation and percussion; no wheezing or rales Chest wall: without tenderness to palpitation Heart: PMI not displaced, RRR in the 80s, s1 s2 normal, 1/6 systolic murmur, no diastolic murmur, no rubs, gallops, thrills, or heaves Abdomen: soft, nontender; no hepatosplenomehaly, BS+; abdominal aorta nontender and not dilated by palpation. Back: no CVA tenderness Pulses 2+ Musculoskeletal: full range of motion, normal strength, no joint deformities Extremities: no clubbing cyanosis or edema, Homan's sign negative  Neurologic: grossly nonfocal; Cranial  nerves grossly wnl Psychologic: Normal mood and affect   EKG Interpretation Date/Time:  Tuesday March 15 2023 07:56:14 EST Ventricular Rate:  83 PR Interval:  208 QRS Duration:  164 QT Interval:  420 QTC Calculation: 493 R Axis:   -76  Text Interpretation: Normal sinus rhythm Left axis deviation Right bundle branch block When compared with ECG  of 15-Mar-2023 07:55, No significant change was found Confirmed by Nicki Guadalajara ([id number]) on 03/15/2023 8:44:51 AM   Jun 04, 2022 ECG (independently read by me): NSR at 94, RBBB, LAHB  April 12, 2022 ECG (independently read by me): NSR at 89 RBBB, LAHB   December 02, 2021 ECG (independently read by me):  NSR at 72, RBBB, LAD, QTc 479 msec  October 26, 2021 ECG (independently read by me): NSR at 77; RBBB, LAHB, LVH; QTc 484 msec   January 22, 2021 ECG (independently read by me):  NSR at 76, RBBB: PR 200 msec  July 17, 2020 ECG (independently read by me): NSR at 70; RBBB, 1st degree AV block; PR 220 msec; QTc 492 msec  March 10, 2020 ECG (independently read by me): NSR at 82; RBBB with repoarization, QTc 497 msec  November 06, 2019 ECG (independently read by me): Sinus rhythm at 64; 1st degree AV block; PR 212 msec;RBBB  July 09, 2019 ECG (independently read by me): Normal sinus rhythm at 70 bpm, first-degree AV block with a.  PR interval at 212 ms.  Right bundle branch block with repolarization changes.  Borderline LVH by voltage criteria.  QTc interval 470 ms.  April 04, 2019 ECG (independently read by me): Sinus bradycardia 59 bpm, first-degree AV block with a PR of 248 ms.  LVH.  Inferior Q waves, unchanged  January 24, 2019 ECG (independently read by me): Atrial fibrillation at 83 bpm.  Borderline LVH by voltage criteria in aVL; QTc interval 446 ms.  January 09, 2019 ECG (independently read by me): Atrial fibrillation ar 97, RBBB; Left axis deviation; Inferior Q waves, unchanged. QTc 487    August 09, 2018 ECG (independently read by me):  Normal sinus rhythm at 72 bpm.  Left Axis deviation.  QTc interval 429 ms.  September 15, 2018 ECG (independently read by me): Atrial fibrillation at 92 bpm.  Right bundle branch block with repolarization changes.  August 23, 2018 ECG (independently read by me): Atrial fibrillation at 96 bpm.  LVH with repolarization changes.  Left axis deviation.  Left anterior hemiblock.  Inferior Q waves  August 09, 2018 ECG (independently read by me):Atrial fibrillatoion at 82; LVH with repolarization  October 219 ECG (independently read by me): Normal sinus rhythm at 81 bpm.  Nonspecific intraventricular conduction delay.  Left axis deviation  September 2018 ECG (independently read by me): Normal sinus rhythm at 63 bpm.  Left bundle branch block with repolarization changes.  Normal intervals.  No ectopy.  April 2018 ECG (independently read by me): Normal sinus rhythm at 68 bpm.  LVH with repolarization.  Normal intervals.  November 2017 ECG (independently read by me): Normal sinus rhythm at 78 bpm.  Nonspecific interventricular delay.  QRS complex V1 V2.  August 2016 ECG (independently read by me): Normal sinus rhythm at 77 bpm.  LVH with repolarization changes.  Left axis deviation.  December 2015 ECG (independently read by me): Normal sinus rhythm at 87 bpm.  LVH by voltage criteria with T-wave changes in leads 1 and aVL.  July 2014 ECG: Normal sinus rhythm at 76 beats per minute. LVH with QRS widening.  LABS:     Latest Ref Rng & Units 06/29/2022    9:18 AM 05/06/2022   10:52 AM 12/24/2021    1:26 PM  BMP  Glucose 65 - 99 mg/dL 621  308  657   BUN 7 - 25 mg/dL 22  25  37   Creatinine 0.70 - 1.28  mg/dL 9.62  9.52  8.41   BUN/Creat Ratio 6 - 22 (calc) SEE NOTE:  19    Sodium 135 - 146 mmol/L 136  141  135   Potassium 3.5 - 5.3 mmol/L 4.4  4.9  4.5   Chloride 98 - 110 mmol/L 104  105  106   CO2 20 - 32 mmol/L 20  21  21    Calcium 8.6 - 10.3 mg/dL 8.6 - 32.4 mg/dL 9.9    9.9  40.1  9.7        Latest Ref Rng & Units 06/29/2022    9:18 AM 05/06/2022   10:52 AM 10/26/2021    9:53 AM  Hepatic Function  Total Protein 6.1 - 8.1 g/dL 6.7  6.5  7.3   Albumin 3.8 - 4.8 g/dL  4.5  4.4   AST 10 - 35 U/L 21  31  21    ALT 9 - 46 U/L 26  29  20    Alk Phosphatase 44 - 121 IU/L  74  63   Total Bilirubin 0.2 - 1.2 mg/dL 1.3  0.9  0.7       Latest Ref Rng & Units 05/06/2022   10:52 AM 12/24/2021    5:16 AM 12/23/2021    4:57 AM  CBC  WBC 3.4 - 10.8 x10E3/uL 5.2  6.9  8.5   Hemoglobin 13.0 - 17.7 g/dL 02.7  25.3  66.4   Hematocrit 37.5 - 51.0 % 46.8  40.7  40.6   Platelets 150 - 450 x10E3/uL 189  184  189    Lab Results  Component Value Date   MCV 91 05/06/2022   MCV 89.1 12/24/2021   MCV 89.0 12/23/2021   Lab Results  Component Value Date   TSH 0.911 05/06/2022    Lab Results  Component Value Date   HGBA1C 5.9 (A) 11/29/2022   HGBA1C 5.9 11/29/2022   HGBA1C 5.9 11/29/2022   HGBA1C 5.9 11/29/2022    Lipid Panel     Component Value Date/Time   CHOL 114 05/06/2022 1052   TRIG 52 05/06/2022 1052   HDL 52 05/06/2022 1052   CHOLHDL 2.2 05/06/2022 1052   CHOLHDL 2 08/05/2021 0819   VLDL 10.0 08/05/2021 0819   LDLCALC 50 05/06/2022 1052   LDLCALC 119 (H) 10/12/2016 0902     RADIOLOGY: No results found.  IMPRESSION:  1. Persistent atrial fibrillation (HCC)   2. Primary hypertension     ASSESSMENT AND PLAN: Mr Loewe is a 78 year old gentleman who has a long-standing history of hypertension and remotely had been on amlodipine/benazepril 10/40, HCTZ 25 mg, Toprol-XL 100 mg in addition to spironolactone 12.5 mg daily.  He had had an iisolated episode of atrial fibrillation in 2007 and had undergone cardioversion.  On this regimen his blood pressure had stabilized and consistently was well controlled and he had not had any recurrent episodes of atrial fibrillation for many years.  He had gained significant amount of weight since August 2019.  He initiated a major lifestyle  change and has been working closely with Dr. Dalbert Garnet from [facility] resulting in a greater than 40 pound weight loss in 2021.   When I saw him in late July 2020 he was in atrial fibrillation of questionable duration.  At the time retrospectively  he may have been in A. fib for 3 to 4 months duration.  I gradually titrated his medical regimen and changed his benazepril to diltiazem and olmesartan to  take in addition to metoprolol succinate and his spironolactone 12.5 mg daily.  He had continued to use CPAP with 100% compliance.  His only previous episode of A. fib prior to this episode had occurred in 2007 and he had maintained sinus rhythm until his recurrent episode.  He underwent successful cardioversion on September 26, 2018 with restoration of sinus rhythm.  When I saw him on January 09, 2019 he was continuing to have periods of increased stress and ECG again showed recurrent atrial fibrillation of questionable duration.  Retrospectively he felt he may have been in atrial fibrillation for 3 to 4 weeks duration prior to that visit.  He also had an episode of disorientation with possible transient expressive aphasia and underwent neurologic evaluation with Dr. Pearlean Brownie and also EP evaluation with Dr. Elberta Fortis.  He was felt that his word finding difficulty and anomia most likely represented mild cognitive impairment rather than a TIA or stroke.  He had been on increased dose of flecainide up to 100 mg twice a day after his subsequent cardioversion. An echo Doppler study in January 2021 showed an EF of 50 to 55% without regional wall motion abnormalities.  When I last saw him in February 2022 he was asymptomatic without chest pain or shortness of breath and was maintaining sinus rhythm.  He subsequently reverted back to atrial fibrillation and his flecainide dose was discontinued and he was started on amiodarone initially 400 mg twice a day when seen by Dr. Elberta Fortis on June 30, 2020.  He had  pharmacologically converted to sinus rhythm when he presented for outpatient DC cardioversion on February 14, 2020.  He underwent right successful hip replacement surgery by Dr. Charlann Boxer and also successful atrial fibrillation ablation by Dr. Elberta Fortis.  He has been maintaining sinus rhythm and is now off flecainide and amiodarone.  He had never undergone prior colonoscopy but was recently found to have a colonic mass noted on colonoscopy after a Cologuard test was positive.  Subsequent CT imaging demonstrated the ascending colonic adenocarcinoma without findings of metastatic disease in the chest, abdomen or pelvis.  He did have probable cholelithiasis.  His echo Doppler study from July 2023 showed EF at 50 to 55% (54.2% by biplane),mild LVH and mild grade 1 diastolic dysfunction.  His left atrium was moderately dilated and there was mild dilatation of his aortic root measuring 43 mm, ascending aorta at 42 mm and aortic arch at 45 mm.  At his November 2023 evaluation, he was given clearance to undergo planned surgery.  He underwent successful right hemicolectomy on December 21, 2021 by Dr. Marin Olp.  Clinically he has done remarkably well with reference to this.  He also recently had undergone biopsy and resection of small left squamous cell carcinoma of his neck.  At his follow-up visit with me in March 2024 he was no longer on olmesartan.  Blood pressure was elevated and I recommended titration of amlodipine from 5 to 10 mg, increase spironolactone to 25 mg alternating with 12.5 mg every other day and slightly increase his beta-blocker regimen to metoprolol succinate 75 mg in the morning and 50 mg at night.  Follow-up laboratory was recommended.  Metoprolol was further titrated to 100 mg in the morning and 75 mg in the evening on his Jun 04, 2018 for evaluation.  Presently, he states his blood pressure at home typically runs in the 120s over 70s.  He usually has blood pressure elevation in the office.  He is  maintaining sinus rhythm  and is unaware of recurrent atrial fibrillation.  He continues to be on atorvastatin for hyperlipidemia.  Lipid studies on May 06, 2022 were excellent with total cholesterol 114, HDL 52, LDL 50 and triglycerides 52.  Hemoglobin A1c on June 29, 2022 was 5.6 and creatinine 1.23.  He continues to use CPAP with excellent compliance.  I am making slight modification to his CPAP setting and will change him to a range of 9 to 18 cm of water.  [employer] is his DME company.  He will be seeing Dr. Elberta Fortis for follow-up evaluation in January 2025.  He continues to see Dr. Lovey Newcomer and is no longer felt to have Mnire's disease and will be scheduled to undergo an MRI to further evaluate his "heavy headedness."  I will see him in 4 to 5 months for follow-up Cardiologic evaluation or sooner as needed.   Lennette Bihari, MD, [facility]  03/15/2023 8:44 AM

## 2023-03-16 ENCOUNTER — Encounter: Payer: Self-pay | Admitting: Cardiovascular Disease

## 2023-03-16 NOTE — Telephone Encounter (Signed)
 Called and spoke to pt.  Apologized for the communication error. After speaking to Dr. Tresa Endo and Crista Elliot, CMA it was verified that we mistakenly prescribed Metoprolol Tartrate instead of Succinate.  RX was canceled; [facility] notified.  Pt verbalized understanding.  He does not need a RX sent for Metoprolol Succinate, as he has plenty at home.

## 2023-03-16 NOTE — Telephone Encounter (Signed)
Yes and thank you so much!

## 2023-03-17 ENCOUNTER — Encounter: Payer: Self-pay | Admitting: Cardiovascular Disease

## 2023-03-18 ENCOUNTER — Other Ambulatory Visit: Payer: Self-pay | Admitting: Cardiovascular Disease

## 2023-04-04 ENCOUNTER — Ambulatory Visit (INDEPENDENT_AMBULATORY_CARE_PROVIDER_SITE_OTHER): Payer: Medicare PPO | Admitting: Psychology

## 2023-04-04 DIAGNOSIS — F411 Generalized anxiety disorder: Secondary | ICD-10-CM | POA: Diagnosis not present

## 2023-04-04 NOTE — Progress Notes (Signed)
 [facility] Counselor/Therapist Progress Note  Patient ID: Clayton [name], MRN: [id number]    Date: 04/04/23  Time Spent: 10:03 am - 10:52 am : 49 Minutes  Treatment Type: Individual Therapy.  Reported Symptoms: anxiety.   Mental Status Exam: Appearance:  Neat and Well Groomed     Behavior: Appropriate  Motor: Normal  Speech/Language:  Clear and Coherent  Affect: Appropriate  Mood: normal  Thought process: normal  Thought content:   WNL  Sensory/Perceptual disturbances:   WNL  Orientation: oriented to person, place, time/date, and situation  Attention: Good  Concentration: Good  Memory: WNL  Fund of knowledge:  Good  Insight:   Good  Judgment:  Good  Impulse Control: Good   Risk Assessment: Danger to Self:  No Self-injurious Behavior: No Danger to Others: No Duty to Warn:no Physical Aggression / Violence:No  Access to Firearms a concern: No  Gang Involvement:No   Subjective:   Clayton [name] participated in the session, in person in the office with the therapist, and consented to treatment Clayton [name] reviewed the events of the past week. Clayton [name] noted a recent trip and noted getting restful sleep without any anxiety or tension upon waking up. He noted processing this between session and noted that he identified a lack of responsibility and tasks. He noted the drive to complete to do specific tasks, "owing it to the world", and not being as "sharp" as he was before as contributing factors to his anxiety. He noted his responsibility including managing the home, managing the finances, ability to send grand children to college. We worked on exploring this during the session. He noted often various goals do not come to fruition in a way that meets expectations. We discussed whether his expectations are reasonable and attainable. We worked on identifying ways to set more reasonable expectations. Therapist discussed the use of a notebook, to write his due tasks and allow  for flexibility and reasonable expectations. Clayton [name] was open to this but noted his concern that this might result in his day being more rigid than flexible. We worked on exploring this during the session. He noted having less financial stressors due to  planning, preparation, and consistency. Therapist highlighted this being a possible tool, in relation to positive self-talk, and to work on identifying ways to apply these tools to other areas of his life. Clayton [name] was engaged and motivated during the session. He expressed commitment towards goals. Therapist validated Clayton [name]'s feelings and experience and provided supportive therapy.   Interventions: CBT   Diagnosis:   Generalized anxiety disorder  Psychiatric Treatment: No , NA  Treatment Plan:  Client Abilities/Strengths Clayton [name] is intelligent, forthcoming, and motivated for change.   Support System: Family  Client Treatment Preferences Outpatient Therapy.   Client Statement of Needs Clayton [name] would like to manage his anxiety, specifically morning anxiety, including engaging in more mindfulness, processing past events, manage day to day stress,  process his anxiety (specifically upon waking), process loss of first wife and the effect of this on his anxiety in relation to his current wife, [name] (labeled PTSD by Clayton [name]).   Treatment Level Weekly  Symptoms  Anxiety: anxiety, difficulty managing worry, worrying about different things.   (Status: maintained)  Goals:   Clayton [name] experiences symptoms of Anxiety.   Treatment plan signed and available on s-drive:  No, pending patient signature.     Target Date: 06/15/23 Frequency: Weekly  Progress: 0 Modality: individual    Therapist will provide referrals for additional resources as  appropriate.  Therapist will provide psycho-education regarding Murlin's diagnosis and corresponding treatment approaches and interventions. Licensed Clinical Social Worker, [place], LCSW will support the  patient's ability to achieve the goals identified. will employ CBT, BA, Problem-solving, Solution Focused, Mindfulness,  coping skills, & other evidenced-based practices will be used to promote progress towards healthy functioning to help manage decrease symptoms associated with his diagnosis.   Reduce overall level, frequency, and intensity of the feelings of depression, anxiety  evidenced by decreased overall symptoms from 6 to 7 days/week to 0 to 1 days/week per client report for at least 3 consecutive months. Verbally express understanding of the relationship between feelings of depression, anxiety and their impact on thinking patterns and behaviors. Verbalize an understanding of the role that distorted thinking plays in creating fears, excessive worry, and ruminations.  Clayton [name] participated in the creation of the treatment plan)    Delight Ovens, LCSW

## 2023-04-11 DIAGNOSIS — G4733 Obstructive sleep apnea (adult) (pediatric): Secondary | ICD-10-CM | POA: Diagnosis not present

## 2023-04-14 ENCOUNTER — Other Ambulatory Visit: Payer: Self-pay | Admitting: Cardiovascular Disease

## 2023-04-14 DIAGNOSIS — G4733 Obstructive sleep apnea (adult) (pediatric): Secondary | ICD-10-CM | POA: Diagnosis not present

## 2023-04-18 ENCOUNTER — Ambulatory Visit: Payer: Medicare PPO | Admitting: Cardiology

## 2023-04-18 NOTE — Progress Notes (Signed)
 [name] [address] [place] [address] Phone: [phone] Subjective:   Clayton [name], am serving as [name] scribe for Dr. Antoine Primas.  I'm seeing this patient by the request  of:  Kuneff, Renee A, DO  CC: Left knee pain, back pain follow-up  [id number]  Clayton [name] is [name] 78 y.o. male coming in with complaint of back and neck pain. OMT on 03/14/2023. Patient states that injection did not help as much as previously in L knee. Has not been as active recently to test knee. Would like to discuss gel again.          Reviewed prior external information including notes and imaging from previsou exam, outside providers and external EMR if available.   As well as notes that were available from care everywhere and other healthcare systems.  Past medical history, social, surgical and family history all reviewed in electronic medical record.  No pertanent information unless stated regarding to the chief complaint.   Past Medical History:  Diagnosis Date   Aortic atherosclerosis (HCC) 12/21/2021   Arthritis    hips    Atrial fibrillation (HCC)    Cataract    Chronic diastolic heart failure (HCC) 12/21/2021   Cochlear Meniere syndrome of left ear 06/09/2015   CPAP (continuous positive airway pressure) dependence    Dysrhythmia    PAF- hx of 7 years ago    HLD (hyperlipidemia)    Hypertension 05/29/2010   ECHO-EF 67% wnl   Meniere disease    Obesity    OSA on CPAP 08/20/2012   Palpitations    S/P right total hip arthroplasty 08/12/2020   Sleep apnea 09/14/2004   [facility]- Dr. Welton Flakes; CPAP titration 11/12/04- Dr. Welton Flakes   Squamous cell carcinoma of skin of neck    right; 2023    No Known Allergies   Review of Systems:  No headache, visual changes, nausea, vomiting, diarrhea, constipation, dizziness, abdominal pain, skin rash, fevers, chills, night sweats, weight loss, swollen lymph nodes, body aches, joint  swelling, chest pain, shortness of breath, mood changes. POSITIVE muscle aches  Objective  Blood pressure 124/72, pulse 73, height 6\' 3"  (1.905 m), weight 263 lb (119.3 kg), SpO2 98%.   General: No apparent distress alert and oriented x3 mood and affect normal, dressed appropriately.  HEENT: Pupils equal, extraocular movements intact  Respiratory: Patient's speak in full sentences and does not appear short of breath  Cardiovascular: No lower extremity edema, non tender, no erythema  Gait MSK:  Back back exam does have some loss lordosis.  Tightness noted in the paraspinal musculature.  Osteopathic findings  C2 flexed rotated and side bent right C6 flexed rotated and side bent left T3 extended rotated and side bent right inhaled rib T9 extended rotated and side bent left L2 flexed rotated and side bent right L3 flexed rotated and side bent left L5 flexed rotated and side bent right Sacrum right on right       Assessment and Plan:  No problem-specific Assessment & Plan notes found for this encounter.    Nonallopathic problems  Decision today to treat with OMT was based on Physical Exam  After verbal consent patient was treated with HVLA, ME, FPR techniques in cervical, rib, thoracic, lumbar, and sacral  areas  Patient tolerated the procedure well with improvement in symptoms  Patient given exercises, stretches and lifestyle modifications  See medications in patient instructions if given  Patient will follow  up in 4-8 weeks     The above documentation has been reviewed and is accurate and complete Judi Saa, DO         Note: This dictation was prepared with Dragon dictation along with smaller phrase technology. Any transcriptional errors that result from this process are unintentional.

## 2023-04-25 ENCOUNTER — Encounter: Payer: Self-pay | Admitting: Family Medicine

## 2023-04-25 ENCOUNTER — Ambulatory Visit: Payer: Medicare PPO | Admitting: Family Medicine

## 2023-04-25 VITALS — BP 124/72 | HR 73 | Ht 75.0 in | Wt 263.0 lb

## 2023-04-25 DIAGNOSIS — M51362 Other intervertebral disc degeneration, lumbar region with discogenic back pain and lower extremity pain: Secondary | ICD-10-CM | POA: Diagnosis not present

## 2023-04-25 DIAGNOSIS — M170 Bilateral primary osteoarthritis of knee: Secondary | ICD-10-CM | POA: Diagnosis not present

## 2023-04-25 DIAGNOSIS — M9908 Segmental and somatic dysfunction of rib cage: Secondary | ICD-10-CM | POA: Diagnosis not present

## 2023-04-25 DIAGNOSIS — M9903 Segmental and somatic dysfunction of lumbar region: Secondary | ICD-10-CM | POA: Diagnosis not present

## 2023-04-25 DIAGNOSIS — M9902 Segmental and somatic dysfunction of thoracic region: Secondary | ICD-10-CM

## 2023-04-25 DIAGNOSIS — M9901 Segmental and somatic dysfunction of cervical region: Secondary | ICD-10-CM

## 2023-04-25 DIAGNOSIS — M9904 Segmental and somatic dysfunction of sacral region: Secondary | ICD-10-CM

## 2023-04-25 NOTE — Assessment & Plan Note (Signed)
 Known arthritic changes.  Would consider the possibility of viscosupplementation.  Will get approval.

## 2023-04-25 NOTE — Assessment & Plan Note (Signed)
 Degenerative disc disease of the lumbar spine.  Will continue to monitor.  Has done relatively well but finding it difficult to lose weight like he has done in the past.  Hopeful that we can start increasing activity.  Discussed which activities to do and which ones to avoid.  Increase activity slowly otherwise.  Follow-up again in 6 to 8 weeks.

## 2023-04-28 DIAGNOSIS — H8102 Meniere's disease, left ear: Secondary | ICD-10-CM | POA: Diagnosis not present

## 2023-04-28 DIAGNOSIS — H903 Sensorineural hearing loss, bilateral: Secondary | ICD-10-CM | POA: Diagnosis not present

## 2023-04-28 DIAGNOSIS — H6123 Impacted cerumen, bilateral: Secondary | ICD-10-CM | POA: Diagnosis not present

## 2023-04-28 DIAGNOSIS — H90A22 Sensorineural hearing loss, unilateral, left ear, with restricted hearing on the contralateral side: Secondary | ICD-10-CM | POA: Diagnosis not present

## 2023-05-03 ENCOUNTER — Encounter: Payer: Self-pay | Admitting: Family Medicine

## 2023-05-09 ENCOUNTER — Other Ambulatory Visit: Payer: Self-pay | Admitting: Cardiovascular Disease

## 2023-05-12 DIAGNOSIS — G4733 Obstructive sleep apnea (adult) (pediatric): Secondary | ICD-10-CM | POA: Diagnosis not present

## 2023-05-16 ENCOUNTER — Encounter: Payer: Self-pay | Admitting: Family Medicine

## 2023-05-16 NOTE — Telephone Encounter (Signed)
 Please inform pt Bloodwork must be during visit.

## 2023-05-19 ENCOUNTER — Encounter: Payer: Self-pay | Admitting: Cardiovascular Disease

## 2023-05-20 ENCOUNTER — Ambulatory Visit (INDEPENDENT_AMBULATORY_CARE_PROVIDER_SITE_OTHER): Payer: Medicare PPO | Admitting: Family Medicine

## 2023-05-20 ENCOUNTER — Encounter: Payer: Self-pay | Admitting: Family Medicine

## 2023-05-20 VITALS — BP 110/70 | HR 84 | Temp 97.7°F | Ht 75.0 in | Wt 270.0 lb

## 2023-05-20 DIAGNOSIS — I4819 Other persistent atrial fibrillation: Secondary | ICD-10-CM

## 2023-05-20 DIAGNOSIS — E66812 Obesity, class 2: Secondary | ICD-10-CM

## 2023-05-20 DIAGNOSIS — G4733 Obstructive sleep apnea (adult) (pediatric): Secondary | ICD-10-CM

## 2023-05-20 DIAGNOSIS — I700 Atherosclerosis of aorta: Secondary | ICD-10-CM

## 2023-05-20 DIAGNOSIS — D6869 Other thrombophilia: Secondary | ICD-10-CM

## 2023-05-20 DIAGNOSIS — Z0000 Encounter for general adult medical examination without abnormal findings: Secondary | ICD-10-CM | POA: Diagnosis not present

## 2023-05-20 DIAGNOSIS — I5032 Chronic diastolic (congestive) heart failure: Secondary | ICD-10-CM

## 2023-05-20 DIAGNOSIS — I10 Essential (primary) hypertension: Secondary | ICD-10-CM

## 2023-05-20 DIAGNOSIS — E782 Mixed hyperlipidemia: Secondary | ICD-10-CM | POA: Diagnosis not present

## 2023-05-20 DIAGNOSIS — R7303 Prediabetes: Secondary | ICD-10-CM

## 2023-05-20 DIAGNOSIS — Z6835 Body mass index (BMI) 35.0-35.9, adult: Secondary | ICD-10-CM

## 2023-05-20 DIAGNOSIS — C189 Malignant neoplasm of colon, unspecified: Secondary | ICD-10-CM

## 2023-05-20 LAB — LIPID PANEL
Cholesterol: 136 mg/dL (ref 0–200)
HDL: 47.8 mg/dL (ref >39.00)
LDL Cholesterol: 62 mg/dL (ref 0–99)
NonHDL: 87.83
Total CHOL/HDL Ratio: 3
Triglycerides: 131 mg/dL (ref 0.0–149.0)
VLDL: 26.2 mg/dL (ref 0.0–40.0)

## 2023-05-20 LAB — COMPREHENSIVE METABOLIC PANEL WITH GFR
ALT: 36 U/L (ref 0–53)
AST: 29 U/L (ref 0–37)
Albumin: 4.9 g/dL (ref 3.5–5.2)
Alkaline Phosphatase: 58 U/L (ref 39–117)
BUN: 26 mg/dL — ABNORMAL HIGH (ref 6–23)
CO2: 27 meq/L (ref 19–32)
Calcium: 10.9 mg/dL — ABNORMAL HIGH (ref 8.4–10.5)
Chloride: 105 meq/L (ref 96–112)
Creatinine, Ser: 1.44 mg/dL (ref 0.40–1.50)
GFR: 47.11 mL/min — ABNORMAL LOW (ref >60.00)
Glucose, Bld: 114 mg/dL — ABNORMAL HIGH (ref 70–99)
Potassium: 5 meq/L (ref 3.5–5.1)
Sodium: 139 meq/L (ref 135–145)
Total Bilirubin: 1.6 mg/dL — ABNORMAL HIGH (ref 0.2–1.2)
Total Protein: 7.1 g/dL (ref 6.0–8.3)

## 2023-05-20 LAB — TSH: TSH: 1.73 u[IU]/mL (ref 0.35–5.50)

## 2023-05-20 LAB — HEMOGLOBIN A1C: Hgb A1c MFr Bld: 6.2 % (ref 4.6–6.5)

## 2023-05-20 MED ORDER — SPIRONOLACTONE 25 MG PO TABS: 25.0000 mg | ORAL_TABLET | Freq: Every day | ORAL | 3 refills | Status: DC

## 2023-05-20 MED ORDER — METFORMIN HCL 500 MG PO TABS: ORAL_TABLET | ORAL | 1 refills | Status: DC

## 2023-05-20 NOTE — Progress Notes (Signed)
 Clayton [name] , [birth year], 78 y.o., male MRN: [id number] Patient Care Team    Relationship Specialty Notifications Start End  Mariel Shope, DO PCP - General Family Medicine  06/09/15   Millicent Ally, MD Consulting Physician Cardiology  06/09/15   Devin Foerster, MD Consulting Physician Ophthalmology  06/09/15   Zula Hitch, MD Consulting Physician Ophthalmology  06/09/15   Glory Larsen, MD Consulting Physician Dermatology  06/09/15   Ryland Cozier, MD Consulting Physician Otolaryngology  06/09/15   Claiborne Crew, MD Consulting Physician Orthopedic Surgery  06/09/15   Andrez Banker, MD Attending Physician Urology  07/27/21   Albertina Hugger, MD Consulting Physician Gastroenterology  05/20/23   Lei Pump, MD Consulting Physician Cardiology  05/20/23   Belva Boyden, LCSW Social Worker Psychology  05/20/23     Chief Complaint  Patient presents with   Annual Exam    Chronic Conditions/illness Management Pt is fasting     Subjective: Clayton [name] is a 78 y.o. Pt presents for CPE and chronic Conditions/illness Management.  All past medical history, surgical history, family history and medication reconciliation updated and completed today.   Health maintenance:  Colon cancer: Patient diagnosed with colon cancer.  Last colonoscopy 01/05/2023 with 3-year follow-up- Dr. Dominic Friendly Immunizations:  declines vaccines.  Infectious disease screening: Hep C completed . PSA:pt was counseled on prostate cancer screenings. He follows with urology twice a year. Assistive device: none Oxygen  WUJ:[id number] Patient has a Dental home. Hospitalizations/ED visits: Reviewed  prediabetes.  Patient reports compliance with metformin  500 mg daily. Highest A1c 6.0 in EMR. He is exercising to tolerance of his orthopedic conditions.  He is working with Dr.Beasley on weight loss . He is following with cardiology routinely and they manage all cardiac meds.      05/20/2023    9:40  AM 04/14/2022    7:56 AM 01/01/2022   10:25 AM 07/27/2021    9:04 AM 07/04/2020    1:32 PM  Depression screen PHQ 2/9  Decreased Interest 0 0 0 0 0  Down, Depressed, Hopeless 0 0 0 0 0  PHQ - 2 Score 0 0 0 0 0    No Known Allergies Social History   Social History Narrative   Married. [name].Takes care of his mother-in-law as well.    Retired, Manufacturing engineer.    Drinks caffeine beverages. Takes a daily vitamin.   Exercises routinely.    Smoke detector in the home.    Ambulates independently.    2-sons   Past Medical History:  Diagnosis Date   Aortic atherosclerosis (HCC) 12/21/2021   Arthritis    hips    Atrial fibrillation (HCC)    Cataract    Chronic diastolic heart failure (HCC) 12/21/2021   Cochlear Meniere syndrome of left ear 06/09/2015   CPAP (continuous positive airway pressure) dependence    Dysrhythmia    PAF- hx of 7 years ago    HLD (hyperlipidemia)    Hypertension 05/29/2010   ECHO-EF 67% wnl   Meniere disease    Obesity    OSA on CPAP 08/20/2012   Palpitations    S/P right total hip arthroplasty 08/12/2020   Sleep apnea 09/14/2004   [facility]- Dr. Meredeth Stallion; CPAP titration 11/12/04- Dr. Meredeth Stallion   Squamous cell carcinoma of skin of neck    right; 2023   Past Surgical History:  Procedure Laterality Date   ATRIAL FIBRILLATION ABLATION N/A 09/10/2020  Procedure: ATRIAL FIBRILLATION ABLATION;  Surgeon: Lei Pump, MD;  Location: [facility] INVASIVE CV LAB;  Service: Cardiovascular;  Laterality: N/A;   CARDIAC CATHETERIZATION  11/03/2005   CARDIOVERSION N/A 09/26/2018   Procedure: CARDIOVERSION;  Surgeon: Harrold Lincoln, MD;  Location: [facility] ENDOSCOPY;  Service: Endoscopy;  Laterality: N/A;   CARDIOVERSION N/A 03/13/2019   Procedure: CARDIOVERSION;  Surgeon: Maudine Sos, MD;  Location: [facility] ENDOSCOPY;  Service: Cardiovascular;  Laterality: N/A;   CARDIOVERSION N/A 03/03/2021   Procedure: CARDIOVERSION;  Surgeon: Loyde Rule,  MD;  Location: [facility] ENDOSCOPY;  Service: Cardiovascular;  Laterality: N/A;   CATARACT EXTRACTION  01/19/2012   KNEE ARTHROSCOPY     left knee- 2002    LAPAROSCOPIC PARTIAL RIGHT COLECTOMY Right 12/21/2021   Procedure: LAPAROSCOPIC RIGHT HEMICOLECTOMY;  Surgeon: Melvenia Stabs, MD;  Location: [facility] ORS;  Service: General;  Laterality: Right;   TOTAL HIP ARTHROPLASTY  06/08/2011   Procedure: TOTAL HIP ARTHROPLASTY ANTERIOR APPROACH;  Surgeon: Bevin Bucks, MD;  Location: [facility] ORS;  Service: Orthopedics;  Laterality: Left;   TOTAL HIP ARTHROPLASTY Right 08/12/2020   Procedure: TOTAL HIP ARTHROPLASTY ANTERIOR APPROACH;  Surgeon: Claiborne Crew, MD;  Location: [facility] ORS;  Service: Orthopedics;  Laterality: Right;   Family History  Problem Relation Age of Onset   Hypertension Mother    Stroke Mother    Diabetes Mother    Hyperlipidemia Mother    Obesity Mother    Arthritis Father    Heart disease Father    Diabetes Sister    Stroke Maternal Grandmother        78 died    Heart disease Maternal Grandfather    Hypertension Maternal Grandfather    Diabetes Maternal Grandfather    Heart disease Paternal Grandfather    Allergies as of 05/20/2023   No Known Allergies      Medication List        Accurate as of May 20, 2023 10:19 AM. If you have any questions, ask your nurse or doctor.          STOP taking these medications    amoxicillin 500 MG capsule Commonly known as: AMOXIL Stopped by: Napolean Backbone       TAKE these medications    amiodarone  200 MG tablet Commonly known as: PACERONE  Take 200 mg by mouth.   amLODipine  10 MG tablet Commonly known as: NORVASC  TAKE 1 TABLET BY MOUTH EVERY DAY   Coenzyme Q10 200 MG capsule Take 200 mg by mouth at bedtime.   cyanocobalamin 100 MCG tablet Commonly known as: VITAMIN B12 Take 500 mcg by mouth daily. Patient takes a gummie   Eliquis  5 MG Tabs tablet Generic drug: apixaban  TAKE 1 TABLET BY MOUTH TWICE A DAY   ezetimibe  10  MG tablet Commonly known as: ZETIA  TAKE 1 TABLET (10 MG TOTAL) BY MOUTH IN THE MORNING   ferrous sulfate  325 (65 FE) MG EC tablet Take 325 mg by mouth in the morning and at bedtime.   metFORMIN  500 MG tablet Commonly known as: GLUCOPHAGE  TAKE 1 TABLET BY MOUTH EVERY DAY WITH BREAKFAST   metoprolol  succinate 50 MG 24 hr tablet Commonly known as: TOPROL -XL TAKE 100 MG (2 TABLETS) BY MOUTH IN THE MORNING, AND 75 MG (1.5 TABLETS) IN THE EVENING.   MULTI-VITAMIN/MINERALS PO Take by mouth daily. Takes two in the AM   olmesartan  20 MG tablet Commonly known as: BENICAR  Take 1 tablet (20 mg total) by mouth daily.   Pitavastatin  Calcium  2  MG Tabs TAKE 1 TABLET BY MOUTH EVERY DAY IN THE EVENING   spironolactone  25 MG tablet Commonly known as: ALDACTONE  Take 1 tablet (25 mg total) by mouth daily.   tamsulosin  0.4 MG Caps capsule Commonly known as: FLOMAX  Take 0.4 mg by mouth in the morning.   triamcinolone  cream 0.5 % Commonly known as: KENALOG  APPLY 1 APPLICATION TOPICALLY 2 (TWO) TIMES DAILY. TO AFFECTED AREAS.   ZINC 15 PO Take 30 mcg by mouth. Patient takes a gummy        All past medical history, surgical history, allergies, family history, immunizations andmedications were updated in the EMR today and reviewed under the history and medication portions of their EMR.     ROS Negative, with the exception of above mentioned in HPI   Objective:  BP 110/70   Pulse 84   Temp 97.7 F (36.5 C)   Ht 6\' 3"  (1.905 m)   Wt 270 lb (122.5 kg)   SpO2 98%   BMI 33.75 kg/m  Body mass index is 33.75 kg/m. Physical Exam Constitutional:      General: He is not in acute distress.    Appearance: Normal appearance. He is not ill-appearing, toxic-appearing or diaphoretic.  HENT:     Head: Normocephalic and atraumatic.     Right Ear: Tympanic membrane, ear canal and external ear normal. There is no impacted cerumen.     Left Ear: Tympanic membrane, ear canal and external ear  normal. There is no impacted cerumen.     Nose: Nose normal. No congestion or rhinorrhea.     Mouth/Throat:     Mouth: Mucous membranes are moist.     Pharynx: Oropharynx is clear. No oropharyngeal exudate or posterior oropharyngeal erythema.  Eyes:     General: No scleral icterus.       Right eye: No discharge.        Left eye: No discharge.     Extraocular Movements: Extraocular movements intact.     Pupils: Pupils are equal, round, and reactive to light.  Cardiovascular:     Rate and Rhythm: Normal rate and regular rhythm.     Pulses: Normal pulses.     Heart sounds: Normal heart sounds. No murmur heard.    No friction rub. No gallop.  Pulmonary:     Effort: Pulmonary effort is normal. No respiratory distress.     Breath sounds: Normal breath sounds. No stridor. No wheezing, rhonchi or rales.  Chest:     Chest wall: No tenderness.  Abdominal:     General: Abdomen is flat. Bowel sounds are normal. There is no distension.     Palpations: Abdomen is soft. There is no mass.     Tenderness: There is no abdominal tenderness. There is no right CVA tenderness, left CVA tenderness, guarding or rebound.     Hernia: No hernia is present.  Musculoskeletal:        General: No swelling or tenderness. Normal range of motion.     Cervical back: Normal range of motion and neck supple.     Right lower leg: No edema.     Left lower leg: No edema.  Lymphadenopathy:     Cervical: No cervical adenopathy.  Skin:    General: Skin is warm and dry.     Coloration: Skin is not jaundiced.     Findings: No bruising, lesion or rash.  Neurological:     General: No focal deficit present.     Mental Status: He  is alert and oriented to person, place, and time. Mental status is at baseline.     Cranial Nerves: No cranial nerve deficit.     Sensory: No sensory deficit.     Motor: No weakness.     Coordination: Coordination normal.     Gait: Gait normal.     Deep Tendon Reflexes: Reflexes normal.   Psychiatric:        Mood and Affect: Mood normal.        Behavior: Behavior normal.        Thought Content: Thought content normal.        Judgment: Judgment normal.     No results found. No results found. No results found for this or any previous visit (from the past 24 hours).   Assessment/Plan: KELLI MOSLEY is a 78 y.o. male present for OV for  Pre-diabetes/obesity Stable Continue metformin  500 mg daily.  - POCT HgB A1C 5.6>5.9 >A1c collected today  A.fib/HTN/HLD/Aortic atherosclerosis-statin myopathy/aortic atherosclerosis/Chronic diastolic/Secondary hypercoagulable state [facility]): Continue follow-ups with cardiology.>eliquis ,amiodarone , metoprolol , spiro, zetiabenicar Continue Zetia  CBC, CMP, lipid collected  Adenocarcinoma, colon (HCC) Continue routine follow up with gastro   Routine general medical examination at a health care facility (Primary) - CBC - Comprehensive metabolic panel with GFR -TSH Patient was encouraged to exercise greater than 150 minutes a week. Patient was encouraged to choose a diet filled with fresh fruits and vegetables, and lean meats. AVS provided to patient today for education/recommendation on gender specific health and safety maintenance. Colon cancer: Patient diagnosed with colon cancer.  Last colonoscopy 01/05/2023 with 3-year follow-up- Dr. Dominic Friendly Immunizations:  declines vaccines.  Infectious disease screening: Hep C completed . PSA:pt was counseled on prostate cancer screenings. He follows with urology twice a year.  Reviewed expectations re: course of current medical issues. Discussed self-management of symptoms. Outlined signs and symptoms indicating need for more acute intervention. Patient verbalized understanding and all questions were answered. Patient received an After-Visit Summary.    Orders Placed This Encounter  Procedures   CBC   Comprehensive metabolic panel with GFR   Hemoglobin A1c   TSH   Lipid panel    Meds ordered this encounter  Medications   metFORMIN  (GLUCOPHAGE ) 500 MG tablet    Sig: TAKE 1 TABLET BY MOUTH EVERY DAY WITH BREAKFAST    Dispense:  90 tablet    Refill:  1   Referral Orders  No referral(s) requested today     Note is dictated utilizing voice recognition software. Although note has been proof read prior to signing, occasional typographical errors still can be missed. If any questions arise, please do not hesitate to call for verification.   electronically signed by:  Napolean Backbone, DO  [facility] - [place]

## 2023-05-20 NOTE — Patient Instructions (Addendum)
 Return in about 26 weeks (around 11/18/2023) for Routine chronic condition follow-up.        Great to see you today.  I have refilled the medication(s) we provide.   If labs were collected or images ordered, we will inform you of  results once we have received them and reviewed. We will contact you either by echart message, or telephone call.  Please give ample time to the testing facility, and our office to run,  receive and review results. Please do not call inquiring of results, even if you can see them in your chart. We will contact you as soon as we are able. If it has been over 1 week since the test was completed, and you have not yet heard from us , then please call us .    - echart message- for normal results that have been seen by the patient already.   - telephone call: abnormal results or if patient has not viewed results in their echart.  If a referral to a specialist was entered for you, please call us  in 2 weeks if you have not heard from the specialist office to schedule.

## 2023-05-22 LAB — CBC
HCT: 47.4 % (ref 39.0–52.0)
Hemoglobin: 16 g/dL (ref 13.0–17.0)
MCHC: 33.7 g/dL (ref 30.0–36.0)
MCV: 94.3 fl (ref 78.0–100.0)
Platelets: 191 10*3/uL (ref 150.0–400.0)
RBC: 5.03 Mil/uL (ref 4.22–5.81)
RDW: 13.2 % (ref 11.5–15.5)
WBC: 5.2 10*3/uL (ref 4.0–10.5)

## 2023-05-23 ENCOUNTER — Ambulatory Visit (INDEPENDENT_AMBULATORY_CARE_PROVIDER_SITE_OTHER): Admitting: Psychology

## 2023-05-23 ENCOUNTER — Telehealth: Payer: Self-pay | Admitting: Family Medicine

## 2023-05-23 DIAGNOSIS — F411 Generalized anxiety disorder: Secondary | ICD-10-CM

## 2023-05-23 DIAGNOSIS — R17 Unspecified jaundice: Secondary | ICD-10-CM

## 2023-05-23 NOTE — Telephone Encounter (Signed)
 LVM to discuss

## 2023-05-23 NOTE — Progress Notes (Signed)
 [facility] Counselor/Therapist Progress Note  Patient ID: Clayton [name], MRN: [id number]    Date: 05/23/23  Time Spent: 10:02 am - 10:53 am : 51 Minutes  Treatment Type: Individual Therapy.  Reported Symptoms: anxiety.   Mental Status Exam: Appearance:  Neat and Well Groomed     Behavior: Appropriate  Motor: Normal  Speech/Language:  Clear and Coherent  Affect: Appropriate  Mood: normal  Thought process: normal  Thought content:   WNL  Sensory/Perceptual disturbances:   WNL  Orientation: oriented to person, place, time/date, and situation  Attention: Good  Concentration: Good  Memory: WNL  Fund of knowledge:  Good  Insight:   Good  Judgment:  Good  Impulse Control: Good   Risk Assessment: Danger to Self:  No Self-injurious Behavior: No Danger to Others: No Duty to Warn:no Physical Aggression / Violence:No  Access to Firearms a concern: No  Gang Involvement:No   Subjective:   Clayton [name] participated in the session, in person in the office with the therapist, and consented to treatment Clayton [name] reviewed the events of the past week. Clayton [name] noted recently having his annual medical check-up and noted no concerns. He noted continued night-time anxiety but noted having various tools to manage this proactively. He noted that "this isn't costing me any sleep". However, he noted "the fact that it's there" may not be good enough. He noted the need for more a "deep dive" into the source of his anxiety. He noted not being afraid of death and added being "not religious". He noted his worry and concern for his wife, should something happen to him. He noted making plans including a will to aid in this, when this time comes. We worked on exploring his worry and Clayton [name] noted his attempts to "limit" the pain. We worked on processing this during the session and identifying areas of control and lack of control including the likelihood of mitigating the pain of grief and loss  for his wife, when he passes. He noted difficulty communicating his worries to his wife due to the distress that she experiences and noted her general reluctance to engage in this discussion. We worked on identifying ways to communicate his concerns. Therapist reviewed use of empathy and modeled this during the session. Clayton [name] was receptive to this. Clayton [name] noted some progress with his health in relation to weight and exercise but noted not exercising as often as he would like to. We worked on identifying possible barriers. He noted a need to further prioritize his exercise. We worked on exploring this and identifying ways to manage this going forward. Clayton [name] was engaged and motivated during the session. He expressed commitment towards goals. Therapist praised Clayton [name] and provided supportive therapy. A follow-up was scheduled for continued treatment, which he benefits from.   Interventions: CBT   Diagnosis:   Generalized anxiety disorder  Psychiatric Treatment: No , NA  Treatment Plan:  Client Abilities/Strengths Clayton [name] is intelligent, forthcoming, and motivated for change.   Support System: Family  Client Treatment Preferences Outpatient Therapy.   Client Statement of Needs Clayton [name] would like to manage his anxiety, specifically morning anxiety, including engaging in more mindfulness, processing past events, manage day to day stress,  process his anxiety (specifically upon waking), process loss of first wife and the effect of this on his anxiety in relation to his current wife, Clayton [name] (labeled PTSD by Clayton [name]).   Treatment Level Weekly  Symptoms  Anxiety: anxiety, difficulty managing worry, worrying about different things.   (Status:  maintained)  Goals:   Clayton [name] experiences symptoms of Anxiety.   Treatment plan signed and available on s-drive:  No, pending patient signature.     Target Date: 07/16/23 Frequency: Weekly  Progress: 0 Modality: individual    Therapist will provide referrals  for additional resources as appropriate.  Therapist will provide psycho-education regarding Clayton [name]'s diagnosis and corresponding treatment approaches and interventions. Licensed Clinical Social Worker, [place], LCSW will support the patient's ability to achieve the goals identified. will employ CBT, BA, Problem-solving, Solution Focused, Mindfulness,  coping skills, & other evidenced-based practices will be used to promote progress towards healthy functioning to help manage decrease symptoms associated with his diagnosis.   Reduce overall level, frequency, and intensity of the feelings of depression, anxiety  evidenced by decreased overall symptoms from 6 to 7 days/week to 0 to 1 days/week per client report for at least 3 consecutive months. Verbally express understanding of the relationship between feelings of depression, anxiety and their impact on thinking patterns and behaviors. Verbalize an understanding of the role that distorted thinking plays in creating fears, excessive worry, and ruminations.  Clayton [name] participated in the creation of the treatment plan)    Belva Boyden, LCSW

## 2023-05-23 NOTE — Telephone Encounter (Signed)
 Please call patient, He has a new elevated bilirubin of 1.6.  Will need to repeat this to ensure accuracy.  If still elevated would need to be referred back to his gastroenterologist for further evaluation on cause. He has a mildly elevated calcium  level, I would encourage him to increase hydration levels, no need to recheck the calcium  levels as well.  Cholesterol levels are at goal for him. Blood cell counts are normal  A1c has increased from 5.9, now 6.2.  Still adequate control but increasing.  Would recommend increasing exercise routine.  Please schedule him for follow up in 1-2 weeks on abnormalities above.

## 2023-05-24 NOTE — Telephone Encounter (Signed)
 LVM to discuss. Sent MyChart.

## 2023-05-25 ENCOUNTER — Encounter: Payer: Self-pay | Admitting: Family Medicine

## 2023-05-25 ENCOUNTER — Telehealth: Payer: Self-pay

## 2023-05-25 NOTE — Telephone Encounter (Signed)
 Copied from CRM [id number]. Topic: General - Other >> May 24, 2023  4:28 PM Alyse July wrote: Reason for CRM: patient reviewed lab results and wanted to inform provider prior to his next blood draw that he was diagnosed with Gilbert's syndrome 20 years ago which is the reason he has elevated bilirubin levels.

## 2023-06-07 ENCOUNTER — Ambulatory Visit: Admitting: Cardiology

## 2023-06-07 NOTE — Progress Notes (Signed)
 [facility] [address] [place] [address] Phone: [phone] Subjective:   IBryan [name], am serving as [name] scribe for Dr. Ronnell Coins.  I'm seeing this patient by the request  of:  Kuneff, Renee A, DO  CC: back and neck pain follow up   ONG:[id number]  Clayton [name] is [name] 78 y.o. male coming in with complaint of back and neck pain. OMT on 04/25/2023. Patient states doing well. Wants options for the L knee.  Medications patient has been prescribed:   Taking:    Recently did have labs by primary care and was found to have an elevated PTH and an elevated calcium  level.  Significant amount of fatigue recently.     Reviewed prior external information including notes and imaging from previsou exam, outside providers and external EMR if available.   As well as notes that were available from care everywhere and other healthcare systems.  Past medical history, social, surgical and family history all reviewed in electronic medical record.  No pertanent information unless stated regarding to the chief complaint.   Past Medical History:  Diagnosis Date   Aortic atherosclerosis (HCC) 12/21/2021   Arthritis    hips    Atrial fibrillation (HCC)    Cataract    Chronic diastolic heart failure (HCC) 12/21/2021   Cochlear Meniere syndrome of left ear 06/09/2015   CPAP (continuous positive airway pressure) dependence    Dysrhythmia    PAF- hx of 7 years ago    Gilbert's disease    HLD (hyperlipidemia)    Hypertension 05/29/2010   ECHO-EF 67% wnl   Meniere disease    Obesity    OSA on CPAP 08/20/2012   Palpitations    S/P right total hip arthroplasty 08/12/2020   Sleep apnea 09/14/2004   [facility]- Dr. Meredeth Stallion; CPAP titration 11/12/04- Dr. Meredeth Stallion   Squamous cell carcinoma of skin of neck    right; 2023    No Known Allergies   Review of Systems:  No headache, visual changes, nausea, vomiting, diarrhea, constipation,  dizziness, abdominal pain, skin rash, fevers, chills, night sweats, weight loss, swollen lymph nodes, body aches, joint swelling, chest pain, shortness of breath, mood changes. POSITIVE muscle aches  Objective  Blood pressure 124/78, pulse (!) 105, height 6\' 3"  (1.905 m), SpO2 (!) 65%.   General: No apparent distress alert and oriented x3 mood and affect normal, dressed appropriately.  HEENT: Pupils equal, extraocular movements intact  Respiratory: Patient's speak in full sentences and does not appear short of breath  Cardiovascular: No lower extremity edema, non tender, no erythema  Gait MSK:  Back back does have some loss lordosis noted.  Some tightness noted with FABER test. Left knee does have significant arthritic changes noted.  More in the medial compartment.  Trace effusion noted of the  Osteopathic findings  C7 flexed rotated and side bent left T3 extended rotated and side bent right inhaled rib T9 extended rotated and side bent left L1 flexed rotated and side bent right Sacrum right on right   After informed written and verbal consent, patient was seated on exam table. Left knee was prepped with alcohol swab and utilizing anterolateral approach, patient's left knee space was injected with 4:1  marcaine  0.5%: Kenalog  40mg /dL. Patient tolerated the procedure well without immediate complications.    Assessment and Plan:  No problem-specific Assessment & Plan notes found for this encounter.    Nonallopathic problems  Decision today to  treat with OMT was based on Physical Exam  After verbal consent patient was treated with HVLA, ME, FPR techniques in cervical, rib, thoracic, lumbar, and sacral  areas  Patient tolerated the procedure well with improvement in symptoms  Patient given exercises, stretches and lifestyle modifications  See medications in patient instructions if given  Patient will follow up in 4-8 weeks     The above documentation has been reviewed and  is accurate and complete Kinslei Labine M Brandun Pinn, DO         Note: This dictation was prepared with Dragon dictation along with smaller phrase technology. Any transcriptional errors that result from this process are unintentional.

## 2023-06-08 ENCOUNTER — Ambulatory Visit: Admitting: Family Medicine

## 2023-06-08 ENCOUNTER — Encounter: Payer: Self-pay | Admitting: Family Medicine

## 2023-06-08 DIAGNOSIS — Z9049 Acquired absence of other specified parts of digestive tract: Secondary | ICD-10-CM | POA: Diagnosis not present

## 2023-06-08 DIAGNOSIS — R17 Unspecified jaundice: Secondary | ICD-10-CM

## 2023-06-08 DIAGNOSIS — Z85038 Personal history of other malignant neoplasm of large intestine: Secondary | ICD-10-CM | POA: Insufficient documentation

## 2023-06-08 HISTORY — DX: Personal history of other malignant neoplasm of large intestine: Z85.038

## 2023-06-08 LAB — HEPATIC FUNCTION PANEL
ALT: 34 U/L (ref 0–53)
AST: 24 U/L (ref 0–37)
Albumin: 4.6 g/dL (ref 3.5–5.2)
Alkaline Phosphatase: 58 U/L (ref 39–117)
Bilirubin, Direct: 0.2 mg/dL (ref 0.0–0.3)
Total Bilirubin: 1.2 mg/dL (ref 0.2–1.2)
Total Protein: 6.8 g/dL (ref 6.0–8.3)

## 2023-06-08 LAB — VITAMIN D 25 HYDROXY (VIT D DEFICIENCY, FRACTURES): VITD: 30.52 ng/mL (ref 30.00–100.00)

## 2023-06-08 NOTE — Progress Notes (Signed)
 Clayton [name] , April 07, 1945, 78 y.o., male MRN: [id number] Patient Care Team    Relationship Specialty Notifications Start End  Mariel Shope, DO PCP - General Family Medicine  06/09/15   Millicent Ally, MD Consulting Physician Cardiology  06/09/15   Devin Foerster, MD Consulting Physician Ophthalmology  06/09/15   Zula Hitch, MD Consulting Physician Ophthalmology  06/09/15   Glory Larsen, MD Consulting Physician Dermatology  06/09/15   Ryland Cozier, MD Consulting Physician Otolaryngology  06/09/15   Claiborne Crew, MD Consulting Physician Orthopedic Surgery  06/09/15   Andrez Banker, MD Attending Physician Urology  07/27/21   Albertina Hugger, MD Consulting Physician Gastroenterology  05/20/23   Lei Pump, MD Consulting Physician Cardiology  05/20/23   Belva Boyden, LCSW Social Worker Psychology  05/20/23     Chief Complaint  Patient presents with   hypercalcemia    Pt is fasting.      Subjective: Clayton [name] is a 78 y.o. Pt presents for an OV to follow up on abnormal labs during his CPE 2 weeks ago.  He has a known h/o colon cancer s/p colectomy and gilberts syndrome. Recent labs revealed an elevated bili (higher than his usual at 1.9 and an elevated calcium  of 10.9. Pt was aked to hydrate well and return for labs for recheck, given his cancer history and lab abnormalities . Pt has reported feeling more fatigued.        05/20/2023    9:40 AM 04/14/2022    7:56 AM 01/01/2022   10:25 AM 07/27/2021    9:04 AM 07/04/2020    1:32 PM  Depression screen PHQ 2/9  Decreased Interest 0 0 0 0 0  Down, Depressed, Hopeless 0 0 0 0 0  PHQ - 2 Score 0 0 0 0 0    No Known Allergies Social History   Social History Narrative   Married. Clayton [name].Takes care of his mother-in-law as well.    Retired, Manufacturing engineer.    Drinks caffeine beverages. Takes a daily vitamin.   Exercises routinely.    Smoke detector in the home.    Ambulates  independently.    2-sons   Past Medical History:  Diagnosis Date   Aortic atherosclerosis (HCC) 12/21/2021   Arthritis    hips    Atrial fibrillation (HCC)    Cataract    Chronic diastolic heart failure (HCC) 12/21/2021   Cochlear Meniere syndrome of left ear 06/09/2015   CPAP (continuous positive airway pressure) dependence    Dysrhythmia    PAF- hx of 7 years ago    Gilbert's disease    HLD (hyperlipidemia)    Hypertension 05/29/2010   ECHO-EF 67% wnl   Meniere disease    Obesity    OSA on CPAP 08/20/2012   Palpitations    S/P right total hip arthroplasty 08/12/2020   Sleep apnea 09/14/2004   [facility]- Dr. Meredeth Stallion; CPAP titration 11/12/04- Dr. Meredeth Stallion   Squamous cell carcinoma of skin of neck    right; 2023   Past Surgical History:  Procedure Laterality Date   ATRIAL FIBRILLATION ABLATION N/A 09/10/2020   Procedure: ATRIAL FIBRILLATION ABLATION;  Surgeon: Lei Pump, MD;  Location: [facility] INVASIVE CV LAB;  Service: Cardiovascular;  Laterality: N/A;   CARDIAC CATHETERIZATION  11/03/2005   CARDIOVERSION N/A 09/26/2018   Procedure: CARDIOVERSION;  Surgeon: Harrold Lincoln, MD;  Location: [facility] ENDOSCOPY;  Service: Endoscopy;  Laterality: N/A;  CARDIOVERSION N/A 03/13/2019   Procedure: CARDIOVERSION;  Surgeon: Maudine Sos, MD;  Location: [facility] ENDOSCOPY;  Service: Cardiovascular;  Laterality: N/A;   CARDIOVERSION N/A 03/03/2021   Procedure: CARDIOVERSION;  Surgeon: Loyde Rule, MD;  Location: [facility] ENDOSCOPY;  Service: Cardiovascular;  Laterality: N/A;   CATARACT EXTRACTION  01/19/2012   KNEE ARTHROSCOPY     left knee- 2002    LAPAROSCOPIC PARTIAL RIGHT COLECTOMY Right 12/21/2021   Procedure: LAPAROSCOPIC RIGHT HEMICOLECTOMY;  Surgeon: Melvenia Stabs, MD;  Location: [facility] ORS;  Service: General;  Laterality: Right;   TOTAL HIP ARTHROPLASTY  06/08/2011   Procedure: TOTAL HIP ARTHROPLASTY ANTERIOR APPROACH;  Surgeon: Bevin Bucks, MD;  Location:  [facility] ORS;  Service: Orthopedics;  Laterality: Left;   TOTAL HIP ARTHROPLASTY Right 08/12/2020   Procedure: TOTAL HIP ARTHROPLASTY ANTERIOR APPROACH;  Surgeon: Claiborne Crew, MD;  Location: [facility] ORS;  Service: Orthopedics;  Laterality: Right;   Family History  Problem Relation Age of Onset   Hypertension Mother    Stroke Mother    Diabetes Mother    Hyperlipidemia Mother    Obesity Mother    Arthritis Father    Heart disease Father    Other Father        gilberts   Diabetes Sister    Stroke Maternal Grandmother        48 died    Heart disease Maternal Grandfather    Hypertension Maternal Grandfather    Diabetes Maternal Grandfather    Heart disease Paternal Grandfather    Allergies as of 06/08/2023   No Known Allergies      Medication List        Accurate as of Jun 08, 2023  8:38 AM. If you have any questions, ask your nurse or doctor.          amiodarone  200 MG tablet Commonly known as: PACERONE  Take 200 mg by mouth.   amLODipine  10 MG tablet Commonly known as: NORVASC  TAKE 1 TABLET BY MOUTH EVERY DAY   Coenzyme Q10 200 MG capsule Take 200 mg by mouth at bedtime.   cyanocobalamin 100 MCG tablet Commonly known as: VITAMIN B12 Take 500 mcg by mouth daily. Patient takes a gummie   Eliquis  5 MG Tabs tablet Generic drug: apixaban  TAKE 1 TABLET BY MOUTH TWICE A DAY   ezetimibe  10 MG tablet Commonly known as: ZETIA  TAKE 1 TABLET (10 MG TOTAL) BY MOUTH IN THE MORNING   ferrous sulfate  325 (65 FE) MG EC tablet Take 325 mg by mouth in the morning and at bedtime.   metFORMIN  500 MG tablet Commonly known as: GLUCOPHAGE  TAKE 1 TABLET BY MOUTH EVERY DAY WITH BREAKFAST   metoprolol  succinate 50 MG 24 hr tablet Commonly known as: TOPROL -XL TAKE 100 MG (2 TABLETS) BY MOUTH IN THE MORNING, AND 75 MG (1.5 TABLETS) IN THE EVENING.   MULTI-VITAMIN/MINERALS PO Take by mouth daily. Takes two in the AM   olmesartan  20 MG tablet Commonly known as: BENICAR  Take 1 tablet  (20 mg total) by mouth daily.   Pitavastatin  Calcium  2 MG Tabs TAKE 1 TABLET BY MOUTH EVERY DAY IN THE EVENING   spironolactone  25 MG tablet Commonly known as: ALDACTONE  Take 1 tablet (25 mg total) by mouth daily.   tamsulosin  0.4 MG Caps capsule Commonly known as: FLOMAX  Take 0.4 mg by mouth in the morning.   triamcinolone  cream 0.5 % Commonly known as: KENALOG  APPLY 1 APPLICATION TOPICALLY 2 (TWO) TIMES DAILY. TO AFFECTED AREAS.  ZINC 15 PO Take 30 mcg by mouth. Patient takes a gummy        All past medical history, surgical history, allergies, family history, immunizations andmedications were updated in the EMR today and reviewed under the history and medication portions of their EMR.     ROS Negative, with the exception of above mentioned in HPI   Objective:  BP 136/82   Pulse 85   Temp 98.1 F (36.7 C)   Wt 278 lb (126.1 kg)   SpO2 96%   BMI 34.75 kg/m  Body mass index is 34.75 kg/m. Physical Exam Vitals and nursing note reviewed. Exam conducted with a chaperone present.  Constitutional:      General: He is not in acute distress.    Appearance: Normal appearance. He is not ill-appearing, toxic-appearing or diaphoretic.  HENT:     Head: Normocephalic and atraumatic.  Eyes:     General: No scleral icterus.       Right eye: No discharge.        Left eye: No discharge.     Extraocular Movements: Extraocular movements intact.     Pupils: Pupils are equal, round, and reactive to light.  Abdominal:     General: Bowel sounds are normal. There is no distension.     Palpations: Abdomen is soft. There is no mass.     Tenderness: There is no abdominal tenderness. There is no guarding or rebound.  Skin:    General: Skin is warm and dry.     Coloration: Skin is not jaundiced or pale.     Findings: No rash.  Neurological:     Mental Status: He is alert and oriented to person, place, and time. Mental status is at baseline.  Psychiatric:        Mood and Affect:  Mood normal.        Behavior: Behavior normal.        Thought Content: Thought content normal.        Judgment: Judgment normal.    No results found. No results found. No results found for this or any previous visit (from the past 24 hours).  Assessment/Plan: GEORDIE NOONEY is a 78 y.o. male present for OV for  Hypercalcemia (Primary)/Elevated bilirubin/h/o colon cancer s/p colectomy.  Pt endorses increase in fatigue. He is not taking any added Ca or vit d at this time.  UTD with colonoscopy 12/2022. - PTH, Intact and Calcium  - Vitamin D  (25 hydroxy) - Hepatic function panel - Bilirubin, fractionated(tot/dir/indir)   Reviewed expectations re: course of current medical issues. Discussed self-management of symptoms. Outlined signs and symptoms indicating need for more acute intervention. Patient verbalized understanding and all questions were answered. Patient received an After-Visit Summary.    Orders Placed This Encounter  Procedures   PTH, Intact and Calcium    Vitamin D  (25 hydroxy)   Hepatic function panel   Bilirubin, fractionated(tot/dir/indir)   No orders of the defined types were placed in this encounter.  Referral Orders  No referral(s) requested today     Note is dictated utilizing voice recognition software. Although note has been proof read prior to signing, occasional typographical errors still can be missed. If any questions arise, please do not hesitate to call for verification.   electronically signed by:  Napolean Backbone, DO  [facility] - [place]

## 2023-06-08 NOTE — Patient Instructions (Signed)

## 2023-06-09 ENCOUNTER — Ambulatory Visit: Payer: Self-pay | Admitting: Family Medicine

## 2023-06-09 ENCOUNTER — Ambulatory Visit: Admitting: Family Medicine

## 2023-06-09 ENCOUNTER — Encounter: Payer: Self-pay | Admitting: Family Medicine

## 2023-06-09 VITALS — BP 124/78 | HR 105 | Ht 75.0 in

## 2023-06-09 DIAGNOSIS — M9902 Segmental and somatic dysfunction of thoracic region: Secondary | ICD-10-CM | POA: Diagnosis not present

## 2023-06-09 DIAGNOSIS — M9904 Segmental and somatic dysfunction of sacral region: Secondary | ICD-10-CM

## 2023-06-09 DIAGNOSIS — E210 Primary hyperparathyroidism: Secondary | ICD-10-CM | POA: Insufficient documentation

## 2023-06-09 DIAGNOSIS — M9903 Segmental and somatic dysfunction of lumbar region: Secondary | ICD-10-CM | POA: Diagnosis not present

## 2023-06-09 DIAGNOSIS — M9908 Segmental and somatic dysfunction of rib cage: Secondary | ICD-10-CM

## 2023-06-09 DIAGNOSIS — M9901 Segmental and somatic dysfunction of cervical region: Secondary | ICD-10-CM

## 2023-06-09 DIAGNOSIS — M1712 Unilateral primary osteoarthritis, left knee: Secondary | ICD-10-CM

## 2023-06-09 DIAGNOSIS — M51362 Other intervertebral disc degeneration, lumbar region with discogenic back pain and lower extremity pain: Secondary | ICD-10-CM | POA: Diagnosis not present

## 2023-06-09 LAB — PTH, INTACT AND CALCIUM
Calcium: 10.4 mg/dL — ABNORMAL HIGH (ref 8.6–10.3)
PTH: 127 pg/mL — ABNORMAL HIGH (ref 16–77)

## 2023-06-09 LAB — BILIRUBIN, FRACTIONATED(TOT/DIR/INDIR)
Bilirubin, Direct: 0.3 mg/dL — ABNORMAL HIGH (ref 0.0–0.2)
Indirect Bilirubin: 0.9 mg/dL (ref 0.2–1.2)
Total Bilirubin: 1.2 mg/dL (ref 0.2–1.2)

## 2023-06-09 NOTE — Assessment & Plan Note (Signed)
 DDD discussed HEP  COnitnue to work on core strength ]See patient again in 6-8 weeks

## 2023-06-09 NOTE — Assessment & Plan Note (Signed)
 Patient given injection and tolerated procedure.  Has been 3 years since the last injection.  Anticipate that it will continue to make improvement.  Discussed icing regimen and home exercises, increase activity slowly.  Follow-up again in 6 to 8 weeks

## 2023-06-09 NOTE — Assessment & Plan Note (Addendum)
 Elevated PTH, will get u/s neck but will also send to general surgery to further evaluate for likely adenoma patient has a history also of colon cancer but I do not think that this is likely.

## 2023-06-09 NOTE — Telephone Encounter (Signed)
 Please call patient His calcium  is a little lower than last collection, but still above normal. His parathyroid hormone is elevated.  Parathyroid glands are 4 small glands that surround the thyroid  and the front of the neck (below Adam's apple area).  Parathyroid hormone regulates calcium  and vitamin D , amongst other functions.  Muscle weakness and fatigue are along the most common symptoms patients experience.  I have placed a referral to endocrinology for further evaluation.  Depending upon their evaluation they may monitor over time versus obtaining imaging of the parathyroid glands to rule out parathyroid masses.  Because I know he is already thinking it-cancer of the parathyroid is very very rare, but noncancerous masses also occur and it makes the parathyroid secrete too much hormone.  Endocrinology will call him to get him scheduled. Referred to [facility] medical [facility] since [facility] endocrinology wait is really long right now.  In the meantime hydrate well, avoid any added calcium  supplements.

## 2023-06-09 NOTE — Patient Instructions (Addendum)
 Soft tissue neck UltraSound Injection in knee today See you again in 6-8 weeks

## 2023-06-11 DIAGNOSIS — G4733 Obstructive sleep apnea (adult) (pediatric): Secondary | ICD-10-CM | POA: Diagnosis not present

## 2023-06-14 ENCOUNTER — Telehealth: Payer: Self-pay | Admitting: Family Medicine

## 2023-06-14 NOTE — Telephone Encounter (Signed)
 Spoke with patient. He wanted to know if he should proceed with US  and who ordered it. Told him that Dr. Felipe Horton recommends it and that Dr. Felipe Horton will let patient know next step once he has the results. Patient voices understanding.

## 2023-06-14 NOTE — Telephone Encounter (Signed)
 Patient called requesting a call back from [place] or [place] with follow up questions in regards to his recent visit.  Please call his home phone at [phone].

## 2023-06-15 ENCOUNTER — Ambulatory Visit
Admission: RE | Admit: 2023-06-15 | Discharge: 2023-06-15 | Discharge status: Home / Self Care | Source: Ambulatory Visit | Attending: Family Medicine | Admitting: Family Medicine

## 2023-06-15 DIAGNOSIS — E041 Nontoxic single thyroid nodule: Secondary | ICD-10-CM | POA: Diagnosis not present

## 2023-06-20 ENCOUNTER — Ambulatory Visit: Payer: Self-pay | Admitting: Family Medicine

## 2023-06-20 ENCOUNTER — Ambulatory Visit: Payer: Medicare PPO | Admitting: Cardiovascular Disease

## 2023-06-28 ENCOUNTER — Ambulatory Visit: Payer: Medicare PPO | Attending: Cardiovascular Disease | Admitting: Cardiovascular Disease

## 2023-06-28 ENCOUNTER — Encounter: Payer: Self-pay | Admitting: Cardiovascular Disease

## 2023-06-28 VITALS — BP 138/96 | HR 72 | Ht 75.0 in | Wt 274.0 lb

## 2023-06-28 DIAGNOSIS — C189 Malignant neoplasm of colon, unspecified: Secondary | ICD-10-CM

## 2023-06-28 DIAGNOSIS — Z9889 Other specified postprocedural states: Secondary | ICD-10-CM | POA: Diagnosis not present

## 2023-06-28 DIAGNOSIS — I5032 Chronic diastolic (congestive) heart failure: Secondary | ICD-10-CM | POA: Diagnosis not present

## 2023-06-28 DIAGNOSIS — I10 Essential (primary) hypertension: Secondary | ICD-10-CM | POA: Diagnosis not present

## 2023-06-28 DIAGNOSIS — G4733 Obstructive sleep apnea (adult) (pediatric): Secondary | ICD-10-CM

## 2023-06-28 DIAGNOSIS — D6869 Other thrombophilia: Secondary | ICD-10-CM

## 2023-06-28 DIAGNOSIS — I700 Atherosclerosis of aorta: Secondary | ICD-10-CM | POA: Diagnosis not present

## 2023-06-28 DIAGNOSIS — Z8679 Personal history of other diseases of the circulatory system: Secondary | ICD-10-CM

## 2023-06-28 DIAGNOSIS — E669 Obesity, unspecified: Secondary | ICD-10-CM

## 2023-06-28 DIAGNOSIS — I480 Paroxysmal atrial fibrillation: Secondary | ICD-10-CM | POA: Diagnosis not present

## 2023-06-28 DIAGNOSIS — K6389 Other specified diseases of intestine: Secondary | ICD-10-CM

## 2023-06-28 DIAGNOSIS — I4819 Other persistent atrial fibrillation: Secondary | ICD-10-CM

## 2023-06-28 MED ORDER — APIXABAN 5 MG PO TABS: 5.0000 mg | ORAL_TABLET | Freq: Two times a day (BID) | ORAL | 1 refills | Status: DC

## 2023-06-28 NOTE — Patient Instructions (Signed)
 Medication Instructions:  Your physician recommends that you continue on your current medications as directed. Please refer to the Current Medication list given to you today.  *If you need a refill on your cardiac medications before your next appointment, please call your pharmacy*  Lab Work: None ordered  If you have labs (blood work) drawn today and your tests are completely normal, you will receive your results only by: MyChart Message (if you have MyChart) OR A paper copy in the mail If you have any lab test that is abnormal or we need to change your treatment, we will call you to review the results.  Testing/Procedures: None ordered  Follow-Up: At [facility], you and your health needs are our priority.  As part of our continuing mission to provide you with exceptional heart care, our providers are all part of one team.  This team includes your primary Cardiologist (physician) and Advanced Practice Providers or APPs (Physician Assistants and Nurse Practitioners) who all work together to provide you with the care you need, when you need it.  Your next appointment:   9 month(s)  Provider:   Luana Rumple, MD    We recommend signing up for the patient portal called "MyChart".  Sign up information is provided on this After Visit Summary.  MyChart is used to connect with patients for Virtual Visits (Telemedicine).  Patients are able to view lab/test results, encounter notes, upcoming appointments, etc.  Non-urgent messages can be sent to your provider as well.   To learn more about what you can do with MyChart, go to [email or web address].   Other Instructions

## 2023-06-30 ENCOUNTER — Encounter: Payer: Self-pay | Admitting: Cardiovascular Disease

## 2023-06-30 NOTE — Progress Notes (Signed)
 Patient ID: Clayton [name], male   DOB: [birth year], 78 y.o.   MRN: [id number]        HPI: Clayton [name] is a 78 y.o. male who presents for a 4 month follow-up cardiology evaluation.  [name] has a history of hypertension, a remote history of PAF, status post cardioversion in 2007, obstructive sleep apnea for which he uses CPAP 100% of the time, hypertension, and hyperlipidemia. He has had weight fluctuations over the years.  In the past, he also had a history of Mnire's disease.  There was some concern by [name] that perhaps this may have been related to statin use, which led to its discontinuance.  He is not had Mnire's disease in some time.  He underwent left hip replacement surgery by Dr. Bernard Brick and now has been able to continue to be active and exercises routinely.   His  blood pressure regimen has been amlodipine /benazepril  10/40 daily, hydrochlorothiazide  has been taking 25 Mill grams in the evening and Toprol -XL 100 mg daily. He had atrial of livalo   For hyperlipidemia but developed some diarrhea secondary to this.    He has not had much success with weight loss over the past several years.    He continues to use his CPAP therapy with 100% compliance.  He will not even take a nap without his CPAP therapy and he takes his CPAP unit on alll his travels.   Since initiating CPAP therapy, he is unaware of any palpitations.  He denies any recurrent atrial fibrillation.  In the past he has had difficulty with family stress and his father had passed away, and his wife's mother recently died after having developed lung CA and had significant PVD.  He was not exercising as much as he had in the past.  He has not been successful in weight loss.  He continues to be active and still walks 3-4 miles per day.  Oftentimes in the morning when he awakens his blood pressure is elevated at 150/90 but after exercise it drops to 128/80.  He is unaware of any recurrent atrial fibrillation.  He  continues to use CPAP with 100% compliance.   He completed a two-year grieving process.  On 01/19/2016 he committed to weight loss.  On January 1, he weighed 278 pounds .  He has been walking 4 miles 5 days per week as well as some intermittent stationary bike.  He has lost a total of 32 pounds since January.  He continues to use CPAP with 100% compliance and since he has been on CPAP, he has not had any recurrent atrial fibrillation and his blood pressure has been less labile.  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  He has had failures to Crestor  and Lipitor in the past due to significant myalgias, development, even with weekly dosing.  I resumed zetia    In August 2018 he noticed his heart rate increasing and he presented for evaluation and was seen by [name], PAC.  He admitted to a rare palpitation.  He denied any chest pain with exertion or dyspnea on exertion.  He has suffered a broken rib.  This year.  He has a weight goal of 220 pounds.    I saw him in September 2018, at which time he was doing well from a cardiovascular standpoint.   His father was ill and ultimately passed away in [place].  As result he was going back and forth to the [facility] area.  This resulted in a change in his diet and ability to exercise.  I last saw him in October 2019 and his weight had increased from 240 back up to 270 pounds.  He was unaware of any episodes of atrial fibrillation.  He was using CPAP with 100% compliance along with his naps.  He was committed to begin weight loss again.    Since I saw him on October 19, 2018 he has had major lifestyle change.  He has been working with [name] in the healthy weight and wellness group.  Peak weight was 280 and most recent weight 244.  I saw him for a cardiology evaluation on August 09, 2018.  O ver the past 4 months, he had been under increased stress as result of his wife's illness.  He has been checking his blood pressure regularly and this has  been stable but on his blood pressure recording he has noticed the message of heart rate irregularity.  He is unaware of A. fib but has noticed this message for the last 3 to 4 months.  He is exercising every day and typically walks 2 miles in the morning and in the afternoon or evening does 30 minutes of stationary bike at least 5 to 6 days/week.  He continues to use CPAP.  I obtained a download in the office today from June 22 through August 08, 2018.  He is 100% compliant.  He has a ResMed air sense 10 auto unit with a pressure range of 8-20 with 95% pressure at 10.1 and maximum average and 11.5.  AHI is excellent at 1.2.     During his August 09, 2018 encounter his ECG demonstrated that he was back in atrial fibrillation and had a ventricular rate at 89 bpm.  There was LVH with repolarization changes.  At that time, I recommended discontinuance of amlodipine /benazepril  combination and in its place started Cardizem  CD 180 mg rather than amlodipine  and olmesartan  20 mg rather than benazepril .  I initiated anticoagulation with Eliquis  5 mg twice a day.  He underwent a 2D echo Doppler study on August 16, 2018 which showed EF low normal at 50 to 55%.  There was evidence for significant LVH and moderate dilation of his left atrium.  I saw him for follow-up evaluation on August 23, 2018.  At that time he was now cognizant of his heart rate irregularity.  He has continued to use CPAP with excellent compliance.  A new download was obtained from July 5 through August 21, 2018 which shows 100% compliance with average usage 7 hours and 28 minutes.  AHI is 1.6 and his 95th percentile auto pressure is 10.1 with a maximum average pressure of 11.4.  He states he has lost weight over the past several weeks purposefully.  He continues to exercise now on a stationary bike.  He denies any chest pain PND orthopnea.  He was tolerating Eliquis  without bleeding.  During that evaluation, we discussed different options including several  additional weeks of increased weight control versus initiating antiarrhythmic therapy.  Since his only other episode of atrial fibrillation previously occurred 13 years ago and he had not had any recurrence until this year he opted for an increased rate control trial.  As result metoprolol  dose was further titrated.  I saw on September 15, 2018 for follow-up evaluation.  At that time remained in atrial fibrillation despite his increase metoprolol  succinate to 100 mg in the morning and 50 mg at night with continuation of  diltiazem  180 mg, spironolactone  12.5 mg daily in addition to his olmesartan  20 mg.  I scheduled him for DC cardioversion but because of the need to obtain a COVID test I was unable to schedule this to be done by me prior to going on vacation.  He ultimately underwent the successful cardioversion by Dr. Lucy Sack successfully and received 1 shock of 200 J of biphasic synchronized rhythm with restoration of sinus rhythm.  Chemistry profile done prior to the cardioversion revealed his creatinine had increased to 1.69 and as result I recommended he reduce his olmesartan  down to 10 mg from his dose of 20 mg.  I saw him on October 10, 2018 in follow-up of his cardioversion.  At that time he was maintaining sinus rhythm and feeling well.  He had more energy.  His stress level had significantly reduced since his wife had successful L5-S1 back surgery by Dr. Nigel Bart.  He was continuing to use CPAP with 100% compliance.    I evaluated him on January 09, 2019.  At that time he stated that over the 3 months previous he had continued to feel well and remained asymptomatic.  At times there are still periods of increased stress.  He continued to use CPAP with 100% compliance.  A download was obtained from November 22 through January 08, 2019 which showed 100% compliance.  Average usage was 6 hours 47 minutes.  AHI is excellent at 1.4 with his AutoSet CPAP minimum pressure set at 8 and maximum of 20, with 95th  percentile pressure 10.2 with maximum average pressure 12.0.  During his evaluation, his ECG verify that he was back in atrial fibrillation.  At the time, he did not inform me that he had noted some irregularity but retrospectively this may have been going on for several weeks prior to that evaluation.  He was continuing to walk daily and exercising on his bike and was asymptomatic without shortness of breath.  During that evaluation I had a long discussion with him regarding possible EP evaluation for consideration of ablation.  After his significant discussion elected to initiate an attempt at antiarrhythmic therapy with low-dose flecainide  initially at 50 mg twice a day with plans for office visit in 2 weeks.  He has continued to be on Eliquis  for anticoagulation in addition to Zetia  for his hyperlipidemia.  When I saw him on January 24, 2019 he stated that he had felt some fatigue once flecainide  was instituted.  He was feeling well but this past Sunday he had been working very hard going up and down numerous steps carrying boxes with heavy exertion making at least 40 trips carrying Christmas girls back upstairs.  He was tired.  Sunday evening while sitting down he became disoriented anxious and it appeared that there was possibly some transient stress of aphasia.  He denied any focal weakness.  His symptoms ultimately resolved after 30 minutes.  He had called the office during the workweek and was concerned about his symptoms possibly secondary to flecainide .  He had self reduced his flecainide  dose to just once a day rather than twice a day and is worked into my schedule today for follow-up evaluation.  Presently, he has no residual issues since that event several days ago.    At his January 2021 evaluation, I recommended he undergo a neurologic evaluation with Dr. Janett Medin as well as discussed an EP evaluation for possible consideration of future atrial fibrillation ablation.  He saw Dr. Lawana Pray on February 08, 2019  and was in persistent atrial fibrillation.  Was discussion concerning continuing flecainide  with subsequent high voltage cardioversion.  He saw Dr. Janett Medin February 27, 2019 and after a long evaluation it was felt that his transient episode of word finding difficulty and anomia likely represented mild cognitive impairment rather than TIA or seizure.  He subsequently saw Dr Lawana Pray back on March 05, 2019 and on 323 he underwent successful cardioversion with Dr. Theodis Fiscal which required 3 shocks with restoration of sinus rhythm.  He was evaluated by me in March 2021 at which time he felt his heart rhythm had remained stable.    He was under considerable increased stress to his wife had fallen in sustained a fracture to her humerus as well as tear to her rotator cuff and biceps tendon.  Ul diltiazem  was discontinued and he was started on amlodipine  5 mg and was told to continue olmesartan  20 mg in addition to his metoprolol  and spironolactone .  Timately she underwent surgery.  He denies chest pain PND orthopnea.  He has continued to use CPAP download from March 05, 2019 through April 03, 2019 continues to show excellent compliance with an AHI of 2.7 and 95th percentile pressure 11.4 cm.   He was evaluated by [facility] June 20, 2019.  He denies chest pain or shortness of breath.  He had noted some elevated blood pressures as well as some dyspnea on exertion.  During that evaluation his diltiazem  was discontinued and he was started on amlodipine  5 mg.  He was told to continue his present dose of olmesartan , metoprolol , spironolactone .  During that evaluation he was maintaining sinus rhythm but had bradycardia and first-degree heart block.  Flecainide  was reduced down to 50 mg twice a day since his symptoms of fatigability and dyspnea seem to occur at the higher dose.  With return to a lower dose his symptoms resolved.  I evaluated him in June 2021 over the prior 3 months he denied any recurrent  episodes of chest pain.    He continues to have issues in the first 10 minutes of his CPAP use which I suspect may be related to not having a ramp time on his machine which can easily be adjusted.  His blood pressure does continue to be somewhat labile but he states at home most of the time it is controlled.  He was unaware of any recurrent episodes of atrial fibrillation.  He denied presyncope or syncope.    He underwent a nuclear stress test which apparently had been ordered for dyspnea on exertion.  This was low risk study and demonstrated a hypertensive response to exercise.  A defect was felt to be present in the inferior wall which was interpreted as scar with mild peri-infarct ischemia, which may also have been exacerbated by his body habitus and diaphragmatic attenuation.  Currently he remains asymptomatic and denies chest pain or shortness of breath.  He has been evaluated by Dr. Memory Staggers for left cochlear Mnire's disease.  He underwent audiogram which showed mild to moderate severity high-frequency sensorineural neural hearing loss in the right ear and mild to moderately severe downsloping sensorineural neural hearing loss in the left ear.   I saw him in October 2021 at which time he denied any recurrent episodes of atrial fibrillation.  He  continued to be on amlodipine  5 mg, metoprolol  XL 100 mg in the evening with 25 or 50 mg in the morning, olmesartan  40 mg, and spironolactone  12.5 mg daily.  He was on  Eliquis  for anticoagulation.  He wastolerating Livalo  2 mg and Zetia  for hyperlipidemia with LDL cholesterol at 55 in June 2021.  He tries to play golf several times a week if possible and continues to score well.  He continues to be followed by Dr. Jasmine Mesi with plans for continued weight loss.  I saw him in February 2022 and since his prior evaluation he was having issues with his knees.   He had undergone removal of his meniscus.  He has been followed by [facility] he recently had a  hyaluronic acid injection with benefit.  He also has undergone evaluation with Dr. Deatra Face with discomfort in his right hip.  He has low back issues at L5-S1 and is now on gabapentin .  He is unaware of recurrent atrial fibrillation.  Apparently he is was only taking flecainide  50 mg daily instead of twice a day.  He continued to be on amlodipine  7.5 mg, Eliquis  5 mg twice a day, metoprolol  succinate 150 mg in the evening and spironolactone  12.5 mg daily.  At times he he has some mild blood pressure elevation.  He continued to be evaluated at healthy weight and wellness.  Because of his knee and hip issues he has not been playing as much golf as he had in the past.  He continuesd to use CPAP with 100% compliance. A download was obtained from January 20 through March 07, 2020.  He is averaging 7 hours and 2 minutes of CPAP use per night.  AHI is 1.5 with a 95th percentile pressure at 11.5 and maximum average pressure at 13.  During that evaluation, his QTc interval was prolonged at 497 and rather than further increase flecainide  I recommended titration of metoprolol  to 50 mg in the morning and 100 mg at night.  He continued to be on Livalo  2 mg daily and Zetia  for hyperlipidemia.  I saw him on July 17, 2020.  Since his prior evaluation he developed an episode of recurrent atrial fibrillation and was seen by Friddie Jetty, NP on June 27, 2020 and was scheduled to see Dr. Lawana Pray on June 30, 2020.  During that evaluation, his flecainide  was discontinued and amiodarone  400 mg twice a day was initiated for 2 weeks with plans to reduce to 400 mg for 2 weeks and then 200 mg daily.  He was scheduled to undergo DC cardioversion on July 14 2020 and upon arrival for the procedure he was found to have converted to sinus rhythm and cardioversion was not performed.  When seen by Dr. Lawana Pray, the patient had already a scheduled right hip replacement surgery to be done by Dr. Bernard Brick on August 12, 2020.  Dr. Lawana Pray has scheduled  Mr. Vanwey to undergo cardioversion in late August 2022.  He was using CPAP with 100% compliance with AHI of 1.7 and 95th percentile pressure 10.7 with maximum average pressure 12.7 centimeters of water .  He underwent CT cardiac morphology/pulmonary vein prior to his ablation.  Calcium  score was 42.7 Aggrastat units which is the 25th percentile for age, race and sex matched control.  He had an upper normal ascending aorta size at 3.9 cm.  He underwent successful atrial fibrillation ablation with Dr. Lawana Pray on September 02, 2020.  He saw him in follow-up on December 16, 2020 and was maintaining sinus rhythm and was taken off flecainide .  I saw him on January 22, 2021 at which time he was maintaining sinus rhythm and continued to be on  Eliquis  5 mg twice a  day.  He has been on amlodipine  7. 5 mg daily, metoprolol  succinate 50 mg twice a day, olmesartan  40 mg daily in addition to spironolactone  12.5 mg daily for hypertension.  He is on Livalo  2 mg with Zetia  10 mg for hyperlipidemia.  He continues to see [name] at healthy weight and wellness program.  He has resumed his activity and is walking 2 miles in the morning and riding a stationary bike for 30 minutes later in the afternoon.    He underwent an echo Doppler study on July 24, 2021 which showed EF 50 to 55% (54.2% on biplane).  He had moderate left atrial dilatation.  There was mild dilation of his aortic root at 43 mm and aortic arch at 45 mm.  He has been seen by Dr. Lawana Pray and more recently by Pilar Bridge, PA-C.  He was noted to have very brief SVT on a long-term monitor.  He has continued to use CPAP and a download from September 7 through October 23, 2021 showed average use at 7 hours and 37 minutes per night.  AHI was excellent at 0.9 with his pressure range 6 to 8 to 20 cm of water .  He had never had a colonoscopy recently had a Cologuard test recommended by his primary physician which was positive.  He subsequently underwent colonoscopy by  Dr. Bernabe Brew and a small mass was detected in the colon with biopsy positive for malignancy.  He is undergoing further evaluation with CT imaging with plans to for surgical consultation.    I saw him on December 02, 2021. Since his prior evaluation, he underwent CT imaging and no findings of metastatic disease in the chest abdomen or pelvis was detected.  He is tentatively scheduled to undergo surgery on December 4 with Dr. Beatris Lincoln for resection of his colonic mass.  Presently, he continues to feel well.  He denies any chest pain or shortness of breath.  He continues to be on amlodipine  5 mg, metoprolol  succinate 100 mg twice a day, olmesartan  40 mg, and spironolactone  12.5 mg daily for hypertension.  He is on Eliquis  anticoagulation and denies bleeding.  He is on Zetia /Livalo  for hyperlipidemia.  He has been taking over-the-counter ferrous sulfate .  He continues to use CPAP with 100% compliance.  CPAP setting is 8 to 20 cm of water  and AHI is excellent at 0.7 with 95th percentile pressure 10.5 with maximum average pressure 11.9 cm of water .   Since his last evaluation, he underwent successful right hemicolectomy for ascending colon adenocarcinoma on December 21, 2021 by Dr. Beatris Lincoln.  Surgery was uncomplicated and tumor margins were negative.  He has subsequently been seen by Dr. Dominic Friendly of GI March 08, 2022 and was doing well.  He is now trying to get back in shape.  He has had some difficulty with his left knee and has been using a stationary bike rather than walking.  He underwent biopsy and resection of a skin lesion on his neck which was squamous cell CA.   I saw him on April 12, 2022.  He felt well and denied any chest pain, palpitations, or awareness of any recurrent atrial fibrillation.   He admits to some occasional ankle swelling.  He continues to use CPAP with 100% compliance and has been on CPAP for 17 years.  A download was obtained from February 24 through April 11, 2022 which  shows 100% compliance with average use at 7 hours and 15 minutes.  AHI is excellent  at 1.3.  His CPAP is set at a pressure range of 8 to 20 cm.  95th percentile pressure was 10.8 with maximum average pressure 13.0.  Presently he is on amlodipine  7.5 mg, metoprolol  succinate which he takes 50 mg twice a day, spironolactone  25 mg alternating with 12.5 mg on Monday Wednesday and Friday.  He is on Zetia  10 mg and Livalo  2 mg daily.  He is on metformin  500 mg daily.  During that evaluation, he had 1+ ankle edema and his blood pressure was elevated with stage II hypertension.  I recommended further titration of amlodipine  to 10 mg and increase spironolactone  to 25 mg alternating with 12.5 mg every other day.  He apparently had been taken off olmesartan  previous to my evaluation which I suspect was in December 2023 when his creatinine was increased at 1.62.  I recommended slight titration of metoprolol  succinate from 50 mg twice a day to 75 mg in the morning and 50 mg at night and follow-up laboratory was recommended.  When I saw him on Jun 04, 2022 he felt well.  He was continuing to be active and has been spreading mulch in his yard.  He typically notes his resting heart rate in the 90s.  He is unaware of any recurrent atrial fibrillation.  He continues to use CPAP at night [employer] is now his DME company.  I obtained a download in the office today which shows excellent use with average use at 7 hours and 40 minutes per night.  His CPAP is set at a pressure range of 8 to 20 cm of water  and AHI is 1.4.  95th percentile pressure is 11.2 with maximum average pressure of 12.6.  His ECG showed normal sinus rhythm at 94 bpm with right bundle branch block and left anterior hemiblock.  His blood pressure continued to be elevated and I recommended titration of metoprolol  succinate to 100 mg in the morning and 75 mg in the evening.  He was evaluated by Ervin Heath, PA on August 04, 2022 after his blood pressure machine the night  before suggested possible irregular rhythm.  When seen by Ervin Heath, he was maintaining sinus rhythm and did not have recurrent AF.  His average blood pressure at home systolic was 125 mmHg.  I last saw him on November 09, 2022 at which time he continued to feel well.  He states blood pressure at home typically is in the 120s over 70s.  He now is wearing a left hearing aid.  He does have some periods of blood pressure lability.  He has internal hemorrhoids and did have bleeding noted for short duration and he self held Eliquis  for 3 days.  He is now back on Eliquis  5 mg twice a day.  He continues to be on amlodipine  10 mg, metoprolol  succinate 100 mg in the morning and 75 mg in the evening in addition to spironolactone  25 mg daily.  He is on PET-avid statin 2 mg for hyperlipidemia which she tolerates well in addition with Zetia  10 mg.  He continues to use CPAP and his DME company is [employer].  I obtained a download from September 21 through November 07, 2022.  He continues to be compliant with usage averaging 7 hours and 21 minutes on days used.  His pressure set at a range of 8 to 20 cm, and his 95th percentile pressure is 11.0 with maximum average pressure 13.3.  AHI is 1.5.  During that evaluation I made slight modification  to his CPAP setting and changed his settings to a range of 9 to 18 cm of water .  I last saw him on March 15, 2023.  He continued to feel well and was unaware of any recurrent atrial fibrillation.   He has had issues with his left knee and yesterday received a cortisone shot.  He has been having periodic treatment with hyaluronic acid.  He admits to weight gain and plans significant weight loss of at least 1 pound every week with plan 52 pound weight loss over the year.  Current weight is 268 and he has set a goal to get down to 220.  He continues to be on amlodipine  10 mg, Eliquis  5 mg twice a day, spironolactone  25 mg daily, in addition to metoprolol  succinate which she has been taking 100  mg in the morning and 50 mg in the evening.  His blood pressure recently has been elevated.  Apparently he had been taken off olmesartan  following one of his surgeries.  He is on Zetia  in addition to atorvastatin for lipid management and had issues with other statins.  He continues to use CPAP with 100% compliance.  Download was obtained in the office today from January 2060 March 14, 2023 which demonstrates 100% use and average use of 7 hours and 55 minutes.  AHI is excellent at 1.8 on his current settings of 9 to 18 cm of water .  He continues to see Dr. Marylee Snowball for primary care.    Since I last saw him, he has continued to feel well.  He continues to play golf.  He is asymptomatic with reference to chest pain or shortness of breath.  He he plays golf regularly.  He continues to be on amlodipine  10 mg, metoprolol  succinate 100 mg in the morning and 50 mg at night, olmesartan  20 mg and spironolactone  25 mg daily.  He is on Zetia  and PET-avid statin 2 mg for lipid management.  He continues to be on amiodarone  20 mg daily and is on Eliquis  for anticoagulation.  He is diabetic on metformin  500 mg.  He has a history of Gilbert's disease.  He sees Ronnell Coins and has been undergoing knee injection with benefit.  He admits to some anxiety and wakes up sometimes with a knot in his stomach.  Over time this abates and he feels well.  He continues to use CPAP therapy with [employer] as his DME company.  He continues to be compliant and most recent download shows AHI 1.2 with his pressure range at 9 to 18 cm of water .  He presents for evaluation.  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 Meredeth Stallion; CPAP titration 11/12/04- Dr. Meredeth Stallion   Squamous cell carcinoma of skin of neck    right; 2023    Past Surgical  History:  Procedure Laterality Date   ATRIAL FIBRILLATION ABLATION N/A 09/10/2020   Procedure: ATRIAL FIBRILLATION ABLATION;  Surgeon: Lei Pump, MD;  Location: [facility] INVASIVE CV LAB;  Service: Cardiovascular;  Laterality: N/A;   CARDIAC CATHETERIZATION  11/03/2005   CARDIOVERSION N/A 09/26/2018   Procedure: CARDIOVERSION;  Surgeon: Harrold Lincoln, MD;  Location: [facility] ENDOSCOPY;  Service: Endoscopy;  Laterality: N/A;   CARDIOVERSION N/A 03/13/2019   Procedure: CARDIOVERSION;  Surgeon: Maudine Sos, MD;  Location: [facility] ENDOSCOPY;  Service: Cardiovascular;  Laterality: N/A;   CARDIOVERSION N/A 03/03/2021   Procedure: CARDIOVERSION;  Surgeon: Loyde Rule, MD;  Location: [facility] ENDOSCOPY;  Service: Cardiovascular;  Laterality: N/A;   CATARACT EXTRACTION  01/19/2012   KNEE ARTHROSCOPY     left knee- 2002    LAPAROSCOPIC PARTIAL RIGHT COLECTOMY Right 12/21/2021   Procedure: LAPAROSCOPIC RIGHT HEMICOLECTOMY;  Surgeon: Melvenia Stabs, MD;  Location: [facility] ORS;  Service: General;  Laterality: Right;   TOTAL HIP ARTHROPLASTY  06/08/2011   Procedure: TOTAL HIP ARTHROPLASTY ANTERIOR APPROACH;  Surgeon: Bevin Bucks, MD;  Location: [facility] ORS;  Service: Orthopedics;  Laterality: Left;   TOTAL HIP ARTHROPLASTY Right 08/12/2020   Procedure: TOTAL HIP ARTHROPLASTY ANTERIOR APPROACH;  Surgeon: Claiborne Crew, MD;  Location: [facility] ORS;  Service: Orthopedics;  Laterality: Right;    No Known Allergies   Current Outpatient Medications  Medication Sig Dispense Refill   amiodarone  (PACERONE ) 200 MG tablet Take 200 mg by mouth.      amLODipine  (NORVASC ) 10 MG tablet TAKE 1 TABLET BY MOUTH EVERY DAY 90 tablet 3   Coenzyme Q10 200 MG capsule Take 200 mg by mouth at bedtime.     cyanocobalamin (VITAMIN B12) 100 MCG tablet Take 500 mcg by mouth daily. Patient takes a gummie     ezetimibe  (ZETIA ) 10 MG tablet TAKE 1 TABLET (10 MG TOTAL) BY MOUTH IN THE MORNING 90 tablet 3   ferrous sulfate  325 (65 FE) MG EC tablet Take 325 mg by mouth in the morning and at bedtime.     metFORMIN  (GLUCOPHAGE ) 500 MG tablet TAKE 1 TABLET BY MOUTH EVERY DAY WITH BREAKFAST 90 tablet 1   metoprolol  succinate (TOPROL -XL) 50 MG 24 hr tablet TAKE 100 MG (2 TABLETS) BY MOUTH IN THE MORNING, AND 75 MG (1.5 TABLETS) IN THE EVENING. 360 tablet 3   Multiple Vitamins-Minerals (MULTI-VITAMIN/MINERALS PO) Take by mouth daily. Takes two in the AM     olmesartan  (BENICAR ) 20 MG tablet Take 1 tablet (20 mg total) by mouth daily. 90 tablet 3   Pitavastatin  Calcium  2 MG TABS TAKE 1 TABLET BY MOUTH EVERY DAY IN THE EVENING 90 tablet 3   spironolactone  (ALDACTONE ) 25 MG tablet Take 1 tablet (25 mg total) by mouth daily. 90 tablet 3   tamsulosin  (FLOMAX ) 0.4 MG CAPS capsule Take 0.4 mg by mouth in the morning.     triamcinolone  cream (KENALOG ) 0.5 % APPLY 1 APPLICATION TOPICALLY 2 (TWO) TIMES DAILY. TO AFFECTED AREAS. 30 g 3   Zinc Sulfate (ZINC 15 PO) Take 30 mcg by mouth. Patient takes a gummy     apixaban  (ELIQUIS ) 5 MG TABS tablet Take 1 tablet (5 mg total) by mouth 2 (two) times daily. 180 tablet 1   No current facility-administered medications for this visit.    Socially he is remarried after his first wife passed away. He has 2 children. He is retired  and was the Charity fundraiser at [employer]. there is no tobacco history.  He plays golf at least 2 days/week and this past year not only did he have a hole in 1 but he has also shot his age.  ROS General: Negative; No fevers, chills, or night sweats;  HEENT: Negative; No changes in vision or hearing, sinus  congestion, difficulty swallowing Pulmonary: Negative; No cough, wheezing, shortness of breath, hemoptysis Cardiovascular: See HPI GI: Recent colonoscopy with colonic mass; status post right hemicolectomy for ascending colon adenocarcinoma GU: Negative; No dysuria, hematuria, or difficulty voiding Musculoskeletal: He is status post rib fracture.  He is status post hip replacement.  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 Psychiatric: Negative; No behavioral problems, depression Sleep: Positive for obstructive sleep apnea on CPAP therapy with 100% compliance No snoring, daytime sleepiness, hypersomnolence, bruxism, restless legs, hypnogognic hallucinations, no cataplexy Other comprehensive 14 point system review is negative.  PE BP (!) 138/96   Pulse 72   Ht 6' 3 (1.905 m)   Wt 274 lb (124.3 kg)   SpO2 96%   BMI 34.25 kg/m    Repeat blood pressure by me was improved at 136/86  Wt Readings from Last 3 Encounters:  06/28/23 274 lb (124.3 kg)  06/08/23 278 lb (126.1 kg)  05/20/23 270 lb (122.5 kg)  In 2021 he had lost approximately 40 pounds  General: Alert, oriented, no distress.  Skin: normal turgor, no rashes, warm and dry HEENT: Normocephalic, atraumatic. Pupils equal round and reactive to light; sclera anicteric; extraocular muscles intact;  Nose without nasal septal hypertrophy Mouth/Parynx benign; Mallinpatti scale 3 Neck: No JVD, no carotid bruits; normal carotid upstroke Lungs: clear to ausculatation and percussion; no wheezing or rales Chest wall: without tenderness to palpitation Heart: PMI not displaced, RRR, s1 s2 normal, 1/6 systolic murmur, no diastolic murmur, no rubs, gallops, thrills, or heaves Abdomen: soft, nontender; no hepatosplenomehaly, BS+; abdominal aorta  nontender and not dilated by palpation. Back: no CVA tenderness Pulses 2+ Musculoskeletal: full range of motion, normal strength, no joint deformities Extremities: no clubbing cyanosis or edema, Homan's sign negative  Neurologic: grossly nonfocal; Cranial nerves grossly wnl Psychologic: Normal mood and affect    EKG Interpretation Date/Time:  Tuesday June 28 2023 09:10:52 EDT Ventricular Rate:  72 PR Interval:  212 QRS Duration:  154 QT Interval:  408 QTC Calculation: 446 R Axis:   -76  Text Interpretation: Sinus rhythm with 1st degree A-V block Left axis deviation Right bundle branch block When compared with ECG of 15-Mar-2023 07:56, No significant change was found Confirmed by Magnus Schuller ([id number]) on 06/30/2023 2:48:49 PM    March 15, 2023 ECG (independently read by me): Normal sinus rhythm at 83, right bundle branch block.  November 09, 2022 ECG (independently read by me): Normal sinus rhythm at 64 bpm, first-degree AV block with.  PR interval 212 ms; right bundle branch block.  Jun 04, 2022 ECG (independently read by me): NSR at 94, RBBB, LAHB  April 12, 2022 ECG (independently read by me): NSR at 89 RBBB, LAHB   December 02, 2021 ECG (independently read by me):  NSR at 72, RBBB, LAD, QTc 479 msec  October 26, 2021 ECG (independently read by me): NSR at 77; RBBB, LAHB, LVH; QTc 484 msec   January 22, 2021 ECG (independently read by me):  NSR at 76, RBBB: PR 200 msec  July 17, 2020 ECG (independently read  by me): NSR at 70; RBBB, 1st degree AV block; PR 220 msec; QTc 492 msec  March 10, 2020 ECG (independently read by me): NSR at 82; RBBB with repoarization, QTc 497 msec  November 06, 2019 ECG (independently read by me): Sinus rhythm at 64; 1st degree AV block; PR 212 msec;RBBB  July 09, 2019 ECG (independently read by me): Normal sinus rhythm at 70 bpm, first-degree AV block with a.  PR interval at 212 ms.  Right bundle branch block with repolarization changes.   Borderline LVH by voltage criteria.  QTc interval 470 ms.  April 04, 2019 ECG (independently read by me): Sinus bradycardia 59 bpm, first-degree AV block with a PR of 248 ms.  LVH.  Inferior Q waves, unchanged  January 24, 2019 ECG (independently read by me): Atrial fibrillation at 83 bpm.  Borderline LVH by voltage criteria in aVL; QTc interval 446 ms.  January 09, 2019 ECG (independently read by me): Atrial fibrillation ar 97, RBBB; Left axis deviation; Inferior Q waves, unchanged. QTc 487    August 09, 2018 ECG (independently read by me): Normal sinus rhythm at 72 bpm.  Left Axis deviation.  QTc interval 429 ms.  September 15, 2018 ECG (independently read by me): Atrial fibrillation at 92 bpm.  Right bundle branch block with repolarization changes.  August 23, 2018 ECG (independently read by me): Atrial fibrillation at 96 bpm.  LVH with repolarization changes.  Left axis deviation.  Left anterior hemiblock.  Inferior Q waves  August 09, 2018 ECG (independently read by me):Atrial fibrillatoion at 55; LVH with repolarization  October 219 ECG (independently read by me): Normal sinus rhythm at 81 bpm.  Nonspecific intraventricular conduction delay.  Left axis deviation  September 2018 ECG (independently read by me): Normal sinus rhythm at 63 bpm.  Left bundle branch block with repolarization changes.  Normal intervals.  No ectopy.  April 2018 ECG (independently read by me): Normal sinus rhythm at 68 bpm.  LVH with repolarization.  Normal intervals.  November 2017 ECG (independently read by me): Normal sinus rhythm at 78 bpm.  Nonspecific interventricular delay.  QRS complex V1 V2.  August 2016 ECG (independently read by me): Normal sinus rhythm at 77 bpm.  LVH with repolarization changes.  Left axis deviation.  December 2015 ECG (independently read by me): Normal sinus rhythm at 87 bpm.  LVH by voltage criteria with T-wave changes in leads 1 and aVL.  July 2014 ECG: Normal sinus rhythm at 76 beats  per minute. LVH with QRS widening.  LABS:     Latest Ref Rng & Units 06/08/2023    8:17 AM 05/20/2023    9:39 AM 06/29/2022    9:18 AM  BMP  Glucose 70 - 99 mg/dL  469  629   BUN 6 - 23 mg/dL  26  22   Creatinine 5.28 - 1.50 mg/dL  4.13  2.44   BUN/Creat Ratio 6 - 22 (calc)   SEE NOTE:   Sodium 135 - 145 mEq/L  139  136   Potassium 3.5 - 5.1 mEq/L  5.0  4.4   Chloride 96 - 112 mEq/L  105  104   CO2 19 - 32 mEq/L  27  20   Calcium  8.6 - 10.3 mg/dL 01.0  27.2  9.9    9.9       Latest Ref Rng & Units 06/08/2023    8:17 AM 05/20/2023    9:39 AM 06/29/2022    9:18 AM  Hepatic  Function  Total Protein 6.0 - 8.3 g/dL 6.8  7.1  6.7   Albumin  3.5 - 5.2 g/dL 4.6  4.9    AST 0 - 37 U/L 24  29  21    ALT 0 - 53 U/L 34  36  26   Alk Phosphatase 39 - 117 U/L 58  58    Total Bilirubin 0.2 - 1.2 mg/dL 0.2 - 1.2 mg/dL 1.2    1.2  1.6  1.3   Bilirubin, Direct 0.0 - 0.3 mg/dL 0.0 - 0.2 mg/dL 0.2    0.3         Latest Ref Rng & Units 05/20/2023    9:39 AM 05/06/2022   10:52 AM 12/24/2021    5:16 AM  CBC  WBC 4.0 - 10.5 K/uL 5.2  5.2  6.9   Hemoglobin 13.0 - 17.0 g/dL 16.1  09.6  04.5   Hematocrit 39.0 - 52.0 % 47.4  46.8  40.7   Platelets 150.0 - 400.0 K/uL 191.0  189  184    Lab Results  Component Value Date   MCV 94.3 05/20/2023   MCV 91 05/06/2022   MCV 89.1 12/24/2021   Lab Results  Component Value Date   TSH 1.73 05/20/2023    Lab Results  Component Value Date   HGBA1C 6.2 05/20/2023    Lipid Panel     Component Value Date/Time   CHOL 136 05/20/2023 0939   CHOL 114 05/06/2022 1052   TRIG 131.0 05/20/2023 0939   HDL 47.80 05/20/2023 0939   HDL 52 05/06/2022 1052   CHOLHDL 3 05/20/2023 0939   VLDL 26.2 05/20/2023 0939   LDLCALC 62 05/20/2023 0939   LDLCALC 50 05/06/2022 1052   LDLCALC 119 (H) 10/12/2016 0902     RADIOLOGY: No results found.  IMPRESSION:  1. Paroxysmal atrial fibrillation (HCC)   2. Primary hypertension   3. OSA on CPAP   4. Chronic diastolic  heart failure (HCC)   5. Status post ablation of atrial fibrillation: September 02, 2020, Dr. Lawana Pray   6. Aortic atherosclerosis (HCC)   7. Mild obesity   8. Secondary hypercoagulable state (HCC)   9. Adenocarcinoma, colon (HCC)   10. Colonic mass: s/p resection 12/21/2021     ASSESSMENT AND PLAN: Mr Probert is a 78 year old gentleman who has a long-standing history of hypertension and remotely had been on amlodipine /benazepril  10/40, HCTZ 25 mg, Toprol -XL 100 mg in addition to spironolactone  12.5 mg daily.  He had had an iisolated episode of atrial fibrillation in 2007 and had undergone cardioversion.  On this regimen his blood pressure had stabilized and consistently was well controlled and he had not had any recurrent episodes of atrial fibrillation for many years.  He had gained significant amount of weight since August 2019.  He initiated a major lifestyle change and worked closely with [name] from [facility] resulting in a greater than 40 pound weight loss in 2021.   When I saw him in late July 2020 he was in atrial fibrillation of questionable duration.  At the time retrospectively  he may have been in A. fib for 3 to 4 months duration.  I gradually titrated his medical regimen and changed his benazepril  to diltiazem  and olmesartan  to take in addition to metoprolol  succinate and his spironolactone  12.5 mg daily.  He had continued to use CPAP with 100% compliance.  His only previous episode of A. fib prior to this episode had occurred in  2007 and he had maintained sinus rhythm until his recurrent episode.  He underwent successful cardioversion on September 26, 2018 with restoration of sinus rhythm.  When I saw him on January 09, 2019 he was continuing to have periods of increased stress and ECG again showed recurrent atrial fibrillation of questionable duration.  Retrospectively he felt he may have been in atrial fibrillation for 3 to 4 weeks duration prior to that visit.  He  also had an episode of disorientation with possible transient expressive aphasia and underwent neurologic evaluation with Dr. Janett Medin and also EP evaluation with Dr. Lawana Pray.  He was felt that his word finding difficulty and anomia most likely represented mild cognitive impairment rather than a TIA or stroke.  He had been on increased dose of flecainide  up to 100 mg twice a day after his subsequent cardioversion. An echo Doppler study in January 2021 showed an EF of 50 to 55% without regional wall motion abnormalities.  When I last saw him in February 2022 he was asymptomatic without chest pain or shortness of breath and was maintaining sinus rhythm.  He subsequently reverted back to atrial fibrillation and his flecainide  dose was discontinued and he was started on amiodarone  initially 400 mg twice a day when seen by Dr. Lawana Pray on June 30, 2020.  He had pharmacologically converted to sinus rhythm when he presented for outpatient DC cardioversion on February 14, 2020.  He underwent right successful hip replacement surgery by Dr. Bernard Brick and also successful atrial fibrillation ablation by Dr. Lawana Pray. He had never undergone prior colonoscopy but was recently found to have a colonic mass noted on colonoscopy after a Cologuard test was positive.  Subsequent CT imaging demonstrated the ascending colonic adenocarcinoma without findings of metastatic disease in the chest, abdomen or pelvis.  He did have probable cholelithiasis.  His echo Doppler study from July 2023 showed EF at 50 to 55% (54.2% by biplane),mild LVH and mild grade 1 diastolic dysfunction.  His left atrium was moderately dilated and there was mild dilatation of his aortic root measuring 43 mm, ascending aorta at 42 mm and aortic arch at 45 mm.  At his November 2023 evaluation, he was given clearance to undergo planned surgery.  He underwent successful right hemicolectomy on December 21, 2021 by Dr. Beatris Lincoln.  Clinically he has done remarkably well with  reference to this.  He also recently had undergone biopsy and resection of small left squamous cell carcinoma of his neck.  At his follow-up visit with me in March 2024 he was no longer on olmesartan .  Blood pressure was elevated and I recommended titration of amlodipine  from 5 to 10 mg, increase spironolactone  to 25 mg alternating with 12.5 mg every other day and slightly increase his beta-blocker regimen to metoprolol  succinate 75 mg in the morning and 50 mg at night.  Follow-up laboratory was recommended.  Metoprolol  was further titrated to 100 mg in the morning and 75 mg in the evening on his Jun 04, 2018 for evaluation.  Since I last saw him he has gained weight and his activity has been less.  He is now planning a weight loss regimen of 1 pound per week with aim to reach 220 pounds again.  His blood pressure today is elevated.  Apparently he had reduced his metoprolol  succinate to 100 in the morning and 50 mg at night and he had been taken off olmesartan  during one of his hospitalizations for his surgery.  On subsequent evaluation, blood pressure was  elevated and I recommended resumption of metoprolol  succinate.  Most recently, he is now taking amlodipine  10 mg, metoprolol  succinate 100 mg in the morning and just 50 mg at night, in addition to olmesartan  20 mg and spironolactone  25 mg daily.  He recently has undergone knee injections hyaluronic acid with Ronnell Coins with benefit.  He sees Dr. Kuneff for primary care.  He does have some issues with anxiety.  He is unaware of recurrent atrial fibrillation and has a follow-up appointment with Dr. Lawana Pray next month.  He continues to be on CPAP therapy for his obstructive sleep apnea and cannot sleep without it.  CPAP is set at a pressure range of 9 to 18 cm of water .  Use is 100%.  When he goes out of town he typically takes his old machine with him so that a download of his current machine will suggest a period of nonuse but usage is 100%.  His most recent  95th percentile pressure is 11.4 with maximum average pressure 12.7.  AHI is 1.2.  He is aware of my imminent retirement.  I will transition him to the cardiology care of Dr. Alvis Ba.  He will see Dr. Micael Adas in 1 year for follow-up sleep evaluation.   Millicent Ally, MD, [facility], ABSM Diplomate, American Board of Sleep Medicine  06/30/2023 2:58 PM

## 2023-07-04 ENCOUNTER — Ambulatory Visit (INDEPENDENT_AMBULATORY_CARE_PROVIDER_SITE_OTHER): Admitting: Psychology

## 2023-07-04 DIAGNOSIS — F411 Generalized anxiety disorder: Secondary | ICD-10-CM | POA: Diagnosis not present

## 2023-07-04 DIAGNOSIS — N401 Enlarged prostate with lower urinary tract symptoms: Secondary | ICD-10-CM | POA: Diagnosis not present

## 2023-07-04 DIAGNOSIS — N5201 Erectile dysfunction due to arterial insufficiency: Secondary | ICD-10-CM | POA: Diagnosis not present

## 2023-07-04 DIAGNOSIS — R3915 Urgency of urination: Secondary | ICD-10-CM | POA: Diagnosis not present

## 2023-07-04 NOTE — Progress Notes (Signed)
 [facility] Counselor/Therapist Progress Note  Patient ID: Clayton [name], MRN: [id number]    Date: 07/04/23  Time Spent: 9:56 am - 10:44  am : 48  Minutes  Treatment Type: Individual Therapy.  Reported Symptoms: anxiety.   Mental Status Exam: Appearance:  Neat and Well Groomed     Behavior: Appropriate  Motor: Normal  Speech/Language:  Clear and Coherent  Affect: Appropriate  Mood: normal  Thought process: normal  Thought content:   WNL  Sensory/Perceptual disturbances:   WNL  Orientation: oriented to person, place, time/date, and situation  Attention: Good  Concentration: Good  Memory: WNL  Fund of knowledge:  Good  Insight:   Good  Judgment:  Good  Impulse Control: Good   Risk Assessment: Danger to Self:  No Self-injurious Behavior: No Danger to Others: No Duty to Warn:no Physical Aggression / Violence:No  Access to Firearms a concern: No  Gang Involvement:No   Subjective:   Clayton [name] participated in the session, in person in the office with the therapist, and consented to treatment Clayton [name] reviewed the events of the past week. Clayton [name] noted an increase in his stressors due to his recent lab results. Clayton [name] noted his efforts to be mindful of his mood and address his current stressor. Clayton [name] noted difficulty engaging in self-care due to this stress but noted his awareness that self-care would manage his stress level. We worked on identifying possible barriers to this and to address barriers. Clayton [name] noted having a follow-up today to get an update regarding his health. Clayton [name] noted that this will help address a barriers to engagement in exercise. Clayton [name] noted that his mindfulness routine often gets interrupted by life. Clayton [name] noted that the life events aren't necessary emergent issues. We discussed creating a routine, in the AM, to engage in exercise and mindfulness exercises. Clayton [name] noted often engaging non-priority tasks in lieu of self-care. We worked on employing the use of phone  reminders, scheduling the activities, use of visual reminders (post-it) to redirect towards self-care, check-ins that tasks have been completed. We discussed the importance of problem-solving and making the necessary changes day-to-day. Clayton [name] noted his interest in keeping a log of his engagement in these activities to be reviewed during our follow-up. Therapist normalized Clayton [name] feelings and experience, provided supportive therapy, and a follow-up was scheduled for continued treatment which Clayton [name] benefits from.    Interventions: CBT   Diagnosis:   Generalized anxiety disorder  Psychiatric Treatment: No , NA  Treatment Plan:  Client Abilities/Strengths Clayton [name] is intelligent, forthcoming, and motivated for change.   Support System: Family  Client Treatment Preferences Outpatient Therapy.   Client Statement of Needs Clayton [name] would like to manage his anxiety, specifically morning anxiety, including engaging in more mindfulness, processing past events, manage day to day stress,  process his anxiety (specifically upon waking), process loss of first wife and the effect of this on his anxiety in relation to his current wife, Clayton [name] (labeled PTSD by Clayton [name]).   Treatment Level Weekly  Symptoms  Anxiety: anxiety, difficulty managing worry, worrying about different things.   (Status: maintained)  Goals:   Clayton [name] experiences symptoms of Anxiety.   Treatment plan signed and available on s-drive:  No, pending patient signature.     Target Date: 07/16/23 Frequency: Weekly  Progress:15% Modality: individual    Therapist will provide referrals for additional resources as appropriate.  Therapist will provide psycho-education regarding Nasir's diagnosis and corresponding treatment approaches and interventions. Licensed Clinical Social Worker, [place], Clayton [name] will  support the patient's ability to achieve the goals identified. will employ CBT, BA, Problem-solving, Solution Focused, Mindfulness,   coping skills, & other evidenced-based practices will be used to promote progress towards healthy functioning to help manage decrease symptoms associated with his diagnosis.   Reduce overall level, frequency, and intensity of the feelings of depression, anxiety  evidenced by decreased overall symptoms from 6 to 7 days/week to 0 to 1 days/week per client report for at least 3 consecutive months. Verbally express understanding of the relationship between feelings of depression, anxiety and their impact on thinking patterns and behaviors. Verbalize an understanding of the role that distorted thinking plays in creating fears, excessive worry, and ruminations.  Clayton [name] participated in the creation of the treatment plan)    Belva Boyden, Clayton [name]

## 2023-07-12 DIAGNOSIS — G4733 Obstructive sleep apnea (adult) (pediatric): Secondary | ICD-10-CM | POA: Diagnosis not present

## 2023-07-15 DIAGNOSIS — G4733 Obstructive sleep apnea (adult) (pediatric): Secondary | ICD-10-CM | POA: Diagnosis not present

## 2023-07-18 NOTE — Progress Notes (Unsigned)
 [name] [address] [place] [address] Phone: [phone] Subjective:   ISusannah [name], am serving as a scribe for Dr. Arthea Claudene.  I'm seeing this patient by the request  of:  Kuneff, Renee A, DO  CC: Back and neck pain  YEP:Dlagzrupcz  Clayton [name] is a 78 y.o. male coming in with complaint of back and neck pain. OMT on 06/09/2023. Patient states talk about L knee and thyroid . Everything else is about the same.            Reviewed prior external information including notes and imaging from previsou exam, outside providers and external EMR if available.   As well as notes that were available from care everywhere and other healthcare systems.  Past medical history, social, surgical and family history all reviewed in electronic medical record.  No pertanent information unless stated regarding to the chief complaint.   Past Medical History:  Diagnosis Date   Aortic atherosclerosis (HCC) 12/21/2021   Arthritis    hips    Atrial fibrillation (HCC)    Cataract    Chronic diastolic heart failure (HCC) 12/21/2021   Cochlear Meniere syndrome of left ear 06/09/2015   CPAP (continuous positive airway pressure) dependence    Dysrhythmia    PAF- hx of 7 years ago    Gilbert's disease    HLD (hyperlipidemia)    Hypertension 05/29/2010   ECHO-EF 67% wnl   Meniere disease    Obesity    OSA on CPAP 08/20/2012   Palpitations    S/P right total hip arthroplasty 08/12/2020   Sleep apnea 09/14/2004   [facility]- Dr. fernand; CPAP titration 11/12/04- Dr. fernand   Squamous cell carcinoma of skin of neck    right; 2023    No Known Allergies   Review of Systems:  No headache, visual changes, nausea, vomiting, diarrhea, constipation, dizziness, abdominal pain, skin rash, fevers, chills, night sweats, weight loss, swollen lymph nodes, joint swelling, chest pain, shortness of breath, mood changes. POSITIVE muscle aches, body  aches  Objective  Blood pressure 136/88, pulse 88, height 6' 3 (1.905 m), weight 268 lb (121.6 kg), SpO2 96%.   General: No apparent distress alert and oriented x3 mood and affect normal, dressed appropriately.  HEENT: Pupils equal, extraocular movements intact  Respiratory: Patient's speak in full sentences and does not appear short of breath  Cardiovascular: No lower extremity edema, non tender, no erythema  Gait MSK:  Back does have some loss lordosis noted.  Some tenderness to palpation of the paraspinal musculature.  Osteopathic findings  C6 flexed rotated and side bent left T3 extended rotated and side bent right inhaled rib T9 extended rotated and side bent left L2 flexed rotated and side bent right L3 flexed rotated and side bent left Sacrum right on right       Assessment and Plan:  Degenerative disc disease, lumbar Continues to have low back pain.  Patient seems to be doing relatively well showing still concern with patient's hypercalcemia so would like to check again to see if anything is elevated.  Increase activity slowly otherwise.  Discussed icing regimen.  Does have known arthritic changes of the knees that we will need to continue to monitor.  Continue to work on core strength and try to get weight down which likely he has lost 6 pounds since we have seen him last.  Would like BMI under 30 follow-up again in 6 to 8 weeks  Hypercalcemia Rechecking labs including PTH    Nonallopathic problems  Decision today to treat with OMT was based on Physical Exam  After verbal consent patient was treated with HVLA, ME, FPR techniques in cervical, rib, thoracic, lumbar, and sacral  areas  Patient tolerated the procedure well with improvement in symptoms  Patient given exercises, stretches and lifestyle modifications  See medications in patient instructions if given  Patient will follow up in 4-8 weeks    The above documentation has been reviewed and is accurate  and complete [name], DO          Note: This dictation was prepared with Dragon dictation along with smaller phrase technology. Any transcriptional errors that result from this process are unintentional.

## 2023-07-20 ENCOUNTER — Encounter: Payer: Self-pay | Admitting: Family Medicine

## 2023-07-20 ENCOUNTER — Ambulatory Visit: Admitting: Family Medicine

## 2023-07-20 ENCOUNTER — Ambulatory Visit: Payer: Self-pay | Admitting: Family Medicine

## 2023-07-20 ENCOUNTER — Telehealth: Payer: Self-pay

## 2023-07-20 VITALS — BP 136/88 | HR 88 | Ht 75.0 in | Wt 268.0 lb

## 2023-07-20 DIAGNOSIS — M2550 Pain in unspecified joint: Secondary | ICD-10-CM | POA: Diagnosis not present

## 2023-07-20 DIAGNOSIS — M9902 Segmental and somatic dysfunction of thoracic region: Secondary | ICD-10-CM | POA: Diagnosis not present

## 2023-07-20 DIAGNOSIS — M9901 Segmental and somatic dysfunction of cervical region: Secondary | ICD-10-CM

## 2023-07-20 DIAGNOSIS — M9908 Segmental and somatic dysfunction of rib cage: Secondary | ICD-10-CM | POA: Diagnosis not present

## 2023-07-20 DIAGNOSIS — M9904 Segmental and somatic dysfunction of sacral region: Secondary | ICD-10-CM

## 2023-07-20 DIAGNOSIS — M51362 Other intervertebral disc degeneration, lumbar region with discogenic back pain and lower extremity pain: Secondary | ICD-10-CM | POA: Diagnosis not present

## 2023-07-20 DIAGNOSIS — M9903 Segmental and somatic dysfunction of lumbar region: Secondary | ICD-10-CM | POA: Diagnosis not present

## 2023-07-20 LAB — COMPREHENSIVE METABOLIC PANEL WITH GFR
ALT: 32 U/L (ref 0–53)
AST: 27 U/L (ref 0–37)
Albumin: 4.6 g/dL (ref 3.5–5.2)
Alkaline Phosphatase: 52 U/L (ref 39–117)
BUN: 30 mg/dL — ABNORMAL HIGH (ref 6–23)
CO2: 22 meq/L (ref 19–32)
Calcium: 10.9 mg/dL — ABNORMAL HIGH (ref 8.4–10.5)
Chloride: 108 meq/L (ref 96–112)
Creatinine, Ser: 1.44 mg/dL (ref 0.40–1.50)
GFR: 47.06 mL/min — ABNORMAL LOW (ref >60.00)
Glucose, Bld: 93 mg/dL (ref 70–99)
Potassium: 4.3 meq/L (ref 3.5–5.1)
Sodium: 138 meq/L (ref 135–145)
Total Bilirubin: 1 mg/dL (ref 0.2–1.2)
Total Protein: 7.2 g/dL (ref 6.0–8.3)

## 2023-07-20 LAB — URIC ACID: Uric Acid, Serum: 7.5 mg/dL (ref 4.0–7.8)

## 2023-07-20 LAB — VITAMIN D 25 HYDROXY (VIT D DEFICIENCY, FRACTURES): VITD: 32.01 ng/mL (ref 30.00–100.00)

## 2023-07-20 LAB — LACTATE DEHYDROGENASE: LDH: 152 U/L (ref 120–250)

## 2023-07-20 NOTE — Assessment & Plan Note (Signed)
 Continues to have low back pain.  Patient seems to be doing relatively well showing still concern with patient's hypercalcemia so would like to check again to see if anything is elevated.  Increase activity slowly otherwise.  Discussed icing regimen.  Does have known arthritic changes of the knees that we will need to continue to monitor.  Continue to work on core strength and try to get weight down which likely he has lost 6 pounds since we have seen him last.  Would like BMI under 30 follow-up again in 6 to 8 weeks

## 2023-07-20 NOTE — Telephone Encounter (Signed)
 Patient ran for Monovisc for left knee on 07/20/23. Case  #: [phone]. Pending approval.

## 2023-07-20 NOTE — Assessment & Plan Note (Signed)
 Rechecking labs including PTH

## 2023-07-20 NOTE — Patient Instructions (Addendum)
 Good to see you Labs today See me in 6-8 weeks

## 2023-07-22 LAB — PTH, INTACT AND CALCIUM
Calcium: 10.5 mg/dL — ABNORMAL HIGH (ref 8.6–10.3)
PTH: 79 pg/mL — ABNORMAL HIGH (ref 16–77)

## 2023-07-28 NOTE — Telephone Encounter (Signed)
 Scheduled for 09/20/23  MONOVISC authorized left knee Deductible does not apply OOP MAX $4000 has met $528.18 Copay $40 Once the OOP has been met patient covered at 100% PA NOT REQUIRED Reference # RACHEL G./ [id number]

## 2023-07-29 NOTE — Telephone Encounter (Signed)
 Noted

## 2023-08-01 ENCOUNTER — Ambulatory Visit (INDEPENDENT_AMBULATORY_CARE_PROVIDER_SITE_OTHER): Admitting: Psychology

## 2023-08-01 DIAGNOSIS — F411 Generalized anxiety disorder: Secondary | ICD-10-CM | POA: Diagnosis not present

## 2023-08-01 NOTE — Progress Notes (Signed)
 [facility] Counselor Initial Adult Exam  Name: Clayton [name] Date: 08/01/2023 MRN: [id number] DOB: 1945/04/03 PCP: Catherine Charlies LABOR, DO  Time Spent: 9:57 am - 10:44 am   :47  Minutes  Guardian/Payee:  Self.     Paperwork requested: No.   Reason for Visit /Presenting Problem: Anxiety  Mental Status Exam: Appearance:   Neat and Well Groomed     Behavior:  Appropriate  Motor:  Normal  Speech/Language:   Clear and Coherent  Affect:  Appropriate  Mood:  normal  Thought process:  normal  Thought content:    WNL  Sensory/Perceptual disturbances:    WNL  Orientation:  oriented to person, place, time/date, and situation  Attention:  Good  Concentration:  Good  Memory:  WNL  Fund of knowledge:   Good  Insight:    Good  Judgment:   Good  Impulse Control:  Good   Reported Symptoms:  Anxiety.   Risk Assessment: Danger to Self:  No Self-injurious Behavior: No Danger to Others: No Duty to Warn:no Physical Aggression / Violence:No  Access to Firearms a concern: No  Gang Involvement:No  Patient / guardian was educated about steps to take if suicide or homicide risk level increases between visits: no While future psychiatric events cannot be accurately predicted, the patient does not currently require acute inpatient psychiatric care and does not currently meet [facility]  involuntary commitment criteria.  Substance Abuse History: Current substance abuse: No     Caffeine:   None.  Tobacco:   Denied.  Alcohol:     Denied.  Substance use: denied.   Past Psychiatric History:   Previous psychological history is significant for anxiety Outpatient Providers:  Grief Group therapy after passing of Colleen. Attended sessions with Adina Sol, PsyD with [facility].  History of Psych Hospitalization: No  Psychological Testing: NA   Abuse History:  Victim of: No., na   Report needed: No. Victim of Neglect:No. Perpetrator of na  Witness /  Exposure to Domestic Violence: No   Protective Services Involvement: No  Witness to [employer] Violence:  No   Family History:  Family History  Problem Relation Age of Onset   Hypertension Mother    Stroke Mother    Diabetes Mother    Hyperlipidemia Mother    Obesity Mother    Arthritis Father    Heart disease Father    Other Father        gilberts   Diabetes Sister    Stroke Maternal Grandmother        54 died    Heart disease Maternal Grandfather    Hypertension Maternal Grandfather    Diabetes Maternal Grandfather    Heart disease Paternal Grandfather     Living situation: the patient lives with their family  Sexual Orientation: Straight  Relationship Status: married  Name of spouse / other: Slater (19 years - second marriage), Elida (deceased) If a parent, number of children / ages: Two son and two grandkids.   Support Systems: spouse  Financial Stress:  No , noted being financially comfortable.   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   Religion/Sprituality/World View: Catholic but never bought into it .  Any cultural differences that may affect / interfere with treatment:  not applicable.   Recreation/Hobbies: Golf, bowling.   Stressors: Other: Fear of leaving Slater (his wife), anxiety when waking, health concerns    Strengths: Supportive Relationships, Friends, Hopefulness,  Self Advocate, and Able to Communicate Effectively  Barriers:  NA    Legal History: Pending legal issue / charges: The patient has no significant history of legal issues. History of legal issue / charges: NA  Medical History/Surgical History: reviewed Past Medical History:  Diagnosis Date   Aortic atherosclerosis (HCC) 12/21/2021   Arthritis    hips    Atrial fibrillation (HCC)    Cataract    Chronic diastolic heart failure (HCC) 12/21/2021   Cochlear Meniere  syndrome of left ear 06/09/2015   CPAP (continuous positive airway pressure) dependence    Dysrhythmia    PAF- hx of 7 years ago    Gilbert's disease    HLD (hyperlipidemia)    Hypertension 05/29/2010   ECHO-EF 67% wnl   Meniere disease    Obesity    OSA on CPAP 08/20/2012   Palpitations    S/P right total hip arthroplasty 08/12/2020   Sleep apnea 09/14/2004   [facility]- Dr. fernand; CPAP titration 11/12/04- Dr. fernand   Squamous cell carcinoma of skin of neck    right; 2023    Past Surgical History:  Procedure Laterality Date   ATRIAL FIBRILLATION ABLATION N/A 09/10/2020   Procedure: ATRIAL FIBRILLATION ABLATION;  Surgeon: Inocencio Soyla Lunger, MD;  Location: [facility] INVASIVE CV LAB;  Service: Cardiovascular;  Laterality: N/A;   CARDIAC CATHETERIZATION  11/03/2005   CARDIOVERSION N/A 09/26/2018   Procedure: CARDIOVERSION;  Surgeon: Barbaraann Darryle Ned, MD;  Location: [facility] ENDOSCOPY;  Service: Endoscopy;  Laterality: N/A;   CARDIOVERSION N/A 03/13/2019   Procedure: CARDIOVERSION;  Surgeon: Raford Riggs, MD;  Location: [facility] ENDOSCOPY;  Service: Cardiovascular;  Laterality: N/A;   CARDIOVERSION N/A 03/03/2021   Procedure: CARDIOVERSION;  Surgeon: Delford Maude BROCKS, MD;  Location: [facility] ENDOSCOPY;  Service: Cardiovascular;  Laterality: N/A;   CATARACT EXTRACTION  01/19/2012   KNEE ARTHROSCOPY     left knee- 2002    LAPAROSCOPIC PARTIAL RIGHT COLECTOMY Right 12/21/2021   Procedure: LAPAROSCOPIC RIGHT HEMICOLECTOMY;  Surgeon: Teresa Lonni HERO, MD;  Location: [facility] ORS;  Service: General;  Laterality: Right;   TOTAL HIP ARTHROPLASTY  06/08/2011   Procedure: TOTAL HIP ARTHROPLASTY ANTERIOR APPROACH;  Surgeon: Donnice JONETTA Car, MD;  Location: [facility] ORS;  Service: Orthopedics;  Laterality: Left;   TOTAL HIP ARTHROPLASTY Right 08/12/2020   Procedure: TOTAL HIP ARTHROPLASTY ANTERIOR APPROACH;  Surgeon: Car Donnice, MD;  Location: [facility] ORS;  Service: Orthopedics;  Laterality: Right;     Medications: Current Outpatient Medications  Medication Sig Dispense Refill   amiodarone  (PACERONE ) 200 MG tablet Take 200 mg by mouth.     amLODipine  (NORVASC ) 10 MG tablet TAKE 1 TABLET BY MOUTH EVERY DAY 90 tablet 3   apixaban  (ELIQUIS ) 5 MG TABS tablet Take 1 tablet (5 mg total) by mouth 2 (two) times daily. 180 tablet 1   Coenzyme Q10 200 MG capsule Take 200 mg by mouth at bedtime.     cyanocobalamin (VITAMIN B12) 100 MCG tablet Take 500 mcg by mouth daily. Patient takes a gummie     ezetimibe  (ZETIA ) 10 MG tablet TAKE 1 TABLET (10 MG TOTAL) BY MOUTH IN THE MORNING 90 tablet 3   ferrous sulfate  325 (65 FE) MG EC tablet Take 325 mg by mouth in the morning and at bedtime.     metFORMIN  (GLUCOPHAGE ) 500 MG tablet TAKE 1 TABLET BY MOUTH EVERY DAY WITH BREAKFAST 90 tablet 1   metoprolol  succinate (TOPROL -XL) 50 MG 24 hr tablet TAKE 100 MG (2  TABLETS) BY MOUTH IN THE MORNING, AND 75 MG (1.5 TABLETS) IN THE EVENING. 360 tablet 3   Multiple Vitamins-Minerals (MULTI-VITAMIN/MINERALS PO) Take by mouth daily. Takes two in the AM     olmesartan  (BENICAR ) 20 MG tablet Take 1 tablet (20 mg total) by mouth daily. 90 tablet 3   Pitavastatin  Calcium  2 MG TABS TAKE 1 TABLET BY MOUTH EVERY DAY IN THE EVENING 90 tablet 3   spironolactone  (ALDACTONE ) 25 MG tablet Take 1 tablet (25 mg total) by mouth daily. 90 tablet 3   tamsulosin  (FLOMAX ) 0.4 MG CAPS capsule Take 0.4 mg by mouth in the morning.     triamcinolone  cream (KENALOG ) 0.5 % APPLY 1 APPLICATION TOPICALLY 2 (TWO) TIMES DAILY. TO AFFECTED AREAS. 30 g 3   Zinc Sulfate (ZINC 15 PO) Take 30 mcg by mouth. Patient takes a gummy     No current facility-administered medications for this visit.    No Known Allergies  Diagnoses:  Generalized anxiety disorder  Psychiatric Treatment: No , none.   Plan of Care: Outpatient Therapy.   Narrative:  Clayton [name] participated from office with therapist and consented to treatment. We reviewed the  limits of confidentiality prior to the start of the evaluation. Clayton [name] expressed understanding and agreement to proceed. This is his annual evaluation. He has been attending treatment with the therapist for the past year. He has been attending counseling consistently and engaging during his sessions, working between sessions on goals, and noted improvement over the past year. He noted his stressors being worry of leaving his wife, Slater, if he were to pass. Additional stressors include morning time anxiety which affects his BP and his chronic health issues. He noted a history of cardiological issues, cancer, and joint replacement. He appears to be address his health consistently. He meets with various specialists on a frequent basis. He noted much of his stressors related to his aging process. He noted a recent doctor appointment with elevated labs that resulted in a week of stress before this stress reduced. He noted his efforts to manage this via tools learned in therapy and noted this being helpful. He noted his effort and intent to work on his diet and lose weight, which he believes will help improve his health and possibly reduce his anxiety. He noted grounding, mindfulness, and relaxation exercises being helpful for this. He noted his effort to managing his anxiety proactively and noted this being most noticeable in the morning time. He noted often waking with anxiety and noted his consistent effort to manage this. He noted this being exacerbated by health concerns and worry about his partner. He noted his effort to build a routine for relaxation, specifically in the morning, and noted we are going there, but not where we could be. Additionally, he noted experiencing white coat syndrome but noted some improvement in this area. He noted his interest in continued therapy to work on managing his symptoms, focusing on employing his tools, learning more tools that can aid in managing his symptoms, and  being more mindful. He continues to experience anxiety on a consistent basis, particularly in the morning, He would benefit from more consistent self-care. He presented as intelligent, self-aware, and motivated for change. He noted a need to stabilize more quickly after stressors, particularly health related stressors.   GAD-7: 3 PHQ-9: 0  [name], LCSW

## 2023-08-03 ENCOUNTER — Ambulatory Visit: Admitting: Cardiology

## 2023-08-11 DIAGNOSIS — G4733 Obstructive sleep apnea (adult) (pediatric): Secondary | ICD-10-CM | POA: Diagnosis not present

## 2023-08-22 DIAGNOSIS — L308 Other specified dermatitis: Secondary | ICD-10-CM | POA: Diagnosis not present

## 2023-08-22 DIAGNOSIS — D225 Melanocytic nevi of trunk: Secondary | ICD-10-CM | POA: Diagnosis not present

## 2023-08-22 DIAGNOSIS — L812 Freckles: Secondary | ICD-10-CM | POA: Diagnosis not present

## 2023-08-22 DIAGNOSIS — L821 Other seborrheic keratosis: Secondary | ICD-10-CM | POA: Diagnosis not present

## 2023-08-22 DIAGNOSIS — Z85828 Personal history of other malignant neoplasm of skin: Secondary | ICD-10-CM | POA: Diagnosis not present

## 2023-08-22 DIAGNOSIS — I872 Venous insufficiency (chronic) (peripheral): Secondary | ICD-10-CM | POA: Diagnosis not present

## 2023-09-05 ENCOUNTER — Ambulatory Visit (INDEPENDENT_AMBULATORY_CARE_PROVIDER_SITE_OTHER): Admitting: Psychology

## 2023-09-05 DIAGNOSIS — F411 Generalized anxiety disorder: Secondary | ICD-10-CM

## 2023-09-05 NOTE — Progress Notes (Signed)
 [facility] Counselor/Therapist Progress Note  Patient ID: NASHTON BELSON, MRN: [id number]    Date: 09/05/23  Time Spent: 10:02  am - 10:48 am : 46 Minutes  Treatment Type: Individual Therapy.  Reported Symptoms: Anxiety  Mental Status Exam: Appearance:  Casual     Behavior: Appropriate  Motor: Normal  Speech/Language:  Clear and Coherent  Affect: Congruent  Mood: normal  Thought process: normal  Thought content:   WNL  Sensory/Perceptual disturbances:   WNL  Orientation: oriented to person, place, time/date, and situation  Attention: Good  Concentration: Good  Memory: WNL  Fund of knowledge:  Good  Insight:   Good  Judgment:  Good  Impulse Control: Good   Risk Assessment: Danger to Self:  No Self-injurious Behavior: No Danger to Others: No Duty to Warn:no Physical Aggression / Violence:No  Access to Firearms a concern: No  Gang Involvement:No   Subjective:   Maple LITTIE Constant participated in the session, in person in the office with the therapist, and consented to treatment Duc reviewed the events of the past week. We reviewed numerous treatment approaches including CBT, BA, Problem Solving, and Solution focused therapy. Psych-education regarding the Norvell's diagnosis of Generalized anxiety disorder was provided during the session. We discussed Maple LITTIE Steinhoff's goals treatment goals which include  improve his health, continued building a mindfulness practice, managing his mood proactively and consistently, process past events, and bolster coping skills. He noted an interest a need to process his propensity to get things done.  Additionally, he noted a need to explore having to pay people for services that he previously would do himself. Additionally, he noted a need to be be mindful of his diligence in various areas and noted a need to be more mindful of this and choosing when to pursue this or delay it for a later date. Elfego L Burtis provided  verbal approval of the treatment plan.   Interventions: Psycho-education & Goal Setting.   Diagnosis:   Generalized anxiety disorder  Psychiatric Treatment: No , NA  Treatment Plan:  Client Abilities/Strengths Laney is intelligent, self-aware, and motivated for change.   Support System: Family and friends.   Client Treatment Preferences OPT  Client Statement of Needs Kong would like to improve his health, continued building a mindfulness practice, managing his mood proactively and consistently, process past events, and bolster coping skills. He noted an interest a need to process his propensity to get things done.  Additionally, he noted a need to explore having to pay people for services that he previously would do himself. Additionally, he noted a need to be be mindful of his diligence in various areas and noted a need to be more mindful of this and choosing when to pursue this or delay it for a later date.   Treatment Level Weekly  Symptoms  Anxiety: Feeling anxious, difficulty managing worry, and worrying about different things.   (Status: maintained)  Goals:   Maple experiences symptoms of anxiety.   Treatment plan signed and available on s-drive:  No, pending signature via MyChart.  KALEL HARTY was sent the treatment plan signature form on 09/05/23.   Target Date: 09/04/24 Frequency: Weekly  Progress: 0 Modality: individual    Therapist will provide referrals for additional resources as appropriate.  Therapist will provide psycho-education regarding Sayvon's diagnosis and corresponding treatment approaches and interventions. [name], LCSW will support the patient's ability to achieve the goals identified. will employ CBT, BA, Problem-solving, Solution Focused, Mindfulness,  coping  skills, & other evidenced-based practices will be used to promote progress towards healthy functioning to help manage decrease symptoms associated with his diagnosis.    Reduce overall level, frequency, and intensity of the feelings of anxiety evidenced by decreased overall symptoms from 6 to 7 days/week to 0 to 1 days/week per client report for at least 3 consecutive months. Verbally express understanding of the relationship between feelings of anxiety and their impact on thinking patterns and behaviors. Verbalize an understanding of the role that distorted thinking plays in creating fears, excessive worry, and ruminations.  [name] participated in the creation of the treatment plan)    [name], LCSW

## 2023-09-06 ENCOUNTER — Encounter: Payer: Self-pay | Admitting: Psychology

## 2023-09-08 ENCOUNTER — Ambulatory Visit: Admitting: Cardiology

## 2023-09-11 DIAGNOSIS — G4733 Obstructive sleep apnea (adult) (pediatric): Secondary | ICD-10-CM | POA: Diagnosis not present

## 2023-09-14 NOTE — Progress Notes (Signed)
 [name] [address] [place] [address] Phone: [phone] Subjective:   LILLETTE Claretha Schimke am a scribe for Dr. Claudene.   I'm seeing this patient by the request  of:  Kuneff, Renee A, DO  CC: Back pain and knee pain  YEP:Dlagzrupcz  Clayton [name] is a 78 y.o. male coming in with complaint of back and neck pain. OMT on 07/20/2023.  Regarding his knee last steroid injection was in May 2025, had been doing well but did have approval for viscosupplementation if needed.  Patient states he feels like it is time for the injection.           Reviewed prior external information including notes and imaging from previsou exam, outside providers and external EMR if available.   As well as notes that were available from care everywhere and other healthcare systems.  Past medical history, social, surgical and family history all reviewed in electronic medical record.  No pertanent information unless stated regarding to the chief complaint.   Past Medical History:  Diagnosis Date   Aortic atherosclerosis (HCC) 12/21/2021   Arthritis    hips    Atrial fibrillation (HCC)    Cataract    Chronic diastolic heart failure (HCC) 12/21/2021   Cochlear Meniere syndrome of left ear 06/09/2015   CPAP (continuous positive airway pressure) dependence    Dysrhythmia    PAF- hx of 7 years ago    Gilbert's disease    HLD (hyperlipidemia)    Hypertension 05/29/2010   ECHO-EF 67% wnl   Meniere disease    Obesity    OSA on CPAP 08/20/2012   Palpitations    S/P right total hip arthroplasty 08/12/2020   Sleep apnea 09/14/2004    [facility]- Dr. fernand; CPAP titration 11/12/04- Dr. fernand   Squamous cell carcinoma of skin of neck    right; 2023    No Known Allergies   Review of Systems:  No headache, visual changes, nausea, vomiting, diarrhea, constipation, dizziness, abdominal pain, skin rash, fevers, chills, night sweats, weight loss, swollen  lymph nodes, body aches, joint swelling, chest pain, shortness of breath, mood changes. POSITIVE muscle aches  Objective  Blood pressure (!) 150/90, pulse 84, height 6' 3 (1.905 m), SpO2 96%.   General: No apparent distress alert and oriented x3 mood and affect normal, dressed appropriately.  HEENT: Pupils equal, extraocular movements intact  Respiratory: Patient's speak in full sentences and does not appear short of breath  Cardiovascular: No lower extremity edema, non tender, no erythema  Gait relatively normal MSK:  Back does have some loss of lordosis noted as well.  Some tenderness to palpation in the paraspinal musculature. Knee exam shows does have arthritic changes noted.  Varus deformity of the left knee noted.  Fairly severe overall.  Does have effusion noted.  Osteopathic findings  C2 flexed rotated and side bent right C7 flexed rotated and side bent left T3 extended rotated and side bent right inhaled rib T5 extended rotated and side bent left L1 flexed rotated and side bent right Sacrum right on right   After informed written and verbal consent, patient was seated on exam table. Left knee was prepped with alcohol swab and utilizing anterolateral approach, patient's left knee space was injected with 40 mg per 3 mL of Monovisc (sodium hyaluronate) in a prefilled syringe was injected easily into the knee through a 22-gauge needle..Patient tolerated the procedure well without immediate complications.    Assessment  and Plan:  Degenerative arthritis of left knee Patient given viscosupplementation.  Patient has responded to this in the past and very hopeful that I will continue to do so.  Discussed icing regimen and home exercises, discussed which activities to do and which ones to avoid.  Increase activity slowly.  Continue to be active.  Will follow-up again in 6 to 8 weeks  Degenerative disc disease, lumbar Patient does have some loss of lordosis continue to work on core  strengthening.  He does need to continue to work on some weight loss.  Hopefully the injection of the knee will be significantly more beneficial.  Increase activity slowly.  Follow-up again in 6 to 8 weeks    Nonallopathic problems  Decision today to treat with OMT was based on Physical Exam  After verbal consent patient was treated with HVLA, ME, FPR techniques in cervical, rib, thoracic, lumbar, and sacral  areas  Patient tolerated the procedure well with improvement in symptoms  Patient given exercises, stretches and lifestyle modifications  See medications in patient instructions if given  Patient will follow up in 4-8 weeks    The above documentation has been reviewed and is accurate and complete Clayton Canaday M Deshawn Witty, DO          Note: This dictation was prepared with Dragon dictation along with smaller phrase technology. Any transcriptional errors that result from this process are unintentional.

## 2023-09-20 ENCOUNTER — Ambulatory Visit: Admitting: Family Medicine

## 2023-09-20 ENCOUNTER — Encounter: Payer: Self-pay | Admitting: Family Medicine

## 2023-09-20 VITALS — BP 150/90 | HR 84 | Ht 75.0 in

## 2023-09-20 DIAGNOSIS — M9904 Segmental and somatic dysfunction of sacral region: Secondary | ICD-10-CM | POA: Diagnosis not present

## 2023-09-20 DIAGNOSIS — M9901 Segmental and somatic dysfunction of cervical region: Secondary | ICD-10-CM

## 2023-09-20 DIAGNOSIS — M1712 Unilateral primary osteoarthritis, left knee: Secondary | ICD-10-CM

## 2023-09-20 DIAGNOSIS — M51362 Other intervertebral disc degeneration, lumbar region with discogenic back pain and lower extremity pain: Secondary | ICD-10-CM | POA: Diagnosis not present

## 2023-09-20 DIAGNOSIS — M9903 Segmental and somatic dysfunction of lumbar region: Secondary | ICD-10-CM

## 2023-09-20 DIAGNOSIS — M9902 Segmental and somatic dysfunction of thoracic region: Secondary | ICD-10-CM | POA: Diagnosis not present

## 2023-09-20 MED ORDER — HYALURONAN 88 MG/4ML IX SOSY
88.0000 mg | PREFILLED_SYRINGE | Freq: Once | INTRA_ARTICULAR | Status: AC
Start: 2023-09-20 — End: 2023-09-20
  Administered 2023-09-20: 88 mg via INTRA_ARTICULAR

## 2023-09-20 NOTE — Assessment & Plan Note (Addendum)
 Patient does have some loss of lordosis continue to work on core strengthening.  He does need to continue to work on some weight loss.  Hopefully the injection of the knee will be significantly more beneficial.  Increase activity slowly.  Follow-up again in 6 to 8 weeks worsening pain would consider advanced imaging or epidural again but do not think this will hopefully be contributing.

## 2023-09-20 NOTE — Assessment & Plan Note (Signed)
 Patient given viscosupplementation.  Patient has responded to this in the past and very hopeful that I will continue to do so.  Discussed icing regimen and home exercises, discussed which activities to do and which ones to avoid.  Increase activity slowly.  Continue to be active.  Will follow-up again in 6 to 8 weeks

## 2023-09-20 NOTE — Patient Instructions (Addendum)
 Good to see you. Monovisc injection today. See me again in 7 to 8 weeks.

## 2023-09-21 ENCOUNTER — Other Ambulatory Visit: Payer: Self-pay

## 2023-09-22 MED ORDER — PITAVASTATIN CALCIUM 2 MG PO TABS: 2.0000 mg | ORAL_TABLET | Freq: Every day | ORAL | 1 refills | Status: AC

## 2023-10-03 ENCOUNTER — Ambulatory Visit (INDEPENDENT_AMBULATORY_CARE_PROVIDER_SITE_OTHER): Admitting: Psychology

## 2023-10-03 DIAGNOSIS — F411 Generalized anxiety disorder: Secondary | ICD-10-CM | POA: Diagnosis not present

## 2023-10-03 NOTE — Progress Notes (Signed)
 [facility] Counselor/Therapist Progress Note  Patient ID: Clayton [name], MRN: [id number]    Date: 10/03/23  Time Spent: 10:02  am - 10:44 am : 42 Minutes  Treatment Type: Individual Therapy.  Reported Symptoms: Anxiety  Mental Status Exam: Appearance:  Casual     Behavior: Appropriate  Motor: Normal  Speech/Language:  Clear and Coherent  Affect: Congruent  Mood: anxious  Thought process: normal  Thought content:   WNL  Sensory/Perceptual disturbances:   WNL  Orientation: oriented to person, place, time/date, and situation  Attention: Good  Concentration: Good  Memory: WNL  Fund of knowledge:  Good  Insight:   Good  Judgment:  Good  Impulse Control: Good   Risk Assessment: Danger to Self:  No Self-injurious Behavior: No Danger to Others: No Duty to Warn:no Physical Aggression / Violence:No  Access to Firearms a concern: No  Gang Involvement:No   Subjective:   Clayton [name] Constant participated in the session, in person in the office with the therapist, and consented to treatment Jp reviewed the events of the past week. Clayton [name] noted continued anxiety while he is sleeping and noted that upon waking, he is better able to manage it. He noted his efforts to identify the contributing factors to his anxiety. He noted his dreams often being related about getting tasks done despite this not being actually the case. 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 He noted the how fulfilling and meaningful for him. He noted that he would not wish to take on this role again, due to the stress, but noted missing some aspects of this. We explored this during the session. Clayton [name] identified an insight that his dreams are often about being  productive and more so, being effective.  We worked on identifying possible tasks and behaviors that he could engage in that would help him feel  productive and effective. Therapist encouraged Clayton [name] to work on identifying said tasks, between sessions, to be processed going forward. Clayton [name] was receptive to this during the session. He expressed commitment towards treatment goals. Therapist validated Clayton [name]'s feelings and experience, during the session, and provided supportive therapy. A follow-up was scheduled for continued treatment, which he benefits from.   Interventions: CBT  Diagnosis:   Generalized anxiety disorder  Psychiatric Treatment: No , NA  Treatment Plan:  Client Abilities/Strengths Clayton [name] is intelligent, self-aware, and motivated for change.   Support System: Family and friends.   Client Treatment Preferences OPT  Client Statement of Needs Clayton [name] would like to improve his health, continued building a mindfulness practice, managing his mood proactively and consistently, process past events, and bolster coping skills. He noted an interest a need to process his propensity to get things done.  Additionally, he noted a need to explore having to pay people for services that he previously would do himself. Additionally, he noted a need to be be mindful of his diligence in various areas and noted a need to be more mindful of this and choosing when to pursue this or delay it for a later date.   Treatment Level Weekly  Symptoms  Anxiety: Feeling anxious, difficulty managing worry, and worrying about different things.   (Status: maintained)  Goals:   Clayton experiences symptoms of anxiety.   Treatment plan signed and available on s-drive:  No, pending signature via MyChart.  Clayton [name] was sent the treatment plan signature form on 09/05/23.  Target Date: 09/04/24 Frequency: Weekly  Progress: 0 Modality: individual    Therapist will provide referrals for additional resources as appropriate.  Therapist will provide psycho-education regarding Clayton [name]'s diagnosis and corresponding treatment approaches and  interventions. [name], LCSW will support the patient's ability to achieve the goals identified. will employ CBT, BA, Problem-solving, Solution Focused, Mindfulness,  coping skills, & other evidenced-based practices will be used to promote progress towards healthy functioning to help manage decrease symptoms associated with his diagnosis.   Reduce overall level, frequency, and intensity of the feelings of anxiety evidenced by decreased overall symptoms from 6 to 7 days/week to 0 to 1 days/week per client report for at least 3 consecutive months. Verbally express understanding of the relationship between feelings of anxiety and their impact on thinking patterns and behaviors. Verbalize an understanding of the role that distorted thinking plays in creating fears, excessive worry, and ruminations.  Clayton [name] participated in the creation of the treatment plan)    [name], LCSW

## 2023-10-06 ENCOUNTER — Encounter: Payer: Self-pay | Admitting: Family Medicine

## 2023-10-12 DIAGNOSIS — G4733 Obstructive sleep apnea (adult) (pediatric): Secondary | ICD-10-CM | POA: Diagnosis not present

## 2023-10-12 NOTE — Progress Notes (Signed)
 Clayton [name]                                          MRN: [id number]   10/12/2023   The [facility] Quality Team Specialist reviewed this patient medical record for the purposes of chart review for care gap closure. The following were reviewed: chart review for care gap closure-kidney health evaluation for diabetes:eGFR  and uACR.    [facility]BCI Quality Team

## 2023-10-25 ENCOUNTER — Ambulatory Visit

## 2023-10-25 ENCOUNTER — Ambulatory Visit: Payer: Self-pay | Admitting: Family Medicine

## 2023-10-25 ENCOUNTER — Ambulatory Visit: Admitting: Family Medicine

## 2023-10-25 VITALS — BP 130/80 | HR 79 | Ht 75.0 in | Wt 272.0 lb

## 2023-10-25 DIAGNOSIS — R5383 Other fatigue: Secondary | ICD-10-CM

## 2023-10-25 DIAGNOSIS — R0602 Shortness of breath: Secondary | ICD-10-CM

## 2023-10-25 DIAGNOSIS — R630 Anorexia: Secondary | ICD-10-CM | POA: Diagnosis not present

## 2023-10-25 DIAGNOSIS — I5032 Chronic diastolic (congestive) heart failure: Secondary | ICD-10-CM

## 2023-10-25 DIAGNOSIS — E213 Hyperparathyroidism, unspecified: Secondary | ICD-10-CM

## 2023-10-25 LAB — COMPREHENSIVE METABOLIC PANEL WITH GFR
ALT: 45 U/L (ref 0–53)
AST: 31 U/L (ref 0–37)
Albumin: 4.5 g/dL (ref 3.5–5.2)
Alkaline Phosphatase: 54 U/L (ref 39–117)
BUN: 22 mg/dL (ref 6–23)
CO2: 26 meq/L (ref 19–32)
Calcium: 10.5 mg/dL (ref 8.4–10.5)
Chloride: 106 meq/L (ref 96–112)
Creatinine, Ser: 1.4 mg/dL (ref 0.40–1.50)
GFR: 48.58 mL/min — ABNORMAL LOW (ref >60.00)
Glucose, Bld: 112 mg/dL — ABNORMAL HIGH (ref 70–99)
Potassium: 4.7 meq/L (ref 3.5–5.1)
Sodium: 139 meq/L (ref 135–145)
Total Bilirubin: 1.1 mg/dL (ref 0.2–1.2)
Total Protein: 6.7 g/dL (ref 6.0–8.3)

## 2023-10-25 LAB — VITAMIN D 25 HYDROXY (VIT D DEFICIENCY, FRACTURES): VITD: 31.01 ng/mL (ref 30.00–100.00)

## 2023-10-25 LAB — URINALYSIS
Bilirubin Urine: NEGATIVE
Hgb urine dipstick: NEGATIVE
Ketones, ur: NEGATIVE
Leukocytes,Ua: NEGATIVE
Nitrite: NEGATIVE
Specific Gravity, Urine: 1.025 (ref 1.000–1.030)
Total Protein, Urine: NEGATIVE
Urine Glucose: NEGATIVE
Urobilinogen, UA: 0.2 (ref 0.0–1.0)
pH: 6 (ref 5.0–8.0)

## 2023-10-25 LAB — PSA: PSA: 3.7 ng/mL (ref 0.10–4.00)

## 2023-10-25 LAB — SEDIMENTATION RATE: Sed Rate: 14 mm/h (ref 0–20)

## 2023-10-25 LAB — LACTATE DEHYDROGENASE: LDH: 153 U/L (ref 120–250)

## 2023-10-25 NOTE — Progress Notes (Signed)
 Clayton [name] [address] [place] [address] Phone: [phone] Subjective:   Clayton [name], am serving as a scribe for Dr. Arthea Claudene.  I'm seeing this patient by the request  of:  Kuneff, Renee A, DO  CC: Hyperparathyroidism  YEP:Dlagzrupcz  Clayton [name] is a 78 y.o. male coming in with complaint of hyperparathyroidism.  Patient did have significant elevation in PTH previously but was downtrending from 127-79 with calcium  levels going from 10.9-10.5.  Patient was feeling better initially.  Labs were 3 months ago.  GFR was also lower but at patient's baseline of 47.   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   CPAP (continuous positive airway pressure) dependence    Dysrhythmia    PAF- hx of 7 years ago    Gilbert's disease    HLD (hyperlipidemia)    Hypertension 05/29/2010   ECHO-EF 67% wnl   Meniere disease    Obesity    OSA on CPAP 08/20/2012   Palpitations    S/P right total hip arthroplasty 08/12/2020   Sleep apnea 09/14/2004   [facility]- Dr. fernand; CPAP titration 11/12/04- Dr. fernand   Squamous cell carcinoma of skin of neck    right; 2023   Past Surgical History:  Procedure Laterality Date   ATRIAL FIBRILLATION ABLATION N/A 09/10/2020   Procedure: ATRIAL FIBRILLATION ABLATION;  Surgeon: Inocencio Soyla Lunger, MD;  Location: [facility];  Service: Cardiovascular;  Laterality: N/A;   CARDIAC CATHETERIZATION  11/03/2005   CARDIOVERSION N/A 09/26/2018   Procedure: CARDIOVERSION;  Surgeon: Barbaraann Darryle Ned, MD;  Location: [facility] ENDOSCOPY;  Service: Endoscopy;  Laterality: N/A;   CARDIOVERSION N/A 03/13/2019   Procedure: CARDIOVERSION;  Surgeon: Raford Riggs, MD;  Location: [facility] ENDOSCOPY;  Service:  Cardiovascular;  Laterality: N/A;   CARDIOVERSION N/A 03/03/2021   Procedure: CARDIOVERSION;  Surgeon: Delford Maude BROCKS, MD;  Location: [facility] ENDOSCOPY;  Service: Cardiovascular;  Laterality: N/A;   CATARACT EXTRACTION  01/19/2012   KNEE ARTHROSCOPY     left knee- 2002    LAPAROSCOPIC PARTIAL RIGHT COLECTOMY Right 12/21/2021   Procedure: LAPAROSCOPIC RIGHT HEMICOLECTOMY;  Surgeon: Teresa Lonni HERO, MD;  Location: [facility] ORS;  Service: General;  Laterality: Right;   TOTAL HIP ARTHROPLASTY  06/08/2011   Procedure: TOTAL HIP ARTHROPLASTY ANTERIOR APPROACH;  Surgeon: Donnice JONETTA Car, MD;  Location: [facility] ORS;  Service: Orthopedics;  Laterality: Left;   TOTAL HIP ARTHROPLASTY Right 08/12/2020   Procedure: TOTAL HIP ARTHROPLASTY ANTERIOR APPROACH;  Surgeon: Car Donnice, MD;  Location: [facility];  Service: Orthopedics;  Laterality: Right;   Social History   Socioeconomic History   Marital status: Married    Spouse name: Maikol Grassia   Number of children: 2   Years of education: Not on file   Highest education level: Not on file  Occupational History   Occupation: Retired  Tobacco Use   Smoking status: Never   Smokeless tobacco: Never  Vaping Use   Vaping status: Never Used  Substance and Sexual Activity   Alcohol use: No   Drug use: No   Sexual activity: Yes    Birth control/protection: None  Other Topics Concern   Not on file  Social History Narrative   Married. Slater  Leisa.Takes care of his mother-in-law as well.    Retired, Manufacturing engineer.    Drinks caffeine beverages. Takes a daily vitamin.   Exercises routinely.    Smoke detector in the home.    Ambulates independently.    2-sons   Social Drivers of Corporate investment banker Strain: Low Risk  (04/14/2022)   Overall Financial Resource Strain (CARDIA)    Difficulty of Paying Living Expenses: Not hard at all  Food Insecurity: Low Risk  (04/28/2023)   Received from [facility]   Hunger Vital Sign    Within the past 12 months,  you worried that your food would run out before you got money to buy more: Never true    Within the past 12 months, the food you bought just didn't last and you didn't have money to get more. : Never true  Transportation Needs: No Transportation Needs (04/28/2023)   Received from [facility]    In the past 12 months, has lack of reliable transportation kept you from medical appointments, meetings, work or from getting things needed for daily living? : No  Physical Activity: Sufficiently Active (04/14/2022)   Exercise Vital Sign    Days of Exercise per Week: 7 days    Minutes of Exercise per Session: 50 min  Stress: No Stress Concern Present (04/14/2022)   [employer] of Occupational Health - Occupational Stress Questionnaire    Feeling of Stress : Not at all  Social Connections: Unknown (11/16/2022)   Received from [employer]   Social Network    Social Network: Not on file   No Known Allergies Family History  Problem Relation Age of Onset   Hypertension Mother    Stroke Mother    Diabetes Mother    Hyperlipidemia Mother    Obesity Mother    Arthritis Father    Heart disease Father    Other Father        gilberts   Diabetes Sister    Stroke Maternal Grandmother        2052-11-22 died    Heart disease Maternal Grandfather    Hypertension Maternal Grandfather    Diabetes Maternal Grandfather    Heart disease Paternal Grandfather     Current Outpatient Medications (Endocrine & Metabolic):    metFORMIN  (GLUCOPHAGE ) 500 MG tablet, TAKE 1 TABLET BY MOUTH EVERY DAY WITH BREAKFAST  Current Outpatient Medications (Cardiovascular):    amiodarone  (PACERONE ) 200 MG tablet, Take 200 mg by mouth.   amLODipine  (NORVASC ) 10 MG tablet, TAKE 1 TABLET BY MOUTH EVERY DAY   ezetimibe  (ZETIA ) 10 MG tablet, TAKE 1 TABLET (10 MG TOTAL) BY MOUTH IN THE MORNING   metoprolol  succinate (TOPROL -XL) 50 MG 24 hr tablet, TAKE 100 MG (2 TABLETS) BY MOUTH IN THE MORNING, AND 75 MG (1.5  TABLETS) IN THE EVENING.   olmesartan  (BENICAR ) 20 MG tablet, Take 1 tablet (20 mg total) by mouth daily.   Pitavastatin  Calcium  2 MG TABS, Take 1 tablet (2 mg total) by mouth daily. In the evening   spironolactone  (ALDACTONE ) 25 MG tablet, Take 1 tablet (25 mg total) by mouth daily.    Current Outpatient Medications (Hematological):    apixaban  (ELIQUIS ) 5 MG TABS tablet, Take 1 tablet (5 mg total) by mouth 2 (two) times daily.   cyanocobalamin (VITAMIN B12) 100 MCG tablet, Take 500 mcg by mouth daily. Patient takes a gummie   ferrous sulfate  325 (65 FE) MG EC tablet, Take 325 mg by mouth  in the morning and at bedtime.  Current Outpatient Medications (Other):    Coenzyme Q10 200 MG capsule, Take 200 mg by mouth at bedtime.   Multiple Vitamins-Minerals (MULTI-VITAMIN/MINERALS PO), Take by mouth daily. Takes two in the AM   tamsulosin  (FLOMAX ) 0.4 MG CAPS capsule, Take 0.4 mg by mouth in the morning.   triamcinolone  cream (KENALOG ) 0.5 %, APPLY 1 APPLICATION TOPICALLY 2 (TWO) TIMES DAILY. TO AFFECTED AREAS.   Zinc Sulfate (ZINC 15 PO), Take 30 mcg by mouth. Patient takes a gummy   Reviewed prior external information including notes and imaging from  primary care provider As well as notes that were available from care everywhere and other healthcare systems.  Past medical history, social, surgical and family history all reviewed in electronic medical record.  No pertanent information unless stated regarding to the chief complaint.   Review of Systems:  No visual changes, nausea, vomiting, diarrhea, constipation, dizziness, abdominal pain, skin rash, fevers, chills, night sweats, weight loss, swollen lymph nodes,  joint swelling, chest pain, shortness of breath, mood changes. POSITIVE muscle aches, shortness of breath, body aches  Objective  Blood pressure 130/80, pulse 79, height 6' 3 (1.905 m), weight 272 lb (123.4 kg), SpO2 97%.   General: No apparent distress alert and oriented x3  mood and affect normal, dressed appropriately.  HEENT: Pupils equal, extraocular movements intact  Respiratory: Patient's speak in full sentences seemed tired with walking up down the hallway 1 or 2 times Cardiovascular: No lower extremity edema, non tender, no erythema  Patient does have fullness noted of the neck.  Seems to be more right greater than left but still no specific findings of a nodule. No abdominal pain noted.   Impression and Recommendations:    The above documentation has been reviewed and is accurate and complete Graeson Nouri M Myya Meenach, DO

## 2023-10-25 NOTE — Assessment & Plan Note (Signed)
 Continue to have hypercalcemia past medical history colon cancer status post removal as well as.    Thyroid  ultrasound did not show any true adenoma but SPECT test is necessary.  Patient is having more shortness of breath will track impaired renal function previously seen before.  Will also get a chest x-ray.  May need a CT of the chest, abdomen and pelvis if this continues.

## 2023-10-25 NOTE — Assessment & Plan Note (Signed)
 No swelling noted at this time.

## 2023-10-25 NOTE — Patient Instructions (Addendum)
 Labs today Wish you luck on TV front  Xray today See me again as scheduled

## 2023-10-27 ENCOUNTER — Other Ambulatory Visit: Payer: Self-pay

## 2023-10-27 LAB — PTH, INTACT AND CALCIUM
Calcium: 10.3 mg/dL (ref 8.6–10.3)
PTH: 129 pg/mL — ABNORMAL HIGH (ref 16–77)

## 2023-11-01 LAB — CEA: CEA: 2 ng/mL

## 2023-11-01 LAB — PTH-RELATED PEPTIDE: PTH-Related Protein (PTH-RP): 11 pg/mL (ref 11–20)

## 2023-11-08 NOTE — Progress Notes (Unsigned)
 [name] [address] [place] [address] Phone: [phone] Subjective:   Clayton [name], am serving as [name] scribe for Dr. Arthea [name].  I'm seeing this patient by the request  of:  Kuneff, Renee A, DO  CC: Low back pain  YEP:Dlagzrupcz  10/25/2023 Continue to have hypercalcemia past medical history colon cancer status post removal as well as.    Thyroid  ultrasound did not show any true adenoma but SPECT test is necessary.  Patient is having more shortness of breath will track impaired renal function previously seen before.  Will also get [name] chest x-ray.  May need [name] CT of the chest, abdomen and pelvis if this continues.     Updated 11/10/2023 Clayton [name] is [name] 78 y.o. male coming in with complaint of back pain. He has been fatigued but his knees are doing ok. Sore in the joints but not pain like it has been before.   Lumbar spine pain resolves within 20 minutes of becoming active in the morning.        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   CPAP (continuous positive airway pressure) dependence    Dysrhythmia    PAF- hx of 7 years ago    Gilbert's disease    HLD (hyperlipidemia)    Hypertension 05/29/2010   ECHO-EF 67% wnl   Meniere disease    Obesity    OSA on CPAP 08/20/2012   Palpitations    S/P right total hip arthroplasty 08/12/2020   Sleep apnea 09/14/2004   [facility]- Dr. fernand; CPAP titration 11/12/04- Dr. fernand   Squamous cell carcinoma of skin of neck    right; 2023   Past Surgical History:  Procedure Laterality Date   ATRIAL FIBRILLATION ABLATION N/[name] 09/10/2020   Procedure: ATRIAL FIBRILLATION ABLATION;  Surgeon: Inocencio Soyla Lunger, MD;  Location: [facility]ASIVE [facility];  Service: Cardiovascular;   Laterality: N/[name];   CARDIAC CATHETERIZATION  11/03/2005   CARDIOVERSION N/[name] 09/26/2018   Procedure: CARDIOVERSION;  Surgeon: Barbaraann Darryle Ned, MD;  Location: [facility] ENDOSCOPY;  Service: Endoscopy;  Laterality: N/[name];   CARDIOVERSION N/[name] 03/13/2019   Procedure: CARDIOVERSION;  Surgeon: Raford Riggs, MD;  Location: [facility] ENDOSCOPY;  Service: Cardiovascular;  Laterality: N/[name];   CARDIOVERSION N/[name] 03/03/2021   Procedure: CARDIOVERSION;  Surgeon: Delford Maude BROCKS, MD;  Location: [facility] ENDOSCOPY;  Service: Cardiovascular;  Laterality: N/[name];   CATARACT EXTRACTION  01/19/2012   KNEE ARTHROSCOPY     left knee- 2002    LAPAROSCOPIC PARTIAL RIGHT COLECTOMY Right 12/21/2021   Procedure: LAPAROSCOPIC RIGHT HEMICOLECTOMY;  Surgeon: Teresa Lonni HERO, MD;  Location: [facility] ORS;  Service: General;  Laterality: Right;   TOTAL HIP ARTHROPLASTY  06/08/2011   Procedure: TOTAL HIP ARTHROPLASTY ANTERIOR APPROACH;  Surgeon: Donnice JONETTA Car, MD;  Location: [facility] ORS;  Service: Orthopedics;  Laterality: Left;   TOTAL HIP ARTHROPLASTY Right 08/12/2020   Procedure: TOTAL HIP ARTHROPLASTY ANTERIOR APPROACH;  Surgeon: Car Donnice, MD;  Location: [facility] ORS;  Service: Orthopedics;  Laterality: Right;   Social History   Socioeconomic History   Marital status: Married    Spouse name: Tabitha Tupper   Number of children: 2   Years of education: Not on file   Highest  education level: Not on file  Occupational History   Occupation: Retired  Tobacco Use   Smoking status: Never   Smokeless tobacco: Never  Vaping Use   Vaping status: Never Used  Substance and Sexual Activity   Alcohol use: No   Drug use: No   Sexual activity: Yes    Birth control/protection: None  Other Topics Concern   Not on file  Social History Narrative   Married. Slater Constant.Takes care of his mother-in-law as well.    Retired, Manufacturing engineer.    Drinks caffeine beverages. Takes [name] daily vitamin.   Exercises routinely.    Smoke detector in the home.     Ambulates independently.    2-sons   Social Drivers of Corporate investment banker Strain: Low Risk  (04/14/2022)   Overall Financial Resource Strain (CARDIA)    Difficulty of Paying Living Expenses: Not hard at all  Food Insecurity: Low Risk  (04/28/2023)   Received from [facility]   Hunger Vital Sign    Within the past 12 months, you worried that your food would run out before you got money to buy more: Never true    Within the past 12 months, the food you bought just didn't last and you didn't have money to get more. : Never true  Transportation Needs: No Transportation Needs (04/28/2023)   Received from [facility]    In the past 12 months, has lack of reliable transportation kept you from medical appointments, meetings, work or from getting things needed for daily living? : No  Physical Activity: Sufficiently Active (04/14/2022)   Exercise Vital Sign    Days of Exercise per Week: 7 days    Minutes of Exercise per Session: 50 min  Stress: No Stress Concern Present (04/14/2022)   [employer] of Occupational Health - Occupational Stress Questionnaire    Feeling of Stress : Not at all  Social Connections: Unknown (11/16/2022)   Received from [employer]   Social Network    Social Network: Not on file   No Known Allergies Family History  Problem Relation Age of Onset   Hypertension Mother    Stroke Mother    Diabetes Mother    Hyperlipidemia Mother    Obesity Mother    Arthritis Father    Heart disease Father    Other Father        gilberts   Diabetes Sister    Stroke Maternal Grandmother        11/25/2052 died    Heart disease Maternal Grandfather    Hypertension Maternal Grandfather    Diabetes Maternal Grandfather    Heart disease Paternal Grandfather     Current Outpatient Medications (Endocrine & Metabolic):    metFORMIN  (GLUCOPHAGE ) 500 MG tablet, TAKE 1 TABLET BY MOUTH EVERY DAY WITH BREAKFAST  Current Outpatient Medications  (Cardiovascular):    amiodarone  (PACERONE ) 200 MG tablet, Take 200 mg by mouth.   amLODipine  (NORVASC ) 10 MG tablet, TAKE 1 TABLET BY MOUTH EVERY DAY   ezetimibe  (ZETIA ) 10 MG tablet, TAKE 1 TABLET (10 MG TOTAL) BY MOUTH IN THE MORNING   metoprolol  succinate (TOPROL -XL) 50 MG 24 hr tablet, TAKE 100 MG (2 TABLETS) BY MOUTH IN THE MORNING, AND 75 MG (1.5 TABLETS) IN THE EVENING.   olmesartan  (BENICAR ) 20 MG tablet, Take 1 tablet (20 mg total) by mouth daily.   Pitavastatin  Calcium  2 MG TABS, Take 1 tablet (2 mg total) by mouth daily. In the evening  spironolactone  (ALDACTONE ) 25 MG tablet, Take 1 tablet (25 mg total) by mouth daily.    Current Outpatient Medications (Hematological):    apixaban  (ELIQUIS ) 5 MG TABS tablet, Take 1 tablet (5 mg total) by mouth 2 (two) times daily.   cyanocobalamin (VITAMIN B12) 100 MCG tablet, Take 500 mcg by mouth daily. Patient takes [name] gummie   ferrous sulfate  325 (65 FE) MG EC tablet, Take 325 mg by mouth in the morning and at bedtime.  Current Outpatient Medications (Other):    Coenzyme Q10 200 MG capsule, Take 200 mg by mouth at bedtime.   Multiple Vitamins-Minerals (MULTI-VITAMIN/MINERALS PO), Take by mouth daily. Takes two in the AM   tamsulosin  (FLOMAX ) 0.4 MG CAPS capsule, Take 0.4 mg by mouth in the morning.   triamcinolone  cream (KENALOG ) 0.5 %, APPLY 1 APPLICATION TOPICALLY 2 (TWO) TIMES DAILY. TO AFFECTED AREAS.   Zinc Sulfate (ZINC 15 PO), Take 30 mcg by mouth. Patient takes [name] gummy   Reviewed prior external information including notes and imaging from  primary care provider As well as notes that were available from care everywhere and other healthcare systems.  Past medical history, social, surgical and family history all reviewed in electronic medical record.  No pertanent information unless stated regarding to the chief complaint.   Review of Systems:  No , visual changes, nausea, vomiting, diarrhea, constipation, , abdominal pain, skin  rash, fevers, chills, night sweats, weight loss, swollen lymph nodes, body aches, joint swelling, chest pain, shortness of breath, mood changes. POSITIVE muscle aches, fatigue, headaches, dizziness  Objective  Blood pressure 128/82, pulse 87, height 6' 3 (1.905 m), weight 271 lb (122.9 kg), SpO2 98%.   General: No apparent distress alert and oriented x3 mood and affect normal, dressed appropriately.  HEENT: Pupils equal, extraocular movements intact fullness of the neck noted but no true mass appreciated Respiratory: Patient's speak in full sentences and does not appear short of breath  Cardiovascular: No lower extremity edema, non tender, no erythema  Low back does have some loss of lordosis noted.  Tightness noted more in the thoracolumbar juncture.  Osteopathic findings C6 flexed rotated and side bent left T3 extended rotated and side bent right inhaled third rib T9 extended rotated and side bent left L2 flexed rotated and side bent right L3 flexed rotated and side bent left Sacrum right on right     Impression and Recommendations:    Primary hyperparathyroidism Patient continues to have increasing hypercalcemia with increasing PTH.  Consistent with likely [name] primary hyperparathyroidism, continue to wait for the PET scan at the moment but will refer patient to general surgery as well.  Continuing to have consistent fatigue, stomach issues, muscle pains, as well as even some psychiatric undertones.  Increase activity slowly otherwise.  Follow-up with me again 6 to 8 weeks.  Degenerative disc disease, lumbar Known arthritic changes of the back that patient is having more of an exacerbation secondary to discomfort and pain.  Discussed which activities to do and which ones to avoid.  Increase activity slowly.  Discussed icing regimen.  Follow-up again in 6 to 8 weeks.     Decision today to treat with OMT was based on Physical Exam  After verbal consent patient was treated with  ME,  FPR techniques in cervical, thoracic, rib, lumbar and sacral areas, all areas are chronic   Patient tolerated the procedure well with improvement in symptoms  Patient given exercises, stretches and lifestyle modifications  See medications in patient instructions  if given  Patient will follow up in 4-8 weeks  The above documentation has been reviewed and is accurate and complete Clayton Rossie M Brek Reece, DO

## 2023-11-10 ENCOUNTER — Ambulatory Visit: Admitting: Family Medicine

## 2023-11-10 VITALS — BP 128/82 | HR 87 | Ht 75.0 in | Wt 271.0 lb

## 2023-11-10 DIAGNOSIS — E210 Primary hyperparathyroidism: Secondary | ICD-10-CM | POA: Diagnosis not present

## 2023-11-10 DIAGNOSIS — M9903 Segmental and somatic dysfunction of lumbar region: Secondary | ICD-10-CM

## 2023-11-10 DIAGNOSIS — M9904 Segmental and somatic dysfunction of sacral region: Secondary | ICD-10-CM

## 2023-11-10 DIAGNOSIS — M9908 Segmental and somatic dysfunction of rib cage: Secondary | ICD-10-CM | POA: Diagnosis not present

## 2023-11-10 DIAGNOSIS — M9902 Segmental and somatic dysfunction of thoracic region: Secondary | ICD-10-CM

## 2023-11-10 DIAGNOSIS — M9901 Segmental and somatic dysfunction of cervical region: Secondary | ICD-10-CM | POA: Diagnosis not present

## 2023-11-10 DIAGNOSIS — R7989 Other specified abnormal findings of blood chemistry: Secondary | ICD-10-CM

## 2023-11-10 DIAGNOSIS — M51362 Other intervertebral disc degeneration, lumbar region with discogenic back pain and lower extremity pain: Secondary | ICD-10-CM | POA: Diagnosis not present

## 2023-11-10 NOTE — Patient Instructions (Addendum)
 Referral to general surgery See me back 7-8 weeks

## 2023-11-10 NOTE — Assessment & Plan Note (Signed)
 Patient continues to have increasing hypercalcemia with increasing PTH.  Consistent with likely a primary hyperparathyroidism, continue to wait for the PET scan at the moment but will refer patient to general surgery as well.  Continuing to have consistent fatigue, stomach issues, muscle pains, as well as even some psychiatric undertones.  Increase activity slowly otherwise.  Follow-up with me again 6 to 8 weeks.

## 2023-11-10 NOTE — Assessment & Plan Note (Signed)
 Known arthritic changes of the back that patient is having more of an exacerbation secondary to discomfort and pain.  Discussed which activities to do and which ones to avoid.  Increase activity slowly.  Discussed icing regimen.  Follow-up again in 6 to 8 weeks.

## 2023-11-11 DIAGNOSIS — G4733 Obstructive sleep apnea (adult) (pediatric): Secondary | ICD-10-CM | POA: Diagnosis not present

## 2023-11-15 ENCOUNTER — Ambulatory Visit (INDEPENDENT_AMBULATORY_CARE_PROVIDER_SITE_OTHER): Admitting: Psychology

## 2023-11-15 DIAGNOSIS — F411 Generalized anxiety disorder: Secondary | ICD-10-CM

## 2023-11-15 NOTE — Progress Notes (Unsigned)
 [facility] Counselor/Therapist Progress Note  Patient ID: TAAVI HOOSE, MRN: [id number]    Date: 11/15/23  Time Spent: 1:54 pm - 2:35 pm : 41 Minutes  Treatment Type: Individual Therapy.  Reported Symptoms: Anxiety  Mental Status Exam: Appearance:  Casual     Behavior: Appropriate  Motor: Normal  Speech/Language:  Clear and Coherent  Affect: Congruent  Mood: anxious  Thought process: normal  Thought content:   WNL  Sensory/Perceptual disturbances:   WNL  Orientation: oriented to person, place, time/date, and situation  Attention: Good  Concentration: Good  Memory: WNL  Fund of knowledge:  Good  Insight:   Good  Judgment:  Good  Impulse Control: Good   Risk Assessment: Danger to Self:  No Self-injurious Behavior: No Danger to Others: No Duty to Warn:no Physical Aggression / Violence:No  Access to Firearms a concern: No  Gang Involvement:No   Subjective:   Maple LITTIE Constant participated in the session, in person in the office with the therapist, and consented to treatment Lilton reviewed the events of the past week. [name] noted recent labs possibly related to a thyroid  issue. He endorsed various symptoms including lethargy, loss of appetite, nausea, and increased urination.  He noted his worry that this could be a more serious health issue including possible cancer. [name] experienced a diagnosis of cancer, in the past, which was addressed successfully. He noted this process being more stressful and noted leaning on the coping tools we had previously reviewed during past sessions and practicing, between sessions. We worked on processing his areas of control and lack of control. He noted being hopeful that addressing this health issue will aid in addressing his lethargy which has noticed in various areas, highlighting his lethargy when playing golf and, more recently, day-to-day. He noted his providers, and his wife, have been supportive throughout this process.  We worked on processing the positive events of the past month during the session. Therapist validated Jerry's feelings and experience, during the session, and provided supportive therapy. A follow-up was scheduled for continued treatment, which he benefits from.   Interventions: CBT  Diagnosis:   Generalized anxiety disorder  Psychiatric Treatment: No , NA  Treatment Plan:  Client Abilities/Strengths Finlay is intelligent, self-aware, and motivated for change.   Support System: Family and friends.   Client Treatment Preferences OPT  Client Statement of Needs Tyrann would like to improve his health, continued building a mindfulness practice, managing his mood proactively and consistently, process past events, and bolster coping skills. He noted an interest a need to process his propensity to get things done.  Additionally, he noted a need to explore having to pay people for services that he previously would do himself. Additionally, he noted a need to be be mindful of his diligence in various areas and noted a need to be more mindful of this and choosing when to pursue this or delay it for a later date.   Treatment Level Weekly  Symptoms  Anxiety: Feeling anxious, difficulty managing worry, and worrying about different things.   (Status: maintained)  Goals:   Maple experiences symptoms of anxiety.   Treatment plan signed and available on s-drive:  No, pending signature via MyChart.  JAMEER STORIE was sent the treatment plan signature form on 09/05/23.   Target Date: 09/04/24 Frequency: Weekly  Progress: 0 Modality: individual    Therapist will provide referrals for additional resources as appropriate.  Therapist will provide psycho-education regarding Kylian's diagnosis and corresponding treatment approaches  and interventions. [name], LCSW will support the patient's ability to achieve the goals identified. will employ CBT, BA, Problem-solving, Solution Focused,  Mindfulness,  coping skills, & other evidenced-based practices will be used to promote progress towards healthy functioning to help manage decrease symptoms associated with his diagnosis.   Reduce overall level, frequency, and intensity of the feelings of anxiety evidenced by decreased overall symptoms from 6 to 7 days/week to 0 to 1 days/week per client report for at least 3 consecutive months. Verbally express understanding of the relationship between feelings of anxiety and their impact on thinking patterns and behaviors. Verbalize an understanding of the role that distorted thinking plays in creating fears, excessive worry, and ruminations.  [name] participated in the creation of the treatment plan)    [name], LCSW

## 2023-11-18 ENCOUNTER — Ambulatory Visit: Admitting: Family Medicine

## 2023-11-29 ENCOUNTER — Other Ambulatory Visit (HOSPITAL_COMMUNITY): Payer: Self-pay | Admitting: Surgery

## 2023-11-29 DIAGNOSIS — E042 Nontoxic multinodular goiter: Secondary | ICD-10-CM | POA: Diagnosis not present

## 2023-11-29 DIAGNOSIS — E210 Primary hyperparathyroidism: Secondary | ICD-10-CM | POA: Diagnosis not present

## 2023-11-29 DIAGNOSIS — E213 Hyperparathyroidism, unspecified: Secondary | ICD-10-CM

## 2023-12-01 ENCOUNTER — Encounter: Payer: Self-pay | Admitting: Family Medicine

## 2023-12-02 ENCOUNTER — Ambulatory Visit: Admitting: Family Medicine

## 2023-12-02 ENCOUNTER — Encounter: Payer: Self-pay | Admitting: Family Medicine

## 2023-12-02 VITALS — BP 134/90 | HR 79 | Temp 98.3°F | Wt 279.0 lb

## 2023-12-02 DIAGNOSIS — G4733 Obstructive sleep apnea (adult) (pediatric): Secondary | ICD-10-CM | POA: Diagnosis not present

## 2023-12-02 DIAGNOSIS — I4819 Other persistent atrial fibrillation: Secondary | ICD-10-CM | POA: Diagnosis not present

## 2023-12-02 DIAGNOSIS — R7303 Prediabetes: Secondary | ICD-10-CM | POA: Diagnosis not present

## 2023-12-02 DIAGNOSIS — N1831 Chronic kidney disease, stage 3a: Secondary | ICD-10-CM | POA: Diagnosis not present

## 2023-12-02 LAB — POCT GLYCOSYLATED HEMOGLOBIN (HGB A1C)
HbA1c POC (<> result, manual entry): 6.1 % (ref 4.0–5.6)
HbA1c, POC (controlled diabetic range): 6.1 % (ref 0.0–7.0)
HbA1c, POC (prediabetic range): 6.1 % (ref 5.7–6.4)
Hemoglobin A1C: 6.1 % — AB (ref 4.0–5.6)

## 2023-12-02 MED ORDER — METFORMIN HCL 500 MG PO TABS: ORAL_TABLET | ORAL | 2 refills | Status: AC

## 2023-12-02 NOTE — Patient Instructions (Signed)
 Return in about 6 months (around 05/21/2024) for cpe (20 min), Routine chronic condition follow-up.        Great to see you today.  I have refilled the medication(s) we provide.   If labs were collected or images ordered, we will inform you of  results once we have received them and reviewed. We will contact you either by echart message, or telephone call.  Please give ample time to the testing facility, and our office to run,  receive and review results. Please do not call inquiring of results, even if you can see them in your chart. We will contact you as soon as we are able. If it has been over 1 week since the test was completed, and you have not yet heard from us , then please call us .    - echart message- for normal results that have been seen by the patient already.   - telephone call: abnormal results or if patient has not viewed results in their echart.  If a referral to a specialist was entered for you, please call us  in 2 weeks if you have not heard from the specialist office to schedule.

## 2023-12-02 NOTE — Progress Notes (Signed)
 Clayton [name] , 07/01/1945, 78 y.o., male MRN: [id number] Patient Care Team    Relationship Specialty Notifications Start End  Catherine Charlies LABOR, DO PCP - General Family Medicine  06/09/15   Burnard Debby LABOR, MD (Inactive) Consulting Physician Cardiology  06/09/15   Octavia Bruckner, MD Consulting Physician Ophthalmology  06/09/15   Robinson Idol, MD Consulting Physician Ophthalmology  06/09/15   Court Pulling, MD Consulting Physician Dermatology  06/09/15   Thaddeus Locus, MD Consulting Physician Otolaryngology  06/09/15   Ernie Cough, MD Consulting Physician Orthopedic Surgery  06/09/15   Cam Morene ORN, MD Attending Physician Urology  07/27/21   Legrand Victory LITTIE DOUGLAS, MD Consulting Physician Gastroenterology  05/20/23   Inocencio Soyla Lunger, MD Consulting Physician Cardiology  05/20/23   Micah Ludwig, LCSW Social Worker Psychology  05/20/23   Court Pulling, MD Referring Physician Dermatology  12/02/23   Francyne Headland, MD Consulting Physician Cardiology  12/02/23     Chief Complaint  Patient presents with   Prediabetes    Pt is not fasting.      Subjective: Clayton [name] is a 78 y.o. Pt presents for an OV chronic condition management Medication reconciliation completed today. Past medical history updated where appropriate.  Prediabetes/morbid obesity: Patient reports compliance with metformin  500 mg daily.Patient denies dizziness, hyperglycemic or hypoglycemic events. Patient denies numbness, tingling in the extremities or nonhealing wounds of feet.  Having difficulty maintaining his normal exercise regimen secondary to fatigue from hyperparathyroidism and discomfort from his arthritis.  Persistent atrial fibrillation (HCC) (Primary) Patient follows with cardiology routinely for condition.  Status post ablation. On beta-blocker and Eliquis .  Chronic kidney disease, stage 3a (HCC) GFR has been stable over the last 2 years at her baseline GFR ~47       12/02/2023   10:34 AM 05/20/2023    9:40 AM 04/14/2022    7:56 AM 01/01/2022   10:25 AM 07/27/2021    9:04 AM  Depression screen PHQ 2/9  Decreased Interest 0 0 0 0 0  Down, Depressed, Hopeless 0 0 0 0 0  PHQ - 2 Score 0 0 0 0 0  Altered sleeping 0      Tired, decreased energy 0      Change in appetite 0      Feeling bad or failure about yourself  0      Trouble concentrating 0      Moving slowly or fidgety/restless 0      Suicidal thoughts 0      PHQ-9 Score 0      Difficult doing work/chores Not difficult at all        No Known Allergies Social History   Social History Narrative   Married. Slater Constant.Takes care of his mother-in-law as well.    Retired, manufacturing engineer.    Drinks caffeine beverages. Takes a daily vitamin.   Exercises routinely.    Smoke detector in the home.    Ambulates independently.    2-sons   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   CPAP (continuous positive airway pressure) dependence    Dysrhythmia    PAF- hx of 7 years ago    Gilbert's disease    History of colon cancer 06/08/2023  HLD (hyperlipidemia)    Hypertension 05/29/2010   ECHO-EF 67% wnl   Meniere disease    Obesity    OSA on CPAP 08/20/2012   Palpitations    S/P right total hip arthroplasty 08/12/2020   SCCA (squamous cell carcinoma) of skin 01/21/2022   Sleep apnea 09/14/2004   [facility]- Dr. fernand; CPAP titration 11/12/04- Dr. fernand   Squamous cell carcinoma of skin of neck    right; 2023   Stress 10/06/2021   Past Surgical History:  Procedure Laterality Date   ATRIAL FIBRILLATION ABLATION N/A 09/10/2020   Procedure: ATRIAL FIBRILLATION ABLATION;  Surgeon: Inocencio Soyla Lunger, MD;  Location: [facility]IVE [facility];  Service: Cardiovascular;  Laterality: N/A;    CARDIAC CATHETERIZATION  11/03/2005   CARDIOVERSION N/A 09/26/2018   Procedure: CARDIOVERSION;  Surgeon: Barbaraann Darryle Ned, MD;  Location: [facility] ENDOSCOPY;  Service: Endoscopy;  Laterality: N/A;   CARDIOVERSION N/A 03/13/2019   Procedure: CARDIOVERSION;  Surgeon: Raford Riggs, MD;  Location: [facility] ENDOSCOPY;  Service: Cardiovascular;  Laterality: N/A;   CARDIOVERSION N/A 03/03/2021   Procedure: CARDIOVERSION;  Surgeon: Delford Maude BROCKS, MD;  Location: [facility] ENDOSCOPY;  Service: Cardiovascular;  Laterality: N/A;   CATARACT EXTRACTION  01/19/2012   KNEE ARTHROSCOPY     left knee- 2002    LAPAROSCOPIC PARTIAL RIGHT COLECTOMY Right 12/21/2021   Procedure: LAPAROSCOPIC RIGHT HEMICOLECTOMY;  Surgeon: Teresa Lonni HERO, MD;  Location: [facility] ORS;  Service: General;  Laterality: Right;   TOTAL HIP ARTHROPLASTY  06/08/2011   Procedure: TOTAL HIP ARTHROPLASTY ANTERIOR APPROACH;  Surgeon: Donnice JONETTA Car, MD;  Location: [facility] ORS;  Service: Orthopedics;  Laterality: Left;   TOTAL HIP ARTHROPLASTY Right 08/12/2020   Procedure: TOTAL HIP ARTHROPLASTY ANTERIOR APPROACH;  Surgeon: Car Donnice, MD;  Location: [facility] ORS;  Service: Orthopedics;  Laterality: Right;   Family History  Problem Relation Age of Onset   Hypertension Mother    Stroke Mother    Diabetes Mother    Hyperlipidemia Mother    Obesity Mother    Arthritis Father    Heart disease Father    Other Father        gilberts   Diabetes Sister    Stroke Maternal Grandmother        56 died    Heart disease Maternal Grandfather    Hypertension Maternal Grandfather    Diabetes Maternal Grandfather    Heart disease Paternal Grandfather    Allergies as of 12/02/2023   No Known Allergies      Medication List        Accurate as of December 02, 2023 11:05 AM. If you have any questions, ask your nurse or doctor.          STOP taking these medications    amiodarone  200 MG tablet Commonly known as: PACERONE  Stopped by: Charlies Bellini        TAKE these medications    amLODipine  10 MG tablet Commonly known as: NORVASC  TAKE 1 TABLET BY MOUTH EVERY DAY   apixaban  5 MG Tabs tablet Commonly known as: Eliquis  Take 1 tablet (5 mg total) by mouth 2 (two) times daily.   Coenzyme Q10 200 MG capsule Take 200 mg by mouth at bedtime.   cyanocobalamin 100 MCG tablet Commonly known as: VITAMIN B12 Take 500 mcg by mouth daily. Patient takes a gummie   ezetimibe  10 MG tablet Commonly known as: ZETIA  TAKE 1 TABLET (10 MG TOTAL) BY MOUTH IN THE MORNING   ferrous sulfate   325 (65 FE) MG EC tablet Take 325 mg by mouth in the morning and at bedtime.   metFORMIN  500 MG tablet Commonly known as: GLUCOPHAGE  TAKE 1 TABLET BY MOUTH EVERY DAY WITH BREAKFAST   metoprolol  succinate 50 MG 24 hr tablet Commonly known as: TOPROL -XL TAKE 100 MG (2 TABLETS) BY MOUTH IN THE MORNING, AND 75 MG (1.5 TABLETS) IN THE EVENING.   MULTI-VITAMIN/MINERALS PO Take by mouth daily. Takes two in the AM   olmesartan  20 MG tablet Commonly known as: BENICAR  Take 1 tablet (20 mg total) by mouth daily.   Pitavastatin  Calcium  2 MG Tabs Take 1 tablet (2 mg total) by mouth daily. In the evening   spironolactone  25 MG tablet Commonly known as: ALDACTONE  Take 1 tablet (25 mg total) by mouth daily.   tamsulosin  0.4 MG Caps capsule Commonly known as: FLOMAX  Take 0.4 mg by mouth in the morning.   triamcinolone  cream 0.5 % Commonly known as: KENALOG  APPLY 1 APPLICATION TOPICALLY 2 (TWO) TIMES DAILY. TO AFFECTED AREAS.   ZINC 15 PO Take 30 mcg by mouth. Patient takes a gummy        All past medical history, surgical history, allergies, family history, immunizations andmedications were updated in the EMR today and reviewed under the history and medication portions of their EMR.     Review of Systems  All other systems reviewed and are negative.  Negative, with the exception of above mentioned in HPI   Objective:  BP (!) 134/90   Pulse 79    Temp 98.3 F (36.8 C)   Wt 279 lb (126.6 kg)   SpO2 98%   BMI 34.87 kg/m  Body mass index is 34.87 kg/m. Physical Exam Vitals and nursing note reviewed. Exam conducted with a chaperone present.  Constitutional:      General: He is not in acute distress.    Appearance: Normal appearance. He is not ill-appearing, toxic-appearing or diaphoretic.  HENT:     Head: Normocephalic and atraumatic.  Eyes:     General: No scleral icterus.       Right eye: No discharge.        Left eye: No discharge.     Extraocular Movements: Extraocular movements intact.     Pupils: Pupils are equal, round, and reactive to light.  Cardiovascular:     Rate and Rhythm: Normal rate and regular rhythm.     Heart sounds: No murmur heard. Pulmonary:     Effort: Pulmonary effort is normal. No respiratory distress.     Breath sounds: Normal breath sounds. No wheezing, rhonchi or rales.  Musculoskeletal:     Right lower leg: Edema present.     Left lower leg: Edema present.     Comments: Trace edema bilaterally  Skin:    General: Skin is warm.     Findings: No rash.  Neurological:     Mental Status: He is alert and oriented to person, place, and time. Mental status is at baseline.  Psychiatric:        Mood and Affect: Mood normal.        Behavior: Behavior normal.        Thought Content: Thought content normal.        Judgment: Judgment normal.     No results found. No results found. Results for orders placed or performed in visit on 12/02/23 (from the past 24 hours)  POCT HgB A1C     Status: Abnormal   Collection Time: 12/02/23 10:41  AM  Result Value Ref Range   Hemoglobin A1C 6.1 (A) 4.0 - 5.6 %   HbA1c POC (<> result, manual entry) 6.1 4.0 - 5.6 %   HbA1c, POC (prediabetic range) 6.1 5.7 - 6.4 %   HbA1c, POC (controlled diabetic range) 6.1 0.0 - 7.0 %    Assessment/Plan: Clayton [name] is a 78 y.o. male present for OV for chronic condition management Prediabetes/morbid obesity:   Stable Continue metformin  500 mg daily Diabetic diet encouraged Routine exercise encouraged 5.6>5.9>6.2>6.1 A1c today  Persistent atrial fibrillation (HCC) (Primary) Patient follows with cardiology routinely for condition.   Blood pressure over goal today, he has an appointment with his cardiology team next week, which manages his hypertension. OSA: Compliant with CPAP Chronic kidney disease, stage 3a (HCC) GFR has been stable over the last 2 years at her baseline GFR ~47 Labs up-to-date 10/2023  Avoid chronic NSAID use.   Reviewed expectations re: course of current medical issues. Discussed self-management of symptoms. Outlined signs and symptoms indicating need for more acute intervention. Patient verbalized understanding and all questions were answered. Patient received an After-Visit Summary.    Orders Placed This Encounter  Procedures   POCT HgB A1C   Meds ordered this encounter  Medications   metFORMIN  (GLUCOPHAGE ) 500 MG tablet    Sig: TAKE 1 TABLET BY MOUTH EVERY DAY WITH BREAKFAST    Dispense:  90 tablet    Refill:  2   Referral Orders  No referral(s) requested today     Note is dictated utilizing voice recognition software. Although note has been proof read prior to signing, occasional typographical errors still can be missed. If any questions arise, please do not hesitate to call for verification.   electronically signed by:  Charlies Bellini, DO  [facility] - [facility]

## 2023-12-05 DIAGNOSIS — E213 Hyperparathyroidism, unspecified: Secondary | ICD-10-CM | POA: Diagnosis not present

## 2023-12-06 ENCOUNTER — Ambulatory Visit: Admitting: Psychology

## 2023-12-07 ENCOUNTER — Encounter (HOSPITAL_COMMUNITY)
Admission: RE | Admit: 2023-12-07 | Discharge: 2023-12-07 | Disposition: A | Discharge status: Home / Self Care | Source: Ambulatory Visit | Attending: Surgery | Admitting: Surgery

## 2023-12-07 DIAGNOSIS — E213 Hyperparathyroidism, unspecified: Secondary | ICD-10-CM | POA: Diagnosis not present

## 2023-12-07 MED ORDER — TECHNETIUM TC 99M SESTAMIBI - CARDIOLITE
25.1000 | Freq: Once | INTRAVENOUS | Status: AC | PRN
  Administered 2023-12-07: 25.1 via INTRAVENOUS

## 2023-12-08 NOTE — Progress Notes (Signed)
 Electrophysiology Office Note:   Date:  12/09/2023  ID:  Clayton [name], DOB [birth year], MRN [id number]  Primary Cardiologist: Jerel Balding, MD Primary Heart Failure: None Electrophysiologist: Dashay Giesler Gladis Norton, MD      History of Present Illness:   Clayton [name] is a 78 y.o. male with h/o atrial fibrillation, hypertension, sleep apnea seen today for routine electrophysiology followup.   Discussed the use of AI scribe software for clinical note transcription with the patient, who gave verbal consent to proceed.  History of Present Illness Clayton [name] is a 78 year old male with atrial fibrillation who presents for follow-up after ablation.   He underwent an ablation for atrial fibrillation in 2022 and has experienced no recurrence of symptoms since the procedure. He monitors his blood pressure at home, which is typically around 125/75 mmHg.  For the past nine months, he has experienced fatigue, which he describes as different from typical fatigue. He also reports increased frequency of urination, now getting up once to three times at night, which is unusual for him. During his annual check-up, a high calcium  level was detected in his blood, leading to further investigation of his parathyroid  glands. He is currently under the care of Dr. Cyrilla at [facility], who performed a nuclear scan yesterday to assess for parathyroid  enlargement. He notes a loss of appetite and mentions that the fatigue has impacted his ability to play golf, as he can no longer complete 18 holes.  He has a history of obesity and prediabetes. He has been working on weight loss with the assistance of Dr. Verdon.   he denies chest pain, palpitations, dyspnea, PND, orthopnea, nausea, vomiting, dizziness, syncope, edema, weight gain, or early satiety.   Review of systems complete and found to be negative unless listed in HPI.   EP Information / Studies Reviewed:    EKG is not ordered today. EKG from  06/28/2023 reviewed which showed sinus rhythm, right bundle branch block        Risk Assessment/Calculations:    CHA2DS2-VASc Score = 3   This indicates a 3.2% annual risk of stroke. The patient's score is based upon: CHF History: 0 HTN History: 1 Diabetes History: 0 Stroke History: 0 Vascular Disease History: 0 Age Score: 2 Gender Score: 0            Physical Exam:   VS:  BP (!) 156/90 (BP Location: Right Arm, Patient Position: Sitting, Cuff Size: Large)   Pulse 88   Ht 6' 3 (1.905 m)   Wt 275 lb (124.7 kg)   SpO2 96%   BMI 34.37 kg/m    Wt Readings from Last 3 Encounters:  12/09/23 275 lb (124.7 kg)  12/02/23 279 lb (126.6 kg)  11/10/23 271 lb (122.9 kg)     GEN: Well nourished, well developed in no acute distress NECK: No JVD; No carotid bruits CARDIAC: Regular rate and rhythm, no murmurs, rubs, gallops RESPIRATORY:  Clear to auscultation without rales, wheezing or rhonchi  ABDOMEN: Soft, non-tender, non-distended EXTREMITIES:  No edema; No deformity   ASSESSMENT AND PLAN:    1.  Persistent atrial fibrillation: Post ablation 09/02/2020.  On metoprolol .  He did require repeat cardioversion post ablation.  He has had no further episodes of atrial fibrillation.  He is feeling well.  He he is happy with his control.  Niasha Devins continue with current management.  2.  Secondary hypercoagulable state: On Eliquis   3.  Hypertension: Elevated today.  He states he has  whitecoat hypertension and his blood pressure is normal at home.  No changes.  4.  Obstructive sleep apnea: CPAP compliance encouraged  Follow up with EP Team in 12 months  Signed, Devora Tortorella Gladis Norton, MD

## 2023-12-08 NOTE — Result Encounter Note (Signed)
 24 hour urine calcium  normal.  Await nuclear scan results.  Krystal Spinner, MD [facility] Office: [phone]

## 2023-12-09 ENCOUNTER — Ambulatory Visit: Attending: Cardiology | Admitting: Cardiology

## 2023-12-09 ENCOUNTER — Encounter: Payer: Self-pay | Admitting: Cardiology

## 2023-12-09 VITALS — BP 156/90 | HR 88 | Ht 75.0 in | Wt 275.0 lb

## 2023-12-09 DIAGNOSIS — I4819 Other persistent atrial fibrillation: Secondary | ICD-10-CM | POA: Diagnosis not present

## 2023-12-09 DIAGNOSIS — I10 Essential (primary) hypertension: Secondary | ICD-10-CM | POA: Diagnosis not present

## 2023-12-09 DIAGNOSIS — D6869 Other thrombophilia: Secondary | ICD-10-CM

## 2023-12-09 DIAGNOSIS — G4733 Obstructive sleep apnea (adult) (pediatric): Secondary | ICD-10-CM | POA: Diagnosis not present

## 2023-12-12 ENCOUNTER — Ambulatory Visit: Payer: Self-pay | Admitting: Surgery

## 2023-12-12 NOTE — Progress Notes (Signed)
 Nuclear scan localizes a left superior parathyroid  adenoma.  Will enter orders to proceed with minimally invasive outpatient surgery for parathyroidectomy as we discussed in the office.  Burnard - please send orders to schedulers to contact the patient.  Krystal Spinner, MD [facility] practice Office: [phone]

## 2023-12-14 ENCOUNTER — Telehealth (HOSPITAL_BASED_OUTPATIENT_CLINIC_OR_DEPARTMENT_OTHER): Payer: Self-pay

## 2023-12-14 NOTE — Telephone Encounter (Signed)
   Pre-operative Risk Assessment    Patient Name: Clayton [name]  DOB: 06/13/1945 MRN: [id number]   Date of last office visit: 12/09/2023 with Dr. Inocencio Date of next office visit: 12/28/2023 with Dr. Francyne  Request for Surgical Clearance    Procedure:  Parathyroidectomy   Date of Surgery:  Clearance TBD                                 Surgeon:  Dr. Krystal Spinner Surgeon's Group or Practice Name:  [facility]  Phone number:  [phone] Fax number:  [phone] or [phone] - Burnard Crete, LPN   Type of Clearance Requested:   - Medical  - Pharmacy:  Hold Apixaban  (Eliquis ) -Needs instructions   Type of Anesthesia:  General    Additional requests/questions:  None  Signed, Patrcia Iverson LITTIE   12/14/2023, 5:05 PM

## 2023-12-16 ENCOUNTER — Encounter: Payer: Self-pay | Admitting: Family Medicine

## 2023-12-19 NOTE — Telephone Encounter (Signed)
 Patient with diagnosis of atrial fibrillation on Eliquis  for anticoagulation.    Procedure:  Parathyroidectomy    Date of Surgery:  Clearance TBD    CHA2DS2-VASc Score = 3   This indicates a 3.2% annual risk of stroke. The patient's score is based upon: CHF History: 0 HTN History: 1 Diabetes History: 0 Stroke History: 0 Vascular Disease History: 0 Age Score: 2 Gender Score: 0    CrCl 78 Platelet count 191  Patient has not had an Afib/aflutter ablation in the last 3 months, DCCV within the last 4 weeks or a watchman implanted in the last 45 days   Per office protocol, patient can hold Eliquis  for 2 days prior to procedure.   Patient will not need bridging with Lovenox (enoxaparin) around procedure.  **This guidance is not considered finalized until pre-operative APP has relayed final recommendations.**

## 2023-12-19 NOTE — Telephone Encounter (Signed)
 Pharmacy please advise on holding Eliquis  prior to Parathyroidectomy  scheduled for TBD. Patient has appointment with Dr. Francyne on 12/28/2023. Thank you.

## 2023-12-27 NOTE — Progress Notes (Unsigned)
 [name] [address] [place] [address] Phone: [phone] Subjective:    I'm seeing this patient by the request  of:  Kuneff, Renee A, DO  CC:   YEP:Dlagzrupcz  Clayton [name] is a 78 y.o. male coming in with complaint of back and neck pain. OMT On 11/10/2023. Patient states   Medications patient has been prescribed:   Taking:         Reviewed prior external information including notes and imaging from previsou exam, outside providers and external EMR if available.   As well as notes that were available from care everywhere and other healthcare systems.  Past medical history, social, surgical and family history all reviewed in electronic medical record.  No pertanent information unless stated regarding to the chief complaint.   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   CPAP (continuous positive airway pressure) dependence    Dysrhythmia    PAF- hx of 7 years ago    Gilbert's disease    History of colon cancer 06/08/2023   HLD (hyperlipidemia)    Hypertension 05/29/2010   ECHO-EF 67% wnl   Meniere disease    Obesity    OSA on CPAP 08/20/2012   Palpitations    S/P right total hip arthroplasty 08/12/2020   SCCA (squamous cell carcinoma) of skin 01/21/2022   Sleep apnea 09/14/2004   [facility]- Dr. fernand; CPAP titration 11/12/04- Dr. fernand   Squamous cell carcinoma of skin of neck    right; 2023   Stress 10/06/2021    No Known Allergies   Review of Systems:  No headache, visual changes, nausea, vomiting, diarrhea, constipation, dizziness, abdominal pain, skin rash, fevers, chills, night sweats, weight loss, swollen lymph nodes, body aches, joint swelling, chest pain, shortness of breath, mood changes.  POSITIVE muscle aches  Objective  There were no vitals taken for this visit.   General: No apparent distress alert and oriented x3 mood and affect normal, dressed appropriately.  HEENT: Pupils equal, extraocular movements intact  Respiratory: Patient's speak in full sentences and does not appear short of breath  Cardiovascular: No lower extremity edema, non tender, no erythema  Gait MSK:  Back   Osteopathic findings  C2 flexed rotated and side bent right C6 flexed rotated and side bent left T3 extended rotated and side bent right inhaled rib T9 extended rotated and side bent left L2 flexed rotated and side bent right Sacrum right on right       Assessment and Plan:  No problem-specific Assessment & Plan notes found for this encounter.    Nonallopathic problems  Decision today to treat with OMT was based on Physical Exam  After verbal consent patient was treated with HVLA, ME, FPR techniques in cervical, rib, thoracic, lumbar, and sacral  areas  Patient tolerated the procedure well with improvement in symptoms  Patient given exercises, stretches and lifestyle modifications  See medications in patient instructions if given  Patient will follow up in 4-8 weeks             Note: This dictation was prepared with Dragon dictation along with smaller phrase technology. Any transcriptional errors that result from this process are unintentional.

## 2023-12-28 ENCOUNTER — Ambulatory Visit: Attending: Cardiology | Admitting: Cardiovascular Disease

## 2023-12-28 ENCOUNTER — Encounter: Payer: Self-pay | Admitting: Cardiovascular Disease

## 2023-12-28 VITALS — BP 151/73 | HR 69 | Ht 75.0 in | Wt 275.0 lb

## 2023-12-28 DIAGNOSIS — D6869 Other thrombophilia: Secondary | ICD-10-CM

## 2023-12-28 DIAGNOSIS — N1831 Chronic kidney disease, stage 3a: Secondary | ICD-10-CM

## 2023-12-28 DIAGNOSIS — I4819 Other persistent atrial fibrillation: Secondary | ICD-10-CM

## 2023-12-28 DIAGNOSIS — Z01810 Encounter for preprocedural cardiovascular examination: Secondary | ICD-10-CM

## 2023-12-28 DIAGNOSIS — G4733 Obstructive sleep apnea (adult) (pediatric): Secondary | ICD-10-CM | POA: Diagnosis not present

## 2023-12-28 DIAGNOSIS — I10 Essential (primary) hypertension: Secondary | ICD-10-CM

## 2023-12-28 DIAGNOSIS — E782 Mixed hyperlipidemia: Secondary | ICD-10-CM

## 2023-12-28 DIAGNOSIS — E210 Primary hyperparathyroidism: Secondary | ICD-10-CM
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
## 2023-12-28 NOTE — Patient Instructions (Signed)
 Medication Instructions:  Your physician recommends that you continue on your current medications as directed. Please refer to the Current Medication list given to you today.  *If you need a refill on your cardiac medications before your next appointment, please call your pharmacy*  Lab Work: NONE ordered at this time of appointment   Testing/Procedures: NONE ordered at this time of appointment   Follow-Up: At [facility], you and your health needs are our priority.  As part of our continuing mission to provide you with exceptional heart care, our providers are all part of one team.  This team includes your primary Cardiologist (physician) and Advanced Practice Providers or APPs (Physician Assistants and Nurse Practitioners) who all work together to provide you with the care you need, when you need it.  Your next appointment:   Sleep apt with Dr. Shlomo 6 month Dr. Francyne  We recommend signing up for the patient portal called MyChart.  Sign up information is provided on this After Visit Summary.  MyChart is used to connect with patients for Virtual Visits (Telemedicine).  Patients are able to view lab/test results, encounter notes, upcoming appointments, etc.  Non-urgent messages can be sent to your provider as well.   To learn more about what you can do with MyChart, go to [email or web address].

## 2023-12-29 ENCOUNTER — Ambulatory Visit: Admitting: Family Medicine

## 2023-12-29 ENCOUNTER — Encounter: Payer: Self-pay | Admitting: Family Medicine

## 2023-12-29 VITALS — BP 118/86 | HR 77 | Ht 75.0 in | Wt 269.0 lb

## 2023-12-29 DIAGNOSIS — M9904 Segmental and somatic dysfunction of sacral region: Secondary | ICD-10-CM

## 2023-12-29 DIAGNOSIS — M51362 Other intervertebral disc degeneration, lumbar region with discogenic back pain and lower extremity pain: Secondary | ICD-10-CM | POA: Diagnosis not present

## 2023-12-29 DIAGNOSIS — M9902 Segmental and somatic dysfunction of thoracic region: Secondary | ICD-10-CM

## 2023-12-29 DIAGNOSIS — M9901 Segmental and somatic dysfunction of cervical region: Secondary | ICD-10-CM

## 2023-12-29 DIAGNOSIS — M9903 Segmental and somatic dysfunction of lumbar region: Secondary | ICD-10-CM

## 2023-12-29 NOTE — Assessment & Plan Note (Signed)
 Degenerative disc disease.  Discussed icing regimen and home exercises, which activities to do and which I spoke with.  Increase activity slowly.  No change of medication with patient having hypercalcemia and would be undergoing a parathyroidectomy in the relatively near future.  Follow-up again in 6 to 12 weeks.

## 2023-12-29 NOTE — Patient Instructions (Signed)
 Good to see you!  See you again in 8 weeks

## 2023-12-30 NOTE — Patient Instructions (Signed)
 SURGICAL WAITING ROOM VISITATION Patients having surgery or a procedure may have no more than 2 support people in the waiting area - these visitors may rotate in the visitor waiting room.   Due to an increase in RSV and influenza rates and associated hospitalizations, children ages 51 and under may not visit patients in [facility] hospitals. If the patient needs to stay at the hospital during part of their recovery, the visitor guidelines for inpatient rooms apply.  PRE-OP VISITATION  Pre-op nurse will coordinate an appropriate time for 1 support person to accompany the patient in pre-op.  This support person may not rotate.  This visitor will be contacted when the time is appropriate for the visitor to come back in the pre-op area.  Please refer to the [facility] website for the visitor guidelines for Inpatients (after your surgery is over and you are in a regular room).  You are not required to quarantine at this time prior to your surgery. However, you must do this: Hand Hygiene often Do NOT share personal items Notify your provider if you are in close contact with someone who has COVID or you develop fever 100.4 or greater, new onset of sneezing, cough, sore throat, shortness of breath or body aches.  If you test positive for Covid or have been in contact with anyone that has tested positive in the last 10 days please notify you surgeon.    Your procedure is scheduled on: 01/09/24   Report to [facility] Main Entrance: [place] entrance where the [employer] is available.   Report to admitting at: 10:55 AM  Call this number if you have any questions or problems the morning of surgery [phone]  FOLLOW ANY ADDITIONAL PRE OP INSTRUCTIONS YOU RECEIVED FROM YOUR SURGEON'S OFFICE!!!  Do not eat food after Midnight the night prior to your surgery/procedure.  After Midnight you may have the following liquids until: 10:00 AM DAY OF SURGERY  Clear Liquid Diet Water  Black  Coffee (sugar ok, NO MILK/CREAM OR CREAMERS)  Tea (sugar ok, NO MILK/CREAM OR CREAMERS) regular and decaf                             Plain Jell-O  with no fruit (NO RED)                                           Fruit ices (not with fruit pulp, NO RED)                                     Popsicles (NO RED)                                                                  Juice: NO CITRUS JUICES: only apple, WHITE grape, WHITE cranberry Sports drinks like Gatorade or Powerade (NO RED)   Oral Hygiene is also important to reduce your risk of infection.        Remember - BRUSH YOUR TEETH THE MORNING OF SURGERY WITH YOUR REGULAR TOOTHPASTE  Do NOT smoke after Midnight the night before surgery.  STOP TAKING all Vitamins, Herbs and supplements 1 week before your surgery.   Take ONLY these medicines the morning of surgery with A SIP OF WATER : metoprolol ,amlodipine ,tamsulosin                   You may not have any metal on your body including hair pins, jewelry, and body piercing  Do not wear lotions, powders, perfumes / cologne, or deodorant  Men may shave face and neck.  Contacts, Hearing Aids, dentures or bridgework may not be worn into surgery. DENTURES WILL BE REMOVED PRIOR TO SURGERY PLEASE DO NOT APPLY Poly grip OR ADHESIVES!!!  You may bring a small overnight bag with you on the day of surgery, only pack items that are not valuable. [facility] IS NOT RESPONSIBLE   FOR VALUABLES THAT ARE LOST OR STOLEN.   Patients discharged on the day of surgery will not be allowed to drive home.  Someone NEEDS to stay with you for the first 24 hours after anesthesia.  Do not bring your home medications to the hospital. The Pharmacy will dispense medications listed on your medication list to you during your admission in the Hospital.  Special Instructions: Bring a copy of your healthcare power of attorney and living will documents the day of surgery, if you wish to have them scanned into your  [facility] Medical Records- EPIC  Please read over the following fact sheets you were given: IF YOU HAVE QUESTIONS ABOUT YOUR PRE-OP INSTRUCTIONS, PLEASE CALL [phone]   [facility] - Preparing for Surgery      Before surgery, you can play an important role.  Because skin is not sterile, your skin needs to be as free of germs as possible.  You can reduce the number of germs on your skin by washing with CHG (chlorahexidine gluconate) soap before surgery.  CHG is an antiseptic cleaner which kills germs and bonds with the skin to continue killing germs even after washing. Please DO NOT use if you have an allergy to CHG or antibacterial soaps.  If your skin becomes reddened/irritated stop using the CHG and inform your nurse when you arrive at Short Stay. Do not shave (including legs and underarms) for at least 48 hours prior to the first CHG shower.  You may shave your face/neck.  Please follow these instructions carefully:  1.  Shower with CHG Soap the night before surgery ONLY (DO NOT USE THE SOAP THE MORNING OF SURGERY).  2.  If you choose to wash your hair, wash your hair first as usual with your normal  shampoo.  3.  After you shampoo, rinse your hair and body thoroughly to remove the shampoo.                             4.  Use CHG as you would any other liquid soap.  You can apply chg directly to the skin and wash.  Gently with a scrungie or clean washcloth.  5.  Apply the CHG Soap to your body ONLY FROM THE NECK DOWN.   Do not use on face/ open                           Wound or open sores. Avoid contact with eyes, ears mouth and genitals (private parts).  Wash face,  Genitals (private parts) with your normal soap.             6.  Wash thoroughly, paying special attention to the area where your  surgery  will be performed.  7.  Thoroughly rinse your body with warm water  from the neck down.  8.  DO NOT shower/wash with your normal soap after using and rinsing off the  CHG Soap.                9.  Pat yourself dry with a clean towel.            10.  Wear clean pajamas.            11.  Place clean sheets on your bed the night of your first shower and do not  sleep with pets.  Day of Surgery : Do not apply any CHG, lotions/deodorants the morning of surgery.  Please wear clean clothes to the hospital/surgery center.   FAILURE TO FOLLOW THESE INSTRUCTIONS MAY RESULT IN THE CANCELLATION OF YOUR SURGERY  PATIENT SIGNATURE_________________________________  NURSE SIGNATURE__________________________________  ________________________________________________________________________

## 2024-01-02 ENCOUNTER — Encounter (HOSPITAL_COMMUNITY): Admission: RE | Admit: 2024-01-02 | Discharge: 2024-01-02 | Attending: Surgery | Admitting: Surgery

## 2024-01-02 ENCOUNTER — Other Ambulatory Visit: Payer: Self-pay

## 2024-01-02 ENCOUNTER — Encounter (HOSPITAL_COMMUNITY): Payer: Self-pay

## 2024-01-02 VITALS — BP 145/95 | HR 73 | Temp 97.9°F | Ht 75.0 in | Wt 277.0 lb

## 2024-01-02 DIAGNOSIS — Z7901 Long term (current) use of anticoagulants: Secondary | ICD-10-CM | POA: Diagnosis not present

## 2024-01-02 DIAGNOSIS — I5030 Unspecified diastolic (congestive) heart failure: Secondary | ICD-10-CM | POA: Diagnosis not present

## 2024-01-02 DIAGNOSIS — Z01812 Encounter for preprocedural laboratory examination: Secondary | ICD-10-CM | POA: Diagnosis present

## 2024-01-02 DIAGNOSIS — I77810 Thoracic aortic ectasia: Secondary | ICD-10-CM | POA: Diagnosis not present

## 2024-01-02 DIAGNOSIS — I4510 Unspecified right bundle-branch block: Secondary | ICD-10-CM | POA: Diagnosis not present

## 2024-01-02 DIAGNOSIS — I480 Paroxysmal atrial fibrillation: Secondary | ICD-10-CM | POA: Diagnosis not present

## 2024-01-02 DIAGNOSIS — H8102 Meniere's disease, left ear: Secondary | ICD-10-CM | POA: Diagnosis not present

## 2024-01-02 DIAGNOSIS — I10 Essential (primary) hypertension: Secondary | ICD-10-CM

## 2024-01-02 DIAGNOSIS — E210 Primary hyperparathyroidism: Secondary | ICD-10-CM | POA: Diagnosis not present

## 2024-01-02 DIAGNOSIS — I130 Hypertensive heart and chronic kidney disease with heart failure and stage 1 through stage 4 chronic kidney disease, or unspecified chronic kidney disease: Secondary | ICD-10-CM | POA: Diagnosis not present

## 2024-01-02 DIAGNOSIS — G4733 Obstructive sleep apnea (adult) (pediatric): Secondary | ICD-10-CM | POA: Diagnosis not present

## 2024-01-02 LAB — BASIC METABOLIC PANEL WITH GFR
Anion gap: 9 (ref 5–15)
BUN: 26 mg/dL — ABNORMAL HIGH (ref 8–23)
CO2: 24 mmol/L (ref 22–32)
Calcium: 11.1 mg/dL — ABNORMAL HIGH (ref 8.9–10.3)
Chloride: 105 mmol/L (ref 98–111)
Creatinine, Ser: 1.46 mg/dL — ABNORMAL HIGH (ref 0.61–1.24)
GFR, Estimated: 49 mL/min — ABNORMAL LOW (ref >60)
Glucose, Bld: 119 mg/dL — ABNORMAL HIGH (ref 70–99)
Potassium: 4.8 mmol/L (ref 3.5–5.1)
Sodium: 138 mmol/L (ref 135–145)

## 2024-01-02 LAB — CBC
HCT: 46.2 % (ref 39.0–52.0)
Hemoglobin: 15.7 g/dL (ref 13.0–17.0)
MCH: 31.2 pg (ref 26.0–34.0)
MCHC: 34 g/dL (ref 30.0–36.0)
MCV: 91.7 fL (ref 80.0–100.0)
Platelets: 198 K/uL (ref 150–400)
RBC: 5.04 MIL/uL (ref 4.22–5.81)
RDW: 12.3 % (ref 11.5–15.5)
WBC: 6.4 K/uL (ref 4.0–10.5)
nRBC: 0 % (ref 0.0–0.2)

## 2024-01-02 NOTE — Progress Notes (Signed)
 For Anesthesia: PCP - Catherine Charlies LABOR, DO  Cardiologist -Croitoru, Mihai, MD . ARNETTA: 12/28/23   Bowel Prep reminder:  Chest x-ray - 10/28/23 EKG - 12/28/23 Stress Test -  ECHO - 07/24/21 Cardiac Cath - 2007 Pacemaker/ICD device last checked: Pacemaker orders received: Device Rep notified:  Spinal Cord Stimulator:N/A  Sleep Study - Yes CPAP - Yes  Fasting Blood Sugar - N/A Checks Blood Sugar _____ times a day Date and result of last Hgb A1c-  Last dose of GLP1 agonist- N/A GLP1 instructions: Hold 7 days prior to schedule (Hold 24 hours-daily)   Last dose of SGLT-2 inhibitors- N/A SGLT-2 instructions: Hold 72 hours prior to surgery  Blood Thinner Instructions: Eliquis  : will bo on hold after: 01/05/24 Last Dose: Time last taken:  Aspirin  Instructions: Last Dose: Time last taken:  Activity level: Can go up a flight of stairs and activities of daily living without stopping and without chest pain and/or shortness of breath   Able to exercise without chest pain and/or shortness of breath     Anesthesia review: Yk:Jumpjo fibrillation ablation,Chronic diastolic heart failure,Hypertension,OSA on CPAP.  Patient denies shortness of breath, fever, cough and chest pain at PAT appointment   Patient verbalized understanding of instructions that were reviewed over the telephone.

## 2024-01-03 NOTE — Progress Notes (Signed)
 Case: [id number] Date/Time: 01/09/24 1255   Procedure: PARATHYROIDECTOMY (Left) - LEFT SUPEROR PARATHYROIDECTOMY   Anesthesia type: General   Pre-op diagnosis: PRIMARY HYPERPARATHYROIDISM   [facility] ROOM 01 / [facility]   Surgeons: Eletha Boas, MD       DISCUSSION: Clayton [name] is a 78 yo male with PMH of HTN, A.fib s/p ablation (2022), on Eliquis , HFpEF, OSA on CPAP, meniere's disease, Gilbert's disease, CKD, arthritis  Patient followed by Cardiology for hx of PAF s/p ablation (2022). He is still on anticoagulation. Most recent echocardiogram from July 2023 showed LVEF 54%, mild LVH, grade 1 diastolic dysfunction, normal right ventricular function, moderate left atrial dilation and mild ascending aorta dilation at 4.5 cm. Nuclear stress test in 2021 showed mildly decreased LVEF at 47% with a large inferior defect described as infarct with peri-infarct ischemia (retrospectively was probably diaphragmatic attenuation). Seen by Dr. Francyne on 12/10 for pre op clearance and cleared for surgery:   Low risk for major cardiac complications with planned parathyroidectomy.  Should stop the Eliquis  3 days before the planned surgery and resume it when his surgeon designates it as safe.  LD Eliquis : 12/18     VS: BP (!) 145/95   Pulse 73   Temp 36.6 C   Ht 6' 3 (1.905 m)   Wt 125.6 kg   SpO2 97%   BMI 34.62 kg/m   PROVIDERS: Kuneff, Renee A, DO   LABS: Labs reviewed: Acceptable for surgery. (all labs ordered are listed, but only abnormal results are displayed)  Labs Reviewed  BASIC METABOLIC PANEL WITH GFR - Abnormal; Notable for the following components:      Result Value   Glucose, Bld 119 (*)    BUN 26 (*)    Creatinine, Ser 1.46 (*)    Calcium  11.1 (*)    GFR, Estimated 49 (*)    All other components within normal limits  CBC     IMAGES:   EKG 12/28/23:  Sinus rhythm first-degree block Left axis deviation Right bundle branch block   Event monitor  07/31/2021:  Patch Wear Time:  11 days and 9 hours    Predominant rhythm was sinus rhythm 20 SVT runs all less than 12 beats Triggered episodes associated with sinus rhythm and short runs of SVT  Echo 07/24/2021:  IMPRESSIONS    1. Left ventricular ejection fraction, by estimation, is 50 to 55%. Left ventricular ejection fraction by 2D MOD biplane is 54.2 %. The left ventricle has low normal function. The left ventricle has no regional wall motion abnormalities. There is mild left ventricular hypertrophy. Left ventricular diastolic parameters are consistent with Grade I diastolic dysfunction (impaired relaxation).  2. Reduced RV free wall strain at -19.4%. Right ventricular systolic function is normal. The right ventricular size is normal. There is normal pulmonary artery systolic pressure. The estimated right ventricular systolic pressure is 16.4 mmHg.  3. Left atrial size was moderately dilated.  4. The mitral valve is abnormal. Trivial mitral valve regurgitation.  5. The aortic valve is tricuspid. Aortic valve regurgitation is not visualized.  6. Aortic dilatation noted. There is mild dilatation of the aortic root, measuring 43 mm. There is mild dilatation of the ascending aorta, measuring 42 mm. There is moderate dilatation of the aortic arch, measuring 45 mm.  7. The inferior vena cava is normal in size with greater than 50% respiratory variability, suggesting right atrial pressure of 3 mmHg.  Comparison(s): Changes from prior study are noted. 02/01/2019: LVEF 50%, moderate  LVH, asc aor 39mm.  Conclusion(s)/Recommendation(s): Recommend contrast CT thoracic aorta to evaluate extent of aneurysm if not already know or obtained.  Cardiac CT 09/08/2020:  IMPRESSION: 1. There is normal pulmonary vein drainage into the left atrium with ostial measurements above.   2. There is no thrombus in the left atrial appendage.   3. The esophagus runs in the left atrial midline and is  not in proximity to any of the pulmonary vein ostia.   4. No PFO/ASD.   5. Normal coronary origin. Left dominance.   6. CAC score of 42.7 Agatston units which is 24th percentile for age-, race-, and sex-matched controls.   Technically difficult study.  NM stress test 07/13/2019:   The left ventricular ejection fraction is mildly decreased (45-54%). Nuclear stress EF: 47%. Blood pressure demonstrated a hypertensive response to exercise. There was no ST segment deviation noted during stress. Defect 1: There is a large defect of severe severity present in the basal inferior, mid inferior and apical inferior location. Findings consistent with prior myocardial infarction with peri-infarct ischemia. This is a low risk study.   Abnormal, low risk stress nuclear study with prior inferior infarct and mild peri-infarct ischemia.  Gated ejection fraction 47% with hypokinesis of the inferior basal wall.  Mild left ventricular enlargement.     Past Medical History:  Diagnosis Date   Aortic atherosclerosis 12/21/2021   Arthritis    hips    Atrial fibrillation (HCC)    Cataract    CHF (congestive heart failure) (HCC)    Chronic diastolic heart failure (HCC) 12/21/2021   Cochlear Meniere syndrome of left ear 06/09/2015   CPAP (continuous positive airway pressure) dependence    Dysrhythmia    PAF- hx of 7 years ago    Gilbert's disease    History of colon cancer 06/08/2023   HLD (hyperlipidemia)    Hypertension 05/29/2010   ECHO-EF 67% wnl   Meniere disease    Obesity    OSA on CPAP 08/20/2012   Palpitations    S/P right total hip arthroplasty 08/12/2020   SCCA (squamous cell carcinoma) of skin 01/21/2022   Sleep apnea 09/14/2004   [facility]- Dr. fernand; CPAP titration 11/12/04- Dr. fernand   Squamous cell carcinoma of skin of neck    right; 2023   Stress 10/06/2021    Past Surgical History:  Procedure Laterality Date   ATRIAL FIBRILLATION ABLATION N/A 09/10/2020    Procedure: ATRIAL FIBRILLATION ABLATION;  Surgeon: Inocencio Soyla Lunger, MD;  Location: [facility] INVASIVE CV LAB;  Service: Cardiovascular;  Laterality: N/A;   CARDIAC CATHETERIZATION  11/03/2005   CARDIOVERSION N/A 09/26/2018   Procedure: CARDIOVERSION;  Surgeon: Barbaraann Darryle Ned, MD;  Location: [facility] ENDOSCOPY;  Service: Endoscopy;  Laterality: N/A;   CARDIOVERSION N/A 03/13/2019   Procedure: CARDIOVERSION;  Surgeon: Raford Riggs, MD;  [facility] ENDOSCOPY;  Service: Cardiovascular;  Laterality: N/A;   CARDIOVERSION N/A 03/03/2021   Procedure: CARDIOVERSION;  Surgeon: Delford Maude BROCKS, MD;  Location: [facility] ENDOSCOPY;  Service: Cardiovascular;  Laterality: N/A;   CATARACT EXTRACTION  01/19/2012   KNEE ARTHROSCOPY Left    left knee- 2002    LAPAROSCOPIC PARTIAL RIGHT COLECTOMY Right 12/21/2021   Procedure: LAPAROSCOPIC RIGHT HEMICOLECTOMY;  Surgeon: Teresa Lonni HERO, MD;  Location[facility];  Service: General;  Laterality: Right;   TOTAL HIP ARTHROPLASTY  06/08/2011   Procedure: TOTAL HIP ARTHROPLASTY ANTERIOR APPROACH;  Surgeon: Donnice JONETTA Car, MD;  Location[facility];  Service: Orthopedics;  Laterality:  Left;   TOTAL HIP ARTHROPLASTY Right 08/12/2020   Procedure: TOTAL HIP ARTHROPLASTY ANTERIOR APPROACH;  Surgeon: Ernie Cough, MD;  [facility];  Service: Orthopedics;  Laterality: Right;    MEDICATIONS:  amLODipine  (NORVASC ) 10 MG tablet   apixaban  (ELIQUIS ) 5 MG TABS tablet   Coenzyme Q10 200 MG capsule   Cyanocobalamin (VITAMIN B12 PO)   ezetimibe  (ZETIA ) 10 MG tablet   metFORMIN  (GLUCOPHAGE ) 500 MG tablet   metoprolol  succinate (TOPROL -XL) 50 MG 24 hr tablet   Multiple Vitamins-Minerals (MULTI-VITAMIN/MINERALS PO)   olmesartan  (BENICAR ) 20 MG tablet   Pitavastatin  Calcium  2 MG TABS   spironolactone  (ALDACTONE ) 25 MG tablet   tamsulosin  (FLOMAX ) 0.4 MG CAPS capsule   triamcinolone  cream (KENALOG ) 0.5 %   Zinc Sulfate (ZINC 15 PO)   No current facility-administered  medications for this encounter.   Burnard CHRISTELLA Odis DEVONNA [facility] Short Stay/Anesthesiology [facility] Phone [phone] 01/04/2024 9:28 AM

## 2024-01-04 ENCOUNTER — Encounter (HOSPITAL_COMMUNITY): Payer: Self-pay

## 2024-01-04 NOTE — Anesthesia Preprocedure Evaluation (Signed)
 Anesthesia Evaluation    Airway        Dental   Pulmonary           Cardiovascular hypertension,      Neuro/Psych    GI/Hepatic   Endo/Other    Renal/GU      Musculoskeletal   Abdominal   Peds  Hematology   Anesthesia Other Findings   Reproductive/Obstetrics                              Anesthesia Physical Anesthesia Plan  ASA:   Anesthesia Plan:    Post-op Pain Management:    Induction:   PONV Risk Score and Plan:   Airway Management Planned:   Additional Equipment:   Intra-op Plan:   Post-operative Plan:   Informed Consent:   Plan Discussed with:   Anesthesia Plan Comments: (See PAT note from 12/15 )         Anesthesia Quick Evaluation

## 2024-01-07 ENCOUNTER — Encounter (HOSPITAL_COMMUNITY): Payer: Self-pay | Admitting: Surgery

## 2024-01-07 NOTE — H&P (Signed)
 "    REFERRING PHYSICIAN: Claudene Arthea HERO, DO  PROVIDER: Zenia Guest OZELL SPINNER, MD   Chief Complaint: New problem (Thyroid  nodule/ elevated PTH level)  History of Present Illness:  Patient is referred by Dr. Arthea Claudene for surgical evaluation and management of newly diagnosed primary hyperparathyroidism. Patient was noted on routine laboratory studies to have an elevated serum calcium  level in the spring 2025. Calcium  measured 10.9. Subsequent intact PTH level was elevated at 129. 25-hydroxy vitamin D  level was normal at 32. Patient was referred for an ultrasound examination in May 2025 which demonstrated a normal-sized thyroid  gland. There was a benign cyst in the right lobe and a small spongiform nodule in the right lobe. Otherwise there were no significant abnormalities. An enlarged parathyroid  gland was not identified. Further imaging has not been performed. Patient is symptomatic. He notes fatigue. He notes bone and joint discomfort. He denies any history of nephrolithiasis. He has had some nausea and abdominal complaints. He notes urinary frequency which is increased over the past year. Patient has had no prior head or neck surgery. There is no family history of parathyroid  disease. He is accompanied today by his wife.  Of note, the patient does have a significant past cardiac history. He has a history of atrial fibrillation. He has undergone cardioversions and an ablation. He is currently on anticoagulation with Eliquis .  Review of Systems: A complete review of systems was obtained from the patient. I have reviewed this information and discussed as appropriate with the patient. See HPI as well for other ROS.  Review of Systems  Constitutional: Positive for malaise/fatigue. Negative for weight loss.  HENT: Negative.  Eyes: Negative.  Respiratory: Negative.  Cardiovascular: Positive for palpitations.  Gastrointestinal: Positive for nausea.  Genitourinary: Positive for frequency.   Musculoskeletal: Positive for joint pain.  Skin: Negative.  Neurological: Positive for tremors.  Endo/Heme/Allergies: Negative.  Psychiatric/Behavioral: Negative.    Medical History: Past Medical History:  Diagnosis Date  Arrhythmia  Arthritis  Hypertension  Sleep apnea   Patient Active Problem List  Diagnosis  Hyperparathyroidism (HHS-HCC)  Multiple thyroid  nodules   Past Surgical History:  Procedure Laterality Date  JOINT REPLACEMENT    No Known Allergies  Current Outpatient Medications on File Prior to Visit  Medication Sig Dispense Refill  AMIOdarone  (PACERONE ) 200 MG tablet Take 200 mg by mouth once daily  amLODIPine  (NORVASC ) 5 MG tablet TAKE 1.5 TABLETS BY MOUTH EVERY DAY  co-enzyme Q-10, ubiquinone, 200 mg capsule Take 200 mg by mouth at bedtime  cyanocobalamin (VITAMIN B12) 100 MCG tablet Take 500 mcg by mouth daily. Patient takes a gummie  ELIQUIS  5 mg tablet Take 1 tablet by mouth 2 (two) times daily  ezetimibe  (ZETIA ) 10 mg tablet TAKE 1 TABLET (10 MG TOTAL) BY MOUTH IN THE MORNING  LIVALO  2 mg Tab Take 1 tablet by mouth every evening  metFORMIN  (GLUCOPHAGE ) 500 MG tablet Take 1 tablet by mouth daily with breakfast  metoprolol  SUCCinate (TOPROL -XL) 50 MG XL tablet TAKE 100 MG (2 TABLETS) BY MOUTH IN THE MORNING, AND 75 MG (1.5 TABLETS) IN THE EVENING.  multivitamin with minerals tablet Take 1 tablet by mouth once daily  olmesartan  (BENICAR ) 40 MG tablet Take 1 tablet by mouth once daily  spironolactone  (ALDACTONE ) 25 MG tablet Take 0.5 tablets by mouth once daily  tamsulosin  (FLOMAX ) 0.4 mg capsule Take 1 capsule by mouth once daily  triamcinolone  0.5 % cream APPLY 1 APPLICATION TOPICALLY 2 (TWO) TIMES DAILY. TO AFFECTED AREAS.  metoprolol  succinate (TOPROL -XL) 100 MG XL tablet Take 1 tablet by mouth once daily   No current facility-administered medications on file prior to visit.   History reviewed. No pertinent family history.   Social History    Tobacco Use  Smoking Status Never  Smokeless Tobacco Never    Social History   Socioeconomic History  Marital status: Married  Tobacco Use  Smoking status: Never  Smokeless tobacco: Never  Vaping Use  Vaping status: Never Used  Substance and Sexual Activity  Alcohol use: Not Currently  Drug use: Never  Sexual activity: Defer   Social Drivers of Health   Financial Resource Strain: Low Risk (04/14/2022)  Received from [facility]  Overall Financial Resource Strain (CARDIA)  Difficulty of Paying Living Expenses: Not hard at all  Food Insecurity: Low Risk (04/28/2023)  Received from [facility]  Hunger Vital Sign  Within the past 12 months, you worried that your food would run out before you got money to buy more: Never true  Within the past 12 months, the food you bought just didn't last and you didn't have money to get more. : Never true  Transportation Needs: No Transportation Needs (04/28/2023)  Received from [employer] [facility]  In the past 12 months, has lack of reliable transportation kept you from medical appointments, meetings, work or from getting things needed for daily living? : No  Physical Activity: Sufficiently Active (04/14/2022)  Received from [facility]  Exercise Vital Sign  On average, how many days per week do you engage in moderate to strenuous exercise (like a brisk walk)?: 7 days  On average, how many minutes do you engage in exercise at this level?: 50 min  Stress: No Stress Concern Present (04/14/2022)  Received from [facility] - Occupational Stress Questionnaire  Feeling of Stress : Not at all  Social Connections: Moderately Isolated (04/14/2022)  Received from [facility]  Social Connection and Isolation Panel  In a typical week, how many times do you talk on the phone with family, friends, or neighbors?: More than three times a week  How often do you get together with friends or relatives?:  More than three times a week  How often do you attend church or religious services?: Never  Do you belong to any clubs or organizations such as church groups, unions, fraternal or athletic groups, or school groups?: No  How often do you attend meetings of the clubs or organizations you belong to?: Never  Are you married, widowed, divorced, separated, never married, or living with a partner?: Married  Housing Stability: Unknown (11/29/2023)  Housing Stability Vital Sign  Homeless in the Last Year: No   Objective:   Vitals:  BP: (!) 163/93  Pulse: 95  Temp: 36.8 C (98.2 F)  TempSrc: Temporal  SpO2: 96%  Weight: (!) 125.1 kg (275 lb 12.8 oz)  Height: 190.5 cm (6' 3)  PainSc: 0-No pain   Body mass index is 34.47 kg/m.  Physical Exam   GENERAL APPEARANCE Comfortable, no acute issues Development: normal Gross deformities: none  SKIN Rash, lesions, ulcers: none Induration, erythema: none Nodules: none palpable  EYES Conjunctiva and lids: normal Pupils: equal  EARS, NOSE, MOUTH, THROAT External ears: no lesion or deformity External nose: no lesion or deformity Hearing: grossly normal  NECK Symmetric: yes Trachea: midline Thyroid : no palpable nodules in the thyroid  bed  CHEST/CV Not assessed  ABDOMEN Not assessed  GENITOURINARY/RECTAL Not assessed  MUSCULOSKELETAL Station  and gait: normal Digits and nails: no clubbing or cyanosis Muscle strength: grossly normal all extremities Deformity: mild bilateral tremor  LYMPHATIC Cervical: none palpable Supraclavicular: none palpable  PSYCHIATRIC Oriented to person, place, and time: yes Mood and affect: normal for situation Judgment and insight: appropriate for situation   Assessment and Plan:   Hyperparathyroidism (HHS-HCC) Multiple thyroid  nodules  Patient is referred by Dr. Arthea Sharps for surgical evaluation and management of newly diagnosed primary hyperparathyroidism.  Patient provided with a  copy of Parathyroid  Surgery: Treatment for Your Parathyroid  Gland Problem, published by [employer], 12 pages. Book reviewed and explained to patient during visit today.  Today we reviewed his clinical history. We reviewed his recent laboratory studies. We reviewed the results of his ultrasound examination. The bilateral thyroid  nodules are of no consequence and require no further evaluation. Based on his laboratory studies however, he likely does have primary hyperparathyroidism. Ultrasound did not demonstrate an enlarged parathyroid  gland. I would like to proceed with a nuclear medicine parathyroid  scan with sestamibi in hopes of confirming the diagnosis and localizing the parathyroid  adenoma. We will also obtain a 24-hour urine collection for calcium  to see if it is consistent with the diagnosis and to rule out FHH. If the sestamibi scan fails to identify a parathyroid  adenoma, then we will consider proceeding with a 4D CT scan of the neck with parathyroid  protocol.  Today we discussed minimally invasive parathyroidectomy. We discussed the size and location of the surgical incision. We discussed doing this as an outpatient surgical procedure under general anesthesia. We discussed potential complications including the potential for recurrent laryngeal nerve injury. We discussed the postoperative recovery. The patient understands.  Patient will undergo the above testing. I will be in touch with his results when they are available and we will make plans for further management at that time.  Krystal Spinner, MD [facility] practice Office: [phone]   "

## 2024-01-09 ENCOUNTER — Encounter (HOSPITAL_COMMUNITY): Payer: Self-pay | Admitting: Surgery

## 2024-01-09 ENCOUNTER — Encounter (HOSPITAL_COMMUNITY): Payer: Self-pay | Admitting: Medical

## 2024-01-09 ENCOUNTER — Ambulatory Visit (HOSPITAL_COMMUNITY)

## 2024-01-09 ENCOUNTER — Ambulatory Visit (HOSPITAL_COMMUNITY)
Admission: RE | Admit: 2024-01-09 | Discharge: 2024-01-09 | Disposition: A | Discharge status: Home / Self Care | Source: Ambulatory Visit | Attending: Surgery | Admitting: Surgery

## 2024-01-09 ENCOUNTER — Encounter: Admission: RE | Disposition: A | Payer: Self-pay | Attending: Surgery

## 2024-01-09 ENCOUNTER — Encounter: Payer: Self-pay | Admitting: Family Medicine

## 2024-01-09 ENCOUNTER — Other Ambulatory Visit: Payer: Self-pay

## 2024-01-09 DIAGNOSIS — E210 Primary hyperparathyroidism: Secondary | ICD-10-CM | POA: Insufficient documentation

## 2024-01-09 DIAGNOSIS — N1831 Chronic kidney disease, stage 3a: Secondary | ICD-10-CM

## 2024-01-09 DIAGNOSIS — D351 Benign neoplasm of parathyroid gland: Secondary | ICD-10-CM | POA: Diagnosis not present

## 2024-01-09 DIAGNOSIS — Z7901 Long term (current) use of anticoagulants: Secondary | ICD-10-CM | POA: Insufficient documentation

## 2024-01-09 DIAGNOSIS — I10 Essential (primary) hypertension: Secondary | ICD-10-CM | POA: Insufficient documentation

## 2024-01-09 DIAGNOSIS — I4891 Unspecified atrial fibrillation: Secondary | ICD-10-CM | POA: Insufficient documentation

## 2024-01-09 DIAGNOSIS — M1990 Unspecified osteoarthritis, unspecified site: Secondary | ICD-10-CM | POA: Insufficient documentation

## 2024-01-09 DIAGNOSIS — G4730 Sleep apnea, unspecified: Secondary | ICD-10-CM | POA: Diagnosis not present

## 2024-01-09 DIAGNOSIS — E042 Nontoxic multinodular goiter: Secondary | ICD-10-CM | POA: Diagnosis present

## 2024-01-09 DIAGNOSIS — I4819 Other persistent atrial fibrillation: Secondary | ICD-10-CM

## 2024-01-09 DIAGNOSIS — I129 Hypertensive chronic kidney disease with stage 1 through stage 4 chronic kidney disease, or unspecified chronic kidney disease: Secondary | ICD-10-CM

## 2024-01-09 HISTORY — PX: PARATHYROIDECTOMY: SHX19

## 2024-01-09 LAB — GLUCOSE, CAPILLARY: Glucose-Capillary: 131 mg/dL — ABNORMAL HIGH (ref 70–99)

## 2024-01-09 SURGERY — PARATHYROIDECTOMY
Anesthesia: General | Site: Neck | Laterality: Left

## 2024-01-09 MED ORDER — SUGAMMADEX SODIUM 200 MG/2ML IV SOLN
INTRAVENOUS | Status: AC
  Filled 2024-01-09: qty 2

## 2024-01-09 MED ORDER — CHLORHEXIDINE GLUCONATE CLOTH 2 % EX PADS: 6.0000 | MEDICATED_PAD | Freq: Once | CUTANEOUS | Status: DC

## 2024-01-09 MED ORDER — SUGAMMADEX SODIUM 200 MG/2ML IV SOLN
INTRAVENOUS | Status: DC | PRN
  Administered 2024-01-09: 200 mg via INTRAVENOUS

## 2024-01-09 MED ORDER — 0.9 % SODIUM CHLORIDE (POUR BTL) OPTIME
TOPICAL | Status: DC | PRN
  Administered 2024-01-09: 1000 mL

## 2024-01-09 MED ORDER — LACTATED RINGERS IV SOLN: INTRAVENOUS | Status: DC | PRN

## 2024-01-09 MED ORDER — HEMOSTATIC AGENTS (NO CHARGE) OPTIME
TOPICAL | Status: DC | PRN
  Administered 2024-01-09: 1 via TOPICAL

## 2024-01-09 MED ORDER — INSULIN ASPART 100 UNIT/ML IJ SOLN: 0.0000 [IU] | INTRAMUSCULAR | Status: DC | PRN

## 2024-01-09 MED ORDER — FENTANYL CITRATE (PF) 100 MCG/2ML IJ SOLN
INTRAMUSCULAR | Status: AC
  Filled 2024-01-09: qty 2

## 2024-01-09 MED ORDER — BUPIVACAINE HCL (PF) 0.25 % IJ SOLN
INTRAMUSCULAR | Status: AC
  Filled 2024-01-09: qty 30

## 2024-01-09 MED ORDER — LACTATED RINGERS IV SOLN: INTRAVENOUS | Status: DC

## 2024-01-09 MED ORDER — ACETAMINOPHEN 10 MG/ML IV SOLN: 1000.0000 mg | Freq: Once | INTRAVENOUS | Status: DC | PRN

## 2024-01-09 MED ORDER — CHLORHEXIDINE GLUCONATE 0.12 % MT SOLN
15.0000 mL | Freq: Once | OROMUCOSAL | Status: AC
  Administered 2024-01-09: 15 mL via OROMUCOSAL

## 2024-01-09 MED ORDER — OXYCODONE HCL 5 MG/5ML PO SOLN: 5.0000 mg | Freq: Once | ORAL | Status: DC | PRN

## 2024-01-09 MED ORDER — AMISULPRIDE (ANTIEMETIC) 5 MG/2ML IV SOLN: 10.0000 mg | Freq: Once | INTRAVENOUS | Status: DC | PRN

## 2024-01-09 MED ORDER — OXYCODONE HCL 5 MG PO TABS: 5.0000 mg | ORAL_TABLET | Freq: Once | ORAL | Status: DC | PRN

## 2024-01-09 MED ORDER — ONDANSETRON HCL 4 MG/2ML IJ SOLN
INTRAMUSCULAR | Status: DC | PRN
  Administered 2024-01-09: 4 mg via INTRAVENOUS

## 2024-01-09 MED ORDER — FENTANYL CITRATE (PF) 50 MCG/ML IJ SOSY: 25.0000 ug | PREFILLED_SYRINGE | INTRAMUSCULAR | Status: DC | PRN

## 2024-01-09 MED ORDER — ONDANSETRON HCL 4 MG/2ML IJ SOLN: 4.0000 mg | Freq: Once | INTRAMUSCULAR | Status: DC | PRN

## 2024-01-09 MED ORDER — PHENYLEPHRINE 80 MCG/ML (10ML) SYRINGE FOR IV PUSH (FOR BLOOD PRESSURE SUPPORT)
PREFILLED_SYRINGE | INTRAVENOUS | Status: DC | PRN
  Administered 2024-01-09 (×2): 80 ug via INTRAVENOUS

## 2024-01-09 MED ORDER — PHENYLEPHRINE HCL-NACL 20-0.9 MG/250ML-% IV SOLN
INTRAVENOUS | Status: DC | PRN
  Administered 2024-01-09: 20 ug/min via INTRAVENOUS

## 2024-01-09 MED ORDER — CEFAZOLIN SODIUM-DEXTROSE 3-4 GM/150ML-% IV SOLN
3.0000 g | INTRAVENOUS | Status: AC
  Administered 2024-01-09: 3 g via INTRAVENOUS
  Filled 2024-01-09: qty 150

## 2024-01-09 MED ORDER — BUPIVACAINE HCL 0.25 % IJ SOLN
INTRAMUSCULAR | Status: DC | PRN
  Administered 2024-01-09: 10 mL

## 2024-01-09 MED ORDER — ORAL CARE MOUTH RINSE: 15.0000 mL | Freq: Once | OROMUCOSAL | Status: AC

## 2024-01-09 MED ORDER — TRAMADOL HCL 50 MG PO TABS: 50.0000 mg | ORAL_TABLET | Freq: Four times a day (QID) | ORAL | 0 refills | Status: AC | PRN

## 2024-01-09 MED ORDER — DEXAMETHASONE SOD PHOSPHATE PF 10 MG/ML IJ SOLN
INTRAMUSCULAR | Status: DC | PRN
  Administered 2024-01-09: 5 mg via INTRAVENOUS

## 2024-01-09 MED ADMIN — PROPOFOL 200 MG/20ML IV EMUL: 100 mg | INTRAVENOUS | NDC 00069020910

## 2024-01-09 MED ADMIN — Fentanyl Citrate Preservative Free (PF) Inj 100 MCG/2ML: 50 ug | INTRAVENOUS | NDC 72572017025

## 2024-01-09 MED ADMIN — Rocuronium Bromide IV Soln 100 MG/10ML (10 MG/ML): 70 mg | INTRAVENOUS | NDC 00143925110

## 2024-01-09 MED ADMIN — Lidocaine HCl(Cardiac) IV PF Soln Pref Syr 100 MG/5ML (2%): 100 mg | INTRAVENOUS | NDC 00409132305

## 2024-01-09 SURGICAL SUPPLY — 29 items
BAG COUNTER SPONGE SURGICOUNT (BAG) ×1 IMPLANT
BLADE SURG 15 STRL LF DISP TIS (BLADE) ×1 IMPLANT
CHLORAPREP W/TINT 26 (MISCELLANEOUS) ×1 IMPLANT
CLIP TI MEDIUM 6 (CLIP) ×2 IMPLANT
CLIP TI WIDE RED SMALL 6 (CLIP) ×2 IMPLANT
COVER SURGICAL LIGHT HANDLE (MISCELLANEOUS) ×1 IMPLANT
DERMABOND ADVANCED .7 DNX12 (GAUZE/BANDAGES/DRESSINGS) ×1 IMPLANT
DRAPE LAPAROTOMY T 98X78 PEDS (DRAPES) ×1 IMPLANT
DRAPE UTILITY XL STRL (DRAPES) ×1 IMPLANT
ELECT PENCIL ROCKER SW 15FT (MISCELLANEOUS) ×1 IMPLANT
ELECT REM PT RETURN 15FT ADLT (MISCELLANEOUS) ×1 IMPLANT
GAUZE 4X4 16PLY ~~LOC~~+RFID DBL (SPONGE) ×1 IMPLANT
GLOVE SURG ORTHO 8.0 STRL STRW (GLOVE) ×1 IMPLANT
GOWN STRL REUS W/ TWL XL LVL3 (GOWN DISPOSABLE) ×3 IMPLANT
HEMOSTAT SURGICEL 2X4 FIBR (HEMOSTASIS) ×1 IMPLANT
ILLUMINATOR WAVEGUIDE N/F (MISCELLANEOUS) IMPLANT
KIT BASIN OR (CUSTOM PROCEDURE TRAY) ×1 IMPLANT
KIT TURNOVER KIT A (KITS) ×1 IMPLANT
NDL HYPO 22X1.5 SAFETY MO (MISCELLANEOUS) ×1 IMPLANT
NEEDLE HYPO 22X1.5 SAFETY MO (MISCELLANEOUS) ×1 IMPLANT
PACK BASIC VI WITH GOWN DISP (CUSTOM PROCEDURE TRAY) ×1 IMPLANT
PAD MAGNETIC INSTR ST 16X20 (MISCELLANEOUS) ×1 IMPLANT
SHEARS HARMONIC 9CM CVD (BLADE) ×1 IMPLANT
SUT MNCRL AB 4-0 PS2 18 (SUTURE) ×1 IMPLANT
SUT VIC AB 3-0 SH 18 (SUTURE) ×1 IMPLANT
SYR BULB IRRIG 60ML STRL (SYRINGE) ×1 IMPLANT
SYR CONTROL 10ML LL (SYRINGE) ×1 IMPLANT
TOWEL OR DSP ST BLU DLX 10/PK (DISPOSABLE) ×1 IMPLANT
TUBING CONNECTING 10 (TUBING) ×1 IMPLANT

## 2024-01-09 NOTE — Transfer of Care (Signed)
 Immediate Anesthesia Transfer of Care Note  Patient: Clayton [name]  Procedure(s) Performed: PARATHYROIDECTOMY (Left: Neck)  Patient Location: PACU  Anesthesia Type:General  Level of Consciousness: awake and alert   Airway & Oxygen  Therapy: Patient Spontanous Breathing and Patient connected to nasal cannula oxygen   Post-op Assessment: Report given to RN and Post -op Vital signs reviewed and stable  Post vital signs: Reviewed and stable  Last Vitals:  Vitals Value Taken Time  BP 137/83 01/09/24 14:15  Temp    Pulse 146 01/09/24 14:15  Resp 16 01/09/24 14:15  SpO2 91 % 01/09/24 14:15  Vitals shown include unfiled device data.  Last Pain:  Vitals:   01/09/24 1055  TempSrc: Oral         Complications: No notable events documented.

## 2024-01-09 NOTE — Anesthesia Postprocedure Evaluation (Signed)
"   Anesthesia Post Note  Patient: Clayton [name]  Procedure(s) Performed: PARATHYROIDECTOMY (Left: Neck)     Patient location during evaluation: PACU Anesthesia Type: General Level of consciousness: awake Pain management: pain level controlled Vital Signs Assessment: post-procedure vital signs reviewed and stable Respiratory status: spontaneous breathing Cardiovascular status: blood pressure returned to baseline Postop Assessment: no apparent nausea or vomiting Anesthetic complications: no   No notable events documented.  Last Vitals:  Vitals:   01/09/24 1445 01/09/24 1500  BP: 137/86 (!) 146/90  Pulse: 65 70  Resp: 19 13  Temp:    SpO2: 96% 97%    Last Pain:  Vitals:   01/09/24 1500  TempSrc:   PainSc: 0-No pain                 [name]      "

## 2024-01-09 NOTE — Interval H&P Note (Signed)
 History and Physical Interval Note:  01/09/2024 12:16 PM  Clayton [name]  has presented today for surgery, with the diagnosis of PRIMARY HYPERPARATHYROIDISM.  The various methods of treatment have been discussed with the patient and family. After consideration of risks, benefits and other options for treatment, the patient has consented to    Procedures with comments: PARATHYROIDECTOMY (Left) - LEFT SUPEROR PARATHYROIDECTOMY as a surgical intervention.    The patient's history has been reviewed, patient examined, no change in status, stable for surgery.  I have reviewed the patient's chart and labs.  Questions were answered to the patient's satisfaction.    Krystal Spinner, MD [facility] practice Office: [phone]   Krystal Spinner

## 2024-01-09 NOTE — Op Note (Signed)
 OPERATIVE REPORT - PARATHYROIDECTOMY  Preoperative diagnosis: Primary hyperparathyroidism  Postop diagnosis: Same  Procedure: Left superior minimally invasive parathyroidectomy  Surgeon:  Krystal Spinner, MD  Anesthesia: General endotracheal  Estimated blood loss: Minimal  Preparation: ChloraPrep  Indications: Patient is referred by Dr. Arthea Sharps for surgical evaluation and management of newly diagnosed primary hyperparathyroidism. Patient was noted on routine laboratory studies to have an elevated serum calcium  level in the spring 2025. Calcium  measured 10.9. Subsequent intact PTH level was elevated at 129. 25-hydroxy vitamin D  level was normal at 32. Patient was referred for an ultrasound examination in May 2025 which demonstrated a normal-sized thyroid  gland. There was a benign cyst in the right lobe and a small spongiform nodule in the right lobe. Otherwise there were no significant abnormalities. An enlarged parathyroid  gland was not identified. Further imaging has not been performed. Patient is symptomatic. He notes fatigue. He notes bone and joint discomfort. He denies any history of nephrolithiasis. He has had some nausea and abdominal complaints. He notes urinary frequency which is increased over the past year. Patient has had no prior head or neck surgery. There is no family history of parathyroid  disease. He is accompanied today by his wife.   Procedure: The patient was prepared in the pre-operative holding area. The patient was brought to the operating room and placed in a supine position on the operating room table. Following administration of general anesthesia, the patient was positioned and then prepped and draped in the usual strict aseptic fashion. After ascertaining that an adequate level of anesthesia been achieved, a neck incision was made with a #15 blade. Dissection was carried through subcutaneous tissues and platysma. Hemostasis was obtained with the electrocautery. Skin  flaps were developed circumferentially and a Weitlander retractor was placed for exposure.  Strap muscles were incised in the midline. Strap muscles were reflected laterally exposing the left thyroid  lobe. With gentle blunt dissection the thyroid  lobe was mobilized.  Dissection was carried posteriorly and an enlarged parathyroid  gland was identified. It was gently mobilized. Vascular structures were divided between small ligaclips. Care was taken to avoid the recurrent laryngeal nerve. The parathyroid  gland was completely excised. It was submitted to pathology where frozen section confirmed hypercellular parathyroid  tissue consistent with adenoma.  Neck was irrigated with warm saline and good hemostasis was noted. Fibrillar was placed in the operative field. Strap muscles were approximated in the midline with interrupted 3-0 Vicryl sutures. Platysma was closed with interrupted 3-0 Vicryl sutures. Marcaine  was infiltrated circumferentially. Skin was closed with a running 4-0 Monocryl subcuticular suture. Wound was washed and dried and Dermabond was applied. Patient was awakened from anesthesia and brought to the recovery room. The patient tolerated the procedure well.   Krystal Spinner, MD [facility] Surgery Office: [phone]

## 2024-01-09 NOTE — Progress Notes (Signed)
 Called patient and left message to restart Eliquis  tomorrow.

## 2024-01-09 NOTE — Anesthesia Procedure Notes (Signed)
 Procedure Name: Intubation Date/Time: 01/09/2024 1:02 PM  Performed by: Dartha Meckel, CRNAPre-anesthesia Checklist: Patient identified, Emergency Drugs available, Suction available and Patient being monitored Patient Re-evaluated:Patient Re-evaluated prior to induction Oxygen  Delivery Method: Circle system utilized Preoxygenation: Pre-oxygenation with 100% oxygen  Induction Type: IV induction Ventilation: Mask ventilation without difficulty Laryngoscope Size: Mac and 4 Grade View: Grade II Tube type: Oral Tube size: 7.5 mm Number of attempts: 1 Airway Equipment and Method: Stylet and Oral airway Placement Confirmation: ETT inserted through vocal cords under direct vision, positive ETCO2 and breath sounds checked- equal and bilateral Secured at: 22 cm Tube secured with: Tape Dental Injury: Teeth and Oropharynx as per pre-operative assessment

## 2024-01-09 NOTE — Discharge Instructions (Addendum)

## 2024-01-10 ENCOUNTER — Ambulatory Visit: Payer: Self-pay | Admitting: Surgery

## 2024-01-10 ENCOUNTER — Ambulatory Visit: Admitting: Psychology

## 2024-01-10 ENCOUNTER — Encounter (HOSPITAL_COMMUNITY): Payer: Self-pay | Admitting: Surgery

## 2024-01-10 LAB — SURGICAL PATHOLOGY

## 2024-01-10 NOTE — Progress Notes (Signed)
 Pathology as expected - benign parathyroid  adenoma.  Krystal Spinner, MD [facility] practice Office: [phone]

## 2024-01-31 ENCOUNTER — Encounter: Payer: Self-pay | Admitting: Family Medicine

## 2024-01-31 ENCOUNTER — Ambulatory Visit: Admitting: Psychology

## 2024-01-31 ENCOUNTER — Ambulatory Visit: Admitting: Family Medicine

## 2024-01-31 DIAGNOSIS — F411 Generalized anxiety disorder: Secondary | ICD-10-CM

## 2024-01-31 DIAGNOSIS — E210 Primary hyperparathyroidism: Secondary | ICD-10-CM

## 2024-01-31 DIAGNOSIS — Z9889 Other specified postprocedural states: Secondary | ICD-10-CM | POA: Diagnosis not present

## 2024-01-31 DIAGNOSIS — Z9089 Acquired absence of other organs: Secondary | ICD-10-CM | POA: Diagnosis not present

## 2024-01-31 NOTE — Patient Instructions (Addendum)

## 2024-01-31 NOTE — Progress Notes (Signed)
 [facility] Counselor/Therapist Progress Note  Patient ID: Clayton [name], MRN: [id number]    Date: 01/31/2024  Time Spent: 2:56 pm - 3:45 pm : 49 Minutes  Treatment Type: Individual Therapy.  Reported Symptoms: Anxiety  Mental Status Exam: Appearance:  Casual     Behavior: Appropriate  Motor: Normal  Speech/Language:  Clear and Coherent  Affect: Congruent  Mood: normal  Thought process: normal  Thought content:   WNL  Sensory/Perceptual disturbances:   WNL  Orientation: oriented to person, place, time/date, and situation  Attention: Good  Concentration: Good  Memory: WNL  Fund of knowledge:  Good  Insight:   Good  Judgment:  Good  Impulse Control: Good   Risk Assessment: Danger to Self:  No Self-injurious Behavior: No Danger to Others: No Duty to Warn:no Physical Aggression / Violence:No  Access to Firearms a concern: No  Gang Involvement:No   Subjective:   Clayton [name] Constant participated in the session, in person in the office with the therapist, and consented to treatment Clayton [name] reviewed the events of the past week. Clayton [name] noted a recent surgery due to an issue with his para-thyroid . He noted the events leading up to his surgery.  He noted feeling significantly better after this procedure and noted an increase in energy. He noted employing tools in therapy leading up to the surgery to aid in managing the stress related to having surgery. He noted his consideration to reconnect with his weight-loss provider to jumpstart his weight-loss. He noted his intent to engage in his previously prescribed diet prior to this appointment. He noted some improvement in his sleep but endorsed continued stress upon waking and noted continued GI discomfort upon waking. He noted his continued effort to manage this symptoms and tackle this issue. We worked on processing the possibility that there might not be an explanation for his symptoms upon waking. We began exploring this  during the session. We continued to work on identifying possible contributing factors that could affect his sleep. Clayton [name] noted a typical night-time schedule. We could not identify any possible contributing behavioral factors that could affect his sleep. Therapist encouraged continued introspection in this area. Therapist praised Clayton [name] for his effort during the session. Clayton [name] was engaged and motivated during the session. He expressed commitment towards treatment goals. Therapist praised Clayton [name] for his effort during the session and provided supportive therapy. A follow-up was scheduled for continued treatment, which he benefits from.   Interventions: CBT  Diagnosis:   Generalized anxiety disorder  Psychiatric Treatment: No , NA  Treatment Plan:  Client Abilities/Strengths Clayton [name] is intelligent, self-aware, and motivated for change.   Support System: Family and friends.   Client Treatment Preferences OPT  Client Statement of Needs Clayton [name] would like to improve his health, continued building a mindfulness practice, managing his mood proactively and consistently, process past events, and bolster coping skills. He noted an interest a need to process his propensity to get things done.  Additionally, he noted a need to explore having to pay people for services that he previously would do himself. Additionally, he noted a need to be be mindful of his diligence in various areas and noted a need to be more mindful of this and choosing when to pursue this or delay it for a later date.   Treatment Level Weekly  Symptoms  Anxiety: Feeling anxious, difficulty managing worry, and worrying about different things.   (Status: maintained)  Goals:   Clayton experiences symptoms of anxiety.   Treatment plan  signed and available on s-drive:  No, pending signature via MyChart.  Clayton [name] was sent the treatment plan signature form on 09/05/23.   Target Date: 09/04/24 Frequency: Weekly  Progress:  0 Modality: individual    Therapist will provide referrals for additional resources as appropriate.  Therapist will provide psycho-education regarding Clayton [name]'s diagnosis and corresponding treatment approaches and interventions. Clayton Mullet, LCSW will support the patient's ability to achieve the goals identified. will employ CBT, BA, Problem-solving, Solution Focused, Mindfulness,  coping skills, & other evidenced-based practices will be used to promote progress towards healthy functioning to help manage decrease symptoms associated with his diagnosis.   Reduce overall level, frequency, and intensity of the feelings of anxiety evidenced by decreased overall symptoms from 6 to 7 days/week to 0 to 1 days/week per client report for at least 3 consecutive months. Verbally express understanding of the relationship between feelings of anxiety and their impact on thinking patterns and behaviors. Verbalize an understanding of the role that distorted thinking plays in creating fears, excessive worry, and ruminations.  Clayton [name] participated in the creation of the treatment plan)    Clayton Mullet, LCSW

## 2024-01-31 NOTE — Progress Notes (Unsigned)
 "     Clayton [name] , [birth year], 79 y.o., male MRN: [id number] Patient Care Team    Relationship Specialty Notifications Start End  Catherine Charlies LABOR, DO PCP - General Family Medicine  06/09/15   Inocencio Soyla Lunger, MD PCP - Electrophysiology Cardiology  12/09/23   Francyne Headland, MD PCP - Cardiology Cardiology  12/09/23   Burnard Debby LABOR, MD (Inactive) Consulting Physician Cardiology  06/09/15   Octavia Bruckner, MD Consulting Physician Ophthalmology  06/09/15   Robinson Idol, MD Consulting Physician Ophthalmology  06/09/15   Court Pulling, MD Consulting Physician Dermatology  06/09/15   Thaddeus Locus, MD Consulting Physician Otolaryngology  06/09/15   Ernie Cough, MD Consulting Physician Orthopedic Surgery  06/09/15   Cam Morene ORN, MD Attending Physician Urology  07/27/21   Legrand Victory LITTIE DOUGLAS, MD Consulting Physician Gastroenterology  05/20/23   Inocencio Soyla Lunger, MD Consulting Physician Cardiology  05/20/23   Micah Ludwig, LCSW Social Worker Psychology  05/20/23   Court Pulling, MD Referring Physician Dermatology  12/02/23   Francyne Headland, MD Consulting Physician Cardiology  12/02/23     Chief Complaint  Patient presents with   Hospitalization Follow-up     Subjective:  Clayton [name]  is a 79 y.o. male presents for hospital follow up after recent left superior parathyroidectomy completed for hyperparathyroidism secondary to adenoma present.    *** 01/09/2024 FINAL MICROSCOPIC DIAGNOSIS:  A. PARATHYROID  ADENOMA, LEFT SUPERIOR, PARATHYROIDECTOMY:  - Cellular parathyroid  tissue weighing 0.537 g consistent with clinical  impression of adenoma.   No results for input(s): HGB, HCT, WBC, PLT in the last 168 hours.    Latest Ref Rng & Units 01/02/2024   10:36 AM 10/25/2023    1:42 PM 07/20/2023    2:34 PM  CMP  Glucose 70 - 99 mg/dL 880  887  93   BUN 8 - 23 mg/dL 26  22  30    Creatinine 0.61 - 1.24 mg/dL 8.53  8.59  8.55   Sodium 135 - 145  mmol/L 138  139  138   Potassium 3.5 - 5.1 mmol/L 4.8  4.7  4.3   Chloride 98 - 111 mmol/L 105  106  108   CO2 22 - 32 mmol/L 24  26  22    Calcium  8.9 - 10.3 mg/dL 88.8  89.4    89.6  89.4    10.9   Total Protein 6.0 - 8.3 g/dL  6.7  7.2   Total Bilirubin 0.2 - 1.2 mg/dL  1.1  1.0   Alkaline Phos 39 - 117 U/L  54  52   AST 0 - 37 U/L  31  27   ALT 0 - 53 U/L  45  32       No results found.      12/02/2023   10:34 AM 05/20/2023    9:40 AM 04/14/2022    7:56 AM 01/01/2022   10:25 AM 07/27/2021    9:04 AM  Depression screen PHQ 2/9  Decreased Interest 0 0 0 0 0  Down, Depressed, Hopeless 0 0 0 0 0  PHQ - 2 Score 0 0 0 0 0  Altered sleeping 0      Tired, decreased energy 0      Change in appetite 0      Feeling bad or failure about yourself  0      Trouble concentrating 0      Moving slowly or fidgety/restless 0  Suicidal thoughts 0      PHQ-9 Score 0      Difficult doing work/chores Not difficult at all        Allergies[1] Social History   Tobacco Use   Smoking status: Never   Smokeless tobacco: Never  Substance Use Topics   Alcohol use: Not Currently   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   CPAP (continuous positive airway pressure) dependence    Dysrhythmia    PAF- hx of 7 years ago    Gilbert's disease    History of colon cancer 06/08/2023   HLD (hyperlipidemia)    Hypertension 05/29/2010   ECHO-EF 67% wnl   Meniere disease    Obesity    OSA on CPAP 08/20/2012   Palpitations    S/P right total hip arthroplasty 08/12/2020   Squamous cell carcinoma of skin of neck    right; 2023   Stress 10/06/2021   Past Surgical History:  Procedure Laterality Date   ATRIAL FIBRILLATION ABLATION N/A 09/10/2020   Procedure: ATRIAL FIBRILLATION ABLATION;   Surgeon: Inocencio Soyla Lunger, MD;  Location: [facility];  Service: Cardiovascular;  Laterality: N/A;   CARDIAC CATHETERIZATION  11/03/2005   CARDIOVERSION N/A 09/26/2018   Procedure: CARDIOVERSION;  Surgeon: Barbaraann Darryle Ned, MD;  Location: [facility] ENDOSCOPY;  Service: Endoscopy;  Laterality: N/A;   CARDIOVERSION N/A 03/13/2019   Procedure: CARDIOVERSION;  Surgeon: Raford Riggs, MD;  Location: [facility] ENDOSCOPY;  Service: Cardiovascular;  Laterality: N/A;   CARDIOVERSION N/A 03/03/2021   Procedure: CARDIOVERSION;  Surgeon: Delford Maude BROCKS, MD;  Location: [facility] ENDOSCOPY;  Service: Cardiovascular;  Laterality: N/A;   CATARACT EXTRACTION  01/19/2012   KNEE ARTHROSCOPY Left    left knee- 2002    LAPAROSCOPIC PARTIAL RIGHT COLECTOMY Right 12/21/2021   Procedure: LAPAROSCOPIC RIGHT HEMICOLECTOMY;  Surgeon: Teresa Lonni HERO, MD;  Location: [facility] ORS;  Service: General;  Laterality: Right;   PARATHYROIDECTOMY Left 01/09/2024   Procedure: PARATHYROIDECTOMY;  Surgeon: Eletha Boas, MD;  Location: [facility] ORS;  Service: General;  Laterality: Left;  LEFT SUPEROR PARATHYROIDECTOMY   TOTAL HIP ARTHROPLASTY  06/08/2011   Procedure: TOTAL HIP ARTHROPLASTY ANTERIOR APPROACH;  Surgeon: Donnice JONETTA Car, MD;  Location: [facility] ORS;  Service: Orthopedics;  Laterality: Left;   TOTAL HIP ARTHROPLASTY Right 08/12/2020   Procedure: TOTAL HIP ARTHROPLASTY ANTERIOR APPROACH;  Surgeon: Car Donnice, MD;  Location: [facility] ORS;  Service: Orthopedics;  Laterality: Right;   Family History  Problem Relation Age of Onset   Hypertension Mother    Stroke Mother    Diabetes Mother    Hyperlipidemia Mother    Obesity Mother    Arthritis Father    Heart disease Father    Other Father        gilberts   Diabetes Sister    Stroke Maternal Grandmother        66 died    Heart disease Maternal Grandfather    Hypertension Maternal Grandfather    Diabetes Maternal Grandfather    Heart disease Paternal Grandfather    Allergies as  of 01/31/2024   No Known Allergies      Medication List        Accurate as of January 31, 2024  1:28 PM. If you have any questions,  ask your nurse or doctor.          amLODipine  10 MG tablet Commonly known as: NORVASC  TAKE 1 TABLET BY MOUTH EVERY DAY   apixaban  5 MG Tabs tablet Commonly known as: Eliquis  Take 1 tablet (5 mg total) by mouth 2 (two) times daily.   Coenzyme Q10 200 MG capsule Take 200 mg by mouth at bedtime.   ezetimibe  10 MG tablet Commonly known as: ZETIA  TAKE 1 TABLET (10 MG TOTAL) BY MOUTH IN THE MORNING   metFORMIN  500 MG tablet Commonly known as: GLUCOPHAGE  TAKE 1 TABLET BY MOUTH EVERY DAY WITH BREAKFAST   metoprolol  succinate 50 MG 24 hr tablet Commonly known as: TOPROL -XL TAKE 100 MG (2 TABLETS) BY MOUTH IN THE MORNING, AND 75 MG (1.5 TABLETS) IN THE EVENING.   MULTI-VITAMIN/MINERALS PO Take 2 tablets by mouth daily.   olmesartan  20 MG tablet Commonly known as: BENICAR  Take 1 tablet (20 mg total) by mouth daily.   Pitavastatin  Calcium  2 MG Tabs Take 1 tablet (2 mg total) by mouth daily. In the evening   spironolactone  25 MG tablet Commonly known as: ALDACTONE  Take 1 tablet (25 mg total) by mouth daily.   tamsulosin  0.4 MG Caps capsule Commonly known as: FLOMAX  Take 0.4 mg by mouth in the morning.   traMADol  50 MG tablet Commonly known as: ULTRAM  Take 1-2 tablets (50-100 mg total) by mouth every 6 (six) hours as needed for moderate pain (pain score 4-6).   triamcinolone  cream 0.5 % Commonly known as: KENALOG  APPLY 1 APPLICATION TOPICALLY 2 (TWO) TIMES DAILY. TO AFFECTED AREAS. What changed:  how much to take when to take this reasons to take this additional instructions   VITAMIN B12 PO Take 1 Piece by mouth daily.   ZINC 15 PO Take 1 Piece by mouth daily.        All past medical history, surgical history, allergies, family history, immunizations and medications were updated in the EMR today and reviewed under the  history and medication portions of their EMR.      ROS: Negative, with the exception of above mentioned in HPI   Objective:  BP 128/78   Pulse 75   Temp 98.2 F (36.8 C)   Wt 271 lb (122.9 kg)   SpO2 98%   BMI 33.87 kg/m  Body mass index is 33.87 kg/m. Gen: Afebrile. No acute distress. Nontoxic in appearance, well developed, well nourished.  HENT: AT. [place]. Bilateral TM visualized ***. MMM, no oral lesions. Bilateral nares ***. Throat without erythema or exudates. *** Eyes:Pupils Equal Round Reactive to light, Extraocular movements intact,  Conjunctiva without redness, discharge or icterus. Neck/lymp/endocrine: Supple,*** lymphadenopathy CV: RRR ***, ***edema Chest: CTAB, no wheeze or crackles. Good air movement, normal resp effort.  Abd: Soft. ***. NTND. BS ***. *** Masses palpated. No rebound or guarding. *** MSK: *** Skin: *** rashes, purpura or petechiae.  Neuro: *** Normal gait. PERLA. EOMi. Alert. Oriented x3 Cranial nerves II through XII intact. Muscle strength 5/5 *** extremity. DTRs equal bilaterally. Psych: Normal affect, dress and demeanor. Normal speech. Normal thought content and judgment.    Assessment/Plan: Clayton [name] is a 79 y.o. male present for OV for Hospital discharge follow up *** Reviewed expectations re: course of current medical issues. Discussed self-management of symptoms. Outlined signs and symptoms indicating need for more acute intervention. Patient verbalized understanding and all questions were answered. Patient received an After-Visit Summary. Any changes in medications were reviewed and patient was provided with  updated med list with their AVS.     No orders of the defined types were placed in this encounter.    Note is dictated utilizing voice recognition software. Although note has been proof read prior to signing, occasional typographical errors still can be missed. If any questions arise, please do not hesitate to call for  verification.   electronically signed by:  Charlies Bellini, DO  [facility] - [place]       [1] No Known Allergies  "

## 2024-02-01 DIAGNOSIS — Z9889 Other specified postprocedural states: Secondary | ICD-10-CM | POA: Insufficient documentation

## 2024-02-08 ENCOUNTER — Other Ambulatory Visit: Payer: Self-pay | Admitting: Cardiovascular Disease

## 2024-02-13 ENCOUNTER — Encounter: Payer: Self-pay | Admitting: Cardiovascular Disease

## 2024-02-14 MED ORDER — OLMESARTAN MEDOXOMIL 20 MG PO TABS: 20.0000 mg | ORAL_TABLET | Freq: Every day | ORAL | 3 refills | Status: AC

## 2024-02-15 MED ORDER — SPIRONOLACTONE 25 MG PO TABS: 25.0000 mg | ORAL_TABLET | Freq: Every day | ORAL | 3 refills | Status: AC

## 2024-02-15 NOTE — Telephone Encounter (Signed)
 Labs on 01/02/24 Outside of Normal  In accordance with refill protocols, please review and address the following requirements before this medication refill can be authorized:  Labs

## 2024-02-22 NOTE — Progress Notes (Unsigned)
 " Clayton [name] [facility] [address] [place] [address] Phone: [phone] Subjective:   Clayton [name], am serving as a scribe for Dr. Arthea [name].  I'm seeing this patient by the request  of:  Kuneff, Renee A, DO  CC: Back and neck pain follow-up  YEP:Dlagzrupcz  EDDI [name] is a 79 y.o. male coming in with complaint of back and neck pain. OMT on 12/29/2023. Patient states that he is doing much better after having surgery on parathyroid .   R groin pain. Hx of B THR.  Painful to perform hip flexion.   Also c/o stiffness in both legs.   Medications patient has been prescribed:   Taking:         Reviewed prior external information including notes and imaging from previsou exam, outside providers and external EMR if available.   As well as notes that were available from care everywhere and other healthcare systems.  Past medical history, social, surgical and family history all reviewed in electronic medical record.  No pertanent information unless stated regarding to the chief complaint.   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   CPAP (continuous positive airway pressure) dependence    Dysrhythmia    PAF- hx of 7 years ago    Gilbert's disease    History of colon cancer 06/08/2023   HLD (hyperlipidemia)    Hypertension 05/29/2010   ECHO-EF 67% wnl   Meniere disease    Obesity    OSA on CPAP 08/20/2012   Palpitations    S/P right total hip arthroplasty 08/12/2020   Squamous cell carcinoma of skin of neck    right; 2023   Stress 10/06/2021    Allergies[1]   Review of Systems:  No headache, visual changes, nausea, vomiting, diarrhea, constipation, dizziness, abdominal pain, skin rash, fevers, chills, night sweats, weight loss,  swollen lymph nodes, body aches, joint swelling, chest pain, shortness of breath, mood changes. POSITIVE muscle aches  Objective  Blood pressure (!) 142/90, height 6' 3 (1.905 m), weight 271 lb (122.9 kg).   General: No apparent distress alert and oriented x3 mood and affect normal, dressed appropriately.  HEENT: Pupils equal, extraocular movements intact  Respiratory: Patient's speak in full sentences and does not appear short of breath  Cardiovascular: No lower extremity edema, non tender, no erythema  Gait MSK:  Back does have some loss of lordosis.  Tenderness to palpation in the paraspinal musculature.  More tightness noted in the thoracolumbar region previously.  Tightness around the right sacroiliac joint.  Some limited range of motion of the hip with internal range of motion.  This is the right side.  Osteopathic findings  C2 flexed rotated and side bent right C6 flexed rotated and side bent left T3 extended rotated and side bent right inhaled rib T9 extended rotated and side bent left L2 flexed rotated and side bent right L3 flexed rotated and side bent left Sacrum right on right       Assessment and Plan:  Degenerative disc disease, lumbar Known degenerative disc disease.  Patient is getting over the pain at the moment.  Discussed with patient about icing regimen.  Do think that with patient having some of the right hip pain we will get  x-rays and will consider the possibility of a bone scan secondary to this being a replacement.  Increase activity slowly.  Discussed icing regimen and home exercises.  Follow-up again in 6 to 12 weeks    Primary hyperparathyroidism Recheck patient's thyroid .    Nonallopathic problems  Decision today to treat with OMT was based on Physical Exam  After verbal consent patient was treated with HVLA, ME, FPR techniques in cervical, rib, thoracic, lumbar, and sacral  areas  Patient tolerated the procedure well with improvement in  symptoms  Patient given exercises, stretches and lifestyle modifications  See medications in patient instructions if given  Patient will follow up in 4-8 weeks     The above documentation has been reviewed and is accurate and complete [name] [name], DO         Note: This dictation was prepared with Dragon dictation along with smaller phrase technology. Any transcriptional errors that result from this process are unintentional.            [1] No Known Allergies  "

## 2024-02-23 ENCOUNTER — Ambulatory Visit: Admitting: Family Medicine

## 2024-02-23 ENCOUNTER — Encounter: Payer: Self-pay | Admitting: Family Medicine

## 2024-02-23 ENCOUNTER — Ambulatory Visit

## 2024-02-23 ENCOUNTER — Other Ambulatory Visit: Payer: Self-pay

## 2024-02-23 ENCOUNTER — Ambulatory Visit: Payer: Self-pay | Admitting: Family Medicine

## 2024-02-23 VITALS — BP 142/90 | Ht 75.0 in | Wt 271.0 lb

## 2024-02-23 DIAGNOSIS — M9901 Segmental and somatic dysfunction of cervical region: Secondary | ICD-10-CM | POA: Diagnosis not present

## 2024-02-23 DIAGNOSIS — M9903 Segmental and somatic dysfunction of lumbar region: Secondary | ICD-10-CM | POA: Diagnosis not present

## 2024-02-23 DIAGNOSIS — M545 Low back pain, unspecified: Secondary | ICD-10-CM

## 2024-02-23 DIAGNOSIS — R5383 Other fatigue: Secondary | ICD-10-CM | POA: Diagnosis not present

## 2024-02-23 DIAGNOSIS — E210 Primary hyperparathyroidism: Secondary | ICD-10-CM

## 2024-02-23 DIAGNOSIS — M9902 Segmental and somatic dysfunction of thoracic region: Secondary | ICD-10-CM

## 2024-02-23 DIAGNOSIS — I480 Paroxysmal atrial fibrillation: Secondary | ICD-10-CM

## 2024-02-23 DIAGNOSIS — M9908 Segmental and somatic dysfunction of rib cage: Secondary | ICD-10-CM

## 2024-02-23 DIAGNOSIS — M25551 Pain in right hip: Secondary | ICD-10-CM

## 2024-02-23 DIAGNOSIS — M9904 Segmental and somatic dysfunction of sacral region: Secondary | ICD-10-CM | POA: Diagnosis not present

## 2024-02-23 DIAGNOSIS — M51362 Other intervertebral disc degeneration, lumbar region with discogenic back pain and lower extremity pain: Secondary | ICD-10-CM

## 2024-02-23 LAB — TSH: TSH: 1.42 u[IU]/mL (ref 0.35–5.50)

## 2024-02-23 LAB — T3, FREE: T3, Free: 3.4 pg/mL (ref 2.3–4.2)

## 2024-02-23 LAB — T4, FREE: Free T4: 0.66 ng/dL (ref 0.60–1.60)

## 2024-02-23 MED ORDER — APIXABAN 5 MG PO TABS: 5.0000 mg | ORAL_TABLET | Freq: Two times a day (BID) | ORAL | 1 refills | Status: AC

## 2024-02-23 NOTE — Assessment & Plan Note (Signed)
 Recheck patient's thyroid .

## 2024-02-23 NOTE — Patient Instructions (Signed)
Xray today Labs today See me again in 6-8 weeks

## 2024-02-23 NOTE — Assessment & Plan Note (Signed)
 Known degenerative disc disease.  Patient is getting over the pain at the moment.  Discussed with patient about icing regimen.  Do think that with patient having some of the right hip pain we will get x-rays and will consider the possibility of a bone scan secondary to this being a replacement.  Increase activity slowly.  Discussed icing regimen and home exercises.  Follow-up again in 6 to 12 weeks

## 2024-03-13 ENCOUNTER — Ambulatory Visit: Admitting: Psychology

## 2024-03-14 ENCOUNTER — Ambulatory Visit

## 2024-04-05 ENCOUNTER — Ambulatory Visit: Admitting: Family Medicine

## 2024-05-31 ENCOUNTER — Encounter: Admitting: Family Medicine
